# Patient Record
Sex: Male | Born: 1935
Health system: Southern US, Community
[De-identification: ages and names within clinical notes are randomized; demographics above are authoritative.]

## PROBLEM LIST (undated history)

## (undated) DIAGNOSIS — N200 Calculus of kidney: Secondary | ICD-10-CM

## (undated) DIAGNOSIS — C801 Malignant (primary) neoplasm, unspecified: Secondary | ICD-10-CM

## (undated) DIAGNOSIS — E119 Type 2 diabetes mellitus without complications: Secondary | ICD-10-CM

## (undated) DIAGNOSIS — I10 Essential (primary) hypertension: Secondary | ICD-10-CM

## (undated) DIAGNOSIS — I639 Cerebral infarction, unspecified: Secondary | ICD-10-CM

## (undated) HISTORY — DX: Calculus of kidney: N20.0

## (undated) HISTORY — PX: SHOULDER SURGERY: SHX246

## (undated) HISTORY — PX: CARDIAC CATHETERIZATION: SHX172

## (undated) HISTORY — PX: PROSTATE SURGERY: SHX751

## (undated) HISTORY — PX: OTHER SURGICAL HISTORY: SHX169

## (undated) HISTORY — PX: KNEE ARTHROSCOPY: SUR90

## (undated) HISTORY — DX: Type 2 diabetes mellitus without complications: E11.9

## (undated) HISTORY — DX: Cerebral infarction, unspecified: I63.9

---

## 1998-12-16 ENCOUNTER — Encounter: Payer: Self-pay | Admitting: Endocrinology

## 1998-12-16 ENCOUNTER — Ambulatory Visit (HOSPITAL_COMMUNITY): Admission: RE | Admit: 1998-12-16 | Discharge: 1998-12-16 | Payer: Self-pay | Admitting: Endocrinology

## 1999-05-26 ENCOUNTER — Encounter: Payer: Self-pay | Admitting: Endocrinology

## 1999-05-26 ENCOUNTER — Ambulatory Visit (HOSPITAL_COMMUNITY): Admission: RE | Admit: 1999-05-26 | Discharge: 1999-05-26 | Payer: Self-pay | Admitting: Endocrinology

## 1999-06-11 ENCOUNTER — Ambulatory Visit (HOSPITAL_COMMUNITY): Admission: RE | Admit: 1999-06-11 | Discharge: 1999-06-11 | Payer: Self-pay | Admitting: Endocrinology

## 1999-08-26 ENCOUNTER — Encounter: Admission: RE | Admit: 1999-08-26 | Discharge: 1999-11-24 | Payer: Self-pay | Admitting: Endocrinology

## 1999-09-26 ENCOUNTER — Ambulatory Visit (HOSPITAL_COMMUNITY): Admission: RE | Admit: 1999-09-26 | Discharge: 1999-09-26 | Payer: Self-pay | Admitting: Neurosurgery

## 1999-09-26 ENCOUNTER — Encounter: Payer: Self-pay | Admitting: Endocrinology

## 2002-05-22 ENCOUNTER — Encounter: Payer: Self-pay | Admitting: Urology

## 2002-05-22 ENCOUNTER — Encounter: Admission: RE | Admit: 2002-05-22 | Discharge: 2002-05-22 | Payer: Self-pay | Admitting: Urology

## 2003-03-12 ENCOUNTER — Encounter: Admission: RE | Admit: 2003-03-12 | Discharge: 2003-03-12 | Payer: Self-pay | Admitting: Orthopedic Surgery

## 2003-09-20 ENCOUNTER — Ambulatory Visit (HOSPITAL_COMMUNITY): Admission: RE | Admit: 2003-09-20 | Discharge: 2003-09-20 | Payer: Self-pay | Admitting: Cardiology

## 2004-12-08 ENCOUNTER — Encounter: Admission: RE | Admit: 2004-12-08 | Discharge: 2004-12-08 | Payer: Self-pay | Admitting: Endocrinology

## 2004-12-15 ENCOUNTER — Encounter: Admission: RE | Admit: 2004-12-15 | Discharge: 2004-12-15 | Payer: Self-pay | Admitting: Endocrinology

## 2004-12-23 ENCOUNTER — Encounter: Admission: RE | Admit: 2004-12-23 | Discharge: 2004-12-23 | Payer: Self-pay | Admitting: Endocrinology

## 2005-08-20 ENCOUNTER — Encounter: Admission: RE | Admit: 2005-08-20 | Discharge: 2005-08-20 | Payer: Self-pay | Admitting: Endocrinology

## 2006-08-12 ENCOUNTER — Encounter: Admission: RE | Admit: 2006-08-12 | Discharge: 2006-08-12 | Payer: Self-pay | Admitting: Endocrinology

## 2006-08-18 ENCOUNTER — Encounter: Admission: RE | Admit: 2006-08-18 | Discharge: 2006-08-18 | Payer: Self-pay | Admitting: Endocrinology

## 2006-12-21 ENCOUNTER — Ambulatory Visit (HOSPITAL_COMMUNITY): Admission: RE | Admit: 2006-12-21 | Discharge: 2006-12-21 | Payer: Self-pay | Admitting: *Deleted

## 2006-12-21 ENCOUNTER — Encounter (INDEPENDENT_AMBULATORY_CARE_PROVIDER_SITE_OTHER): Payer: Self-pay | Admitting: *Deleted

## 2008-03-16 ENCOUNTER — Ambulatory Visit (HOSPITAL_COMMUNITY): Admission: RE | Admit: 2008-03-16 | Discharge: 2008-03-16 | Payer: Self-pay | Admitting: Orthopedic Surgery

## 2008-03-26 ENCOUNTER — Ambulatory Visit (HOSPITAL_COMMUNITY): Admission: RE | Admit: 2008-03-26 | Discharge: 2008-03-26 | Payer: Self-pay | Admitting: Rheumatology

## 2008-04-16 ENCOUNTER — Encounter: Admission: RE | Admit: 2008-04-16 | Discharge: 2008-04-16 | Payer: Self-pay | Admitting: Endocrinology

## 2008-04-30 ENCOUNTER — Encounter: Admission: RE | Admit: 2008-04-30 | Discharge: 2008-04-30 | Payer: Self-pay | Admitting: Endocrinology

## 2008-05-18 ENCOUNTER — Ambulatory Visit (HOSPITAL_COMMUNITY): Admission: RE | Admit: 2008-05-18 | Discharge: 2008-05-18 | Payer: Self-pay | Admitting: *Deleted

## 2008-06-20 ENCOUNTER — Ambulatory Visit (HOSPITAL_BASED_OUTPATIENT_CLINIC_OR_DEPARTMENT_OTHER): Admission: RE | Admit: 2008-06-20 | Discharge: 2008-06-20 | Payer: Self-pay | Admitting: General Surgery

## 2008-06-20 ENCOUNTER — Encounter (INDEPENDENT_AMBULATORY_CARE_PROVIDER_SITE_OTHER): Payer: Self-pay | Admitting: General Surgery

## 2010-04-29 ENCOUNTER — Ambulatory Visit (HOSPITAL_COMMUNITY)
Admission: RE | Admit: 2010-04-29 | Discharge: 2010-04-30 | Payer: Self-pay | Source: Home / Self Care | Attending: Cardiology | Admitting: Cardiology

## 2010-05-22 ENCOUNTER — Encounter
Admission: RE | Admit: 2010-05-22 | Discharge: 2010-05-22 | Payer: Self-pay | Source: Home / Self Care | Attending: Gastroenterology | Admitting: Gastroenterology

## 2010-05-25 ENCOUNTER — Encounter: Payer: Self-pay | Admitting: Cardiology

## 2010-07-08 ENCOUNTER — Inpatient Hospital Stay (HOSPITAL_COMMUNITY): Payer: Medicare Other

## 2010-07-08 ENCOUNTER — Other Ambulatory Visit: Payer: Self-pay | Admitting: General Surgery

## 2010-07-08 ENCOUNTER — Observation Stay (HOSPITAL_COMMUNITY)
Admission: RE | Admit: 2010-07-08 | Discharge: 2010-07-08 | Disposition: A | Discharge status: Home / Self Care | Payer: Medicare Other | Source: Ambulatory Visit | Attending: General Surgery | Admitting: General Surgery

## 2010-07-08 DIAGNOSIS — I2510 Atherosclerotic heart disease of native coronary artery without angina pectoris: Secondary | ICD-10-CM | POA: Insufficient documentation

## 2010-07-08 DIAGNOSIS — R110 Nausea: Secondary | ICD-10-CM | POA: Insufficient documentation

## 2010-07-08 DIAGNOSIS — K219 Gastro-esophageal reflux disease without esophagitis: Secondary | ICD-10-CM | POA: Insufficient documentation

## 2010-07-08 DIAGNOSIS — E119 Type 2 diabetes mellitus without complications: Secondary | ICD-10-CM | POA: Insufficient documentation

## 2010-07-08 DIAGNOSIS — E7800 Pure hypercholesterolemia, unspecified: Secondary | ICD-10-CM | POA: Insufficient documentation

## 2010-07-08 DIAGNOSIS — I10 Essential (primary) hypertension: Secondary | ICD-10-CM | POA: Insufficient documentation

## 2010-07-08 DIAGNOSIS — K8010 Calculus of gallbladder with chronic cholecystitis without obstruction: Principal | ICD-10-CM | POA: Insufficient documentation

## 2010-07-08 LAB — CBC
MCV: 90.1 fL (ref 78.0–100.0)
RDW: 13.1 % (ref 11.5–15.5)
WBC: 7.7 10*3/uL (ref 4.0–10.5)

## 2010-07-08 LAB — PROTIME-INR
INR: 1.02 (ref 0.00–1.49)
Prothrombin Time: 13.6 seconds (ref 11.6–15.2)

## 2010-07-08 LAB — COMPREHENSIVE METABOLIC PANEL
AST: 15 U/L (ref 0–37)
Albumin: 4 g/dL (ref 3.5–5.2)
BUN: 20 mg/dL (ref 6–23)
GFR calc Af Amer: 50 mL/min — ABNORMAL LOW (ref >60)
GFR calc non Af Amer: 41 mL/min — ABNORMAL LOW (ref >60)
Glucose, Bld: 152 mg/dL — ABNORMAL HIGH (ref 70–99)
Potassium: 5.2 mEq/L — ABNORMAL HIGH (ref 3.5–5.1)
Sodium: 141 mEq/L (ref 135–145)
Total Bilirubin: 0.8 mg/dL (ref 0.3–1.2)

## 2010-07-08 LAB — DIFFERENTIAL
Basophils Absolute: 0 10*3/uL (ref 0.0–0.1)
Basophils Relative: 0 % (ref 0–1)
Eosinophils Absolute: 0.3 10*3/uL (ref 0.0–0.7)
Eosinophils Relative: 3 % (ref 0–5)
Lymphs Abs: 1.7 10*3/uL (ref 0.7–4.0)

## 2010-07-08 LAB — TYPE AND SCREEN: Antibody Screen: NEGATIVE

## 2010-07-08 LAB — GLUCOSE, CAPILLARY

## 2010-07-08 LAB — SURGICAL PCR SCREEN: Staphylococcus aureus: NEGATIVE

## 2010-07-10 NOTE — Op Note (Signed)
NAME:  Tyler [name], Tyler NO.:  [id number]  MEDICAL RECORD NO.:  [id number]           PATIENT TYPE:  I  LOCATION:  2550                         FACILITY:  [facility]  PHYSICIAN:  Almond Lint, MD       DATE OF BIRTH:  [birth year]  DATE OF PROCEDURE:  07/08/2010 DATE OF DISCHARGE:  07/08/2010                              OPERATIVE REPORT   PREOPERATIVE DIAGNOSIS:  Symptomatic cholelithiasis.  POSTOPERATIVE DIAGNOSIS:  Symptomatic cholelithiasis  PROCEDURE PERFORMED:  Laparoscopic cholecystectomy.  SURGEON:  Almond Lint, MD  ASSISTANT:  None.  ANESTHESIA:  General and local.  FINDINGS:  Mild inflammation of gallbladder and distended gallbladder.  SPECIMEN:  Gallbladder to Pathology.  ESTIMATED BLOOD LOSS:  Minimal.  COMPLICATIONS:  None known.  DESCRIPTION OF PROCEDURE:  Mr. Tyler [name] was identified in the holding area and taken to operating room where he was placed supine on the operating table.  General anesthesia was induced.  His abdomen was clipped, prepped, and draped in sterile fashion.  Time-out was performed according to surgical safety check list.  When all was correct, we continued.  The infraumbilical skin was anesthetized with local anesthetic.  A vertical incision was made with #11 blade approximately 1.5 cm in length.  The subcutaneous tissues were spread with Tresa Endo and the umbilical fascia was elevated with 2 Kocher clamps.  This was incised in the midline.  A 0 Vicryl pursestring suture was placed around the fascial incision and the tails were used to hold Hasson trocar in place.  Pneumoperitoneum was achieved to a pressure of 15 mmHg.  The patient was placed into reverse Trendelenburg position and rotated to the left.  Under direct visualization after administration of local, the 11-mm trocar was placed in the epigastrium and two 5-mm trocars were placed in the right upper quadrant.  Gallbladder fundus was elevated toward the head and the  infundibulum was retracted laterally.  The [place] dissector was used to skeletonize the cystic duct and cystic artery.  The hook was used to open up some of peritoneal attachments demonstrating a critical view.  The cystic duct was triply clipped on the patient's side and singly clipped on the specimen side.  The cystic artery was doubly clipped on the patient's side and singly clipped on the specimen side.  There was an additional a very small venous- appearing branch which was clipped on the patient's side.  The hook cautery was then used to the gallbladder with the gallbladder fossa. The edema plane was such that the gallbladder with minimal retraction pulled off the liver bed.  Once the gallbladder was fully off the liver bed, the camera was moved to the epigastric port and the gallbladder was retrieved through the umbilical port was an EndoCatch bag.  The skin and fascial incisions had to be extended somewhat to be able to remove the gallbladder without the sac breaking.  Once this was accomplished, the Hasson trocar was reintroduced and pneumoperitoneum was retrieved.  The gallbladder fossa was examined and there was a small amount of oozing from the portion where the gallbladder had pulled off directly from the  liver.  This was coagulated with cautery.  The area was copiously irrigated and inspected meticulously for hemostasis.  Once the hemostasis had been achieved, this was re-irrigated and reinspected.  At this point, a 4-quadrant inspection was performed demonstrating minimal adhesions in the abdomen and no gross pathology.  There was no pooling of blood or significant amounts of irrigation in the remaining abdomen. Following this, the epigastric and right upper quadrant ports were removed under direct visualization without evidence of bleeding from the abdominal wall.  The pneumoperitoneum was allowed to fully evacuate through the Hasson.  The Hasson was then removed and the  pursestring suture was tied down at the umbilicus.  There was no residual palpable fascial defect.  The skin of all incisions was closed using 4-0 Monocryl in a running subcuticular fashion.  The wounds were then cleaned, dried, and dressed with Dermabond.  The patient was awakened from anesthesia and taken to PACU in stable condition.  Needle and sponge counts were correct.     Almond Lint, MD     FB/MEDQ  D:  07/08/2010  T:  07/09/2010  Job:  [id number]  cc:   Brooke Bonito, M.D.  Electronically Signed by Almond Lint MD on 07/09/2010 01:22:00 PM

## 2010-07-14 LAB — BASIC METABOLIC PANEL
BUN: 27 mg/dL — ABNORMAL HIGH (ref 6–23)
GFR calc non Af Amer: 39 mL/min — ABNORMAL LOW (ref >60)
GFR calc non Af Amer: 43 mL/min — ABNORMAL LOW (ref >60)
Glucose, Bld: 164 mg/dL — ABNORMAL HIGH (ref 70–99)
Glucose, Bld: 186 mg/dL — ABNORMAL HIGH (ref 70–99)
Potassium: 4.6 mEq/L (ref 3.5–5.1)
Potassium: 5.3 mEq/L — ABNORMAL HIGH (ref 3.5–5.1)
Sodium: 136 mEq/L (ref 135–145)

## 2010-07-14 LAB — GLUCOSE, CAPILLARY
Glucose-Capillary: 132 mg/dL — ABNORMAL HIGH (ref 70–99)
Glucose-Capillary: 135 mg/dL — ABNORMAL HIGH (ref 70–99)

## 2010-08-05 NOTE — Discharge Summary (Signed)
  NAME:  ACESON, LABELL NO.:  [id number]  MEDICAL RECORD NO.:  [id number]           PATIENT TYPE:  I  LOCATION:  2550                         FACILITY:  [facility]  PHYSICIAN:  Almond Lint, MD       DATE OF BIRTH:  [birth year]  DATE OF ADMISSION:  07/08/2010 DATE OF DISCHARGE:  07/08/2010                              DISCHARGE SUMMARY   PRINCIPAL DIAGNOSES:  Cholelithiasis and chronic cholecystitis.  PRINCIPAL PROCEDURE:  Laparoscopic cholecystectomy on July 08, 2010.  SECONDARY DIAGNOSES: 1. Diabetes. 2. Reflux. 3. Hypertension. 4. Hypercholesterolemia.  DISCHARGE MEDICATIONS: 1. Glimepiride 2 mg once a day. 2. Flaxseed oil 1000 mg once a day. 3. Acetaminophen p.r.n. 4. Vitamin B12 once a day. 5. Aspirin 81 mg once a day. 6. Vitamin D3 1000 units once a day. 7. Losartan 50 mg b.i.d. 8. Protonix 40 mg once a day. 9. Pravastatin 80 mg once a day. 10.Fish oil 1200 mg twice a day. 11.Percocet 5/325 one to two tablets p.o. q.4 hours p.r.n. pain.  LABORATORY DATA:  His labs on admission, CBC was normal.  CMET showed potassium of 5.2 and glucose of 152, creatinine of 1.63.  We did not get any followup labs on him.  HOSPITAL COURSE:  Tyler [name] was admitted to the hospital for nausea following his laparoscopic cholecystectomy.  He did very well the next morning and he is able to tolerated oral pain medications and oral narcotics.  He is able to void spontaneously.  He is discharged to home in improved condition.    Almond Lint, MD    FB/MEDQ  D:  07/18/2010  T:  07/19/2010  Job:  [id number]  Electronically Signed by Almond Lint MD on 08/05/2010 10:15:01 AM

## 2010-08-18 LAB — GLUCOSE, CAPILLARY: Glucose-Capillary: 134 mg/dL — ABNORMAL HIGH (ref 70–99)

## 2010-09-16 NOTE — Op Note (Signed)
NAME:  AMY, BELLOSO NO.:  [id number]   MEDICAL RECORD NO.:  [id number]          PATIENT TYPE:  AMB   LOCATION:  SDS                          FACILITY:  [facility]   PHYSICIAN:  Almedia Balls. Ranell Patrick, M.D. DATE OF BIRTH:  1928/06/05   DATE OF PROCEDURE:  03/16/2008  DATE OF DISCHARGE:  03/16/2008                               OPERATIVE REPORT   PREOPERATIVE DIAGNOSES:  1. Right shoulder rotator cuff tear.  2. Superior labrum anterior and posterior lesion.  3. Acromioclavicular joint arthritis.   POSTOPERATIVE DIAGNOSES:  1. Right shoulder rotator cuff tear.  2. Superior labrum anterior and posterior lesion.  3. Acromioclavicular joint arthritis.   PROCEDURE PERFORMED:  Right shoulder arthroscopy with extensive  debridement of superior labrum anterior and posterior lesion,  arthroscopic biceps tenotomy, arthroscopic cervical decompression, mini  open rotator cuff repair, open biceps tenodesis using Panalok anchor,  open distal clavicle excision.   ATTENDING SURGEON:  Almedia Balls. Ranell Patrick, MD   ASSISTANT:  Donnie Coffin. Dixon, PA-C   ANESTHESIA:  General anesthesia plus interscalene block anesthesia was  used.   ESTIMATED BLOOD LOSS:  Minimal.   FLUID REPLACEMENT:  1200 mL crystalloid.   INSTRUMENT COUNT:  Correct.   COMPLICATIONS:  None.   Preoperative antibiotics were given.   INDICATIONS:  The patient is a 77 year old male with a history of right  shoulder rotator cuff tear.  He had impingement as well as symptomatic  AC joint arthritis with progressive pain and functional loss.  The  patient presents now for operative treatment desiring a shoulder surgery  to restore function and eliminate pain.  Informed consent was obtained.   DESCRIPTION OF PROCEDURE:  After an adequate level of anesthesia was  achieved, the patient was positioned in the modified beach chair  position.  The right shoulder was sterilely prepped and draped in the  usual manner.  We entered  the shoulder arthroscopically using standard  arthroscopic  portals including anterior, posterior, and lateral  portals.  We identified a torn superior labrum, anterior to posterior  with unstable biceps anchor.  We performed a labral debridement using  basket forceps and a motorized shaver.  The patient had a Buford complex  as well.  We debrided back stable labral tissue.  The biceps was  tenotomized using basket forceps.  The articular surface on the glenoid  and humeral head showed minimal grade 1 and grade 2 chondromalacia.  The  subscap was normal.  The rotator cuff had an undersurface tear.  Less  than 25% of thickness to the tendon of supraspinatus only, performed a  debridement of that using motorized shaver.  Infraspinatus and teres  minor were normal.  Posterior labrum intact.  Except for the very  posteroinferior portion of the labrum, which had a degenerative  nondisplaced tear, which we shaved using the shaver.  We evacuated all  the loose cartilage fragments.  We placed the scope in subacromial  space, performed bursectomy and acromioplasty creating a type 1 acromial  shape with thorough decompression of the rotator cuff outlet.  The  rotator cuff appeared  fairly normal from the bursal surface.  At this  point, we made a saber incision overlying the AC joint.  Dissection  carried sharply down through the subcutaneous tissues.  We identified  the deltoid-trapezius fascia, split that line with a distal clavicle  followed by subperiosteal dissection of distal clavicle.  We excised the  distal 4 mm of the clavicle using an oscillating saw.  Thoroughly  irrigated, applied bone wax at the cut into clavicle and then repaired  the deltoid-trapezius fascia using 0 Vicryl figure-of-eight sutures  followed by 2-0 Vicryl subcutaneous closure with 4-0 Monocryl for skin.  We then made a small mini open incisions starting at the anterolateral  border of the acromion extending distally for  about 3-4 cm.  Dissection  carried sharply down through the subcutaneous tissues.  Split the  deltoid in its raphe between the anterior and lateral heads.  We  identified the bicipital groove, incising soft tissue overlying the  groove, delivered the tendon into the wound.  Identified the tenodesis  point in the bicipital groove and then whip stitched the biceps with #2  FiberWire suture.  We then placed a single 3.5 Panalok anchoring the  floor of the bicipital groove.  Brought the sutures up through the whip  stitched area with #2 FiberWire and biceps tendon.  We tied the tendon  down in the groove.  We then went and clipped the remaining biceps  tendon off and then took a 0 Vicryl on a CT-2 needle and then oversewed  the soft tissue over the top of the biceps tenodesis.  This was a nice  low profile repair.  We did that with elbow at mid tension with the  elbow at 90 degrees.  We then went ahead and approached the rotator  cuff.  We noted there would be a definite defect in the posterior aspect  of the supraspinatus tendon, made a longitudinal incision in the tendon.  Identified immediately degenerative area of the tendon with nonhealthy-  looking tissue.  We removed all that tissue and then did a side-to-side  rotator cuff repair with a single 5.5 Arthrex Bio-Corkscrew anchor at  the articular margin followed by side-to-side #2 FiberWire suture and  some side-to-side 0 Vicryl figure-of-eight at the most lateral margin.  We had a nice low profile repair and again this was non-impinging with  the acromion, took this through full range of motion.  We thoroughly  irrigated the repair site and repaired the deltoid with 0 Vicryl suture  side-to-side followed by 2-0 Vicryl subcutaneous closure with 4-0  Monocryl for skin.  Steri-Strips were applied followed by a sterile  dressing.  The patient tolerated the surgery well.      Almedia Balls. Ranell Patrick, M.D.  Electronically Signed     SRN/MEDQ   D:  03/16/2008  T:  03/17/2008  Job:  [id number]

## 2010-09-16 NOTE — Op Note (Signed)
NAME:  Tyler [name], Tyler [name] NO.:  [id number]   MEDICAL RECORD NO.:  [id number]          PATIENT TYPE:  AMB   LOCATION:  ENDO                         FACILITY:  [facility]   PHYSICIAN:  Georgiana Spinner, M.D.    DATE OF BIRTH:  05/27/1928   DATE OF PROCEDURE:  12/21/2006  DATE OF DISCHARGE:                               OPERATIVE REPORT   PROCEDURE:  Colonoscopy.   INDICATIONS:  Colon cancer screening, colon polyps.   ANESTHESIA:  Fentanyl 25 mcg, Versed 2.5 mg.   PROCEDURE:  With the patient mildly sedated in the left lateral  decubitus position, the Pentax videoscopic colonoscope was inserted into  the rectum and passed under direct vision to the cecum identified by  ileocecal valve and appendiceal orifice, both of which were  photographed.  From this point, the colonoscope was slowly withdrawn,  taking circumferential views of colonic mucosa, as we withdrew all the  way to the rectum, stopping first at the ileocecal valve area where a  small polyp was seen, photographed and removed using hot biopsy forceps  technique setting of 20/150 blended current.  The endoscope was then  withdrawn all the way to the splenic flexure where a second polyp was  seen.  It too was photographed, and it too was removed using hot biopsy  forceps technique setting of 20/150 blended current.  We then withdrew  all the way to the rectum which appeared normal on direct and showed  hemorrhoids on retroflexed view.  The endoscope was straightened and  withdrawn.  The patient's vital signs and pulse oximeter remained  stable.  The patient tolerated the procedure well without apparent  complications.   FINDINGS:  Polyp of the ileocecal valve area and splenic flexure area,  biopsied and removed with hot biopsy forceps technique.  Internal  hemorrhoids.  Await biopsy report.  The patient will call me for results  and follow-up with me as an outpatient.           ______________________________  Georgiana Spinner, M.D.     GMO/MEDQ  D:  12/21/2006  T:  12/21/2006  Job:  [id number]

## 2010-09-16 NOTE — Op Note (Signed)
NAME:  Tyler [name], Tyler [name] NO.:  [id number]   MEDICAL RECORD NO.:  [id number]          PATIENT TYPE:  AMB   LOCATION:  ENDO                         FACILITY:  [facility]   PHYSICIAN:  Georgiana Spinner, M.D.    DATE OF BIRTH:  1928/09/18   DATE OF PROCEDURE:  DATE OF DISCHARGE:                               OPERATIVE REPORT   PROCEDURE:  Upper endoscopy.   INDICATIONS:  Weight loss.   ANESTHESIA:  Fentanyl 50 mcg, Versed 5 mg.   DESCRIPTION OF PROCEDURE:  With the patient mildly sedated in the left  lateral decubitus position, the Pentax videoscopic endoscope was  inserted in the mouth and passed under direct vision through the  esophagus which appeared normal into the stomach fundus, body, antrum,  duodenal bulb, second portion of duodenum all appeared normal.  From  this point, the endoscope was slowly withdrawn taking circumferential  views of duodenal mucosa until the endoscope had been pulled back into  stomach and placed in retroflexion to view the stomach from below, and  the scope was straightened and withdrawn taking circumferential views of  remaining gastric and esophageal mucosa.  The patient's vital signs and  pulse oximeter remained stable.  The patient tolerated procedure well  without apparent complication.   FINDINGS:  Unremarkable examination.   PLAN:  Will have patient follow-up with me as an outpatient.           ______________________________  Georgiana Spinner, M.D.     GMO/MEDQ  D:  05/18/2008  T:  05/18/2008  Job:  [id number]

## 2010-09-16 NOTE — Op Note (Signed)
NAME:  Tyler [name], Tyler [name] NO.:  [id number]   MEDICAL RECORD NO.:  [id number]          PATIENT TYPE:  AMB   LOCATION:  ENDO                         FACILITY:  [facility]   PHYSICIAN:  Georgiana Spinner, M.D.    DATE OF BIRTH:  1928-10-09   DATE OF PROCEDURE:  12/21/2006  DATE OF DISCHARGE:                               OPERATIVE REPORT   PROCEDURE:  Upper endoscopy.   INDICATIONS:  Abdominal pain.   Fentanyl 50 mcg, Versed 5 mg.   PROCEDURE:  With the patient mildly sedated in the left lateral  decubitus position, the Pentax videoscopic endoscope was inserted and  passed under direct vision through the esophagus, which appeared normal,  into the stomach.  Fundus, body, antrum, duodenal bulb, second portion  of duodenum all appeared normal.  From this point the endoscope was  slowly withdrawn taking circumferential views of the duodenal mucosa  until the endoscope had been pulled back into the stomach, placed in  retroflexion to view the stomach from below.  The endoscope was  straightened and withdrawn taking circumferential views of the remaining  gastric and esophageal mucosa.  The patient's vital signs and pulse  oximeter remained stable.  The patient tolerated the procedure well  without apparent complications.   FINDINGS:  Unremarkable examination.   PLAN:  Proceed to colonoscopy.           ______________________________  Georgiana Spinner, M.D.     GMO/MEDQ  D:  12/21/2006  T:  12/21/2006  Job:  [id number]

## 2011-02-03 LAB — GLUCOSE, CAPILLARY: Glucose-Capillary: 214 — ABNORMAL HIGH

## 2011-02-03 LAB — URINALYSIS, ROUTINE W REFLEX MICROSCOPIC
Glucose, UA: 250 — AB
Nitrite: NEGATIVE
Protein, ur: NEGATIVE
pH: 5

## 2011-02-03 LAB — BASIC METABOLIC PANEL
GFR calc Af Amer: >60
GFR calc non Af Amer: >60
Potassium: 3.9
Sodium: 137

## 2011-02-03 LAB — CBC
HCT: 36.8 — ABNORMAL LOW
Hemoglobin: 12.6 — ABNORMAL LOW
RBC: 4.01 — ABNORMAL LOW
WBC: 14.8 — ABNORMAL HIGH

## 2011-02-03 LAB — DIFFERENTIAL
Eosinophils Relative: 1
Lymphocytes Relative: 19
Lymphs Abs: 2.8
Monocytes Absolute: 0.7

## 2011-02-03 LAB — APTT: aPTT: 21 — ABNORMAL LOW

## 2012-01-19 ENCOUNTER — Other Ambulatory Visit: Payer: Self-pay | Admitting: Dermatology

## 2012-01-28 ENCOUNTER — Inpatient Hospital Stay (HOSPITAL_COMMUNITY)
Admission: EM | Admit: 2012-01-28 | Discharge: 2012-02-02 | DRG: 065 | Disposition: A | Discharge status: Rehabilitation Facility | Payer: Medicare Other | Attending: Internal Medicine | Admitting: Internal Medicine

## 2012-01-28 ENCOUNTER — Encounter (HOSPITAL_COMMUNITY): Payer: Self-pay | Admitting: Emergency Medicine

## 2012-01-28 ENCOUNTER — Emergency Department (HOSPITAL_COMMUNITY): Payer: Medicare Other

## 2012-01-28 DIAGNOSIS — Z8673 Personal history of transient ischemic attack (TIA), and cerebral infarction without residual deficits: Secondary | ICD-10-CM | POA: Insufficient documentation

## 2012-01-28 DIAGNOSIS — I498 Other specified cardiac arrhythmias: Secondary | ICD-10-CM | POA: Diagnosis not present

## 2012-01-28 DIAGNOSIS — E119 Type 2 diabetes mellitus without complications: Secondary | ICD-10-CM | POA: Diagnosis present

## 2012-01-28 DIAGNOSIS — Z23 Encounter for immunization: Secondary | ICD-10-CM

## 2012-01-28 DIAGNOSIS — E785 Hyperlipidemia, unspecified: Secondary | ICD-10-CM | POA: Diagnosis present

## 2012-01-28 DIAGNOSIS — Z87891 Personal history of nicotine dependence: Secondary | ICD-10-CM

## 2012-01-28 DIAGNOSIS — Z7902 Long term (current) use of antithrombotics/antiplatelets: Secondary | ICD-10-CM

## 2012-01-28 DIAGNOSIS — E7800 Pure hypercholesterolemia, unspecified: Secondary | ICD-10-CM | POA: Diagnosis present

## 2012-01-28 DIAGNOSIS — I6340 Cerebral infarction due to embolism of unspecified cerebral artery: Principal | ICD-10-CM | POA: Diagnosis present

## 2012-01-28 DIAGNOSIS — I509 Heart failure, unspecified: Secondary | ICD-10-CM | POA: Diagnosis present

## 2012-01-28 DIAGNOSIS — N184 Chronic kidney disease, stage 4 (severe): Secondary | ICD-10-CM | POA: Diagnosis present

## 2012-01-28 DIAGNOSIS — K5900 Constipation, unspecified: Secondary | ICD-10-CM | POA: Diagnosis not present

## 2012-01-28 DIAGNOSIS — I5032 Chronic diastolic (congestive) heart failure: Secondary | ICD-10-CM | POA: Diagnosis present

## 2012-01-28 DIAGNOSIS — I129 Hypertensive chronic kidney disease with stage 1 through stage 4 chronic kidney disease, or unspecified chronic kidney disease: Secondary | ICD-10-CM | POA: Diagnosis present

## 2012-01-28 DIAGNOSIS — G8190 Hemiplegia, unspecified affecting unspecified side: Secondary | ICD-10-CM | POA: Diagnosis present

## 2012-01-28 DIAGNOSIS — Z79899 Other long term (current) drug therapy: Secondary | ICD-10-CM

## 2012-01-28 DIAGNOSIS — R4701 Aphasia: Secondary | ICD-10-CM | POA: Diagnosis present

## 2012-01-28 DIAGNOSIS — Z882 Allergy status to sulfonamides status: Secondary | ICD-10-CM

## 2012-01-28 DIAGNOSIS — Z8546 Personal history of malignant neoplasm of prostate: Secondary | ICD-10-CM

## 2012-01-28 DIAGNOSIS — R479 Unspecified speech disturbances: Secondary | ICD-10-CM

## 2012-01-28 DIAGNOSIS — I639 Cerebral infarction, unspecified: Secondary | ICD-10-CM

## 2012-01-28 HISTORY — DX: Essential (primary) hypertension: I10

## 2012-01-28 HISTORY — DX: Malignant (primary) neoplasm, unspecified: C80.1

## 2012-01-28 LAB — COMPREHENSIVE METABOLIC PANEL
Alkaline Phosphatase: 117 U/L (ref 39–117)
BUN: 26 mg/dL — ABNORMAL HIGH (ref 6–23)
GFR calc Af Amer: 45 mL/min — ABNORMAL LOW (ref >90)
GFR calc non Af Amer: 38 mL/min — ABNORMAL LOW (ref >90)
Glucose, Bld: 232 mg/dL — ABNORMAL HIGH (ref 70–99)
Potassium: 4.1 mEq/L (ref 3.5–5.1)
Total Protein: 6.9 g/dL (ref 6.0–8.3)

## 2012-01-28 LAB — DIFFERENTIAL
Eosinophils Absolute: 0.4 10*3/uL (ref 0.0–0.7)
Eosinophils Relative: 5 % (ref 0–5)
Lymphs Abs: 3.2 10*3/uL (ref 0.7–4.0)
Monocytes Relative: 6 % (ref 3–12)
Neutrophils Relative %: 50 % (ref 43–77)

## 2012-01-28 LAB — GLUCOSE, CAPILLARY

## 2012-01-28 LAB — CBC
Hemoglobin: 13.4 g/dL (ref 13.0–17.0)
MCH: 31.4 pg (ref 26.0–34.0)
MCV: 91.3 fL (ref 78.0–100.0)
RBC: 4.27 MIL/uL (ref 4.22–5.81)

## 2012-01-28 LAB — TROPONIN I: Troponin I: <0.3 ng/mL (ref <0.30)

## 2012-01-28 MED ORDER — SIMVASTATIN 5 MG PO TABS
5.0000 mg | ORAL_TABLET | Freq: Every day | ORAL | Status: DC
  Administered 2012-01-29 – 2012-02-01 (×4): 5 mg via ORAL
  Filled 2012-01-28 (×5): qty 1

## 2012-01-28 MED ORDER — PANTOPRAZOLE SODIUM 40 MG PO TBEC
40.0000 mg | DELAYED_RELEASE_TABLET | Freq: Every day | ORAL | Status: DC
  Administered 2012-01-29 – 2012-02-02 (×5): 40 mg via ORAL
  Filled 2012-01-28 (×6): qty 1

## 2012-01-28 MED ORDER — INFLUENZA VIRUS VACC SPLIT PF IM SUSP
0.5000 mL | INTRAMUSCULAR | Status: AC
  Administered 2012-01-29: 0.5 mL via INTRAMUSCULAR
  Filled 2012-01-28: qty 0.5

## 2012-01-28 MED ORDER — SENNOSIDES-DOCUSATE SODIUM 8.6-50 MG PO TABS: 1.0000 | ORAL_TABLET | Freq: Every evening | ORAL | Status: DC | PRN

## 2012-01-28 MED ORDER — ASPIRIN 300 MG RE SUPP
300.0000 mg | Freq: Every day | RECTAL | Status: DC
  Filled 2012-01-28: qty 1

## 2012-01-28 MED ORDER — OMEGA-3-ACID ETHYL ESTERS 1 G PO CAPS
1.0000 g | ORAL_CAPSULE | Freq: Every day | ORAL | Status: DC
  Administered 2012-01-29 – 2012-02-02 (×5): 1 g via ORAL
  Filled 2012-01-28 (×5): qty 1

## 2012-01-28 MED ORDER — ENOXAPARIN SODIUM 40 MG/0.4ML ~~LOC~~ SOLN
40.0000 mg | SUBCUTANEOUS | Status: DC
Start: 2012-01-29 — End: 2012-01-31
  Administered 2012-01-29 – 2012-01-30 (×2): 40 mg via SUBCUTANEOUS
  Filled 2012-01-28 (×4): qty 0.4

## 2012-01-28 MED ORDER — LOSARTAN POTASSIUM 50 MG PO TABS
50.0000 mg | ORAL_TABLET | Freq: Every day | ORAL | Status: DC
  Administered 2012-01-29: 50 mg via ORAL
  Filled 2012-01-28: qty 1

## 2012-01-28 MED ORDER — ASPIRIN 325 MG PO TABS
325.0000 mg | ORAL_TABLET | Freq: Every day | ORAL | Status: DC
  Administered 2012-01-29: 325 mg via ORAL
  Filled 2012-01-28: qty 1

## 2012-01-28 MED ORDER — INSULIN ASPART 100 UNIT/ML ~~LOC~~ SOLN
0.0000 [IU] | Freq: Three times a day (TID) | SUBCUTANEOUS | Status: DC
  Administered 2012-01-29 – 2012-01-30 (×2): 1 [IU] via SUBCUTANEOUS
  Administered 2012-01-30: 2 [IU] via SUBCUTANEOUS
  Administered 2012-01-31 (×2): 1 [IU] via SUBCUTANEOUS
  Administered 2012-02-01: 2 [IU] via SUBCUTANEOUS
  Administered 2012-02-01 – 2012-02-02 (×2): 1 [IU] via SUBCUTANEOUS
  Administered 2012-02-02: 2 [IU] via SUBCUTANEOUS

## 2012-01-28 MED ORDER — GLIMEPIRIDE 4 MG PO TABS
4.0000 mg | ORAL_TABLET | Freq: Every day | ORAL | Status: DC
  Administered 2012-01-29 – 2012-02-02 (×5): 4 mg via ORAL
  Filled 2012-01-28 (×6): qty 1

## 2012-01-28 NOTE — ED Provider Notes (Signed)
78 year old male had onset about 1715 difficulty speaking and her right facial droop. He does have history of prior stroke. On exam, he has mild right facial droop, mildly dysarthric speech, and weakness of his right leg with strength 4/5. There is no arm weakness identified and no pronator drift. He has been seen in conjunction with the neurology who will decide whether he should receive thrombolytic treatment.   Date: 01/29/2012  Rate: 75  Rhythm: normal sinus rhythm  QRS Axis: left  Intervals: PR prolonged  ST/T Wave abnormalities: normal  Conduction Disutrbances:first-degree A-V block  and left anterior fascicular block  Narrative Interpretation: Left axis deviation, left anterior fascicular block. Compared with ECG of 04/30/2010, no significant changes are seen.  Old EKG Reviewed: unchanged CRITICAL CARE Performed by: [email or web address]   Total critical care time: 60 minutes  Critical care time was exclusive of separately billable procedures and treating other patients.  Critical care was necessary to treat or prevent imminent or life-threatening deterioration.  Critical care was time spent personally by me on the following activities: development of treatment plan with patient and/or surrogate as well as nursing, discussions with consultants, evaluation of patient's response to treatment, examination of patient, obtaining history from patient or surrogate, ordering and performing treatments and interventions, ordering and review of laboratory studies, ordering and review of radiographic studies, pulse oximetry and re-evaluation of patient's condition.  I saw and evaluated the patient, reviewed the resident's note and I agree with the findings and plan.   Dione Booze, MD 01/29/12 Jacinta Shoe

## 2012-01-28 NOTE — Code Documentation (Signed)
Called to ED for Code Stroke.  78 year old patient who wife reports was normal at baseline and suddenly at 1730 developed slurred speech and right side weakness.  Patient was transported to hospital via EMS.  See ED notes for Code Stroke log numbers.  On arrival patient was alert and NAD.   NIHSS 2 with right facial droop and mild dysarthria.  States his right arm feels different but does not endorse numbness and motor exam is negative.  Patient is s/p basal cell carcinoma removal right lower leg last week - wife reports MD made 2 "passes" at the site.  DDI to site.  BP on arrival was 207/89 - lowered to 185/80 after few minutes.  Dr. Eilleen Kempf and Dr. Thad Ranger at bedside.  No tpa at this time- still have window to 2030.  Handoff to April, RN RRT night nurse and ED RN.

## 2012-01-28 NOTE — Consult Note (Signed)
Referring Physician: Preston Fleeting     Chief Complaint: Slurred  speech  HPI: HAI GRABE is an 78 y.o. male who was in his normal state of health until about 1730 when he was noted to have slurred speech.  At that time also complained that his right arm did not feel right. Was felt to have right upper extremity numbness and weakness.  EMS was called at that time and the patient was brought in as a code stroke.  On presentation the RUE symptoms had resolved but he continued to have slurred speech.    LSN: [id number] tPA Given: No: Minimal disability on exam  Past Medical History  Diagnosis Date  . Hypertension   . Cancer    - DM  - Hypercholesterolemia  - Reflux  Past Surgical History  Procedure Date  . Prostate surgery    - Surgery last week on LLE for basal cell carcinoma  History reviewed. No pertinent family history.  Social History:  reports that he quit smoking about 43 years ago. He does not have any smokeless tobacco history on file. He reports that he does not drink alcohol or use illicit drugs.  Allergies:  Allergies  Allergen Reactions  . Sulfa Drugs Cross Reactors Rash    Medications: I have reviewed the patient's current medications. Prior to Admission:  ASA, Vitamin D, Vitamin B12, Fish Oil, Flaxseed, Amaryl, Cozaar, Protonix, Pravachol No current outpatient prescriptions on file.  ROS: History obtained from the patient  General ROS: negative for - chills, fatigue, fever, night sweats, weight gain or weight loss Psychological ROS: negative for - behavioral disorder, hallucinations, memory difficulties, mood swings or suicidal ideation Ophthalmic ROS: negative for - blurry vision, double vision, eye pain or loss of vision ENT ROS: negative for - epistaxis, nasal discharge, oral lesions, sore throat, tinnitus or vertigo Allergy and Immunology ROS: negative for - hives or itchy/watery eyes Hematological and Lymphatic ROS: negative for - bleeding problems, bruising or  swollen lymph nodes Endocrine ROS: negative for - galactorrhea, hair pattern changes, polydipsia/polyuria or temperature intolerance Respiratory ROS: negative for - cough, hemoptysis, shortness of breath or wheezing Cardiovascular ROS: negative for - chest pain, dyspnea on exertion, edema or irregular heartbeat Gastrointestinal ROS: negative for - abdominal pain, diarrhea, hematemesis, nausea/vomiting or stool incontinence Genito-Urinary ROS: negative for - dysuria, hematuria, incontinence or urinary frequency/urgency Musculoskeletal ROS: negative for - joint swelling or muscular weakness Neurological ROS: as noted in HPI Dermatological ROS: negative for rash and skin lesion changes  Physical Examination: Blood pressure 180/75, pulse 77, temperature 98.1 F (36.7 C), temperature source Oral, resp. rate 16, SpO2 100.00%.  Neurologic Examination: Mental Status: Alert, oriented, thought content appropriate.  Speech fluent without evidence of aphasia.  Able to follow 3 step commands without difficulty. Cranial Nerves: II: Discs flat bilaterally; Visual fields grossly normal, pupils equal, round, reactive to light and accommodation III,IV, VI: ptosis not present, extra-ocular motions intact bilaterally V,VII: right facial droop, facial light touch sensation normal bilaterally VIII: hearing normal bilaterally IX,X: gag reflex present XI: bilateral shoulder shrug XII: midline tongue extension Motor: Right : Upper extremity   5/5 with drift  Left:     Upper extremity   5/5  Lower extremity   5/5     Lower extremity   5/5 Tone and bulk:normal tone throughout; no atrophy noted Sensory: Pinprick and light touch intact throughout, bilaterally Deep Tendon Reflexes: 2+ and symmetric in the upper extremities, 2+ at the right KJ, trace left KJ and  absent AJ's bilaterally Plantars: Right: upgoing   Left: downgoing Cerebellar: normal finger-to-nose, normal rapid alternating movements and normal  heel-to-shin test Gait: unable to test CV: pulses palpable throughout     Laboratory Studies:  Basic Metabolic Panel:  Lab 01/28/12 4098  NA 137  K 4.1  CL 103  CO2 22  GLUCOSE 232*  BUN 26*  CREATININE 1.61*  CALCIUM 9.5  MG --  PHOS --    Liver Function Tests:  Lab 01/28/12 1830  AST 16  ALT 16  ALKPHOS 117  BILITOT 0.3  PROT 6.9  ALBUMIN 3.8   No results found for this basename: LIPASE:5,AMYLASE:5 in the last 168 hours No results found for this basename: AMMONIA:3 in the last 168 hours  CBC:  Lab 01/28/12 1830  WBC 8.2  NEUTROABS 4.1  HGB 13.4  HCT 39.0  MCV 91.3  PLT 154    Cardiac Enzymes:  Lab 01/28/12 1830  CKTOTAL --  CKMB --  CKMBINDEX --  TROPONINI <0.30    BNP: No components found with this basename: POCBNP:5  CBG:  Lab 01/28/12 1850  GLUCAP 201*    Microbiology: Results for orders placed during the hospital encounter of 07/08/10  SURGICAL PCR SCREEN     Status: Normal   Collection Time   07/08/10  9:54 AM      Component Value Range Status Comment   MRSA, PCR NEGATIVE  NEGATIVE Final    Staphylococcus aureus    NEGATIVE Final    Value: NEGATIVE            The Xpert SA Assay (FDA     approved for NASAL specimens     only), is one component of     a comprehensive surveillance     program.  It is not intended     to diagnose infection nor to     guide or monitor treatment.    Coagulation Studies:  Basename 01/28/12 1830  LABPROT 13.1  INR 1.00    Urinalysis: No results found for this basename: COLORURINE:2,APPERANCEUR:2,LABSPEC:2,PHURINE:2,GLUCOSEU:2,HGBUR:2,BILIRUBINUR:2,KETONESUR:2,PROTEINUR:2,UROBILINOGEN:2,NITRITE:2,LEUKOCYTESUR:2 in the last 168 hours  Lipid Panel: No results found for this basename: chol, trig, hdl, cholhdl, vldl, ldlcalc    HgbA1C:  No results found for this basename: HGBA1C    Urine Drug Screen:   No results found for this basename: labopia, cocainscrnur, labbenz, amphetmu, thcu, labbarb     Alcohol Level: No results found for this basename: ETH:2 in the last 168 hours   Imaging: Ct Head (brain) Wo Contrast  01/28/2012  *RADIOLOGY REPORT*  Clinical Data: 78 year old male with right side weakness and facial droop.  Code stroke.  CT HEAD WITHOUT CONTRAST  Technique:  Contiguous axial images were obtained from the base of the skull through the vertex without contrast.  Comparison: Brain MRI 08/12/2006.  Findings: Visualized paranasal sinuses and mastoids are clear.  No acute orbit or scalp soft tissue findings. No acute osseous abnormality identified.  Chronic sulcal variation at the left superior frontal gyrus is stable.  Stable cerebral volume.  No ventriculomegaly. No evidence of cortically based acute infarction identified.  No acute intracranial hemorrhage identified.  No suspicious intracranial vascular hyperdensity.  Largely normal for age gray-white matter differentiation throughout the brain.  IMPRESSION: No acute cortically based infarct or acute intracranial abnormality identified.  Critical Value/emergent results were called by telephone at the time of interpretation on 01/28/2012 at 1847 hours to Dr. Aubery Lapping, who verbally acknowledged these results.   Original Report Authenticated By: Ulla Potash III,  M.D.     Assessment: 78 y.o. male with initial complaints of RUE weakness and slurred speech now with only slurred speech and right facial droop on exam.  Patient with multiple stroke risk factors.  No evidence on exam of large vessel ischemic event.  At this point risks of tPA outweigh possible benefits.  Discussion had with son.  Will continue to monitor while within the treatment window for change in status.    Stroke Risk Factors - diabetes mellitus, hyperlipidemia and hypertension  Plan: 1. HgbA1c, fasting lipid panel 2. MRI, MRA  of the brain without contrast 3. PT consult, OT consult, Speech consult 4. Echocardiogram 5. Carotid dopplers 6. Prophylactic  therapy-Antiplatelet med: Plavix - dose 75mg  daily 7. Risk factor modification 8. Telemetry monitoring 9. Frequent neuro checks  Case discussed with Dr. Lovey Newcomer, MD [facility] [phone] 01/28/2012, 7:51 PM

## 2012-01-28 NOTE — ED Notes (Signed)
Code Stroke encode at 1806, Code stroke called 1808.Pt was last seen normal at 1730. Pt arrived to ED at 1822.  EDP arrived and assessed pt at 46. Phlebotomist arrival at [employer]. Pt went to CT at 1827 and stroke team arrive at 1828. EDP read CT at 1840.

## 2012-01-28 NOTE — ED Notes (Signed)
CBG was 201. Notified Nurse.

## 2012-01-28 NOTE — ED Notes (Signed)
Chem 8 results-  Na-140 k-4.1 Cl-106 ica-1.21 tc02-21 Glu-223 Bun-28 crea-1.7 hct-40 Hb-13.6 angap-17

## 2012-01-28 NOTE — ED Notes (Addendum)
[phone] (H), [phone] (C) pt's spouse, Chaska Hagger

## 2012-01-28 NOTE — H&P (Signed)
Tyler [name] is an 78 y.o. male.   Patient was seen and examined on January 28, 2012. PCP - Dr. Juleen China. Chief Complaint: Right upper extremity weakness with slurred speech and right facial droop. HPI: 78 year-old male with history of hypertension, diabetes mellitus type 2, chronic kidney disease and hyperlipidemia started experiencing right upper extremity weakness and right facial droop and slurred speech at around 5:30 p.m. Patient was brought to the ER and CT head did not show any acute. Neurologist on-call consulted and per neurology patient was not a candidate for TPA. Patient otherwise denies any headache blurred vision difficulty swallowing dizziness or weakness left side. Denies any fall or dizziness. Denies chest pain or shortness of breath. Patient will be admitted for further management.  Past Medical History  Diagnosis Date  . Hypertension   . Cancer     Past Surgical History  Procedure Date  . Prostate surgery   . Cardiac catheterization     History reviewed. No pertinent family history. Social History:  reports that he quit smoking about 43 years ago. He does not have any smokeless tobacco history on file. He reports that he does not drink alcohol or use illicit drugs.  Allergies:  Allergies  Allergen Reactions  . Sulfa Drugs Cross Reactors Rash    Medications Prior to Admission  Medication Sig Dispense Refill  . aspirin EC 81 MG tablet Take 81 mg by mouth daily.      . Cholecalciferol (VITAMIN D-3 PO) Take 1 tablet by mouth daily.      . Cyanocobalamin (VITAMIN B-12 PO) Take 1 tablet by mouth daily.      . fish oil-omega-3 fatty acids 1000 MG capsule Take 1 g by mouth daily.      . Flaxseed, Linseed, (FLAX SEEDS PO) Take 1 tablet by mouth daily.      [name] glimepiride (AMARYL) 4 MG tablet Take 4 mg by mouth daily before breakfast.      . losartan (COZAAR) 50 MG tablet Take 50 mg by mouth daily.      . pantoprazole (PROTONIX) 40 MG tablet Take 40 mg by mouth daily.       . pravastatin (PRAVACHOL) 10 MG tablet Take 10 mg by mouth daily.      . STUDY MEDICATION Inject 9 Units into the skin daily.        Results for orders placed during the hospital encounter of 01/28/12 (from the past 48 hour(s))  PROTIME-INR     Status: Normal   Collection Time   01/28/12  6:30 PM      Component Value Range Comment   Prothrombin Time 13.1  11.6 - 15.2 seconds    INR 1.00  0.00 - 1.49   APTT     Status: Normal   Collection Time   01/28/12  6:30 PM      Component Value Range Comment   aPTT 30  24 - 37 seconds   CBC     Status: Normal   Collection Time   01/28/12  6:30 PM      Component Value Range Comment   WBC 8.2  4.0 - 10.5 K/uL    RBC 4.27  4.22 - 5.81 MIL/uL    Hemoglobin 13.4  13.0 - 17.0 g/dL    HCT 19.1  47.8 - 29.5 %    MCV 91.3  78.0 - 100.0 fL    MCH 31.4  26.0 - 34.0 pg    MCHC 34.4  30.0 -  36.0 g/dL    RDW 16.1  09.6 - 04.5 %    Platelets 154  150 - 400 K/uL   DIFFERENTIAL     Status: Normal   Collection Time   01/28/12  6:30 PM      Component Value Range Comment   Neutrophils Relative 50  43 - 77 %    Neutro Abs 4.1  1.7 - 7.7 K/uL    Lymphocytes Relative 39  12 - 46 %    Lymphs Abs 3.2  0.7 - 4.0 K/uL    Monocytes Relative 6  3 - 12 %    Monocytes Absolute 0.5  0.1 - 1.0 K/uL    Eosinophils Relative 5  0 - 5 %    Eosinophils Absolute 0.4  0.0 - 0.7 K/uL    Basophils Relative 1  0 - 1 %    Basophils Absolute 0.0  0.0 - 0.1 K/uL   COMPREHENSIVE METABOLIC PANEL     Status: Abnormal   Collection Time   01/28/12  6:30 PM      Component Value Range Comment   Sodium 137  135 - 145 mEq/L    Potassium 4.1  3.5 - 5.1 mEq/L    Chloride 103  96 - 112 mEq/L    CO2 22  19 - 32 mEq/L    Glucose, Bld 232 (*) 70 - 99 mg/dL    BUN 26 (*) 6 - 23 mg/dL    Creatinine, Ser 4.09 (*) 0.50 - 1.35 mg/dL    Calcium 9.5  8.4 - 81.1 mg/dL    Total Protein 6.9  6.0 - 8.3 g/dL    Albumin 3.8  3.5 - 5.2 g/dL    AST 16  0 - 37 U/L    ALT 16  0 - 53 U/L    Alkaline  Phosphatase 117  39 - 117 U/L    Total Bilirubin 0.3  0.3 - 1.2 mg/dL    GFR calc non Af Amer 38 (*) >90 mL/min    GFR calc Af Amer 45 (*) >90 mL/min   TROPONIN I     Status: Normal   Collection Time   01/28/12  6:30 PM      Component Value Range Comment   Troponin I <0.30  <0.30 ng/mL   GLUCOSE, CAPILLARY     Status: Abnormal   Collection Time   01/28/12  6:50 PM      Component Value Range Comment   Glucose-Capillary 201 (*) 70 - 99 mg/dL    Comment 1 Documented in Chart      Comment 2 Notify RN      Ct Head (brain) Wo Contrast  01/28/2012  *RADIOLOGY REPORT*  Clinical Data: 78 year old male with right side weakness and facial droop.  Code stroke.  CT HEAD WITHOUT CONTRAST  Technique:  Contiguous axial images were obtained from the base of the skull through the vertex without contrast.  Comparison: Brain MRI 08/12/2006.  Findings: Visualized paranasal sinuses and mastoids are clear.  No acute orbit or scalp soft tissue findings. No acute osseous abnormality identified.  Chronic sulcal variation at the left superior frontal gyrus is stable.  Stable cerebral volume.  No ventriculomegaly. No evidence of cortically based acute infarction identified.  No acute intracranial hemorrhage identified.  No suspicious intracranial vascular hyperdensity.  Largely normal for age gray-white matter differentiation throughout the brain.  IMPRESSION: No acute cortically based infarct or acute intracranial abnormality identified.  Critical Value/emergent results were called by  telephone at the time of interpretation on 01/28/2012 at 1847 hours to Dr. Aubery Lapping, who verbally acknowledged these results.   Original Report Authenticated By: Harley Hallmark, M.D.     Review of Systems  Constitutional: Negative.   HENT: Negative.   Eyes: Negative.   Respiratory: Negative.   Cardiovascular: Negative.   Gastrointestinal: Negative.   Genitourinary: Negative.   Musculoskeletal: Negative.   Skin: Negative.   Neurological:  Positive for speech change (slurred speech.) and focal weakness (right upper extremity weakness.).       Right facial droop.  Endo/Heme/Allergies: Negative.   Psychiatric/Behavioral: Negative.     Blood pressure 158/57, pulse 70, temperature 98 F (36.7 C), temperature source Oral, resp. rate 16, height 5\' 10"  (1.778 m), weight 84.823 kg (187 lb), SpO2 98.00%. Physical Exam  Constitutional: He is oriented to person, place, and time. He appears well-developed and well-nourished. No distress.  HENT:  Head: Normocephalic and atraumatic.  Right Ear: External ear normal.  Left Ear: External ear normal.  Nose: Nose normal.  Mouth/Throat: Oropharynx is clear and moist. No oropharyngeal exudate.  Eyes: Conjunctivae normal are normal. Pupils are equal, round, and reactive to light. Right eye exhibits no discharge. Left eye exhibits no discharge. No scleral icterus.  Neck: Normal range of motion. Neck supple.  Cardiovascular: Normal rate and regular rhythm.   Respiratory: Effort normal and breath sounds normal. No respiratory distress. He has no wheezes. He has no rales.  GI: Soft. Bowel sounds are normal. He exhibits no distension. There is no tenderness. There is no rebound and no guarding.  Musculoskeletal: He exhibits no edema and no tenderness.  Neurological: He is alert and oriented to person, place, and time.       Mild right upper extremity weakness and right facial droop with tongue deviated to the right. Left upper and left lower extremity 5/5 strength.  Skin: Skin is warm and dry. He is not diaphoretic.  Psychiatric: His behavior is normal.     Assessment/Plan #1. CVA - patient has been placed on neurochecks, swallow evaluation MRI/MRA brain carotid Doppler and 2-D echo. Monitor shows sinus rhythm. Aspirin and further recommendations per neurologist. #2. Diabetes mellitus type 2 - continue Amaryl and will place on sliding-scale coverage. Patient is on a research medication for diabetes  which will be on hold.  #3. Hypertension  - continue home medications. Patient on Cozaar. Closely follow creatinine if it worsens then may have to hold it.  #4. Chronic kidney disease - follow creatinine closely. See #3. #5. Hyperlipidemia - continue home medications. Check fasting lipid panel. #6. History of prostate cancer status post surgery.  CODE STATUS - full code.  Genia Perin N. 01/28/2012, 11:20 PM

## 2012-01-28 NOTE — ED Provider Notes (Signed)
History     CSN: [id number]  Arrival date & time 01/28/12  4098   First MD Initiated Contact with Patient 01/28/12 1854      Chief Complaint  Patient presents with  . Code Stroke    (Consider location/radiation/quality/duration/timing/severity/associated sxs/prior treatment) Patient is a 78 y.o. male presenting with neurologic complaint. The history is provided by the patient and the EMS personnel.  Neurologic Problem The primary symptoms include focal weakness and speech change. Primary symptoms do not include headaches, syncope, loss of consciousness, altered mental status, seizures, dizziness, visual change, paresthesias, loss of sensation, memory loss, fever, nausea or vomiting. The symptoms began 1 to 2 hours ago. The symptoms are improving. The neurological symptoms are focal.  Weakness began 1 - 3 hours ago. The weakness is improving. There is near normal muscle function with maximum physical effort.  There is weakness in these regions/motions: right bicep flexion, right forearm extension, right thigh flexion and right hip flexion.  Change in speech began 1 - 3 hours ago. The speech change is unchanged. Description of speech change: slurred speech.    Past Medical History  Diagnosis Date  . Hypertension   . Cancer     Past Surgical History  Procedure Date  . Prostate surgery     History reviewed. No pertinent family history.  History  Substance Use Topics  . Smoking status: Former Smoker    Quit date: 05/04/1968  . Smokeless tobacco: Not on file  . Alcohol Use: No      Review of Systems  Constitutional: Negative for fever.  Respiratory: Negative for cough and shortness of breath.   Cardiovascular: Negative for chest pain and syncope.  Gastrointestinal: Negative for nausea, vomiting, abdominal pain and diarrhea.  Neurological: Positive for speech change, focal weakness and facial asymmetry (right facial droop). Negative for dizziness, seizures, loss of  consciousness, headaches and paresthesias.  Psychiatric/Behavioral: Negative for memory loss and altered mental status.  All other systems reviewed and are negative.    Allergies  Sulfa drugs cross reactors  Home Medications  No current outpatient prescriptions on file.  BP 180/75  Pulse 77  Temp 98.1 F (36.7 C) (Oral)  Resp 16  SpO2 100%  Physical Exam  Nursing note and vitals reviewed. Constitutional: He is oriented to person, place, and time. He appears well-developed and well-nourished. No distress.  HENT:  Head: Normocephalic and atraumatic.  Eyes: EOM are normal. Pupils are equal, round, and reactive to light.  Cardiovascular: Normal rate and normal heart sounds.   Pulmonary/Chest: Effort normal and breath sounds normal. No respiratory distress.  Abdominal: Soft. He exhibits no distension. There is no tenderness.  Musculoskeletal: Normal range of motion.  Neurological: He is alert and oriented to person, place, and time. A cranial nerve deficit (right facial droop) is present. He exhibits abnormal muscle tone (slight right arm and leg weakness as compared to other side).  Skin: Skin is warm and dry.  Psychiatric: He has a normal mood and affect.    ED Course  Procedures (including critical care time)  Labs Reviewed  COMPREHENSIVE METABOLIC PANEL - Abnormal; Notable for the following:    Glucose, Bld 232 (*)     BUN 26 (*)     Creatinine, Ser 1.61 (*)     GFR calc non Af Amer 38 (*)     GFR calc Af Amer 45 (*)     All other components within normal limits  GLUCOSE, CAPILLARY - Abnormal; Notable for the following:  Glucose-Capillary 201 (*)     All other components within normal limits  PROTIME-INR  APTT  CBC  DIFFERENTIAL  TROPONIN I  COMPREHENSIVE METABOLIC PANEL  CBC  TSH  HEMOGLOBIN A1C  LIPID PANEL   Ct Head (brain) Wo Contrast  01/28/2012  *RADIOLOGY REPORT*  Clinical Data: 78 year old male with right side weakness and facial droop.  Code  stroke.  CT HEAD WITHOUT CONTRAST  Technique:  Contiguous axial images were obtained from the base of the skull through the vertex without contrast.  Comparison: Brain MRI 08/12/2006.  Findings: Visualized paranasal sinuses and mastoids are clear.  No acute orbit or scalp soft tissue findings. No acute osseous abnormality identified.  Chronic sulcal variation at the left superior frontal gyrus is stable.  Stable cerebral volume.  No ventriculomegaly. No evidence of cortically based acute infarction identified.  No acute intracranial hemorrhage identified.  No suspicious intracranial vascular hyperdensity.  Largely normal for age gray-white matter differentiation throughout the brain.  IMPRESSION: No acute cortically based infarct or acute intracranial abnormality identified.  Critical Value/emergent results were called by telephone at the time of interpretation on 01/28/2012 at 1847 hours to Dr. Aubery Lapping, who verbally acknowledged these results.   Original Report Authenticated By: Harley Hallmark, M.D.      1. Speech abnormality   2. Cerebral embolism with cerebral infarction   3. CVA (cerebral infarction)   4. DM2 (diabetes mellitus, type 2)   5. History of prostate cancer   6. Hyperlipidemia       MDM   Pt activated as Code Stroke upon arrival due to right facial droop and slight right side weakness as well as mild slurred speech. Pt taken directly to CT.  Neurology at bedside when CT head results returned. No acute bleed.  Neurology feels that as patient has very minor deficits (some having improved), he should not get tPA as the risk would outweigh the minor benefit he may get from it. Will admit to medicine for CVA workup.     Daleen Bo, MD 01/29/12 [id number]

## 2012-01-29 ENCOUNTER — Inpatient Hospital Stay (HOSPITAL_COMMUNITY): Payer: Medicare Other

## 2012-01-29 DIAGNOSIS — I6789 Other cerebrovascular disease: Secondary | ICD-10-CM

## 2012-01-29 DIAGNOSIS — I6350 Cerebral infarction due to unspecified occlusion or stenosis of unspecified cerebral artery: Secondary | ICD-10-CM

## 2012-01-29 DIAGNOSIS — I6340 Cerebral infarction due to embolism of unspecified cerebral artery: Principal | ICD-10-CM

## 2012-01-29 DIAGNOSIS — R4789 Other speech disturbances: Secondary | ICD-10-CM

## 2012-01-29 LAB — COMPREHENSIVE METABOLIC PANEL
Albumin: 3.8 g/dL (ref 3.5–5.2)
BUN: 22 mg/dL (ref 6–23)
Chloride: 104 mEq/L (ref 96–112)
Creatinine, Ser: 1.49 mg/dL — ABNORMAL HIGH (ref 0.50–1.35)
GFR calc Af Amer: 49 mL/min — ABNORMAL LOW (ref >90)
GFR calc non Af Amer: 42 mL/min — ABNORMAL LOW (ref >90)
Glucose, Bld: 107 mg/dL — ABNORMAL HIGH (ref 70–99)
Total Bilirubin: 0.7 mg/dL (ref 0.3–1.2)

## 2012-01-29 LAB — BASIC METABOLIC PANEL
BUN: 22 mg/dL (ref 6–23)
Chloride: 102 mEq/L (ref 96–112)
Glucose, Bld: 138 mg/dL — ABNORMAL HIGH (ref 70–99)
Potassium: 4.1 mEq/L (ref 3.5–5.1)

## 2012-01-29 LAB — POCT I-STAT, CHEM 8
BUN: 28 mg/dL — ABNORMAL HIGH (ref 6–23)
Calcium, Ion: 1.21 mmol/L (ref 1.13–1.30)
Chloride: 106 mEq/L (ref 96–112)
Potassium: 4.1 mEq/L (ref 3.5–5.1)

## 2012-01-29 LAB — CBC
MCV: 89.8 fL (ref 78.0–100.0)
Platelets: 173 10*3/uL (ref 150–400)
RBC: 4.4 MIL/uL (ref 4.22–5.81)
WBC: 8.4 10*3/uL (ref 4.0–10.5)

## 2012-01-29 LAB — GLUCOSE, CAPILLARY
Glucose-Capillary: 110 mg/dL — ABNORMAL HIGH (ref 70–99)
Glucose-Capillary: 135 mg/dL — ABNORMAL HIGH (ref 70–99)

## 2012-01-29 LAB — LIPID PANEL
LDL Cholesterol: 82 mg/dL (ref 0–99)
Triglycerides: 125 mg/dL (ref <150)

## 2012-01-29 LAB — HEMOGLOBIN A1C: Hgb A1c MFr Bld: 6.7 % — ABNORMAL HIGH (ref <5.7)

## 2012-01-29 LAB — TSH: TSH: 3.053 u[IU]/mL (ref 0.350–4.500)

## 2012-01-29 LAB — PHOSPHORUS: Phosphorus: 3 mg/dL (ref 2.3–4.6)

## 2012-01-29 MED ORDER — METOPROLOL TARTRATE 25 MG PO TABS: 25.0000 mg | ORAL_TABLET | Freq: Two times a day (BID) | ORAL | Status: DC

## 2012-01-29 MED ORDER — METOPROLOL TARTRATE 12.5 MG HALF TABLET
12.5000 mg | ORAL_TABLET | Freq: Two times a day (BID) | ORAL | Status: DC
  Administered 2012-01-29 – 2012-02-02 (×8): 12.5 mg via ORAL
  Filled 2012-01-29 (×10): qty 1

## 2012-01-29 MED ORDER — CLOPIDOGREL BISULFATE 75 MG PO TABS
75.0000 mg | ORAL_TABLET | Freq: Every day | ORAL | Status: DC
  Administered 2012-01-30 – 2012-02-02 (×4): 75 mg via ORAL
  Filled 2012-01-29 (×6): qty 1

## 2012-01-29 MED ORDER — MAGNESIUM SULFATE 40 MG/ML IJ SOLN
2.0000 g | Freq: Once | INTRAMUSCULAR | Status: AC
  Administered 2012-01-29: 2 g via INTRAVENOUS
  Filled 2012-01-29: qty 50

## 2012-01-29 NOTE — Progress Notes (Addendum)
Echocardiogram 2D Echocardiogram has been performed.  01/29/2012 12:10 PM Gertie Fey, RDMS, RDCS

## 2012-01-29 NOTE — Evaluation (Signed)
Speech Language Pathology Evaluation Patient Details Name: Tyler [name] MRN: [id number] DOB: August 08, 1928 Today's Date: 01/29/2012 Time: 6578-4696 SLP Time Calculation (min): 27 min  Problem List:  Patient Active Problem List  Diagnosis  . Speech abnormality  . Cerebral embolism with cerebral infarction  . CVA (cerebral infarction)  . DM2 (diabetes mellitus, type 2)  . Hyperlipidemia  . History of prostate cancer   Past Medical History:  Past Medical History  Diagnosis Date  . Hypertension   . Cancer    Past Surgical History:  Past Surgical History  Procedure Date  . Prostate surgery   . Cardiac catheterization    HPI:  78 year-old male with history of hypertension, diabetes mellitus type 2, chronic kidney disease and hyperlipidemia started experiencing right upper extremity weakness and right facial droop and slurred speech at around 5:30 p.m. Patient was brought to the ER and CT head did not show any acute cortically based infarct.  Patient referred for Cognitive Linguistic Evaluation per stroke protocol.    Assessment / Plan / Recommendation Clinical Impression  Cognitive Linguistic evaluation completed. Cognitive skills judged to be [facility]. Minimal dysarthria present affecting intelligibility at conversation level..  Techniques of slow rate of speech, over articulation and increased vocal intensity effective in increasing  speech intelligiblity.  No Skilled ST treatment recommended in acute care setting.  All further ST needs can be addressed at next level of care if warranted.   Patient may benefit from CIR consult.      SLP Assessment  All further Speech Lanaguage Pathology  needs can be addressed in the next venue of care    Follow Up Recommendations  Inpatient Rehab              SLP Evaluation Prior Functioning  Cognitive/Linguistic Baseline: Within functional limits Type of Home: House Lives With: Spouse Available Help at Discharge: Family;Available 24  hours/day Education: 12 th grade education Vocation: Retired   IT consultant  Overall Cognitive Status: Appears within functional limits for tasks assessed Arousal/Alertness: Awake/alert Orientation Level: Oriented X4 Attention: Focused Focused Attention: Appears intact Memory: Appears intact Awareness: Appears intact Problem Solving: Appears intact Safety/Judgment: Appears intact    Comprehension  Auditory Comprehension Overall Auditory Comprehension: Appears within functional limits for tasks assessed    Expression Expression Primary Mode of Expression: Verbal Verbal Expression Overall Verbal Expression: Appears within functional limits for tasks assessed Written Expression Dominant Hand: Right   Oral / Motor Oral Motor/Sensory Function Overall Oral Motor/Sensory Function: Impaired Labial ROM: Reduced right Labial Symmetry: Abnormal symmetry right Labial Strength: Reduced Labial Sensation: Reduced Lingual ROM: Reduced right Lingual Symmetry: Abnormal symmetry right Lingual Strength: Reduced Lingual Sensation: Reduced Facial ROM: Reduced right Facial Symmetry: Right droop Facial Strength: Reduced Facial Sensation: Reduced Velum: Within Functional Limits Mandible: Within Functional Limits Motor Speech Overall Motor Speech: Impaired Respiration: Within functional limits Phonation: Normal Resonance: Hypernasality Intelligibility: Intelligibility reduced Word: 75-100% accurate Phrase: 75-100% accurate Sentence: 75-100% accurate Conversation: 50-74% accurate Motor Planning: Witnin functional limits Effective Techniques: Slow rate;Increased vocal intensity;Over-articulate   GO Moreen Fowler MS, CCC-SLP [phone]   [facility] 01/29/2012, 9:56 AM

## 2012-01-29 NOTE — Progress Notes (Signed)
Occupational Therapy Evaluation Patient Details Name: Tyler [name] MRN: [id number] DOB: May 28, 1928 Today's Date: 01/29/2012 Time: 5621-3086 OT Time Calculation (min): 36 min  OT Assessment / Plan / Recommendation Clinical Impression  Pt admitted with RUE weakness and slurred speech. MRI finding show acute non hemorrhagic infarct posterior left lenticular nucleus extending into the posterior aspect of the left corona radiata. Will benefit from acute OT services to address below problem list. Recommend CIR at d/c to further maximize independence with ADLs before eventual return home.    OT Assessment  Patient needs continued OT Services    Follow Up Recommendations  Inpatient Rehab    Barriers to Discharge Decreased caregiver support Wife works during day. May be able to get 24/7 assist.  Equipment Recommendations   (TBD pending pt progress)    Recommendations for Other Services Rehab consult  Frequency  Min 3X/week    Precautions / Restrictions Precautions Precautions: Fall Precaution Comments: ' Restrictions Weight Bearing Restrictions: No   Pertinent Vitals/Pain See vitals    ADL  Upper Body Dressing: Performed;Minimal assistance Where Assessed - Upper Body Dressing: Supported sitting Lower Body Dressing: Performed;Moderate assistance Where Assessed - Lower Body Dressing: Supported sitting Toilet Transfer: Simulated;Moderate assistance Toilet Transfer Method: Squat pivot Toilet Transfer Equipment:  (bed to chair) Equipment Used: Gait belt Transfers/Ambulation Related to ADLs: mod assist with squat pivot from bed to chair.  Pt performed sit<>stand from chair with mod assist. ADL Comments: decreased balance sitting EOB for ADLs.    OT Diagnosis: Paresis  OT Problem List: Impaired balance (sitting and/or standing);Decreased activity tolerance;Decreased strength;Decreased knowledge of use of DME or AE;Impaired UE functional use;Impaired sensation;Decreased coordination OT  Treatment Interventions: Self-care/ADL training;Therapeutic activities;Neuromuscular education;DME and/or AE instruction;Patient/family education;Balance training   OT Goals Acute Rehab OT Goals OT Goal Formulation: With patient Time For Goal Achievement: 02/12/12 Potential to Achieve Goals: Good ADL Goals Pt Will Perform Grooming: with set-up;Sitting, edge of bed;Sitting, chair (3 tasks; using RUE) ADL Goal: Grooming - Progress: Goal set today Pt Will Perform Upper Body Bathing: with set-up;Sitting, chair;Sitting, edge of bed (using RUE) ADL Goal: Upper Body Bathing - Progress: Goal set today Pt Will Perform Lower Body Bathing: with set-up;Sitting, chair;Sitting, edge of bed (using RUE) ADL Goal: Lower Body Bathing - Progress: Goal set today Pt Will Transfer to Toilet: with min assist;Ambulation;with DME;Comfort height toilet ADL Goal: Toilet Transfer - Progress: Goal set today Miscellaneous OT Goals Miscellaneous OT Goal #1: Pt will perform dynamic sitting balance task with supervision >10 min in prep for ADLs. OT Goal: Miscellaneous Goal #1 - Progress: Goal set today  Visit Information  Last OT Received On: 01/29/12 Assistance Needed: +2 PT/OT Co-Evaluation/Treatment: Yes    Subjective Data      Prior Functioning     Home Living Lives With: Spouse Available Help at Discharge: Family;Available PRN/intermittently (wife works during the day) Type of Home: House Home Access: Level entry Home Layout: One level (2 steps inside house, no rails) Bathroom Shower/Tub: Forensic scientist: Standard Bathroom Accessibility: Yes How Accessible: Accessible via walker Home Adaptive Equipment: Straight cane Prior Function Level of Independence: Independent Able to Take Stairs?: Yes Driving: Yes Vocation: Retired Comments: Agricultural consultant at [employer] Communication: Expressive difficulties Dominant Hand: Right         Vision/Perception  Praxis Praxis: Impaired Praxis Impairment Details: Psychologist, forensic Comments: RUE and RLE motor planning deficits   Cognition  Overall Cognitive Status: Appears within functional limits for tasks assessed/performed Arousal/Alertness: Awake/alert Orientation Level:  Oriented X4 / Intact Behavior During Session: [facility] for tasks performed    Extremity/Trunk Assessment Right Upper Extremity Assessment RUE ROM/Strength/Tone: Deficits RUE ROM/Strength/Tone Deficits: 3/5 digit flexion/extension.  3+/5 throughout wrist, elbow, and shoulder. RUE Sensation: Deficits RUE Sensation Deficits: tingling RUE Coordination: Deficits RUE Coordination Deficits: ataxic Left Upper Extremity Assessment LUE ROM/Strength/Tone: Within functional levels LUE Sensation: WFL - Light Touch;WFL - Proprioception LUE Coordination: WFL - gross/fine motor     Mobility Bed Mobility Bed Mobility: Supine to Sit;Sitting - Scoot to Edge of Bed Supine to Sit: 3: Mod assist Sitting - Scoot to Edge of Bed: 3: Mod assist Details for Bed Mobility Assistance: Assist to support trunk OOB and to scoot right hip to EOB. Transfers Transfers: Sit to Stand;Stand to Sit Sit to Stand: 3: Mod assist;From chair/3-in-1;With upper extremity assist Stand to Sit: 4: Min assist;To chair/3-in-1;With upper extremity assist     Shoulder Instructions     Exercise     Balance Balance Balance Assessed: Yes Static Sitting Balance Static Sitting - Balance Support: Feet supported Static Sitting - Level of Assistance: 4: Min assist Static Sitting - Comment/# of Minutes: Pt sat EOB with min assist due to posterior lean.  Pt sometimes able to correct with VC but often required tactile cueing to obtain midline orientation. Static Standing Balance Static Standing - Balance Support: Bilateral upper extremity supported Static Standing - Level of Assistance: 3: Mod assist Static Standing - Comment/# of Minutes: Assist to trunk to maintain  balance in midline with manual facilitation to RLE for stabilization.    End of Session OT - End of Session Equipment Utilized During Treatment: Gait belt Activity Tolerance: Patient tolerated treatment well Patient left: in chair;with call bell/phone within reach;with family/visitor present Nurse Communication: Mobility status  GO    01/29/2012 Cipriano Mile OTR/L Pager [phone] Office [phone]  Jamyson, Jirak 01/29/2012, 5:28 PM

## 2012-01-29 NOTE — Progress Notes (Signed)
[facility]                                                                                Patient Demographics  Tyler [name], is a 78 y.o. male  WUJ:[id number]  NFA:[id number]  DOB - [birth year]  Admit date - 01/28/2012  Admitting Physician Cristal Ford, MD  Outpatient Primary MD for the patient is Michiel Sites, MD  LOS - 1   Chief Complaint  Patient presents with  . Code Stroke        Assessment & Plan    #1. L.MCA Ischemic CVA - continue neurochecks, swallow evaluation MRI/MRA brain carotid Doppler and 2-D echo. Monitor shows sinus rhythm. Aspirin continue, Neuro following. PT-OT and Speech input.    #2. Diabetes mellitus type 2 - continue Amaryl and will place on sliding-scale coverage. Patient is on a research medication for diabetes which will be on hold.   No results found for this basename: HGBA1C    CBG (last 3)   Basename 01/28/12 1850  GLUCAP 201*       #3. Hypertension - permissive HTN due to CVA, resume Home meds in 48 hrs gradually.    #4. Chronic kidney disease stage 4, Craet at baseline around 1.6, ARB resume in 48hrs.    #5. Hyperlipidemia - continue home medications. Check fasting lipid panel.   Lab Results  Component Value Date   CHOL 138 01/29/2012   HDL 31* 01/29/2012   LDLCALC 82 01/29/2012   TRIG 125 01/29/2012   CHOLHDL 4.5 01/29/2012     #6. History of prostate cancer status post surgery.   Code Status: Full  Family Communication: D/W patient  Disposition Plan: TBD    Procedures   MRI-A,Echo,Carotid US   Consults  Neuro,PT,OT,Speech   Time Spent in minutes   35   Antibiotics    Anti-infectives    None      Scheduled Meds:   . clopidogrel  75 mg Oral Q breakfast  . enoxaparin  40 mg Subcutaneous Q24H  . glimepiride  4 mg Oral QAC breakfast  . influenza  inactive virus vaccine  0.5 mL Intramuscular Tomorrow-1000  . insulin aspart  0-9 Units Subcutaneous TID WC  . omega-3 acid  ethyl esters  1 g Oral Daily  . pantoprazole  40 mg Oral Q1200  . simvastatin  5 mg Oral q1800  . DISCONTD: aspirin  300 mg Rectal Daily  . DISCONTD: aspirin  325 mg Oral Daily  . DISCONTD: losartan  50 mg Oral Daily   Continuous Infusions:  PRN Meds:.senna-docusate   DVT Prophylaxis  Lovenox     Leroy Sea M.D on 01/29/2012 at 10:58 AM  Between 7am to 7pm - Pager - [phone]  After 7pm go to [email or web address] - password [email or web address]  And look for the night coverage person covering for me after hours  [facility] Office  [phone]    Subjective:   Tyler [name] today has, No headache, No chest pain, No abdominal pain - No Nausea, No new weakness tingling or numbness, No Cough - SOB.    Objective:   Filed Vitals:   01/29/12 0100 01/29/12 0300 01/29/12 0600 01/29/12 2536  BP: 182/62 177/74 177/55 163/63  Pulse: 58 70 70 71  Temp:  97.7 F (36.5 C) 98 F (36.7 C) 97.6 F (36.4 C)  TempSrc:  Oral Oral Oral  Resp: 20 22 18 18   Height:      Weight:      SpO2: 99% 100% 98% 97%    Wt Readings from Last 3 Encounters:  01/28/12 84.823 kg (187 lb)    No intake or output data in the 24 hours ending 01/29/12 1058  Exam Awake Alert, Oriented X 3, No new F.N deficits, Rt facial droop, Rt Arm strength 4/5, Normal affect [name],[name],[name], [name].  Symmetrical Chest wall movement, Good air movement bilaterally, CTAB RRR,No Gallops,Rubs or new Murmurs, No Parasternal Heave +ve B.Sounds, Abd Soft, Non tender, No organomegaly appriciated, No rebound - guarding or rigidity. No Cyanosis, Clubbing or edema, No new Rash or bruise      Data Review   Micro Results No results found for this or any previous visit (from the past 240 hour(s)).  Radiology Reports Dg Chest 2 View  01/29/2012  *RADIOLOGY REPORT*  Clinical Data: Generalized weakness.  CHEST - 2 VIEW  Comparison: Two-view chest x-ray 07/08/2010, 03/24/2010,  11/28/2009.  Findings: Cardiac silhouette upper normal in size for the AP portable technique, unchanged.  Cardiac silhouette normal in size for the AP technique, unchanged.  Thoracic aorta mildly atherosclerotic, unchanged.  Hilar and mediastinal contours otherwise unremarkable.  Suboptimal inspiration accounts for mild atelectasis in the lower lobes.  Lungs otherwise clear.  No pleural effusions.  Degenerative changes involving the thoracic spine.  IMPRESSION: Suboptimal inspiration accounts for mild atelectasis in the lower lobes.  No acute cardiopulmonary disease otherwise.   Original Report Authenticated By: Arnell Sieving, M.D.    Ct Head (brain) Wo Contrast  01/28/2012  *RADIOLOGY REPORT*  Clinical Data: 78 year old male with right side weakness and facial droop.  Code stroke.  CT HEAD WITHOUT CONTRAST  Technique:  Contiguous axial images were obtained from the base of the skull through the vertex without contrast.  Comparison: Brain MRI 08/12/2006.  Findings: Visualized paranasal sinuses and mastoids are clear.  No acute orbit or scalp soft tissue findings. No acute osseous abnormality identified.  Chronic sulcal variation at the left superior frontal gyrus is stable.  Stable cerebral volume.  No ventriculomegaly. No evidence of cortically based acute infarction identified.  No acute intracranial hemorrhage identified.  No suspicious intracranial vascular hyperdensity.  Largely normal for age gray-white matter differentiation throughout the brain.  IMPRESSION: No acute cortically based infarct or acute intracranial abnormality identified.  Critical Value/emergent results were called by telephone at the time of interpretation on 01/28/2012 at 1847 hours to Dr. Aubery Lapping, who verbally acknowledged these results.   Original Report Authenticated By: Harley Hallmark, M.D.     CBC  Lab 01/29/12 0705 01/28/12 1841 01/28/12 1830  WBC 8.4 -- 8.2  HGB 13.7 13.6 13.4  HCT 39.5 40.0 39.0  PLT 173 -- 154  MCV  89.8 -- 91.3  MCH 31.1 -- 31.4  MCHC 34.7 -- 34.4  RDW 12.8 -- 13.2  LYMPHSABS -- -- 3.2  MONOABS -- -- 0.5  EOSABS -- -- 0.4  BASOSABS -- -- 0.0  BANDABS -- -- --    Chemistries   Lab 01/29/12 0705 01/28/12 1841 01/28/12 1830  NA 138 140 137  K 4.0 4.1 4.1  CL 104 106 103  CO2 24 -- 22  GLUCOSE 107* 223* 232*  BUN 22 28* 26*  CREATININE 1.49* 1.70* 1.61*  CALCIUM 9.8 -- 9.5  MG -- -- --  AST 15 -- 16  ALT 15 -- 16  ALKPHOS 113 -- 117  BILITOT 0.7 -- 0.3   ------------------------------------------------------------------------------------------------------------------ estimated creatinine clearance is 40.1 ml/min (by C-G formula based on Cr of 1.49). ------------------------------------------------------------------------------------------------------------------ No results found for this basename: HGBA1C:2 in the last 72 hours ------------------------------------------------------------------------------------------------------------------  [facility] 01/29/12 0705  CHOL 138  HDL 31*  LDLCALC 82  TRIG 956  CHOLHDL 4.5  LDLDIRECT --   ------------------------------------------------------------------------------------------------------------------ No results found for this basename: TSH,T4TOTAL,FREET3,T3FREE,THYROIDAB in the last 72 hours ------------------------------------------------------------------------------------------------------------------ No results found for this basename: VITAMINB12:2,FOLATE:2,FERRITIN:2,TIBC:2,IRON:2,RETICCTPCT:2 in the last 72 hours  Coagulation profile  Lab 01/28/12 1830  INR 1.00  PROTIME --    No results found for this basename: DDIMER:2 in the last 72 hours  Cardiac Enzymes  Lab 01/28/12 1830  CKMB --  TROPONINI <0.30  MYOGLOBIN --   ------------------------------------------------------------------------------------------------------------------ No components found with this basename: POCBNP:3

## 2012-01-29 NOTE — Progress Notes (Signed)
Stroke Team Progress Note  HISTORY Tyler [name] is an 78 y.o. male who was in his normal state of health until about 1730 01/28/2012 when he was noted to have slurred speech. At that time also complained that his right arm did not feel right. Was felt to have right upper extremity numbness and weakness. EMS was called at that time and the patient was brought in as a code stroke. On presentation the RUE symptoms had resolved but he continued to have slurred speech. Patient was not a TPA candidate secondary to symptomimprovement. He was admitted for further evaluation and treatment.  SUBJECTIVE His wife is at the bedside. Patient is in vascular lab. Overall he feels his condition is slightly worse on the right side this am after having completely resolved last night. Thinks sx started around 3-4a 01/29/2012.  OBJECTIVE Most recent Vital Signs: Filed Vitals:   01/29/12 0100 01/29/12 0300 01/29/12 0600 01/29/12 0833  BP: 182/62 177/74 177/55 163/63  Pulse: 58 70 70 71  Temp:  97.7 F (36.5 C) 98 F (36.7 C) 97.6 F (36.4 C)  TempSrc:  Oral Oral Oral  Resp: 20 22 18 18   Height:      Weight:      SpO2: 99% 100% 98% 97%   CBG (last 3)   Basename 01/28/12 1850  GLUCAP 201*   MEDICATIONS    . aspirin  300 mg Rectal Daily   Or  . aspirin  325 mg Oral Daily  . enoxaparin  40 mg Subcutaneous Q24H  . glimepiride  4 mg Oral QAC breakfast  . influenza  inactive virus vaccine  0.5 mL Intramuscular Tomorrow-1000  . insulin aspart  0-9 Units Subcutaneous TID WC  . losartan  50 mg Oral Daily  . omega-3 acid ethyl esters  1 g Oral Daily  . pantoprazole  40 mg Oral Q1200  . simvastatin  5 mg Oral q1800   PRN:  senna-docusate  Diet:  Carb Control thin liquids Activity:  OOB  with assistance DVT Prophylaxis:  Lovenox 40 mg sq daily   CLINICALLY SIGNIFICANT STUDIES Basic Metabolic Panel:  Lab 01/29/12 0981 01/28/12 1830  NA 138 137  K 4.0 4.1  CL 104 103  CO2 24 22  GLUCOSE 107* 232*    BUN 22 26*  CREATININE 1.49* 1.61*  CALCIUM 9.8 9.5  MG -- --  PHOS -- --   Liver Function Tests:  Lab 01/29/12 0705 01/28/12 1830  AST 15 16  ALT 15 16  ALKPHOS 113 117  BILITOT 0.7 0.3  PROT 6.9 6.9  ALBUMIN 3.8 3.8   CBC:  Lab 01/29/12 0705 01/28/12 1830  WBC 8.4 8.2  NEUTROABS -- 4.1  HGB 13.7 13.4  HCT 39.5 39.0  MCV 89.8 91.3  PLT 173 154   Coagulation:  Lab 01/28/12 1830  LABPROT 13.1  INR 1.00   Cardiac Enzymes:  Lab 01/28/12 1830  CKTOTAL --  CKMB --  CKMBINDEX --  TROPONINI <0.30   Urinalysis: No results found for this basename: COLORURINE:2,APPERANCEUR:2,LABSPEC:2,PHURINE:2,GLUCOSEU:2,HGBUR:2,BILIRUBINUR:2,KETONESUR:2,PROTEINUR:2,UROBILINOGEN:2,NITRITE:2,LEUKOCYTESUR:2 in the last 168 hours Lipid Panel    Component Value Date/Time   CHOL 138 01/29/2012 0705   TRIG 125 01/29/2012 0705   HDL 31* 01/29/2012 0705   CHOLHDL 4.5 01/29/2012 0705   VLDL 25 01/29/2012 0705   LDLCALC 82 01/29/2012 0705   HgbA1C  No results found for this basename: HGBA1C    Urine Drug Screen:   No results found for this basename: labopia, cocainscrnur, labbenz, amphetmu,  thcu, labbarb    Alcohol Level: No results found for this basename: ETH:2 in the last 168 hours  CT of the brain  01/28/2012  No acute cortically based infarct or acute intracranial abnormality identified.   MRI of the brain    MRA of the brain    2D Echocardiogram    Carotid Doppler   No evidence of hemodynamically significant internal carotid artery stenosis. Vertebral artery flow is antegrade.   CXR  01/29/2012 Suboptimal inspiration accounts for mild atelectasis in the lower lobes.  No acute cardiopulmonary disease otherwise.    EKG  normal sinus rhythm.   Therapy Recommendations PT - ; OT -   Physical Exam  Pleasant elderly Caucasian male not in distress.Awake alert. Afebrile. Head is nontraumatic. Neck is supple without bruit. Hearing is normal. Cardiac exam no murmur or gallop. Lungs are clear  to auscultation. Distal pulses are well felt.  Neurological Exam : Awake alert oriented x 3 normal speech and language. Mild right lower face asymmetry. Tongue midline. Mild RUE and RLE drift.Rt hemiparesis 4/5. Mild diminished fine finger movements on right. Orbits left over right upper extremity. Mild right grip weak.. Normal sensation . Impaired rt finger to nose coordination. Gait deferred.  ASSESSMENT Tyler [name] is a 78 y.o. male presenting with right hemiparesis that improved in the ED. Symptoms worsened during the night after admission.  Imaging pending. Work up underway. On aspirin 81 mg orally every day prior to admission. Now on aspirin 325 mg orally every day for secondary stroke prevention. Patient now with right UE and LE hemiparesis, slurred speech.  Hypertension Diabetes, HgbA1c pending Hyperlipidemia, LDL 82, on statin PTA, at goal LDL < 100 Surgery last week on LLE for basal cell carcinoma Renal insufficiency, Cr 1.61  Hospital day # 1  TREATMENT/PLAN  Change aspirin to clopidogrel 75 mg orally every day for secondary stroke prevention.  F/u MRI, MRA, 2D, Carotid dopplers  OOB. Therapy evals.  Annie Main, MSN, RN, ANVP-BC, ANP-BC, [facility] Pager: [phone] 01/29/2012 9:54 AM  Scribe for Dr. Delia Heady, Stroke Center Medical Director, who has personally reviewed chart, pertinent data, examined the patient and developed the plan of care. Pager:  [phone]

## 2012-01-29 NOTE — Care Management Note (Signed)
    Page 1 of 1   02/02/2012     2:26:16 PM   CARE MANAGEMENT NOTE 02/02/2012  Patient:  Reale,Gianluca W   Account Number:  [id number]  Date Initiated:  01/29/2012  Documentation initiated by:  Onnie Boer  Subjective/Objective Assessment:   PT WAS ADMITTED WITH WEAKNESS     Action/Plan:   PROGRESSION OF CARE AND DISCHARGE PLANNING   Anticipated DC Date:  01/31/2012   Anticipated DC Plan:  HOME W HOME HEALTH SERVICES         Choice offered to / List presented to:             Status of service:  Completed, signed off Medicare Important Message given?   (If response is "NO", the following Medicare IM given date fields will be blank) Date Medicare IM given:   Date Additional Medicare IM given:    Discharge Disposition:  IP REHAB FACILITY  Per UR Regulation:  Reviewed for med. necessity/level of care/duration of stay  If discussed at Long Length of Stay Meetings, dates discussed:    Comments:  01/29/12 Onnie Boer, RN, BSN 1108  PT WAS ADMITTED WITH WEAKNESS, PTA PT WAS AT HOME WIHT SELF CARE.  WILL F/U WITH RECOMMENDATIONS AND DC NEEDS

## 2012-01-29 NOTE — Progress Notes (Signed)
*  PRELIMINARY RESULTS* Vascular Ultrasound Carotid Duplex (Doppler) has been completed.   No evidence of hemodynamically significant internal carotid artery stenosis bilaterally. Vertebral arteries are patent with antegrade flow.  01/29/2012 10:17 AM Gertie Fey, RDMS, RDCS

## 2012-01-29 NOTE — Evaluation (Signed)
Physical Therapy Evaluation Patient Details Name: Tyler [name] MRN: [id number] DOB: 1929/03/20 Today's Date: 01/29/2012 Time: 4098-1191 PT Time Calculation (min): 34 min  PT Assessment / Plan / Recommendation Clinical Impression  Pt admitted with L MCA CVA. Pt with good strength in RLE, although weaker than LLE and ataxic movements and motor planning during functional movement. Pt will benefit from skilled PT in the acute care setting in order to maximize functional mobility, strength and safety.     PT Assessment  Patient needs continued PT services    Follow Up Recommendations  Inpatient Rehab;Supervision for mobility/OOB    Barriers to Discharge        Equipment Recommendations  Rolling walker with 5" wheels (TBD pending pt progress)    Recommendations for Other Services Rehab consult   Frequency Min 4X/week    Precautions / Restrictions Precautions Precautions: Fall Precaution Comments: ' Restrictions Weight Bearing Restrictions: No   Pertinent Vitals/Pain No complaints of pain      Mobility  Bed Mobility Bed Mobility: Supine to Sit;Sitting - Scoot to Edge of Bed Supine to Sit: 3: Mod assist Sitting - Scoot to Edge of Bed: 3: Mod assist Details for Bed Mobility Assistance: Assist to support trunk OOB and to scoot right hip to EOB. Transfers Transfers: Sit to Stand;Stand to Sit;Stand Pivot Transfers Sit to Stand: 3: Mod assist;From chair/3-in-1;With upper extremity assist Stand to Sit: 4: Min assist;To chair/3-in-1;With upper extremity assist Stand Pivot Transfers: 3: Mod assist Details for Transfer Assistance: Mod assist for stability into standing and assist to control descent. Cueing for proper hand placement and safety. Transfer from bed to chair to the left with assist to maintain stability and proper placement of RLE. Ambulation/Gait Ambulation/Gait Assistance: 1: +2 Total assist Ambulation/Gait: Patient Percentage: 60% Ambulation Distance (Feet): 10  Feet Assistive device: Rolling walker Ambulation/Gait Assistance Details: VC for proper sequencing and safety with RW. +2 assist for stability. Pt with no buckling noted although assist at RLE secondary to ataxic movements and pt unable to stabilize on RLE. Manual faciliation at hip for initiation of movement and knee and ankle for gait stability.  Gait Pattern: Ataxic;Trunk flexed;Decreased stride length;Decreased hip/knee flexion - right;Decreased weight shift to right;Step-to pattern Gait velocity: decreased gait speed Modified Rankin (Stroke Patients Only) Pre-Morbid Rankin Score: No symptoms Modified Rankin: Moderately severe disability    Shoulder Instructions     Exercises     PT Diagnosis: Abnormality of gait;Generalized weakness  PT Problem List: Decreased strength;Decreased activity tolerance;Decreased balance;Decreased mobility;Decreased knowledge of use of DME;Decreased safety awareness;Decreased coordination;Decreased knowledge of precautions;Impaired sensation PT Treatment Interventions: DME instruction;Gait training;Functional mobility training;Therapeutic activities;Balance training;Stair training;Therapeutic exercise;Neuromuscular re-education;Patient/family education   PT Goals Acute Rehab PT Goals PT Goal Formulation: With patient/family Time For Goal Achievement: 02/12/12 Potential to Achieve Goals: Good Pt will go Supine/Side to Sit: with modified independence PT Goal: Supine/Side to Sit - Progress: Goal set today Pt will go Sit to Supine/Side: with modified independence PT Goal: Sit to Supine/Side - Progress: Goal set today Pt will go Sit to Stand: with supervision PT Goal: Sit to Stand - Progress: Goal set today Pt will go Stand to Sit: with supervision PT Goal: Stand to Sit - Progress: Goal set today Pt will Transfer Bed to Chair/Chair to Bed: with supervision PT Transfer Goal: Bed to Chair/Chair to Bed - Progress: Goal set today Pt will Ambulate: >150  feet;with supervision;with least restrictive assistive device PT Goal: Ambulate - Progress: Goal set today  Visit Information  Last PT Received On: 01/29/12 Assistance Needed: +2 PT/OT Co-Evaluation/Treatment: Yes    Subjective Data  Patient Stated Goal: to get better   Prior Functioning  Home Living Lives With: Spouse Available Help at Discharge: Family;Available PRN/intermittently (wife works during the day) Type of Home: House Home Access: Level entry Home Layout: One level (2 steps inside house, no rails) Bathroom Shower/Tub: Forensic scientist: Standard Bathroom Accessibility: Yes How Accessible: Accessible via walker Home Adaptive Equipment: Straight cane Prior Function Level of Independence: Independent Able to Take Stairs?: Yes Driving: Yes Vocation: Retired Comments: Agricultural consultant at [employer] Communication: Expressive difficulties Dominant Hand: Right    Cognition  Overall Cognitive Status: Appears within functional limits for tasks assessed/performed Arousal/Alertness: Awake/alert Orientation Level: Oriented X4 / Intact Behavior During Session: [facility] for tasks performed    Extremity/Trunk Assessment Right Upper Extremity Assessment RUE ROM/Strength/Tone: Deficits RUE ROM/Strength/Tone Deficits: 3/5 digit flexion/extension.  3+/5 throughout wrist, elbow, and shoulder. RUE Sensation: Deficits RUE Sensation Deficits: tingling RUE Coordination: Deficits RUE Coordination Deficits: ataxic Left Upper Extremity Assessment LUE ROM/Strength/Tone: Within functional levels LUE Sensation: WFL - Light Touch;WFL - Proprioception LUE Coordination: WFL - gross/fine motor Right Lower Extremity Assessment RLE ROM/Strength/Tone: Deficits RLE ROM/Strength/Tone Deficits: strength grossly 4/5 although unable to maintain control functionally RLE Sensation: Deficits RLE Sensation Deficits: tingling although less than RUE RLE Coordination:  Deficits RLE Coordination Deficits: ataxic movements during gait Left Lower Extremity Assessment LLE ROM/Strength/Tone: Within functional levels LLE Sensation: WFL - Light Touch   Balance Balance Balance Assessed: Yes Static Sitting Balance Static Sitting - Balance Support: Feet supported Static Sitting - Level of Assistance: 4: Min assist Static Sitting - Comment/# of Minutes: Pt sat EOB with min assist due to posterior lean.  Pt sometimes able to correct with VC but often required tactile cueing to obtain midline orientation. Static Standing Balance Static Standing - Balance Support: Bilateral upper extremity supported Static Standing - Level of Assistance: 3: Mod assist Static Standing - Comment/# of Minutes: Assist to trunk to maintain balance in midline with manual facilitation to RLE for stabilization.   End of Session PT - End of Session Equipment Utilized During Treatment: Gait belt Activity Tolerance: Patient tolerated treatment well Patient left: in chair;with call bell/phone within reach;with family/visitor present Nurse Communication: Mobility status  GP     Milana Kidney 01/29/2012, 5:45 PM  01/29/2012 Milana Kidney DPT PAGER: [phone] OFFICE: [phone]

## 2012-01-30 LAB — BASIC METABOLIC PANEL
BUN: 22 mg/dL (ref 6–23)
Chloride: 102 mEq/L (ref 96–112)
Creatinine, Ser: 1.42 mg/dL — ABNORMAL HIGH (ref 0.50–1.35)
GFR calc Af Amer: 52 mL/min — ABNORMAL LOW (ref >90)
GFR calc non Af Amer: 45 mL/min — ABNORMAL LOW (ref >90)
Glucose, Bld: 140 mg/dL — ABNORMAL HIGH (ref 70–99)

## 2012-01-30 LAB — GLUCOSE, CAPILLARY
Glucose-Capillary: 135 mg/dL — ABNORMAL HIGH (ref 70–99)
Glucose-Capillary: 171 mg/dL — ABNORMAL HIGH (ref 70–99)

## 2012-01-30 LAB — TROPONIN I: Troponin I: <0.3 ng/mL

## 2012-01-30 LAB — MAGNESIUM: Magnesium: 2.3 mg/dL (ref 1.5–2.5)

## 2012-01-30 NOTE — Progress Notes (Signed)
Physical Therapy Treatment Patient Details Name: Tyler [name] MRN: [id number] DOB: 1929-01-10 Today's Date: 01/30/2012 Time: 7829-5621 PT Time Calculation (min): 25 min  PT Assessment / Plan / Recommendation Comments on Treatment Session  Pt progressing well. Emphasized motor control this session both im ambulation and through therex. Pt highly motivated for progression daily. Continue with gait improvements for safety and balance.     Follow Up Recommendations  Inpatient Rehab;Supervision for mobility/OOB    Barriers to Discharge        Equipment Recommendations  Rolling walker with 5" wheels    Recommendations for Other Services Rehab consult  Frequency Min 4X/week   Plan Discharge plan remains appropriate;Frequency remains appropriate    Precautions / Restrictions Precautions Precautions: Fall Restrictions Weight Bearing Restrictions: No   Pertinent Vitals/Pain Pt with no pain complaints.     Mobility  Bed Mobility Bed Mobility: Not assessed Transfers Transfers: Sit to Stand;Stand to Sit;Stand Pivot Transfers Sit to Stand: 4: Min assist;With upper extremity assist;From chair/3-in-1 Stand to Sit: 4: Min assist;With upper extremity assist;To chair/3-in-1 Details for Transfer Assistance: Min assist for stability through RLE. Pt able to control ascent/descent more today. Ambulation/Gait Ambulation/Gait Assistance: 1: +2 Total assist Ambulation/Gait: Patient Percentage: 70% Ambulation Distance (Feet): 20 Feet Assistive device: Large base quad cane Ambulation/Gait Assistance Details: Assist x 1 for stability while other assist with proper placement of RLE as well as assist to maintain stability of R knee to avoid hyperextension and buckling. RUE supported by therapist as well as stability to avoid right anterior lean during ambulation. Manual facilitation through hip for control of placement, and ankle to avoid eversion steppage.  Gait Pattern: Ataxic;Trunk flexed;Decreased  stride length;Decreased hip/knee flexion - right;Decreased weight shift to right;Step-to pattern Gait velocity: decreased gait speed Modified Rankin (Stroke Patients Only) Modified Rankin: Moderately severe disability    Exercises General Exercises - Lower Extremity Long Arc Quad: AROM;Right;10 reps;Seated (5 second accelaration/decel for control) Heel Slides: AROM;Right;10 reps;Supine (5 second accelaration/decel for control) Straight Leg Raises: AAROM;Strengthening;Right;10 reps;Supine;Limitations (5 second accelaration/decel for control) Straight Leg Raises Limitations: minimal asssist given for weight of leg to maintain knee extension during exercise   PT Diagnosis:    PT Problem List:   PT Treatment Interventions:     PT Goals Acute Rehab PT Goals PT Goal: Sit to Stand - Progress: Progressing toward goal PT Goal: Stand to Sit - Progress: Progressing toward goal PT Transfer Goal: Bed to Chair/Chair to Bed - Progress: Progressing toward goal PT Goal: Ambulate - Progress: Progressing toward goal  Visit Information  Last PT Received On: 01/30/12 Assistance Needed: +2 PT/OT Co-Evaluation/Treatment: Yes    Subjective Data      Cognition  Overall Cognitive Status: Appears within functional limits for tasks assessed/performed Arousal/Alertness: Awake/alert Orientation Level: Oriented X4 / Intact Behavior During Session: [facility] for tasks performed    Balance  Static Standing Balance Static Standing - Balance Support: Left upper extremity supported Static Standing - Level of Assistance: 4: Min assist;3: Mod assist Static Standing - Comment/# of Minutes: Encouraged pt to maintain balance through L side with L quad cane in standing. Pt able to maintain balance independenlty for ~5 seconds without any assist. Assist to avoid anterior right lean.   End of Session PT - End of Session Equipment Utilized During Treatment: Gait belt Activity Tolerance: Patient tolerated treatment  well Patient left: in chair;with call bell/phone within reach;with family/visitor present Nurse Communication: Mobility status   GP     [place], Richelle Ito  01/30/2012, 5:24 PM  01/30/2012 Milana Kidney DPT PAGER: [phone] OFFICE: [phone]

## 2012-01-30 NOTE — Progress Notes (Addendum)
Stroke Team Progress Note  HISTORY Tyler [name] is an 78 y.o. male who was in his normal state of health until about 1730 01/28/2012 when he was noted to have slurred speech. At that time also complained that his right arm did not feel right. Was felt to have right upper extremity numbness and weakness. EMS was called at that time and the patient was brought in as a code stroke. On presentation the RUE symptoms had resolved but he continued to have slurred speech. Patient was not a TPA candidate secondary to symptomimprovement. He was admitted for further evaluation and treatment.  SUBJECTIVE No complaints. No pain. Feels better than yesterday. Tolerating PO.  OBJECTIVE Most recent Vital Signs: Filed Vitals:   01/29/12 2330 01/30/12 0250 01/30/12 0642 01/30/12 0929  BP: 160/60 155/65 142/72 136/57  Pulse: 63 69 59 98  Temp: 98 F (36.7 C) 98.1 F (36.7 C) 97.8 F (36.6 C) 97.6 F (36.4 C)  TempSrc: Oral Oral Oral Oral  Resp: 18 20 18 18   Height:      Weight:      SpO2: 96% 96% 97% 98%   GENERAL EXAM: Patient is in no distress  CARDIOVASCULAR: Regular rate and rhythm, no murmurs, no carotid bruits  NEUROLOGIC: MENTAL STATUS: awake, alert, language fluent, comprehension intact, naming intact CRANIAL NERVE: pupils equal and reactive to light, visual fields full to confrontation, extraocular muscles intact, no nystagmus, facial sensation symmetric, uvula midline, shoulder shrug symmetric, tongue midline; MILD DYSARTHRIA. RIGHT LOWER FACIAL WEAKNESS.  MOTOR: normal bulk and tone, full strength in the LUE, BLE. RUE 3-4/5.  SENSORY: normal and symmetric to light touch, pinprick, temperature, vibration and proprioception COORDINATION: RUE DYSMETRIA, SLOW FFM IN RIGHT HAND. REFLEXES: deep tendon reflexes present and symmetric  CBG (last 3)   Basename 01/30/12 0756 01/29/12 2329 01/29/12 1642  GLUCAP 135* 160* 135*   MEDICATIONS     . clopidogrel  75 mg Oral Q breakfast  .  enoxaparin  40 mg Subcutaneous Q24H  . glimepiride  4 mg Oral QAC breakfast  . influenza  inactive virus vaccine  0.5 mL Intramuscular Tomorrow-1000  . insulin aspart  0-9 Units Subcutaneous TID WC  . magnesium sulfate 1 - 4 g bolus IVPB  2 g Intravenous Once  . metoprolol tartrate  12.5 mg Oral BID  . omega-3 acid ethyl esters  1 g Oral Daily  . pantoprazole  40 mg Oral Q1200  . simvastatin  5 mg Oral q1800  . DISCONTD: losartan  50 mg Oral Daily  . DISCONTD: metoprolol tartrate  25 mg Oral BID   PRN:  senna-docusate  Diet:  Carb Control thin liquids Activity:  OOB  with assistance DVT Prophylaxis:  Lovenox 40 mg sq daily   CLINICALLY SIGNIFICANT STUDIES Basic Metabolic Panel:   Lab 01/30/12 0623 01/29/12 1626  NA 135 137  K 4.5 4.1  CL 102 102  CO2 23 22  GLUCOSE 140* 138*  BUN 22 22  CREATININE 1.42* 1.41*  CALCIUM 9.8 9.8  MG 2.3 2.0  PHOS -- 3.0   Liver Function Tests:   Lab 01/29/12 0705 01/28/12 1830  AST 15 16  ALT 15 16  ALKPHOS 113 117  BILITOT 0.7 0.3  PROT 6.9 6.9  ALBUMIN 3.8 3.8   CBC:   Lab 01/29/12 0705 01/28/12 1841 01/28/12 1830  WBC 8.4 -- 8.2  NEUTROABS -- -- 4.1  HGB 13.7 13.6 --  HCT 39.5 40.0 --  MCV 89.8 -- 91.3  PLT 173 -- 154   Coagulation:   Lab 01/28/12 1830  LABPROT 13.1  INR 1.00   Cardiac Enzymes:   Lab 01/30/12 0623 01/29/12 2134 01/29/12 1626  CKTOTAL -- -- --  CKMB -- -- --  CKMBINDEX -- -- --  TROPONINI <0.30 <0.30 <0.30   Urinalysis: No results found for this basename: COLORURINE:2,APPERANCEUR:2,LABSPEC:2,PHURINE:2,GLUCOSEU:2,HGBUR:2,BILIRUBINUR:2,KETONESUR:2,PROTEINUR:2,UROBILINOGEN:2,NITRITE:2,LEUKOCYTESUR:2 in the last 168 hours Lipid Panel    Component Value Date/Time   CHOL 138 01/29/2012 0705   TRIG 125 01/29/2012 0705   HDL 31* 01/29/2012 0705   CHOLHDL 4.5 01/29/2012 0705   VLDL 25 01/29/2012 0705   LDLCALC 82 01/29/2012 0705   HgbA1C  Lab Results  Component Value Date   HGBA1C 6.7* 01/29/2012     Urine Drug Screen:   No results found for this basename: labopia,  cocainscrnur,  labbenz,  amphetmu,  thcu,  labbarb    Alcohol Level: No results found for this basename: ETH:2 in the last 168 hours  CT of the brain  01/28/2012  No acute cortically based infarct or acute intracranial abnormality identified.   MRI of the brain  01/29/12 - acute ischemic infarct posterior left lenticular nucleus extending into the posterior aspect of the left corona radiata - moderate diffuse atrophy - moderate chronic small vessel ischemic disease - small amount of blood breakdown products posterior right lenticular nucleus consistent with remote infarct or hemorrhage  MRA of the brain  01/29/12 - Slight irregularity basilar artery without high-grade stenosis. Moderate narrowing proximal right posterior cerebral artery. Posterior cerebral artery branch vessel irregularity bilaterally. Mild narrowing proximal left posterior cerebral artery. Mild irregularity superior cerebellar artery bilaterally.  - 1.4 mm aneurysm posterior lateral aspect left internal carotid artery and proximal cavernous segment.  2D Echocardiogram  estimated ejection fraction was in the range of 65% to 70%. Doppler parameters are consistent with abnormal left ventricular relaxation (grade 1 diastolic dysfunction).  Carotid Doppler   No evidence of hemodynamically significant internal carotid artery stenosis. Vertebral artery flow is antegrade.   CXR  01/29/2012 Suboptimal inspiration accounts for mild atelectasis in the lower lobes.  No acute cardiopulmonary disease otherwise.    EKG  normal sinus rhythm.   Therapy Recommendations  PT --> Inpatient Rehab;Supervision for mobility/OOB  OT --> Inpatient Rehab   ASSESSMENT Mr. Tyler [name] is a 78 y.o. male presenting with right hemiparesis that improved in the ED. Symptoms worsened during the night after admission.  On aspirin 81 mg orally every day prior to admission. Now on plavix  75mg  orally every day for secondary stroke prevention.   Dx: acute lacunar infarction (left posterior left lenticular nucleus extending into the posterior aspect of the left corona radiata) due to small vessel thrombosis.  Hypertension Diabetes, HgbA1c pending Hyperlipidemia, LDL 82, on statin PTA, at goal LDL < 100 Surgery last week on LLE for basal cell carcinoma Renal insufficiency, Cr 1.61  Hospital day # 2  TREATMENT/PLAN  continue clopidogrel 75 mg orally every day and statin, diabetes and BP control for secondary stroke prevention.  OOB. Therapy evals --> Inpatient rehab  Results and plan discussed with patient and wife at bedside  Will sign off  [facility] - Stroke Team Joycelyn Schmid, MD 01/30/2012, 11:08 AM   Please refer to [email or web address] for on-call Stroke MD

## 2012-01-31 LAB — GLUCOSE, CAPILLARY
Glucose-Capillary: 114 mg/dL — ABNORMAL HIGH (ref 70–99)
Glucose-Capillary: 137 mg/dL — ABNORMAL HIGH (ref 70–99)
Glucose-Capillary: 183 mg/dL — ABNORMAL HIGH (ref 70–99)

## 2012-01-31 LAB — BASIC METABOLIC PANEL
BUN: 28 mg/dL — ABNORMAL HIGH (ref 6–23)
CO2: 24 mEq/L (ref 19–32)
Calcium: 10.2 mg/dL (ref 8.4–10.5)
Creatinine, Ser: 1.52 mg/dL — ABNORMAL HIGH (ref 0.50–1.35)

## 2012-01-31 LAB — MAGNESIUM: Magnesium: 2.2 mg/dL (ref 1.5–2.5)

## 2012-01-31 MED ORDER — POLYETHYLENE GLYCOL 3350 17 G PO PACK
17.0000 g | PACK | Freq: Once | ORAL | Status: AC
  Administered 2012-01-31: 17 g via ORAL
  Filled 2012-01-31: qty 1

## 2012-01-31 MED ORDER — MAGNESIUM HYDROXIDE 400 MG/5ML PO SUSP
30.0000 mL | Freq: Every day | ORAL | Status: DC | PRN
  Administered 2012-01-31: 30 mL via ORAL
  Filled 2012-01-31: qty 30

## 2012-01-31 NOTE — Progress Notes (Signed)
Informed by monitor tech that patient had a 7 beat Ventricular trigeminy. Pt asymptomatic, no complaints. MD Thedore Mins paged, new orders received  Minor, Morrie Sheldon Manaal Mandala

## 2012-01-31 NOTE — Progress Notes (Signed)
Results of EKG text paged to MD Thedore Mins.  Tyler [name], Tyler [name]

## 2012-01-31 NOTE — Progress Notes (Signed)
[facility]                                                                                Patient Demographics  Tyler [name], is a 78 y.o. male  ZOX:[id number]  [id number]  DOB - 1928/12/17  Admit date - 01/28/2012  Admitting Physician Cristal Ford, MD  Outpatient Primary MD for the patient is Michiel Sites, MD  LOS - 3   Chief Complaint  Patient presents with  . Code Stroke        Assessment & Plan    #1. L.MCA Ischemic CVA - continue neurochecks, stable swallow, will need speech assistance, noted MRI/MRA brain, stable carotid Doppler and 2-D echo. Monitor shows sinus rhythm. Plavix continue, Neuro following. PT-OT and Speech input. Noted small aneurysm on MRA discussed with Dr. Darrick Penna vascular surgeon no further followup or recommendations at this time.    #2. Diabetes mellitus type 2 - continue Amaryl and will place on sliding-scale coverage. Patient is on a research medication for diabetes which will be on hold.   Lab Results  Component Value Date   HGBA1C 6.7* 01/29/2012    CBG (last 3)   Basename 01/31/12 0742 01/30/12 2132 01/30/12 1610  GLUCAP 114* 173* 100*      #3. Hypertension - permissive HTN due to CVA, resume,placed on low-dose beta blocker monitor blood pressures.    #4. Chronic kidney disease stage 4, Craet at baseline around 1.6, ARB resume in 48hrs.    #5. Hyperlipidemia - continue home medications. LDL<100   Lab Results  Component Value Date   CHOL 138 01/29/2012   HDL 31* 01/29/2012   LDLCALC 82 01/29/2012   TRIG 125 01/29/2012   CHOLHDL 4.5 01/29/2012     #6. History of prostate cancer status post surgery.    #7. Ventricular trigeminy on telemetry, patient asymptomatic, EF is about 60%, will check electrolytes including magnesium, patient already on beta blocker, repeat baseline EKG ,  negative 3 sets of troponin.    Code Status: Full  Family Communication: D/W patient  Disposition Plan:   Rehabilitation versus C. IR     Procedures   MRI-A,Echo,Carotid US   Consults  Neuro,PT,OT,Speech   Time Spent in minutes   35   Antibiotics    Anti-infectives    None      Scheduled Meds:    . clopidogrel  75 mg Oral Q breakfast  . enoxaparin  40 mg Subcutaneous Q24H  . glimepiride  4 mg Oral QAC breakfast  . insulin aspart  0-9 Units Subcutaneous TID WC  . metoprolol tartrate  12.5 mg Oral BID  . omega-3 acid ethyl esters  1 g Oral Daily  . pantoprazole  40 mg Oral Q1200  . polyethylene glycol  17 g Oral Once  . simvastatin  5 mg Oral q1800   Continuous Infusions:  PRN Meds:.magnesium hydroxide, senna-docusate   DVT Prophylaxis  SCDs    Susa Raring K M.D on 01/31/2012 at 10:06 AM  Between 7am to 7pm - Pager - [phone]  After 7pm go to [email or web address] - password [email or web address]  And look for the night coverage person covering for me after hours  [facility] Office  [phone]    Subjective:   Taysean Wager today has, No headache, No chest pain, No abdominal pain - No Nausea, No new weakness tingling or numbness, No Cough - SOB.    Objective:   Filed Vitals:   01/30/12 1821 01/30/12 2136 01/31/12 0116 01/31/12 0523  BP: 136/65 137/74 152/60 138/63  Pulse: 61 63 59 65  Temp: 98.6 F (37 C) 97.4 F (36.3 C) 97.9 F (36.6 C) 98 F (36.7 C)  TempSrc: Oral Oral Oral Oral  Resp: 18 16 16 16   Height:      Weight:    83.6 kg (184 lb 4.9 oz)  SpO2: 98% 96% 98% 97%    Wt Readings from Last 3 Encounters:  01/31/12 83.6 kg (184 lb 4.9 oz)     Intake/Output Summary (Last 24 hours) at 01/31/12 1006 Last data filed at 01/31/12 0503  Gross per 24 hour  Intake      0 ml  Output    300 ml  Net   -300 ml    Exam Awake Alert, Oriented X 3, No new F.N deficits, Rt facial droop, Rt Arm strength 4/5, Normal affect [place].AT,[name],[name], [name].  Symmetrical Chest wall movement, Good air movement  bilaterally, CTAB RRR,No Gallops,Rubs or new Murmurs, No Parasternal Heave +ve B.Sounds, Abd Soft, Non tender, No organomegaly appriciated, No rebound - guarding or rigidity. No Cyanosis, Clubbing or edema, No new Rash or bruise      Data Review   Micro Results No results found for this or any previous visit (from the past 240 hour(s)).  Radiology Reports Dg Chest 2 View  01/29/2012  *RADIOLOGY REPORT*  Clinical Data: Generalized weakness.  CHEST - 2 VIEW  Comparison: Two-view chest x-ray 07/08/2010, 03/24/2010, 11/28/2009.  Findings: Cardiac silhouette upper normal in size for the AP portable technique, unchanged.  Cardiac silhouette normal in size for the AP technique, unchanged.  Thoracic aorta mildly atherosclerotic, unchanged.  Hilar and mediastinal contours otherwise unremarkable.  Suboptimal inspiration accounts for mild atelectasis in the lower lobes.  Lungs otherwise clear.  No pleural effusions.  Degenerative changes involving the thoracic spine.  IMPRESSION: Suboptimal inspiration accounts for mild atelectasis in the lower lobes.  No acute cardiopulmonary disease otherwise.   Original Report Authenticated By: Arnell Sieving, M.D.    Ct Head (brain) Wo Contrast  01/28/2012  *RADIOLOGY REPORT*  Clinical Data: 78 year old male with right side weakness and facial droop.  Code stroke.  CT HEAD WITHOUT CONTRAST  Technique:  Contiguous axial images were obtained from the base of the skull through the vertex without contrast.  Comparison: Brain MRI 08/12/2006.  Findings: Visualized paranasal sinuses and mastoids are clear.  No acute orbit or scalp soft tissue findings. No acute osseous abnormality identified.  Chronic sulcal variation at the left superior frontal gyrus is stable.  Stable cerebral volume.  No ventriculomegaly. No evidence of cortically based acute infarction identified.  No acute intracranial hemorrhage identified.  No suspicious intracranial vascular hyperdensity.  Largely  normal for age gray-white matter differentiation throughout the brain.  IMPRESSION: No acute cortically based infarct or acute intracranial abnormality identified.  Critical Value/emergent results were called by telephone at the time of interpretation on 01/28/2012 at 1847 hours to Dr. Aubery Lapping, who verbally acknowledged these results.   Original Report Authenticated By: Harley Hallmark, M.D.     CBC  Lab 01/29/12 0705 01/28/12 1841 01/28/12 1830  WBC 8.4 -- 8.2  HGB  13.7 13.6 13.4  HCT 39.5 40.0 39.0  PLT 173 -- 154  MCV 89.8 -- 91.3  MCH 31.1 -- 31.4  MCHC 34.7 -- 34.4  RDW 12.8 -- 13.2  LYMPHSABS -- -- 3.2  MONOABS -- -- 0.5  EOSABS -- -- 0.4  BASOSABS -- -- 0.0  BANDABS -- -- --    Chemistries   Lab 01/30/12 0623 01/29/12 1626 01/29/12 0705 01/28/12 1841 01/28/12 1830  NA 135 137 138 140 137  K 4.5 4.1 4.0 4.1 4.1  CL 102 102 104 106 103  CO2 23 22 24  -- 22  GLUCOSE 140* 138* 107* 223* 232*  BUN 22 22 22  28* 26*  CREATININE 1.42* 1.41* 1.49* 1.70* 1.61*  CALCIUM 9.8 9.8 9.8 -- 9.5  MG 2.3 2.0 -- -- --  AST -- -- 15 -- 16  ALT -- -- 15 -- 16  ALKPHOS -- -- 113 -- 117  BILITOT -- -- 0.7 -- 0.3   ------------------------------------------------------------------------------------------------------------------ estimated creatinine clearance is 42.1 ml/min (by C-G formula based on Cr of 1.42). ------------------------------------------------------------------------------------------------------------------  [facility] 01/29/12 0705  HGBA1C 6.7*   ------------------------------------------------------------------------------------------------------------------  Basename 01/29/12 0705  CHOL 138  HDL 31*  LDLCALC 82  TRIG 213  CHOLHDL 4.5  LDLDIRECT --   ------------------------------------------------------------------------------------------------------------------  Alvira Philips 01/29/12 0705  TSH 3.053  T4TOTAL --  T3FREE --  THYROIDAB --    ------------------------------------------------------------------------------------------------------------------ No results found for this basename: VITAMINB12:2,FOLATE:2,FERRITIN:2,TIBC:2,IRON:2,RETICCTPCT:2 in the last 72 hours  Coagulation profile  Lab 01/28/12 1830  INR 1.00  PROTIME --    No results found for this basename: DDIMER:2 in the last 72 hours  Cardiac Enzymes  Lab 01/30/12 0623 01/29/12 2134 01/29/12 1626  CKMB -- -- --  TROPONINI <0.30 <0.30 <0.30  MYOGLOBIN -- -- --   ------------------------------------------------------------------------------------------------------------------ No components found with this basename: POCBNP:3

## 2012-02-01 DIAGNOSIS — I6330 Cerebral infarction due to thrombosis of unspecified cerebral artery: Secondary | ICD-10-CM

## 2012-02-01 LAB — GLUCOSE, CAPILLARY
Glucose-Capillary: 133 mg/dL — ABNORMAL HIGH (ref 70–99)
Glucose-Capillary: 161 mg/dL — ABNORMAL HIGH (ref 70–99)
Glucose-Capillary: 97 mg/dL (ref 70–99)

## 2012-02-01 MED ORDER — LOSARTAN POTASSIUM 25 MG PO TABS: 50.0000 mg | ORAL_TABLET | Freq: Every day | ORAL | Status: DC

## 2012-02-01 MED ORDER — METOPROLOL TARTRATE 12.5 MG HALF TABLET: 12.5000 mg | ORAL_TABLET | Freq: Two times a day (BID) | ORAL | Status: DC

## 2012-02-01 MED ORDER — CLOPIDOGREL BISULFATE 75 MG PO TABS: 75.0000 mg | ORAL_TABLET | Freq: Every day | ORAL | Status: DC

## 2012-02-01 NOTE — H&P (Signed)
Physical Medicine and Rehabilitation Admission H&P    Chief Complaint  Patient presents with  . Code Stroke  : HPI: Tyler [name] is a 78 y.o. right-handed male with history of hypertension, chronic renal insufficiency with baseline creatinine 1.7. Admitted 01/28/2012 with right sided weakness and slurred speech. MRI of the brain showed acute nonhemorrhagic infarct posterior left lenticular nucleus extending into the posterior aspect of the left corona radiata. MRA of the head with intracranial atherosclerotic type changes as well 1.4 mm aneurysm posterior lateral aspect left internal carotid artery. Echocardiogram with ejection fraction of 70% and grade 1 diastolic dysfunction. Carotid Dopplers with no ICA stenosis. Patient did not receive TPA. Neurology services consulted placed on Plavix therapy for stroke prophylaxis.  Dr. fields vascular surgery notified in regards to small aneurysm identified on MRA advised no surgical intervention or further workup at this time. Findings of mildly elevated hemoglobin A1c of 6.7 and placed on Amaryl with monitoring of blood sugars as well as diabetic diet. Physical therapy ongoing with occupational therapy evaluations pending with recommendations of physical medicine rehabilitation consult to consider inpatient rehabilitation services. Patient was felt to be a good candidate for inpatient rehabilitation services and was admitted for comprehensive rehabilitation program  Review of Systems  Gastrointestinal: Positive for constipation.  Musculoskeletal: Positive for myalgias.  Neurological: Positive for weakness.  All other systems reviewed and are negative   Past Medical History  Diagnosis Date  . Hypertension   . Cancer    Past Surgical History  Procedure Date  . Prostate surgery   . Cardiac catheterization    History reviewed. No pertinent family history. Social History:  reports that he quit smoking about 43 years ago. He does not have any  smokeless tobacco history on file. He reports that he does not drink alcohol or use illicit drugs. Allergies:  Allergies  Allergen Reactions  . Sulfa Drugs Cross Reactors Rash   Medications Prior to Admission  Medication Sig Dispense Refill  . Cholecalciferol (VITAMIN D-3 PO) Take 1 tablet by mouth daily.      . Cyanocobalamin (VITAMIN B-12 PO) Take 1 tablet by mouth daily.      . fish oil-omega-3 fatty acids 1000 MG capsule Take 1 g by mouth daily.      . Flaxseed, Linseed, (FLAX SEEDS PO) Take 1 tablet by mouth daily.      . glimepiride (AMARYL) 4 MG tablet Take 4 mg by mouth daily before breakfast.      . pantoprazole (PROTONIX) 40 MG tablet Take 40 mg by mouth daily.      . pravastatin (PRAVACHOL) 10 MG tablet Take 10 mg by mouth daily.      . STUDY MEDICATION Inject 9 Units into the skin daily.      . DISCONTD: aspirin EC 81 MG tablet Take 81 mg by mouth daily.      . DISCONTD: losartan (COZAAR) 50 MG tablet Take 50 mg by mouth daily.        Home: Home Living Lives With: Spouse Available Help at Discharge: Family;Available PRN/intermittently (wife works during the day) Type of Home: House Home Access: Level entry Home Layout: One level (2 steps inside house, no rails) Bathroom Shower/Tub: Tub/shower unit;Curtain Bathroom Toilet: Standard Bathroom Accessibility: Yes How Accessible: Accessible via walker Home Adaptive Equipment: Straight cane   Functional History: Prior Function Able to Take Stairs?: Yes Driving: Yes Vocation: Retired Comments: volunteer at church  Functional Status:  Mobility: Bed Mobility Bed Mobility: Not assessed Supine   to Sit: 3: Mod assist Sitting - Scoot to Edge of Bed: 3: Mod assist Transfers Transfers: Sit to Stand;Stand to Sit;Stand Pivot Transfers Sit to Stand: 4: Min assist;With upper extremity assist;From chair/3-in-1 Stand to Sit: 4: Min assist;With upper extremity assist;To chair/3-in-1 Stand Pivot Transfers: 3: Mod  assist Ambulation/Gait Ambulation/Gait Assistance: 1: +2 Total assist Ambulation/Gait: Patient Percentage: 70% Ambulation Distance (Feet): 20 Feet Assistive device: Large base quad cane Ambulation/Gait Assistance Details: Assist x 1 for stability while other assist with proper placement of RLE as well as assist to maintain stability of R knee to avoid hyperextension and buckling. RUE supported by therapist as well as stability to avoid right anterior lean during ambulation. Manual facilitation through hip for control of placement, and ankle to avoid eversion steppage.  Gait Pattern: Ataxic;Trunk flexed;Decreased stride length;Decreased hip/knee flexion - right;Decreased weight shift to right;Step-to pattern Gait velocity: decreased gait speed    ADL: ADL Upper Body Dressing: Performed;Minimal assistance Where Assessed - Upper Body Dressing: Supported sitting Lower Body Dressing: Performed;Moderate assistance Where Assessed - Lower Body Dressing: Supported sitting Toilet Transfer: Simulated;Other (comment);Moderate assistance (performed sit to stand  x 4) Toilet Transfer Method: Sit to stand;Other (comment) (very cues and hands on assist for controlled descent) Toilet Transfer Equipment:  (bed to chair) Equipment Used: Gait belt Transfers/Ambulation Related to ADLs: Pt participated in functional sit to stand in prep for increased I with ADL activity. Pt tended to plop in chairs and nothave L hand placed on arm rest for stand to sit transition. ADL Comments: decreased balance sitting EOB for ADLs.  Cognition: Cognition Overall Cognitive Status: Appears within functional limits for tasks assessed Arousal/Alertness: Awake/alert Orientation Level: Oriented X4 Attention: Focused Focused Attention: Appears intact Memory: Appears intact Awareness: Appears intact Problem Solving: Appears intact Safety/Judgment: Appears intact Cognition Overall Cognitive Status: Appears within functional  limits for tasks assessed/performed Arousal/Alertness: Awake/alert Orientation Level: Oriented X4 / Intact Behavior During Session: WFL for tasks performed   Blood pressure 155/66, pulse 66, temperature 97.7 F (36.5 C), temperature source Oral, resp. rate 18, height 5' 10" (1.778 m), weight 83.6 kg (184 lb 4.9 oz), SpO2 98.00%. Physical Exam  Vitals reviewed.  Constitutional: He is oriented to person, place, and time. He appears well-developed.  HENT:  Right facial droop  Eyes:  Pupils round and reactive to light  Neck: Neck supple. No thyromegaly present.  Cardiovascular: Normal rate and regular rhythm.  Pulmonary/Chest: Breath sounds normal. No respiratory distress.  Abdominal: Bowel sounds are normal. He exhibits no distension. There is no tenderness.  Musculoskeletal: He exhibits no edema.  Neurological: He is alert and oriented to person, place, and time.  Patient makes eye contact with examiner. His speech is mildly dysarthric but fully intelligible. He follows three-step commands. Right pronator drift. RUE is 4-/5 RLE is 4/5. Right central 7 and tongue deviation noted. Minimal sensory findings noted on the right. Mild decrease in FMC upper greater than lower. Cognitively he displays normal insight and awareness. Skin: Skin is warm and dry.  Psychiatric: He has a normal mood and affect. His behavior is normal   Results for orders placed during the hospital encounter of 01/28/12 (from the past 48 hour(s))  GLUCOSE, CAPILLARY     Status: Abnormal   Collection Time   01/30/12  4:10 PM      Component Value Range Comment   Glucose-Capillary 100 (*) 70 - 99 mg/dL   GLUCOSE, CAPILLARY     Status: Abnormal   Collection Time     01/30/12  9:32 PM      Component Value Range Comment   Glucose-Capillary 173 (*) 70 - 99 mg/dL   GLUCOSE, CAPILLARY     Status: Abnormal   Collection Time   01/31/12  7:42 AM      Component Value Range Comment   Glucose-Capillary 114 (*) 70 - 99 mg/dL    BASIC METABOLIC PANEL     Status: Abnormal   Collection Time   01/31/12 10:25 AM      Component Value Range Comment   Sodium 134 (*) 135 - 145 mEq/L    Potassium 4.3  3.5 - 5.1 mEq/L    Chloride 98  96 - 112 mEq/L    CO2 24  19 - 32 mEq/L    Glucose, Bld 165 (*) 70 - 99 mg/dL    BUN 28 (*) 6 - 23 mg/dL    Creatinine, Ser 1.52 (*) 0.50 - 1.35 mg/dL    Calcium 10.2  8.4 - 10.5 mg/dL    GFR calc non Af Amer 41 (*) >90 mL/min    GFR calc Af Amer 48 (*) >90 mL/min   MAGNESIUM     Status: Normal   Collection Time   01/31/12 10:25 AM      Component Value Range Comment   Magnesium 2.2  1.5 - 2.5 mg/dL   TROPONIN I     Status: Normal   Collection Time   01/31/12 10:25 AM      Component Value Range Comment   Troponin I <0.30  <0.30 ng/mL   PHOSPHORUS     Status: Normal   Collection Time   01/31/12 10:25 AM      Component Value Range Comment   Phosphorus 2.7  2.3 - 4.6 mg/dL   GLUCOSE, CAPILLARY     Status: Abnormal   Collection Time   01/31/12 11:26 AM      Component Value Range Comment   Glucose-Capillary 146 (*) 70 - 99 mg/dL   GLUCOSE, CAPILLARY     Status: Abnormal   Collection Time   01/31/12  4:11 PM      Component Value Range Comment   Glucose-Capillary 137 (*) 70 - 99 mg/dL   GLUCOSE, CAPILLARY     Status: Abnormal   Collection Time   01/31/12  9:52 PM      Component Value Range Comment   Glucose-Capillary 183 (*) 70 - 99 mg/dL    Comment 1 Documented in Chart      Comment 2 Notify RN     GLUCOSE, CAPILLARY     Status: Abnormal   Collection Time   02/01/12  5:59 AM      Component Value Range Comment   Glucose-Capillary 133 (*) 70 - 99 mg/dL    Comment 1 Documented in Chart      Comment 2 Notify RN     GLUCOSE, CAPILLARY     Status: Abnormal   Collection Time   02/01/12 11:37 AM      Component Value Range Comment   Glucose-Capillary 161 (*) 70 - 99 mg/dL    No results found.  Post Admission Physician Evaluation: 1. Functional deficits secondary  to thrombotic left  lenticular nucleus --> Corona Radiata infarct. 2. Patient is admitted to receive collaborative, interdisciplinary care between the physiatrist, rehab nursing staff, and therapy team. 3. Patient's level of medical complexity and substantial therapy needs in context of that medical necessity cannot be provided at a lesser intensity of care such as a   SNF. 4. Patient has experienced substantial functional loss from his/her baseline which was documented above under the "Functional History" and "Functional Status" headings.  Judging by the patient's diagnosis, physical exam, and functional history, the patient has potential for functional progress which will result in measurable gains while on inpatient rehab.  These gains will be of substantial and practical use upon discharge  in facilitating mobility and self-care at the household level. 5. Physiatrist will provide 24 hour management of medical needs as well as oversight of the therapy plan/treatment and provide guidance as appropriate regarding the interaction of the two. 6. 24 hour rehab nursing will assist with bladder management, bowel management, safety, skin/wound care, disease management, medication administration, pain management and patient education  and help integrate therapy concepts, techniques,education, etc. 7. PT will assess and treat for:  fxnl mobility, NMR, balance, adaptive equipment, safety.  Goals are: mod I. 8. OT will assess and treat for: UES, NMR, fnxl mobility, ADL's, safety.   Goals are: mod I to supervision. 9. SLP will assess and treat for: speech intelligibility/communication.  Goals are: mod I. 10. Case Management and Social Worker will assess and treat for psychological issues and discharge planning. 11. Team conference will be held weekly to assess progress toward goals and to determine barriers to discharge. 12. Patient will receive at least 3 hours of therapy per day at least 5 days per week. 13. ELOS: 1-2 weeks       Prognosis:  excellent   Medical Problem List and Plan: 1. Thrombotic left lenticular nucleus-corona radiata infarct 2. DVT Prophylaxis/Anticoagulation: SCDs. Monitor for signs of DVT 3. Mood: Pt is generally up beat and motivated. Team to provide egosupport as appropriate 4. Neuropsych: This patient is capable of making decisions on his/her own behalf. 5. Hypertension. Lopressor 12.5 mg twice a day, Cozaar 50 mg daily. Monitor with increased mobility 6. Diabetes mellitus. Hemoglobin A1c 6.7. Amaryl 4 mg daily. Check blood sugars a.c. and at bedtime 7. Hyperlipidemia. Lovaza and Zocor 8. Chronic renal insufficiency. Baseline creatinine 1.6. Followup labs and encourage adequate fluid intake.     Zach Tykeria Wawrzyniak, MD 

## 2012-02-01 NOTE — Progress Notes (Signed)
Met with pt at bedside to discuss CIR. Pt would benefit from inpatient rehab and is in agreement with plan for possible admission. Have begun process for insurance approval and await notification from [facility]. Pt's CSW aware of above. Please call for questions: [phone].

## 2012-02-01 NOTE — Consult Note (Signed)
Physical Medicine and Rehabilitation Consult Reason for Consult: CVA Referring Physician: [facility]   HPI: Tyler [name] is a 78 y.o. right-handed male with history of hypertension, chronic renal insufficiency with baseline creatinine 1.7. Admitted 01/28/2012 with right sided weakness and slurred speech. MRI of the brain showed acute nonhemorrhagic infarct posterior left lenticular nucleus extending into the posterior aspect of the left corona radiata. MRA of the head with intracranial atherosclerotic type changes as well 1.4 mm aneurysm posterior lateral aspect left internal carotid artery. Echocardiogram with ejection fraction of 70% and grade 1 diastolic dysfunction. Carotid Dopplers with no ICA stenosis. Patient did not receive TPA. Neurology services consulted placed on Plavix therapy for stroke prophylaxis. Dr. Darrick Penna vascular surgery notified in regards to small aneurysm identified on MRA advised no surgical intervention or further workup at this time. Findings of mildly elevated hemoglobin A1c of 6.7 and placed on Amaryl with monitoring of blood sugars as well as diabetic diet. Physical therapy ongoing with occupational therapy evaluations pending with recommendations of physical medicine rehabilitation consult to consider inpatient rehabilitation services  Review of Systems  Gastrointestinal: Positive for constipation.  Musculoskeletal: Positive for myalgias.  Neurological: Positive for weakness.  All other systems reviewed and are negative.   Past Medical History  Diagnosis Date  . Hypertension   . Cancer    Past Surgical History  Procedure Date  . Prostate surgery   . Cardiac catheterization    History reviewed. No pertinent family history. Social History:  reports that he quit smoking about 43 years ago. He does not have any smokeless tobacco history on file. He reports that he does not drink alcohol or use illicit drugs. Allergies:  Allergies  Allergen Reactions  . Sulfa Drugs  Cross Reactors Rash   Medications Prior to Admission  Medication Sig Dispense Refill  . aspirin EC 81 MG tablet Take 81 mg by mouth daily.      . Cholecalciferol (VITAMIN D-3 PO) Take 1 tablet by mouth daily.      . Cyanocobalamin (VITAMIN B-12 PO) Take 1 tablet by mouth daily.      . fish oil-omega-3 fatty acids 1000 MG capsule Take 1 g by mouth daily.      . Flaxseed, Linseed, (FLAX SEEDS PO) Take 1 tablet by mouth daily.      [name] glimepiride (AMARYL) 4 MG tablet Take 4 mg by mouth daily before breakfast.      . losartan (COZAAR) 50 MG tablet Take 50 mg by mouth daily.      . pantoprazole (PROTONIX) 40 MG tablet Take 40 mg by mouth daily.      . pravastatin (PRAVACHOL) 10 MG tablet Take 10 mg by mouth daily.      . STUDY MEDICATION Inject 9 Units into the skin daily.        Home: Home Living Lives With: Spouse Available Help at Discharge: Family;Available PRN/intermittently (wife works during the day) Type of Home: House Home Access: Level entry Home Layout: One level (2 steps inside house, no rails) Bathroom Shower/Tub: Forensic scientist: Standard Bathroom Accessibility: Yes How Accessible: Accessible via walker Home Adaptive Equipment: Straight cane  Functional History: Prior Function Able to Take Stairs?: Yes Driving: Yes Vocation: Retired Comments: Agricultural consultant at [employer] Status:  Mobility: Bed Mobility Bed Mobility: Not assessed Supine to Sit: 3: Mod assist Sitting - Scoot to [place] of Bed: 3: Mod assist Transfers Transfers: Sit to Stand;Stand to [employer] Transfers Sit to Stand: 4: Min assist;With upper  extremity assist;From chair/3-in-1 Stand to Sit: 4: Min assist;With upper extremity assist;To chair/3-in-1 Stand Pivot Transfers: 3: Mod assist Ambulation/Gait Ambulation/Gait Assistance: 1: +2 Total assist Ambulation/Gait: Patient Percentage: 70% Ambulation Distance (Feet): 20 Feet Assistive device: Large base quad  cane Ambulation/Gait Assistance Details: Assist x 1 for stability while other assist with proper placement of RLE as well as assist to maintain stability of R knee to avoid hyperextension and buckling. RUE supported by therapist as well as stability to avoid right anterior lean during ambulation. Manual facilitation through hip for control of placement, and ankle to avoid eversion steppage.  Gait Pattern: Ataxic;Trunk flexed;Decreased stride length;Decreased hip/knee flexion - right;Decreased weight shift to right;Step-to pattern Gait velocity: decreased gait speed    ADL: ADL Upper Body Dressing: Performed;Minimal assistance Where Assessed - Upper Body Dressing: Supported sitting Lower Body Dressing: Performed;Moderate assistance Where Assessed - Lower Body Dressing: Supported sitting Toilet Transfer: Simulated;Moderate assistance Toilet Transfer Method: Squat pivot Toilet Transfer Equipment:  (bed to chair) Equipment Used: Gait belt Transfers/Ambulation Related to ADLs: mod assist with squat pivot from bed to chair.  Pt performed sit<>stand from chair with mod assist. ADL Comments: decreased balance sitting EOB for ADLs.  Cognition: Cognition Overall Cognitive Status: Appears within functional limits for tasks assessed Arousal/Alertness: Awake/alert Orientation Level: Oriented X4 Attention: Focused Focused Attention: Appears intact Memory: Appears intact Awareness: Appears intact Problem Solving: Appears intact Safety/Judgment: Appears intact Cognition Overall Cognitive Status: Appears within functional limits for tasks assessed/performed Arousal/Alertness: Awake/alert Orientation Level: Oriented X4 / Intact Behavior During Session: [facility] for tasks performed  Blood pressure 143/73, pulse 56, temperature 98.1 F (36.7 C), temperature source Oral, resp. rate 18, height 5\' 10"  (1.778 m), weight 83.6 kg (184 lb 4.9 oz), SpO2 96.00%. Physical Exam  Vitals reviewed. Constitutional:  He is oriented to person, place, and time. He appears well-developed.  HENT:       Right facial droop  Eyes:       Pupils round and reactive to light  Neck: Neck supple. No thyromegaly present.  Cardiovascular: Normal rate and regular rhythm.   Pulmonary/Chest: Breath sounds normal. No respiratory distress.  Abdominal: Bowel sounds are normal. He exhibits no distension. There is no tenderness.  Musculoskeletal: He exhibits no edema.  Neurological: He is alert and oriented to person, place, and time.       Patient makes eye contact with examiner. His speech is mildly dysarthric but fully intelligible. He follows three-step commands. Right pronator drift. RUE is 3-4/5 RLE is 4/5. Right central 7 and tongue deviation.  Skin: Skin is warm and dry.  Psychiatric: He has a normal mood and affect. His behavior is normal.    Results for orders placed during the hospital encounter of 01/28/12 (from the past 24 hour(s))  GLUCOSE, CAPILLARY     Status: Abnormal   Collection Time   01/31/12  7:42 AM      Component Value Range   Glucose-Capillary 114 (*) 70 - 99 mg/dL  BASIC METABOLIC PANEL     Status: Abnormal   Collection Time   01/31/12 10:25 AM      Component Value Range   Sodium 134 (*) 135 - 145 mEq/L   Potassium 4.3  3.5 - 5.1 mEq/L   Chloride 98  96 - 112 mEq/L   CO2 24  19 - 32 mEq/L   Glucose, Bld 165 (*) 70 - 99 mg/dL   BUN 28 (*) 6 - 23 mg/dL   Creatinine, Ser 2.13 (*) 0.50 -  1.35 mg/dL   Calcium 16.1  8.4 - 09.6 mg/dL   GFR calc non Af Amer 41 (*) >90 mL/min   GFR calc Af Amer 48 (*) >90 mL/min  MAGNESIUM     Status: Normal   Collection Time   01/31/12 10:25 AM      Component Value Range   Magnesium 2.2  1.5 - 2.5 mg/dL  TROPONIN I     Status: Normal   Collection Time   01/31/12 10:25 AM      Component Value Range   Troponin I <0.30  <0.30 ng/mL  PHOSPHORUS     Status: Normal   Collection Time   01/31/12 10:25 AM      Component Value Range   Phosphorus 2.7  2.3 - 4.6 mg/dL   GLUCOSE, CAPILLARY     Status: Abnormal   Collection Time   01/31/12 11:26 AM      Component Value Range   Glucose-Capillary 146 (*) 70 - 99 mg/dL  GLUCOSE, CAPILLARY     Status: Abnormal   Collection Time   01/31/12  4:11 PM      Component Value Range   Glucose-Capillary 137 (*) 70 - 99 mg/dL  GLUCOSE, CAPILLARY     Status: Abnormal   Collection Time   01/31/12  9:52 PM      Component Value Range   Glucose-Capillary 183 (*) 70 - 99 mg/dL   Comment 1 Documented in Chart     Comment 2 Notify RN     No results found.  Assessment/Plan: Diagnosis: thrombotic left lenticular nucleus --> Corona Radiata infarct 1. Does the need for close, 24 hr/day medical supervision in concert with the patient's rehab needs make it unreasonable for this patient to be served in a less intensive setting? Yes and Potentially 2. Co-Morbidities requiring supervision/potential complications: dm, prostate cancer 3. Due to bladder management, bowel management, safety, skin/wound care, disease management, medication administration, pain management and patient education, does the patient require 24 hr/day rehab nursing? Yes 4. Does the patient require coordinated care of a physician, rehab nurse, PT (1-2 hrs/day, 5 days/week), OT (1-2 hrs/day, 5 days/week) and SLP (1-2 hrs/day, 5 days/week) to address physical and functional deficits in the context of the above medical diagnosis(es)? Yes Addressing deficits in the following areas: balance, endurance, locomotion, strength, transferring, bowel/bladder control, bathing, dressing, feeding, grooming, toileting, cognition, language and psychosocial support 5. Can the patient actively participate in an intensive therapy program of at least 3 hrs of therapy per day at least 5 days per week? Yes 6. The potential for patient to make measurable gains while on inpatient rehab is excellent 7. Anticipated functional outcomes upon discharge from inpatient rehab are mod I with PT,  Mod  I to supervision with OT, mod I with SLP. 8. Estimated rehab length of stay to reach the above functional goals is: 7-10 days 9. Does the patient have adequate social supports to accommodate these discharge functional goals? Yes 10. Anticipated D/C setting: Home 11. Anticipated post D/C treatments: HH therapy 12. Overall Rehab/Functional Prognosis: good  RECOMMENDATIONS: This patient's condition is appropriate for continued rehabilitative care in the following setting: CIR Patient has agreed to participate in recommended program. Yes Note that insurance prior authorization may be required for reimbursement for recommended care.  Comment: A brief CIR stay would be appropriate pending bed availability. Rehab RN to follow up.   Ivory Broad, MD       02/01/2012

## 2012-02-01 NOTE — Discharge Summary (Signed)
[facility]                                                                                   Tyler [name], is a 78 y.o. male  DOB 1928/07/18  MRN [id number].  Admission date:  01/28/2012  Discharge Date:  02/02/2012  Primary MD  Michiel Sites, MD  Admitting Physician  Cristal Ford, MD  Admission Diagnosis  Cerebral embolism with cerebral infarction [434.11] Speech abnormality [784.59]  Discharge Diagnosis     Principal Problem:  *Cerebral embolism with cerebral infarction Active Problems:  Speech abnormality  DM2 (diabetes mellitus, type 2)  Hyperlipidemia  History of prostate cancer    Past Medical History  Diagnosis Date  . Hypertension   . Cancer     Past Surgical History  Procedure Date  . Prostate surgery   . Cardiac catheterization          Discharge Diagnoses:   Principal Problem:  *Cerebral embolism with cerebral infarction Active Problems:  Speech abnormality  DM2 (diabetes mellitus, type 2)  Hyperlipidemia  History of prostate cancer    Discharge Condition: Stable   Diet recommendation: See Discharge Instructions below   Consults Neurology   History of present illness and  Hospital Course:  See H&P, Labs, Consult and Test reports for all details in brief, patient was admitted for left MCA territory ischemic CVA causing right-sided hemiparesis with aphasia, patient underwent MRI MRA of the brain along with stable carotid venous Doppler along with stable 2-D echogram, he was seen by neurology, PT OT and speech therapy, he has been switched from aspirin to Plavix , his statin will be continued at home dose. Please monitor the patient's blood pressure, A1c, Accu-Cheks, lipid panel on a regular basis for the patient titration and outpatient risk factor modulation.   Diabetes metastatic to, A1c was 6.7 this admission, patient will be continued on his home dose Amaryl, of note patient self injects himself with research  medication which he has been provided by his outpatient physician, continue that medication per home regimen. Monitor CBGs q. a.c. at bedtime, can use sliding scale insulin per rehabilitation protocol if needed.  Chronic kidney disease stage IV at baseline patient's creatinine is between 1.5 and 1.6.   Chronic stage I diastolic dysfunction noted incidentally on echogram, no acute issues patient clinically compensated.    Patient had one episode of ventricular trigeminy lasting few beats, patient was completely asymptomatic, his EF is 60%, serial troponins were negative, EKG was nonacute, electrolytes were checked and stabilized, he was placed on low-dose beta blocker. No further workup indicated at this time. Stable on telemetry now.    Today   Subjective:   Tyler [name] today has no headache,no chest abdominal pain,no new weakness tingling or numbness, feels much better.  Objective:   Blood pressure 135/55, pulse 66, temperature 97.5 F (36.4 C), temperature source Oral, resp. rate 18, height 5\' 10"  (1.778 m), weight 83.6 kg (184 lb 4.9 oz), SpO2 100.00%.   Intake/Output Summary (Last 24 hours) at 02/02/12 0819 Last data filed at 02/01/12 1750  Gross per 24 hour  Intake    600 ml  Output  0 ml  Net    600 ml    Exam Awake Alert, Oriented *3, No new F.N deficits, Normal affect .AT,[name],[name], [name].  Symmetrical Chest wall movement, Good air movement bilaterally, CTAB RRR,No Gallops,Rubs or new Murmurs, No Parasternal Heave +ve B.Sounds, Abd Soft, Non tender, No organomegaly appriciated, No rebound -guarding or rigidity. No Cyanosis, Clubbing or edema, No new Rash or bruise  Data Review   Major procedures and Radiology Reports - PLEASE review detailed and final reports for all details in brief -    Echo  Left ventricle: The cavity size was normal. Wall thickness was increased in a pattern of mild LVH. Systolic  function was vigorous. The estimated ejection fraction was in the range of 65% to 70%. Doppler parameters are consistent with abnormal left ventricular relaxation (grade 1 diastolic dysfunction).    Dg Chest 2 View  01/29/2012  *RADIOLOGY REPORT*  Clinical Data: Generalized weakness.  CHEST - 2 VIEW  Comparison: Two-view chest x-ray 07/08/2010, 03/24/2010, 11/28/2009.  Findings: Cardiac silhouette upper normal in size for the AP portable technique, unchanged.  Cardiac silhouette normal in size for the AP technique, unchanged.  Thoracic aorta mildly atherosclerotic, unchanged.  Hilar and mediastinal contours otherwise unremarkable.  Suboptimal inspiration accounts for mild atelectasis in the lower lobes.  Lungs otherwise clear.  No pleural effusions.  Degenerative changes involving the thoracic spine.  IMPRESSION: Suboptimal inspiration accounts for mild atelectasis in the lower lobes.  No acute cardiopulmonary disease otherwise.   Original Report Authenticated By: Arnell Sieving, M.D.    Ct Head (brain) Wo Contrast  01/28/2012  *RADIOLOGY REPORT*  Clinical Data: 78 year old male with right side weakness and facial droop.  Code stroke.  CT HEAD WITHOUT CONTRAST  Technique:  Contiguous axial images were obtained from the base of the skull through the vertex without contrast.  Comparison: Brain MRI 08/12/2006.  Findings: Visualized paranasal sinuses and mastoids are clear.  No acute orbit or scalp soft tissue findings. No acute osseous abnormality identified.  Chronic sulcal variation at the left superior frontal gyrus is stable.  Stable cerebral volume.  No ventriculomegaly. No evidence of cortically based acute infarction identified.  No acute intracranial hemorrhage identified.  No suspicious intracranial vascular hyperdensity.  Largely normal for age gray-white matter differentiation throughout the brain.  IMPRESSION: No acute cortically based infarct or acute intracranial abnormality identified.   Critical Value/emergent results were called by telephone at the time of interpretation on 01/28/2012 at 1847 hours to Dr. Aubery Lapping, who verbally acknowledged these results.   Original Report Authenticated By: Harley Hallmark, M.D.    Mr Brain Wo Contrast  01/29/2012  *RADIOLOGY REPORT*  Clinical Data:  Diabetic hypertensive patient with hyperlipidemia and chronic renal failure presenting with right upper extremity weakness and right facial droop and slurred speech.  MRI BRAIN WITHOUT CONTRAST MRA HEAD WITHOUT CONTRAST  Technique: Multiplanar, multiecho pulse sequences of the brain and surrounding structures were obtained according to standard protocol without intravenous contrast.  Angiographic images of the head were obtained using MRA technique without contrast.  Comparison: 01/27/2014 head CT.  08/12/2006 brain MR.  MRI HEAD  Findings:  Acute non hemorrhagic infarct posterior left lenticular nucleus extending into the posterior aspect of the left corona radiata.  Small amount of blood breakdown products posterior right lenticular nucleus consistent with remote infarct or hemorrhage.  Otherwise no evidence of intracranial hemorrhage.  Moderate small vessel disease type changes.  Global atrophy. Ventricular prominence may  be related to atrophy although difficult to completely exclude a mild component hydrocephalus.  The appearance without significant change.  No intracranial mass lesion detected on this unenhanced exam.  Opacification petrous apex unchanged.  Paranasal sinus mucosal thickening.  Partially empty sella.  IMPRESSION: Acute non hemorrhagic infarct posterior left lenticular nucleus extending into the posterior aspect of the left corona radiata.  Please see above.  This has been made a PRA call report utilizing dashboard call feature.  MRA HEAD  Findings: Anterior circulation without medium or large size vessel significant stenosis or occlusion.  1.4 mm aneurysm posterior lateral aspect left internal  carotid artery and proximal cavernous segment.  No significant stenosis distal vertebral arteries. Nonvisualization right PICA and both AICAs.  Slight irregularity basilar artery without high-grade stenosis.  Moderate narrowing proximal right posterior cerebral artery. Posterior cerebral artery branch vessel irregularity bilaterally. Mild narrowing proximal left posterior cerebral artery.  Mild irregularity superior cerebellar artery bilaterally.  IMPRESSION: Intracranial atherosclerotic type changes anomaly involving branch vessels as detailed above.  1.4 mm aneurysm posterior lateral aspect left internal carotid artery and proximal cavernous segment.   Original Report Authenticated By: Fuller Canada, M.D.    Mr Maxine Glenn Head/brain Wo Cm  01/29/2012  *RADIOLOGY REPORT*  Clinical Data:  Diabetic hypertensive patient with hyperlipidemia and chronic renal failure presenting with right upper extremity weakness and right facial droop and slurred speech.  MRI BRAIN WITHOUT CONTRAST MRA HEAD WITHOUT CONTRAST  Technique: Multiplanar, multiecho pulse sequences of the brain and surrounding structures were obtained according to standard protocol without intravenous contrast.  Angiographic images of the head were obtained using MRA technique without contrast.  Comparison: 01/27/2014 head CT.  08/12/2006 brain MR.  MRI HEAD  Findings:  Acute non hemorrhagic infarct posterior left lenticular nucleus extending into the posterior aspect of the left corona radiata.  Small amount of blood breakdown products posterior right lenticular nucleus consistent with remote infarct or hemorrhage.  Otherwise no evidence of intracranial hemorrhage.  Moderate small vessel disease type changes.  Global atrophy. Ventricular prominence may be related to atrophy although difficult to completely exclude a mild component hydrocephalus.  The appearance without significant change.  No intracranial mass lesion detected on this unenhanced exam.   Opacification petrous apex unchanged.  Paranasal sinus mucosal thickening.  Partially empty sella.  IMPRESSION: Acute non hemorrhagic infarct posterior left lenticular nucleus extending into the posterior aspect of the left corona radiata.  Please see above.  This has been made a PRA call report utilizing dashboard call feature.  MRA HEAD  Findings: Anterior circulation without medium or large size vessel significant stenosis or occlusion.  1.4 mm aneurysm posterior lateral aspect left internal carotid artery and proximal cavernous segment.  No significant stenosis distal vertebral arteries. Nonvisualization right PICA and both AICAs.  Slight irregularity basilar artery without high-grade stenosis.  Moderate narrowing proximal right posterior cerebral artery. Posterior cerebral artery branch vessel irregularity bilaterally. Mild narrowing proximal left posterior cerebral artery.  Mild irregularity superior cerebellar artery bilaterally.  IMPRESSION: Intracranial atherosclerotic type changes anomaly involving branch vessels as detailed above.  1.4 mm aneurysm posterior lateral aspect left internal carotid artery and proximal cavernous segment.   Original Report Authenticated By: Fuller Canada, M.D.     Micro Results   Lab Results  Component Value Date   HGBA1C 6.7* 01/29/2012    Lab Results  Component Value Date   CHOL 138 01/29/2012   HDL 31* 01/29/2012   LDLCALC 82 01/29/2012   TRIG  125 01/29/2012   CHOLHDL 4.5 01/29/2012     CBC w Diff: Lab Results  Component Value Date   WBC 8.4 01/29/2012   HGB 13.7 01/29/2012   HCT 39.5 01/29/2012   PLT 173 01/29/2012   LYMPHOPCT 39 01/28/2012   MONOPCT 6 01/28/2012   EOSPCT 5 01/28/2012   BASOPCT 1 01/28/2012    CMP: Lab Results  Component Value Date   NA 134* 01/31/2012   K 4.3 01/31/2012   CL 98 01/31/2012   CO2 24 01/31/2012   BUN 28* 01/31/2012   CREATININE 1.52* 01/31/2012   PROT 6.9 01/29/2012   ALBUMIN 3.8 01/29/2012   BILITOT 0.7 01/29/2012    ALKPHOS 113 01/29/2012   AST 15 01/29/2012   ALT 15 01/29/2012  .   Discharge Instructions     Follow with Primary MD Michiel Sites, MD in 7 days   Get CBC, CMP, checked 7 days by Primary MD and again as instructed by your Primary MD.    Get Medicines reviewed and adjusted.  Please request your Prim.MD to go over all Hospital Tests and Procedure/Radiological results at the follow up, please get all Hospital records sent to your Prim MD by signing hospital release before you go home.  Activity: As tolerated with Full fall precautions use walker/cane & assistance as needed   Diet:  Heart Healthy and Low Carb  For Heart failure patients - Check your Weight same time everyday, if you gain over 2 pounds, or you develop in leg swelling, experience more shortness of breath or chest pain, call your Primary MD immediately. Follow Cardiac Low Salt Diet and 1.8 lit/day fluid restriction.  Disposition Rehab  If you experience worsening of your admission symptoms, develop shortness of breath, life threatening emergency, suicidal or homicidal thoughts you must seek medical attention immediately by calling 911 or calling your MD immediately  if symptoms less severe.  You Must read complete instructions/literature along with all the possible adverse reactions/side effects for all the Medicines you take and that have been prescribed to you. Take any new Medicines after you have completely understood and accpet all the possible adverse reactions/side effects.   Do not drive and provide baby sitting services if your were admitted for syncope or siezures until you have seen by Primary MD or a Neurologist and advised to do so again.  Do not drive when taking Pain medications.    Do not take more than prescribed Pain, Sleep and Anxiety Medications  Special Instructions: If you have smoked or chewed Tobacco  in the last 2 yrs please stop smoking, stop any regular Alcohol  and or any Recreational drug  use.  Wear Seat belts while driving.  Follow-up Information    Follow up with Michiel Sites, MD. Schedule an appointment as soon as possible for a visit in 1 week.   Contact information:   [address] [place] [address] [phone]       Follow up with Gates Rigg, MD. Schedule an appointment as soon as possible for a visit in 2 weeks.   Contact information:   [address] [facility] [place] [address] [phone]            Discharge Medications     Medication List     As of 02/02/2012  8:19 AM    START taking these medications         clopidogrel 75 MG tablet   Commonly known as: PLAVIX  Take 1 tablet (75 mg total) by mouth daily with breakfast.      metoprolol tartrate 12.5 mg Tabs   Commonly known as: LOPRESSOR   Take 0.5 tablets (12.5 mg total) by mouth 2 (two) times daily.      CHANGE how you take these medications         losartan 25 MG tablet   Commonly known as: COZAAR   Take 2 tablets (50 mg total) by mouth daily.   What changed: medication strength      CONTINUE taking these medications         fish oil-omega-3 fatty acids 1000 MG capsule      FLAX SEEDS PO      glimepiride 4 MG tablet   Commonly known as: AMARYL      pantoprazole 40 MG tablet   Commonly known as: PROTONIX      pravastatin 10 MG tablet   Commonly known as: PRAVACHOL      STUDY MEDICATION      VITAMIN B-12 PO      VITAMIN D-3 PO      STOP taking these medications         aspirin EC 81 MG tablet          Where to get your medications       Information on where to get these meds is not yet available. Ask your nurse or doctor.         clopidogrel 75 MG tablet   losartan 25 MG tablet   metoprolol tartrate 12.5 mg Tabs               Total Time in preparing paper work, data evaluation and todays exam - 35 minutes  Leroy Sea M.D on 02/02/2012 at 8:19 AM  [facility] Office  [phone]

## 2012-02-01 NOTE — Progress Notes (Signed)
Occupational Therapy Treatment Patient Details Name: Tyler [name] MRN: [id number] DOB: 10/20/1928 Today's Date: 02/01/2012 Time: 1335-1400 OT Time Calculation (min): 25 min  OT Assessment / Plan / Recommendation    Follow Up Recommendations  Inpatient Rehab       Equipment Recommendations  3 in 1 bedside comode          Plan Discharge plan remains appropriate    Precautions / Restrictions Precautions Precautions: Fall       ADL  Toilet Transfer: Simulated;Other (comment);Moderate assistance (performed sit to stand  x 4) Toilet Transfer Method: Sit to stand;Other (comment) (very cues and hands on assist for controlled descent) Toileting - Clothing Manipulation and Hygiene: Simulated;Moderate assistance Where Assessed - Toileting Clothing Manipulation and Hygiene: Standing Transfers/Ambulation Related to ADLs: Pt participated in functional sit to stand in prep for increased I with ADL activity. Pt tended to plop in chairs and nothave L hand placed on arm rest for stand to sit transition.      OT Goals ADL Goals ADL Goal: Grooming - Progress: Progressing toward goals ADL Goal: Toilet Transfer - Progress: Progressing toward goals Miscellaneous OT Goals OT Goal: Miscellaneous Goal #1 - Progress: Progressing toward goals  Visit Information  Last OT Received On: 02/01/12          Cognition  Overall Cognitive Status: Appears within functional limits for tasks assessed/performed Arousal/Alertness: Awake/alert Orientation Level: Oriented X4 / Intact Behavior During Session: [facility] for tasks performed       Exercises  General Exercises - Upper Extremity Shoulder Extension: AAROM;Right;10 reps;Limitations;Other (comment);AROM Shoulder Extension Limitations: . Pt with decreased strength and coordination with RUE during functional reach and ROM actiivity. Educated on importance of using vision to compensate for decreased propioception of RUE      End of Session OT - End of  Session Activity Tolerance: Patient tolerated treatment well Patient left: in chair;with family/visitor present  GO     [name], [place] Kung 02/01/2012, 2:07 PM

## 2012-02-01 NOTE — PMR Pre-admission (Signed)
PMR Admission Coordinator Pre-Admission Assessment  Patient: Tyler [name] is an 78 y.o., male MRN: [id number] DOB: 1929-01-06 Height: 5\' 10"  (177.8 cm) Weight: 83.6 kg (184 lb 4.9 oz)  Insurance Information HMO: yes     PRIMARY: A[employer] Medicare    Policy#: [id number]      Subscriber: self CM Name:Zelda      Phone#: [phone]    Fax#:  Pre-Cert#: [id number]     Employer:Retired  Benefits:  Phone #: [phone]     Name: [place] G. Eff. Date: 05/05/11     Deduct:0   Out of Pocket Max: $3900      Life Max: none CIR: ($220/1-7) ($0/8-99)      SNF: ($50/1-20) ($150/21-40) ($0/41-100) Outpatient:  No limit    Co-Pay: $40 Home Health: $0      Co-Pay: 0 DME: 80/20%     Providers: in network  Emergency Contact Information Contact Information    Name Relation Home Work Mobile   [place] Spouse [phone]  [phone]     Current Medical History  Patient Admitting Diagnosis: Thrombotic left lenticular nucleus --> Corona Radiata infarct  History of Present Illness: 78 y.o. right-handed male with history of hypertension, chronic renal insufficiency with baseline creatinine 1.7. Admitted 01/28/2012 with right sided weakness and slurred speech. MRI of the brain showed acute nonhemorrhagic infarct posterior left lenticular nucleus extending into the posterior aspect of the left corona radiata. MRA of the head with intracranial atherosclerotic type changes as well 1.4 mm aneurysm posterior lateral aspect left internal carotid artery. Echocardiogram with ejection fraction of 70% and grade 1 diastolic dysfunction. Carotid Dopplers with no ICA stenosis. Patient did not receive TPA. Neurology services consulted placed on Plavix therapy for stroke prophylaxis.  Dr. Darrick Penna vascular surgery notified in regards to small aneurysm identified on MRA advised no surgical intervention or further workup at this time. Findings of mildly elevated hemoglobin A1c of 6.7 and placed on Amaryl with monitoring of blood  sugars as well as diabetic diet.  Total: 3 =NIH   Past Medical History  Past Medical History  Diagnosis Date  . Hypertension   . Cancer    Family History  family history is not on file.  Prior Rehab/Hospitalizations: none  Current Medications  Current facility-administered medications:clopidogrel (PLAVIX) tablet 75 mg, 75 mg, Oral, Q breakfast, Micki Riley, MD, 75 mg at 02/02/12 0827;  glimepiride (AMARYL) tablet 4 mg, 4 mg, Oral, QAC breakfast, Eduard Clos, MD, 4 mg at 02/02/12 0829;  insulin aspart (novoLOG) injection 0-9 Units, 0-9 Units, Subcutaneous, TID WC, Eduard Clos, MD, 1 Units at 02/02/12 2595 magnesium hydroxide (MILK OF MAGNESIA) suspension 30 mL, 30 mL, Oral, Daily PRN, Leroy Sea, MD, 30 mL at 01/31/12 0827;  metoprolol tartrate (LOPRESSOR) tablet 12.5 mg, 12.5 mg, Oral, BID, Leroy Sea, MD, 12.5 mg at 02/01/12 2222;  omega-3 acid ethyl esters (LOVAZA) capsule 1 g, 1 g, Oral, Daily, Eduard Clos, MD, 1 g at 02/02/12 0933 pantoprazole (PROTONIX) EC tablet 40 mg, 40 mg, Oral, Q1200, Eduard Clos, MD, 40 mg at 02/01/12 1119;  senna-docusate (Senokot-S) tablet 1 tablet, 1 tablet, Oral, QHS PRN, Eduard Clos, MD;  simvastatin (ZOCOR) tablet 5 mg, 5 mg, Oral, q1800, Eduard Clos, MD, 5 mg at 02/01/12 1718  Patients Current Diet: Carb Control  Precautions / Restrictions Precautions Precautions: Fall Precaution Comments: ' Restrictions Weight Bearing Restrictions: No   Prior Activity Level Community (5-7x/wk): Active daily Home Assistive Devices / Equipment  Home Assistive Devices/Equipment: None Home Adaptive Equipment: Straight cane  Prior Functional Level Prior Function Level of Independence: Independent Able to Take Stairs?: Yes Driving: Yes Vocation: Retired Comments: Agricultural consultant at [employer]  Current Functional Level Cognition  Arousal/Alertness: Awake/alert Overall Cognitive Status: Appears within  functional limits for tasks assessed Overall Cognitive Status: Appears within functional limits for tasks assessed/performed Orientation Level: Oriented to person;Oriented to place;Oriented to time Attention: Focused Focused Attention: Appears intact Memory: Appears intact Awareness: Appears intact Problem Solving: Appears intact Safety/Judgment: Appears intact    Extremity Assessment (includes Sensation/Coordination)  RUE ROM/Strength/Tone: Deficits RUE ROM/Strength/Tone Deficits: 3/5 digit flexion/extension.  3+/5 throughout wrist, elbow, and shoulder. RUE Sensation: Deficits RUE Sensation Deficits: tingling RUE Coordination: Deficits RUE Coordination Deficits: ataxic  RLE ROM/Strength/Tone: Deficits RLE ROM/Strength/Tone Deficits: strength grossly 4/5 although unable to maintain control functionally RLE Sensation: Deficits RLE Sensation Deficits: tingling although less than RUE RLE Coordination: Deficits RLE Coordination Deficits: ataxic movements during gait    ADLs  Upper Body Dressing: Performed;Minimal assistance Where Assessed - Upper Body Dressing: Supported sitting Lower Body Dressing: Performed;Moderate assistance Where Assessed - Lower Body Dressing: Supported sitting Toilet Transfer: Simulated;Other (comment);Moderate assistance (performed sit to stand  x 4) Toilet Transfer Method: Sit to stand;Other (comment) (very cues and hands on assist for controlled descent) Toilet Transfer Equipment:  (bed to chair) Toileting - Clothing Manipulation and Hygiene: Simulated;Moderate assistance Where Assessed - Toileting Clothing Manipulation and Hygiene: Standing Equipment Used: Gait belt Transfers/Ambulation Related to ADLs: Pt participated in functional sit to stand in prep for increased I with ADL activity. Pt tended to plop in chairs and nothave L hand placed on arm rest for stand to sit transition. ADL Comments: decreased balance sitting EOB for ADLs.    Mobility  Bed  Mobility: Not assessed Supine to Sit: 3: Mod assist Sitting - Scoot to Edge of Bed: 3: Mod assist    Transfers  Transfers: Sit to Stand;Stand to Sit;Stand Pivot Transfers Sit to Stand: 4: Min assist;With upper extremity assist;From chair/3-in-1 Stand to Sit: 4: Min assist;With upper extremity assist;To chair/3-in-1 Stand Pivot Transfers: 3: Mod assist    Ambulation / Gait / Stairs / Psychologist, prison and probation services  Ambulation/Gait Ambulation/Gait Assistance: 1: +2 Total assist Ambulation/Gait: Patient Percentage: 70% Ambulation Distance (Feet): 20 Feet Assistive device: Large base quad cane Ambulation/Gait Assistance Details: Assist x 1 for stability while other assist with proper placement of RLE as well as assist to maintain stability of R knee to avoid hyperextension and buckling. RUE supported by therapist as well as stability to avoid right anterior lean during ambulation. Manual facilitation through hip for control of placement, and ankle to avoid eversion steppage.  Gait Pattern: Ataxic;Trunk flexed;Decreased stride length;Decreased hip/knee flexion - right;Decreased weight shift to right;Step-to pattern Gait velocity: decreased gait speed    Posture / Balance Static Sitting Balance Static Sitting - Balance Support: Feet supported Static Sitting - Level of Assistance: 4: Min assist Static Sitting - Comment/# of Minutes: Pt sat EOB with min assist due to posterior lean.  Pt sometimes able to correct with VC but often required tactile cueing to obtain midline orientation. Static Standing Balance Static Standing - Balance Support: Left upper extremity supported Static Standing - Level of Assistance: 4: Min assist;3: Mod assist Static Standing - Comment/# of Minutes: Encouraged pt to maintain balance through L side with L quad cane in standing. Pt able to maintain balance independenlty for ~5 seconds without any assist. Assist to avoid anterior right lean.  Previous Home Environment Living  Arrangements: Spouse/significant other Lives With: Spouse Available Help at Discharge: Family;Available PRN/intermittently (wife works during the day) Type of Home: House Home Layout: One level (2 steps inside house, no rails) Home Access: Level entry Bathroom Shower/Tub: Tub/shower unit;Curtain Firefighter: Standard Bathroom Accessibility: Yes How Accessible: Accessible via walker Home Care Services: No  Discharge Living Setting Plans for Discharge Living Setting: Patient's home;House;Lives with (comment) (wife) Type of Home at Discharge: House Discharge Home Layout: One level Discharge Home Access: Other (comment) (2 steps in home) Entrance Stairs-Number of Steps: 0 Do you have any problems obtaining your medications?: No  Social/Family/Support Systems Patient Roles: Spouse;Parent Contact Information: [phone] Anticipated Caregiver: Tricia [place] Anticipated Caregiver's Contact Information: [phone], cell:[phone] Ability/Limitations of Caregiver: none Caregiver Availability: Intermittent (Works days). Daughter-in-law and others can help provide 24/7 supervision if needed. Discharge Plan Discussed with Primary Caregiver: Yes Is Caregiver In Agreement with Plan?: Yes Does Caregiver/Family have Issues with Lodging/Transportation while Pt is in Rehab?: No  Goals/Additional Needs Patient/Family Goal for Rehab: PT & OT: Mod I Expected length of stay: 7-10 days Cultural Considerations: none Dietary Needs: Carb Mod Equipment Needs: TBD Pt/Family Agrees to Admission and willing to participate: Yes Program Orientation Provided & Reviewed with Pt/Caregiver Including Roles  & Responsibilities: Yes  Patient Condition: This patient's condition remains as documented in the Consult dated 02/01/12, in which the Rehabilitation Physician determined and documented that the patient's condition is appropriate for intensive rehabilitative care in an inpatient rehabilitation  facility.  Preadmission Screen Completed By:  Meryl Dare, 02/02/2012 11:19 AM ______________________________________________________________________   Discussed status with Dr.Swartz  On 02/02/12 at 11;30 AM and received telephone approval for admission today.  Admission Coordinator:  Meryl Dare, time 11:30 AM/Date10/1/13

## 2012-02-01 NOTE — Progress Notes (Signed)
Met with pt and his wife, son and daughter-in-law to discuss CIR and informed them that we await insurance authorization for CIR admission. Have followed-up with [facility] and faxed additional clinicals. Will f/u in AM. Please call with questions: [phone].

## 2012-02-01 NOTE — Progress Notes (Signed)
[facility]                                                                                Patient Demographics  Tyler [name], is a 78 y.o. male  ZOX:[id number]  WJX:[id number]  DOB - December 25, 1928  Admit date - 01/28/2012  Admitting Physician Cristal Ford, MD  Outpatient Primary MD for the patient is Michiel Sites, MD  LOS - 4   Chief Complaint  Patient presents with  . Code Stroke        Assessment & Plan    #1. L.MCA Ischemic CVA - continue neurochecks, stable swallow, will need speech assistance, noted MRI/MRA brain, stable carotid Doppler and 2-D echo. Monitor shows sinus rhythm. Plavix continue, Neuro following. PT-OT and Speech input. Noted small aneurysm on MRA discussed with Dr. Darrick Penna vascular surgeon no further followup or recommendations at this time.    #2. Diabetes mellitus type 2 - continue Amaryl and will place on sliding-scale coverage. Patient is on a research medication for diabetes which will be on hold.   Lab Results  Component Value Date   HGBA1C 6.7* 01/29/2012    CBG (last 3)   Basename 02/01/12 0559 01/31/12 2152 01/31/12 1611  GLUCAP 133* 183* 137*      #3. Hypertension - permissive HTN due to CVA, resume,placed on low-dose beta blocker monitor blood pressures.    #4. Chronic kidney disease stage 4, Craet at baseline around 1.6, ARB resume in 48hrs.    #5. Hyperlipidemia - continue home medications. LDL<100   Lab Results  Component Value Date   CHOL 138 01/29/2012   HDL 31* 01/29/2012   LDLCALC 82 01/29/2012   TRIG 125 01/29/2012   CHOLHDL 4.5 01/29/2012     #6. History of prostate cancer status post surgery.    #7. Ventricular trigeminy on telemetry, patient asymptomatic, EF is about 60%, will check electrolytes including magnesium, patient already on beta blocker, repeat baseline EKG ,  negative 3 sets of troponin.    #8. Chronic diastolic CHF. No acute issues this admission.   Echo  Left  ventricle: The cavity size was normal. Wall thickness was increased in a pattern of mild LVH. Systolic function was vigorous. The estimated ejection fraction was in the range of 65% to 70%. Doppler parameters are consistent with abnormal left ventricular relaxation (grade 1 diastolic dysfunction).     Code Status: Full  Family Communication: D/W patient and wife  Disposition Plan:  Rehabilitation versus CIR     Procedures   MRI-A,Echo,Carotid US   Consults  Neuro,PT,OT,Speech   Time Spent in minutes   35   Antibiotics    Anti-infectives    None      Scheduled Meds:    . clopidogrel  75 mg Oral Q breakfast  . glimepiride  4 mg Oral QAC breakfast  . insulin aspart  0-9 Units Subcutaneous TID WC  . metoprolol tartrate  12.5 mg Oral BID  . omega-3 acid ethyl esters  1 g Oral Daily  . pantoprazole  40 mg Oral Q1200  . simvastatin  5 mg Oral q1800   Continuous Infusions:  PRN Meds:.magnesium hydroxide, senna-docusate   DVT Prophylaxis  SCDs    Leroy Sea M.D on 02/01/2012 at 10:37 AM  Between 7am to 7pm - Pager - [phone]  After 7pm go to [email or web address] - password [email or web address]  And look for the night coverage person covering for me after hours  [facility] Office  [phone]    Subjective:   [name] today has, No headache, No chest pain, No abdominal pain - No Nausea, No new weakness tingling or numbness, No Cough - SOB.    Objective:   Filed Vitals:   01/31/12 2153 02/01/12 0144 02/01/12 0541 02/01/12 1003  BP: 152/79 150/67 143/73 139/69  Pulse: 70 58 56 73  Temp: 97.4 F (36.3 C) 98 F (36.7 C) 98.1 F (36.7 C) 98.7 F (37.1 C)  TempSrc: Oral Oral Oral Oral  Resp: 18 18 18 18   Height:      Weight:      SpO2: 99% 99% 96% 94%    Wt Readings from Last 3 Encounters:  01/31/12 83.6 kg (184 lb 4.9 oz)     Intake/Output Summary (Last 24 hours) at 02/01/12 1037 Last data filed at 02/01/12 0733  Gross per 24 hour  Intake     300 ml  Output    600 ml  Net   -300 ml    Exam Awake Alert, Oriented X 3, No new F.N deficits, Rt facial droop, Rt Arm strength 4/5, Normal affect [place].AT,[name],[name], [name].  Symmetrical Chest wall movement, Good air movement bilaterally, CTAB RRR,No Gallops,Rubs or new Murmurs, No Parasternal Heave +ve B.Sounds, Abd Soft, Non tender, No organomegaly appriciated, No rebound - guarding or rigidity. No Cyanosis, Clubbing or edema, No new Rash or bruise      Data Review   Micro Results No results found for this or any previous visit (from the past 240 hour(s)).  Radiology Reports Dg Chest 2 View  01/29/2012  *RADIOLOGY REPORT*  Clinical Data: Generalized weakness.  CHEST - 2 VIEW  Comparison: Two-view chest x-ray 07/08/2010, 03/24/2010, 11/28/2009.  Findings: Cardiac silhouette upper normal in size for the AP portable technique, unchanged.  Cardiac silhouette normal in size for the AP technique, unchanged.  Thoracic aorta mildly atherosclerotic, unchanged.  Hilar and mediastinal contours otherwise unremarkable.  Suboptimal inspiration accounts for mild atelectasis in the lower lobes.  Lungs otherwise clear.  No pleural effusions.  Degenerative changes involving the thoracic spine.  IMPRESSION: Suboptimal inspiration accounts for mild atelectasis in the lower lobes.  No acute cardiopulmonary disease otherwise.   Original Report Authenticated By: Arnell Sieving, M.D.    Ct Head (brain) Wo Contrast  01/28/2012  *RADIOLOGY REPORT*  Clinical Data: 78 year old male with right side weakness and facial droop.  Code stroke.  CT HEAD WITHOUT CONTRAST  Technique:  Contiguous axial images were obtained from the base of the skull through the vertex without contrast.  Comparison: Brain MRI 08/12/2006.  Findings: Visualized paranasal sinuses and mastoids are clear.  No acute orbit or scalp soft tissue findings. No acute osseous abnormality identified.   Chronic sulcal variation at the left superior frontal gyrus is stable.  Stable cerebral volume.  No ventriculomegaly. No evidence of cortically based acute infarction identified.  No acute intracranial hemorrhage identified.  No suspicious intracranial vascular hyperdensity.  Largely normal for age gray-white matter differentiation throughout the brain.  IMPRESSION: No acute cortically based infarct or acute intracranial abnormality identified.  Critical Value/emergent results were called by telephone at the time of interpretation on 01/28/2012 at 1847  hours to Dr. Aubery Lapping, who verbally acknowledged these results.   Original Report Authenticated By: Harley Hallmark, M.D.     CBC  Lab 01/29/12 0705 01/28/12 1841 01/28/12 1830  WBC 8.4 -- 8.2  HGB 13.7 13.6 13.4  HCT 39.5 40.0 39.0  PLT 173 -- 154  MCV 89.8 -- 91.3  MCH 31.1 -- 31.4  MCHC 34.7 -- 34.4  RDW 12.8 -- 13.2  LYMPHSABS -- -- 3.2  MONOABS -- -- 0.5  EOSABS -- -- 0.4  BASOSABS -- -- 0.0  BANDABS -- -- --    Chemistries   Lab 01/31/12 1025 01/30/12 0623 01/29/12 1626 01/29/12 0705 01/28/12 1841 01/28/12 1830  NA 134* 135 137 138 140 --  K 4.3 4.5 4.1 4.0 4.1 --  CL 98 102 102 104 106 --  CO2 24 23 22 24  -- 22  GLUCOSE 165* 140* 138* 107* 223* --  BUN 28* 22 22 22  28* --  CREATININE 1.52* 1.42* 1.41* 1.49* 1.70* --  CALCIUM 10.2 9.8 9.8 9.8 -- 9.5  MG 2.2 2.3 2.0 -- -- --  AST -- -- -- 15 -- 16  ALT -- -- -- 15 -- 16  ALKPHOS -- -- -- 113 -- 117  BILITOT -- -- -- 0.7 -- 0.3   ------------------------------------------------------------------------------------------------------------------ estimated creatinine clearance is 39.4 ml/min (by C-G formula based on Cr of 1.52). ------------------------------------------------------------------------------------------------------------------ No results found for this basename: HGBA1C:2 in the last 72  hours ------------------------------------------------------------------------------------------------------------------ No results found for this basename: CHOL:2,HDL:2,LDLCALC:2,TRIG:2,CHOLHDL:2,LDLDIRECT:2 in the last 72 hours ------------------------------------------------------------------------------------------------------------------ No results found for this basename: TSH,T4TOTAL,FREET3,T3FREE,THYROIDAB in the last 72 hours ------------------------------------------------------------------------------------------------------------------ No results found for this basename: VITAMINB12:2,FOLATE:2,FERRITIN:2,TIBC:2,IRON:2,RETICCTPCT:2 in the last 72 hours  Coagulation profile  Lab 01/28/12 1830  INR 1.00  PROTIME --    No results found for this basename: DDIMER:2 in the last 72 hours  Cardiac Enzymes  Lab 01/31/12 1025 01/30/12 0623 01/29/12 2134  CKMB -- -- --  TROPONINI <0.30 <0.30 <0.30  MYOGLOBIN -- -- --   ------------------------------------------------------------------------------------------------------------------ No components found with this basename: POCBNP:3

## 2012-02-01 NOTE — Clinical Social Work Psychosocial (Signed)
     Clinical Social Work Department BRIEF PSYCHOSOCIAL ASSESSMENT 02/01/2012  Patient:  Tyler [name],Tyler [name]     Account Number:  [id number]     Admit date:  01/28/2012  Clinical Social Worker:  Peggyann Shoals  Date/Time:  02/01/2012 05:32 PM  Referred by:  Physician  Date Referred:  02/01/2012 Referred for  SNF Placement   Other Referral:   Interview type:  Family Other interview type:    PSYCHOSOCIAL DATA Living Status:  WIFE Admitted from facility:   Level of care:   Primary support name:  Tyler [name] Primary support relationship to patient:  SPOUSE Degree of support available:   supportive    CURRENT CONCERNS Current Concerns  Post-Acute Placement   Other Concerns:    SOCIAL WORK ASSESSMENT / PLAN CSW met with pt and wife to address discharge plan. CSW introduced herself and explained role of social work. CSW also explained process of discharging to SNF with his insurance. PT is recommending CIR, however SNF is a back up.    Pt lives with wife and has supportive family. Pt would like CIR and is hopeful for insurance approval, however is agreeable to SNF if CIR is not an option.    CSW will inititate SNF search in [place] and [facility] and follow up with bed offer.   Assessment/plan status:  Psychosocial Support/Ongoing Assessment of Needs Other assessment/ plan:   Information/referral to community resources:   SNF list    PATIENTS/FAMILYS RESPONSE TO PLAN OF CARE: Pt was alert and oriented. Pt was very pleasant and was agreeable to discharge plan for short term rehab.

## 2012-02-01 NOTE — Progress Notes (Signed)
PT Cancellation Note  Treatment cancelled today due to pt eating dinner and with nursing in to perform nursing care. Will see 02/02/12.[name]  Minah Axelrod 02/01/2012, 4:50 PM Pager [phone]

## 2012-02-02 ENCOUNTER — Inpatient Hospital Stay (HOSPITAL_COMMUNITY)
Admission: RE | Admit: 2012-02-02 | Discharge: 2012-02-12 | DRG: 945 | Disposition: A | Discharge status: Home Health | Payer: Medicare Other | Source: Ambulatory Visit | Attending: Physical Medicine & Rehabilitation | Admitting: Physical Medicine & Rehabilitation

## 2012-02-02 ENCOUNTER — Encounter (HOSPITAL_COMMUNITY): Payer: Medicare Other | Admitting: Occupational Therapy

## 2012-02-02 ENCOUNTER — Inpatient Hospital Stay (HOSPITAL_COMMUNITY): Payer: Medicare Other | Admitting: Speech Pathology

## 2012-02-02 ENCOUNTER — Inpatient Hospital Stay (HOSPITAL_COMMUNITY): Payer: Medicare Other | Admitting: *Deleted

## 2012-02-02 ENCOUNTER — Inpatient Hospital Stay (HOSPITAL_COMMUNITY): Payer: Medicare Other | Admitting: Occupational Therapy

## 2012-02-02 ENCOUNTER — Encounter (HOSPITAL_COMMUNITY): Payer: Self-pay | Admitting: *Deleted

## 2012-02-02 DIAGNOSIS — I129 Hypertensive chronic kidney disease with stage 1 through stage 4 chronic kidney disease, or unspecified chronic kidney disease: Secondary | ICD-10-CM

## 2012-02-02 DIAGNOSIS — I6330 Cerebral infarction due to thrombosis of unspecified cerebral artery: Secondary | ICD-10-CM

## 2012-02-02 DIAGNOSIS — R29810 Facial weakness: Secondary | ICD-10-CM

## 2012-02-02 DIAGNOSIS — E785 Hyperlipidemia, unspecified: Secondary | ICD-10-CM

## 2012-02-02 DIAGNOSIS — Z882 Allergy status to sulfonamides status: Secondary | ICD-10-CM

## 2012-02-02 DIAGNOSIS — N189 Chronic kidney disease, unspecified: Secondary | ICD-10-CM

## 2012-02-02 DIAGNOSIS — Z87891 Personal history of nicotine dependence: Secondary | ICD-10-CM

## 2012-02-02 DIAGNOSIS — E119 Type 2 diabetes mellitus without complications: Secondary | ICD-10-CM

## 2012-02-02 DIAGNOSIS — Z7902 Long term (current) use of antithrombotics/antiplatelets: Secondary | ICD-10-CM

## 2012-02-02 DIAGNOSIS — Z5189 Encounter for other specified aftercare: Secondary | ICD-10-CM

## 2012-02-02 DIAGNOSIS — Z79899 Other long term (current) drug therapy: Secondary | ICD-10-CM

## 2012-02-02 DIAGNOSIS — K5900 Constipation, unspecified: Secondary | ICD-10-CM

## 2012-02-02 DIAGNOSIS — R471 Dysarthria and anarthria: Secondary | ICD-10-CM

## 2012-02-02 DIAGNOSIS — G8190 Hemiplegia, unspecified affecting unspecified side: Secondary | ICD-10-CM

## 2012-02-02 LAB — GLUCOSE, CAPILLARY: Glucose-Capillary: 135 mg/dL — ABNORMAL HIGH (ref 70–99)

## 2012-02-02 MED ORDER — OMEGA-3-ACID ETHYL ESTERS 1 G PO CAPS
1.0000 g | ORAL_CAPSULE | Freq: Every day | ORAL | Status: DC
  Administered 2012-02-03 – 2012-02-12 (×10): 1 g via ORAL
  Filled 2012-02-02 (×11): qty 1

## 2012-02-02 MED ORDER — PANTOPRAZOLE SODIUM 40 MG PO TBEC
40.0000 mg | DELAYED_RELEASE_TABLET | Freq: Every day | ORAL | Status: DC
  Administered 2012-02-03 – 2012-02-12 (×10): 40 mg via ORAL
  Filled 2012-02-02 (×14): qty 1

## 2012-02-02 MED ORDER — ONDANSETRON HCL 4 MG/2ML IJ SOLN: 4.0000 mg | Freq: Four times a day (QID) | INTRAMUSCULAR | Status: DC | PRN

## 2012-02-02 MED ORDER — CLOPIDOGREL BISULFATE 75 MG PO TABS
75.0000 mg | ORAL_TABLET | Freq: Every day | ORAL | Status: DC
  Administered 2012-02-03 – 2012-02-12 (×10): 75 mg via ORAL
  Filled 2012-02-02 (×11): qty 1

## 2012-02-02 MED ORDER — POLYETHYLENE GLYCOL 3350 17 G PO PACK
17.0000 g | PACK | Freq: Every day | ORAL | Status: DC | PRN
  Filled 2012-02-02: qty 1

## 2012-02-02 MED ORDER — INSULIN ASPART 100 UNIT/ML ~~LOC~~ SOLN
0.0000 [IU] | Freq: Three times a day (TID) | SUBCUTANEOUS | Status: DC
  Administered 2012-02-02 – 2012-02-03 (×4): 1 [IU] via SUBCUTANEOUS
  Administered 2012-02-04: 2 [IU] via SUBCUTANEOUS
  Administered 2012-02-04: 3 [IU] via SUBCUTANEOUS
  Administered 2012-02-04 – 2012-02-05 (×2): 1 [IU] via SUBCUTANEOUS
  Administered 2012-02-05: 2 [IU] via SUBCUTANEOUS
  Administered 2012-02-05: 1 [IU] via SUBCUTANEOUS
  Administered 2012-02-06: 2 [IU] via SUBCUTANEOUS
  Administered 2012-02-06: 1 [IU] via SUBCUTANEOUS
  Administered 2012-02-06 – 2012-02-07 (×2): 2 [IU] via SUBCUTANEOUS
  Administered 2012-02-07: 1 [IU] via SUBCUTANEOUS
  Administered 2012-02-08 (×2): 2 [IU] via SUBCUTANEOUS
  Administered 2012-02-09: 1 [IU] via SUBCUTANEOUS
  Administered 2012-02-09 – 2012-02-10 (×2): 2 [IU] via SUBCUTANEOUS
  Administered 2012-02-10 – 2012-02-11 (×3): 1 [IU] via SUBCUTANEOUS
  Administered 2012-02-11 – 2012-02-12 (×2): 2 [IU] via SUBCUTANEOUS

## 2012-02-02 MED ORDER — METOPROLOL TARTRATE 12.5 MG HALF TABLET
12.5000 mg | ORAL_TABLET | Freq: Two times a day (BID) | ORAL | Status: DC
  Administered 2012-02-02 – 2012-02-12 (×17): 12.5 mg via ORAL
  Filled 2012-02-02 (×23): qty 1

## 2012-02-02 MED ORDER — ACETAMINOPHEN 325 MG PO TABS: 325.0000 mg | ORAL_TABLET | ORAL | Status: DC | PRN

## 2012-02-02 MED ORDER — ONDANSETRON HCL 4 MG PO TABS: 4.0000 mg | ORAL_TABLET | Freq: Four times a day (QID) | ORAL | Status: DC | PRN

## 2012-02-02 MED ORDER — LOSARTAN POTASSIUM 50 MG PO TABS
50.0000 mg | ORAL_TABLET | Freq: Every day | ORAL | Status: DC
  Administered 2012-02-02 – 2012-02-12 (×11): 50 mg via ORAL
  Filled 2012-02-02 (×12): qty 1

## 2012-02-02 MED ORDER — SIMVASTATIN 5 MG PO TABS
5.0000 mg | ORAL_TABLET | Freq: Every day | ORAL | Status: DC
  Administered 2012-02-02 – 2012-02-11 (×10): 5 mg via ORAL
  Filled 2012-02-02 (×11): qty 1

## 2012-02-02 MED ORDER — GLIMEPIRIDE 4 MG PO TABS
4.0000 mg | ORAL_TABLET | Freq: Every day | ORAL | Status: DC
  Administered 2012-02-03 – 2012-02-12 (×10): 4 mg via ORAL
  Filled 2012-02-02 (×12): qty 1

## 2012-02-02 MED ORDER — SORBITOL 70 % SOLN: 30.0000 mL | Freq: Every day | Status: DC | PRN

## 2012-02-02 NOTE — Progress Notes (Signed)
Physical Therapy Treatment Patient Details Name: Tyler [name] MRN: [id number] DOB: 05/04/1929 Today's Date: 02/02/2012 Time: 1191-4782 PT Time Calculation (min): 24 min  PT Assessment / Plan / Recommendation Comments on Treatment Session  Patient continuing to progress well with ambulation. Patient would benefit from CIR, awaiting insurance at this time    Follow Up Recommendations  Post acute inpatient rehab    Barriers to Discharge        Equipment Recommendations  Rolling walker with 5" wheels;3 in 1 bedside comode    Recommendations for Other Services Rehab consult  Frequency Min 4X/week   Plan Discharge plan remains appropriate;Frequency remains appropriate    Precautions / Restrictions Precautions Precautions: Fall   Pertinent Vitals/Pain     Mobility  Bed Mobility Supine to Sit: 5: Supervision Sitting - Scoot to Edge of Bed: 5: Supervision Transfers Sit to Stand: 4: Min assist;With upper extremity assist;From bed;From chair/3-in-1;With armrests Stand to Sit: 4: Min assist;With upper extremity assist;With armrests;To bed;To chair/3-in-1 Details for Transfer Assistance: A for safety as patient slightly unsteady, especially after several stands. Cues for safe hand placement Ambulation/Gait Ambulation/Gait Assistance: 4: Min assist Ambulation Distance (Feet): 150 Feet (one seated rest break) Assistive device: Rolling walker Ambulation/Gait Assistance Details: Patient able to grasp RW with right hand this session requring less external support. Patient with decreased step length in  RLE with cueing to heel strike and not hyperextend at knee. Patient progressing well. Required one seated rest break Gait Pattern: Step-through pattern;Decreased stride length Gait velocity: decreased Modified Rankin (Stroke Patients Only) Pre-Morbid Rankin Score: No symptoms Modified Rankin: Moderate disability    Exercises General Exercises - Lower Extremity Long Arc Quad: AROM;Right;10  reps Heel Slides: AROM;10 reps;Right Straight Leg Raises: AAROM;10 reps;Right Hip Flexion/Marching: AROM;Right;10 reps   PT Diagnosis:    PT Problem List:   PT Treatment Interventions:     PT Goals Acute Rehab PT Goals PT Goal: Supine/Side to Sit - Progress: Progressing toward goal PT Goal: Sit to Supine/Side - Progress: Progressing toward goal PT Goal: Sit to Stand - Progress: Progressing toward goal PT Goal: Stand to Sit - Progress: Progressing toward goal PT Transfer Goal: Bed to Chair/Chair to Bed - Progress: Progressing toward goal PT Goal: Ambulate - Progress: Progressing toward goal  Visit Information  Last PT Received On: 02/02/12 Assistance Needed: +2 (safety/chair)    Subjective Data      Cognition  Overall Cognitive Status: Appears within functional limits for tasks assessed/performed Arousal/Alertness: Awake/alert Orientation Level: Appears intact for tasks assessed Behavior During Session: [facility] for tasks performed    Balance     End of Session PT - End of Session Equipment Utilized During Treatment: Gait belt Activity Tolerance: Patient tolerated treatment well Patient left: in chair;with call bell/phone within reach Nurse Communication: Mobility status   GP     Fredrich Birks 02/02/2012, 11:41 AM 02/02/2012 Fredrich Birks PTA [phone] [phone] office

## 2012-02-02 NOTE — H&P (View-Only) (Signed)
Physical Medicine and Rehabilitation Admission H&P    Chief Complaint  Patient presents with  . Code Stroke  : HPI: Tyler [name] is a 78 y.o. right-handed male with history of hypertension, chronic renal insufficiency with baseline creatinine 1.7. Admitted 01/28/2012 with right sided weakness and slurred speech. MRI of the brain showed acute nonhemorrhagic infarct posterior left lenticular nucleus extending into the posterior aspect of the left corona radiata. MRA of the head with intracranial atherosclerotic type changes as well 1.4 mm aneurysm posterior lateral aspect left internal carotid artery. Echocardiogram with ejection fraction of 70% and grade 1 diastolic dysfunction. Carotid Dopplers with no ICA stenosis. Patient did not receive TPA. Neurology services consulted placed on Plavix therapy for stroke prophylaxis.  Dr. Darrick Penna vascular surgery notified in regards to small aneurysm identified on MRA advised no surgical intervention or further workup at this time. Findings of mildly elevated hemoglobin A1c of 6.7 and placed on Amaryl with monitoring of blood sugars as well as diabetic diet. Physical therapy ongoing with occupational therapy evaluations pending with recommendations of physical medicine rehabilitation consult to consider inpatient rehabilitation services. Patient was felt to be a good candidate for inpatient rehabilitation services and was admitted for comprehensive rehabilitation program  Review of Systems  Gastrointestinal: Positive for constipation.  Musculoskeletal: Positive for myalgias.  Neurological: Positive for weakness.  All other systems reviewed and are negative   Past Medical History  Diagnosis Date  . Hypertension   . Cancer    Past Surgical History  Procedure Date  . Prostate surgery   . Cardiac catheterization    History reviewed. No pertinent family history. Social History:  reports that he quit smoking about 43 years ago. He does not have any  smokeless tobacco history on file. He reports that he does not drink alcohol or use illicit drugs. Allergies:  Allergies  Allergen Reactions  . Sulfa Drugs Cross Reactors Rash   Medications Prior to Admission  Medication Sig Dispense Refill  . Cholecalciferol (VITAMIN D-3 PO) Take 1 tablet by mouth daily.      . Cyanocobalamin (VITAMIN B-12 PO) Take 1 tablet by mouth daily.      . fish oil-omega-3 fatty acids 1000 MG capsule Take 1 g by mouth daily.      . Flaxseed, Linseed, (FLAX SEEDS PO) Take 1 tablet by mouth daily.      [name] glimepiride (AMARYL) 4 MG tablet Take 4 mg by mouth daily before breakfast.      . pantoprazole (PROTONIX) 40 MG tablet Take 40 mg by mouth daily.      . pravastatin (PRAVACHOL) 10 MG tablet Take 10 mg by mouth daily.      . STUDY MEDICATION Inject 9 Units into the skin daily.      [name] DISCONTD: aspirin EC 81 MG tablet Take 81 mg by mouth daily.      [name] DISCONTD: losartan (COZAAR) 50 MG tablet Take 50 mg by mouth daily.        Home: Home Living Lives With: Spouse Available Help at Discharge: Family;Available PRN/intermittently (wife works during the day) Type of Home: House Home Access: Level entry Home Layout: One level (2 steps inside house, no rails) Bathroom Shower/Tub: Tub/shower unit;Curtain Firefighter: Standard Bathroom Accessibility: Yes How Accessible: Accessible via walker Home Adaptive Equipment: Straight cane   Functional History: Prior Function Able to Take Stairs?: Yes Driving: Yes Vocation: Retired Comments: Agricultural consultant at [employer] Status:  Mobility: Bed Mobility Bed Mobility: Not assessed Supine  to Sit: 3: Mod assist Sitting - Scoot to Edge of Bed: 3: Mod assist Transfers Transfers: Sit to Stand;Stand to Sit;Stand Pivot Transfers Sit to Stand: 4: Min assist;With upper extremity assist;From chair/3-in-1 Stand to Sit: 4: Min assist;With upper extremity assist;To chair/3-in-1 Stand Pivot Transfers: 3: Mod  assist Ambulation/Gait Ambulation/Gait Assistance: 1: +2 Total assist Ambulation/Gait: Patient Percentage: 70% Ambulation Distance (Feet): 20 Feet Assistive device: Large base quad cane Ambulation/Gait Assistance Details: Assist x 1 for stability while other assist with proper placement of RLE as well as assist to maintain stability of R knee to avoid hyperextension and buckling. RUE supported by therapist as well as stability to avoid right anterior lean during ambulation. Manual facilitation through hip for control of placement, and ankle to avoid eversion steppage.  Gait Pattern: Ataxic;Trunk flexed;Decreased stride length;Decreased hip/knee flexion - right;Decreased weight shift to right;Step-to pattern Gait velocity: decreased gait speed    ADL: ADL Upper Body Dressing: Performed;Minimal assistance Where Assessed - Upper Body Dressing: Supported sitting Lower Body Dressing: Performed;Moderate assistance Where Assessed - Lower Body Dressing: Supported sitting Toilet Transfer: Simulated;Other (comment);Moderate assistance (performed sit to stand  x 4) Toilet Transfer Method: Sit to stand;Other (comment) (very cues and hands on assist for controlled descent) Toilet Transfer Equipment:  (bed to chair) Equipment Used: Gait belt Transfers/Ambulation Related to ADLs: Pt participated in functional sit to stand in prep for increased I with ADL activity. Pt tended to plop in chairs and nothave L hand placed on arm rest for stand to sit transition. ADL Comments: decreased balance sitting EOB for ADLs.  Cognition: Cognition Overall Cognitive Status: Appears within functional limits for tasks assessed Arousal/Alertness: Awake/alert Orientation Level: Oriented X4 Attention: Focused Focused Attention: Appears intact Memory: Appears intact Awareness: Appears intact Problem Solving: Appears intact Safety/Judgment: Appears intact Cognition Overall Cognitive Status: Appears within functional  limits for tasks assessed/performed Arousal/Alertness: Awake/alert Orientation Level: Oriented X4 / Intact Behavior During Session: [facility] for tasks performed   Blood pressure 155/66, pulse 66, temperature 97.7 F (36.5 C), temperature source Oral, resp. rate 18, height 5\' 10"  (1.778 m), weight 83.6 kg (184 lb 4.9 oz), SpO2 98.00%. Physical Exam  Vitals reviewed.  Constitutional: He is oriented to person, place, and time. He appears well-developed.  HENT:  Right facial droop  Eyes:  Pupils round and reactive to light  Neck: Neck supple. No thyromegaly present.  Cardiovascular: Normal rate and regular rhythm.  Pulmonary/Chest: Breath sounds normal. No respiratory distress.  Abdominal: Bowel sounds are normal. He exhibits no distension. There is no tenderness.  Musculoskeletal: He exhibits no edema.  Neurological: He is alert and oriented to person, place, and time.  Patient makes eye contact with examiner. His speech is mildly dysarthric but fully intelligible. He follows three-step commands. Right pronator drift. RUE is 4-/5 RLE is 4/5. Right central 7 and tongue deviation noted. Minimal sensory findings noted on the right. Mild decrease in [facility] upper greater than lower. Cognitively he displays normal insight and awareness. Skin: Skin is warm and dry.  Psychiatric: He has a normal mood and affect. His behavior is normal   Results for orders placed during the hospital encounter of 01/28/12 (from the past 48 hour(s))  GLUCOSE, CAPILLARY     Status: Abnormal   Collection Time   01/30/12  4:10 PM      Component Value Range Comment   Glucose-Capillary 100 (*) 70 - 99 mg/dL   GLUCOSE, CAPILLARY     Status: Abnormal   Collection Time  01/30/12  9:32 PM      Component Value Range Comment   Glucose-Capillary 173 (*) 70 - 99 mg/dL   GLUCOSE, CAPILLARY     Status: Abnormal   Collection Time   01/31/12  7:42 AM      Component Value Range Comment   Glucose-Capillary 114 (*) 70 - 99 mg/dL    BASIC METABOLIC PANEL     Status: Abnormal   Collection Time   01/31/12 10:25 AM      Component Value Range Comment   Sodium 134 (*) 135 - 145 mEq/L    Potassium 4.3  3.5 - 5.1 mEq/L    Chloride 98  96 - 112 mEq/L    CO2 24  19 - 32 mEq/L    Glucose, Bld 165 (*) 70 - 99 mg/dL    BUN 28 (*) 6 - 23 mg/dL    Creatinine, Ser 1.61 (*) 0.50 - 1.35 mg/dL    Calcium 09.6  8.4 - 10.5 mg/dL    GFR calc non Af Amer 41 (*) >90 mL/min    GFR calc Af Amer 48 (*) >90 mL/min   MAGNESIUM     Status: Normal   Collection Time   01/31/12 10:25 AM      Component Value Range Comment   Magnesium 2.2  1.5 - 2.5 mg/dL   TROPONIN I     Status: Normal   Collection Time   01/31/12 10:25 AM      Component Value Range Comment   Troponin I <0.30  <0.30 ng/mL   PHOSPHORUS     Status: Normal   Collection Time   01/31/12 10:25 AM      Component Value Range Comment   Phosphorus 2.7  2.3 - 4.6 mg/dL   GLUCOSE, CAPILLARY     Status: Abnormal   Collection Time   01/31/12 11:26 AM      Component Value Range Comment   Glucose-Capillary 146 (*) 70 - 99 mg/dL   GLUCOSE, CAPILLARY     Status: Abnormal   Collection Time   01/31/12  4:11 PM      Component Value Range Comment   Glucose-Capillary 137 (*) 70 - 99 mg/dL   GLUCOSE, CAPILLARY     Status: Abnormal   Collection Time   01/31/12  9:52 PM      Component Value Range Comment   Glucose-Capillary 183 (*) 70 - 99 mg/dL    Comment 1 Documented in Chart      Comment 2 Notify RN     GLUCOSE, CAPILLARY     Status: Abnormal   Collection Time   02/01/12  5:59 AM      Component Value Range Comment   Glucose-Capillary 133 (*) 70 - 99 mg/dL    Comment 1 Documented in Chart      Comment 2 Notify RN     GLUCOSE, CAPILLARY     Status: Abnormal   Collection Time   02/01/12 11:37 AM      Component Value Range Comment   Glucose-Capillary 161 (*) 70 - 99 mg/dL    No results found.  Post Admission Physician Evaluation: 1. Functional deficits secondary  to thrombotic left  lenticular nucleus --> Corona Radiata infarct. 2. Patient is admitted to receive collaborative, interdisciplinary care between the physiatrist, rehab nursing staff, and therapy team. 3. Patient's level of medical complexity and substantial therapy needs in context of that medical necessity cannot be provided at a lesser intensity of care such as a  SNF. 4. Patient has experienced substantial functional loss from his/her baseline which was documented above under the "Functional History" and "Functional Status" headings.  Judging by the patient's diagnosis, physical exam, and functional history, the patient has potential for functional progress which will result in measurable gains while on inpatient rehab.  These gains will be of substantial and practical use upon discharge  in facilitating mobility and self-care at the household level. 5. Physiatrist will provide 24 hour management of medical needs as well as oversight of the therapy plan/treatment and provide guidance as appropriate regarding the interaction of the two. 6. 24 hour rehab nursing will assist with bladder management, bowel management, safety, skin/wound care, disease management, medication administration, pain management and patient education  and help integrate therapy concepts, techniques,education, etc. 7. PT will assess and treat for:  fxnl mobility, NMR, balance, adaptive equipment, safety.  Goals are: mod I. 8. OT will assess and treat for: UES, NMR, fnxl mobility, ADL's, safety.   Goals are: mod I to supervision. 9. SLP will assess and treat for: speech intelligibility/communication.  Goals are: mod I. 10. Case Management and Social Worker will assess and treat for psychological issues and discharge planning. 11. Team conference will be held weekly to assess progress toward goals and to determine barriers to discharge. 12. Patient will receive at least 3 hours of therapy per day at least 5 days per week. 13. ELOS: 1-2 weeks       Prognosis:  excellent   Medical Problem List and Plan: 1. Thrombotic left lenticular nucleus-corona radiata infarct 2. DVT Prophylaxis/Anticoagulation: SCDs. Monitor for signs of DVT 3. Mood: Pt is generally up beat and motivated. Team to provide egosupport as appropriate 4. Neuropsych: This patient is capable of making decisions on his/her own behalf. 5. Hypertension. Lopressor 12.5 mg twice a day, Cozaar 50 mg daily. Monitor with increased mobility 6. Diabetes mellitus. Hemoglobin A1c 6.7. Amaryl 4 mg daily. Check blood sugars a.c. and at bedtime 7. Hyperlipidemia. Lovaza and Zocor 8. Chronic renal insufficiency. Baseline creatinine 1.6. Followup labs and encourage adequate fluid intake.     Ivory Broad, MD

## 2012-02-02 NOTE — Progress Notes (Signed)
Patient arrived on the unit around 1530 with wife. Oriented patient to the rehab floor and explained our normal day to day routines. Gave patient booklets related to stroke. Patient verbalized understanding, no questions at this time.Patient appears to be comfortable and excited to begin rehab.

## 2012-02-02 NOTE — Plan of Care (Signed)
Overall Plan of Care [facility]) Patient Details Name: GREGARY BLACKARD MRN: [id number] DOB: 10-05-1928  Diagnosis:  Rehabilitation for CVA  Primary Diagnosis:    Stroke-left subcortical infarct with right hemiparesis Co-morbidities: hypertension, osteoarthritis  Functional Problem List  Patient demonstrates impairments in the following areas: Balance, Cognition, Endurance, Medication Management, Motor, Nutrition, Safety, Sensory  and Skin Integrity  Basic ADL's: eating, grooming, bathing, dressing and toileting Advanced ADL's: simple meal preparation  Transfers:  bed mobility, bed to chair, car, furniture and floor Locomotion:  ambulation and stairs  Additional Impairments:  None  Anticipated Outcomes Item Anticipated Outcome  Eating/Swallowing    Basic self-care    Tolieting    Bowel/Bladder  Continent of bowel/bladder with min assist  Transfers  Basic, furniture, floor modified independent; supervision car  Locomotion  Ambulation x 150' controlled, 50' home modified independent; min assist 5 stairs   Communication  Modified independent for comprehension and expression.  Cognition  Supervision level  Pain  Pain level less than/equal to level 3  Safety/Judgment  Supervision level  Other     Therapy Plan: PT Frequency: 1-2 X/day, 60-90 minutes;5 out of 7 days OT Frequency: 1-2 X/day, 60-90 minutes ST Frequency: 1-2x/day, 30-60 minutes, 5/7 days SLP Frequency: 5 out of 7 days;1-2 X/day, 30-60 minutes  Team Interventions: Item RN PT OT SLP SW TR Other  Self Care/Advanced ADL Retraining   x      Neuromuscular Re-Education  x x      Therapeutic Activities  x x      UE/LE Strength Training/ROM  x x      UE/LE Coordination Activities  x x      Visual/Perceptual Remediation/Compensation   x      DME/Adaptive Equipment Instruction  x x      Therapeutic Exercise  x x      Balance/Vestibular Training  x x      Patient/Family Education x x x x     Cognitive  Remediation/Compensation   x x     Functional Mobility Training  x x      Ambulation/Gait Training  x       Stair Training  x       Wheelchair Propulsion/Positioning  x       Functional Statistician   x      Community Reintegration   x      Dysphagia/Aspiration Landscape architect Facilitation    x     Bladder Management x        Bowel Management x        Disease Management/Prevention x        Pain Management x        Medication Management x        Skin Care/Wound Management x        Splinting/Orthotics  x       Discharge Planning x x x      Psychosocial Support x                           Team Discharge Planning: Destination:  Home Projected Follow-up:  PT, ST, OT Projected Equipment Needs:  Walker Patient/family involved in discharge planning:  Yes  MD ELOS: 7-10 days Medical Rehab Prognosis:  Excellent Assessment: 78 year old male functionally independent admitted with right hemiparesis due to CVA. Now require 24 7 rehabilitation RN and M.D. As well as CIR level  PT OT

## 2012-02-02 NOTE — Interval H&P Note (Signed)
KHALEN STYER was admitted today to Inpatient Rehabilitation with the diagnosis of left lenticular nucleus to corona radiata infarct.  The patient's history has been reviewed, patient examined, and there is no change in status.  Patient continues to be appropriate for intensive inpatient rehabilitation.  I have reviewed the patient's chart and labs.  Questions were answered to the patient's satisfaction.  SWARTZ,ZACHARY T 02/02/2012, 8:53 PM

## 2012-02-02 NOTE — Clinical Social Work Note (Signed)
Clinical Social Work  Pt is ready for discharge and will be admitted to Inpatient Rehab. CIR has obtained insurance prior authorization. Pt is agreeable to discharge plan. RN, RNCM, and charge RN are aware of dispo. CSW is signing off as no further needs identified.  Dede Query, MSW, Theresia Majors [phone]

## 2012-02-02 NOTE — Progress Notes (Signed)
Received approval from [facility] for pt to admit to CIR today. Pt in agreement with plan. Informed pt's MD and SW. Await d/c order from Dr Thedore Mins. Call for questions: [phone]

## 2012-02-03 ENCOUNTER — Inpatient Hospital Stay (HOSPITAL_COMMUNITY): Payer: Medicare Other | Admitting: Speech Pathology

## 2012-02-03 ENCOUNTER — Inpatient Hospital Stay (HOSPITAL_COMMUNITY): Payer: Medicare Other

## 2012-02-03 DIAGNOSIS — Z5189 Encounter for other specified aftercare: Secondary | ICD-10-CM

## 2012-02-03 DIAGNOSIS — I6330 Cerebral infarction due to thrombosis of unspecified cerebral artery: Secondary | ICD-10-CM

## 2012-02-03 LAB — CBC WITH DIFFERENTIAL/PLATELET
Basophils Absolute: 0 10*3/uL (ref 0.0–0.1)
Basophils Relative: 0 % (ref 0–1)
Eosinophils Absolute: 0.6 10*3/uL (ref 0.0–0.7)
Eosinophils Relative: 8 % — ABNORMAL HIGH (ref 0–5)
HCT: 38.8 % — ABNORMAL LOW (ref 39.0–52.0)
MCHC: 34.3 g/dL (ref 30.0–36.0)
MCV: 90.9 fL (ref 78.0–100.0)
Monocytes Absolute: 0.5 10*3/uL (ref 0.1–1.0)
RDW: 12.8 % (ref 11.5–15.5)

## 2012-02-03 LAB — COMPREHENSIVE METABOLIC PANEL
AST: 13 U/L (ref 0–37)
Albumin: 3.5 g/dL (ref 3.5–5.2)
Calcium: 9.6 mg/dL (ref 8.4–10.5)
Creatinine, Ser: 1.62 mg/dL — ABNORMAL HIGH (ref 0.50–1.35)
GFR calc non Af Amer: 38 mL/min — ABNORMAL LOW (ref >90)

## 2012-02-03 LAB — GLUCOSE, CAPILLARY
Glucose-Capillary: 130 mg/dL — ABNORMAL HIGH (ref 70–99)
Glucose-Capillary: 147 mg/dL — ABNORMAL HIGH (ref 70–99)
Glucose-Capillary: 208 mg/dL — ABNORMAL HIGH (ref 70–99)

## 2012-02-03 NOTE — Evaluation (Addendum)
Physical Therapy Assessment and Plan  Patient Details  Name: Tyler [name] MRN: [id number] Date of Birth: [birth year]  PT Diagnosis: Abnormality of gait, Difficulty walking and Hemiparesis dominant Rehab Potential: Good ELOS: 2 weeks   Today's Date: 02/03/2012 Time:  -  0805-0900,  55 min    Problem List:  Patient Active Problem List  Diagnosis  . Speech abnormality  . Cerebral embolism with cerebral infarction  . CVA (cerebral infarction)  . DM2 (diabetes mellitus, type 2)  . Hyperlipidemia  . History of prostate cancer  . Stroke    Past Medical History:  Past Medical History  Diagnosis Date  . Hypertension   . Cancer    Past Surgical History:  Past Surgical History  Procedure Date  . Prostate surgery   . Cardiac catheterization     Assessment & Plan Clinical Impression: STACIE KNUTZEN is a 78 y.o. right-handed male with history of hypertension, chronic renal insufficiency with baseline creatinine 1.7. Admitted 01/28/2012 with right sided weakness and slurred speech. MRI of the brain showed acute nonhemorrhagic infarct posterior left lenticular nucleus extending into the posterior aspect of the left corona radiata. MRA of the head with intracranial atherosclerotic type changes as well 1.4 mm aneurysm posterior lateral aspect left internal carotid artery. Echocardiogram with ejection fraction of 70% and grade 1 diastolic dysfunction. Carotid Dopplers with no ICA stenosis. Patient did not receive TPA. Neurology services consulted placed on Plavix therapy for stroke prophylaxis.  Dr. Darrick Penna vascular surgery notified in regards to small aneurysm identified on MRA advised no surgical intervention or further workup at this time.    Patient transferred to CIR on 02/02/2012 .   Patient currently requires mod with mobility secondary to impaired timing and sequencing, unbalanced muscle activation and decreased coordination.  Prior to hospitalization, patient was independent with mobility  and lived with Spouse in a House home.  Home access is  Level entry.  Patient will benefit from skilled PT intervention to maximize safe functional mobility, minimize fall risk and decrease caregiver burden for planned discharge home with intermittent assist.  Anticipate patient will benefit from follow up HHPT vs OP at discharge.  PT - End of Session Activity Tolerance: Tolerates 30+ min activity with multiple rests Endurance Deficit: Yes Endurance Deficit Description: fatigue after ambulating 54' PT Assessment Rehab Potential: Good Barriers to Discharge: Decreased caregiver support (wife works 6am-2pm) PT Plan PT Frequency: 1-2 X/day, 60-90 minutes;5 out of 7 days Estimated Length of Stay: 2 weeks PT Treatment/Interventions: Ambulation/gait training;Balance/vestibular training;Discharge planning;DME/adaptive equipment instruction;Functional mobility training;Functional electrical stimulation;Neuromuscular re-education;Patient/family education;Splinting/orthotics;Stair training;Therapeutic Activities;Therapeutic Exercise;UE/LE Coordination activities;UE/LE Strength taining/ROM;Wheelchair propulsion/positioning PT Recommendation Follow Up Recommendations: Outpatient PT;Home health PT (TBD) Equipment Recommended: Rolling walker with 5" wheels;Quad cane (TBD)  PT Evaluation Precautions/Restrictions Precautions Precautions: Fall Restrictions Weight Bearing Restrictions: No   Pain Pain Assessment Pain Assessment: No/denies pain Home Living/Prior Functioning Home Living Lives With: Spouse Available Help at Discharge: Family;Available PRN/intermittently (wife works first shift) Type of Home: House Home Access: Level entry at back; 3 STE no rails in front Home Layout: One level (3 steps inside house, no rails) Bathroom Shower/Tub: Tub/shower unit;Curtain Bathroom Toilet: Standard Bathroom Accessibility: Yes How Accessible: Accessible via walker Prior Function Level of Independence:  Independent with basic ADLs;Independent with transfers Able to Take Stairs?: Yes Driving: Yes Vocation: Retired Comments: Agricultural consultant at [employer] - History Baseline Vision: Wears glasses only for reading Patient Visual Report: No change from baseline Praxis Praxis: Impaired Praxis Impairment Details: Motor planning  Praxis-Other Comments: RUE motor planning deficits  Cognition Overall Cognitive Status: Appears within functional limits for tasks assessed Arousal/Alertness: Awake/alert Orientation Level: Oriented X4 Sensation Sensation Light Touch: Appears Intact Proprioception: Appears Intact Coordination Heel Shin Test: slightly decreased speed and accuracy RLE Motor  Motor Motor: Hemiplegia Motor - Skilled Clinical Observations: R hemiparesis  Mobility Bed Mobility Bed Mobility: Rolling Right;Rolling Left;Right Sidelying to Sit Rolling Right: 5: Supervision Rolling Right Details: Verbal cues for technique;Manual facilitation for placement Rolling Left: 3: Mod assist Rolling Left Details: Verbal cues for technique;Manual facilitation for placement Right Sidelying to Sit: 3: Mod assist Right Sidelying to Sit Details: Verbal cues for technique;Manual facilitation for weight shifting Sitting - Scoot to Edge of Bed: 4: Min assist Transfers Sit to Stand: 4: Min assist Sit to Stand Details: Tactile cues for placement Stand to Sit: 4: Min assist Stand to Sit Details (indicate cue type and reason): Verbal cues for technique Stand Pivot Transfers: 3: Mod assist Stand Pivot Transfer Details: Tactile cues for weight shifting;Tactile cues for posture;Verbal cues for technique Locomotion  Ambulation Ambulation: Yes Ambulation/Gait Assistance: 3: Mod assist Ambulation Distance (Feet): 87 Feet Ambulation/Gait Assistance Details: Manual facilitation for weight shifting;Verbal cues for technique Gait Gait: Yes Gait Pattern: Impaired Gait Pattern: Decreased  hip/knee flexion - right;Shuffle;Narrow base of support;Decreased step length - left;Decreased stance time - right;Step-through pattern Stairs / Additional Locomotion Stairs: Yes Stairs Assistance: 4: Min assist Stairs Assistance Details: Verbal cues for technique (R toes catch on edge of each step when ascending) Stair Management Technique: Two rails Number of Stairs: 5  Height of Stairs: 7  Wheelchair Mobility Wheelchair Mobility: Yes Wheelchair Assistance: 3: Mod Education officer, museum: Both upper extremities Wheelchair Parts Management: Needs assistance Distance: 25  Trunk/Postural Assessment  Cervical Assessment Cervical Assessment: Within Functional Limits Thoracic Assessment Thoracic Assessment: Within Functional Limits Lumbar Assessment Lumbar Assessment: Within Functional Limits Postural Control Postural Control: Within Functional Limits  Balance Balance Balance Assessed: Yes Static Sitting Balance Static Sitting - Balance Support: Feet supported Static Sitting - Level of Assistance: 5: Stand by assistance Dynamic Sitting Balance Dynamic Sitting - Level of Assistance: 5: Stand by assistance Static Standing Balance Static Standing - Balance Support: No upper extremity supported Static Standing - Level of Assistance: 3: Mod assist Extremity Assessment      RLE Assessment RLE Assessment: Exceptions to [facility] RLE Strength RLE Overall Strength Comments: grossly 4+/5; reduced coordination LLE Assessment LLE Assessment: Within Functional Limits  See FIM for current functional status Refer to Care Plan for Long Term Goals  Recommendations for other services: None  Discharge Criteria: Patient will be discharged from PT if patient refuses treatment 3 consecutive times without medical reason, if treatment goals not met, if there is a change in medical status, if patient makes no progress towards goals or if patient is discharged from hospital.  The above  assessment, treatment plan, treatment alternatives and goals were discussed and mutually agreed upon: by patient  Kery Batzel 02/03/2012, 10:15 AM

## 2012-02-03 NOTE — Evaluation (Signed)
Speech Language Pathology Assessment and Plan  Patient Details  Name: Tyler [name] MRN: [id number] Date of Birth: 1928-12-21  SLP Diagnosis:    Rehab Potential: Peri [place] ELOS: February 16, 2012   Today's Date: 02/03/2012 Time: 1030-1130 Time Calculation (min): 60 min  Problem List:  Patient Active Problem List  Diagnosis  . Speech abnormality  . Cerebral embolism with cerebral infarction  . CVA (cerebral infarction)  . DM2 (diabetes mellitus, type 2)  . Hyperlipidemia  . History of prostate cancer  . Stroke   Past Medical History:  Past Medical History  Diagnosis Date  . Hypertension   . Cancer    Past Surgical History:  Past Surgical History  Procedure Date  . Prostate surgery   . Cardiac catheterization     Assessment / Plan / Recommendation Clinical Impression  Session today focused on evaluation of swallow safety, speech, language, and cognition. Pt exhibits mild-mod right facial weakness, and indicates tendency to pocket food on the right.  No overt s/s aspiration observed or reported..  Pt speech is mild-moderately dysarthric, with intelligibility at ~80%. MoCA was administered during this session.  Pt scored 18/30 (N=26/30) overall, with impairment noted in immediate and delayed recall, selective attention, serial subtraction, verbal fluency, and abstract reasoning. Prior to admit, pt was independent, staying at home while his wife worked.  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    SLP Assessment  Patient will need skilled Speech Lanaguage Pathology Services during CIR admission    Recommendations  Follow up Recommendations: 24 hour supervision/assistance Equipment Recommended: None recommended by SLP    SLP Frequency 5 out of 7 days;1-2 X/day, 30-60 minutes   SLP Treatment/Interventions Cognitive  remediation/compensation;Cueing hierarchy;Functional tasks;Multimodal communication approach;Internal/external aids;Medication managment;Oral motor exercises;Patient/family education;Neuromuscular electrical stimulation    Pain Pain Assessment Pain Assessment: No/denies pain Prior Functioning Cognitive/Linguistic Baseline: Within functional limits Type of Home: House Lives With: Spouse Available Help at Discharge: Family;Available PRN/intermittently Education: [employer] Graduate Vocation: Retired  Teacher, music Term Goals: Week 1: SLP Short Term Goal 1 (Week 1): Pt will improve speech intelligibility by completing 10 reps of oral motor exercises for increased ROM, strength, and coordination for functional verbal communication SLP Short Term Goal 2 (Week 1): Pt will increase functional speech production for verbalizing and using compensatory strategies to 80% accuracy given mod cues. SLP Short Term Goal 3 (Week 1): Pt will improve functional thought organization and reasoning skills by completing tasks of increasing complexity with 75% accuracy given mod cues. SLP Short Term Goal 4 (Week 1): Pt will increase functional cognitive skills for STM recall of information at sentence level after 5 minute time lapse to 80% accuracy given min cues.  See FIM for current functional status Refer to Care Plan for Long Term Goals  Recommendations for other services: None  Discharge Criteria: Patient will be discharged from SLP if patient refuses treatment 3 consecutive times without medical reason, if treatment goals not met, if there is a change in medical status, if patient makes no progress towards goals or if patient is discharged from hospital.  The above assessment, treatment plan, treatment alternatives and goals were discussed and mutually agreed upon: by patient  Tyler [name] B. [place], CCC-SLP [phone]  Tyler [name] 02/03/2012, 12:47 PM

## 2012-02-03 NOTE — Care Management Note (Signed)
Inpatient Rehabilitation Center Individual Statement of Services  Patient Name:  Tyler [name]  Date:  02/03/2012  Welcome to the Inpatient Rehabilitation Center.  Our goal is to provide you with an individualized program based on your diagnosis and situation, designed to meet your specific needs.  With this comprehensive rehabilitation program, you will be expected to participate in at least 3 hours of rehabilitation therapies Monday-Friday, with modified therapy programming on the weekends.  Your rehabilitation program will include the following services:  Physical Therapy (PT), Occupational Therapy (OT), Speech Therapy (ST), 24 hour per day rehabilitation nursing, Therapeutic Recreaction (TR), Neuropsychology, Case Management (RN and Social Worker), Rehabilitation Medicine, Nutrition Services and Pharmacy Services  Weekly team conferences will be held on Wednesday to discuss your progress.  Your RN Case Designer, television/film set will talk with you frequently to get your input and to update you on team discussions.  Team conferences with you and your family in attendance may also be held.  Expected length of stay: 2 weeks  Overall anticipated outcome: mod/i level  Depending on your progress and recovery, your program may change.  Your RN Case Estate agent will coordinate services and will keep you informed of any changes.  Your RN Sports coach and SW names and contact numbers are listed  below.  The following services may also be recommended but are not provided by the Inpatient Rehabilitation Center:   Driving Evaluations  Home Health Rehabiltiation Services  Outpatient Rehabilitatation [facility]  Vocational Rehabilitation   Arrangements will be made to provide these services after discharge if needed.  Arrangements include referral to agencies that provide these services.  Your insurance has been verified to be: UHC-Medicare Your primary doctor is:  Dr. Darci Needle  Pertinent information will be shared with your doctor and your insurance company.   Social Worker:  Dossie Der, [place] [phone]  Information discussed with and copy given to patient by: Lucy Chris, 02/03/2012, 8:41 AM

## 2012-02-03 NOTE — Patient Care Conference (Signed)
Inpatient RehabilitationTeam Conference Note Date: 02/03/2012   Time: 11:25 AM    Patient Name: Tyler [name]      Medical Record Number: [id number]  Date of Birth: 04-06-1929 Sex: Male         Room/Bed: 4155/4155-01 Payor Info: Payor: Advertising copywriter MEDICARE  Plan: AARP MEDICARE COMPLETE  Product Type: *No Product type*     Admitting Diagnosis: LT CVA  Admit Date/Time:  02/02/2012  3:33 PM Admission Comments: No comment available   Primary Diagnosis:  Stroke Principal Problem: Stroke  Patient Active Problem List   Diagnosis Date Noted  . Stroke 02/02/2012  . Speech abnormality 01/28/2012  . Cerebral embolism with cerebral infarction 01/28/2012  . CVA (cerebral infarction) 01/28/2012  . DM2 (diabetes mellitus, type 2) 01/28/2012  . Hyperlipidemia 01/28/2012  . History of prostate cancer 01/28/2012    Expected Discharge Date: Expected Discharge Date: 02/16/12  Team Members Present: Physician: Dr. Claudette Laws Social Worker Present: Dossie Der, LCSW Nurse Present: Other (comment) Beverely Low Hicks-RN) PT Present: [name], PT;Other (comment) [place] Citizen Cook-PT) OT Present: Leonette Monarch, OT SLP Present: Fae Pippin, SLP     Current Status/Progress Goal Weekly Team Focus  Medical   Right-sided weakness, positive attitude  Initiate treatment plan  PT OT assessments and initial treatments   Bowel/Bladder   continent of bowel and bladder/ use urinal  continent of bowel and bladder  continent of bowel and bladder   Swallow/Nutrition/ Hydration             ADL's             Mobility             Communication   eval pending         Safety/Cognition/ Behavioral Observations  eval pending         Pain   denied pain/ acetaminophen 325-650 mg PRN   free of pain /less or equal to 2  free of pain / less or equal to 2   Skin   No skin issues  free of skin breakdown  free of skin breakdown      *See Interdisciplinary Assessment and Plan and progress notes for  long and short-term goals  Barriers to Discharge: Wife works days    Possible Resolutions to Barriers:  Tried to upgrade to modified independent status    Discharge Planning/Teaching Needs:         Team Discussion:  Eval pending-goals mod/i level, wife works days  Revisions to Treatment Plan:  NOne   Continued Need for Acute Rehabilitation Level of Care: The patient requires daily medical management by a physician with specialized training in physical medicine and rehabilitation for the following conditions: Daily direction of a multidisciplinary physical rehabilitation program to ensure safe treatment while eliciting the highest outcome that is of practical value to the patient.: Yes Daily medical management of patient stability for increased activity during participation in an intensive rehabilitation regime.: Yes Daily analysis of laboratory values and/or radiology reports with any subsequent need for medication adjustment of medical intervention for : Neurological problems  Cythnia Osmun, Lemar Livings 02/05/2012, 1:37 PM

## 2012-02-03 NOTE — Progress Notes (Signed)
Patient information reviewed and entered into eRehab system by Tacora Athanas, RN, CRRN, PPS Coordinator.  Information including medical coding and functional independence measure will be reviewed and updated through discharge.     Per nursing patient was given "Data Collection Information Summary for Patients in Inpatient Rehabilitation Facilities with attached "Privacy Act Statement-Health Care Records" upon admission.  

## 2012-02-03 NOTE — Evaluation (Signed)
Occupational Therapy Assessment and Plan  Patient Details  Name: Tyler [name] MRN: [id number] Date of Birth: 1929-04-09  OT Diagnosis: apraxia, muscle weakness (generalized) and right hemiparesis Rehab Potential: Rehab Potential: Good ELOS: 2 weeks   Today's Date: 02/03/2012 Time: 0930-1030 Time Calculation (min): 60 min  Problem List:  Patient Active Problem List  Diagnosis  . Speech abnormality  . Cerebral embolism with cerebral infarction  . CVA (cerebral infarction)  . DM2 (diabetes mellitus, type 2)  . Hyperlipidemia  . History of prostate cancer  . Stroke    Past Medical History:  Past Medical History  Diagnosis Date  . Hypertension   . Cancer    Past Surgical History:  Past Surgical History  Procedure Date  . Prostate surgery   . Cardiac catheterization     Assessment & Plan Clinical Impression: Patient is a 78 y.o. year old male with recent admission to the hospital with history of hypertension, chronic renal insufficiency with baseline creatinine 1.7. Admitted 01/28/2012 with right sided weakness and slurred speech. MRI of the brain showed acute nonhemorrhagic infarct posterior left lenticular nucleus extending into the posterior aspect of the left corona radiata. MRA of the head with intracranial atherosclerotic type changes as well 1.4 mm aneurysm posterior lateral aspect left internal carotid artery. Echocardiogram with ejection fraction of 70% and grade 1 diastolic dysfunction. Carotid Dopplers with no ICA stenosis. Patient did not receive TPA. Neurology services consulted placed on Plavix therapy for stroke prophylaxis.  Dr. Darrick Penna vascular surgery notified in regards to small aneurysm identified on MRA advised no surgical intervention or further workup at this time. Patient transferred to CIR on 02/02/2012 .    Patient currently requires Min-Mod Assist with basic self-care skills secondary to muscle weakness, impaired timing and sequencing, motor apraxia,  decreased coordination and decreased motor planning, right side neglect and decreased awareness, decreased problem solving and delayed processing.  Prior to hospitalization, patient could complete BADL and IADL with independence.  Patient will benefit from skilled intervention to increase independence with basic self-care skills prior to discharge home with care partner.  Anticipate patient will require intermittent supervision and follow up outpatient.  OT - End of Session Endurance Deficit: Yes Endurance Deficit Description: fatigue after ambulating 34' OT Assessment Rehab Potential: Good Barriers to Discharge: Decreased caregiver support Barriers to Discharge Comments: Wife works first shift during the day. OT Plan OT Frequency: 1-2 X/day, 60-90 minutes Estimated Length of Stay: 2 weeks OT Treatment/Interventions: Balance/vestibular training;Cognitive remediation/compensation;Community reintegration;Discharge planning;Therapeutic Activities;Neuromuscular re-education;Functional mobility training;Self Care/advanced ADL retraining;Functional electrical stimulation;DME/adaptive equipment instruction;Patient/family education;Therapeutic Exercise;UE/LE Strength taining/ROM;UE/LE Coordination activities OT Recommendation Follow Up Recommendations: Outpatient OT Equipment Recommended: Tub/shower bench;3 in 1 bedside comode  OT Evaluation Precautions/Restrictions  Precautions Precautions: Fall Restrictions Weight Bearing Restrictions: No Pain Pain Assessment Pain Assessment: No/denies pain Home Living/Prior Functioning Home Living Lives With: Spouse Available Help at Discharge: Family;Available PRN/intermittently (wife works first shift) Type of Home: House Home Access: Level entry Home Layout: One level (2 steps inside house, no rails) Bathroom Shower/Tub: Tub/shower unit;Curtain Bathroom Toilet: Standard Bathroom Accessibility: Yes How Accessible: Accessible via walker Prior  Function Level of Independence: Independent with basic ADLs;Independent with transfers Able to Take Stairs?: Yes Driving: Yes Vocation: Retired Comments: Agricultural consultant at [employer] Vision/Perception  Vision - History Baseline Vision: Wears glasses only for reading Patient Visual Report: No change from baseline Vision - Assessment Eye Alignment: Within Functional Limits Perception Perception: Impaired Inattention/Neglect: Does not attend to right side of body Body Part Identification: intact  Spatial Orientation: intact Praxis Praxis: Impaired Praxis Impairment Details: Motor planning Praxis-Other Comments: RUE motor planning deficits  Cognition Overall Cognitive Status: Appears within functional limits for tasks assessed Arousal/Alertness: Awake/alert Orientation Level: Oriented X4 Sensation Sensation Light Touch: Appears Intact Stereognosis: Appears Intact Hot/Cold: Appears Intact Proprioception: Appears Intact Coordination Gross Motor Movements are Fluid and Coordinated: No Fine Motor Movements are Fluid and Coordinated: No Coordination and Movement Description: impaired RUE FMC/GMC Finger Nose Finger Test: impaired RUE Motor  Motor Motor - Skilled Clinical Observations: Right Hemiparesis Mobility   Transfers Sit to Stand: 4: Min assist;With armrests;From chair/3-in-1 Sit to Stand Details: Tactile cues for placement;Verbal cues for technique Stand to Sit: 4: Min assist;With armrests;To chair/3-in-1 Stand to Sit Details (indicate cue type and reason): Tactile cues for placement;Verbal cues for technique  Trunk/Postural Assessment  Cervical Assessment Cervical Assessment: Within Functional Limits Thoracic Assessment Thoracic Assessment: Within Functional Limits Lumbar Assessment Lumbar Assessment: Within Functional Limits Postural Control Postural Control: Within Functional Limits  Extremity/Trunk Assessment RUE Assessment RUE Assessment: Exceptions to [facility] RUE AROM  (degrees) RUE Overall AROM Comments: WFL RUE Strength Right Shoulder Flexion: 4/5 Right Shoulder Horizontal ABduction: 4/5 Right Shoulder Horizontal ADduction: 4/5 Right Elbow Flexion: 4/5 Right Elbow Extension: 4/5 Gross Grasp: Impaired LUE Assessment LUE Assessment: Exceptions to WFL LUE AROM (degrees) LUE Overall AROM Comments: WFL LUE Strength Left Shoulder Flexion: 5/5 Left Shoulder Horizontal ABduction: 5/5 Left Shoulder Horizontal ADduction: 5/5 Left Elbow Flexion: 5/5 Left Elbow Extension: 5/5 Gross Grasp: Functional  See FIM for current functional status Refer to Care Plan for Long Term Goals  Skilled Therapeutic Interventions/Progress Updates:  OT eval completed this AM. Patient performed ADL in shower. Patient presents with functional Bil UE strength, poor RUE fine motor and gross motor coordination, right side neglect, delayed motor planning. Patient needed vc's multiple times to use RUE during ADL. Patient will benefit from long handled sponge and elastic shoelaces to increase functional performance during bathing and dressing.   Recommendations for other services: None  Discharge Criteria: Patient will be discharged from OT if patient refuses treatment 3 consecutive times without medical reason, if treatment goals not met, if there is a change in medical status, if patient makes no progress towards goals or if patient is discharged from hospital.  The above assessment, treatment plan, treatment alternatives and goals were discussed and mutually agreed upon: by patient  Limmie Patricia, OTR/L 02/03/2012, 10:53 AM

## 2012-02-03 NOTE — Progress Notes (Signed)
Social Work Assessment and Plan Social Work Assessment and Plan  Patient Details  Name: Tyler [name] MRN: [id number] Date of Birth: January 20, 1929  Today's Date: 02/03/2012  Problem List:  Patient Active Problem List  Diagnosis  . Speech abnormality  . Cerebral embolism with cerebral infarction  . CVA (cerebral infarction)  . DM2 (diabetes mellitus, type 2)  . Hyperlipidemia  . History of prostate cancer  . Stroke   Past Medical History:  Past Medical History  Diagnosis Date  . Hypertension   . Cancer    Past Surgical History:  Past Surgical History  Procedure Date  . Prostate surgery   . Cardiac catheterization    Social History:  reports that he quit smoking about 43 years ago. He does not have any smokeless tobacco history on file. He reports that he does not drink alcohol or use illicit drugs.  Family / Support Systems Marital Status: Married Patient Roles: Spouse;Parent;Volunteer Spouse/Significant Other: [name] [phone]  [phone] Children: Two son's local Other Supports: Friends Anticipated Caregiver: Wife Ability/Limitations of Caregiver: Works daytime 6a-2p Can get someone to be there with him during this time. Caregiver Availability: Evenings only Family Dynamics: Close knit family who are supportive and will do whatever each other needs.  Pt doesn't not want to burden them.  Social History Preferred language: English Religion: Baptist Cultural Background: No issues Education: Product manager Educated Read: Yes Write: Yes Employment Status: Retired Fish farm manager Issues: No issues Guardian/Conservator: None-according to MD pt is capable of making his own decisions   Abuse/Neglect Physical Abuse: Denies Verbal Abuse: Denies Sexual Abuse: Denies Exploitation of patient/patient's resources: Denies Self-Neglect: Denies  Emotional Status Pt's affect, behavior adn adjustment status: Pt is very independent and active and prides himself on  this.  He is hopeful he will do well here.  He has always been self sufficent. Recent Psychosocial Issues: Other medical issues Pyschiatric History: NO issues Substance Abuse History: No issues  Patient / Family Perceptions, Expectations & Goals Pt/Family understanding of illness & functional limitations: Pt is able to explain his stroke and deficits.  He is enocuraged by his progress daily and hopeful it will continue.  He is one to push forward and go from this point. Premorbid pt/family roles/activities: Husband, Father, Grandfather, Home owner, retiree, [employer] member, etc Anticipated changes in roles/activities/participation: Resume Pt/family expectations/goals: Pt states: " I want to be able to do for myself, I am not used to being helped by others."  [employer]: None Premorbid Home Care/DME Agencies: None Transportation available at discharge: [employer] referrals recommended: Support group (specify) ([facility] Support group)  Discharge Planning Living Arrangements: Spouse/significant other Support Systems: Spouse/significant other;Children;Friends/neighbors;Church/faith community Type of Residence: Private residence Insurance Resources: Media planner (specify) Investment banker, operational) Financial Resources: Tree surgeon;Family Support Financial Screen Referred: No Living Expenses: Lives with family Money Management: Patient;Spouse Do you have any problems obtaining your medications?: No Home Management: Both he and wife Patient/Family Preliminary Plans: Return home with wife, who can asssit in the evenings.  Other family or firends can assist during the day while wife works if necessary.  Awaiting team evals. Social Work Anticipated Follow Up Needs: HH/OP;Support Group  Clinical Impression Pleasant gentleman who is extremely motivated to improve and has very good support systems.  Numerous friends in the room when social worker there. Should do well  here.  Lucy Chris 02/03/2012, 11:55 AM

## 2012-02-03 NOTE — Progress Notes (Signed)
Social Work Patient ID: Tyler [name], male   DOB: [birth year], 78 y.o.   MRN: [id number] Met with pt and friends in the room to inform team conference goals-mod/i level and discharge 10/15. He is hopeful he will do well and reach these goals.  Very encouraged by his therapy today.

## 2012-02-03 NOTE — Progress Notes (Signed)
Physical Therapy Session Note  Patient Details  Name: Tyler [name] MRN: [id number] Date of Birth: 19-Jun-1928  Today's Date: 02/03/2012 Time: 4098-1191 Time Calculation (min): 39 min  Short Term Goals: Week 1:  PT Short Term Goal 1 (Week 1): pt will perform all bed mobility with supervision PT Short Term Goal 2 (Week 1): pt will basic transfer L and R with min assist PT Short Term Goal 4 (Week 1): pt will perform gait x 150 with min assist PT Short Term Goal 5 (Week 1): pt will perform dynamic standing activity x 5 minutes with min assist  Skilled Therapeutic Interventions/Progress Updates:  Patient to gym via wheelchair. Patient ambulated with mod HHA for balance and weight shift 100 feet on level tile. Patient ambulated with rolling walker 100 feet with min assist to facilitate right hip control. Patient has decreased hip and knee flexion during swing and tends to drag right foot forward. Patient worked on weight shifting, hip control, and hip/knee flexion by stepping up and down 4 inch step with alternate feet with bilateral upper extremity support. Patient also performed side stepping 20 steps to left and right using rail in hallway working on hip control and coordination of right foot placement. Patient transferred from wheelchair to recliner in room with minimal assist.  Therapy Documentation Precautions:  Precautions Precautions: Fall Restrictions Weight Bearing Restrictions: No  Pain: Pain Assessment Pain Assessment: No/denies pain  Mobility: Transfers Sit to Stand: 4: supervision;With armrests;From chair/3-in-1 Sit to Stand Details: Tactile cues for placement;Verbal cues for technique Stand to Sit: 4: supervision;With armrests;To chair/3-in-1 Stand to Sit Details (indicate cue type and reason): Tactile cues for placement;Verbal cues for technique Stand Pivot Transfers:min assist Locomotion : Ambulation Ambulation/Gait Assistance: 4: Min assist   See FIM for current  functional status  Therapy/Group: Individual Therapy  Arelia Longest M 02/03/2012, 2:04 PM

## 2012-02-03 NOTE — Progress Notes (Signed)
Patient ID: Tyler [name], male   DOB: May 05, 1928, 78 y.o.   MRN: [id number] Tyler [name] is a 78 y.o. right-handed male with history of hypertension, chronic renal insufficiency with baseline creatinine 1.7. Admitted 01/28/2012 with right sided weakness and slurred speech. MRI of the brain showed acute nonhemorrhagic infarct posterior left lenticular nucleus extending into the posterior aspect of the left corona radiata. MRA of the head with intracranial atherosclerotic type changes as well 1.4 mm aneurysm posterior lateral aspect left internal carotid artery. Echocardiogram with ejection fraction of 70% and grade 1 diastolic dysfunction. Carotid Dopplers with no ICA stenosis. Patient did not receive TPA. Neurology services consulted placed on Plavix therapy for stroke prophylaxis.  Dr. Darrick Penna vascular surgery notified in regards to small aneurysm identified on MRA advised no surgical intervention or further workup   Subjective/Complaints: My R foot was blue this am.  No pain no numbness Objective: Vital Signs: Blood pressure 149/69, pulse 89, temperature 97.9 F (36.6 C), temperature source Oral, resp. rate 19, height 5\' 10"  (1.778 m), weight 81.5 kg (179 lb 10.8 oz), SpO2 97.00%. No results found. Results for orders placed during the hospital encounter of 02/02/12 (from the past 72 hour(s))  GLUCOSE, CAPILLARY     Status: Abnormal   Collection Time   02/02/12  4:44 PM      Component Value Range Comment   Glucose-Capillary 124 (*) 70 - 99 mg/dL   GLUCOSE, CAPILLARY     Status: Abnormal   Collection Time   02/02/12  9:42 PM      Component Value Range Comment   Glucose-Capillary 135 (*) 70 - 99 mg/dL   CBC WITH DIFFERENTIAL     Status: Abnormal   Collection Time   02/03/12  6:35 AM      Component Value Range Comment   WBC 7.3  4.0 - 10.5 K/uL    RBC 4.27  4.22 - 5.81 MIL/uL    Hemoglobin 13.3  13.0 - 17.0 g/dL    HCT 40.9 (*) 81.1 - 52.0 %    MCV 90.9  78.0 - 100.0 fL    MCH 31.1  26.0 -  34.0 pg    MCHC 34.3  30.0 - 36.0 g/dL    RDW 91.4  78.2 - 95.6 %    Platelets 158  150 - 400 K/uL    Neutrophils Relative 63  43 - 77 %    Neutro Abs 4.6  1.7 - 7.7 K/uL    Lymphocytes Relative 22  12 - 46 %    Lymphs Abs 1.6  0.7 - 4.0 K/uL    Monocytes Relative 7  3 - 12 %    Monocytes Absolute 0.5  0.1 - 1.0 K/uL    Eosinophils Relative 8 (*) 0 - 5 %    Eosinophils Absolute 0.6  0.0 - 0.7 K/uL    Basophils Relative 0  0 - 1 %    Basophils Absolute 0.0  0.0 - 0.1 K/uL   COMPREHENSIVE METABOLIC PANEL     Status: Abnormal   Collection Time   02/03/12  6:35 AM      Component Value Range Comment   Sodium 137  135 - 145 mEq/L    Potassium 4.2  3.5 - 5.1 mEq/L    Chloride 103  96 - 112 mEq/L    CO2 22  19 - 32 mEq/L    Glucose, Bld 143 (*) 70 - 99 mg/dL    BUN 32 (*) 6 -  23 mg/dL    Creatinine, Ser 1.61 (*) 0.50 - 1.35 mg/dL    Calcium 9.6  8.4 - 09.6 mg/dL    Total Protein 6.4  6.0 - 8.3 g/dL    Albumin 3.5  3.5 - 5.2 g/dL    AST 13  0 - 37 U/L    ALT 16  0 - 53 U/L    Alkaline Phosphatase 102  39 - 117 U/L    Total Bilirubin 0.7  0.3 - 1.2 mg/dL    GFR calc non Af Amer 38 (*) >90 mL/min    GFR calc Af Amer 44 (*) >90 mL/min   GLUCOSE, CAPILLARY     Status: Abnormal   Collection Time   02/03/12  7:12 AM      Component Value Range Comment   Glucose-Capillary 130 (*) 70 - 99 mg/dL    Comment 1 Notify RN        HEENT: normal Cardio: RRR Resp: CTA B/L GI: BS positive Extremity:  No Edema Skin:   Other R great toe onychogryphosis Neuro: Alert/Oriented, Cranial Nerve Abnormalities R central 7, Normal Sensory, Abnormal Motor 4-/5 in RUE, 4/5 in RLE, Abnormal FMC Ataxic/ dec FMC and Dysarthric Musc/Skel:  Other no foot or ankle deformities Pedal and post tib pulses normal, skin warm and dry   Assessment/Plan: 1. Functional deficits secondary to L basal ganglia and corona radiata infarct, RHP, R fine motor and coordination deficits which require 3+ hours per day of  interdisciplinary therapy in a comprehensive inpatient rehab setting. Physiatrist is providing close team supervision and 24 hour management of active medical problems listed below. Physiatrist and rehab team continue to assess barriers to discharge/monitor patient progress toward functional and medical goals. FIM:                FIM - Locomotion: Wheelchair Distance: 25 FIM - Locomotion: Ambulation Ambulation/Gait Assistance: 3: Mod assist  Comprehension Comprehension Mode: Auditory Comprehension: 5-Understands complex 90% of the time/Cues < 10% of the time  Expression Expression Mode: Verbal Expression: 5-Expresses complex 90% of the time/cues < 10% of the time  Social Interaction Social Interaction: 5-Interacts appropriately 90% of the time - Needs monitoring or encouragement for participation or interaction.  Problem Solving Problem Solving: 5-Solves complex 90% of the time/cues < 10% of the time  Memory Memory: 5-Recognizes or recalls 90% of the time/requires cueing < 10% of the time  Medical Problem List and Plan:  1. Thrombotic left lenticular nucleus-corona radiata infarct  2. DVT Prophylaxis/Anticoagulation: SCDs. Monitor for signs of DVT  3. Mood: Pt is generally up beat and motivated. Team to provide egosupport as appropriate  4. Neuropsych: This patient is capable of making decisions on his/her own behalf.  5. Hypertension. Lopressor 12.5 mg twice a day, Cozaar 50 mg daily. Monitor with increased mobility  6. Diabetes mellitus. Hemoglobin A1c 6.7. Amaryl 4 mg daily. Check blood sugars a.c. and at bedtime  7. Hyperlipidemia. Lovaza and Zocor  8. Chronic renal insufficiency. Baseline creatinine 1.6. Followup labs and encourage adequate fluid intake  LOS (Days) 1 A FACE TO FACE EVALUATION WAS PERFORMED  KIRSTEINS,ANDREW E 02/03/2012, 9:41 AM

## 2012-02-04 ENCOUNTER — Inpatient Hospital Stay (HOSPITAL_COMMUNITY): Payer: Medicare Other | Admitting: Occupational Therapy

## 2012-02-04 ENCOUNTER — Inpatient Hospital Stay (HOSPITAL_COMMUNITY): Payer: Medicare Other

## 2012-02-04 DIAGNOSIS — Z5189 Encounter for other specified aftercare: Secondary | ICD-10-CM

## 2012-02-04 DIAGNOSIS — I6330 Cerebral infarction due to thrombosis of unspecified cerebral artery: Secondary | ICD-10-CM

## 2012-02-04 LAB — GLUCOSE, CAPILLARY: Glucose-Capillary: 144 mg/dL — ABNORMAL HIGH (ref 70–99)

## 2012-02-04 NOTE — Progress Notes (Signed)
Occupational Therapy Session Note  Patient Details  Name: YOANDRI CONGROVE MRN: [id number] Date of Birth: [birth year]  Today's Date: 02/04/2012 Time: 0930-1030 Time Calculation (min): 60 min  Short Term Goals: Week 1:  OT Short Term Goal 1 (Week 1): Patient will be instructed on HEP. OT Short Term Goal 2 (Week 1): Patient will increase awareness of RUE with use of compensatory techniques and Min verbal cues. OT Short Term Goal 3 (Week 1): Patient will increase RUE FMC to increase functional performance during grooming tasks.  Skilled Therapeutic Interventions/Progress Updates:  ADL re-training session performed in shower this AM. Session with focus on ADL performance, functional transfers, task sequencing, attention to right side, and safety awareness. Patient needed intermittent vc's to attend to right arm and allow his right arm to perform task first before disregarding it and doing everything with his left hand. Patient was given elastic shoelaces to increase functional performance during LB dressing. Educated patient on use. Pt verbalized understanding.   Therapy Documentation Precautions:  Precautions Precautions: Fall Restrictions Weight Bearing Restrictions: No Pain: Pain Assessment Pain Assessment: No/denies pain  See FIM for current functional status  Therapy/Group: Individual Therapy  Limmie Patricia, OTR/L 02/04/2012, 11:41 AM

## 2012-02-04 NOTE — Progress Notes (Signed)
Speech Language Pathology Daily Session Note  Patient Details  Name: Tyler [name] MRN: [id number] Date of Birth: 1928-09-09  Today's Date: 02/04/2012 Time: 8295-6213 Time Calculation (min): 45 min  Short Term Goals: Week 1: SLP Short Term Goal 1 (Week 1): Pt will improve speech intelligibility by completing 10 reps of oral motor exercises for increased ROM, strength, and coordination for functional verbal communication SLP Short Term Goal 1 - Progress (Week 1): Progressing toward goal SLP Short Term Goal 2 (Week 1): Pt will increase functional speech production for verbalizing and using compensatory strategies to 80% accuracy given mod cues. SLP Short Term Goal 2 - Progress (Week 1): Progressing toward goal SLP Short Term Goal 3 (Week 1): Pt will improve functional thought organization and reasoning skills by completing tasks of increasing complexity with 75% accuracy given mod cues. SLP Short Term Goal 3 - Progress (Week 1): Progressing toward goal SLP Short Term Goal 4 (Week 1): Pt will increase functional cognitive skills for STM recall of information at sentence level after 5 minute time lapse to 80% accuracy given min cues. SLP Short Term Goal 4 - Progress (Week 1): Progressing toward goal  Skilled Therapeutic Interventions: Treatment today focused on oral motor strengthening, increasing speech intelligibility, and recall. Pt able to perform oral motor strengthening exercises with min cues for technique.  Reviewed strategies to increase intelligibility of speech (well-fitting dentures, decreased rate, overarticulation, open mouth, good posture, adequate breath support). Written information provided. Pt able to recall specific information after 5 and 10 minute delays, and recalled specific information discussed yesterday during evaluation. Pt utilizes notes to improve recall (needs to contact wife to bring in denture cream).  Recommend consideration of NMES to right face to improve muscle  tone and strength of contraction.    FIM:  Comprehension Comprehension Mode: Auditory;Visual Comprehension: 6-Follows complex conversation/direction: With extra time/assistive device Expression Expression Mode: Verbal Expression: 5-Expresses complex 90% of the time/cues < 10% of the time Social Interaction Social Interaction: 6-Interacts appropriately with others with medication or extra time (anti-anxiety, antidepressant). Problem Solving Problem Solving: 5-Solves basic 90% of the time/requires cueing < 10% of the time Memory Memory Assistive Devices: Other (Comment) (written reminders) Memory: 5-Recognizes or recalls 90% of the time/requires cueing < 10% of the time FIM - Eating Eating Activity: 0: Activity did not occur  Pain Pain Assessment Pain Assessment: No/denies pain  Therapy/Group: Individual Therapy  Sanaa Zilberman B. [place], [facility], CCC-SLP [phone]   Leigh Aurora 02/04/2012, 11:07 AM

## 2012-02-04 NOTE — Progress Notes (Signed)
Patient ID: Tyler [name], male   DOB: 07/23/1928, 78 y.o.   MRN: [id number] Tyler [name] is a 78 y.o. right-handed male with history of hypertension, chronic renal insufficiency with baseline creatinine 1.7. Admitted 01/28/2012 with right sided weakness and slurred speech. MRI of the brain showed acute nonhemorrhagic infarct posterior left lenticular nucleus extending into the posterior aspect of the left corona radiata. MRA of the head with intracranial atherosclerotic type changes as well 1.4 mm aneurysm posterior lateral aspect left internal carotid artery. Echocardiogram with ejection fraction of 70% and grade 1 diastolic dysfunction. Carotid Dopplers with no ICA stenosis. Patient did not receive TPA. Neurology services consulted placed on Plavix therapy for stroke prophylaxis.  Dr. Darrick Penna vascular surgery notified in regards to small aneurysm identified on MRA advised no surgical intervention or further workup   Subjective/Complaints: Am I on Plavix?-we reviewed meds.  No pain no numbness Objective: Vital Signs: Blood pressure 136/69, pulse 56, temperature 98.4 F (36.9 C), temperature source Oral, resp. rate 17, height 5\' 10"  (1.778 m), weight 81.5 kg (179 lb 10.8 oz), SpO2 97.00%. No results found. Results for orders placed during the hospital encounter of 02/02/12 (from the past 72 hour(s))  GLUCOSE, CAPILLARY     Status: Abnormal   Collection Time   02/02/12  4:44 PM      Component Value Range Comment   Glucose-Capillary 124 (*) 70 - 99 mg/dL   GLUCOSE, CAPILLARY     Status: Abnormal   Collection Time   02/02/12  9:42 PM      Component Value Range Comment   Glucose-Capillary 135 (*) 70 - 99 mg/dL   CBC WITH DIFFERENTIAL     Status: Abnormal   Collection Time   02/03/12  6:35 AM      Component Value Range Comment   WBC 7.3  4.0 - 10.5 K/uL    RBC 4.27  4.22 - 5.81 MIL/uL    Hemoglobin 13.3  13.0 - 17.0 g/dL    HCT 40.9 (*) 81.1 - 52.0 %    MCV 90.9  78.0 - 100.0 fL    MCH 31.1   26.0 - 34.0 pg    MCHC 34.3  30.0 - 36.0 g/dL    RDW 91.4  78.2 - 95.6 %    Platelets 158  150 - 400 K/uL    Neutrophils Relative 63  43 - 77 %    Neutro Abs 4.6  1.7 - 7.7 K/uL    Lymphocytes Relative 22  12 - 46 %    Lymphs Abs 1.6  0.7 - 4.0 K/uL    Monocytes Relative 7  3 - 12 %    Monocytes Absolute 0.5  0.1 - 1.0 K/uL    Eosinophils Relative 8 (*) 0 - 5 %    Eosinophils Absolute 0.6  0.0 - 0.7 K/uL    Basophils Relative 0  0 - 1 %    Basophils Absolute 0.0  0.0 - 0.1 K/uL   COMPREHENSIVE METABOLIC PANEL     Status: Abnormal   Collection Time   02/03/12  6:35 AM      Component Value Range Comment   Sodium 137  135 - 145 mEq/L    Potassium 4.2  3.5 - 5.1 mEq/L    Chloride 103  96 - 112 mEq/L    CO2 22  19 - 32 mEq/L    Glucose, Bld 143 (*) 70 - 99 mg/dL    BUN 32 (*) 6 - 23  mg/dL    Creatinine, Ser 4.01 (*) 0.50 - 1.35 mg/dL    Calcium 9.6  8.4 - 02.7 mg/dL    Total Protein 6.4  6.0 - 8.3 g/dL    Albumin 3.5  3.5 - 5.2 g/dL    AST 13  0 - 37 U/L    ALT 16  0 - 53 U/L    Alkaline Phosphatase 102  39 - 117 U/L    Total Bilirubin 0.7  0.3 - 1.2 mg/dL    GFR calc non Af Amer 38 (*) >90 mL/min    GFR calc Af Amer 44 (*) >90 mL/min   GLUCOSE, CAPILLARY     Status: Abnormal   Collection Time   02/03/12  7:12 AM      Component Value Range Comment   Glucose-Capillary 130 (*) 70 - 99 mg/dL    Comment 1 Notify RN     GLUCOSE, CAPILLARY     Status: Abnormal   Collection Time   02/03/12 11:41 AM      Component Value Range Comment   Glucose-Capillary 147 (*) 70 - 99 mg/dL    Comment 1 Notify RN     GLUCOSE, CAPILLARY     Status: Abnormal   Collection Time   02/03/12  4:31 PM      Component Value Range Comment   Glucose-Capillary 122 (*) 70 - 99 mg/dL    Comment 1 Notify RN      Comment 2 Documented in Chart     GLUCOSE, CAPILLARY     Status: Abnormal   Collection Time   02/03/12  9:18 PM      Component Value Range Comment   Glucose-Capillary 208 (*) 70 - 99 mg/dL       HEENT: normal Cardio: RRR Resp: CTA B/L GI: BS positive Extremity:  No Edema Skin:   Other R great toe onychogryphosis Neuro: Alert/Oriented, Cranial Nerve Abnormalities R central 7, Normal Sensory, Abnormal Motor 4-/5 in RUE, 4/5 in RLE, Abnormal FMC Ataxic/ dec FMC and Dysarthric Musc/Skel:  Other no foot or ankle deformities Pedal and post tib pulses normal, skin warm and dry   Assessment/Plan: 1. Functional deficits secondary to L basal ganglia and corona radiata infarct, RHP, R fine motor and coordination deficits which require 3+ hours per day of interdisciplinary therapy in a comprehensive inpatient rehab setting. Physiatrist is providing close team supervision and 24 hour management of active medical problems listed below. Physiatrist and rehab team continue to assess barriers to discharge/monitor patient progress toward functional and medical goals. FIM: FIM - Bathing Bathing Steps Patient Completed: Chest;Right Arm;Left Arm;Abdomen;Front perineal area;Buttocks;Right upper leg;Left upper leg;Right lower leg (including foot);Left lower leg (including foot) Bathing: 4: Min-Patient completes 8-9 3f 10 parts or 75+ percent  FIM - Upper Body Dressing/Undressing Upper body dressing/undressing steps patient completed: Thread/unthread right sleeve of pullover shirt/dresss;Thread/unthread left sleeve of pullover shirt/dress;Put head through opening of pull over shirt/dress;Pull shirt over trunk Upper body dressing/undressing: 4: Min-Patient completed 75 plus % of tasks FIM - Lower Body Dressing/Undressing Lower body dressing/undressing steps patient completed: Thread/unthread right underwear leg;Thread/unthread left underwear leg;Pull underwear up/down;Thread/unthread right pants leg;Thread/unthread left pants leg;Pull pants up/down;Don/Doff right sock;Don/Doff left sock;Don/Doff right shoe;Don/Doff left shoe;Fasten/unfasten right shoe;Fasten/unfasten left shoe Lower body  dressing/undressing: 3: Mod-Patient completed 50-74% of tasks  FIM - Toileting Toileting steps completed by patient: Adjust clothing prior to toileting;Performs perineal hygiene;Adjust clothing after toileting Toileting: 2: Max-Patient completed 1 of 3 steps  FIM - Archivist  Transfers: 3-To toilet/BSC: Mod A (lift or lower assist);3-From toilet/BSC: Mod A (lift or lower assist)  FIM - Bed/Chair Transfer Bed/Chair Transfer: 4: Bed > Chair or W/C: Min A (steadying Pt. > 75%);4: Chair or W/C > Bed: Min A (steadying Pt. > 75%)  FIM - Locomotion: Wheelchair Distance: 25 Locomotion: Wheelchair: 1: Travels less than 50 ft with moderate assistance (Pt: 50 - 74%) FIM - Locomotion: Ambulation Locomotion: Ambulation Assistive Devices: Designer, industrial/product Ambulation/Gait Assistance: 4: Min assist Locomotion: Ambulation: 2: Travels 50 - 149 ft with minimal assistance (Pt.>75%)  Comprehension Comprehension Mode: Auditory Comprehension: 5-Understands complex 90% of the time/Cues < 10% of the time  Expression Expression Mode: Verbal Expression: 6-Expresses complex ideas: With extra time/assistive device  Social Interaction Social Interaction: 6-Interacts appropriately with others with medication or extra time (anti-anxiety, antidepressant).  Problem Solving Problem Solving: 5-Solves complex 90% of the time/cues < 10% of the time  Memory Memory: 4-Recognizes or recalls 75 - 89% of the time/requires cueing 10 - 24% of the time  Medical Problem List and Plan:  1. Thrombotic left lenticular nucleus-corona radiata infarct  2. DVT Prophylaxis/Anticoagulation: SCDs. Monitor for signs of DVT  3. Mood: Pt is generally up beat and motivated. Team to provide egosupport as appropriate  4. Neuropsych: This patient is capable of making decisions on his/her own behalf.  5. Hypertension. Lopressor 12.5 mg twice a day, Cozaar 50 mg daily. Monitor with increased mobility  6. Diabetes mellitus.  Hemoglobin A1c 6.7. Amaryl 4 mg daily. Check blood sugars a.c. and at bedtime  7. Hyperlipidemia. Lovaza and Zocor  8. Chronic renal insufficiency. Baseline creatinine 1.6. Followup labs and encourage adequate fluid intake  LOS (Days) 2 A FACE TO FACE EVALUATION WAS PERFORMED  Tyler [name] E 02/04/2012, 8:40 AM

## 2012-02-04 NOTE — Progress Notes (Signed)
Physical Therapy Session Note  Patient Details  Name: Tyler [name] MRN: [id number] Date of Birth: 1928-11-30  Today's Date: 02/04/2012 Time: 1105-1205 Time Calculation (min): 60 min  Short Term Goals: Week 1:  PT Short Term Goal 1 (Week 1): pt will perform all bed mobility with supervision PT Short Term Goal 2 (Week 1): pt will basic transfer L and R with min assist PT Short Term Goal 4 (Week 1): pt will perform gait x 150 with min assist PT Short Term Goal 5 (Week 1): pt will perform dynamic standing activity x 5 minutes with min assist  Skilled Therapeutic Interventions/Progress Updates:   Gait training with RW x 87' with min assist for occasional scuffing of R foot, VCs for upright posture and forward gaze; neuromuscular re-education for exaggerated R hip flexion to clear foot.  Neuromuscular re-education via VCs, demo, tactile :  - in standingR stance phase components with L toe taps on 4" high stool x 10, R swing phase components with R toe taps x 10,, toes up/down to facilitate balance reactions/ particularly ankle strategy.  - in sitting, squeezing ball between knees to decrease bil hip ER with resulting narrow BOS, 20 x 1   - in partial standing with ball between knees for bil hip IR and adduction strengthening during R and L reaching out of base of support  - in standing on compliant surface, toes up/down with improved recruitment of bil ankle Df for ankle strategy postural reaction by end of session  Gait returning to room, x 50' with RW with close superviison/min assist, x 150' with HHA up to mod assist during divided attention task, talking while walking and especially greeting other staff in hall.  With each greeting, pt's R foot scuffed on floor, and he was at risk of falling.     Therapy Documentation Precautions:  Precautions Precautions: Fall Restrictions Weight Bearing Restrictions: No      See FIM for current functional status  Therapy/Group: Individual  Therapy  Michaeal Davis 02/04/2012, 12:18 PM

## 2012-02-04 NOTE — Progress Notes (Signed)
Occupational Therapy Session Note  Patient Details  Name: Tyler [name] MRN: [id number] Date of Birth: [birth year]  Today's Date: 02/04/2012 Time: 5621-3086 Time Calculation (min): 40 min  Short Term Goals: Week 1:  OT Short Term Goal 1 (Week 1): Patient will be instructed on HEP. OT Short Term Goal 2 (Week 1): Patient will increase awareness of RUE with use of compensatory techniques and Min verbal cues. OT Short Term Goal 3 (Week 1): Patient will increase RUE FMC to increase functional performance during grooming tasks.  Precautions:  Precautions Precautions: Fall Restrictions Weight Bearing Restrictions: No Pain: Denies pain  Skilled Therapeutic Interventions/Progress Updates:  Neuro muscular reeducation to RUE & RLE to improve independence with BADL.  Focused on sit><stand and anterior weight shift toward weak right side to force use of RLE during transitions to standing and to sitting. See below for additional activities.  Exercises: Hand Activities Pick Up, Palm, Put Down: Right;5 reps;Standing Stack Objects: Right;5 reps;Standing Manipulate and Spin Cup: Right;10 reps;Standing Open and Close Containers: Right;Both;5 reps;Standing Other Treatments: Treatments Neuromuscular Facilitation: Right;Upper Extremity;Forced use;Activity to increase coordination;Activity to increase motor control;Activity to increase timing and sequencing;Activity to increase grading;Activity to increase sustained activation;Activity to increase lateral weight shifting;Lower Extremity  See FIM for current functional status  Therapy/Group: Individual Therapy  Sinead Hockman 02/04/2012, 4:12 PM

## 2012-02-05 ENCOUNTER — Inpatient Hospital Stay (HOSPITAL_COMMUNITY): Payer: Medicare Other | Admitting: Speech Pathology

## 2012-02-05 ENCOUNTER — Inpatient Hospital Stay (HOSPITAL_COMMUNITY): Payer: Medicare Other

## 2012-02-05 ENCOUNTER — Inpatient Hospital Stay (HOSPITAL_COMMUNITY): Payer: Medicare Other | Admitting: Occupational Therapy

## 2012-02-05 LAB — GLUCOSE, CAPILLARY
Glucose-Capillary: 137 mg/dL — ABNORMAL HIGH (ref 70–99)
Glucose-Capillary: 173 mg/dL — ABNORMAL HIGH (ref 70–99)
Glucose-Capillary: 165 mg/dL — ABNORMAL HIGH (ref 70–99)

## 2012-02-05 NOTE — Progress Notes (Signed)
Occupational Therapy Session Note  Patient Details  Name: Tyler [name] MRN: [id number] Date of Birth: 29-Dec-1928  Today's Date: 02/05/2012 Time: 0730-0830 Time Calculation (min): 60 min  Short Term Goals: Week 1:  OT Short Term Goal 1 (Week 1): Patient will be instructed on HEP. OT Short Term Goal 2 (Week 1): Patient will increase awareness of RUE with use of compensatory techniques and Min verbal cues. OT Short Term Goal 3 (Week 1): Patient will increase RUE FMC to increase functional performance during grooming tasks.  Skilled Therapeutic Interventions/Progress Updates:  Patient seen for self-feeding with breakfast. Patient required set-up of tray. Pt having difficulty with holding utensils with right hand. Was given built up foam utensil holders. Pt was able to feed self with increased time and vc's for technique. Pt was rest breaks and alternated arms when right arm became fatigued. Pt encouraged to always begin feeding with right hand.  Pt dressed at sink side with Supervision. Pt used right hand more and only required vc's <10% of the time to attend to it. Pt had increased dynamic standing balance while doffing/donning t-shirt in standing without upper extremity support.  Therapy Documentation Precautions:  Precautions Precautions: Fall Restrictions Weight Bearing Restrictions: No Pain: Pain Assessment Pain Assessment: No/denies pain  See FIM for current functional status  Therapy/Group: Individual Therapy  Limmie Patricia, OTR/L 02/05/2012, 8:25 AM

## 2012-02-05 NOTE — Progress Notes (Addendum)
Physical Therapy Session Note  Patient Details  Name: Tyler [name] MRN: [id number] Date of Birth: [birth year]  Today's Date: 02/05/2012 Time: 0905-1000 Time Calculation (min): 55 min  Short Term Goals: Week 1:  PT Short Term Goal 1 (Week 1): pt will perform all bed mobility with supervision PT Short Term Goal 2 (Week 1): pt will basic transfer L and R with min assist PT Short Term Goal 4 (Week 1): pt will perform gait x 150 with min assist PT Short Term Goal 5 (Week 1): pt will perform dynamic standing activity x 5 minutes with min assist  Skilled Therapeutic Interventions/Progress Updates:  neuromuscular re-education:  - for R hip IR/adduction to decrease toe out/ narrow BOS, via forced use squeezing ball between knees in sitting, 20 x 1 with isometric contractions - closed chain forced use L and R hip abduction in standing 10 x 1 each; repetitive stepping forward/backward 5x3 for hip stability and hip flexion - trunk rotation with visual feedback and VCs, holding green therapy ball at chest height, 5 x 1 in standing and 5 x 1 in mini squat position.   Advanced gait training with HHA:  backwards, through gait ladder on floor to = step lengths, sidestepping L and R. Pt with occasional LOB R or backwards.  Gait up/down 5 steps x 2 with bil rails, min assist for alternating feet, supervision for step to pattern.    Gait with RW x 200' with min assist for occasional scuffing R foot, VCs and tactile cues for upright trunk, wider BOS.  Pt has significant bil genu varus so probably ambulated with narrow BOS prior to CVA.     Therapy Documentation Precautions:  Precautions Precautions: Fall Restrictions Weight Bearing Restrictions: No  Pain: Pain Assessment Pain Assessment: No/denies pain     See FIM for current functional status  Therapy/Group: Individual Therapy  Devonne Kitchen 02/05/2012, 2:35 PM

## 2012-02-05 NOTE — Progress Notes (Signed)
Speech Language Pathology Daily Session Note  Patient Details  Name: Tyler [name] MRN: [id number] Date of Birth: 08-23-1928  Today's Date: 02/05/2012 Time: 1015-1055 Time Calculation (min): 40 min  Short Term Goals: Week 1: SLP Short Term Goal 1 (Week 1): Pt will improve speech intelligibility by completing 10 reps of oral motor exercises for increased ROM, strength, and coordination for functional verbal communication SLP Short Term Goal 1 - Progress (Week 1): Progressing toward goal SLP Short Term Goal 2 (Week 1): Pt will increase functional speech production for verbalizing and using compensatory strategies to 80% accuracy given mod cues. SLP Short Term Goal 2 - Progress (Week 1): Progressing toward goal SLP Short Term Goal 3 (Week 1): Pt will improve functional thought organization and reasoning skills by completing tasks of increasing complexity with 75% accuracy given mod cues. SLP Short Term Goal 3 - Progress (Week 1): Progressing toward goal SLP Short Term Goal 4 (Week 1): Pt will increase functional cognitive skills for STM recall of information at sentence level after 5 minute time lapse to 80% accuracy given min cues. SLP Short Term Goal 4 - Progress (Week 1): Progressing toward goal  Skilled Therapeutic Interventions: Treatment session focused on addressing speech intelligibility and cognition during self-care tasks.  SLP facilitated session with supervision level verbal cues to accurately return demonstration of oral motor strengthening exercises.  SLP also facilitated session with mod assist verbal cues to utilize written aid to recall and carryout ordering food via phone with min assist verbal cues to carryover use of speech intelligibility strategies.  SLP also discussed recommend of NMES to right face to improve muscle tone and strength of contraction in a group setting on Monday and patient in agreement.    FIM:  Comprehension Comprehension Mode: Auditory Comprehension:  5-Understands complex 90% of the time/Cues < 10% of the time Expression Expression Mode: Verbal Expression: 5-Expresses basic 90% of the time/requires cueing < 10% of the time. Social Interaction Social Interaction: 6-Interacts appropriately with others with medication or extra time (anti-anxiety, antidepressant). Problem Solving Problem Solving: 3-Solves basic 50 - 74% of the time/requires cueing 25 - 49% of the time Memory Memory: 4-Recognizes or recalls 75 - 89% of the time/requires cueing 10 - 24% of the time FIM - Eating Eating Activity: 5: Set-up assist for open containers  Pain Pain Assessment Pain Assessment: No/denies pain  Therapy/Group: Individual Therapy  Charlane Ferretti., CCC-SLP [phone]  [name] 02/05/2012, 11:25 AM

## 2012-02-05 NOTE — Progress Notes (Signed)
Occupational Therapy Session Note  Patient Details  Name: Tyler [name] MRN: [id number] Date of Birth: 06/10/1928  Today's Date: 02/05/2012 Time: 6578-4696 Time Calculation (min): 30 min  Short Term Goals: Week 1:  OT Short Term Goal 1 (Week 1): Patient will be instructed on HEP. OT Short Term Goal 2 (Week 1): Patient will increase awareness of RUE with use of compensatory techniques and Min verbal cues. OT Short Term Goal 3 (Week 1): Patient will increase RUE FMC to increase functional performance during grooming tasks.  Skilled Therapeutic Interventions/Progress Updates:  Therapeutic activity to include ambulate with left HHA to therapy kitchen while patient carried this clinician's clipboard with RUE.  Forced use of RUE for all tasks during activity to include open cabinets and retrieve items, turn on water and fill glass ~half full and carried class with RUE to the Day Room with only spilling small amount of water one time-which he cleaned up with paper towel and min assist for balance. After short rest break, ambulated back to therapy kitchen carrying cup. Then back to his hospital room.  Patient encouraged to pick up the pace while ambulating and he had ~5 minimal LOB and was able to self correct with HHA.  Therapy Documentation Precautions:  Precautions Precautions: Fall Restrictions Weight Bearing Restrictions: No Pain: Denies pain  Therapy/Group: Individual Therapy  Janasha Barkalow 02/05/2012, 4:57 PM

## 2012-02-05 NOTE — Progress Notes (Signed)
Patient ID: Tyler [name], male   DOB: 1928/07/23, 78 y.o.   MRN: [id number] Tyler [name] is a 78 y.o. right-handed male with history of hypertension, chronic renal insufficiency with baseline creatinine 1.7. Admitted 01/28/2012 with right sided weakness and slurred speech. MRI of the brain showed acute nonhemorrhagic infarct posterior left lenticular nucleus extending into the posterior aspect of the left corona radiata. MRA of the head with intracranial atherosclerotic type changes as well 1.4 mm aneurysm posterior lateral aspect left internal carotid artery. Echocardiogram with ejection fraction of 70% and grade 1 diastolic dysfunction. Carotid Dopplers with no ICA stenosis. Patient did not receive TPA. Neurology services consulted placed on Plavix therapy for stroke prophylaxis.  Dr. Darrick Penna vascular surgery notified in regards to small aneurysm identified on MRA advised no surgical intervention or further workup   Subjective/Complaints: What causes strokes?  We discussed this today including his risk factors.  Also discussed diabetic management Objective: Vital Signs: Blood pressure 145/47, pulse 60, temperature 97.6 F (36.4 C), temperature source Oral, resp. rate 16, height 5\' 10"  (1.778 m), weight 81.5 kg (179 lb 10.8 oz), SpO2 99.00%. No results found. Results for orders placed during the hospital encounter of 02/02/12 (from the past 72 hour(s))  GLUCOSE, CAPILLARY     Status: Abnormal   Collection Time   02/02/12  4:44 PM      Component Value Range Comment   Glucose-Capillary 124 (*) 70 - 99 mg/dL   GLUCOSE, CAPILLARY     Status: Abnormal   Collection Time   02/02/12  9:42 PM      Component Value Range Comment   Glucose-Capillary 135 (*) 70 - 99 mg/dL   CBC WITH DIFFERENTIAL     Status: Abnormal   Collection Time   02/03/12  6:35 AM      Component Value Range Comment   WBC 7.3  4.0 - 10.5 K/uL    RBC 4.27  4.22 - 5.81 MIL/uL    Hemoglobin 13.3  13.0 - 17.0 g/dL    HCT 40.9 (*) 81.1  - 52.0 %    MCV 90.9  78.0 - 100.0 fL    MCH 31.1  26.0 - 34.0 pg    MCHC 34.3  30.0 - 36.0 g/dL    RDW 91.4  78.2 - 95.6 %    Platelets 158  150 - 400 K/uL    Neutrophils Relative 63  43 - 77 %    Neutro Abs 4.6  1.7 - 7.7 K/uL    Lymphocytes Relative 22  12 - 46 %    Lymphs Abs 1.6  0.7 - 4.0 K/uL    Monocytes Relative 7  3 - 12 %    Monocytes Absolute 0.5  0.1 - 1.0 K/uL    Eosinophils Relative 8 (*) 0 - 5 %    Eosinophils Absolute 0.6  0.0 - 0.7 K/uL    Basophils Relative 0  0 - 1 %    Basophils Absolute 0.0  0.0 - 0.1 K/uL   COMPREHENSIVE METABOLIC PANEL     Status: Abnormal   Collection Time   02/03/12  6:35 AM      Component Value Range Comment   Sodium 137  135 - 145 mEq/L    Potassium 4.2  3.5 - 5.1 mEq/L    Chloride 103  96 - 112 mEq/L    CO2 22  19 - 32 mEq/L    Glucose, Bld 143 (*) 70 - 99 mg/dL  BUN 32 (*) 6 - 23 mg/dL    Creatinine, Ser 9.56 (*) 0.50 - 1.35 mg/dL    Calcium 9.6  8.4 - 21.3 mg/dL    Total Protein 6.4  6.0 - 8.3 g/dL    Albumin 3.5  3.5 - 5.2 g/dL    AST 13  0 - 37 U/L    ALT 16  0 - 53 U/L    Alkaline Phosphatase 102  39 - 117 U/L    Total Bilirubin 0.7  0.3 - 1.2 mg/dL    GFR calc non Af Amer 38 (*) >90 mL/min    GFR calc Af Amer 44 (*) >90 mL/min   GLUCOSE, CAPILLARY     Status: Abnormal   Collection Time   02/03/12  7:12 AM      Component Value Range Comment   Glucose-Capillary 130 (*) 70 - 99 mg/dL    Comment 1 Notify RN     GLUCOSE, CAPILLARY     Status: Abnormal   Collection Time   02/03/12 11:41 AM      Component Value Range Comment   Glucose-Capillary 147 (*) 70 - 99 mg/dL    Comment 1 Notify RN     GLUCOSE, CAPILLARY     Status: Abnormal   Collection Time   02/03/12  4:31 PM      Component Value Range Comment   Glucose-Capillary 122 (*) 70 - 99 mg/dL    Comment 1 Notify RN      Comment 2 Documented in Chart     GLUCOSE, CAPILLARY     Status: Abnormal   Collection Time   02/03/12  9:18 PM      Component Value Range Comment     Glucose-Capillary 208 (*) 70 - 99 mg/dL   GLUCOSE, CAPILLARY     Status: Abnormal   Collection Time   02/04/12  6:25 AM      Component Value Range Comment   Glucose-Capillary 142 (*) 70 - 99 mg/dL   GLUCOSE, CAPILLARY     Status: Abnormal   Collection Time   02/04/12  7:12 AM      Component Value Range Comment   Glucose-Capillary 134 (*) 70 - 99 mg/dL    Comment 1 Notify RN     GLUCOSE, CAPILLARY     Status: Abnormal   Collection Time   02/04/12 12:10 PM      Component Value Range Comment   Glucose-Capillary 144 (*) 70 - 99 mg/dL    Comment 1 Notify RN     GLUCOSE, CAPILLARY     Status: Abnormal   Collection Time   02/04/12  4:35 PM      Component Value Range Comment   Glucose-Capillary 209 (*) 70 - 99 mg/dL    Comment 1 Notify RN     GLUCOSE, CAPILLARY     Status: Abnormal   Collection Time   02/04/12  8:17 PM      Component Value Range Comment   Glucose-Capillary 153 (*) 70 - 99 mg/dL   GLUCOSE, CAPILLARY     Status: Abnormal   Collection Time   02/05/12  5:56 AM      Component Value Range Comment   Glucose-Capillary 126 (*) 70 - 99 mg/dL   GLUCOSE, CAPILLARY     Status: Abnormal   Collection Time   02/05/12  7:11 AM      Component Value Range Comment   Glucose-Capillary 136 (*) 70 - 99 mg/dL    Comment  1 Notify RN        HEENT: normal Cardio: RRR Resp: CTA B/L GI: BS positive Extremity:  No Edema Skin:   Other R great toe onychogryphosis Neuro: Alert/Oriented, Cranial Nerve Abnormalities R central 7, Normal Sensory, Abnormal Motor 4-/5 in RUE, 4/5 in RLE, Abnormal FMC Ataxic/ dec FMC and Dysarthric Musc/Skel:  Other no foot or ankle deformities Pedal and post tib pulses normal, skin warm and dry Mood and affect are appropriate  Assessment/Plan: 1. Functional deficits secondary to L basal ganglia and corona radiata infarct, RHP, R fine motor and coordination deficits which require 3+ hours per day of interdisciplinary therapy in a comprehensive inpatient rehab  setting. Physiatrist is providing close team supervision and 24 hour management of active medical problems listed below. Physiatrist and rehab team continue to assess barriers to discharge/monitor patient progress toward functional and medical goals. FIM: FIM - Bathing Bathing Steps Patient Completed: Chest;Right Arm;Left Arm;Abdomen;Front perineal area;Buttocks;Right upper leg;Left upper leg;Right lower leg (including foot);Left lower leg (including foot) Bathing: 0: Activity did not occur  FIM - Upper Body Dressing/Undressing Upper body dressing/undressing steps patient completed: Thread/unthread right sleeve of pullover shirt/dresss;Thread/unthread left sleeve of pullover shirt/dress;Put head through opening of pull over shirt/dress;Pull shirt over trunk Upper body dressing/undressing: 5: Supervision: Safety issues/verbal cues FIM - Lower Body Dressing/Undressing Lower body dressing/undressing steps patient completed: Thread/unthread right underwear leg;Thread/unthread left underwear leg;Pull underwear up/down;Thread/unthread right pants leg;Thread/unthread left pants leg;Pull pants up/down;Don/Doff right sock;Don/Doff left sock;Don/Doff right shoe;Don/Doff left shoe;Fasten/unfasten right shoe;Fasten/unfasten left shoe Lower body dressing/undressing: 5: Set-up assist to: Don/Doff TED stocking  FIM - Toileting Toileting steps completed by patient: Adjust clothing prior to toileting;Performs perineal hygiene;Adjust clothing after toileting Toileting: 5: Supervision: Safety issues/verbal cues  FIM - Toilet Transfers Toilet Transfers: 3-To toilet/BSC: Mod A (lift or lower assist);3-From toilet/BSC: Mod A (lift or lower assist)  FIM - Bed/Chair Transfer Bed/Chair Transfer: 4: Bed > Chair or W/C: Min A (steadying Pt. > 75%);4: Chair or W/C > Bed: Min A (steadying Pt. > 75%)  FIM - Locomotion: Wheelchair Distance: 25 Locomotion: Wheelchair: 1: Total Assistance/staff pushes wheelchair  (Pt<25%) FIM - Locomotion: Ambulation Locomotion: Ambulation Assistive Devices: Designer, industrial/product Ambulation/Gait Assistance: 4: Min assist Locomotion: Ambulation: 2: Travels 50 - 149 ft with minimal assistance (Pt.>75%)  Comprehension Comprehension Mode: Auditory Comprehension: 6-Follows complex conversation/direction: With extra time/assistive device  Expression Expression Mode: Verbal Expression: 5-Expresses basic needs/ideas: With extra time/assistive device  Social Interaction Social Interaction: 6-Interacts appropriately with others with medication or extra time (anti-anxiety, antidepressant).  Problem Solving Problem Solving: 5-Solves complex 90% of the time/cues < 10% of the time  Memory Memory Assistive Devices: Other (Comment) (written reminders) Memory: 5-Recognizes or recalls 90% of the time/requires cueing < 10% of the time  Medical Problem List and Plan:  1. Thrombotic left lenticular nucleus-corona radiata infarct  2. DVT Prophylaxis/Anticoagulation: SCDs. Monitor for signs of DVT  3. Mood: Pt is generally up beat and motivated. Team to provide egosupport as appropriate  4. Neuropsych: This patient is capable of making decisions on his/her own behalf.  5. Hypertension. Lopressor 12.5 mg twice a day, Cozaar 50 mg daily. Monitor with increased mobility  6. Diabetes mellitus. Hemoglobin A1c 6.7. Amaryl 4 mg daily. Check blood sugars a.c. and at bedtime  7. Hyperlipidemia. Lovaza and Zocor  8. Chronic renal insufficiency. Baseline creatinine 1.6. Followup labs and encourage adequate fluid intake  LOS (Days) 3 A FACE TO FACE EVALUATION WAS PERFORMED  Nili Honda E 02/05/2012, 9:31  AM

## 2012-02-06 ENCOUNTER — Inpatient Hospital Stay (HOSPITAL_COMMUNITY): Payer: Medicare Other | Admitting: Physical Therapy

## 2012-02-06 ENCOUNTER — Inpatient Hospital Stay (HOSPITAL_COMMUNITY): Payer: Medicare Other

## 2012-02-06 ENCOUNTER — Inpatient Hospital Stay (HOSPITAL_COMMUNITY): Payer: Medicare Other | Admitting: Speech Pathology

## 2012-02-06 LAB — GLUCOSE, CAPILLARY: Glucose-Capillary: 197 mg/dL — ABNORMAL HIGH (ref 70–99)

## 2012-02-06 NOTE — Progress Notes (Signed)
Physical Therapy Session Note  Patient Details  Name: PRUDENCIO KOLLER MRN: [id number] Date of Birth: 1928-09-08  Today's Date: 02/06/2012 Time: 4098-1191 Time Calculation (min): 45 min  Short Term Goals: Week 1:  PT Short Term Goal 1 (Week 1): pt will perform all bed mobility with supervision PT Short Term Goal 2 (Week 1): pt will basic transfer L and R with min assist PT Short Term Goal 4 (Week 1): pt will perform gait x 150 with min assist PT Short Term Goal 5 (Week 1): pt will perform dynamic standing activity x 5 minutes with min assist   Therapy Documentation Precautions:  Precautions Precautions: Fall Restrictions Weight Bearing Restrictions: No Pain: Pain Assessment Pain Assessment: No/denies pain  Therapeutic Exercise:(15') Nu-Step level 3, x 10' with rest break x 1 Gait Training:(15') no A.D. Inside room with close supervision, using RW 3 x 200' with ocassional episodes of R foot skidding on floor in swing phase due to lack of foot clearance.                                Up/down 4 steps with handrails and S/Independence. Therapeutic Activity:(15') Transfer Training; sit<->stand S/Independent and able to perform without hand support on bed or chair. Car transfers with S/Independent   Therapy/Group: Individual Therapy  Rhyann Berton J 02/06/2012, 9:13 AM

## 2012-02-06 NOTE — Progress Notes (Signed)
Patient ID: WILLEM CARDIEL, male   DOB: 02/19/1929, 78 y.o.   MRN: [id number] Patient ID: SHIGERU LAMBERTH, male   DOB: Nov 23, 1928, 78 y.o.   MRN: [id number]  10/5.  Uneventful night. Remains upbeat and very positive about his progress. Examination-mild aphasia and right hemiparesis; chest is clear; cardiovascular normal heart sounds; abdomen flat nondistended; there is no edema  CBG (last 3)   Basename 02/05/12 2105 02/05/12 1642 02/05/12 1128  GLUCAP 165* 173* 137*      Atthew Mundinger Stalls is a 78 y.o. right-handed male with history of hypertension, chronic renal insufficiency with baseline creatinine 1.7. Admitted 01/28/2012 with right sided weakness and slurred speech. MRI of the brain showed acute nonhemorrhagic infarct posterior left lenticular nucleus extending into the posterior aspect of the left corona radiata. MRA of the head with intracranial atherosclerotic type changes as well 1.4 mm aneurysm posterior lateral aspect left internal carotid artery. Echocardiogram with ejection fraction of 70% and grade 1 diastolic dysfunction. Carotid Dopplers with no ICA stenosis. Patient did not receive TPA. Neurology services consulted placed on Plavix therapy for stroke prophylaxis.  Dr. Darrick Penna vascular surgery notified in regards to small aneurysm identified on MRA advised no surgical intervention or further workup   Subjective/Complaints: What causes strokes?  We discussed this today including his risk factors.  Also discussed diabetic management Objective: Vital Signs: Blood pressure 151/69, pulse 66, temperature 97.5 F (36.4 C), temperature source Oral, resp. rate 17, height 5\' 10"  (1.778 m), weight 81.5 kg (179 lb 10.8 oz), SpO2 99.00%. No results found. Results for orders placed during the hospital encounter of 02/02/12 (from the past 72 hour(s))  GLUCOSE, CAPILLARY     Status: Abnormal   Collection Time   02/03/12 11:41 AM      Component Value Range Comment   Glucose-Capillary 147 (*) 70 - 99  mg/dL    Comment 1 Notify RN     GLUCOSE, CAPILLARY     Status: Abnormal   Collection Time   02/03/12  4:31 PM      Component Value Range Comment   Glucose-Capillary 122 (*) 70 - 99 mg/dL    Comment 1 Notify RN      Comment 2 Documented in Chart     GLUCOSE, CAPILLARY     Status: Abnormal   Collection Time   02/03/12  9:18 PM      Component Value Range Comment   Glucose-Capillary 208 (*) 70 - 99 mg/dL   GLUCOSE, CAPILLARY     Status: Abnormal   Collection Time   02/04/12  6:25 AM      Component Value Range Comment   Glucose-Capillary 142 (*) 70 - 99 mg/dL   GLUCOSE, CAPILLARY     Status: Abnormal   Collection Time   02/04/12  7:12 AM      Component Value Range Comment   Glucose-Capillary 134 (*) 70 - 99 mg/dL    Comment 1 Notify RN     GLUCOSE, CAPILLARY     Status: Abnormal   Collection Time   02/04/12 12:10 PM      Component Value Range Comment   Glucose-Capillary 144 (*) 70 - 99 mg/dL    Comment 1 Notify RN     GLUCOSE, CAPILLARY     Status: Abnormal   Collection Time   02/04/12  4:35 PM      Component Value Range Comment   Glucose-Capillary 209 (*) 70 - 99 mg/dL    Comment 1 Notify RN  GLUCOSE, CAPILLARY     Status: Abnormal   Collection Time   02/04/12  8:17 PM      Component Value Range Comment   Glucose-Capillary 153 (*) 70 - 99 mg/dL   GLUCOSE, CAPILLARY     Status: Abnormal   Collection Time   02/05/12  5:56 AM      Component Value Range Comment   Glucose-Capillary 126 (*) 70 - 99 mg/dL   GLUCOSE, CAPILLARY     Status: Abnormal   Collection Time   02/05/12  7:11 AM      Component Value Range Comment   Glucose-Capillary 136 (*) 70 - 99 mg/dL    Comment 1 Notify RN     GLUCOSE, CAPILLARY     Status: Abnormal   Collection Time   02/05/12 11:28 AM      Component Value Range Comment   Glucose-Capillary 137 (*) 70 - 99 mg/dL    Comment 1 Notify RN     GLUCOSE, CAPILLARY     Status: Abnormal   Collection Time   02/05/12  4:42 PM      Component Value Range  Comment   Glucose-Capillary 173 (*) 70 - 99 mg/dL   GLUCOSE, CAPILLARY     Status: Abnormal   Collection Time   02/05/12  9:05 PM      Component Value Range Comment   Glucose-Capillary 165 (*) 70 - 99 mg/dL      HEENT: normal Cardio: RRR Resp: CTA B/L GI: BS positive Extremity:  No Edema Skin:   Other R great toe onychogryphosis Neuro: Alert/Oriented, Cranial Nerve Abnormalities R central 7, Normal Sensory, Abnormal Motor 4-/5 in RUE, 4/5 in RLE, Abnormal FMC Ataxic/ dec FMC and Dysarthric Musc/Skel:  Other no foot or ankle deformities Pedal and post tib pulses normal, skin warm and dry Mood and affect are appropriate  Assessment/Plan: 1. Functional deficits secondary to L basal ganglia and corona radiata infarct, RHP, R fine motor and coordination deficits which require 3+ hours per day of interdisciplinary therapy in a comprehensive inpatient rehab setting. Physiatrist is providing close team supervision and 24 hour management of active medical problems listed below. Physiatrist and rehab team continue to assess barriers to discharge/monitor patient progress toward functional and medical goals. FIM: FIM - Bathing Bathing Steps Patient Completed: Chest;Right Arm;Left Arm;Abdomen;Front perineal area;Buttocks;Right upper leg;Left upper leg;Right lower leg (including foot);Left lower leg (including foot) Bathing: 0: Activity did not occur  FIM - Upper Body Dressing/Undressing Upper body dressing/undressing steps patient completed: Thread/unthread right sleeve of pullover shirt/dresss;Thread/unthread left sleeve of pullover shirt/dress;Put head through opening of pull over shirt/dress;Pull shirt over trunk Upper body dressing/undressing: 5: Supervision: Safety issues/verbal cues FIM - Lower Body Dressing/Undressing Lower body dressing/undressing steps patient completed: Thread/unthread right underwear leg;Thread/unthread left underwear leg;Pull underwear up/down;Thread/unthread right  pants leg;Thread/unthread left pants leg;Pull pants up/down;Don/Doff right sock;Don/Doff left sock;Don/Doff right shoe;Don/Doff left shoe;Fasten/unfasten right shoe;Fasten/unfasten left shoe Lower body dressing/undressing: 5: Set-up assist to: Don/Doff TED stocking  FIM - Toileting Toileting steps completed by patient: Adjust clothing prior to toileting;Performs perineal hygiene;Adjust clothing after toileting Toileting: 5: Supervision: Safety issues/verbal cues  FIM - Toilet Transfers Toilet Transfers: 4-To toilet/BSC: Min A (steadying Pt. > 75%)  FIM - Bed/Chair Transfer Bed/Chair Transfer: 4: Bed > Chair or W/C: Min A (steadying Pt. > 75%);4: Chair or W/C > Bed: Min A (steadying Pt. > 75%)  FIM - Locomotion: Wheelchair Distance: 25 Locomotion: Wheelchair: 1: Total Assistance/staff pushes wheelchair (Pt<25%) FIM - Locomotion: Ambulation  Locomotion: Ambulation Assistive Devices: Designer, industrial/product Ambulation/Gait Assistance: 4: Min assist Locomotion: Ambulation: 4: Travels 150 ft or more with minimal assistance (Pt.>75%)  Comprehension Comprehension Mode: Auditory Comprehension: 6-Follows complex conversation/direction: With extra time/assistive device  Expression Expression Mode: Verbal Expression: 5-Expresses complex 90% of the time/cues < 10% of the time  Social Interaction Social Interaction: 6-Interacts appropriately with others with medication or extra time (anti-anxiety, antidepressant).  Problem Solving Problem Solving: 4-Solves basic 75 - 89% of the time/requires cueing 10 - 24% of the time  Memory Memory Assistive Devices: Other (Comment) (written reminders) Memory: 5-Recognizes or recalls 90% of the time/requires cueing < 10% of the time  Medical Problem List and Plan:  1. Thrombotic left lenticular nucleus-corona radiata infarct  2. DVT Prophylaxis/Anticoagulation: SCDs. Monitor for signs of DVT  3. Mood: Pt is generally up beat and motivated. Team to provide  egosupport as appropriate  4. Neuropsych: This patient is capable of making decisions on his/her own behalf.  5. Hypertension. Lopressor 12.5 mg twice a day, Cozaar 50 mg daily. Monitor with increased mobility  6. Diabetes mellitus. Hemoglobin A1c 6.7. Amaryl 4 mg daily. Check blood sugars a.c. and at bedtime  7. Hyperlipidemia. Lovaza and Zocor  8. Chronic renal insufficiency. Baseline creatinine 1.6. Followup labs and encourage adequate fluid intake  LOS (Days) 4 A FACE TO FACE EVALUATION WAS PERFORMED  Rogelia Boga 02/06/2012, 9:18 AM

## 2012-02-06 NOTE — Progress Notes (Signed)
Physical Therapy Note  Patient Details  Name: Tyler [name] MRN: [id number] Date of Birth: 08-24-1928 Today's Date: 02/06/2012  1300-1355 (55 minutes) individual Pain: no complaint of pain Focus of treatment: Gait training without AD; Neuro re-ed RT LE in closed chain positions Treatment : Gait to/from room 120 feet close SBA at hips; up/down 6 inch step RT LE only for quad/hip extension strengthening/control X 15; standing with LT LE on step to increase weight bearing RT LE with no UE support for forced use of RT LE; alternate stepping to 6 inch step with no UE support min assist for balance; alternate stepping to targets on floor forward/sideways min assist for safety; stepping over obstacle 2 X 10 min assist for balance;crossover stepping with assist to prevent fall (pt unable to complete full step over without loss of balance).   Ajani Rineer,JIM 02/06/2012, 8:00 AM

## 2012-02-06 NOTE — Progress Notes (Addendum)
Occupational Therapy Session Note  Patient Details  Name: Tyler [name] MRN: [id number] Date of Birth: 04/11/1929  Today's Date: 02/06/2012 Time: 1000-1045 Time Calculation (min): 45 min  Short Term Goals: Week 1:  OT Short Term Goal 1 (Week 1): Patient will be instructed on HEP. OT Short Term Goal 2 (Week 1): Patient will increase awareness of RUE with use of compensatory techniques and Min verbal cues. OT Short Term Goal 3 (Week 1): Patient will increase RUE FMC to increase functional performance during grooming tasks.  Skilled Therapeutic Interventions/Progress Updates:  ADL re-training session performed in shower this AM. Session with focus on ADL performance, functional transfers, task sequencing, attention to right side, and safety awareness. Patient needed intermittent vc's to attend to right arm and allow his right arm to perform task first before disregarding it and doing everything with his left hand. Patient was given long handled sponge to increase functional performance during LB bathing. Patient did need vc's to pick up right leg when walking with walker in bathroom and bedroom.   Therapy Documentation Precautions:  Precautions Precautions: Fall Restrictions Weight Bearing Restrictions: No Pain: Pain Assessment Pain Assessment: No/denies pain  See FIM for current functional status  Therapy/Group: Individual Therapy  Limmie Patricia, OTR/L 02/06/2012, 12:50 PM  Addendum: fixed time LE

## 2012-02-06 NOTE — Plan of Care (Signed)
Problem: RH SKIN INTEGRITY Goal: RH STG SKIN FREE OF INFECTION/BREAKDOWN Skin free of infection with min assist  Outcome: Progressing Patient demonstrates turning and repositioning self   Problem: RH PAIN MANAGEMENT Goal: RH STG PAIN MANAGED AT OR BELOW PT'S PAIN GOAL Pain less or equal to 2  Outcome: Completed/Met Date Met:  02/06/12 No complaints of pain

## 2012-02-06 NOTE — Progress Notes (Signed)
Speech Language Pathology Daily Session Note  Patient Details  Name: [name] MRN: [id number] Date of Birth: 08-Jul-1928  Today's Date: 02/06/2012 Time: 1110-1130 Time Calculation (min): 20 min  Short Term Goals: Week 1: SLP Short Term Goal 1 (Week 1): Pt will improve speech intelligibility by completing 10 reps of oral motor exercises for increased ROM, strength, and coordination for functional verbal communication SLP Short Term Goal 1 - Progress (Week 1): Progressing toward goal SLP Short Term Goal 2 (Week 1): Pt will increase functional speech production for verbalizing and using compensatory strategies to 80% accuracy given mod cues. SLP Short Term Goal 2 - Progress (Week 1): Progressing toward goal SLP Short Term Goal 3 (Week 1): Pt will improve functional thought organization and reasoning skills by completing tasks of increasing complexity with 75% accuracy given mod cues. SLP Short Term Goal 3 - Progress (Week 1): Progressing toward goal SLP Short Term Goal 4 (Week 1): Pt will increase functional cognitive skills for STM recall of information at sentence level after 5 minute time lapse to 80% accuracy given min cues. SLP Short Term Goal 4 - Progress (Week 1): Progressing toward goal  Skilled Therapeutic Interventions: Treatment focused on speech clarity and awareness for self-correction.  Pt engage in conversational speech with overall min assist to execute intelligibility strategies.  Verbalized purpose of strategies; mod cues for self-correction.     FIM:   See FIM tab - did not populate   Pain Pain Assessment Pain Assessment: No/denies pain  Therapy/Group: Individual Therapy  Blenda Mounts Laurice 02/06/2012, 2:33 PM

## 2012-02-07 ENCOUNTER — Inpatient Hospital Stay (HOSPITAL_COMMUNITY): Payer: Medicare Other | Admitting: *Deleted

## 2012-02-07 LAB — GLUCOSE, CAPILLARY: Glucose-Capillary: 128 mg/dL — ABNORMAL HIGH (ref 70–99)

## 2012-02-07 NOTE — Progress Notes (Signed)
Patient ID: Tyler [name], male   DOB: Aug 12, 1928, 78 y.o.   MRN: [id number] Patient ID: Tyler [name], male   DOB: March 21, 1929, 78 y.o.   MRN: [id number] Patient ID: Tyler [name], male   DOB: [birth year], 78 y.o.   MRN: [id number]  10/6.  Uneventful night. Remains upbeat and very positive about his progress. Feeding himself breakfast with his right hand with a modified utensil. Examination-mild aphasia with right central facial and right hemiparesis; chest is clear; cardiovascular normal heart sounds; abdomen flat nondistended; there is no edema  BP Readings from Last 3 Encounters:  02/07/12 174/62  02/02/12 128/88    CBG (last 3)   Basename 02/07/12 0636 02/06/12 2108 02/06/12 1632  GLUCAP 93 92 121*    systolic blood pressure up this a.m. Will observe over the next 24 hours    Tyler [name] is a 78 y.o. right-handed male with history of hypertension, chronic renal insufficiency with baseline creatinine 1.7. Admitted 01/28/2012 with right sided weakness and slurred speech. MRI of the brain showed acute nonhemorrhagic infarct posterior left lenticular nucleus extending into the posterior aspect of the left corona radiata. MRA of the head with intracranial atherosclerotic type changes as well 1.4 mm aneurysm posterior lateral aspect left internal carotid artery. Echocardiogram with ejection fraction of 70% and grade 1 diastolic dysfunction. Carotid Dopplers with no ICA stenosis. Patient did not receive TPA. Neurology services consulted placed on Plavix therapy for stroke prophylaxis.  Dr. Darrick Penna vascular surgery notified in regards to small aneurysm identified on MRA advised no surgical intervention or further workup   Subjective/Complaints: What causes strokes?  We discussed this today including his risk factors.  Also discussed diabetic management Objective: Vital Signs: Blood pressure 174/62, pulse 68, temperature 97.8 F (36.6 C), temperature source Oral, resp. rate 17, height 5\' 10"  (1.778  m), weight 81.5 kg (179 lb 10.8 oz), SpO2 97.00%. No results found. Results for orders placed during the hospital encounter of 02/02/12 (from the past 72 hour(s))  GLUCOSE, CAPILLARY     Status: Abnormal   Collection Time   02/04/12 12:10 PM      Component Value Range Comment   Glucose-Capillary 144 (*) 70 - 99 mg/dL    Comment 1 Notify RN     GLUCOSE, CAPILLARY     Status: Abnormal   Collection Time   02/04/12  4:35 PM      Component Value Range Comment   Glucose-Capillary 209 (*) 70 - 99 mg/dL    Comment 1 Notify RN     GLUCOSE, CAPILLARY     Status: Abnormal   Collection Time   02/04/12  8:17 PM      Component Value Range Comment   Glucose-Capillary 153 (*) 70 - 99 mg/dL   GLUCOSE, CAPILLARY     Status: Abnormal   Collection Time   02/05/12  5:56 AM      Component Value Range Comment   Glucose-Capillary 126 (*) 70 - 99 mg/dL   GLUCOSE, CAPILLARY     Status: Abnormal   Collection Time   02/05/12  7:11 AM      Component Value Range Comment   Glucose-Capillary 136 (*) 70 - 99 mg/dL    Comment 1 Notify RN     GLUCOSE, CAPILLARY     Status: Abnormal   Collection Time   02/05/12 11:28 AM      Component Value Range Comment   Glucose-Capillary 137 (*) 70 - 99 mg/dL    Comment  1 Notify RN     GLUCOSE, CAPILLARY     Status: Abnormal   Collection Time   02/05/12  4:42 PM      Component Value Range Comment   Glucose-Capillary 173 (*) 70 - 99 mg/dL   GLUCOSE, CAPILLARY     Status: Abnormal   Collection Time   02/05/12  9:05 PM      Component Value Range Comment   Glucose-Capillary 165 (*) 70 - 99 mg/dL   GLUCOSE, CAPILLARY     Status: Abnormal   Collection Time   02/06/12  9:08 AM      Component Value Range Comment   Glucose-Capillary 197 (*) 70 - 99 mg/dL   GLUCOSE, CAPILLARY     Status: Abnormal   Collection Time   02/06/12 11:02 AM      Component Value Range Comment   Glucose-Capillary 186 (*) 70 - 99 mg/dL   GLUCOSE, CAPILLARY     Status: Abnormal   Collection Time    02/06/12  4:32 PM      Component Value Range Comment   Glucose-Capillary 121 (*) 70 - 99 mg/dL   GLUCOSE, CAPILLARY     Status: Normal   Collection Time   02/06/12  9:08 PM      Component Value Range Comment   Glucose-Capillary 92  70 - 99 mg/dL   GLUCOSE, CAPILLARY     Status: Normal   Collection Time   02/07/12  6:36 AM      Component Value Range Comment   Glucose-Capillary 93  70 - 99 mg/dL      HEENT: normal Cardio: RRR Resp: CTA B/L GI: BS positive Extremity:  No Edema Skin:   Other R great toe onychogryphosis Neuro: Alert/Oriented, Cranial Nerve Abnormalities R central 7, Normal Sensory, Abnormal Motor 4-/5 in RUE, 4/5 in RLE, Abnormal FMC Ataxic/ dec FMC and Dysarthric Musc/Skel:  Other no foot or ankle deformities Pedal and post tib pulses normal, skin warm and dry Mood and affect are appropriate  Assessment/Plan: 1. Functional deficits secondary to L basal ganglia and corona radiata infarct, RHP, R fine motor and coordination deficits which require 3+ hours per day of interdisciplinary therapy in a comprehensive inpatient rehab setting. Physiatrist is providing close team supervision and 24 hour management of active medical problems listed below. Physiatrist and rehab team continue to assess barriers to discharge/monitor patient progress toward functional and medical goals. FIM: FIM - Bathing Bathing Steps Patient Completed: Chest;Right Arm;Left Arm;Abdomen;Front perineal area;Buttocks;Right upper leg;Left upper leg;Right lower leg (including foot);Left lower leg (including foot) Bathing: 5: Supervision: Safety issues/verbal cues  FIM - Upper Body Dressing/Undressing Upper body dressing/undressing steps patient completed: Thread/unthread right sleeve of pullover shirt/dresss;Thread/unthread left sleeve of pullover shirt/dress;Put head through opening of pull over shirt/dress;Pull shirt over trunk Upper body dressing/undressing: 5: Supervision: Safety issues/verbal cues FIM  - Lower Body Dressing/Undressing Lower body dressing/undressing steps patient completed: Thread/unthread right underwear leg;Thread/unthread left underwear leg;Pull underwear up/down;Thread/unthread right pants leg;Thread/unthread left pants leg;Pull pants up/down;Don/Doff right sock;Don/Doff left sock;Don/Doff right shoe;Don/Doff left shoe;Fasten/unfasten right shoe;Fasten/unfasten left shoe Lower body dressing/undressing: 5: Set-up assist to: Don/Doff TED stocking  FIM - Toileting Toileting steps completed by patient: Adjust clothing prior to toileting;Performs perineal hygiene;Adjust clothing after toileting Toileting Assistive Devices: Grab bar or rail for support Toileting: 5: Supervision: Safety issues/verbal cues  FIM - Diplomatic Services operational officer Devices: Grab bars;Walker Toilet Transfers: 4-To toilet/BSC: Min A (steadying Pt. > 75%);5-From toilet/BSC: Supervision (verbal cues/safety issues)  FIM -  Bed/Chair Transfer Bed/Chair Transfer: 4: Bed > Chair or W/C: Min A (steadying Pt. > 75%);4: Chair or W/C > Bed: Min A (steadying Pt. > 75%)  FIM - Locomotion: Wheelchair Distance: 25 Locomotion: Wheelchair: 1: Total Assistance/staff pushes wheelchair (Pt<25%) FIM - Locomotion: Ambulation Locomotion: Ambulation Assistive Devices: Designer, industrial/product Ambulation/Gait Assistance: 4: Min assist Locomotion: Ambulation: 4: Travels 150 ft or more with minimal assistance (Pt.>75%)  Comprehension Comprehension Mode: Auditory Comprehension: 6-Follows complex conversation/direction: With extra time/assistive device  Expression Expression Mode: Verbal Expression: 5-Expresses complex 90% of the time/cues < 10% of the time  Social Interaction Social Interaction: 6-Interacts appropriately with others with medication or extra time (anti-anxiety, antidepressant).  Problem Solving Problem Solving: 4-Solves basic 75 - 89% of the time/requires cueing 10 - 24% of the  time  Memory Memory Assistive Devices: Other (Comment) (written reminders) Memory: 5-Recognizes or recalls 90% of the time/requires cueing < 10% of the time  Medical Problem List and Plan:  1. Thrombotic left lenticular nucleus-corona radiata infarct  2. DVT Prophylaxis/Anticoagulation: SCDs. Monitor for signs of DVT  3. Mood: Pt is generally up beat and motivated. Team to provide egosupport as appropriate  4. Neuropsych: This patient is capable of making decisions on his/her own behalf.  5. Hypertension. Lopressor 12.5 mg twice a day, Cozaar 50 mg daily. Monitor with increased mobility  6. Diabetes mellitus. Hemoglobin A1c 6.7. Amaryl 4 mg daily. Check blood sugars a.c. and at bedtime  7. Hyperlipidemia. Lovaza and Zocor  8. Chronic renal insufficiency. Baseline creatinine 1.6. Followup labs and encourage adequate fluid intake  LOS (Days) 5 A FACE TO FACE EVALUATION WAS PERFORMED  Rogelia Boga 02/07/2012, 8:47 AM

## 2012-02-07 NOTE — Progress Notes (Signed)
Physical Therapy Note  Patient Details  Name: Tyler [name] MRN: [id number] Date of Birth: 03/01/1929 Today's Date: 02/07/2012  1100-1155 (55 minutes) group Pain : no complaint of pain Pt participated in PT group session focused on gait training /safety/ endurance. Pt ambulates to/from gym 120 feet SBA RW and 160 feet X 3 with min to close SBA without AD (with decrease gait speed).   Kweli Grassel,JIM 02/07/2012, 7:33 AM

## 2012-02-08 ENCOUNTER — Inpatient Hospital Stay (HOSPITAL_COMMUNITY): Payer: Medicare Other

## 2012-02-08 ENCOUNTER — Inpatient Hospital Stay (HOSPITAL_COMMUNITY): Payer: Medicare Other | Admitting: Physical Therapy

## 2012-02-08 ENCOUNTER — Inpatient Hospital Stay (HOSPITAL_COMMUNITY): Payer: Medicare Other | Admitting: Speech Pathology

## 2012-02-08 LAB — GLUCOSE, CAPILLARY
Glucose-Capillary: 174 mg/dL — ABNORMAL HIGH (ref 70–99)
Glucose-Capillary: 110 mg/dL — ABNORMAL HIGH (ref 70–99)
Glucose-Capillary: 162 mg/dL — ABNORMAL HIGH (ref 70–99)

## 2012-02-08 NOTE — Progress Notes (Signed)
Patient ID: Tyler [name], male   DOB: 25-Dec-1928, 78 y.o.   MRN: [id number] Tyler [name] is a 78 y.o. right-handed male with history of hypertension, chronic renal insufficiency with baseline creatinine 1.7. Admitted 01/28/2012 with right sided weakness and slurred speech. MRI of the brain showed acute nonhemorrhagic infarct posterior left lenticular nucleus extending into the posterior aspect of the left corona radiata. MRA of the head with intracranial atherosclerotic type changes as well 1.4 mm aneurysm posterior lateral aspect left internal carotid artery. Echocardiogram with ejection fraction of 70% and grade 1 diastolic dysfunction. Carotid Dopplers with no ICA stenosis. Patient did not receive TPA. Neurology services consulted placed on Plavix therapy for stroke prophylaxis.  Dr. Darrick Penna vascular surgery notified in regards to small aneurysm identified on MRA advised no surgical intervention or further workup   Subjective/Complaints:  Objective: Vital Signs: Blood pressure 144/71, pulse 66, temperature 97.8 F (36.6 C), temperature source Oral, resp. rate 17, height 5\' 10"  (1.778 m), weight 81.5 kg (179 lb 10.8 oz), SpO2 97.00%. No results found. Results for orders placed during the hospital encounter of 02/02/12 (from the past 72 hour(s))  GLUCOSE, CAPILLARY     Status: Abnormal   Collection Time   02/05/12 11:28 AM      Component Value Range Comment   Glucose-Capillary 137 (*) 70 - 99 mg/dL    Comment 1 Notify RN     GLUCOSE, CAPILLARY     Status: Abnormal   Collection Time   02/05/12  4:42 PM      Component Value Range Comment   Glucose-Capillary 173 (*) 70 - 99 mg/dL   GLUCOSE, CAPILLARY     Status: Abnormal   Collection Time   02/05/12  9:05 PM      Component Value Range Comment   Glucose-Capillary 165 (*) 70 - 99 mg/dL   GLUCOSE, CAPILLARY     Status: Abnormal   Collection Time   02/06/12  9:08 AM      Component Value Range Comment   Glucose-Capillary 197 (*) 70 - 99 mg/dL     GLUCOSE, CAPILLARY     Status: Abnormal   Collection Time   02/06/12 11:02 AM      Component Value Range Comment   Glucose-Capillary 186 (*) 70 - 99 mg/dL   GLUCOSE, CAPILLARY     Status: Abnormal   Collection Time   02/06/12  4:32 PM      Component Value Range Comment   Glucose-Capillary 121 (*) 70 - 99 mg/dL   GLUCOSE, CAPILLARY     Status: Normal   Collection Time   02/06/12  9:08 PM      Component Value Range Comment   Glucose-Capillary 92  70 - 99 mg/dL   GLUCOSE, CAPILLARY     Status: Normal   Collection Time   02/07/12  6:36 AM      Component Value Range Comment   Glucose-Capillary 93  70 - 99 mg/dL   GLUCOSE, CAPILLARY     Status: Abnormal   Collection Time   02/07/12 11:57 AM      Component Value Range Comment   Glucose-Capillary 154 (*) 70 - 99 mg/dL    Comment 1 Notify RN     GLUCOSE, CAPILLARY     Status: Abnormal   Collection Time   02/07/12  4:14 PM      Component Value Range Comment   Glucose-Capillary 128 (*) 70 - 99 mg/dL   GLUCOSE, CAPILLARY  Status: Abnormal   Collection Time   02/07/12  8:28 PM      Component Value Range Comment   Glucose-Capillary 174 (*) 70 - 99 mg/dL    Comment 1 Notify RN     GLUCOSE, CAPILLARY     Status: Abnormal   Collection Time   02/08/12  7:23 AM      Component Value Range Comment   Glucose-Capillary 110 (*) 70 - 99 mg/dL      HEENT: normal Cardio: RRR Resp: CTA B/L GI: BS positive Extremity:  No Edema Skin:   Other R great toe onychogryphosis, healed incision R shoulder Neuro: Alert/Oriented, Cranial Nerve Abnormalities R central 7, Normal Sensory, Abnormal Motor 4-/5 in RUE, 4/5 in RLE, Abnormal FMC Ataxic/ dec FMC and Dysarthric Musc/Skel:  Other no foot or ankle deformities Pedal and post tib pulses normal, skin warm and dry Mood and affect are appropriate  Assessment/Plan: 1. Functional deficits secondary to L basal ganglia and corona radiata infarct, RHP, R fine motor and coordination deficits which require 3+  hours per day of interdisciplinary therapy in a comprehensive inpatient rehab setting. Physiatrist is providing close team supervision and 24 hour management of active medical problems listed below. Physiatrist and rehab team continue to assess barriers to discharge/monitor patient progress toward functional and medical goals. FIM: FIM - Bathing Bathing Steps Patient Completed: Chest;Right Arm;Left Arm;Abdomen;Front perineal area;Buttocks;Right upper leg;Left upper leg;Right lower leg (including foot);Left lower leg (including foot) Bathing: 5: Supervision: Safety issues/verbal cues  FIM - Upper Body Dressing/Undressing Upper body dressing/undressing steps patient completed: Thread/unthread right sleeve of pullover shirt/dresss;Thread/unthread left sleeve of pullover shirt/dress;Put head through opening of pull over shirt/dress;Pull shirt over trunk Upper body dressing/undressing: 5: Supervision: Safety issues/verbal cues FIM - Lower Body Dressing/Undressing Lower body dressing/undressing steps patient completed: Thread/unthread right underwear leg;Thread/unthread left underwear leg;Pull underwear up/down;Thread/unthread right pants leg;Thread/unthread left pants leg;Pull pants up/down;Don/Doff right sock;Don/Doff left sock;Don/Doff right shoe;Don/Doff left shoe;Fasten/unfasten right shoe;Fasten/unfasten left shoe Lower body dressing/undressing: 5: Set-up assist to: Don/Doff TED stocking  FIM - Toileting Toileting steps completed by patient: Adjust clothing prior to toileting;Performs perineal hygiene;Adjust clothing after toileting Toileting Assistive Devices: Grab bar or rail for support Toileting: 5: Supervision: Safety issues/verbal cues  FIM - Diplomatic Services operational officer Devices: Grab bars;Walker Toilet Transfers: 4-To toilet/BSC: Min A (steadying Pt. > 75%)  FIM - Bed/Chair Transfer Bed/Chair Transfer Assistive Devices: Therapist, occupational: 4: Chair or W/C > Bed:  Min A (steadying Pt. > 75%)  FIM - Locomotion: Wheelchair Distance: 25 Locomotion: Wheelchair: 1: Total Assistance/staff pushes wheelchair (Pt<25%) FIM - Locomotion: Ambulation Locomotion: Ambulation Assistive Devices: Designer, industrial/product Ambulation/Gait Assistance: 4: Min assist Locomotion: Ambulation: 4: Travels 150 ft or more with minimal assistance (Pt.>75%)  Comprehension Comprehension Mode: Auditory Comprehension: 6-Follows complex conversation/direction: With extra time/assistive device  Expression Expression Mode: Verbal Expression: 5-Expresses complex 90% of the time/cues < 10% of the time  Social Interaction Social Interaction: 6-Interacts appropriately with others with medication or extra time (anti-anxiety, antidepressant).  Problem Solving Problem Solving: 5-Solves basic 90% of the time/requires cueing < 10% of the time  Memory Memory Assistive Devices: Other (Comment) (written reminders) Memory: 5-Recognizes or recalls 90% of the time/requires cueing < 10% of the time  Medical Problem List and Plan:  1. Thrombotic left lenticular nucleus-corona radiata infarct  2. DVT Prophylaxis/Anticoagulation: SCDs. Monitor for signs of DVT  3. Mood: Pt is generally up beat and motivated. Team to provide egosupport as appropriate  4. Neuropsych:  This patient is capable of making decisions on his/her own behalf.  5. Hypertension. Lopressor 12.5 mg twice a day, Cozaar 50 mg daily. Monitor with increased mobility  6. Diabetes mellitus. Hemoglobin A1c 6.7. Amaryl 4 mg daily. Check blood sugars a.c. and at bedtime  7. Hyperlipidemia. Lovaza and Zocor  8. Chronic renal insufficiency. Baseline creatinine 1.6. Followup labs and encourage adequate fluid intake  LOS (Days) 6 A FACE TO FACE EVALUATION WAS PERFORMED  KIRSTEINS,ANDREW E 02/08/2012, 8:19 AM

## 2012-02-08 NOTE — Progress Notes (Signed)
Physical Therapy Note  Patient Details  Name: Tyler [name] MRN: [id number] Date of Birth: 03-03-1929 Today's Date: 02/08/2012  4098-1191 (40 minutes) individual Pain: no complaint of pain Focus of treatment: gait training with/without AD ; therapeutic activities focused on RT LE knee /ankle control in stance , standing balance without UE support Treatment: gait to/from room to gym 120 feet SBA RW with min/mod vcs for heel toe gait on right (flat foot gait on right); Kinetron in standing with/without UE support for weight shifts/maintaining balance (min assist without UE support) ; 2 X 20  toe taps to 6 inch step with loss of balance X 2 to right. ; marching in place without UE support min assist for balance.   Trena Dunavan,JIM 02/08/2012, 7:47 AM

## 2012-02-08 NOTE — Progress Notes (Signed)
Occupational Therapy Session Note  Patient Details  Name: Tyler [name] MRN: [id number] Date of Birth: 1928-07-19  Today's Date: 02/08/2012 Time: 0730-0830 (1030-1100) Time Calculation (min): 60 min ( )  Short Term Goals: Week 1:  OT Short Term Goal 1 (Week 1): Patient will be instructed on HEP. OT Short Term Goal 2 (Week 1): Patient will increase awareness of RUE with use of compensatory techniques and Min verbal cues. OT Short Term Goal 3 (Week 1): Patient will increase RUE FMC to increase functional performance during grooming tasks.  Skilled Therapeutic Interventions/Progress Updates:  Session 1: Patient seen for self-feeding with breakfast. Patient required set-up of tray. Pt was able to feed self with increased time and vc's for technique. Pt was rest breaks and alternated arms when right arm became fatigued. Patient fed himself with his right hand for the majority of the time.  Pt dressed at sink side with Supervision.   Session 2: -functional transfer from recliner to w/c by placing both hands on right knee to bear weight; supervision; no device; 2 trials. -Card matching board; using RUE only to pick cards and place on board; to increase gross motor coordination; active wrist movement and standing balance/tolerance. -Hangripper (6.6#) with pegboard; RUE; to place and remove pegs from board; to increase grip strength  Therapy Documentation Precautions:  Precautions Precautions: Fall Restrictions Weight Bearing Restrictions: No Pain: Pain Assessment Pain Assessment: No/denies pain  See FIM for current functional status  Therapy/Group: Individual Therapy  Limmie Patricia, OTR/L 02/08/2012, 8:24 AM

## 2012-02-08 NOTE — Progress Notes (Addendum)
Speech Language Pathology Daily Session Note  Patient Details  Name: Tyler [name] MRN: [id number] Date of Birth: 1929-01-09  Today's Date: 02/08/2012 Time: 1335-1430 Time Calculation (min): 55 min  Short Term Goals: Week 1: SLP Short Term Goal 1 (Week 1): Pt will improve speech intelligibility by completing 10 reps of oral motor exercises for increased ROM, strength, and coordination for functional verbal communication SLP Short Term Goal 1 - Progress (Week 1): Progressing toward goal SLP Short Term Goal 2 (Week 1): Pt will increase functional speech production for verbalizing and using compensatory strategies to 80% accuracy given mod cues. SLP Short Term Goal 2 - Progress (Week 1): Progressing toward goal SLP Short Term Goal 3 (Week 1): Pt will improve functional thought organization and reasoning skills by completing tasks of increasing complexity with 75% accuracy given mod cues. SLP Short Term Goal 3 - Progress (Week 1): Progressing toward goal SLP Short Term Goal 4 (Week 1): Pt will increase functional cognitive skills for STM recall of information at sentence level after 5 minute time lapse to 80% accuracy given min cues. SLP Short Term Goal 4 - Progress (Week 1): Progressing toward goal  Skilled Therapeutic Interventions: Treatment session focused on addressing right sided labial weakness with placement of NMES in 4a placement at 8.5 mA with observed intermittent contraction of upper lip while perform oral motor-exercises.  Patient reports that labial weakness impacts both fit of hos dentures and speech.  During session patient required supervision level verbal cues to carryover use of speech intelligibility strategies.    FIM:  Comprehension Comprehension Mode: Auditory Comprehension: 6-Follows complex conversation/direction: With extra time/assistive device Expression Expression Mode: Verbal Expression: 5-Expresses complex 90% of the time/cues < 10% of the time Social  Interaction Social Interaction: 6-Interacts appropriately with others with medication or extra time (anti-anxiety, antidepressant). Problem Solving Problem Solving: 5-Solves basic 90% of the time/requires cueing < 10% of the time Memory Memory: 5-Recognizes or recalls 90% of the time/requires cueing < 10% of the time FIM - Eating Eating Activity: 5: Set-up assist for open containers  Pain Pain Assessment Pain Assessment: No/denies pain  Therapy/Group: Individual Therapy  Charlane Ferretti., CCC-SLP [phone]  [name] 02/08/2012, 5:03 PM

## 2012-02-09 ENCOUNTER — Inpatient Hospital Stay (HOSPITAL_COMMUNITY): Payer: Medicare Other | Admitting: Physical Therapy

## 2012-02-09 ENCOUNTER — Inpatient Hospital Stay (HOSPITAL_COMMUNITY): Payer: Medicare Other | Admitting: Speech Pathology

## 2012-02-09 ENCOUNTER — Inpatient Hospital Stay (HOSPITAL_COMMUNITY): Payer: Medicare Other

## 2012-02-09 ENCOUNTER — Inpatient Hospital Stay (HOSPITAL_COMMUNITY): Payer: Medicare Other | Admitting: *Deleted

## 2012-02-09 LAB — GLUCOSE, CAPILLARY
Glucose-Capillary: 155 mg/dL — ABNORMAL HIGH (ref 70–99)
Glucose-Capillary: 134 mg/dL — ABNORMAL HIGH (ref 70–99)

## 2012-02-09 NOTE — Progress Notes (Signed)
Physical Therapy Session Note  Patient Details  Name: Tyler [name] MRN: [id number] Date of Birth: 1929/05/01  Today's Date: 02/09/2012 Time: 1530-1600 Time Calculation (min): 30 min  Short Term Goals: Week 1:  PT Short Term Goal 1 (Week 1): pt will perform all bed mobility with supervision PT Short Term Goal 1 - Progress (Week 1): Met PT Short Term Goal 2 (Week 1): pt will basic transfer L and R with min assist PT Short Term Goal 2 - Progress (Week 1): Met PT Short Term Goal 4 (Week 1): pt will perform gait x 150 with min assist PT Short Term Goal 4 - Progress (Week 1): Met PT Short Term Goal 5 (Week 1): pt will perform dynamic standing activity x 5 minutes with min assist PT Short Term Goal 5 - Progress (Week 1): Met  Skilled Therapeutic Interventions/Progress Updates:   Patient's wife present; discussed home set up with wife and stairs between kitchen<>den.  Had patient demonstrate stair negotiation up and down 4 stairs (6" tall) x 2 reps without UE support on rails or SPC but with supervision-min A with step to technique ascending with LLE and descending with RLE; patient reports feeling more stable and confident with stabilization from therapist; discussed with patient placing hand on wall to provide some external support or having wife have grab bar or rail installed in wall to increase safety and decrease falls risk for patient.  Also had patient demonstrate transfer in/out of simulated [facility]; patient first entered car by standing on RLE and stepping LLE into car and then sitting on seat with supervision; discussed with patient risk of LOB or falling when standing on one leg (affected LE) and encouraged patient to sit on seat first and then place LE into car which patient return demonstrated with supervision and verbalized understanding of falls risk.  Patient performed gait on unit x 100' x 2 with [facility] and supervision with no LOB even during conversation.   Therapy  Documentation Precautions:  Precautions Precautions: Fall Restrictions Weight Bearing Restrictions: No Vital Signs: Therapy Vitals Temp: 97.8 F (36.6 C) Temp src: Oral Pulse Rate: 72  Resp: 18  BP: 134/52 mmHg Patient Position, if appropriate: Sitting Oxygen Therapy SpO2: 98 % O2 Device: None (Room air) Pain: Pain Assessment Pain Assessment: No/denies pain Locomotion : Ambulation Ambulation/Gait Assistance: 5: Supervision   See FIM for current functional status  Therapy/Group: Individual Therapy  [name] 02/09/2012, 4:33 PM

## 2012-02-09 NOTE — Progress Notes (Signed)
Occupational Therapy Session Note  Patient Details  Name: Tyler [name] MRN: [id number] Date of Birth: [birth year]  Today's Date: 02/09/2012 Time: 0930-1030  Time Calculation (min): 60 min   Short Term Goals: Week 1:  OT Short Term Goal 1 (Week 1): Patient will be instructed on HEP. OT Short Term Goal 2 (Week 1): Patient will increase awareness of RUE with use of compensatory techniques and Min verbal cues. OT Short Term Goal 3 (Week 1): Patient will increase RUE FMC to increase functional performance during grooming tasks.  Skilled Therapeutic Interventions/Progress Updates:   ADL re-training session completed in shower this AM with focus on hemi-dressing, ADL performance, functional transfer, standing balance, and safety awareness. Pt walked into bathroom with walker at Supervision level. Once pt reached incline at walk-in shower he did not pick up his right foot high enough and stumbled. Pt needed vc's to remember to pick right foot up high from the floor when walking.  Pt bathed and dressed at supervision level.  Pt stood to brush teeth, apply deodorant and donn t-shirt. No LOB while standing at sink.    Therapy Documentation Precautions:  Precautions Precautions: Fall Restrictions Weight Bearing Restrictions: No Pain: Pain Assessment Pain Assessment: No/denies pain Pain Score: 0-No pain  See FIM for current functional status  Therapy/Group: Individual Therapy  Limmie Patricia, OTR/L 02/09/2012, 11:36 AM

## 2012-02-09 NOTE — Progress Notes (Signed)
Speech Language Pathology Daily Session Note  Patient Details  Name: Tyler [name] MRN: [id number] Date of Birth: 11-30-1928  Today's Date: 02/09/2012 Time: 1530-1600 Time Calculation (min): 30 min  Short Term Goals: Week 1: SLP Short Term Goal 1 (Week 1): Pt will improve speech intelligibility by completing 10 reps of oral motor exercises for increased ROM, strength, and coordination for functional verbal communication SLP Short Term Goal 1 - Progress (Week 1): Progressing toward goal SLP Short Term Goal 2 (Week 1): Pt will increase functional speech production for verbalizing and using compensatory strategies to 80% accuracy given mod cues. SLP Short Term Goal 2 - Progress (Week 1): Progressing toward goal SLP Short Term Goal 3 (Week 1): Pt will improve functional thought organization and reasoning skills by completing tasks of increasing complexity with 75% accuracy given mod cues. SLP Short Term Goal 3 - Progress (Week 1): Progressing toward goal SLP Short Term Goal 4 (Week 1): Pt will increase functional cognitive skills for STM recall of information at sentence level after 5 minute time lapse to 80% accuracy given min cues. SLP Short Term Goal 4 - Progress (Week 1): Progressing toward goal  Skilled Therapeutic Interventions: Treatment session focused on addressing speech intelligibility and carryover of home program.  Wife present for education today and reported observed improvement in intelligibility; however, not yet at baseline.  SLP facilitated session with mod assist verbal cues to utilize over articulation to increase right labial movement during conversational speech.  Patient able to recall and return demonstrion of oral motor exercises with cue x1.    FIM:  Comprehension Comprehension Mode: Auditory Comprehension: 6-Follows complex conversation/direction: With extra time/assistive device Expression Expression Mode: Verbal Expression: 5-Expresses complex 90% of the time/cues  < 10% of the time Social Interaction Social Interaction: 6-Interacts appropriately with others with medication or extra time (anti-anxiety, antidepressant). Problem Solving Problem Solving: 5-Solves complex 90% of the time/cues < 10% of the time Memory Memory: 5-Requires cues to use assistive device  Pain Pain Assessment Pain Assessment: No/denies pain  Therapy/Group: Individual Therapy  Charlane Ferretti., CCC-SLP [phone]  Soliana Kitko 02/09/2012, 4:31 PM

## 2012-02-09 NOTE — Progress Notes (Signed)
Physical Therapy Session Note  Patient Details  Name: Tyler [name] MRN: [id number] Date of Birth: [birth year]  Today's Date: 02/09/2012 Time: 13:00-14:00  Time Calculation (min): 60 min  Short Term Goals: Week 1:  PT Short Term Goal 1 (Week 1): pt will perform all bed mobility with supervision PT Short Term Goal 1 - Progress (Week 1): Met PT Short Term Goal 2 (Week 1): pt will basic transfer L and R with min assist PT Short Term Goal 2 - Progress (Week 1): Met PT Short Term Goal 4 (Week 1): pt will perform gait x 150 with min assist PT Short Term Goal 4 - Progress (Week 1): Met PT Short Term Goal 5 (Week 1): pt will perform dynamic standing activity x 5 minutes with min assist PT Short Term Goal 5 - Progress (Week 1): Met Week 2:  PT Short Term Goal 1 (Week 2): week 2 goals = LTG, overall Mod I except S for community gait and Min A stairs x5  Skilled Therapeutic Interventions/Progress Updates:  Pt making excellent progress towards LTG and has met 4/4 STG. Pt continues to require Min A for gait with/wihtout device due to RLE weakness and balance deficits, with decreased righting reactions. Pt also with decreased gait speed, indicating increased risk of falls.  Pt will continue to benefit from inpatient PT to address remaining impairments and return home safely with decreased falls risk.   Tx focused on gait training with SPC, stairs with cane for in home mobility, and dynamic balance tasks.   Pt ambulated 2x150, 2x200' with Min A and no device and with SPC on tile for first trial with no device, and on carpet for remaining trials to simulate home environment and challenge balance. Pt walking with guarded gait using no device, but became more comfortable with SPC after practice for balance, but not weight-bearing. Pt carried weighted object in controlled environment with cane.   Gait speed = 0.625 m/s with cane in controlled environment.   Gait training x10 stairs with SPC only and Min A  for steadying with cues for gait pattern with cane. Pt has occasasionaly R toe catch, but able to correct with Min A.   Dynamic balance on carpet: -  agility ladder with SPC forward, retro walking, and side stepping x2 each condition with most difficulty retro walking with up to Mod A for steadying. Pt had 1 significant LOB, needing Max A to recover while turning to complete ladder.  - gait with no device tossing/catching ball to self on carpet x200' with min A - find and carry weight balls at various heights around room, including floor and overhead with Min A       Therapy Documentation Precautions:  Precautions Precautions: Fall Restrictions Weight Bearing Restrictions: No   Locomotion : Ambulation Ambulation/Gait Assistance: 4: Min guard   See FIM for current functional status  Therapy/Group: Individual Therapy  Virl Cagey, PT 02/09/2012, 1:45 PM

## 2012-02-09 NOTE — Progress Notes (Signed)
Patient ID: Tyler [name], male   DOB: [birth year], 78 y.o.   MRN: [id number]  Tyler [name] is a 78 y.o. right-handed male with history of hypertension, chronic renal insufficiency with baseline creatinine 1.7. Admitted 01/28/2012 with right sided weakness and slurred speech. MRI of the brain showed acute nonhemorrhagic infarct posterior left lenticular nucleus extending into the posterior aspect of the left corona radiata. MRA of the head with intracranial atherosclerotic type changes as well 1.4 mm aneurysm posterior lateral aspect left internal carotid artery. Echocardiogram with ejection fraction of 70% and grade 1 diastolic dysfunction. Carotid Dopplers with no ICA stenosis. Patient did not receive TPA. Neurology services consulted placed on Plavix therapy for stroke prophylaxis.  Dr. Darrick Penna vascular surgery notified in regards to small aneurysm identified on MRA advised no surgical intervention or further workup   Subjective/Complaints: Can I go home 10/11 or 10/12? Review of Systems  Neurological: Positive for focal weakness.  All other systems reviewed and are negative.   Objective: Vital Signs: Blood pressure 144/71, pulse 66, temperature 97.8 F (36.6 C), temperature source Oral, resp. rate 17, height 5\' 10"  (1.778 m), weight 81.5 kg (179 lb 10.8 oz), SpO2 97.00%. No results found. Results for orders placed during the hospital encounter of 02/02/12 (from the past 72 hour(s))  GLUCOSE, CAPILLARY     Status: Abnormal   Collection Time   02/05/12 11:28 AM      Component Value Range Comment   Glucose-Capillary 137 (*) 70 - 99 mg/dL    Comment 1 Notify RN     GLUCOSE, CAPILLARY     Status: Abnormal   Collection Time   02/05/12  4:42 PM      Component Value Range Comment   Glucose-Capillary 173 (*) 70 - 99 mg/dL   GLUCOSE, CAPILLARY     Status: Abnormal   Collection Time   02/05/12  9:05 PM      Component Value Range Comment   Glucose-Capillary 165 (*) 70 - 99 mg/dL   GLUCOSE,  CAPILLARY     Status: Abnormal   Collection Time   02/06/12  9:08 AM      Component Value Range Comment   Glucose-Capillary 197 (*) 70 - 99 mg/dL   GLUCOSE, CAPILLARY     Status: Abnormal   Collection Time   02/06/12 11:02 AM      Component Value Range Comment   Glucose-Capillary 186 (*) 70 - 99 mg/dL   GLUCOSE, CAPILLARY     Status: Abnormal   Collection Time   02/06/12  4:32 PM      Component Value Range Comment   Glucose-Capillary 121 (*) 70 - 99 mg/dL   GLUCOSE, CAPILLARY     Status: Normal   Collection Time   02/06/12  9:08 PM      Component Value Range Comment   Glucose-Capillary 92  70 - 99 mg/dL   GLUCOSE, CAPILLARY     Status: Normal   Collection Time   02/07/12  6:36 AM      Component Value Range Comment   Glucose-Capillary 93  70 - 99 mg/dL   GLUCOSE, CAPILLARY     Status: Abnormal   Collection Time   02/07/12 11:57 AM      Component Value Range Comment   Glucose-Capillary 154 (*) 70 - 99 mg/dL    Comment 1 Notify RN     GLUCOSE, CAPILLARY     Status: Abnormal   Collection Time   02/07/12  4:14 PM  Component Value Range Comment   Glucose-Capillary 128 (*) 70 - 99 mg/dL   GLUCOSE, CAPILLARY     Status: Abnormal   Collection Time   02/07/12  8:28 PM      Component Value Range Comment   Glucose-Capillary 174 (*) 70 - 99 mg/dL    Comment 1 Notify RN     GLUCOSE, CAPILLARY     Status: Abnormal   Collection Time   02/08/12  7:23 AM      Component Value Range Comment   Glucose-Capillary 110 (*) 70 - 99 mg/dL      HEENT: normal Cardio: RRR Resp: CTA B/L GI: BS positive Extremity:  No Edema Skin:   Other R great toe onychogryphosis, healed incision R shoulder Neuro: Alert/Oriented, Cranial Nerve Abnormalities R central 7, Normal Sensory, Abnormal Motor 4-/5 in RUE, 4/5 in RLE, Abnormal FMC Ataxic/ dec FMC and Dysarthric Musc/Skel:  Other no foot or ankle deformities Pedal and post tib pulses normal, skin warm and dry Mood and affect are  appropriate  Assessment/Plan: 1. Functional deficits secondary to L basal ganglia and corona radiata infarct, RHP, R fine motor and coordination deficits which require 3+ hours per day of interdisciplinary therapy in a comprehensive inpatient rehab setting. Physiatrist is providing close team supervision and 24 hour management of active medical problems listed below. Physiatrist and rehab team continue to assess barriers to discharge/monitor patient progress toward functional and medical goals. FIM: FIM - Bathing Bathing Steps Patient Completed: Chest;Right Arm;Left Arm;Abdomen;Front perineal area;Buttocks;Right upper leg;Left upper leg;Right lower leg (including foot);Left lower leg (including foot) Bathing: 5: Supervision: Safety issues/verbal cues  FIM - Upper Body Dressing/Undressing Upper body dressing/undressing steps patient completed: Thread/unthread right sleeve of pullover shirt/dresss;Thread/unthread left sleeve of pullover shirt/dress;Put head through opening of pull over shirt/dress;Pull shirt over trunk Upper body dressing/undressing: 5: Supervision: Safety issues/verbal cues FIM - Lower Body Dressing/Undressing Lower body dressing/undressing steps patient completed: Thread/unthread right underwear leg;Thread/unthread left underwear leg;Pull underwear up/down;Thread/unthread right pants leg;Thread/unthread left pants leg;Pull pants up/down;Don/Doff right sock;Don/Doff left sock;Don/Doff right shoe;Don/Doff left shoe;Fasten/unfasten right shoe;Fasten/unfasten left shoe Lower body dressing/undressing: 5: Set-up assist to: Don/Doff TED stocking  FIM - Toileting Toileting steps completed by patient: Adjust clothing prior to toileting;Performs perineal hygiene;Adjust clothing after toileting Toileting Assistive Devices: Grab bar or rail for support Toileting: 5: Supervision: Safety issues/verbal cues  FIM - Diplomatic Services operational officer Devices: Grab bars;Walker Toilet  Transfers: 4-To toilet/BSC: Min A (steadying Pt. > 75%)  FIM - Bed/Chair Transfer Bed/Chair Transfer Assistive Devices: Therapist, occupational: 4: Chair or W/C > Bed: Min A (steadying Pt. > 75%)  FIM - Locomotion: Wheelchair Distance: 25 Locomotion: Wheelchair: 1: Total Assistance/staff pushes wheelchair (Pt<25%) FIM - Locomotion: Ambulation Locomotion: Ambulation Assistive Devices: Designer, industrial/product Ambulation/Gait Assistance: 4: Min assist Locomotion: Ambulation: 4: Travels 150 ft or more with minimal assistance (Pt.>75%)  Comprehension Comprehension Mode: Auditory Comprehension: 6-Follows complex conversation/direction: With extra time/assistive device  Expression Expression Mode: Verbal Expression: 5-Expresses complex 90% of the time/cues < 10% of the time  Social Interaction Social Interaction: 6-Interacts appropriately with others with medication or extra time (anti-anxiety, antidepressant).  Problem Solving Problem Solving: 5-Solves basic 90% of the time/requires cueing < 10% of the time  Memory Memory Assistive Devices: Other (Comment) (written reminders) Memory: 5-Recognizes or recalls 90% of the time/requires cueing < 10% of the time  Medical Problem List and Plan:  1. Thrombotic left lenticular nucleus-corona radiata infarct  2. DVT Prophylaxis/Anticoagulation: SCDs. Monitor for signs  of DVT  3. Mood: Pt is generally up beat and motivated. Team to provide egosupport as appropriate  4. Neuropsych: This patient is capable of making decisions on his/her own behalf.  5. Hypertension. Lopressor 12.5 mg twice a day, Cozaar 50 mg daily. Monitor with increased mobility  6. Diabetes mellitus. Hemoglobin A1c 6.7. Amaryl 4 mg daily. Check blood sugars a.c. and at bedtime  7. Hyperlipidemia. Lovaza and Zocor  8. Chronic renal insufficiency. Baseline creatinine 1.6. Followup labs and encourage adequate fluid intake  LOS (Days) 6 A FACE TO FACE EVALUATION WAS  PERFORMED  Laikyn Gewirtz E 02/08/2012, 8:19 AM

## 2012-02-10 ENCOUNTER — Inpatient Hospital Stay (HOSPITAL_COMMUNITY): Payer: Medicare Other

## 2012-02-10 ENCOUNTER — Inpatient Hospital Stay (HOSPITAL_COMMUNITY): Payer: Medicare Other | Admitting: Speech Pathology

## 2012-02-10 ENCOUNTER — Inpatient Hospital Stay (HOSPITAL_COMMUNITY): Payer: Medicare Other | Admitting: Physical Therapy

## 2012-02-10 LAB — GLUCOSE, CAPILLARY: Glucose-Capillary: 121 mg/dL — ABNORMAL HIGH (ref 70–99)

## 2012-02-10 NOTE — Patient Care Conference (Signed)
Inpatient RehabilitationTeam Conference Note Date: 02/10/2012   Time: 11:15 AM    Patient Name: Tyler [name]      Medical Record Number: [id number]  Date of Birth: 05-12-1928 Sex: Male         Room/Bed: 4144/4144-01 Payor Info: Payor: Advertising copywriter MEDICARE  Plan: [employer] MEDICARE COMPLETE  Product Type: *No Product type*     Admitting Diagnosis: LT CVA  Admit Date/Time:  02/02/2012  3:33 PM Admission Comments: No comment available   Primary Diagnosis:  Stroke Principal Problem: Stroke  Patient Active Problem List   Diagnosis Date Noted  . Stroke 02/02/2012  . Speech abnormality 01/28/2012  . Cerebral embolism with cerebral infarction 01/28/2012  . CVA (cerebral infarction) 01/28/2012  . DM2 (diabetes mellitus, type 2) 01/28/2012  . Hyperlipidemia 01/28/2012  . History of prostate cancer 01/28/2012    Expected Discharge Date: Expected Discharge Date: 02/13/12  Team Members Present: Physician: Dr. Claudette Laws Social Worker Present: Dossie Der, LCSW Nurse Present: Other (comment) Charisse March Hicks-RN) PT Present: [name], Lillie Columbia, PT OT Present: Bretta Bang, Verlene Mayer, OT SLP Present: Fae Pippin, SLP Other (Discipline and Name): Charolette Child Coordinator     Current Status/Progress Goal Weekly Team Focus  Medical   , poor safety awareness  improve safety awareness and balance and compensation  educate pt and caregiver   Bowel/Bladder   Continent of bowel and bladder/use urinal. LBM 02/09/2012  Remain continent of bowel and bladder.  Continet of bowel and bladder.   Swallow/Nutrition/ Hydration             ADL's   Pt is currently Supervision for everything (bathing/dressing/toilet transfer/grooming) Min Assist at times for shower transfer if he does not pick right foot up completely when approaching incline.   Mod I overall. May downgrade due to left side neglect.  Left side awareness, safety awareness, A/ROM RUE, FMC, GMC, family education,  functional transfers   Mobility   supervision transfers, min assist gait with Rw, Berg score = 35; slow progress with balance, R foot clearance  mod I overall except for supervision car transfer and community gait; min assist 5 stairs  balance, DME, gait training, possible Toe Up AFO   Communication   supervision-min assist  min assist  increase accuracy of consonant porductions, NMES   Safety/Cognition/ Behavioral Observations  supervision-min assist  supervision-min assist  increase self monitoring and correcting   Pain   Denies pain  Free of pain  Monitor and assess pain q shift   Skin   n/a  n/a  n/a      *See Interdisciplinary Assessment and Plan and progress notes for long and short-term goals  Barriers to Discharge: see above, wife unwilling to take time off work    Possible Resolutions to Barriers:  modify home environment to improve safety, reduce fall risk    Discharge Planning/Teaching Needs:  Home with wife and friends to assist.  Doing very well in therapies      Team Discussion:  Some safety issues-downgraded goals to supervision level.  Divided attention poor-wife here yesterday for family education.  Discuss with wife modifications to make for pt to be safer at hoe while she is at work  Revisions to Treatment Plan:  Moved up discharge date per pt's request   Continued Need for Acute Rehabilitation Level of Care: The patient requires daily medical management by a physician with specialized training in physical medicine and rehabilitation for the following conditions: Daily direction of a multidisciplinary  physical rehabilitation program to ensure safe treatment while eliciting the highest outcome that is of practical value to the patient.: Yes Daily medical management of patient stability for increased activity during participation in an intensive rehabilitation regime.: Yes Daily analysis of laboratory values and/or radiology reports with any subsequent need for  medication adjustment of medical intervention for : Neurological problems  Chanequa Spees, Lemar Livings 02/10/2012, 12:42 PM

## 2012-02-10 NOTE — Progress Notes (Signed)
Physical Therapy Session Note  Patient Details  Name: Tyler [name] MRN: [id number] Date of Birth: April 26, 1929  Today's Date: 02/10/2012 Time: 4098-1191 Time Calculation (min): 30 min  Short Term Goals: Week 2:  PT Short Term Goal 1 (Week 2): week 2 goals = LTG, overall Mod I excep tS for community gai tand Min A stairs x5  Skilled Therapeutic Interventions/Progress Updates:   Patient's wife present; discussed with wife pt D/C date; wife would like to pick up patient Friday pm vs. Saturday am; cleared with primary PT, social work and PA.  Wife states that they will be installing rail inside house between den and kitchen for increased safety.  Discussed with wife supervision goals for showering and wife agreed to supervise patient with pm showers after she returns home from work.  Discussed with wife attending a pm OT bathing and dressing education session Friday prior to D/C; wife agreed; discussed set up of bathroom with patient and wife.  Assessed patient's gait with foot up brace to assist with foot clearance while still allowing patient to initiate ankle DF; performed gait in controlled environment x 150' over tile and carpet and up and down 3 stairs with one rail and SPC with supervision overall and no episodes of toe drag even during conversation; primary PT to order foot up brace for home.       Therapy Documentation Precautions:  Precautions Precautions: Fall Restrictions Weight Bearing Restrictions: No  No c/o pain  See FIM for current functional status  Therapy/Group: Individual Therapy  [name] [facility] 02/10/2012, 4:17 PM

## 2012-02-10 NOTE — Plan of Care (Signed)
Problem: RH Balance Goal: LTG Patient will maintain dynamic standing with ADLs (OT) LTG: Patient will maintain dynamic standing balance with assist during activities of daily living (OT)  downgraded  Problem: RH Toilet Transfers Goal: LTG Patient will perform toilet transfers w/assist (OT) LTG: Patient will perform toilet transfers with assist, with/without cues using equipment (OT)  downgraded  Problem: RH Tub/Shower Transfers Goal: LTG Patient will perform tub/shower transfers w/assist (OT) LTG: Patient will perform tub/shower transfers with assist, with/without cues using equipment (OT)  downgraded

## 2012-02-10 NOTE — Progress Notes (Signed)
Speech Language Pathology Daily Session Note  Patient Details  Name: Tyler [name] MRN: [id number] Date of Birth: 05-25-1928  Today's Date: 02/10/2012 Time: 1305-1400 Time Calculation (min): 55 min  Short Term Goals: Week 1: SLP Short Term Goal 1 (Week 1): Pt will improve speech intelligibility by completing 10 reps of oral motor exercises for increased ROM, strength, and coordination for functional verbal communication SLP Short Term Goal 1 - Progress (Week 1): Progressing toward goal SLP Short Term Goal 2 (Week 1): Pt will increase functional speech production for verbalizing and using compensatory strategies to 80% accuracy given mod cues. SLP Short Term Goal 2 - Progress (Week 1): Progressing toward goal SLP Short Term Goal 3 (Week 1): Pt will improve functional thought organization and reasoning skills by completing tasks of increasing complexity with 75% accuracy given mod cues. SLP Short Term Goal 3 - Progress (Week 1): Progressing toward goal SLP Short Term Goal 4 (Week 1): Pt will increase functional cognitive skills for STM recall of information at sentence level after 5 minute time lapse to 80% accuracy given min cues. SLP Short Term Goal 4 - Progress (Week 1): Progressing toward goal  Skilled Therapeutic Interventions: Treatment session focused on addressing right sided labial weakness with placement of NMES in 4a placement at 8.5 mA with observed intermittent contraction of upper lip while performing oral motor-exercises. Patient reports that labial weakness impacts both fit of his dentures and speech intelligibility. SLP also facilitated session with categorical naming task and mod assist semantic cues to utilize word finding strategies.  Overall, during session patient required supervision level verbal cues to carryover use of speech intelligibility strategies.    FIM:  Comprehension Comprehension Mode: Auditory Comprehension: 6-Follows complex conversation/direction: With  extra time/assistive device Expression Expression Mode: Verbal Expression: 6-Expresses complex ideas: With extra time/assistive device Social Interaction Social Interaction: 6-Interacts appropriately with others with medication or extra time (anti-anxiety, antidepressant). Problem Solving Problem Solving: 5-Solves complex 90% of the time/cues < 10% of the time Memory Memory: 6-More than reasonable amt of time  Pain Pain Assessment Pain Assessment: No/denies pain  Therapy/Group: Individual Therapy  Charlane Ferretti., CCC-SLP [phone]  Yzabella Crunk 02/10/2012, 5:12 PM

## 2012-02-10 NOTE — Progress Notes (Addendum)
Physical Therapy Session Note  Patient Details  Name: Tyler [name] MRN: [id number] Date of Birth: Sep 29, 1928  Today's Date: 02/10/2012 Time: 4098-1191 Time Calculation (min): 60 min  Short Term Goals: Week 2:  PT Short Term Goal 1 (Week 2): week 2 goals = LTG, overall Mod I excep tS for community gai tand Min A stairs x5  Skilled Therapeutic Interventions/Progress Updates:  Discussed home situation- pt stated that at home, he sometimes has to get up OOB at night to urinate.  Discussed safety issues around this: lighting, barefoot, possible use of urinal, etc.  Gait on level tile with RW x 150' with supervision, pt demonstrating good R foot clearance and R foot strike.  Gait with RW on carpet in ADL apt with 1 LOB backwards as he turned to sit on bed.  Bed mobility practice; pt needed VCs for supine> sit with typical pattern of rolling.  Activity in sitting placing 2 items on hangers, then ambulating 15' to hang them up; with S.  Neuromuscular re-education via tactile cues, VCs, demo, in sitting with feet and back unsupported,  R or LLE crossed, facilitating trunk shortening/lengthening with wt shifting to facilitate trunk response for balance reactions.  Berg balance test, score 35= nearly 100% risk of falls.  Neuromuscular re-education during gait without AD, facilitating R arm swing and trunk rotation with LLE swing phase, requiring constant manual cues to activate RUE, x 200' with min assist for 1 LOB as R foot did not clear floor.  PT spoke with Carolyne Fiscal about R Foot Up brace; have requested order from MD.       Therapy Documentation Precautions:  Precautions Precautions: Fall Restrictions Weight Bearing Restrictions: No   Pain: Pain Assessment Pain Assessment: No/denies pain   Locomotion : Ambulation Ambulation/Gait Assistance: Other (comment) (no AD) - min assist; supervision x 150' with RW Trunk/Postural Assessment :- limited R trunk rotation during gait; VCs  necessary to decrease thoracic flexion during gait    Balance: Balance Balance Assessed: Yes Standardized Balance Assessment Standardized Balance Assessment: Berg Balance Test Berg Balance Test Sit to Stand: Able to stand  independently using hands Standing Unsupported: Able to stand 2 minutes with supervision Sitting with Back Unsupported but Feet Supported on Floor or Stool: Able to sit safely and securely 2 minutes Stand to Sit: Controls descent by using hands Transfers: Able to transfer safely, definite need of hands Standing Unsupported with Eyes Closed: Able to stand 10 seconds with supervision Standing Ubsupported with Feet Together: Able to place feet together independently and stand for 1 minute with supervision From Standing, Reach Forward with Outstretched Arm: Can reach forward >5 cm safely (2") From Standing Position, Pick up Object from Floor: Able to pick up shoe, needs supervision From Standing Position, Turn to Look Behind Over each Shoulder: Looks behind from both sides and weight shifts well Turn 360 Degrees: Needs close supervision or verbal cueing Standing Unsupported, Alternately Place Feet on Step/Stool: Able to complete >2 steps/needs minimal assist Standing Unsupported, One Foot in Front: Needs help to step but can hold 15 seconds Standing on One Leg: Tries to lift leg/unable to hold 3 seconds but remains standing independently Total Score: 35        See FIM for current functional status  Therapy/Group: Individual Therapy  Kortni Hasten 02/10/2012, 9:13 AM

## 2012-02-10 NOTE — Progress Notes (Signed)
Patient ID: RUTHERFORD ALARIE, male   DOB: 06-22-1928, 78 y.o.   MRN: [id number]  ZYRON DEELEY is a 78 y.o. right-handed male with history of hypertension, chronic renal insufficiency with baseline creatinine 1.7. Admitted 01/28/2012 with right sided weakness and slurred speech. MRI of the brain showed acute nonhemorrhagic infarct posterior left lenticular nucleus extending into the posterior aspect of the left corona radiata. MRA of the head with intracranial atherosclerotic type changes as well 1.4 mm aneurysm posterior lateral aspect left internal carotid artery. Echocardiogram with ejection fraction of 70% and grade 1 diastolic dysfunction. Carotid Dopplers with no ICA stenosis. Patient did not receive TPA. Neurology services consulted placed on Plavix therapy for stroke prophylaxis.  Dr. Darrick Penna vascular surgery notified in regards to small aneurysm identified on MRA advised no surgical intervention or further workup   Subjective/Complaints: My feet were bluish for a couple minutes the other day.  No pain no SOB, no recurrence Review of Systems  Neurological: Positive for focal weakness.  All other systems reviewed and are negative.   Objective: Vital Signs: Blood pressure 144/71, pulse 66, temperature 97.8 F (36.6 C), temperature source Oral, resp. rate 17, height 5\' 10"  (1.778 m), weight 81.5 kg (179 lb 10.8 oz), SpO2 97.00%. No results found. Results for orders placed during the hospital encounter of 02/02/12 (from the past 72 hour(s))  GLUCOSE, CAPILLARY     Status: Abnormal   Collection Time   02/05/12 11:28 AM      Component Value Range Comment   Glucose-Capillary 137 (*) 70 - 99 mg/dL    Comment 1 Notify RN     GLUCOSE, CAPILLARY     Status: Abnormal   Collection Time   02/05/12  4:42 PM      Component Value Range Comment   Glucose-Capillary 173 (*) 70 - 99 mg/dL   GLUCOSE, CAPILLARY     Status: Abnormal   Collection Time   02/05/12  9:05 PM      Component Value Range Comment   Glucose-Capillary 165 (*) 70 - 99 mg/dL   GLUCOSE, CAPILLARY     Status: Abnormal   Collection Time   02/06/12  9:08 AM      Component Value Range Comment   Glucose-Capillary 197 (*) 70 - 99 mg/dL   GLUCOSE, CAPILLARY     Status: Abnormal   Collection Time   02/06/12 11:02 AM      Component Value Range Comment   Glucose-Capillary 186 (*) 70 - 99 mg/dL   GLUCOSE, CAPILLARY     Status: Abnormal   Collection Time   02/06/12  4:32 PM      Component Value Range Comment   Glucose-Capillary 121 (*) 70 - 99 mg/dL   GLUCOSE, CAPILLARY     Status: Normal   Collection Time   02/06/12  9:08 PM      Component Value Range Comment   Glucose-Capillary 92  70 - 99 mg/dL   GLUCOSE, CAPILLARY     Status: Normal   Collection Time   02/07/12  6:36 AM      Component Value Range Comment   Glucose-Capillary 93  70 - 99 mg/dL   GLUCOSE, CAPILLARY     Status: Abnormal   Collection Time   02/07/12 11:57 AM      Component Value Range Comment   Glucose-Capillary 154 (*) 70 - 99 mg/dL    Comment 1 Notify RN     GLUCOSE, CAPILLARY     Status: Abnormal  Collection Time   02/07/12  4:14 PM      Component Value Range Comment   Glucose-Capillary 128 (*) 70 - 99 mg/dL   GLUCOSE, CAPILLARY     Status: Abnormal   Collection Time   02/07/12  8:28 PM      Component Value Range Comment   Glucose-Capillary 174 (*) 70 - 99 mg/dL    Comment 1 Notify RN     GLUCOSE, CAPILLARY     Status: Abnormal   Collection Time   02/08/12  7:23 AM      Component Value Range Comment   Glucose-Capillary 110 (*) 70 - 99 mg/dL      HEENT: normal Cardio: RRR Resp: CTA B/L GI: BS positive Extremity:  No Edema Skin:   Other R great toe onychogryphosis, healed incision R shoulder Neuro: Alert/Oriented, Cranial Nerve Abnormalities R central 7, Normal Sensory, Abnormal Motor 4-/5 in RUE, 4/5 in RLE, Abnormal FMC Ataxic/ dec FMC and Dysarthric Musc/Skel:  Other no foot or ankle deformities Pedal and post tib pulses normal, skin warm  and dry Mood and affect are appropriate  Assessment/Plan: 1. Functional deficits secondary to L basal ganglia and corona radiata infarct, RHP, R fine motor and coordination deficits which require 3+ hours per day of interdisciplinary therapy in a comprehensive inpatient rehab setting. Physiatrist is providing close team supervision and 24 hour management of active medical problems listed below. Physiatrist and rehab team continue to assess barriers to discharge/monitor patient progress toward functional and medical goals. FIM: FIM - Bathing Bathing Steps Patient Completed: Chest;Right Arm;Left Arm;Abdomen;Front perineal area;Buttocks;Right upper leg;Left upper leg;Right lower leg (including foot);Left lower leg (including foot) Bathing: 5: Supervision: Safety issues/verbal cues  FIM - Upper Body Dressing/Undressing Upper body dressing/undressing steps patient completed: Thread/unthread right sleeve of pullover shirt/dresss;Thread/unthread left sleeve of pullover shirt/dress;Put head through opening of pull over shirt/dress;Pull shirt over trunk Upper body dressing/undressing: 5: Supervision: Safety issues/verbal cues FIM - Lower Body Dressing/Undressing Lower body dressing/undressing steps patient completed: Thread/unthread right underwear leg;Thread/unthread left underwear leg;Pull underwear up/down;Thread/unthread right pants leg;Thread/unthread left pants leg;Pull pants up/down;Don/Doff right sock;Don/Doff left sock;Don/Doff right shoe;Don/Doff left shoe;Fasten/unfasten right shoe;Fasten/unfasten left shoe Lower body dressing/undressing: 5: Set-up assist to: Don/Doff TED stocking  FIM - Toileting Toileting steps completed by patient: Adjust clothing prior to toileting;Performs perineal hygiene;Adjust clothing after toileting Toileting Assistive Devices: Grab bar or rail for support Toileting: 5: Supervision: Safety issues/verbal cues  FIM - Diplomatic Services operational officer  Devices: Grab bars;Walker Toilet Transfers: 4-To toilet/BSC: Min A (steadying Pt. > 75%)  FIM - Bed/Chair Transfer Bed/Chair Transfer Assistive Devices: Therapist, occupational: 4: Chair or W/C > Bed: Min A (steadying Pt. > 75%)  FIM - Locomotion: Wheelchair Distance: 25 Locomotion: Wheelchair: 1: Total Assistance/staff pushes wheelchair (Pt<25%) FIM - Locomotion: Ambulation Locomotion: Ambulation Assistive Devices: Designer, industrial/product Ambulation/Gait Assistance: 4: Min assist Locomotion: Ambulation: 4: Travels 150 ft or more with minimal assistance (Pt.>75%)  Comprehension Comprehension Mode: Auditory Comprehension: 6-Follows complex conversation/direction: With extra time/assistive device  Expression Expression Mode: Verbal Expression: 5-Expresses complex 90% of the time/cues < 10% of the time  Social Interaction Social Interaction: 6-Interacts appropriately with others with medication or extra time (anti-anxiety, antidepressant).  Problem Solving Problem Solving: 5-Solves basic 90% of the time/requires cueing < 10% of the time  Memory Memory Assistive Devices: Other (Comment) (written reminders) Memory: 5-Recognizes or recalls 90% of the time/requires cueing < 10% of the time  Medical Problem List and Plan:  1. Thrombotic  left lenticular nucleus-corona radiata infarct  2. DVT Prophylaxis/Anticoagulation: SCDs. Monitor for signs of DVT  3. Mood: Pt is generally up beat and motivated. Team to provide egosupport as appropriate  4. Neuropsych: This patient is capable of making decisions on his/her own behalf.  5. Hypertension. Lopressor 12.5 mg twice a day, Cozaar 50 mg daily. Monitor with increased mobility  Diastolic high this am but isolated reading-monitor 6. Diabetes mellitus. Hemoglobin A1c 6.7. Amaryl 4 mg daily. Check blood sugars a.c. and at bedtime  7. Hyperlipidemia. Lovaza and Zocor  8. Chronic renal insufficiency. Baseline creatinine 1.6. Followup labs and  encourage adequate fluid intake  LOS (Days) 6 A FACE TO FACE EVALUATION WAS PERFORMED  Marlita Keil E 02/08/2012, 8:19 AM

## 2012-02-10 NOTE — Progress Notes (Signed)
Social Work Patient ID: Tyler [name], male   DOB: 10-Apr-1929, 78 y.o.   MRN: [id number] Met with pt and wife to discuss team conference and discharge.  Wife wants to know if pt can leave Friday after therapies, since he will receive no therapies on Sat. Consulted with MD and dan-PA along with Audra-PT and all in agreement pt can be discharged Friday after therapies.  Will make discharge arrangements, ie DME and follow up. Both pleased with discharge and pt's progress.

## 2012-02-10 NOTE — Progress Notes (Addendum)
Occupational Therapy Weekly Progress Note  Patient Details  Name: Tyler [name] MRN: [id number] Date of Birth: 11-01-1928  Today's Date: 02/10/2012 Time: 0930-1030 Time Calculation (min): 60 min  Patient has met 3 of 3 short term goals.    Patient continues to demonstrate the following deficits: decreased awareness to left side, decreased safety awareness at times, decreased RUE strength and fine motor coordination and therefore will continue to benefit from skilled OT intervention to enhance overall performance with BADL.  Patient not progressing toward long term goals.  See goal revision..  Plan of care revisions: Right side neglect is not improving. Spoke with PT regarding Mod I overall for LTG. At this time, pt would not be safe at Mod I level for all LTGs. LTGs will be adjusted to a supervision level-Mod I level.  OT Short Term Goals Week 1:  OT Short Term Goal 1 (Week 1): Patient will be instructed on HEP. OT Short Term Goal 1 - Progress (Week 1): Met OT Short Term Goal 2 (Week 1): Patient will increase awareness of RUE with use of compensatory techniques and Min verbal cues. OT Short Term Goal 2 - Progress (Week 1): Met OT Short Term Goal 3 (Week 1): Patient will increase RUE FMC to increase functional performance during grooming tasks. OT Short Term Goal 3 - Progress (Week 1): Met Week 2:  OT Short Term Goal 1 (Week 2): STG = LTG  Skilled Therapeutic Interventions/Progress Updates:  ADL re-training session completed in shower this AM. Session with focus on right side neglect, safety awareness, and direction following. Pt continues to require vc's to pick up right foot when walking as he was walking into bathroom and stumbled over incline.   Coral Theraputty used to increase grip strength and pinch strength of RUE. Pt educated on HEP using Coral theraputty. 9 hole peg test administered. R hand: 1 min 3 sec L hand: 26 sec Pt practiced in hand manipulation and finger tip to palm task  with right hand using sponges. 78,29,56  Therapy Documentation Precautions:  Precautions Precautions: Fall Restrictions Weight Bearing Restrictions: No Pain: Pain Assessment Pain Assessment: No/denies pain  See FIM for current functional status  Therapy/Group: Individual Therapy  Limmie Patricia, OTR/L 02/10/2012, 11:06 AM

## 2012-02-10 NOTE — Progress Notes (Signed)
Social Work Patient ID: Tyler [name], male   DOB: Feb 14, 1929, 78 y.o.   MRN: [id number] Met with pt to inform of team conference progression toward goals and discharge 10/12.  Pt has requested discharge to be moved up due to [employer].  Wife was here yesterday to attend therapies and discussed with therapists safety concerns and ways to make him safer at home. Will meet with when she is here at 4;00 to answer any questions or concerns.  Work on discharge needs.

## 2012-02-11 ENCOUNTER — Inpatient Hospital Stay (HOSPITAL_COMMUNITY): Payer: Medicare Other | Admitting: Physical Therapy

## 2012-02-11 ENCOUNTER — Inpatient Hospital Stay (HOSPITAL_COMMUNITY): Payer: Medicare Other | Admitting: Speech Pathology

## 2012-02-11 ENCOUNTER — Inpatient Hospital Stay (HOSPITAL_COMMUNITY): Payer: Medicare Other

## 2012-02-11 LAB — GLUCOSE, CAPILLARY
Glucose-Capillary: 125 mg/dL — ABNORMAL HIGH (ref 70–99)
Glucose-Capillary: 123 mg/dL — ABNORMAL HIGH (ref 70–99)
Glucose-Capillary: 166 mg/dL — ABNORMAL HIGH (ref 70–99)
Glucose-Capillary: 159 mg/dL — ABNORMAL HIGH (ref 70–99)

## 2012-02-11 NOTE — Progress Notes (Signed)
Patient ID: Tyler [name], male   DOB: 10-04-1928, 78 y.o.   MRN: [id number]  Tyler [name] is a 78 y.o. right-handed male with history of hypertension, chronic renal insufficiency with baseline creatinine 1.7. Admitted 01/28/2012 with right sided weakness and slurred speech. MRI of the brain showed acute nonhemorrhagic infarct posterior left lenticular nucleus extending into the posterior aspect of the left corona radiata. MRA of the head with intracranial atherosclerotic type changes as well 1.4 mm aneurysm posterior lateral aspect left internal carotid artery. Echocardiogram with ejection fraction of 70% and grade 1 diastolic dysfunction. Carotid Dopplers with no ICA stenosis. Patient did not receive TPA. Neurology services consulted placed on Plavix therapy for stroke prophylaxis.  Dr. Darrick Penna vascular surgery notified in regards to small aneurysm identified on MRA advised no surgical intervention or further workup   Subjective/Complaints: PT rec foot up brace.  Discussed rec for 24/7 sup and no driving Review of Systems  Neurological: Positive for focal weakness.  All other systems reviewed and are negative.   Objective: Vital Signs: Blood pressure 146/78, pulse 70, temperature 97.8 F (36.6 C), temperature source Oral, resp. rate 17, height 5\' 10"  (1.778 m), weight 81.5 kg (179 lb 10.8 oz), SpO2 97.00%. No results found. Results for orders placed during the hospital encounter of 02/02/12 (from the past 72 hour(s))  GLUCOSE, CAPILLARY     Status: Abnormal   Collection Time   02/05/12 11:28 AM      Component Value Range Comment   Glucose-Capillary 137 (*) 70 - 99 mg/dL    Comment 1 Notify RN     GLUCOSE, CAPILLARY     Status: Abnormal   Collection Time   02/05/12  4:42 PM      Component Value Range Comment   Glucose-Capillary 173 (*) 70 - 99 mg/dL   GLUCOSE, CAPILLARY     Status: Abnormal   Collection Time   02/05/12  9:05 PM      Component Value Range Comment   Glucose-Capillary 165  (*) 70 - 99 mg/dL   GLUCOSE, CAPILLARY     Status: Abnormal   Collection Time   02/06/12  9:08 AM      Component Value Range Comment   Glucose-Capillary 197 (*) 70 - 99 mg/dL   GLUCOSE, CAPILLARY     Status: Abnormal   Collection Time   02/06/12 11:02 AM      Component Value Range Comment   Glucose-Capillary 186 (*) 70 - 99 mg/dL   GLUCOSE, CAPILLARY     Status: Abnormal   Collection Time   02/06/12  4:32 PM      Component Value Range Comment   Glucose-Capillary 121 (*) 70 - 99 mg/dL   GLUCOSE, CAPILLARY     Status: Normal   Collection Time   02/06/12  9:08 PM      Component Value Range Comment   Glucose-Capillary 92  70 - 99 mg/dL   GLUCOSE, CAPILLARY     Status: Normal   Collection Time   02/07/12  6:36 AM      Component Value Range Comment   Glucose-Capillary 93  70 - 99 mg/dL   GLUCOSE, CAPILLARY     Status: Abnormal   Collection Time   02/07/12 11:57 AM      Component Value Range Comment   Glucose-Capillary 154 (*) 70 - 99 mg/dL    Comment 1 Notify RN     GLUCOSE, CAPILLARY     Status: Abnormal   Collection Time  02/07/12  4:14 PM      Component Value Range Comment   Glucose-Capillary 128 (*) 70 - 99 mg/dL   GLUCOSE, CAPILLARY     Status: Abnormal   Collection Time   02/07/12  8:28 PM      Component Value Range Comment   Glucose-Capillary 174 (*) 70 - 99 mg/dL    Comment 1 Notify RN     GLUCOSE, CAPILLARY     Status: Abnormal   Collection Time   02/08/12  7:23 AM      Component Value Range Comment   Glucose-Capillary 110 (*) 70 - 99 mg/dL      HEENT: normal Cardio: RRR Resp: CTA B/L GI: BS positive Extremity:  No Edema Skin:   Other R great toe onychogryphosis, healed incision R shoulder Neuro: Alert/Oriented, Cranial Nerve Abnormalities R central 7, Normal Sensory, Abnormal Motor 4-/5 in RUE, 4/5 in RLE, Abnormal FMC Ataxic/ dec FMC and Dysarthric Musc/Skel:  Other no foot or ankle deformities Pedal and post tib pulses normal, skin warm and dry Mood and affect  are appropriate  Assessment/Plan: 1. Functional deficits secondary to L basal ganglia and corona radiata infarct, RHP, R fine motor and coordination deficits which require 3+ hours per day of interdisciplinary therapy in a comprehensive inpatient rehab setting. Physiatrist is providing close team supervision and 24 hour management of active medical problems listed below. Physiatrist and rehab team continue to assess barriers to discharge/monitor patient progress toward functional and medical goals. FIM: FIM - Bathing Bathing Steps Patient Completed: Chest;Right Arm;Left Arm;Abdomen;Front perineal area;Buttocks;Right upper leg;Left upper leg;Right lower leg (including foot);Left lower leg (including foot) Bathing: 5: Supervision: Safety issues/verbal cues  FIM - Upper Body Dressing/Undressing Upper body dressing/undressing steps patient completed: Thread/unthread right sleeve of pullover shirt/dresss;Thread/unthread left sleeve of pullover shirt/dress;Put head through opening of pull over shirt/dress;Pull shirt over trunk Upper body dressing/undressing: 5: Supervision: Safety issues/verbal cues FIM - Lower Body Dressing/Undressing Lower body dressing/undressing steps patient completed: Thread/unthread right underwear leg;Thread/unthread left underwear leg;Pull underwear up/down;Thread/unthread right pants leg;Thread/unthread left pants leg;Pull pants up/down;Don/Doff right sock;Don/Doff left sock;Don/Doff right shoe;Don/Doff left shoe;Fasten/unfasten right shoe;Fasten/unfasten left shoe Lower body dressing/undressing: 5: Set-up assist to: Don/Doff TED stocking  FIM - Toileting Toileting steps completed by patient: Adjust clothing prior to toileting;Performs perineal hygiene;Adjust clothing after toileting Toileting Assistive Devices: Grab bar or rail for support Toileting: 5: Supervision: Safety issues/verbal cues  FIM - Diplomatic Services operational officer Devices: Grab  bars;Walker Toilet Transfers: 4-To toilet/BSC: Min A (steadying Pt. > 75%)  FIM - Bed/Chair Transfer Bed/Chair Transfer Assistive Devices: Therapist, occupational: 4: Chair or W/C > Bed: Min A (steadying Pt. > 75%)  FIM - Locomotion: Wheelchair Distance: 25 Locomotion: Wheelchair: 1: Total Assistance/staff pushes wheelchair (Pt<25%) FIM - Locomotion: Ambulation Locomotion: Ambulation Assistive Devices: Designer, industrial/product Ambulation/Gait Assistance: 4: Min assist Locomotion: Ambulation: 4: Travels 150 ft or more with minimal assistance (Pt.>75%)  Comprehension Comprehension Mode: Auditory Comprehension: 6-Follows complex conversation/direction: With extra time/assistive device  Expression Expression Mode: Verbal Expression: 5-Expresses complex 90% of the time/cues < 10% of the time  Social Interaction Social Interaction: 6-Interacts appropriately with others with medication or extra time (anti-anxiety, antidepressant).  Problem Solving Problem Solving: 5-Solves basic 90% of the time/requires cueing < 10% of the time  Memory Memory Assistive Devices: Other (Comment) (written reminders) Memory: 5-Recognizes or recalls 90% of the time/requires cueing < 10% of the time  Medical Problem List and Plan:  1. Thrombotic left lenticular nucleus-corona radiata  infarct  2. DVT Prophylaxis/Anticoagulation: SCDs. Monitor for signs of DVT  3. Mood: Pt is generally up beat and motivated. Team to provide egosupport as appropriate  4. Neuropsych: This patient is capable of making decisions on his/her own behalf.  5. Hypertension. Lopressor 12.5 mg twice a day, Cozaar 50 mg daily. Monitor with increased mobility  Diastolic high this am but isolated reading-monitor 6. Diabetes mellitus. Hemoglobin A1c 6.7. Amaryl 4 mg daily. Check blood sugars a.c. and at bedtime  7. Hyperlipidemia. Lovaza and Zocor  8. Chronic renal insufficiency. Baseline creatinine 1.6. Followup labs and encourage adequate  fluid intake  LOS (Days) 6 A FACE TO FACE EVALUATION WAS PERFORMED  Tekesha Almgren E 02/08/2012, 8:19 AM

## 2012-02-11 NOTE — Progress Notes (Signed)
Physical Therapy Session Note  Patient Details  Name: Tyler [name] MRN: [id number] Date of Birth: 1928-05-28  Today's Date: 02/11/2012 Time: 1115-1200 Time Calculation (min): 45 min  Short Term Goals: Week 1:  PT Short Term Goal 1 (Week 1): pt will perform all bed mobility with supervision PT Short Term Goal 1 - Progress (Week 1): Met PT Short Term Goal 2 (Week 1): pt will basic transfer L and R with min assist PT Short Term Goal 2 - Progress (Week 1): Met PT Short Term Goal 4 (Week 1): pt will perform gait x 150 with min assist PT Short Term Goal 4 - Progress (Week 1): Met PT Short Term Goal 5 (Week 1): pt will perform dynamic standing activity x 5 minutes with min assist PT Short Term Goal 5 - Progress (Week 1): Met  Skilled Therapeutic Interventions/Progress Updates:   See below for details, focused on high level gait and balance activities   Therapy Documentation Precautions:  Precautions Precautions: Fall Restrictions Weight Bearing Restrictions: No Pain:  No pain reported Mobility:  Sit to stand with supervision. Locomotion :  Gait on the unit with cane and without cane using foot up strap with close supervision.  Pt noted to have a "whip" of LE through swing on the R.  Balance:  Dynamic balance while putting and picking balls up off the floor with close supervision.  Standing on foam balance board performing theraband UE exercises to address core stability and LE balance strategies, with occasional min@ with posterior LOB. See FIM for current functional status  Therapy/Group: Individual Therapy  [name] 02/11/2012, 3:45 PM

## 2012-02-11 NOTE — Progress Notes (Signed)
Orthopedic Tech Progress Note Patient Details:  Tyler [name] [birth year] [id number]  Patient ID: Tyler [name], male   DOB: 08/02/1928, 78 y.o.   MRN: [id number]   Tyler [name] 02/11/2012, 2:15 PM Foot up brace completed by jeff from [facility]

## 2012-02-11 NOTE — Progress Notes (Signed)
Speech Language Pathology Daily Session Note  Patient Details  Name: Tyler [name] MRN: [id number] Date of Birth: 10-27-1928  Today's Date: 02/11/2012 Time: 0102-7253 Time Calculation (min): 30 min  Short Term Goals: Week 1: SLP Short Term Goal 1 (Week 1): Pt will improve speech intelligibility by completing 10 reps of oral motor exercises for increased ROM, strength, and coordination for functional verbal communication SLP Short Term Goal 1 - Progress (Week 1): Progressing toward goal SLP Short Term Goal 2 (Week 1): Pt will increase functional speech production for verbalizing and using compensatory strategies to 80% accuracy given mod cues. SLP Short Term Goal 2 - Progress (Week 1): Progressing toward goal SLP Short Term Goal 3 (Week 1): Pt will improve functional thought organization and reasoning skills by completing tasks of increasing complexity with 75% accuracy given mod cues. SLP Short Term Goal 3 - Progress (Week 1): Progressing toward goal SLP Short Term Goal 4 (Week 1): Pt will increase functional cognitive skills for STM recall of information at sentence level after 5 minute time lapse to 80% accuracy given min cues. SLP Short Term Goal 4 - Progress (Week 1): Progressing toward goal  Skilled Therapeutic Interventions: Treatment session focused on addressing speech-language goals.  SLP facilitated session with a divided attention task i.e. walking and talking.  Patient required supervision level cues speech intelligibilty during  naming tasks.  SLP also facilitated session with discussion regarding how to modify activities to encourage safety at home, other discharge information and follow up .     FIM:  Comprehension Comprehension Mode: Auditory Comprehension: 6-Follows complex conversation/direction: With extra time/assistive device Expression Expression Mode: Verbal Expression: 5-Expresses complex 90% of the time/cues < 10% of the time Social Interaction Social  Interaction: 6-Interacts appropriately with others with medication or extra time (anti-anxiety, antidepressant). Problem Solving Problem Solving: 6-Solves complex problems: With extra time Memory Memory: 6-More than reasonable amt of time FIM - Eating Eating Activity: 6: Assistive device: dentures  Pain Pain Assessment Pain Assessment: No/denies pain Pain Score: 0-No pain  Therapy/Group: Individual Therapy  Charlane Ferretti., CCC-SLP [phone]  [name] 02/11/2012, 11:27 AM

## 2012-02-11 NOTE — Progress Notes (Signed)
Orthopedic Tech Progress Note Patient Details:  Tyler [name] October 22, 1928 [id number]  Patient ID: Tyler [name], male   DOB: Nov 07, 1928, 78 y.o.   MRN: [id number]   Tyler [name] 02/11/2012, 8:49 AM CALLED [facility] FOR RIGHT TOE UP BRACE

## 2012-02-11 NOTE — Discharge Summary (Signed)
  Discharge summary job [id number]

## 2012-02-11 NOTE — Progress Notes (Addendum)
Occupational Therapy Session Note  Patient Details  Name: Tyler [name] MRN: [id number] Date of Birth: March 07, 1929  Today's Date: 02/11/2012 Time: 0930-1030 Time Calculation (min): 60 min  Short Term Goals: Week 2:  OT Short Term Goal 1 (Week 2): STG = LTG  Skilled Therapeutic Interventions/Progress Updates:  D/C ADL completed in tub/shower combo this AM. Pt states that he has a walk-in shower and tub/shower combo at home. He normally uses tub/shower combo. Recommended pt use a shower chair and hand held shower head in tub/shower. Educated patient on tub/shower transfer using step in method with shower walls for stability. No grab bars available in pt's shower (tub or walk-in). Pt states that his walk-in shower has a built in seat. If pt uses walk-in shower he would only need to purchase a hand held shower head as long as the built in seat in big enough/safe for him to sit on.  Pt has met all LTGs and is ready for D/C tomorrow after family education with therapy.  Therapy Documentation Precautions:  Precautions Precautions: Fall Restrictions Weight Bearing Restrictions: No  Pain: Pain Assessment Pain Assessment: No/denies pain Pain Score: 0-No pain  See FIM for current functional status  Therapy/Group: Individual Therapy  Limmie Patricia, OTR/L 02/11/2012, 10:19 AM

## 2012-02-11 NOTE — Discharge Summary (Signed)
NAME:  Tyler [name], Tyler [name] NO.:  [id number]  MEDICAL RECORD NO.:  [id number]  LOCATION:  [id number]                         FACILITY:  [facility]  PHYSICIAN:  Tyler [name], M.D.DATE OF BIRTH:  11/02/1928  DATE OF ADMISSION:  02/02/2012 DATE OF DISCHARGE:  02/12/2012                              DISCHARGE SUMMARY   DISCHARGE DIAGNOSES: 1. Thrombotic left lenticular nucleus corona radiata infarction. 2. Sequential compression devices for deep vein thrombosis prophylaxis     hypertension, diabetes mellitus, hyperlipidemia, chronic renal     insufficiency.  This is an 78 year old right-handed male with history of hypertension, chronic renal insufficiency with baseline creatinine 1.7 who was admitted January 28, 2012, with right-sided weakness and slurred speech.  MRI of the brain showed acute nonhemorrhagic infarct, posterior left lenticular nucleus extending into the posterior aspect of the left corona radiata.  MRA of the head with intracranial atherosclerotic type changes as well as 1.4 mm aneurysm posterolateral aspect left internal carotid artery.  Echocardiogram with ejection fraction of 70% and grade 1 diastolic dysfunction.  Carotid Dopplers with no ICA stenosis.  The patient did not receive tPA.  Neurology Service was consulted, placed on Plavix therapy for stroke prophylaxis.  Tyler [name] of Vascular Surgery notified in regards a small aneurysm identified on MRI.  Advised no surgical intervention or workup at this time.  Findings of mildly elevated hemoglobin A1c of 6.7, placed on Amaryl with monitoring of blood sugars as well as diabetic diet.  The patient was admitted for comprehensive rehab program.  PAST MEDICAL HISTORY:  See discharge diagnoses.  SOCIAL HISTORY:  Lives with spouse.  One level home, 2 steps to entry.  Functional history prior to admission was independent driving, retired. Functional status upon admission to rehab services was +2 total  assist to ambulate 20 feet with a large base quad cane.  PHYSICAL EXAMINATION:  VITAL SIGNS:  Blood pressure 155/66, pulse 66, temperature 97.7, respirations 18. GENERAL:  This was an alert male, oriented to person, place, and time. Well developed, right facial droop. HEENT:  Pupils round and reactive to light.  Speech is mildly dysarthric, but fully intelligible. LUNGS:  Clear to auscultation. CARDIAC:  Regular rate and rhythm. ABDOMEN:  Soft, nontender.  Good bowel sounds.  REHABILITATION HOSPITAL COURSE:  The patient was admitted to inpatient rehab services with therapies initiated on a 3-hour daily basis consisting of physical therapy, occupational therapy, speech therapy, and rehabilitation nursing.  The following issues were addressed during the patient's rehabilitation stay.  Pertaining to Tyler [name]'s thrombotic left cerebrovascular accident remained stable, maintained on Plavix therapy.  Sequential compression devices were in place for DVT prophylaxis.  Blood pressures controlled on Lopressor and Cozaar with no orthostatic changes and he would follow up with his primary MD.  Noted findings of mildly elevated hemoglobin A1c of 6.7, maintained on low- dose Amaryl.  Latest blood sugars 121, 170, 144 and 1993.  He did receive diabetic teaching.  He remained on Lovaza as well as Zocor for hyperlipidemia.  Noted chronic renal insufficiency with baseline creatinine of 1.7 on admission.  Latest creatinine 1.6 and monitored. The patient received weekly collaborative interdisciplinary team conferences to  discuss estimated length of stay, family teaching, and any barriers to his discharge.  He was continent of bowel and bladder. He was currently supervision for everything with activities of daily living of bathing, dressing, toileting, transfers, grooming, minimal assist at times for shower transfers.  Supervision transfers, minimal assist to ambulate 35 to ambulate with a rolling  walker.  Slow progress with balance and right foot clearance.  Full family teaching was completed with his wife and plans will be discharge to home with ongoing therapies dictated as per rehab services.  DISCHARGE MEDICATIONS:  At the time of dictation included; 1. Plavix 75 mg daily. 2. Amaryl 4 mg daily. 3. Cozaar 50 mg daily. 4. Lopressor 12.5 mg twice daily. 5. Lovaza 1 g daily. 6. Protonix 40 mg daily. 7. Zocor 5 mg daily.  DIET:  1800-calorie ADA diet.  SPECIAL INSTRUCTIONS:  The patient would followup Tyler [name] at the outpatient rehab center on February 26, 2012.  Tyler [name], medical management, February 22, 2012.  Tyler [name], neurology service, call for appointment.  Therapies had been arranged as per Rehab Services at the time of discharge.     Tyler [name], P.A.   ______________________________ Tyler [name], M.D.    DA/MEDQ  D:  02/11/2012  T:  02/11/2012  Job:  [id number]  cc:   Tyler [name], M.D. Tyler P. Pearlean Brownie, MD Tyler [name], M.D.

## 2012-02-11 NOTE — Progress Notes (Addendum)
Physical Therapy Session Note  Patient Details  Name: Tyler [name] MRN: [id number] Date of Birth: March 09, 1929  Today's Date: 02/11/2012 Time: 0805-0902 Time Calculation (min): 57 min  Short Term Goals: Week 2:  PT Short Term Goal 1 (Week 2): week 2 goals = LTG, overall Mod I except S for community gait and Min A stairs x5  Skilled Therapeutic Interventions/Progress Updates:  Treatment focus on gait training and therapeutic activities, neuromuscular re-education via visual feedback, VCs .  Standing balance activity using urinal with modified independence including clothing mgt.  Gait with SPC x 150' with supervision, with 2 instances of R toes catching floor, recovered independently.  Pt is awaiting Foot Up brace for R foot drop.  His tendency to stub R toes is increased with divided attention/distractibility of greeting people in hall.    Up/down 5 steps with R rail ascending (as per home set-up) modified independent.  Pt does not remember sequence for step-to pattern, but has sufficient strength to use step-through pattern.    Gait up/down ramp with SPC with supervision.  Gait up/down curb required min assist, x 2 trials.  Pt leading with R foot descending or L foot descending resulted in R heel catching on edge of curb.    Floor transfer to/from mat table with VCs for technique.  Provided hand-outs for HEP of general neuromuscular re-education/coordination exs for RLE: bil ankle circles, R terminal knee extension, bil glut sets, R heel slide, R hip abduction in supine; alternating toes up/toes down and bil heel cord stretching in standing at counter.  Pt return demonstrated exs modified independent with use of hand-out.  Orthotist delivered Foot Up brace after PT ended.  Further family ed planned for tomorrow when wife gets off work.       Therapy Documentation Precautions:  Precautions Precautions: Fall Restrictions Weight Bearing Restrictions: No Pain: Pain Assessment Pain  Assessment: No/denies pain   Locomotion : Ambulation Ambulation/Gait Assistance: 5: Supervision      See FIM for current functional status  Therapy/Group: Individual Therapy  Charyl Minervini 02/11/2012, 4:15 PM

## 2012-02-12 ENCOUNTER — Encounter (HOSPITAL_COMMUNITY): Payer: Medicare Other | Admitting: Occupational Therapy

## 2012-02-12 ENCOUNTER — Inpatient Hospital Stay (HOSPITAL_COMMUNITY): Payer: Medicare Other | Admitting: Speech Pathology

## 2012-02-12 ENCOUNTER — Inpatient Hospital Stay (HOSPITAL_COMMUNITY): Payer: Medicare Other

## 2012-02-12 LAB — GLUCOSE, CAPILLARY

## 2012-02-12 MED ORDER — CLOPIDOGREL BISULFATE 75 MG PO TABS: 75.0000 mg | ORAL_TABLET | Freq: Every day | ORAL | Status: DC

## 2012-02-12 MED ORDER — PANTOPRAZOLE SODIUM 40 MG PO TBEC: 40.0000 mg | DELAYED_RELEASE_TABLET | Freq: Every day | ORAL | Status: DC

## 2012-02-12 MED ORDER — GLIMEPIRIDE 4 MG PO TABS: 4.0000 mg | ORAL_TABLET | Freq: Every day | ORAL | Status: DC

## 2012-02-12 MED ORDER — METOPROLOL TARTRATE 12.5 MG HALF TABLET: 12.5000 mg | ORAL_TABLET | Freq: Two times a day (BID) | ORAL | Status: DC

## 2012-02-12 MED ORDER — PRAVASTATIN SODIUM 10 MG PO TABS: 10.0000 mg | ORAL_TABLET | Freq: Every day | ORAL | Status: DC

## 2012-02-12 MED ORDER — LOSARTAN POTASSIUM 25 MG PO TABS: 50.0000 mg | ORAL_TABLET | Freq: Every day | ORAL | Status: DC

## 2012-02-12 NOTE — Progress Notes (Signed)
Speech Language Pathology Session Note & Discharge Summary  Patient Details  Name: [name] MRN: [id number] Date of Birth: 1928/09/01  Today's Date: 02/12/2012 Time: 4403-4742 Time Calculation (min): 45 min  Skilled Therapeutic Intervention: Treatment focus on utilization of speech intelligibility strategies and anticipatory awareness. Pt participated in functional conversation and was 100% intelligible and was overall Mod I for utilization of strategies. Pt demonstrated anitapatory awareness in regards to discharge planning and f/u therapy needs. Pt will discharge home today.   Patient has met 5 of 5 long term goals.  Patient to discharge at overall Supervision;Modified Independent level.   Reasons goals not met: N/A   Clinical Impression/Discharge Summary: Pt has made functional gains and has met all LTG's this admission. Currently, pt is overall supervision-Mod I for complex problem solving and working memory with utilization of swallowing compensatory strategies. Pt is also overall Mod I-supervision for speech intelligibility at the conversation level and for utilization of strategies. Pt would benefit f/u outpatient SLP intervention to maximize overall speech intelligibility and cognitive function to increase overall functional independence.   Care Partner:  Caregiver Able to Provide Assistance: Yes  Type of Caregiver Assistance: Physical;Cognitive  Recommendation:  Outpatient SLP  Rationale for SLP Follow Up: Maximize functional communication;Maximize cognitive function and independence   Equipment: N/A   Reasons for discharge: Treatment goals met;Discharged from hospital   Patient/Family Agrees with Progress Made and Goals Achieved: Yes   See FIM for current functional status  [name] 02/12/2012, 4:05 PM

## 2012-02-12 NOTE — Progress Notes (Signed)
Patient discharged to home with wife at 20. Discharge instructions provided by Deatra Ina, PA. Patient and wife verbalized understanding. Hedy Camara

## 2012-02-12 NOTE — Progress Notes (Signed)
Occupational Therapy Session Note  Patient Details  Name: Tyler [name] MRN: [id number] Date of Birth: Apr 02, 1929  Today's Date: 02/12/2012 Time: 1430-1510 Time Calculation (min): 40 min  Short Term Goals: Week 2:  OT Short Term Goal 1 (Week 2): STG = LTG  Skilled Therapeutic Interventions/Progress Updates:    Pt seen for hand's on family education with wife prior to d/c home.  Pt completed ADL retraining at walk-in shower level with min cues for Rt foot advancement over lip in walk-in shower.  Educated wife on decreased distractions when ambulating and appropriate cues to provide to increase safety.  Pt overall modified independent with self-care tasks, supervision with walk-in shower transfer.  Pt's wife reports change in pt's schedule upon returning home to ensure her availability to assist with bathroom transfers.  Therapy Documentation Precautions:  Precautions Precautions: Fall Restrictions Weight Bearing Restrictions: No  Pain:   Pt with no c/o pain this session.  See FIM for current functional status  Therapy/Group: Individual Therapy  Leonette Monarch 02/12/2012, 3:33 PM

## 2012-02-12 NOTE — Progress Notes (Signed)
Patient ID: Tyler [name], male   DOB: 1928/10/12, 78 y.o.   MRN: [id number]  Tyler [name] is a 78 y.o. right-handed male with history of hypertension, chronic renal insufficiency with baseline creatinine 1.7. Admitted 01/28/2012 with right sided weakness and slurred speech. MRI of the brain showed acute nonhemorrhagic infarct posterior left lenticular nucleus extending into the posterior aspect of the left corona radiata. MRA of the head with intracranial atherosclerotic type changes as well 1.4 mm aneurysm posterior lateral aspect left internal carotid artery. Echocardiogram with ejection fraction of 70% and grade 1 diastolic dysfunction. Carotid Dopplers with no ICA stenosis. Patient did not receive TPA. Neurology services consulted placed on Plavix therapy for stroke prophylaxis.  Dr. Darrick Penna vascular surgery notified in regards to small aneurysm identified on MRA advised no surgical intervention or further workup   Subjective/Complaints: PT rec foot up brace. Amb with Sup using this device Discussed rec for 24/7 sup and no driving Review of Systems  Neurological: Positive for focal weakness.  All other systems reviewed and are negative.   Objective: Vital Signs: Blood pressure 146/78, pulse 70, temperature 97.8 F (36.6 C), temperature source Oral, resp. rate 17, height 5\' 10"  (1.778 m), weight 81.5 kg (179 lb 10.8 oz), SpO2 97.00%. No results found. Results for orders placed during the hospital encounter of 02/02/12 (from the past 72 hour(s))  GLUCOSE, CAPILLARY     Status: Abnormal   Collection Time   02/05/12 11:28 AM      Component Value Range Comment   Glucose-Capillary 137 (*) 70 - 99 mg/dL    Comment 1 Notify RN     GLUCOSE, CAPILLARY     Status: Abnormal   Collection Time   02/05/12  4:42 PM      Component Value Range Comment   Glucose-Capillary 173 (*) 70 - 99 mg/dL   GLUCOSE, CAPILLARY     Status: Abnormal   Collection Time   02/05/12  9:05 PM      Component Value Range  Comment   Glucose-Capillary 165 (*) 70 - 99 mg/dL   GLUCOSE, CAPILLARY     Status: Abnormal   Collection Time   02/06/12  9:08 AM      Component Value Range Comment   Glucose-Capillary 197 (*) 70 - 99 mg/dL   GLUCOSE, CAPILLARY     Status: Abnormal   Collection Time   02/06/12 11:02 AM      Component Value Range Comment   Glucose-Capillary 186 (*) 70 - 99 mg/dL   GLUCOSE, CAPILLARY     Status: Abnormal   Collection Time   02/06/12  4:32 PM      Component Value Range Comment   Glucose-Capillary 121 (*) 70 - 99 mg/dL   GLUCOSE, CAPILLARY     Status: Normal   Collection Time   02/06/12  9:08 PM      Component Value Range Comment   Glucose-Capillary 92  70 - 99 mg/dL   GLUCOSE, CAPILLARY     Status: Normal   Collection Time   02/07/12  6:36 AM      Component Value Range Comment   Glucose-Capillary 93  70 - 99 mg/dL   GLUCOSE, CAPILLARY     Status: Abnormal   Collection Time   02/07/12 11:57 AM      Component Value Range Comment   Glucose-Capillary 154 (*) 70 - 99 mg/dL    Comment 1 Notify RN     GLUCOSE, CAPILLARY     Status:  Abnormal   Collection Time   02/07/12  4:14 PM      Component Value Range Comment   Glucose-Capillary 128 (*) 70 - 99 mg/dL   GLUCOSE, CAPILLARY     Status: Abnormal   Collection Time   02/07/12  8:28 PM      Component Value Range Comment   Glucose-Capillary 174 (*) 70 - 99 mg/dL    Comment 1 Notify RN     GLUCOSE, CAPILLARY     Status: Abnormal   Collection Time   02/08/12  7:23 AM      Component Value Range Comment   Glucose-Capillary 110 (*) 70 - 99 mg/dL      HEENT: normal Cardio: RRR Resp: CTA B/L GI: BS positive Extremity:  No Edema Skin:   Other R great toe onychogryphosis, healed incision R shoulder Neuro: Alert/Oriented, Cranial Nerve Abnormalities R central 7, Normal Sensory, Abnormal Motor 4-/5 in RUE, 4/5 in RLE, Abnormal FMC Ataxic/ dec FMC and Dysarthric Musc/Skel:  Other no foot or ankle deformities Pedal and post tib pulses normal,  skin warm and dry Mood and affect are appropriate  Assessment/Plan: 1. Functional deficits secondary to L basal ganglia and corona radiata infarct, RHP, R fine motor and coordination deficits Stable for d/c to home See summary PMR PCP Neuro f/u appts  Medical Problem List and Plan:  1. Thrombotic left lenticular nucleus-corona radiata infarct  2. DVT Prophylaxis/Anticoagulation: SCDs. Monitor for signs of DVT  3. Mood: Pt is generally up beat and motivated. Team to provide egosupport as appropriate  4. Neuropsych: This patient is capable of making decisions on his/her own behalf.  5. Hypertension. Lopressor 12.5 mg twice a day, Cozaar 50 mg daily. Monitor with increased mobility  Diastolic high this am but isolated reading-monitor 6. Diabetes mellitus. Hemoglobin A1c 6.7. Amaryl 4 mg daily. Check blood sugars a.c. and at bedtime  7. Hyperlipidemia. Lovaza and Zocor  8. Chronic renal insufficiency. Baseline creatinine 1.6. Followup labs and encourage adequate fluid intake  LOS (Days) 6 A FACE TO FACE EVALUATION WAS PERFORMED  Mardell Suttles E 02/08/2012, 8:19 AM

## 2012-02-12 NOTE — Progress Notes (Addendum)
Physical Therapy Discharge Summary  Patient Details  Name: Tyler [name] MRN: [id number] Date of Birth: [birth year]  Today's Date: 02/12/2012 Time: 0805-0900 Time Calculation (min): 55 min  Patient has met 9 of 11 long term goals due to improved activity tolerance, improved balance, increased strength, ability to compensate for deficits, functional use of  right upper extremity and right lower extremity, improved awareness and improved coordination.  Patient to discharge at an ambulatory level Modified Independent in home.   With distractions of pt greeting people in hall, carts in hall, etc, pt occasionally caught R toes when wearing toe strap for foot drop, but recovered independently each time. Pt is discharging with R Foot Up AFO, which should control this better; therapist was unable to observe gait with it as family took it home the day before d/c.   Family has been advised to limit pt's socialization while he is ambulating at this time.  Patient's care partner is independent to provide the necessary cognitive assistance at discharge for car transfer.  Reasons goals not met: community ambulation not addressed and floor transfer not practiced further due to early d/c at family's request  Recommendation:  Patient will benefit from ongoing skilled PT services in home health setting to continue to advance safe functional mobility, address ongoing impairments in coordination, balance, motor timing and sequencing, and minimize fall risk.  Equipment: SPC,  and Foot Up R AFO (from [facility])  Reasons for discharge: treatment goals met and discharge from hospital  Patient/family agrees with progress made and goals achieved: Yes  PT Discharge Precautions/Restrictions Precautions Precautions: Fall Pain- none      Cognition Orientation Level: Oriented X4 Sensation Sensation Light Touch: Appears Intact Proprioception: Appears Intact Coordination Heel Shin Test: slightly decreased  speed and accuracy RLE Motor  Motor Motor - Skilled Clinical Observations: Right Hemiparesis  Mobility-  modified independent bed mobility and basic and furniture transfers; VCs for floor transfers (hand out provided and fall recovery discussed)   Locomotion  Gait Gait Pattern: Narrow base of support;Trunk rotated posteriorly on right;Decreased dorsiflexion - right;Decreased hip/knee flexion - right;Step-through pattern (lack of R trunk rotation and arm swing) Gait velocity: 16 seconds /32.[name]8' Stairs / Additional Locomotion Stairs: Yes Stairs Assistance: 5: Supervision (using SPC) Stairs Assistance Details:  (pt inconsistent with pattern) Stair Management Technique: One rail Right;Step to pattern Height of Stairs: 7  Ramp: 5: Supervision Curb: 4: Min assist (using SPC) Wheelchair Mobility Wheelchair Mobility: No  Trunk/Postural Assessment  Cervical Assessment Cervical Assessment: Within Functional Limits Thoracic Assessment Thoracic Assessment: Within Functional Limits Lumbar Assessment Lumbar Assessment: Within Functional Limits Postural Control Postural Control: Within Functional Limits  Balance Balance Balance Assessed: Yes Standardized Balance Assessment Standardized Balance Assessment: Berg Balance Test Berg Balance Test Sit to Stand: Able to stand  independently using hands Standing Unsupported: Able to stand 2 minutes with supervision Sitting with Back Unsupported but Feet Supported on Floor or Stool: Able to sit safely and securely 2 minutes Stand to Sit: Controls descent by using hands Transfers: Able to transfer safely, definite need of hands Standing Unsupported with Eyes Closed: Able to stand 10 seconds with supervision Standing Ubsupported with Feet Together: Able to place feet together independently and stand for 1 minute with supervision From Standing, Reach Forward with Outstretched Arm: Can reach forward >5 cm safely (2") From Standing Position, Pick up  Object from Floor: Able to pick up shoe, needs supervision From Standing Position, Turn to Look Behind Over each Shoulder: Looks  behind from both sides and weight shifts well Turn 360 Degrees: Needs close supervision or verbal cueing Standing Unsupported, Alternately Place Feet on Step/Stool: Able to complete >2 steps/needs minimal assist Standing Unsupported, One Foot in Front: Needs help to step but can hold 15 seconds Standing on One Leg: Tries to lift leg/unable to hold 3 seconds but remains standing independently Total Score: 35 - high risk for falls  Extremity Assessment      RLE Strength RLE Overall Strength Comments: grossly 4+/5 throughout LLE Assessment LLE Assessment: Within Functional Limits  See FIM for current functional status  Tyler [name] 02/12/2012, 2:50 PM

## 2012-02-12 NOTE — Progress Notes (Signed)
Occupational Therapy Session Note  Patient Details  Name: Tyler [name] MRN: [id number] Date of Birth: 1928/09/23  Today's Date: 02/12/2012 Time: 0930-1030 Time Calculation (min): 60 min  Short Term Goals: Week 2:  OT Short Term Goal 1 (Week 2): STG = LTG  Skilled Therapeutic Interventions/Progress Updates:  ADL re-training session completed in ADL kitchen for simple meal prep with focus on safety awareness, RUE FMC, dynamic standing balance, functional mobility in the kitchen, task sequencing, and right side awareness. Pt completed scrambled eggs dish with supervision provided from therapist. vc's to locate items due to unfamiliar environment. vc's/prompts needed for initiating meal prep. Once patient started task he did not need further cues regarding step sequencing.    Therapy Documentation Precautions:  Precautions Precautions: Fall Restrictions Weight Bearing Restrictions: No Pain: Pain Assessment Pain Assessment: No/denies pain Exercises: General Exercises - Upper Extremity Shoulder Flexion: AAROM;Supine;Seated (12 reps) Shoulder Flexion Limitations: mirror used for visual feedback during exercises Shoulder Extension: AAROM;Supine;Seated (12 reps) Shoulder ABduction: AAROM;Right;Supine (12 reps) Shoulder Horizontal ABduction: AAROM;Supine (12 reps) Shoulder Horizontal ADduction: AAROM;Supine (12 reps)  See FIM for current functional status  Therapy/Group: Individual Therapy  Limmie Patricia, OTR/L 02/12/2012, 10:28 AM

## 2012-02-12 NOTE — Progress Notes (Signed)
Social Work Discharge Note Discharge Note  The overall goal for the admission was met for:   Discharge location: Yes-HOME WITH WIFE THERE IN THE EVENINGS  Length of Stay: Yes-10 DAYS  Discharge activity level: Yes-MOD/I LEVEL  Home/community participation: Yes  Services provided included: MD, RD, PT, OT, SLP, RN, TR, Pharmacy and SW  Financial Services: Private Insurance: [facility]  Follow-up services arranged: Home Health: [facility]-PT, OT, RN, DME: [facility] and Patient/Family has no preference for HH/DME agencies HHOT TO DETERMINE IF BUILD IN TUB SEAT WORKS FOR PT OR IF HE NEEDS A DIFFERENT ONE.  Comments (or additional information):FAMILY EDUCATION COMPLETED AND BOTH FEEL READY FOR DISCHARGE  Patient/Family verbalized understanding of follow-up arrangements: Yes  Individual responsible for coordination of the follow-up plan: PATIENT & BARBARA-WIFE  Confirmed correct DME delivered: Lucy Chris 02/12/2012    Lucy Chris

## 2012-02-26 ENCOUNTER — Inpatient Hospital Stay: Payer: Medicare Other | Admitting: Physical Medicine & Rehabilitation

## 2012-03-01 ENCOUNTER — Telehealth: Payer: Self-pay

## 2012-03-01 NOTE — Telephone Encounter (Signed)
Tyler [name] has evaluated patient and will start speech therapy.

## 2012-03-02 ENCOUNTER — Telehealth: Payer: Self-pay | Admitting: Physical Medicine & Rehabilitation

## 2012-03-02 NOTE — Telephone Encounter (Signed)
Patient feels speech has greatly improved.  Has difficulty only with difficult words.  Therapist agrees and will not be doing a Speech eval with this patient.

## 2012-03-08 ENCOUNTER — Encounter: Payer: Medicare Other | Attending: Physical Medicine & Rehabilitation

## 2012-03-08 ENCOUNTER — Encounter: Payer: Self-pay | Admitting: Physical Medicine & Rehabilitation

## 2012-03-08 ENCOUNTER — Ambulatory Visit (HOSPITAL_BASED_OUTPATIENT_CLINIC_OR_DEPARTMENT_OTHER): Payer: Medicare Other | Admitting: Physical Medicine & Rehabilitation

## 2012-03-08 VITALS — BP 155/60 | HR 63 | Resp 14 | Ht 70.0 in | Wt 179.2 lb

## 2012-03-08 DIAGNOSIS — R279 Unspecified lack of coordination: Secondary | ICD-10-CM | POA: Insufficient documentation

## 2012-03-08 DIAGNOSIS — I69959 Hemiplegia and hemiparesis following unspecified cerebrovascular disease affecting unspecified side: Secondary | ICD-10-CM | POA: Insufficient documentation

## 2012-03-08 DIAGNOSIS — G8110 Spastic hemiplegia affecting unspecified side: Secondary | ICD-10-CM

## 2012-03-08 NOTE — Patient Instructions (Signed)
outpatient physical therapy and occupation therapy twice a week  Return to driving as outlined below: Graduated return to driving instructions were provided. It is recommended that the patient first drives with another licensed driver in an empty parking lot. If the patient does well with this, and they can drive on a quiet street with the licensed driver. If the patient does well with this they can drive on a busy street with a licensed driver. If the patient does well with this, the next time out they can go by himself. For the first month after resuming driving, I recommend no nighttime or Interstate driving.   See me in one month

## 2012-03-08 NOTE — Progress Notes (Signed)
Subjective:    Patient ID: Tyler [name], male    DOB: 1928-10-07, 78 y.o.   MRN: [id number] This is an 78 year old right-handed male with history of hypertension,  chronic renal insufficiency with baseline creatinine 1.7 who was  admitted January 28, 2012, with right-sided weakness and slurred  speech. MRI of the brain showed acute nonhemorrhagic infarct, posterior  left lenticular nucleus extending into the posterior aspect of the left  corona radiata. MRA of the head with intracranial atherosclerotic type  changes as well as 1.4 mm aneurysm posterolateral aspect left internal  carotid artery. Echocardiogram with ejection fraction of 70% and grade  1 diastolic dysfunction. Carotid Dopplers with no ICA stenosis. The  patient did not receive tPA  HPI CIR F/U DATE OF ADMISSION: 02/02/2012  DATE OF DISCHARGE: 02/12/2012 Finishing up home health PT OT Still ambulating with a cane outdoors but without assistive device in a house His wife still drives him off after a bath but otherwise is bathing and dressing independently with a shower chair Has not driven since discharge  Pain Inventory Average Pain 0 Pain Right Now 0 My pain is no pain  In the last 24 hours, has pain interfered with the following? General activity 0 Relation with others 0 Enjoyment of life 0 What TIME of day is your pain at its worst? no pain Sleep (in general) Fair  Pain is worse with: no pain Pain improves with: no pain Relief from Meds: no pain  Mobility use a cane how many minutes can you walk? 20 ability to climb steps?  yes do you drive?  no  Function retired I need assistance with the following:  meal prep, household duties and shopping  Neuro/Psych weakness  Prior Studies Any changes since last visit?  no  Physicians involved in your care Any changes since last visit?  no   History reviewed. No pertinent family history. History   Social History  . Marital Status: Married   Spouse Name: N/A    Number of Children: N/A  . Years of Education: N/A   Social History Main Topics  . Smoking status: Former Smoker    Quit date: 05/04/1968  . Smokeless tobacco: Never Used  . Alcohol Use: No  . Drug Use: No  . Sexually Active: None   Other Topics Concern  . None   Social History Narrative  . None   Past Surgical History  Procedure Date  . Prostate surgery   . Cardiac catheterization    Past Medical History  Diagnosis Date  . Hypertension   . Cancer   . Stroke   . Kidney stones    BP 155/60  Pulse 63  Resp 14  Ht 5\' 10"  (1.778 m)  Wt 179 lb 3.2 oz (81.285 kg)  BMI 25.71 kg/m2  SpO2 96%   Review of Systems  Neurological: Positive for weakness.  All other systems reviewed and are negative.       Objective:   Physical Exam  Constitutional: He is oriented to person, place, and time. He appears well-developed and well-nourished.  HENT:  Head: Normocephalic and atraumatic.  Eyes: Conjunctivae normal and EOM are normal. Pupils are equal, round, and reactive to light.       Visual fields are intact to confrontation testing  Neck: Normal range of motion.  Musculoskeletal:       Right shoulder: Normal.  Neurological: He is alert and oriented to person, place, and time. He displays no atrophy and no tremor. No  cranial nerve deficit or sensory deficit. Coordination and gait abnormal.  Reflex Scores:      Tricep reflexes are 3+ on the right side and 2+ on the left side.      Bicep reflexes are 3+ on the right side and 2+ on the left side.      Brachioradialis reflexes are 3+ on the right side and 2+ on the left side.      Patellar reflexes are 3+ on the right side and 2+ on the left side.      Achilles reflexes are 3+ on the right side and 2+ on the left side. Psychiatric: He has a normal mood and affect.          Assessment & Plan:  1. Right spastic hemiparesis due to to left basal ganglia and corona radiata infarct. Has residual right  upper extremity fine motor deficits as well as balance disorder. Would benefit from additional outpatient therapy twice a week for 4 weeks. May return to driving as outlined below Graduated return to driving instructions were provided. It is recommended that the patient first drives with another licensed driver in an empty parking lot. If the patient does well with this, and they can drive on a quiet street with the licensed driver. If the patient does well with this they can drive on a busy street with a licensed driver. If the patient does well with this, the next time out they can go by himself. For the first month after resuming driving, I recommend no nighttime or Interstate driving.  Discussed the above with the patient and his wife  RTC 4 weeks

## 2012-04-05 ENCOUNTER — Ambulatory Visit: Payer: Medicare Other | Admitting: Physical Medicine & Rehabilitation

## 2012-04-07 ENCOUNTER — Ambulatory Visit: Payer: Medicare Other | Admitting: Physical Therapy

## 2012-04-12 ENCOUNTER — Telehealth: Payer: Self-pay

## 2012-04-12 NOTE — Telephone Encounter (Signed)
Pharmacy called to get refill on plavix.  Advised them to contact primary care as we do not manage this medication after they have been discharged from the hospital.

## 2012-04-29 ENCOUNTER — Telehealth: Payer: Self-pay | Admitting: Physical Medicine & Rehabilitation

## 2012-04-29 DIAGNOSIS — G8110 Spastic hemiplegia affecting unspecified side: Secondary | ICD-10-CM

## 2012-04-29 NOTE — Telephone Encounter (Signed)
Neuro rehab is requesting an updated order.  Neuro spoke to patient before holidays to schedule, however, patient wanted to wait until after the holidays.

## 2012-06-10 ENCOUNTER — Other Ambulatory Visit: Payer: Self-pay | Admitting: *Deleted

## 2012-06-10 MED ORDER — LOSARTAN POTASSIUM 25 MG PO TABS: 50.0000 mg | ORAL_TABLET | Freq: Every day | ORAL | Status: DC

## 2012-07-25 ENCOUNTER — Other Ambulatory Visit: Payer: Self-pay | Admitting: Endocrinology

## 2012-07-25 ENCOUNTER — Ambulatory Visit
Admission: RE | Admit: 2012-07-25 | Discharge: 2012-07-25 | Disposition: A | Discharge status: Home / Self Care | Payer: Medicare Other | Source: Ambulatory Visit | Attending: Endocrinology | Admitting: Endocrinology

## 2012-07-25 DIAGNOSIS — M545 Low back pain: Secondary | ICD-10-CM

## 2012-07-25 DIAGNOSIS — R109 Unspecified abdominal pain: Secondary | ICD-10-CM

## 2012-09-28 ENCOUNTER — Ambulatory Visit (INDEPENDENT_AMBULATORY_CARE_PROVIDER_SITE_OTHER): Payer: Medicare Other | Admitting: Nurse Practitioner

## 2012-09-28 ENCOUNTER — Encounter: Payer: Self-pay | Admitting: Nurse Practitioner

## 2012-09-28 VITALS — BP 140/80 | HR 68 | Ht 68.0 in | Wt 184.0 lb

## 2012-09-28 DIAGNOSIS — R413 Other amnesia: Secondary | ICD-10-CM | POA: Insufficient documentation

## 2012-09-28 DIAGNOSIS — I6350 Cerebral infarction due to unspecified occlusion or stenosis of unspecified cerebral artery: Secondary | ICD-10-CM

## 2012-09-28 DIAGNOSIS — I10 Essential (primary) hypertension: Secondary | ICD-10-CM

## 2012-09-28 NOTE — Patient Instructions (Addendum)
Memory is stable Continue Plavix for secondary stroke prevention Her control of hypertension with blood pressure goal below 130/80 LDL cholesterol below 70 Hemoglobin A1c below 6.5 Exercise by walking every day Followup in 6 months

## 2012-09-28 NOTE — Progress Notes (Signed)
HPI: Patient returns for followup after last visit with Dr. Meryl Dare to 24 2014. He had in addition to the hospital for stroke 01/28/2012. He presented with sudden onset of dysarthria and right face and hand weakness but presented beyond the window for aggressive intervention. MRI of the brain showed an acute left basal ganglial infarct as well as a remote age right basal ganglia hemorrhagic infarct. Carotid ultrasound and transthoracic echo were unremarkable patient has vascular risk factors of diabetes hyperlipidemia and hypertension. He was started on Plavix for secondary stroke prevention. He has minimal bruising from the medication he is continuing to do his home exercise program after therapies concluded. He continues to complain of mild cognitive memory disc difficulty since his stroke.  ROS:  Easy bruising, increased thirst, decreased energy and fatigue  Physical Exam General: well developed, well nourished, seated, in no evident distress Head: head normocephalic and atraumatic. Oropharynx benign Neck: supple with no carotid  bruits Cardiovascular: regular rate and rhythm, no murmurs  Neurologic Exam Mental Status: Awake and fully alert. Oriented to place and time.MMSE30/30. GDS=1 Follows all commands. Speech and language normal.   Cranial Nerves: Fundoscopic exam reveals sharp disc margins. Pupils equal, briskly reactive to light. Extraocular movements full without nystagmus. Visual fields full to confrontation. Hard of Hearing.. Facial sensation intact. Face, tongue, palate move normally and symmetrically. Neck flexion and extension normal.  Motor: Normal bulk and tone. Normal strength in all tested extremity muscles.No focal weekness Sensory.: intact to touch and pinprick and decreased vibratory to toes. .  Coordination: Rapid alternating movements normal in all extremities. Finger-to-nose and heel-to-shin performed accurately bilaterally. Gait and Station: Arises from chair without  difficulty. Stance is normal. Gait demonstrates normal stride length and balance . Able to heel, toe and tandem walk without difficulty.  Reflexes: 2+ and symmetric except absent ankle jerks. Toes downgoing.     ASSESSMENT: Left basal ganglier lacunar infarct from small vessel disease in September 2013 with multiple risk factors of diabetes, hypertension, hyperlipidemia and silent cerebrovascular disease mild cognitive impairment post stroke.     PLAN: Memory is stable, MMSE 30/30. GDS=1. Continue Plavix for secondary stroke prevention Continue  control of hypertension with blood pressure goal below 130/80 LDL cholesterol below 70 Hemoglobin A1c below 6.5 Exercise by walking every day Followup in 6 months [name], GNP-BC APRN

## 2013-04-03 ENCOUNTER — Ambulatory Visit: Payer: Medicare Other | Admitting: Nurse Practitioner

## 2013-06-07 ENCOUNTER — Encounter (INDEPENDENT_AMBULATORY_CARE_PROVIDER_SITE_OTHER): Payer: Self-pay

## 2013-06-07 ENCOUNTER — Encounter: Payer: Self-pay | Admitting: Nurse Practitioner

## 2013-06-07 ENCOUNTER — Ambulatory Visit (INDEPENDENT_AMBULATORY_CARE_PROVIDER_SITE_OTHER): Payer: Medicare Other | Admitting: Nurse Practitioner

## 2013-06-07 VITALS — BP 140/70 | HR 71 | Ht 70.0 in | Wt 184.0 lb

## 2013-06-07 DIAGNOSIS — R413 Other amnesia: Secondary | ICD-10-CM

## 2013-06-07 DIAGNOSIS — I6340 Cerebral infarction due to embolism of unspecified cerebral artery: Secondary | ICD-10-CM

## 2013-06-07 NOTE — Progress Notes (Signed)
[facility]  PATIENT: Tyler [name] DOB: [birth year]   REASON FOR VISIT: Followup for memory loss and stroke   HISTORY OF PRESENT ILLNESS: Mr. Choe, 80 year old male returns for followup. He has a history of left basal ganglial infarct 01/28/2012. He also has mild cognitive memory difficulty since his stroke which is stable. He denies further stroke or TIA symptoms. He remains active, driving etc. He claims that his recent cholesterol and hemoglobin A1c were normal. He returns for reevaluation.   HISTORY: stroke 01/28/2012. He presented with sudden onset of dysarthria and right face and hand weakness but presented beyond the window for aggressive intervention. MRI of the brain showed an acute left basal ganglial infarct as well as a remote age right basal ganglia hemorrhagic infarct. Carotid ultrasound and transthoracic echo were unremarkable patient has vascular risk factors of diabetes hyperlipidemia and hypertension. He was started on Plavix for secondary stroke prevention. He has minimal bruising from the medication he is continuing to do his home exercise program after therapies concluded. He continues to complain of mild cognitive memory difficulty since his stroke.  REVIEW OF SYSTEMS: Full 14 system review of systems performed and notable only for those listed, all others are neg:  Constitutional: N/A  Cardiovascular: N/A  Ear/Nose/Throat: N/A  Skin: N/A  Eyes: N/A  Respiratory: N/A  Gastroitestinal: N/A  Hematology/Lymphatic: N/A  Endocrine: N/A Musculoskeletal: Arthritis  Allergy/Immunology: N/A  Neurological: N/A Psychiatric: N/A   ALLERGIES: Allergies  Allergen Reactions  . Sulfa Drugs Cross Reactors Rash    HOME MEDICATIONS: Outpatient Prescriptions Prior to Visit  Medication Sig Dispense Refill  . amLODipine (NORVASC) 2.5 MG tablet 2.5 mg daily.      . Cholecalciferol (VITAMIN D-3 PO) Take 1 tablet by mouth daily.      . clopidogrel (PLAVIX)  75 MG tablet Take 1 tablet (75 mg total) by mouth daily with breakfast.  30 tablet  1  . Cyanocobalamin (VITAMIN B-12 PO) Take 1 tablet by mouth daily.      . fish oil-omega-3 fatty acids 1000 MG capsule Take 1 g by mouth daily.      . Flaxseed, Linseed, (FLAX SEEDS PO) Take 1 tablet by mouth daily.      [name] glimepiride (AMARYL) 4 MG tablet Take 1 tablet (4 mg total) by mouth daily before breakfast.  30 tablet  1  . Lancets (ONETOUCH ULTRASOFT) lancets       . losartan (COZAAR) 25 MG tablet Take 2 tablets (50 mg total) by mouth daily.  30 tablet  0  . metoprolol tartrate (LOPRESSOR) 12.5 mg TABS Take 0.5 tablets (12.5 mg total) by mouth 2 (two) times daily.  60 tablet  1  . ONE TOUCH ULTRA TEST test strip       . pantoprazole (PROTONIX) 40 MG tablet Take 1 tablet (40 mg total) by mouth daily.  30 tablet  1  . Tamsulosin HCl (FLOMAX) 0.4 MG CAPS Take 1 tablet by mouth Daily.      . pravastatin (PRAVACHOL) 80 MG tablet Take 80 mg by mouth daily.       No facility-administered medications prior to visit.    PAST MEDICAL HISTORY: Past Medical History  Diagnosis Date  . Hypertension   . Cancer   . Stroke   . Kidney stones   . Memory loss     PAST SURGICAL HISTORY: Past Surgical History  Procedure Laterality Date  . Prostate surgery    . Cardiac catheterization  FAMILY HISTORY: Family History  Problem Relation Age of Onset  . Emphysema Father   . Heart Problems Mother     SOCIAL HISTORY: History   Social History  . Marital Status: Married    Spouse Name: Marland Mcalpine    Number of Children: 2  . Years of Education: 12   Occupational History  . retired     Orthoptist   Social History Main Topics  . Smoking status: Former Smoker    Quit date: 05/04/1968  . Smokeless tobacco: Never Used  . Alcohol Use: No  . Drug Use: No  . Sexual Activity: Not on file   Other Topics Concern  . Not on file   Social History Narrative   Patient lives at home with his wife [name])  retired. Patient has a high school education. Two Children . No caffeine. Right handed.     PHYSICAL EXAM  Filed Vitals:   06/07/13 1432  BP: 156/59  Pulse: 71  Height: 5\' 10"  (1.778 m)  Weight: 184 lb (83.462 kg)   Body mass index is 26.4 kg/(m^2).  Generalized: Well developed, in no acute distress  Head: normocephalic and atraumatic,. Oropharynx benign  Neck: Supple, no carotid bruits  Cardiac: Regular rate rhythm, no murmur  Musculoskeletal: No deformity   Neurological examination   Mentation: Alert oriented to time, place, history taking. MMSE 30/30/ AFT 12.Follows all commands speech and language fluent  Cranial nerve II-XII: Pupils were equal round reactive to light extraocular movements were full, visual field were full on confrontational test. Facial sensation and strength were normal. hearing was intact to finger rubbing bilaterally. Uvula tongue midline. head turning and shoulder shrug were normal and symmetric.Tongue protrusion into cheek strength was normal. Motor: normal bulk and tone, full strength in the BUE, BLE, fine finger movements normal, no pronator drift. No focal weakness Coordination: finger-nose-finger, heel-to-shin bilaterally, no dysmetria Reflexes: Brachioradialis 2/2, biceps 2/2, triceps 2/2, patellar 2/2, Achilles absent plantar responses were flexor bilaterally. Gait and Station: Rising up from seated position without assistance, normal stance,  moderate stride, good arm swing, smooth turning, able to perform tiptoe, and heel walking without difficulty. Tandem gait is steady  DIAGNOSTIC DATA (LABS, IMAGING, TESTING) -   ASSESSMENT AND PLAN  80 y.o. year old male  has a past medical history of Hypertension; Cancer; Stroke; Kidney stones; and Memory loss. here to followup. His MMSE is 30 out of 30. He has not had further stroke or TIA symptoms and remains on Plavix for secondary stroke prevention.  Continue Plavix for secondary stroke  prevention Control of blood pressure with systolic 546/56 or below LDL cholesterol was 70 Hemoglobin A1c 6.5 point Exercise by walking every day Followup yearly Dennie Bible, [facility], [facility], APRN  [facility] [address] [place], [place] [address] [phone]

## 2013-06-07 NOTE — Patient Instructions (Signed)
Memory is stable Continue Plavix for secondary stroke prevention Control of blood pressure with systolic 017/79 or below LDL cholesterol was 70 Hemoglobin A1c 6.5 point Exercise by walking every day Followup yearly

## 2013-10-23 ENCOUNTER — Other Ambulatory Visit: Payer: Self-pay | Admitting: Dermatology

## 2014-03-21 ENCOUNTER — Encounter: Payer: Self-pay | Admitting: Neurology

## 2014-03-27 ENCOUNTER — Encounter: Payer: Self-pay | Admitting: Neurology

## 2014-06-07 ENCOUNTER — Ambulatory Visit: Payer: Medicare Other | Admitting: Neurology

## 2014-06-07 ENCOUNTER — Encounter: Payer: Self-pay | Admitting: Neurology

## 2014-06-07 ENCOUNTER — Ambulatory Visit (INDEPENDENT_AMBULATORY_CARE_PROVIDER_SITE_OTHER): Payer: PPO | Admitting: Neurology

## 2014-06-07 VITALS — BP 149/68 | HR 73 | Ht 70.0 in | Wt 180.8 lb

## 2014-06-07 DIAGNOSIS — F482 Pseudobulbar affect: Secondary | ICD-10-CM | POA: Insufficient documentation

## 2014-06-07 DIAGNOSIS — I6529 Occlusion and stenosis of unspecified carotid artery: Secondary | ICD-10-CM

## 2014-06-07 MED ORDER — DEXTROMETHORPHAN-QUINIDINE 20-10 MG PO CAPS: 1.0000 | ORAL_CAPSULE | Freq: Two times a day (BID) | ORAL | Status: DC

## 2014-06-07 NOTE — Progress Notes (Signed)
[facility]  PATIENT: Tyler [name] DOB: 1928-07-07   REASON FOR VISIT: Followup for memory loss and stroke   HISTORY OF PRESENT ILLNESS: Mr. Feil, 81 year old male returns for followup. He has a history of left basal ganglial infarct 01/28/2012. He also has mild cognitive memory difficulty since his stroke which is stable. He denies further stroke or TIA symptoms. He remains active, driving etc. He claims that his recent cholesterol and hemoglobin A1c were normal. He returns for reevaluation.   HISTORY: stroke 01/28/2012. He presented with sudden onset of dysarthria and right face and hand weakness but presented beyond the window for aggressive intervention. MRI of the brain showed an acute left basal ganglial infarct as well as a remote age right basal ganglia hemorrhagic infarct. Carotid ultrasound and transthoracic echo were unremarkable patient has vascular risk factors of diabetes hyperlipidemia and hypertension. He was started on Plavix for secondary stroke prevention. He has minimal bruising from the medication he is continuing to do his home exercise program after therapies concluded. He continues to complain of mild cognitive memory difficulty since his stroke. Update 06/07/2014 : He returns for follow-up after last visit 1 year ago. He is doing well from neurovascular standpoint without recurrent stroke or TIA symptoms. He states his memory difficulties are also unchanged though he does have some difficulty remembering names at times but he can remember them a little while later. He is independent in activities of daily living and managing his affairs quite well. He remains on Plavix for stroke prevention and does tend to bruise easily but has not had any bleeding episodes. He states his blood pressure is quite well controlled though it is slightly elevated at 149/68 in office today. He cannot remember the last time he had lipid profile checked but he does have an  upcoming visit with his primary physician next week when he is likely to have lab work checked. His fasting sugars he have been consistently ranging between 80-110 range. He has a new complaint today of emotional lability. He gets upset and starts crying easily while watching some sad news over the television or listening to people. At times he has also laughed a little inappropriately. He has been drooling a lot but he thinks this maybe related to his dentures and he plans to replace them soon. He denies feeling depressed. REVIEW OF SYSTEMS: Full 14 system review of systems performed and notable only for those listed, all others are neg: Easy bruising, eye itching, hearing loss, drooling, frequent waking, frequency of urination, excessive and inappropriate crying and laughing.    ALLERGIES: Allergies  Allergen Reactions  . Sulfa Drugs Cross Reactors Rash    HOME MEDICATIONS: Outpatient Prescriptions Prior to Visit  Medication Sig Dispense Refill  . amLODipine (NORVASC) 2.5 MG tablet 2.5 mg daily.    [name] atorvastatin (LIPITOR) 20 MG tablet Take 20 mg by mouth daily.    . clopidogrel (PLAVIX) 75 MG tablet Take 1 tablet (75 mg total) by mouth daily with breakfast. 30 tablet 1  . fish oil-omega-3 fatty acids 1000 MG capsule Take 1 g by mouth daily.    . Lancets (ONETOUCH ULTRASOFT) lancets     . metoprolol tartrate (LOPRESSOR) 12.5 mg TABS Take 0.5 tablets (12.5 mg total) by mouth 2 (two) times daily. 60 tablet 1  . ONE TOUCH ULTRA TEST test strip     . pantoprazole (PROTONIX) 40 MG tablet Take 1 tablet (40 mg total) by mouth daily. 30 tablet 1  .  Tamsulosin HCl (FLOMAX) 0.4 MG CAPS Take 1 tablet by mouth Daily.    . Cholecalciferol (VITAMIN D-3 PO) Take 1 tablet by mouth daily.    . Cyanocobalamin (VITAMIN B-12 PO) Take 1 tablet by mouth daily.    . Flaxseed, Linseed, (FLAX SEEDS PO) Take 1 tablet by mouth daily.    [name] glimepiride (AMARYL) 4 MG tablet Take 1 tablet (4 mg total) by mouth daily  before breakfast. 30 tablet 1  . losartan (COZAAR) 25 MG tablet Take 2 tablets (50 mg total) by mouth daily. 30 tablet 0   No facility-administered medications prior to visit.    PAST MEDICAL HISTORY: Past Medical History  Diagnosis Date  . Hypertension   . Cancer   . Stroke   . Kidney stones   . Memory loss     PAST SURGICAL HISTORY: Past Surgical History  Procedure Laterality Date  . Prostate surgery    . Cardiac catheterization      FAMILY HISTORY: Family History  Problem Relation Age of Onset  . Emphysema Father   . Heart Problems Mother     SOCIAL HISTORY: History   Social History  . Marital Status: Married    Spouse Name: Marland Mcalpine    Number of Children: 2  . Years of Education: 12   Occupational History  . retired     Orthoptist   Social History Main Topics  . Smoking status: Former Smoker    Quit date: 05/04/1968  . Smokeless tobacco: Never Used  . Alcohol Use: No  . Drug Use: No  . Sexual Activity: Not on file   Other Topics Concern  . Not on file   Social History Narrative   Patient lives at home with his wife [name]) retired. Patient has a high school education. Two Children . No caffeine. Right handed.     PHYSICAL EXAM  Filed Vitals:   06/07/14 1029  BP: 149/68  Pulse: 73  Height: 5\' 10"  (1.778 m)  Weight: 180 lb 12.8 oz (82.01 kg)   Body mass index is 25.94 kg/(m^2).  Generalized: Well developed elderly Caucasian male, in no acute distress  Head: normocephalic and atraumatic,. Oropharynx benign  Neck: Supple, no carotid bruits  Cardiac: Regular rate rhythm, no murmur  Musculoskeletal: No deformity   Neurological examination   Mentation: Alert oriented to time, place, history taking. MMSE 29/30/ with 1 deficit in recall only. AFT 16. Clock drawing 4/4. Geriatric depression scale 3 only not depressed.Follows all commands speech and language fluent  Cranial nerve II-XII: Pupils were equal round reactive to light extraocular  movements were full, visual field were full on confrontational test. Facial sensation and strength were normal. hearing was intact to finger rubbing bilaterally. Uvula tongue midline. head turning and shoulder shrug were normal and symmetric.Tongue protrusion into cheek strength was normal. Motor: normal bulk and tone, full strength in the BUE, BLE, fine finger movements normal, no pronator drift. No focal weakness. Diminished fine finger movements on the right. Orbits left over right upper extremity. Coordination: finger-nose-finger, heel-to-shin bilaterally, no dysmetria Reflexes: Brachioradialis 2/2, biceps 2/2, triceps 2/2, patellar 2/2, Achilles absent plantar responses were flexor bilaterally. Gait and Station: Rising up from seated position without assistance, normal stance,  moderate stride, good arm swing, slight dragging of the right leg. Tandem gait is slightly unsteady  DIAGNOSTIC DATA (LABS, IMAGING, TESTING) -   ASSESSMENT AND PLAN  81 y.o. year old male  has a past medical history of Hypertension; Cancer; Stroke; Kidney stones;  and Memory loss. here to followup. His MMSE is 29 out of 30. He has not had further stroke or TIA symptoms and remains on Plavix for secondary stroke prevention. New complaints of inappropriate crying and laughter likely due to pseudobulbar affect may be a late effect of his subcortical stroke PLAN : I had a long d/w patient about his remote stroke, risk for recurrent stroke/TIAs, personally independently reviewed imaging studies and stroke evaluation results and answered questions.Continue Plavix  for secondary stroke prevention and maintain strict control of hypertension with blood pressure goal below 130/90, diabetes with hemoglobin A1c goal below 6.5% and lipids with LDL cholesterol goal below 100 mg/dL. I also advised the patient to eat a healthy diet with plenty of whole grains, cereals, fruits and vegetables, exercise regularly and maintain ideal body weight  .Trial of Nuedexta for his emotional lability and pseudobulbar affect..Check screening carotid and Transcranial dopplers studies.Followup in the future with Cecille Rubin, NP in 3 months or call earlier if necessary.   Antony Contras, MD [facility] [address], [place], [place] [address] [phone]

## 2014-06-07 NOTE — Patient Instructions (Addendum)
I had a long d/w patient about his remote stroke, risk for recurrent stroke/TIAs, personally independently reviewed imaging studies and stroke evaluation results and answered questions.Continue Plavix  for secondary stroke prevention and maintain strict control of hypertension with blood pressure goal below 130/90, diabetes with hemoglobin A1c goal below 6.5% and lipids with LDL cholesterol goal below 100 mg/dL. I also advised the patient to eat a healthy diet with plenty of whole grains, cereals, fruits and vegetables, exercise regularly and maintain ideal body weight .Trial of Nuedexta for his emotional lability and pseudobulbar affect..Check screening carotid and Transcranial dopplers studies.Followup in the future with Cecille Rubin, NP in 3 months or call earlier if necessary.

## 2014-06-14 ENCOUNTER — Ambulatory Visit (INDEPENDENT_AMBULATORY_CARE_PROVIDER_SITE_OTHER): Payer: PPO

## 2014-06-14 DIAGNOSIS — F482 Pseudobulbar affect: Secondary | ICD-10-CM

## 2014-06-14 DIAGNOSIS — I6529 Occlusion and stenosis of unspecified carotid artery: Secondary | ICD-10-CM

## 2014-06-14 DIAGNOSIS — Z0289 Encounter for other administrative examinations: Secondary | ICD-10-CM

## 2014-09-17 ENCOUNTER — Ambulatory Visit (INDEPENDENT_AMBULATORY_CARE_PROVIDER_SITE_OTHER): Payer: PPO | Admitting: Nurse Practitioner

## 2014-09-17 ENCOUNTER — Telehealth: Payer: Self-pay

## 2014-09-17 ENCOUNTER — Encounter: Payer: Self-pay | Admitting: Nurse Practitioner

## 2014-09-17 VITALS — BP 136/71 | HR 63 | Ht 70.0 in | Wt 174.0 lb

## 2014-09-17 DIAGNOSIS — F482 Pseudobulbar affect: Secondary | ICD-10-CM

## 2014-09-17 DIAGNOSIS — I10 Essential (primary) hypertension: Secondary | ICD-10-CM

## 2014-09-17 DIAGNOSIS — R413 Other amnesia: Secondary | ICD-10-CM | POA: Diagnosis not present

## 2014-09-17 DIAGNOSIS — I639 Cerebral infarction, unspecified: Secondary | ICD-10-CM

## 2014-09-17 NOTE — Telephone Encounter (Signed)
[employer] has approved the request for coverage on Nuedexta effective until 05/04/2015 Ref ID # [id number]

## 2014-09-17 NOTE — Telephone Encounter (Signed)
I contacted patient's ins, [employer] and provided clinical info regarding [place].  The request for coverage is currently being reviewed Ref Key: [id number]

## 2014-09-17 NOTE — Progress Notes (Addendum)
[facility]  PATIENT: Tyler [name] DOB: [birth year]   REASON FOR VISIT: Follow-up for stroke, memory loss, and pseudobulbar affect History from :patient  HISTORY OF PRESENT ILLNESS:Tyler [name], 81 year old male returns for followup. He has a history of left basal ganglial infarct 01/28/2012. He also has mild cognitive memory difficulty since his stroke which is stable. He denies further stroke or TIA symptoms. He remains active, driving etc. He claims that his recent cholesterol and hemoglobin A1c were normal. He returns for reevaluation.   HISTORY: stroke 01/28/2012. He presented with sudden onset of dysarthria and right face and hand weakness but presented beyond the window for aggressive intervention. MRI of the brain showed an acute left basal ganglial infarct as well as a remote age right basal ganglia hemorrhagic infarct. Carotid ultrasound and transthoracic echo were unremarkable patient has vascular risk factors of diabetes hyperlipidemia and hypertension. He was started on Plavix for secondary stroke prevention. He has minimal bruising from the medication he is continuing to do his home exercise program after therapies concluded. He continues to complain of mild cognitive memory difficulty since his stroke. Update 06/07/2014 : He returns for follow-up after last visit 1 year ago. He is doing well from neurovascular standpoint without recurrent stroke or TIA symptoms. He states his memory difficulties are also unchanged though he does have some difficulty remembering names at times but he can remember them a little while later. He is independent in activities of daily living and managing his affairs quite well. He remains on Plavix for stroke prevention and does tend to bruise easily but has not had any bleeding episodes. He states his blood pressure is quite well controlled though it is slightly elevated at 149/68 in office today. He cannot remember the last time he had lipid  profile checked but he does have an upcoming visit with his primary physician next week when he is likely to have lab work checked. His fasting sugars he have been consistently ranging between 80-110 range. He has a new complaint today of emotional lability. He gets upset and starts crying easily while watching some sad news over the television or listening to people. At times he has also laughed a little inappropriately. He has been drooling a lot but he thinks this maybe related to his dentures and he plans to replace them soon. He denies feeling depressed. UPDATE 09/17/14 . Tyler [name], 81 year old male returns for follow-up. He was last seen in the office by Dr. Leonie Man 06/07/2014. He is doing well from neurovascular standpoint without recurrent stroke or TIA symptoms. He feels his memory is unchanged. He is driving without difficulty. He performs activities of daily living and manages his affairs quite well. He is currently on Plavix with minimal bruising secondary stroke prevention. His blood pressure is well controlled and is 136/71 in office today. He claims his fasting sugars ranging anywhere from 90-110. He continues to have emotional lability, he gets upset, can  easily cry while watching sad news or activity program. He can also laughed inappropriately. He denies being depressed. He continues to walk for exercise carotid Doppler done 02//14/2016 was negative for significant stenosis involving the extracranial carotid arteries. He returns for reevaluation  REVIEW OF SYSTEMS: Full 14 system review of systems performed and notable only for those listed, all others are neg:  Constitutional: fatigue  Cardiovascular: neg Ear/Nose/Throat: neg  Skin: neg Eyes: neg Respiratory: neg Gastroitestinal: Urinary frequency Hematology/Lymphatic: neg  Endocrine: neg Musculoskeletal:back pain, walking difficulty Allergy/Immunology: neg Neurological:  Memory loss, history of stroke inappropriate  crying Psychiatric: neg Sleep : neg   ALLERGIES: Allergies  Allergen Reactions  . Sulfa Drugs Cross Reactors Rash    HOME MEDICATIONS: Outpatient Prescriptions Prior to Visit  Medication Sig Dispense Refill  . amLODipine (NORVASC) 2.5 MG tablet 2.5 mg daily.    [name] atorvastatin (LIPITOR) 20 MG tablet Take 20 mg by mouth daily.    . clopidogrel (PLAVIX) 75 MG tablet Take 1 tablet (75 mg total) by mouth daily with breakfast. 30 tablet 1  . cyanocobalamin 1000 MCG tablet Take 1,000 mcg by mouth daily.     . fish oil-omega-3 fatty acids 1000 MG capsule Take 2 g by mouth daily.     [name] glimepiride (AMARYL) 2 MG tablet Take 2 mg by mouth daily.    . Lancets (ONETOUCH ULTRASOFT) lancets     . losartan (COZAAR) 50 MG tablet Take 50 mg by mouth daily.    . metoprolol tartrate (LOPRESSOR) 12.5 mg TABS Take 0.5 tablets (12.5 mg total) by mouth 2 (two) times daily. 60 tablet 1  . ONE TOUCH ULTRA TEST test strip     . pantoprazole (PROTONIX) 40 MG tablet Take 1 tablet (40 mg total) by mouth daily. 30 tablet 1  . Tamsulosin HCl (FLOMAX) 0.4 MG CAPS Take 1 tablet by mouth Daily.    . Cholecalciferol 10000 UNITS CAPS Take 1,000 Units by mouth daily.    [name] Dextromethorphan-Quinidine (NUEDEXTA) 20-10 MG CAPS Take 1 tablet by mouth 2 (two) times daily. Start one tablet daily x 1 week then twice daily (Patient not taking: Reported on 09/17/2014) 60 capsule 2   No facility-administered medications prior to visit.    PAST MEDICAL HISTORY: Past Medical History  Diagnosis Date  . Hypertension   . Cancer   . Stroke   . Kidney stones   . Memory loss     PAST SURGICAL HISTORY: Past Surgical History  Procedure Laterality Date  . Prostate surgery    . Cardiac catheterization      FAMILY HISTORY: Family History  Problem Relation Age of Onset  . Emphysema Father   . Heart Problems Mother     SOCIAL HISTORY: History   Social History  . Marital Status: Married    Spouse Name: Marland Mcalpine  .  Number of Children: 2  . Years of Education: 12   Occupational History  . retired     Orthoptist   Social History Main Topics  . Smoking status: Former Smoker    Quit date: 05/04/1968  . Smokeless tobacco: Never Used  . Alcohol Use: No  . Drug Use: No  . Sexual Activity: Not on file   Other Topics Concern  . Not on file   Social History Narrative   Patient lives at home with his wife [name]) retired. Patient has a high school education. Two Children . No caffeine. Right handed.     PHYSICAL EXAM  Filed Vitals:   09/17/14 1007  BP: 136/71  Pulse: 63  Height: 5\' 10"  (1.778 m)  Weight: 174 lb (78.926 kg)   Body mass index is 24.97 kg/(m^2). Generalized: Well developed elderly Caucasian male, in no acute distress  Head: normocephalic and atraumatic,. Oropharynx benign  Neck: Supple, no carotid bruits  Cardiac: Regular rate rhythm, no murmur  Musculoskeletal: No deformity   Neurological examination   Mentation: Alert oriented to time, place, history taking. MMSE 30/30/. AFT 14. Clock drawing 4/4. Follows all commands speech and language fluent  Cranial  nerve II-XII: Pupils were equal round reactive to light extraocular movements were full, visual field were full on confrontational test. Facial sensation and strength were normal. hearing was intact to finger rubbing bilaterally. Uvula tongue midline. head turning and shoulder shrug were normal and symmetric.Tongue protrusion into cheek strength was normal. Motor: normal bulk and tone, full strength in the BUE, BLE, fine finger movements normal, no pronator drift. No focal weakness. Diminished fine finger movements on the right. Orbits left over right upper extremity. Coordination: finger-nose-finger, heel-to-shin bilaterally, no dysmetria Reflexes: Brachioradialis 2/2, biceps 2/2, triceps 2/2, patellar 2/2, Achilles absent plantar responses were flexor bilaterally. Gait and Station: Rising up from seated position without  assistance, normal stance, moderate stride, good arm swing,  Tandem gait is slightly unsteady. No assistive device   DIAGNOSTIC DATA (LABS, IMAGING, TESTING)  ASSESSMENT AND PLAN  81 y.o. year old male  has a past medical history of Hypertension; Stroke; Kidney stones; and Memory loss and pseudobulbar affect here to follow-up. Memory score is stable at 30 out of 30 no further stroke or TIA symptoms. Most recent carotid Doppler 06/17/2014 was negative for significant stenosis involving the extracranial carotid arteries bilaterally. TCD was abnormal most  likely due to  Atherosclerosis. The patient is a current patient of Dr. Leonie Man  who is out of the office today . This note is sent to the work in doctor.     Continue Plavix for secondary stroke prevention will refill Maintain strict control of hypertension with blood pressure goal below 130/90 today's reading 136/71 Diabetes with hemoglobin A1c below 6.5 LDL cholesterol goal below 80 Exercise regularly by walking Begin Nuedexta one capsule daily for 1 week then increase to 1 capsule twice daily (will obtain PA) Follow-up in 6 months Dennie Bible, [facility], [facility], APRN   Agree with examination,history, physical, neuro exam,assessment and plan as stated above.   Sarina Ill, MD Stroke Neurology [phone] [facility]   [facility] [address], [place],  [address] [phone]

## 2014-09-17 NOTE — Patient Instructions (Signed)
Continue Plavix for secondary stroke prevention will refill Maintain strict control of hypertension with blood pressure goal below 130/90 Diabetes with hemoglobin A1c below 6.5 LDL cholesterol goal below 80 Exercise regularly by walking Begin Nuedexta one capsule daily for 1 week then increase to 1 capsule twice daily Follow-up in 6 months

## 2014-10-18 ENCOUNTER — Other Ambulatory Visit: Payer: Self-pay | Admitting: Dermatology

## 2015-01-08 ENCOUNTER — Other Ambulatory Visit (HOSPITAL_COMMUNITY): Payer: Self-pay | Admitting: Urology

## 2015-01-08 DIAGNOSIS — C61 Malignant neoplasm of prostate: Secondary | ICD-10-CM

## 2015-01-22 ENCOUNTER — Encounter (HOSPITAL_COMMUNITY)
Admission: RE | Admit: 2015-01-22 | Discharge: 2015-01-22 | Disposition: A | Discharge status: Home / Self Care | Payer: PPO | Source: Ambulatory Visit | Attending: Urology | Admitting: Urology

## 2015-01-22 ENCOUNTER — Other Ambulatory Visit (HOSPITAL_COMMUNITY): Payer: Self-pay

## 2015-01-22 DIAGNOSIS — C61 Malignant neoplasm of prostate: Secondary | ICD-10-CM

## 2015-01-22 MED ORDER — TECHNETIUM TC 99M MEDRONATE IV KIT
25.0000 | PACK | Freq: Once | INTRAVENOUS | Status: AC | PRN
  Administered 2015-01-22: 23.1 via INTRAVENOUS

## 2015-03-20 ENCOUNTER — Ambulatory Visit (INDEPENDENT_AMBULATORY_CARE_PROVIDER_SITE_OTHER): Payer: PPO | Admitting: Nurse Practitioner

## 2015-03-20 ENCOUNTER — Encounter: Payer: Self-pay | Admitting: Nurse Practitioner

## 2015-03-20 VITALS — BP 152/70 | HR 66 | Ht 70.0 in | Wt 178.8 lb

## 2015-03-20 DIAGNOSIS — F482 Pseudobulbar affect: Secondary | ICD-10-CM

## 2015-03-20 DIAGNOSIS — I10 Essential (primary) hypertension: Secondary | ICD-10-CM

## 2015-03-20 DIAGNOSIS — I638 Other cerebral infarction: Secondary | ICD-10-CM

## 2015-03-20 DIAGNOSIS — E785 Hyperlipidemia, unspecified: Secondary | ICD-10-CM

## 2015-03-20 DIAGNOSIS — I6389 Other cerebral infarction: Secondary | ICD-10-CM

## 2015-03-20 DIAGNOSIS — R413 Other amnesia: Secondary | ICD-10-CM | POA: Diagnosis not present

## 2015-03-20 MED ORDER — CLOPIDOGREL BISULFATE 75 MG PO TABS: 75.0000 mg | ORAL_TABLET | Freq: Every day | ORAL | Status: DC

## 2015-03-20 NOTE — Progress Notes (Signed)
[facility]  PATIENT: Tyler [name] DOB: 05-14-1928   REASON FOR VISIT: Memory loss,  cerebral infarction, essential hypertension pseudobulbar affect HISTORY FROM: Patient    HISTORY OF PRESENT ILLNESS:Tyler [name], 81 year old male returns for followup. He has a history of left basal ganglial infarct 01/28/2012. He also has mild cognitive memory difficulty since his stroke which is stable. He denies further stroke or TIA symptoms. He remains active, driving etc. He claims that his recent cholesterol and hemoglobin A1c were normal. He returns for reevaluation.   HISTORY: stroke 01/28/2012. He presented with sudden onset of dysarthria and right face and hand weakness but presented beyond the window for aggressive intervention. MRI of the brain showed an acute left basal ganglial infarct as well as a remote age right basal ganglia hemorrhagic infarct. Carotid ultrasound and transthoracic echo were unremarkable patient has vascular risk factors of diabetes hyperlipidemia and hypertension. He was started on Plavix for secondary stroke prevention. He has minimal bruising from the medication he is continuing to do his home exercise program after therapies concluded. He continues to complain of mild cognitive memory difficulty since his stroke. Update 06/07/2014 : He returns for follow-up after last visit 1 year ago. He is doing well from neurovascular standpoint without recurrent stroke or TIA symptoms. He states his memory difficulties are also unchanged though he does have some difficulty remembering names at times but he can remember them a little while later. He is independent in activities of daily living and managing his affairs quite well. He remains on Plavix for stroke prevention and does tend to bruise easily but has not had any bleeding episodes. He states his blood pressure is quite well controlled though it is slightly elevated at 149/68 in office today. He cannot remember the  last time he had lipid profile checked but he does have an upcoming visit with his primary physician next week when he is likely to have lab work checked. His fasting sugars he have been consistently ranging between 80-110 range. He has a new complaint today of emotional lability. He gets upset and starts crying easily while watching some sad news over the television or listening to people. At times he has also laughed a little inappropriately. He has been drooling a lot but he thinks this maybe related to his dentures and he plans to replace them soon. He denies feeling depressed. UPDATE 03/20/15. Tyler [name], 81 year old male returns for follow-up.  He is doing well from neurovascular standpoint without recurrent stroke or TIA symptoms. He feels his memory is unchanged. He is driving without difficulty. He performs activities of daily living and manages his affairs quite well. He is currently on Plavix with minimal bruising secondary stroke prevention. His blood pressure is well controlled and is 136/71 in office today. He claims his fasting sugars ranging anywhere from 90-110. He continues to have emotional lability, but it is much improved with Nuedexta which was started after last visit 6 months ago. He no longer gets upset,  easily cries etc,  He no longer  laughs inappropriately. He denies being depressed. He continues to walk for exercise Carotid Doppler done 02//14/2016 was negative for significant stenosis involving the extracranial carotid arteries. He returns for reevaluation    REVIEW OF SYSTEMS: Full 14 system review of systems performed and notable only for those listed, all others are neg:  Constitutional: neg  Cardiovascular: neg Ear/Nose/Throat: neg  Skin: neg Eyes: neg Respiratory: neg Gastroitestinal: neg  Hematology/Lymphatic: neg  Endocrine: neg  Musculoskeletal:neg Allergy/Immunology: neg Neurological: neg Psychiatric: neg Sleep : neg   ALLERGIES: Allergies  Allergen  Reactions  . Sulfa Drugs Cross Reactors Rash    HOME MEDICATIONS: Outpatient Prescriptions Prior to Visit  Medication Sig Dispense Refill  . amLODipine (NORVASC) 2.5 MG tablet 2.5 mg daily.    [name] atorvastatin (LIPITOR) 20 MG tablet Take 20 mg by mouth daily.    . cholecalciferol (VITAMIN D) 1000 UNITS tablet Take 1,000 Units by mouth daily.    . clopidogrel (PLAVIX) 75 MG tablet Take 1 tablet (75 mg total) by mouth daily with breakfast. 30 tablet 1  . cyanocobalamin 1000 MCG tablet Take 1,000 mcg by mouth daily.     [name] Dextromethorphan-Quinidine 20-10 MG CAPS Take 1 capsule by mouth. 1 capsule daily for 1 week then increase to 1 capsule twice daily    . fish oil-omega-3 fatty acids 1000 MG capsule Take 2 g by mouth daily.     [name] glimepiride (AMARYL) 2 MG tablet Take 2 mg by mouth daily.    . Lancets (ONETOUCH ULTRASOFT) lancets     . losartan (COZAAR) 50 MG tablet Take 50 mg by mouth daily.    . metoprolol tartrate (LOPRESSOR) 12.5 mg TABS Take 0.5 tablets (12.5 mg total) by mouth 2 (two) times daily. 60 tablet 1  . ONE TOUCH ULTRA TEST test strip     . pantoprazole (PROTONIX) 40 MG tablet Take 1 tablet (40 mg total) by mouth daily. 30 tablet 1  . Tamsulosin HCl (FLOMAX) 0.4 MG CAPS Take 1 tablet by mouth Daily.     No facility-administered medications prior to visit.    PAST MEDICAL HISTORY: Past Medical History  Diagnosis Date  . Hypertension   . Cancer ([place])   . Stroke ([place])   . Kidney stones   . Memory loss     PAST SURGICAL HISTORY: Past Surgical History  Procedure Laterality Date  . Prostate surgery    . Cardiac catheterization      FAMILY HISTORY: Family History  Problem Relation Age of Onset  . Emphysema Father   . Heart Problems Mother     SOCIAL HISTORY: Social History   Social History  . Marital Status: Married    Spouse Name: Marland Mcalpine  . Number of Children: 2  . Years of Education: 12   Occupational History  . retired     Orthoptist   Social History  Main Topics  . Smoking status: Former Smoker    Quit date: 05/04/1968  . Smokeless tobacco: Never Used  . Alcohol Use: No  . Drug Use: No  . Sexual Activity: Not on file   Other Topics Concern  . Not on file   Social History Narrative   Patient lives at home with his wife [name]) retired. Patient has a high school education. Two Children . No caffeine. Right handed.     PHYSICAL EXAM  Filed Vitals:   03/20/15 1402  BP: 152/70  Pulse: 66  Height: 5\' 10"  (1.778 m)  Weight: 178 lb 12.8 oz (81.103 kg)   Body mass index is 25.66 kg/(m^2). Generalized: Well developed elderly Caucasian male, in no acute distress  Head: normocephalic and atraumatic,. Oropharynx benign  Neck: Supple, no carotid bruits  Cardiac: Regular rate rhythm, no murmur  Musculoskeletal: No deformity   Neurological examination   Mentation: Alert oriented to time, place, history taking. MMSE 25/30/. AFT 16. Clock drawing 4/4. Follows all commands speech and language fluent  Cranial nerve II-XII: Pupils were  equal round reactive to light extraocular movements were full, visual field were full on confrontational test. Facial sensation and strength were normal. hearing was intact to finger rubbing bilaterally. Uvula tongue midline. head turning and shoulder shrug were normal and symmetric.Tongue protrusion into cheek strength was normal. Motor: normal bulk and tone, full strength in the BUE, BLE, fine finger movements normal, no pronator drift. No focal weakness. Diminished fine finger movements on the right. Orbits left over right upper extremity. Coordination: finger-nose-finger, heel-to-shin bilaterally, no dysmetria Reflexes: Brachioradialis 2/2, biceps 2/2, triceps 2/2, patellar 2/2, Achilles absent plantar responses were flexor bilaterally. Gait and Station: Rising up from seated position without assistance, normal stance, moderate stride, good arm swing, Tandem gait is slightly unsteady. No assistive  device     DIAGNOSTIC DATA (LABS, IMAGING, TESTING) -  ASSESSMENT AND PLAN 81 y.o. year old male has a past medical history of Hypertension; Stroke;  and Memory loss and pseudobulbar affect here to follow-up. Memory score is stable at 25 out of 30, no further stroke or TIA symptoms. Most recent carotid Doppler 06/17/2014 was negative for significant stenosis involving the extracranial carotid arteries bilaterally. TCD was abnormal most likely due to Atherosclerosis. Nuedexta is working well for his pseudobulbar  affect. The patient is a current patient of Dr. Leonie Man who is out of the office today . This note is sent to the work in doctor.   Continue Plavix for secondary stroke prevention will refill Maintain strict control of hypertension with blood pressure goal below 130/90 today's reading 152/70.  Diabetes with hemoglobin A1c below 6.5 LDL cholesterol goal below 80 Exercise regularly by walking Continue  Nuedexta 1 capsule twice daily for pseudobulbar affect. Follow-up in 6 months, next with Dr. Leonie Man. Vst time 20 min Dennie Bible, [facility], [facility], APRN  [facility] [address], [place], [place] [address] [phone]

## 2015-03-20 NOTE — Progress Notes (Signed)
I have read the note, and I agree with the clinical assessment and plan.  Sulay Brymer KEITH   

## 2015-03-20 NOTE — Patient Instructions (Addendum)
Continue Plavix for secondary stroke prevention will refill Maintain strict control of hypertension with blood pressure goal below 130/90 today's reading 152/70 Diabetes with hemoglobin A1c below 6.5 LDL cholesterol goal below 80 Exercise regularly by walking Continue  Nuedexta  to 1 capsule twice daily F/U in 6 months

## 2015-06-20 DIAGNOSIS — C44712 Basal cell carcinoma of skin of right lower limb, including hip: Secondary | ICD-10-CM | POA: Diagnosis not present

## 2015-06-25 DIAGNOSIS — E1165 Type 2 diabetes mellitus with hyperglycemia: Secondary | ICD-10-CM | POA: Diagnosis not present

## 2015-06-25 DIAGNOSIS — Z79899 Other long term (current) drug therapy: Secondary | ICD-10-CM | POA: Diagnosis not present

## 2015-06-25 DIAGNOSIS — E789 Disorder of lipoprotein metabolism, unspecified: Secondary | ICD-10-CM | POA: Diagnosis not present

## 2015-07-02 DIAGNOSIS — E789 Disorder of lipoprotein metabolism, unspecified: Secondary | ICD-10-CM | POA: Diagnosis not present

## 2015-07-02 DIAGNOSIS — E118 Type 2 diabetes mellitus with unspecified complications: Secondary | ICD-10-CM | POA: Diagnosis not present

## 2015-07-02 DIAGNOSIS — I10 Essential (primary) hypertension: Secondary | ICD-10-CM | POA: Diagnosis not present

## 2015-07-24 DIAGNOSIS — Z961 Presence of intraocular lens: Secondary | ICD-10-CM | POA: Diagnosis not present

## 2015-08-13 DIAGNOSIS — H43812 Vitreous degeneration, left eye: Secondary | ICD-10-CM | POA: Diagnosis not present

## 2015-08-15 DIAGNOSIS — D690 Allergic purpura: Secondary | ICD-10-CM | POA: Diagnosis not present

## 2015-08-15 DIAGNOSIS — D1801 Hemangioma of skin and subcutaneous tissue: Secondary | ICD-10-CM | POA: Diagnosis not present

## 2015-08-15 DIAGNOSIS — D692 Other nonthrombocytopenic purpura: Secondary | ICD-10-CM | POA: Diagnosis not present

## 2015-08-15 DIAGNOSIS — Z85828 Personal history of other malignant neoplasm of skin: Secondary | ICD-10-CM | POA: Diagnosis not present

## 2015-09-10 DIAGNOSIS — L570 Actinic keratosis: Secondary | ICD-10-CM | POA: Diagnosis not present

## 2015-09-24 ENCOUNTER — Ambulatory Visit (INDEPENDENT_AMBULATORY_CARE_PROVIDER_SITE_OTHER): Payer: PPO | Admitting: Neurology

## 2015-09-24 ENCOUNTER — Encounter: Payer: Self-pay | Admitting: Neurology

## 2015-09-24 VITALS — BP 155/62 | HR 67 | Ht 70.0 in | Wt 177.2 lb

## 2015-09-24 DIAGNOSIS — I6529 Occlusion and stenosis of unspecified carotid artery: Secondary | ICD-10-CM | POA: Diagnosis not present

## 2015-09-24 NOTE — Patient Instructions (Signed)
I had a long d/w patient about his remote stroke, risk for recurrent stroke/TIAs, personally independently reviewed imaging studies and stroke evaluation results and answered questions.Continue Plavix  for secondary stroke prevention and maintain strict control of hypertension with blood pressure goal below 130/90, diabetes with hemoglobin A1c goal below 6.5% and lipids with LDL cholesterol goal below 70 mg/dL. I also advised the patient to eat a healthy diet with plenty of whole grains, cereals, fruits and vegetables, exercise regularly and maintain ideal body weight , Check follow-up carotid ultrasound study Followup in the future with Cecille Rubin, nurse practitioner in one year or call earlier if necessary

## 2015-09-24 NOTE — Progress Notes (Signed)
[facility]  PATIENT: Tyler [name] DOB: 25-Jul-1928   REASON FOR VISIT: Memory loss,  cerebral infarction, essential hypertension pseudobulbar affect HISTORY FROM: Patient    HISTORY OF PRESENT ILLNESS:Tyler [name], 82 year old male returns for followup. He has a history of left basal ganglial infarct 01/28/2012. He also has mild cognitive memory difficulty since his stroke which is stable. He denies further stroke or TIA symptoms. He remains active, driving etc. He claims that his recent cholesterol and hemoglobin A1c were normal. He returns for reevaluation.   HISTORY: stroke 01/28/2012. He presented with sudden onset of dysarthria and right face and hand weakness but presented beyond the window for aggressive intervention. MRI of the brain showed an acute left basal ganglial infarct as well as a remote age right basal ganglia hemorrhagic infarct. Carotid ultrasound and transthoracic echo were unremarkable patient has vascular risk factors of diabetes hyperlipidemia and hypertension. He was started on Plavix for secondary stroke prevention. He has minimal bruising from the medication he is continuing to do his home exercise program after therapies concluded. He continues to complain of mild cognitive memory difficulty since his stroke. Update 06/07/2014 : He returns for follow-up after last visit 1 year ago. He is doing well from neurovascular standpoint without recurrent stroke or TIA symptoms. He states his memory difficulties are also unchanged though he does have some difficulty remembering names at times but he can remember them a little while later. He is independent in activities of daily living and managing his affairs quite well. He remains on Plavix for stroke prevention and does tend to bruise easily but has not had any bleeding episodes. He states his blood pressure is quite well controlled though it is slightly elevated at 149/68 in office today. He cannot remember the  last time he had lipid profile checked but he does have an upcoming visit with his primary physician next week when he is likely to have lab work checked. His fasting sugars he have been consistently ranging between 80-110 range. He has a new complaint today of emotional lability. He gets upset and starts crying easily while watching some sad news over the television or listening to people. At times he has also laughed a little inappropriately. He has been drooling a lot but he thinks this maybe related to his dentures and he plans to replace them soon. He denies feeling depressed. UPDATE 03/20/15. Tyler [name], 82 year old male returns for follow-up.  He is doing well from neurovascular standpoint without recurrent stroke or TIA symptoms. He feels his memory is unchanged. He is driving without difficulty. He performs activities of daily living and manages his affairs quite well. He is currently on Plavix with minimal bruising secondary stroke prevention. His blood pressure is well controlled and is 136/71 in office today. He claims his fasting sugars ranging anywhere from 90-110. He continues to have emotional lability, but it is much improved with Nuedexta which was started after last visit 6 months ago. He no longer gets upset,  easily cries etc,  He no longer  laughs inappropriately. He denies being depressed. He continues to walk for exercise Carotid Doppler done 02//14/2016 was negative for significant stenosis involving the extracranial carotid arteries. He returns for reevaluation  Update 09/24/2015 ; he returns for follow-up after last 6 months ago with Tyler [name]. Patient continues to do well from stroke standpoint without recurrent stroke or TIA symptoms. He states his blood pressure is usually quite good though it is slightly elevated at 155/62  in office today. He states his lipid profile last checked was fine. His fasting sugars have been good usually in the low 100 range. He plans to have repeat  lab work in a few months by his primary physician. States his short-term memory difficulties unchanged and are not progressive. He does have some occasional word finding difficulties. 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 He still complains of low back pain and plans to see his urologist as this may be related to a kidney stone. History of intra-axial right foot occasionally when walking and has some diminished fine motor skills in his right hand. He is tolerating Plavix well without bruising or bleeding. His last carotid ultrasound was in February 2016 and was fine.  REVIEW OF SYSTEMS: Full 14 system review of systems performed and notable only for those listed, all others are neg: Runny nose, and drooling, itching, cold intolerance, frequency of urination, frequent waking, back pain, walking difficulty, dizziness, easy bruising and all other systems negative    ALLERGIES: Allergies  Allergen Reactions  . Sulfa Drugs Cross Reactors Rash    HOME MEDICATIONS: Outpatient Prescriptions Prior to Visit  Medication Sig Dispense Refill  . amLODipine (NORVASC) 2.5 MG tablet 2.5 mg daily.    [name] atorvastatin (LIPITOR) 20 MG tablet Take 20 mg by mouth daily.    . cholecalciferol (VITAMIN D) 1000 UNITS tablet Take 1,000 Units by mouth daily.    . clopidogrel (PLAVIX) 75 MG tablet Take 1 tablet (75 mg total) by mouth daily with breakfast. 30 tablet 6  . cyanocobalamin 1000 MCG tablet Take 1,000 mcg by mouth daily.     . fish oil-omega-3 fatty acids 1000 MG capsule Take 2 g by mouth daily.     [name] glimepiride (AMARYL) 2 MG tablet Take 2 mg by mouth daily.    . Lancets (ONETOUCH ULTRASOFT) lancets     . losartan (COZAAR) 50 MG tablet Take 50 mg by mouth daily.    . metoprolol tartrate (LOPRESSOR) 12.5 mg TABS Take 0.5 tablets (12.5 mg total) by mouth 2 (two) times daily. 60 tablet 1  . ONE TOUCH ULTRA TEST test strip     . OVER THE COUNTER MEDICATION  Take by mouth daily. [place]    . pantoprazole (PROTONIX) 40 MG tablet Take 1 tablet (40 mg total) by mouth daily. 30 tablet 1  . Tamsulosin HCl (FLOMAX) 0.4 MG CAPS Take 1 tablet by mouth Daily.    . vitamin C (ASCORBIC ACID) 500 MG tablet Take 500 mg by mouth daily.    [name] Dextromethorphan-Quinidine 20-10 MG CAPS Take 1 capsule by mouth. Reported on 09/24/2015     No facility-administered medications prior to visit.    PAST MEDICAL HISTORY: Past Medical History  Diagnosis Date  . Hypertension   . Cancer ([place])   . Stroke ([place])   . Kidney stones   . Diabetes mellitus without complication ([place])     PAST SURGICAL HISTORY: Past Surgical History  Procedure Laterality Date  . Prostate surgery    . Cardiac catheterization      FAMILY HISTORY: Family History  Problem Relation Age of Onset  . Emphysema Father   . Heart Problems Mother     SOCIAL HISTORY: Social History   Social History  . Marital Status: Married    Spouse Name: Marland Mcalpine  . Number of Children: 2  . Years of Education: 12   Occupational History  .  retired     Orthoptist   Social History Main Topics  . Smoking status: Former Smoker    Quit date: 05/04/1968  . Smokeless tobacco: Never Used  . Alcohol Use: No  . Drug Use: No  . Sexual Activity: Not on file   Other Topics Concern  . Not on file   Social History Narrative   Patient lives at home with his wife [name]) retired. Patient has a high school education. Two Children . No caffeine. Right handed.     PHYSICAL EXAM  Filed Vitals:   09/24/15 1417  BP: 155/62  Pulse: 67  Height: 5\' 10"  (1.778 m)  Weight: 177 lb 3.2 oz (80.377 kg)   Body mass index is 25.43 kg/(m^2). Generalized: Well developed elderly Caucasian male, in no acute distress  Head: normocephalic and atraumatic,.   Neck: Supple, no carotid bruits  Cardiac: Regular rate rhythm, no murmur  Musculoskeletal: No deformity   Neurological examination   Mentation: Alert  oriented to time, place, history taking. MMSE  Not done( last visit ( 25/30 ) Follows all commands speech and language fluent  Cranial nerve II-XII: Pupils were equal round reactive to light extraocular movements were full, visual field were full on confrontational test. Facial sensation and strength were normal. hearing was intact to finger rubbing bilaterally. Uvula tongue midline. head turning and shoulder shrug were normal and symmetric.Tongue protrusion into cheek strength was normal. Motor: normal bulk and tone, full strength in the BUE, BLE, fine finger movements normal, no pronator drift. No focal weakness. Diminished fine finger movements on the right. Orbits left over right upper extremity. Coordination: finger-nose-finger, heel-to-shin bilaterally, no dysmetria Reflexes: Brachioradialis 2/2, biceps 2/2, triceps 2/2, patellar 2/2, Achilles absent plantar responses were flexor bilaterally. Gait and Station: Rising up from seated position without assistance, normal stance, moderate stride, good arm swing, Tandem gait is slightly unsteady. No assistive device     DIAGNOSTIC DATA (LABS, IMAGING, TESTING) -  ASSESSMENT AND PLAN 82 y.o. year old male has a past medical history of Hypertension; Stroke;  and Memory loss and pseudobulbar affect here to follow-up. r.   I had a long d/w patient about his remote stroke, risk for recurrent stroke/TIAs, personally independently reviewed imaging studies and stroke evaluation results and answered questions.Continue Plavix  for secondary stroke prevention and maintain strict control of hypertension with blood pressure goal below 130/90, diabetes with hemoglobin A1c goal below 6.5% and lipids with LDL cholesterol goal below 70 mg/dL. I also advised the patient to eat a healthy diet with plenty of whole grains, cereals, fruits and vegetables, exercise regularly and maintain ideal body weight , Check follow-up carotid ultrasound study. Greater than 50%  time during this 25 minute visit was spent on counseling and coordination of care about stroke risk and stroke prevention. Followup in the future with Cecille Rubin, nurse practitioner in one year or call earlier if necessary  Antony Contras, MD [facility] [address] [place], [place] [address] [phone]

## 2015-10-03 ENCOUNTER — Ambulatory Visit (INDEPENDENT_AMBULATORY_CARE_PROVIDER_SITE_OTHER): Payer: PPO

## 2015-10-03 DIAGNOSIS — I6529 Occlusion and stenosis of unspecified carotid artery: Secondary | ICD-10-CM | POA: Diagnosis not present

## 2015-10-14 ENCOUNTER — Telehealth: Payer: Self-pay

## 2015-10-14 NOTE — Telephone Encounter (Signed)
-----   Message from Garvin Fila, MD sent at 10/12/2015  2:44 PM EDT ----- Mitchell Heir inform patient that carotid dopplers showed no major blockages to worry about

## 2015-10-14 NOTE — Telephone Encounter (Signed)
Rn talk to patients wife Tyler [name] that her husbands carotid dopplers showed no major blockages. Rn stated per Dr.Sethi nothing to worry about. Pts wife verbalized understanding.

## 2015-10-17 DIAGNOSIS — C61 Malignant neoplasm of prostate: Secondary | ICD-10-CM | POA: Diagnosis not present

## 2015-10-17 DIAGNOSIS — R972 Elevated prostate specific antigen [PSA]: Secondary | ICD-10-CM | POA: Diagnosis not present

## 2015-10-24 DIAGNOSIS — N200 Calculus of kidney: Secondary | ICD-10-CM | POA: Diagnosis not present

## 2015-10-24 DIAGNOSIS — D49512 Neoplasm of unspecified behavior of left kidney: Secondary | ICD-10-CM | POA: Diagnosis not present

## 2015-10-24 DIAGNOSIS — R350 Frequency of micturition: Secondary | ICD-10-CM | POA: Diagnosis not present

## 2015-10-24 DIAGNOSIS — Z8546 Personal history of malignant neoplasm of prostate: Secondary | ICD-10-CM | POA: Diagnosis not present

## 2015-10-24 DIAGNOSIS — D49511 Neoplasm of unspecified behavior of right kidney: Secondary | ICD-10-CM | POA: Diagnosis not present

## 2015-11-14 DIAGNOSIS — Z85828 Personal history of other malignant neoplasm of skin: Secondary | ICD-10-CM | POA: Diagnosis not present

## 2015-11-14 DIAGNOSIS — L570 Actinic keratosis: Secondary | ICD-10-CM | POA: Diagnosis not present

## 2015-11-16 ENCOUNTER — Encounter (HOSPITAL_COMMUNITY): Payer: Self-pay

## 2015-11-16 ENCOUNTER — Inpatient Hospital Stay (HOSPITAL_COMMUNITY): Payer: PPO

## 2015-11-16 ENCOUNTER — Inpatient Hospital Stay (HOSPITAL_COMMUNITY)
Admission: EM | Admit: 2015-11-16 | Discharge: 2015-11-22 | DRG: 481 | Disposition: A | Discharge status: Skilled Nursing Facility | Payer: PPO | Attending: Internal Medicine | Admitting: Internal Medicine

## 2015-11-16 ENCOUNTER — Emergency Department (HOSPITAL_COMMUNITY): Payer: PPO

## 2015-11-16 DIAGNOSIS — Z87891 Personal history of nicotine dependence: Secondary | ICD-10-CM | POA: Diagnosis not present

## 2015-11-16 DIAGNOSIS — S72491D Other fracture of lower end of right femur, subsequent encounter for closed fracture with routine healing: Secondary | ICD-10-CM | POA: Diagnosis not present

## 2015-11-16 DIAGNOSIS — Z87442 Personal history of urinary calculi: Secondary | ICD-10-CM | POA: Diagnosis not present

## 2015-11-16 DIAGNOSIS — Z7902 Long term (current) use of antithrombotics/antiplatelets: Secondary | ICD-10-CM | POA: Diagnosis not present

## 2015-11-16 DIAGNOSIS — I129 Hypertensive chronic kidney disease with stage 1 through stage 4 chronic kidney disease, or unspecified chronic kidney disease: Secondary | ICD-10-CM | POA: Diagnosis present

## 2015-11-16 DIAGNOSIS — Z7984 Long term (current) use of oral hypoglycemic drugs: Secondary | ICD-10-CM

## 2015-11-16 DIAGNOSIS — D62 Acute posthemorrhagic anemia: Secondary | ICD-10-CM | POA: Diagnosis not present

## 2015-11-16 DIAGNOSIS — Z79899 Other long term (current) drug therapy: Secondary | ICD-10-CM

## 2015-11-16 DIAGNOSIS — K219 Gastro-esophageal reflux disease without esophagitis: Secondary | ICD-10-CM | POA: Diagnosis not present

## 2015-11-16 DIAGNOSIS — S7291XA Unspecified fracture of right femur, initial encounter for closed fracture: Secondary | ICD-10-CM

## 2015-11-16 DIAGNOSIS — N200 Calculus of kidney: Secondary | ICD-10-CM | POA: Diagnosis not present

## 2015-11-16 DIAGNOSIS — T148 Other injury of unspecified body region: Secondary | ICD-10-CM | POA: Diagnosis not present

## 2015-11-16 DIAGNOSIS — I69928 Other speech and language deficits following unspecified cerebrovascular disease: Secondary | ICD-10-CM | POA: Diagnosis not present

## 2015-11-16 DIAGNOSIS — S72491A Other fracture of lower end of right femur, initial encounter for closed fracture: Secondary | ICD-10-CM | POA: Diagnosis not present

## 2015-11-16 DIAGNOSIS — E1122 Type 2 diabetes mellitus with diabetic chronic kidney disease: Secondary | ICD-10-CM | POA: Diagnosis not present

## 2015-11-16 DIAGNOSIS — Z8546 Personal history of malignant neoplasm of prostate: Secondary | ICD-10-CM

## 2015-11-16 DIAGNOSIS — W109XXA Fall (on) (from) unspecified stairs and steps, initial encounter: Secondary | ICD-10-CM | POA: Diagnosis not present

## 2015-11-16 DIAGNOSIS — Z01818 Encounter for other preprocedural examination: Secondary | ICD-10-CM | POA: Diagnosis not present

## 2015-11-16 DIAGNOSIS — I10 Essential (primary) hypertension: Secondary | ICD-10-CM | POA: Diagnosis not present

## 2015-11-16 DIAGNOSIS — N183 Chronic kidney disease, stage 3 (moderate): Secondary | ICD-10-CM | POA: Diagnosis present

## 2015-11-16 DIAGNOSIS — K5900 Constipation, unspecified: Secondary | ICD-10-CM | POA: Diagnosis present

## 2015-11-16 DIAGNOSIS — M25551 Pain in right hip: Secondary | ICD-10-CM | POA: Diagnosis not present

## 2015-11-16 DIAGNOSIS — E785 Hyperlipidemia, unspecified: Secondary | ICD-10-CM | POA: Diagnosis not present

## 2015-11-16 DIAGNOSIS — M79651 Pain in right thigh: Secondary | ICD-10-CM | POA: Diagnosis not present

## 2015-11-16 DIAGNOSIS — S72142A Displaced intertrochanteric fracture of left femur, initial encounter for closed fracture: Secondary | ICD-10-CM | POA: Diagnosis not present

## 2015-11-16 DIAGNOSIS — T148XXA Other injury of unspecified body region, initial encounter: Secondary | ICD-10-CM

## 2015-11-16 DIAGNOSIS — S72141A Displaced intertrochanteric fracture of right femur, initial encounter for closed fracture: Principal | ICD-10-CM | POA: Diagnosis present

## 2015-11-16 DIAGNOSIS — E119 Type 2 diabetes mellitus without complications: Secondary | ICD-10-CM | POA: Diagnosis not present

## 2015-11-16 DIAGNOSIS — Z01811 Encounter for preprocedural respiratory examination: Secondary | ICD-10-CM

## 2015-11-16 LAB — CBC WITH DIFFERENTIAL/PLATELET
BASOS ABS: 0 10*3/uL (ref 0.0–0.1)
BASOS PCT: 0 %
EOS ABS: 0.1 10*3/uL (ref 0.0–0.7)
EOS PCT: 0 %
HCT: 36 % — ABNORMAL LOW (ref 39.0–52.0)
Hemoglobin: 12.4 g/dL — ABNORMAL LOW (ref 13.0–17.0)
LYMPHS PCT: 7 %
Lymphs Abs: 1 10*3/uL (ref 0.7–4.0)
MCH: 32 pg (ref 26.0–34.0)
MCHC: 34.4 g/dL (ref 30.0–36.0)
MCV: 93 fL (ref 78.0–100.0)
MONO ABS: 0.6 10*3/uL (ref 0.1–1.0)
Monocytes Relative: 5 %
Neutro Abs: 11.8 10*3/uL — ABNORMAL HIGH (ref 1.7–7.7)
Neutrophils Relative %: 88 %
PLATELETS: 143 10*3/uL — AB (ref 150–400)
RBC: 3.87 MIL/uL — AB (ref 4.22–5.81)
RDW: 13.1 % (ref 11.5–15.5)
WBC: 13.5 10*3/uL — AB (ref 4.0–10.5)

## 2015-11-16 LAB — URINALYSIS, ROUTINE W REFLEX MICROSCOPIC
BILIRUBIN URINE: NEGATIVE
GLUCOSE, UA: NEGATIVE mg/dL
KETONES UR: NEGATIVE mg/dL
Leukocytes, UA: NEGATIVE
NITRITE: NEGATIVE
PH: 5 (ref 5.0–8.0)
Protein, ur: NEGATIVE mg/dL
Specific Gravity, Urine: 1.02 (ref 1.005–1.030)

## 2015-11-16 LAB — BASIC METABOLIC PANEL
Anion gap: 8 (ref 5–15)
BUN: 23 mg/dL — AB (ref 6–20)
CALCIUM: 9 mg/dL (ref 8.9–10.3)
CO2: 19 mmol/L — ABNORMAL LOW (ref 22–32)
CREATININE: 1.67 mg/dL — AB (ref 0.61–1.24)
Chloride: 112 mmol/L — ABNORMAL HIGH (ref 101–111)
GFR calc Af Amer: 42 mL/min — ABNORMAL LOW (ref >60)
GFR, EST NON AFRICAN AMERICAN: 36 mL/min — AB (ref >60)
GLUCOSE: 140 mg/dL — AB (ref 65–99)
Potassium: 4.2 mmol/L (ref 3.5–5.1)
SODIUM: 139 mmol/L (ref 135–145)

## 2015-11-16 LAB — SURGICAL PCR SCREEN
MRSA, PCR: NEGATIVE
Staphylococcus aureus: NEGATIVE

## 2015-11-16 LAB — URINE MICROSCOPIC-ADD ON

## 2015-11-16 LAB — GLUCOSE, CAPILLARY: Glucose-Capillary: 170 mg/dL — ABNORMAL HIGH (ref 65–99)

## 2015-11-16 LAB — CBG MONITORING, ED: Glucose-Capillary: 137 mg/dL — ABNORMAL HIGH (ref 65–99)

## 2015-11-16 MED ORDER — HYDROCODONE-ACETAMINOPHEN 5-325 MG PO TABS
1.0000 | ORAL_TABLET | Freq: Four times a day (QID) | ORAL | Status: DC | PRN
  Administered 2015-11-16 – 2015-11-17 (×2): 2 via ORAL
  Administered 2015-11-18: 1 via ORAL
  Filled 2015-11-16: qty 2
  Filled 2015-11-16: qty 1
  Filled 2015-11-16: qty 2

## 2015-11-16 MED ORDER — ENOXAPARIN SODIUM 30 MG/0.3ML ~~LOC~~ SOLN
30.0000 mg | SUBCUTANEOUS | Status: DC
  Administered 2015-11-16: 30 mg via SUBCUTANEOUS
  Filled 2015-11-16: qty 0.3

## 2015-11-16 MED ORDER — MORPHINE SULFATE (PF) 2 MG/ML IV SOLN
0.5000 mg | INTRAVENOUS | Status: DC | PRN
  Administered 2015-11-16 – 2015-11-17 (×2): 0.5 mg via INTRAVENOUS
  Filled 2015-11-16 (×2): qty 1

## 2015-11-16 MED ORDER — MORPHINE SULFATE (PF) 2 MG/ML IV SOLN: 0.5000 mg | INTRAVENOUS | Status: DC | PRN

## 2015-11-16 MED ORDER — HYDROCODONE-ACETAMINOPHEN 5-325 MG PO TABS: 1.0000 | ORAL_TABLET | Freq: Four times a day (QID) | ORAL | Status: DC | PRN

## 2015-11-16 MED ORDER — POLYETHYLENE GLYCOL 3350 17 G PO PACK
17.0000 g | PACK | Freq: Every day | ORAL | Status: DC | PRN
  Administered 2015-11-19 – 2015-11-22 (×3): 17 g via ORAL
  Filled 2015-11-16 (×3): qty 1

## 2015-11-16 MED ORDER — INSULIN ASPART 100 UNIT/ML ~~LOC~~ SOLN
0.0000 [IU] | Freq: Three times a day (TID) | SUBCUTANEOUS | Status: DC
  Administered 2015-11-17: 3 [IU] via SUBCUTANEOUS
  Administered 2015-11-17: 2 [IU] via SUBCUTANEOUS
  Administered 2015-11-18: 1 [IU] via SUBCUTANEOUS
  Administered 2015-11-18: 3 [IU] via SUBCUTANEOUS
  Administered 2015-11-18 – 2015-11-19 (×2): 2 [IU] via SUBCUTANEOUS
  Administered 2015-11-19: 1 [IU] via SUBCUTANEOUS
  Administered 2015-11-19 – 2015-11-21 (×5): 2 [IU] via SUBCUTANEOUS
  Administered 2015-11-21: 3 [IU] via SUBCUTANEOUS
  Administered 2015-11-21: 2 [IU] via SUBCUTANEOUS
  Administered 2015-11-22: 3 [IU] via SUBCUTANEOUS

## 2015-11-16 MED ORDER — FENTANYL CITRATE (PF) 100 MCG/2ML IJ SOLN
50.0000 ug | Freq: Once | INTRAMUSCULAR | Status: AC
Start: 2015-11-16 — End: 2015-11-16
  Administered 2015-11-16: 50 ug via INTRAVENOUS

## 2015-11-16 MED ORDER — LOSARTAN POTASSIUM 50 MG PO TABS
50.0000 mg | ORAL_TABLET | Freq: Every day | ORAL | Status: DC
  Administered 2015-11-16 – 2015-11-21 (×6): 50 mg via ORAL
  Filled 2015-11-16 (×6): qty 1

## 2015-11-16 MED ORDER — PANTOPRAZOLE SODIUM 40 MG PO TBEC
40.0000 mg | DELAYED_RELEASE_TABLET | Freq: Every day | ORAL | Status: DC
  Administered 2015-11-18 – 2015-11-22 (×5): 40 mg via ORAL
  Filled 2015-11-16 (×6): qty 1

## 2015-11-16 MED ORDER — FENTANYL CITRATE (PF) 100 MCG/2ML IJ SOLN
50.0000 ug | Freq: Once | INTRAMUSCULAR | Status: AC
  Administered 2015-11-16: 50 ug via INTRAVENOUS
  Filled 2015-11-16: qty 2

## 2015-11-16 MED ORDER — HYDRALAZINE HCL 20 MG/ML IJ SOLN: 10.0000 mg | Freq: Four times a day (QID) | INTRAMUSCULAR | Status: DC | PRN

## 2015-11-16 MED ORDER — TAMSULOSIN HCL 0.4 MG PO CAPS
0.4000 mg | ORAL_CAPSULE | Freq: Every day | ORAL | Status: DC
  Filled 2015-11-16: qty 1

## 2015-11-16 MED ORDER — AMLODIPINE BESYLATE 2.5 MG PO TABS
2.5000 mg | ORAL_TABLET | Freq: Every day | ORAL | Status: DC
  Administered 2015-11-17 – 2015-11-22 (×6): 2.5 mg via ORAL
  Filled 2015-11-16 (×6): qty 1

## 2015-11-16 MED ORDER — ATORVASTATIN CALCIUM 20 MG PO TABS
20.0000 mg | ORAL_TABLET | Freq: Every day | ORAL | Status: DC
  Administered 2015-11-17 – 2015-11-21 (×5): 20 mg via ORAL
  Filled 2015-11-16 (×5): qty 1

## 2015-11-16 NOTE — H&P (Signed)
History and Physical    Tyler [name] [id number] DOB: 1928/10/16 DOA: 11/16/2015  PCP: Dwan Bolt, MD  Patient coming from:   Home    Chief Complaint: right femur fracture  HPI: CORAL KRAMMES is a 82 y.o. male with medical history significant for, but not limited to, DM 2, hypertension and history of CVA.  He has a couple of steps leading out from kitchen. Patient dropped a one of the grapes he was holding, attempted to lean over and pick it up but subsequently lost his balance and fell on right leg. Pain had immediate pain in right upper leg. The leg was immobile from the pain.  EMS brought patient to ED. Currently not in significant pain unless leg is moved. Patient did not strike his head during the fall  ED Course:  Afebrile, BP elevated earlier today, now 157/68. Intertrochanteric right proximal femur fracture Ortho consulted - plan is for surgery tomorrow  Review of Systems: As per HPI, otherwise 10 point review of systems negative.    Past Medical History  Diagnosis Date  . Hypertension   . Stroke ([place])   . Kidney stones   . Diabetes mellitus without complication ([place])   . Cancer [facility])     Prostate    Past Surgical History  Procedure Laterality Date  . Prostate surgery    . Cardiac catheterization      Social History   Social History  . Marital Status: Married    Spouse Name: Marland Mcalpine  . Number of Children: 2  . Years of Education: 12   Occupational History  . retired     Orthoptist   Social History Main Topics  . Smoking status: Former Smoker    Quit date: 05/04/1968  . Smokeless tobacco: Never Used  . Alcohol Use: No  . Drug Use: No  . Sexual Activity: Not on file   Other Topics Concern  . Not on file   Social History Narrative   Patient lives at home with his wife [name]) retired. Patient has a high school education. Two Children . No caffeine. Right handed.  Lives at home with wife. No assistive devices needed for  ambulation  Allergies  Allergen Reactions  . Sulfa Drugs Cross Reactors Anaphylaxis and Rash    Lungs fill with fluid    Family History  Problem Relation Age of Onset  . Emphysema Father   . Heart Problems Mother     Prior to Admission medications   Medication Sig Start Date End Date Taking? Authorizing Provider  amLODipine (NORVASC) 2.5 MG tablet Take 2.5 mg by mouth daily.  09/19/12  Yes Historical Provider, MD  atorvastatin (LIPITOR) 20 MG tablet Take 20 mg by mouth daily at 6 PM.    Yes Historical Provider, MD  cholecalciferol (VITAMIN D) 1000 UNITS tablet Take 1,000 Units by mouth every morning.    Yes Historical Provider, MD  clopidogrel (PLAVIX) 75 MG tablet Take 1 tablet (75 mg total) by mouth daily with breakfast. 03/20/15  Yes Dennie Bible, NP  cyanocobalamin 1000 MCG tablet Take 1,000 mcg by mouth daily.    Yes Historical Provider, MD  fish oil-omega-3 fatty acids 1000 MG capsule Take 1 g by mouth 2 (two) times daily.    Yes Historical Provider, MD  Flaxseed, Linseed, (FLAXSEED OIL) 1000 MG CAPS Take 1,000 mg by mouth at bedtime.   Yes Historical Provider, MD  glimepiride (AMARYL) 2 MG tablet Take 2 mg by mouth daily. 03/21/14  Yes  Historical Provider, MD  Lancets Glory Rosebush ULTRASOFT) lancets  07/07/12  Yes Historical Provider, MD  losartan (COZAAR) 50 MG tablet Take 50 mg by mouth at bedtime.    Yes Historical Provider, MD  ONE TOUCH ULTRA TEST test strip  09/06/12  Yes Historical Provider, MD  pantoprazole (PROTONIX) 40 MG tablet Take 1 tablet (40 mg total) by mouth daily. 02/12/12  Yes Daniel J Angiulli, PA-C  [place] Johns Wort 1000 MG CAPS Take 1,000 mg by mouth at bedtime.   Yes Historical Provider, MD  Tamsulosin HCl (FLOMAX) 0.4 MG CAPS Take 0.4 mg by mouth Daily.  02/29/12  Yes Historical Provider, MD  vitamin C (ASCORBIC ACID) 500 MG tablet Take 500 mg by mouth at bedtime.    Yes Historical Provider, MD  metoprolol tartrate (LOPRESSOR) 12.5 mg TABS Take 0.5 tablets  (12.5 mg total) by mouth 2 (two) times daily. Patient not taking: Reported on 11/16/2015 02/12/12   Cathlyn Parsons, PA-C    Physical Exam: Filed Vitals:   11/16/15 1135 11/16/15 1145 11/16/15 1345 11/16/15 1430  BP: 153/67 145/60 120/103 157/68  Pulse: 62 63 67 69  Temp: 98.5 F (36.9 C)     TempSrc: Oral     Resp: 16 15 19 12   Height: 5\' 10"  (1.778 m)     Weight: 78.019 kg (172 lb)     SpO2: 95% 95% 98% 98%    Constitutional:  NAD, calm, comfortable Filed Vitals:   11/16/15 1135 11/16/15 1145 11/16/15 1345 11/16/15 1430  BP: 153/67 145/60 120/103 157/68  Pulse: 62 63 67 69  Temp: 98.5 F (36.9 C)     TempSrc: Oral     Resp: 16 15 19 12   Height: 5\' 10"  (1.778 m)     Weight: 78.019 kg (172 lb)     SpO2: 95% 95% 98% 98%   Eyes: PER, lids and conjunctivae normal ENMT: Mucous membranes are moist. Posterior pharynx clear of any exudate or lesions..  Neck: normal, supple, no masses Respiratory: clear to auscultation bilaterally, no wheezing, no crackles. Normal respiratory effort. No accessory muscle use.  Cardiovascular: Regular rate and rhythm, No extremity edema. 2+ pedal pulses.   Abdomen: no tenderness, no masses palpated. No hepatomegaly. Bowel sounds positive.  Musculoskeletal: right leg is slightly abducted and slightly rotated externally. Can wiggle toes Good ROM in remaining extremities.  Normal muscle tone.  Skin: no rashes, lesions, ulcers.  Neurologic: CN 2-12 grossly intact. Sensation intact, Strength 5/5 in all 4.  Psychiatric: Normal judgment and insight. Alert and oriented x 3. Normal mood.    Labs on Admission: I have personally reviewed following labs and imaging studies  Radiological Exams on Admission: Dg Hip Unilat With Pelvis 2-3 Views Right  11/16/2015  CLINICAL DATA:  Right-sided hip pain after fall on concrete. EXAM: DG HIP (WITH OR WITHOUT PELVIS) 2-3V RIGHT COMPARISON:  None. FINDINGS: Frontal pelvis with AP and frog-leg lateral views of the  right hip show intertrochanteric right femoral neck fracture. Numerous surgical clips over the pelvis suggest prior lymph node dissection. Bones are demineralized. IMPRESSION: Intertrochanteric right proximal femur fracture. Electronically Signed   By: Misty Stanley M.D.   On: 11/16/2015 13:11    EKG: Will obtain  Assessment/Plan   Active Problems:   DM2 (diabetes mellitus, type 2) ([place])   Hyperlipidemia   Hypertension   Femur fracture, right ([place])   History of nephrolithiasis     Right femur fracture.            -  Admit to Medical bed -EDP has spoken with Orthopedics - surgery tomorrow -pain control -pre-op EKG, CXR     DM 2. On oral agent at home -Hold home oral diabetic agents -CBGs, SSI - sensitive  History of CVA with mild residual slurring of speech.  -Hold Plavix for upcoming surgery  CKD, stage 3. Cr 1.67, slightly above was it was in 2013.  -Minimize use of nephrotoxic medications  Hyperlipidemia.  -continue home statin  Hypertension. A few elevated readings in ED but overall all BP satisfactory -Continue home blood pressure medications  History of nephrolithiasis.  -Continue Flomax                                DVT prophylaxis:   Defer to Ortho. SCDs for now.  Code Status:   Full code  Family Communication:   Plan of treatment discussed with wife and two sons in room and they all agree with plan.  .  Disposition Plan: Discharge home in 2-3 days              Consults called:   Orthopedics - Dr. Lorin Mercy Admission status:  Admission- Medical bed   Tye Savoy NP [facility] Pager [phone]  If 7PM-7AM, please contact night-coverage [email or web address] Password [facility]  11/16/2015, 3:38 PM

## 2015-11-16 NOTE — ED Provider Notes (Signed)
CSN: [id number]     Arrival date & time 11/16/15  1117 History   First MD Initiated Contact with Patient 11/16/15 1132     Chief Complaint  Patient presents with  . Fall  . Groin Pain     (Consider location/radiation/quality/duration/timing/severity/associated sxs/prior Treatment) HPI Comments: Tyler [name] is a 82 y.o. male with history of HTN, CVA with residual deficits of right facial droop and right leg lag, and T2 DM presents to ED following a fall. Patient reports this morning he was coming down steps, dropped a grape, tried to catch it, and subsequently fell on the right side landing on right hip. He was unable to move or get up. Pain is sharp, constant, in right groin, worse with movement, improved with no movement. Pain was initially 10/10, EMS gave him 170mcg of fentanyl. Pain is currently 7/10. Patient is able to move toes. Denies any new neurologic symptoms. No head trauma. No loss of consciousness. He is currently on Plavix. No fevers. No chest pain. No shortness of breath.  Patient is a 82 y.o. male presenting with fall and groin pain. The history is provided by the patient, medical records and the spouse.  Fall Associated symptoms include arthralgias.  Groin Pain Associated symptoms include arthralgias.    Past Medical History  Diagnosis Date  . Hypertension   . Stroke ([place])   . Kidney stones   . Diabetes mellitus without complication ([place])   . Cancer [facility])     Prostate   Past Surgical History  Procedure Laterality Date  . Prostate surgery    . Cardiac catheterization     Family History  Problem Relation Age of Onset  . Emphysema Father   . Heart Problems Mother    Social History  Substance Use Topics  . Smoking status: Former Smoker    Quit date: 05/04/1968  . Smokeless tobacco: Never Used  . Alcohol Use: No    Review of Systems  Musculoskeletal: Positive for arthralgias and gait problem ( right leg lag secondary to previous CVA).  Neurological:  Positive for facial asymmetry ( chronic right sided facial droop secondary to previous CVA).  All other systems reviewed and are negative.     Allergies  Sulfa drugs cross reactors  Home Medications   Prior to Admission medications   Medication Sig Start Date End Date Taking? Authorizing Provider  amLODipine (NORVASC) 2.5 MG tablet 2.5 mg daily. 09/19/12   Historical Provider, MD  atorvastatin (LIPITOR) 20 MG tablet Take 20 mg by mouth daily.    Historical Provider, MD  cholecalciferol (VITAMIN D) 1000 UNITS tablet Take 1,000 Units by mouth daily.    Historical Provider, MD  clopidogrel (PLAVIX) 75 MG tablet Take 1 tablet (75 mg total) by mouth daily with breakfast. 03/20/15   Dennie Bible, NP  cyanocobalamin 1000 MCG tablet Take 1,000 mcg by mouth daily.     Historical Provider, MD  fish oil-omega-3 fatty acids 1000 MG capsule Take 2 g by mouth daily.     Historical Provider, MD  glimepiride (AMARYL) 2 MG tablet Take 2 mg by mouth daily. 03/21/14   Historical Provider, MD  Lancets Glory Rosebush ULTRASOFT) lancets  07/07/12   Historical Provider, MD  losartan (COZAAR) 50 MG tablet Take 50 mg by mouth daily.    Historical Provider, MD  metoprolol tartrate (LOPRESSOR) 12.5 mg TABS Take 0.5 tablets (12.5 mg total) by mouth 2 (two) times daily. 02/12/12   Lavon Paganini Angiulli, PA-C  ONE TOUCH ULTRA  TEST test strip  09/06/12   Historical Provider, MD  OVER THE COUNTER MEDICATION Take by mouth daily. [place] Provider, MD  pantoprazole (PROTONIX) 40 MG tablet Take 1 tablet (40 mg total) by mouth daily. 02/12/12   Lavon Paganini Angiulli, PA-C  Tamsulosin HCl (FLOMAX) 0.4 MG CAPS Take 1 tablet by mouth Daily. 02/29/12   Historical Provider, MD  vitamin C (ASCORBIC ACID) 500 MG tablet Take 500 mg by mouth daily.    Historical Provider, MD   BP 153/67 mmHg  Pulse 62  Temp(Src) 98.5 F (36.9 C) (Oral)  Resp 16  Ht 5\' 10"  (1.778 m)  Wt 78.019 kg  BMI 24.68 kg/m2  SpO2 95% Physical  Exam  Constitutional: He appears well-developed and well-nourished. No distress.  HENT:  Head: Normocephalic and atraumatic.  Mouth/Throat: Oropharynx is clear and moist. No oropharyngeal exudate.  Eyes: Conjunctivae and EOM are normal. Pupils are equal, round, and reactive to light. Right eye exhibits no discharge. Left eye exhibits no discharge. No scleral icterus.  Neck: Normal range of motion. Neck supple.  Cardiovascular: Normal rate, regular rhythm, normal heart sounds and intact distal pulses.   No murmur heard. Pulmonary/Chest: Effort normal and breath sounds normal. No respiratory distress.  Abdominal: Soft. Bowel sounds are normal. There is no tenderness. There is no rebound and no guarding.  Musculoskeletal:  Right lower extremity is externally rotated and shorter compared to left. Patient able to move toes. Sensation intact. 2+ distal pulses. Capillary refill <3 seconds.  Lymphadenopathy:    He has no cervical adenopathy.  Neurological: He is alert. Coordination normal.  Skin: Skin is warm and dry. He is not diaphoretic.  Psychiatric: He has a normal mood and affect. His behavior is normal.    ED Course  Procedures (including critical care time) Labs Review Labs Reviewed  BASIC METABOLIC PANEL  CBC WITH DIFFERENTIAL/PLATELET    Imaging Review Dg Hip Unilat With Pelvis 2-3 Views Right  11/16/2015  CLINICAL DATA:  Right-sided hip pain after fall on concrete. EXAM: DG HIP (WITH OR WITHOUT PELVIS) 2-3V RIGHT COMPARISON:  None. FINDINGS: Frontal pelvis with AP and frog-leg lateral views of the right hip show intertrochanteric right femoral neck fracture. Numerous surgical clips over the pelvis suggest prior lymph node dissection. Bones are demineralized. IMPRESSION: Intertrochanteric right proximal femur fracture. Electronically Signed   By: Misty Stanley M.D.   On: 11/16/2015 13:11   I have personally reviewed and evaluated these images and lab results as part of my medical  decision-making.   EKG Interpretation None     Filed Vitals:   11/16/15 1135  BP: 153/67  Pulse: 62  Temp: 98.5 F (36.9 C)  Resp: 16    MDM   Final diagnoses:  Intertrochanteric fracture of right hip, closed, initial encounter [facility])    Patient is afebrile and nontoxic-appearing. He is resting comfortably in bed. Blood pressure is elevated, vital signs otherwise stable. Physical exam remarkable for right lower extremity being externally rotated and shorter compared to left. Return for possible hip fracture. X-rays pending. Pt discussed and seen by Dr. Laverta Baltimore, agrees with plan.   X-ray positive for right proximal intertrochanteric fracture.  CBC remarkable for leukocytosis with left shift, mild anemia and platelets noted. Mild elevation in creatinine; however, relatively stable compared to previous values. Consult to orthopedics and hospitalists.  1:25 PM: Spoke with Dr. Lorin Mercy, appreciated his time and input. Recommend admission and will repair in am.   1:29  PM: Spoke with Tye Savoy, APP for [facility], appreciated her time. Agree to admit patient for further management of hip fracture.   Macario Golds [place], PA-C 11/16/15 1411  Margette Fast, MD 11/16/15 [phone]

## 2015-11-16 NOTE — Consult Note (Signed)
Reason for Consult:right IT hip fx, closed Referring Physician: Janne Napoleon MD  Tyler [name] is an 82 y.o. male.  HPI: 82yo active community ambulator fell with above injury. Does not use any walking aids. He is on Plavix, past smoker, DM.   Past Medical History  Diagnosis Date  . Hypertension   . Stroke ([place])   . Kidney stones   . Diabetes mellitus without complication ([place])   . Cancer [facility])     Prostate    Past Surgical History  Procedure Laterality Date  . Prostate surgery    . Cardiac catheterization      Family History  Problem Relation Age of Onset  . Emphysema Father   . Heart Problems Mother     Social History:  reports that he quit smoking about 47 years ago. He has never used smokeless tobacco. He reports that he does not drink alcohol or use illicit drugs.  Allergies:  Allergies  Allergen Reactions  . Sulfa Drugs Cross Reactors Anaphylaxis and Rash    Lungs fill with fluid    Medications: I have reviewed the patient's current medications.  Results for orders placed or performed during the hospital encounter of 11/16/15 (from the past 48 hour(s))  Basic metabolic panel     Status: Abnormal   Collection Time: 11/16/15  1:03 PM  Result Value Ref Range   Sodium 139 135 - 145 mmol/L   Potassium 4.2 3.5 - 5.1 mmol/L   Chloride 112 (H) 101 - 111 mmol/L   CO2 19 (L) 22 - 32 mmol/L   Glucose, Bld 140 (H) 65 - 99 mg/dL   BUN 23 (H) 6 - 20 mg/dL   Creatinine, Ser 1.67 (H) 0.61 - 1.24 mg/dL   Calcium 9.0 8.9 - 10.3 mg/dL   GFR calc non Af Amer 36 (L) >60 mL/min   GFR calc Af Amer 42 (L) >60 mL/min    Comment: (NOTE) The eGFR has been calculated using the CKD EPI equation. This calculation has not been validated in all clinical situations. eGFR's persistently <60 mL/min signify possible Chronic Kidney Disease.    Anion gap 8 5 - 15  CBC with Differential     Status: Abnormal   Collection Time: 11/16/15  1:03 PM  Result Value Ref Range   WBC 13.5 (H) 4.0 -  10.5 K/uL   RBC 3.87 (L) 4.22 - 5.81 MIL/uL   Hemoglobin 12.4 (L) 13.0 - 17.0 g/dL   HCT 36.0 (L) 39.0 - 52.0 %   MCV 93.0 78.0 - 100.0 fL   MCH 32.0 26.0 - 34.0 pg   MCHC 34.4 30.0 - 36.0 g/dL   RDW 13.1 11.5 - 15.5 %   Platelets 143 (L) 150 - 400 K/uL   Neutrophils Relative % 88 %   Neutro Abs 11.8 (H) 1.7 - 7.7 K/uL   Lymphocytes Relative 7 %   Lymphs Abs 1.0 0.7 - 4.0 K/uL   Monocytes Relative 5 %   Monocytes Absolute 0.6 0.1 - 1.0 K/uL   Eosinophils Relative 0 %   Eosinophils Absolute 0.1 0.0 - 0.7 K/uL   Basophils Relative 0 %   Basophils Absolute 0.0 0.0 - 0.1 K/uL  CBG monitoring, ED     Status: Abnormal   Collection Time: 11/16/15  4:43 PM  Result Value Ref Range   Glucose-Capillary 137 (H) 65 - 99 mg/dL    Chest Portable 1 View  11/16/2015  CLINICAL DATA:  Pre-op imaging for fractured hip surgery tomorrow. Hx  of HTN, diabetes, stroke, prostate cancer. EXAM: PORTABLE CHEST 1 VIEW COMPARISON:  01/29/2012 FINDINGS: The heart size and mediastinal contours are within normal limits. Both lungs are clear. The visualized skeletal structures are unremarkable. IMPRESSION: No active disease. Electronically Signed   By: Nolon Nations M.D.   On: 11/16/2015 17:13   Dg Hip Unilat With Pelvis 2-3 Views Right  11/16/2015  CLINICAL DATA:  Right-sided hip pain after fall on concrete. EXAM: DG HIP (WITH OR WITHOUT PELVIS) 2-3V RIGHT COMPARISON:  None. FINDINGS: Frontal pelvis with AP and frog-leg lateral views of the right hip show intertrochanteric right femoral neck fracture. Numerous surgical clips over the pelvis suggest prior lymph node dissection. Bones are demineralized. IMPRESSION: Intertrochanteric right proximal femur fracture. Electronically Signed   By: Misty Stanley M.D.   On: 11/16/2015 13:11    Review of Systems  Constitutional: Negative for fever and chills.  HENT: Negative for ear discharge.   Eyes: Negative for blurred vision.  Genitourinary: Negative for hematuria.   Endo/Heme/Allergies: Bruises/bleeds easily.       Diabetes  Psychiatric/Behavioral: Negative for depression and suicidal ideas.   Blood pressure 142/59, pulse 73, temperature 98.5 F (36.9 C), temperature source Oral, resp. rate 18, height _0  (1.778 m), weight 78.019 kg (172 lb), SpO2 96 %. Physical Exam  Constitutional: He is oriented to person, place, and time. He appears well-developed and well-nourished.  HENT:  Head: Normocephalic.  Eyes: Pupils are equal, round, and reactive to light.  Neck: Normal range of motion.  Cardiovascular: Normal rate.   Respiratory: Effort normal.  GI: Soft. There is no rebound.  Musculoskeletal: He exhibits no edema.  Pain with right hip ROM  Neurological: He is alert and oriented to person, place, and time.  Skin: Skin is warm and dry. No rash noted. No erythema.  Psychiatric: He has a normal mood and affect. His behavior is normal. Judgment and thought content normal.    Assessment/Plan: Right IT hip Fx for surgery Sunday 7:30 AM. Discussed with pt and family, they agree to proceed.  Wendy Hoback C 11/16/2015, 7:08 PM

## 2015-11-16 NOTE — ED Notes (Signed)
To room via EMS.  Pt was walking from kitchen down two steps into carport, dropped grapes, bent over to pick up and fell forward landing on right lateral hip/leg.  Unable to straighten leg.  Pain is in right groin/upper leg area.  EMS gave Fentanyl 150 mcg.  Pt has had previous stroke, deficits are: right facial droop, drags right leg, hand grips are equal.  Pt does not use a cane, walker or wheelchair, although reports he should.  Pain scale 7/10.  No LOC, head injury.

## 2015-11-16 NOTE — ED Notes (Signed)
Dinner tray ordered, diabetic diet

## 2015-11-16 NOTE — ED Notes (Signed)
CBG 137

## 2015-11-16 NOTE — Anesthesia Preprocedure Evaluation (Addendum)
Anesthesia Evaluation  Patient identified by MRN, date of birth, ID band Patient awake    Reviewed: Allergy & Precautions, NPO status , Patient's Chart, lab work & pertinent test results  Airway Mallampati: II  TM Distance: <3 FB Neck ROM: Full    Dental  (+) Dental Advisory Given, Edentulous Upper, Edentulous Lower   Pulmonary former smoker,    Pulmonary exam normal breath sounds clear to auscultation       Cardiovascular hypertension, Pt. on medications + Peripheral Vascular Disease  Normal cardiovascular exam Rhythm:Regular Rate:Normal  TTE 01/29/12: Study Conclusions  Left ventricle: The cavity size was normal. Wall thickness was increased in a pattern of mild LVH. Systolic function was vigorous. The estimated ejection fraction was in the range of 65% to 70%. Doppler parameters are consistent with abnormal left ventricular relaxation (grade 1 diastolic dysfunction).   Neuro/Psych CVA (right sided weakness), Residual Symptoms negative psych ROS   GI/Hepatic Neg liver ROS, GERD  Medicated and Controlled,  Endo/Other  diabetes  Renal/GU Renal InsufficiencyRenal disease     Musculoskeletal Right hip fracture   Abdominal   Peds  Hematology  (+) Blood dyscrasia, anemia , Plt 143k   Anesthesia Other Findings Day of surgery medications reviewed with the patient.  Reproductive/Obstetrics                         Anesthesia Physical Anesthesia Plan  ASA: III  Anesthesia Plan: General   Post-op Pain Management:    Induction: Intravenous  Airway Management Planned: Oral ETT  Additional Equipment:   Intra-op Plan:   Post-operative Plan: Extubation in OR  Informed Consent: I have reviewed the patients History and Physical, chart, labs and discussed the procedure including the risks, benefits and alternatives for the proposed anesthesia with the patient or authorized representative who has  indicated his/her understanding and acceptance.   Dental advisory given  Plan Discussed with: CRNA  Anesthesia Plan Comments: (Risks/benefits of general anesthesia discussed with patient including risk of damage to teeth, lips, gum, and tongue, nausea/vomiting, allergic reactions to medications, and the possibility of heart attack, stroke and death.  All patient questions answered.  Patient wishes to proceed.)        Anesthesia Quick Evaluation

## 2015-11-17 ENCOUNTER — Inpatient Hospital Stay (HOSPITAL_COMMUNITY): Payer: PPO | Admitting: Anesthesiology

## 2015-11-17 ENCOUNTER — Inpatient Hospital Stay (HOSPITAL_COMMUNITY): Payer: PPO

## 2015-11-17 ENCOUNTER — Encounter (HOSPITAL_COMMUNITY)
Admission: EM | Disposition: A | Discharge status: Skilled Nursing Facility | Payer: Self-pay | Source: Home / Self Care | Attending: Internal Medicine

## 2015-11-17 DIAGNOSIS — E785 Hyperlipidemia, unspecified: Secondary | ICD-10-CM | POA: Diagnosis not present

## 2015-11-17 DIAGNOSIS — S72091A Other fracture of head and neck of right femur, initial encounter for closed fracture: Secondary | ICD-10-CM | POA: Diagnosis not present

## 2015-11-17 DIAGNOSIS — S72141A Displaced intertrochanteric fracture of right femur, initial encounter for closed fracture: Secondary | ICD-10-CM | POA: Diagnosis not present

## 2015-11-17 DIAGNOSIS — I10 Essential (primary) hypertension: Secondary | ICD-10-CM | POA: Diagnosis not present

## 2015-11-17 DIAGNOSIS — Z7902 Long term (current) use of antithrombotics/antiplatelets: Secondary | ICD-10-CM | POA: Diagnosis not present

## 2015-11-17 DIAGNOSIS — S72491A Other fracture of lower end of right femur, initial encounter for closed fracture: Secondary | ICD-10-CM | POA: Diagnosis not present

## 2015-11-17 DIAGNOSIS — E1122 Type 2 diabetes mellitus with diabetic chronic kidney disease: Secondary | ICD-10-CM | POA: Diagnosis not present

## 2015-11-17 DIAGNOSIS — K5900 Constipation, unspecified: Secondary | ICD-10-CM | POA: Diagnosis not present

## 2015-11-17 DIAGNOSIS — Z87891 Personal history of nicotine dependence: Secondary | ICD-10-CM | POA: Diagnosis not present

## 2015-11-17 DIAGNOSIS — N183 Chronic kidney disease, stage 3 (moderate): Secondary | ICD-10-CM | POA: Diagnosis not present

## 2015-11-17 DIAGNOSIS — I129 Hypertensive chronic kidney disease with stage 1 through stage 4 chronic kidney disease, or unspecified chronic kidney disease: Secondary | ICD-10-CM | POA: Diagnosis not present

## 2015-11-17 DIAGNOSIS — Z8546 Personal history of malignant neoplasm of prostate: Secondary | ICD-10-CM | POA: Diagnosis not present

## 2015-11-17 DIAGNOSIS — D62 Acute posthemorrhagic anemia: Secondary | ICD-10-CM | POA: Diagnosis not present

## 2015-11-17 DIAGNOSIS — Z79899 Other long term (current) drug therapy: Secondary | ICD-10-CM | POA: Diagnosis not present

## 2015-11-17 DIAGNOSIS — N200 Calculus of kidney: Secondary | ICD-10-CM | POA: Diagnosis not present

## 2015-11-17 HISTORY — PX: FEMUR IM NAIL: SHX1597

## 2015-11-17 LAB — CBC
HCT: 32.2 % — ABNORMAL LOW (ref 39.0–52.0)
Hemoglobin: 10.8 g/dL — ABNORMAL LOW (ref 13.0–17.0)
MCH: 31.1 pg (ref 26.0–34.0)
MCHC: 33.5 g/dL (ref 30.0–36.0)
MCV: 92.8 fL (ref 78.0–100.0)
PLATELETS: 140 10*3/uL — AB (ref 150–400)
RBC: 3.47 MIL/uL — AB (ref 4.22–5.81)
RDW: 13.2 % (ref 11.5–15.5)
WBC: 9 10*3/uL (ref 4.0–10.5)

## 2015-11-17 LAB — BASIC METABOLIC PANEL
ANION GAP: 5 (ref 5–15)
BUN: 25 mg/dL — ABNORMAL HIGH (ref 6–20)
CALCIUM: 9.1 mg/dL (ref 8.9–10.3)
CO2: 23 mmol/L (ref 22–32)
Chloride: 109 mmol/L (ref 101–111)
Creatinine, Ser: 1.51 mg/dL — ABNORMAL HIGH (ref 0.61–1.24)
GFR, EST AFRICAN AMERICAN: 47 mL/min — AB (ref >60)
GFR, EST NON AFRICAN AMERICAN: 41 mL/min — AB (ref >60)
GLUCOSE: 126 mg/dL — AB (ref 65–99)
POTASSIUM: 3.9 mmol/L (ref 3.5–5.1)
Sodium: 137 mmol/L (ref 135–145)

## 2015-11-17 LAB — GLUCOSE, CAPILLARY
GLUCOSE-CAPILLARY: 141 mg/dL — AB (ref 65–99)
GLUCOSE-CAPILLARY: 152 mg/dL — AB (ref 65–99)
GLUCOSE-CAPILLARY: 212 mg/dL — AB (ref 65–99)
Glucose-Capillary: 109 mg/dL — ABNORMAL HIGH (ref 65–99)
Glucose-Capillary: 153 mg/dL — ABNORMAL HIGH (ref 65–99)

## 2015-11-17 SURGERY — INSERTION, INTRAMEDULLARY ROD, FEMUR
Anesthesia: General | Site: Hip | Laterality: Right

## 2015-11-17 MED ORDER — POLYETHYLENE GLYCOL 3350 17 G PO PACK: 17.0000 g | PACK | Freq: Every day | ORAL | Status: DC | PRN

## 2015-11-17 MED ORDER — LIDOCAINE 2% (20 MG/ML) 5 ML SYRINGE
INTRAMUSCULAR | Status: AC
  Filled 2015-11-17: qty 5

## 2015-11-17 MED ORDER — ROCURONIUM BROMIDE 100 MG/10ML IV SOLN
INTRAVENOUS | Status: DC | PRN
  Administered 2015-11-17: 50 mg via INTRAVENOUS

## 2015-11-17 MED ORDER — METOCLOPRAMIDE HCL 5 MG PO TABS: 5.0000 mg | ORAL_TABLET | Freq: Three times a day (TID) | ORAL | Status: DC | PRN

## 2015-11-17 MED ORDER — SUGAMMADEX SODIUM 200 MG/2ML IV SOLN
INTRAVENOUS | Status: DC | PRN
  Administered 2015-11-17: 160 mg via INTRAVENOUS

## 2015-11-17 MED ORDER — ACETAMINOPHEN 325 MG PO TABS
650.0000 mg | ORAL_TABLET | Freq: Four times a day (QID) | ORAL | Status: DC | PRN
  Administered 2015-11-17 – 2015-11-22 (×6): 650 mg via ORAL
  Filled 2015-11-17 (×6): qty 2

## 2015-11-17 MED ORDER — OXYCODONE HCL 5 MG PO TABS
5.0000 mg | ORAL_TABLET | ORAL | Status: DC | PRN
  Administered 2015-11-18: 5 mg via ORAL
  Filled 2015-11-17: qty 1

## 2015-11-17 MED ORDER — HYDROCODONE-ACETAMINOPHEN 5-325 MG PO TABS: 1.0000 | ORAL_TABLET | Freq: Four times a day (QID) | ORAL | Status: DC | PRN

## 2015-11-17 MED ORDER — ONDANSETRON HCL 4 MG/2ML IJ SOLN: 4.0000 mg | Freq: Once | INTRAMUSCULAR | Status: DC | PRN

## 2015-11-17 MED ORDER — SODIUM CHLORIDE 0.45 % IV SOLN
INTRAVENOUS | Status: DC
  Administered 2015-11-17: 11:00:00 via INTRAVENOUS

## 2015-11-17 MED ORDER — BUPIVACAINE HCL (PF) 0.5 % IJ SOLN
INTRAMUSCULAR | Status: DC | PRN
  Administered 2015-11-17: 30 mL

## 2015-11-17 MED ORDER — PHENYLEPHRINE HCL 10 MG/ML IJ SOLN
INTRAMUSCULAR | Status: DC | PRN
  Administered 2015-11-17: 80 ug via INTRAVENOUS

## 2015-11-17 MED ORDER — SUGAMMADEX SODIUM 200 MG/2ML IV SOLN
INTRAVENOUS | Status: AC
  Filled 2015-11-17: qty 2

## 2015-11-17 MED ORDER — CLOPIDOGREL BISULFATE 75 MG PO TABS
75.0000 mg | ORAL_TABLET | Freq: Every day | ORAL | Status: DC
  Administered 2015-11-18 – 2015-11-22 (×5): 75 mg via ORAL
  Filled 2015-11-17 (×5): qty 1

## 2015-11-17 MED ORDER — FENTANYL CITRATE (PF) 250 MCG/5ML IJ SOLN
INTRAMUSCULAR | Status: DC | PRN
  Administered 2015-11-17 (×2): 100 ug via INTRAVENOUS

## 2015-11-17 MED ORDER — ACETAMINOPHEN 650 MG RE SUPP: 650.0000 mg | Freq: Four times a day (QID) | RECTAL | Status: DC | PRN

## 2015-11-17 MED ORDER — PHENOL 1.4 % MT LIQD
1.0000 | OROMUCOSAL | Status: DC | PRN
Start: 2015-11-17 — End: 2015-11-22

## 2015-11-17 MED ORDER — SODIUM CHLORIDE 0.9 % IV SOLN
INTRAVENOUS | Status: DC | PRN
  Administered 2015-11-17: 09:00:00 via INTRAVENOUS

## 2015-11-17 MED ORDER — ONDANSETRON HCL 4 MG/2ML IJ SOLN
INTRAMUSCULAR | Status: DC | PRN
  Administered 2015-11-17: 4 mg via INTRAVENOUS

## 2015-11-17 MED ORDER — ENOXAPARIN SODIUM 40 MG/0.4ML ~~LOC~~ SOLN
40.0000 mg | Freq: Every day | SUBCUTANEOUS | Status: DC
  Administered 2015-11-17 – 2015-11-21 (×5): 40 mg via SUBCUTANEOUS
  Filled 2015-11-17 (×5): qty 0.4

## 2015-11-17 MED ORDER — FENTANYL CITRATE (PF) 100 MCG/2ML IJ SOLN
INTRAMUSCULAR | Status: AC
  Administered 2015-11-17: 25 ug via INTRAVENOUS
  Filled 2015-11-17: qty 2

## 2015-11-17 MED ORDER — PROPOFOL 10 MG/ML IV BOLUS
INTRAVENOUS | Status: DC | PRN
  Administered 2015-11-17: 100 mg via INTRAVENOUS

## 2015-11-17 MED ORDER — LIDOCAINE 2% (20 MG/ML) 5 ML SYRINGE
INTRAMUSCULAR | Status: DC | PRN
  Administered 2015-11-17: 100 mg via INTRAVENOUS

## 2015-11-17 MED ORDER — ENOXAPARIN SODIUM 40 MG/0.4ML ~~LOC~~ SOLN: 40.0000 mg | SUBCUTANEOUS | Status: DC

## 2015-11-17 MED ORDER — ONDANSETRON HCL 4 MG/2ML IJ SOLN
INTRAMUSCULAR | Status: AC
  Filled 2015-11-17: qty 2

## 2015-11-17 MED ORDER — ONDANSETRON HCL 4 MG/2ML IJ SOLN: 4.0000 mg | Freq: Four times a day (QID) | INTRAMUSCULAR | Status: DC | PRN

## 2015-11-17 MED ORDER — LACTATED RINGERS IV SOLN
INTRAVENOUS | Status: DC | PRN
  Administered 2015-11-17: 07:00:00 via INTRAVENOUS

## 2015-11-17 MED ORDER — PROPOFOL 10 MG/ML IV BOLUS
INTRAVENOUS | Status: AC
  Filled 2015-11-17: qty 20

## 2015-11-17 MED ORDER — MORPHINE SULFATE (PF) 2 MG/ML IV SOLN: 0.5000 mg | INTRAVENOUS | Status: DC | PRN

## 2015-11-17 MED ORDER — METOCLOPRAMIDE HCL 5 MG/ML IJ SOLN: 5.0000 mg | Freq: Three times a day (TID) | INTRAMUSCULAR | Status: DC | PRN

## 2015-11-17 MED ORDER — FENTANYL CITRATE (PF) 250 MCG/5ML IJ SOLN
INTRAMUSCULAR | Status: AC
  Filled 2015-11-17: qty 5

## 2015-11-17 MED ORDER — PHENYLEPHRINE HCL 10 MG/ML IJ SOLN
10.0000 mg | INTRAVENOUS | Status: DC | PRN
  Administered 2015-11-17: 60 ug/min via INTRAVENOUS

## 2015-11-17 MED ORDER — MENTHOL 3 MG MT LOZG: 1.0000 | LOZENGE | OROMUCOSAL | Status: DC | PRN

## 2015-11-17 MED ORDER — PHENYLEPHRINE 40 MCG/ML (10ML) SYRINGE FOR IV PUSH (FOR BLOOD PRESSURE SUPPORT)
PREFILLED_SYRINGE | INTRAVENOUS | Status: AC
  Filled 2015-11-17: qty 10

## 2015-11-17 MED ORDER — FENTANYL CITRATE (PF) 100 MCG/2ML IJ SOLN
25.0000 ug | INTRAMUSCULAR | Status: DC | PRN
  Administered 2015-11-17 (×4): 25 ug via INTRAVENOUS

## 2015-11-17 MED ORDER — BUPIVACAINE-EPINEPHRINE (PF) 0.5% -1:200000 IJ SOLN
INTRAMUSCULAR | Status: AC
  Filled 2015-11-17: qty 30

## 2015-11-17 MED ORDER — DOCUSATE SODIUM 100 MG PO CAPS
100.0000 mg | ORAL_CAPSULE | Freq: Two times a day (BID) | ORAL | Status: DC
  Administered 2015-11-17 – 2015-11-22 (×11): 100 mg via ORAL
  Filled 2015-11-17 (×12): qty 1

## 2015-11-17 MED ORDER — BUPIVACAINE HCL (PF) 0.5 % IJ SOLN
INTRAMUSCULAR | Status: AC
  Filled 2015-11-17: qty 30

## 2015-11-17 MED ORDER — BISACODYL 10 MG RE SUPP: 10.0000 mg | Freq: Every day | RECTAL | Status: DC | PRN

## 2015-11-17 MED ORDER — ONDANSETRON HCL 4 MG PO TABS: 4.0000 mg | ORAL_TABLET | Freq: Four times a day (QID) | ORAL | Status: DC | PRN

## 2015-11-17 MED ORDER — CEFAZOLIN SODIUM-DEXTROSE 2-4 GM/100ML-% IV SOLN
INTRAVENOUS | Status: AC
  Administered 2015-11-17: 2 g via INTRAVENOUS
  Filled 2015-11-17: qty 100

## 2015-11-17 MED ORDER — ROCURONIUM BROMIDE 50 MG/5ML IV SOLN
INTRAVENOUS | Status: AC
  Filled 2015-11-17: qty 1

## 2015-11-17 SURGICAL SUPPLY — 50 items
BIT DRILL 4.3MMS DISTAL GRDTED (BIT) ×1 IMPLANT
BLADE SURG 15 STRL LF DISP TIS (BLADE) ×1 IMPLANT
BLADE SURG 15 STRL SS (BLADE) ×3
BNDG COHESIVE 4X5 TAN STRL (GAUZE/BANDAGES/DRESSINGS) ×3 IMPLANT
CORTICAL BONE SCR 5.0MM X 48MM (Screw) ×3 IMPLANT
COVER MAYO STAND STRL (DRAPES) ×3 IMPLANT
COVER PERINEAL POST (MISCELLANEOUS) ×3 IMPLANT
COVER SURGICAL LIGHT HANDLE (MISCELLANEOUS) ×3 IMPLANT
COVER TABLE BACK 60X90 (DRAPES) ×3 IMPLANT
DRAPE C-ARM 42X72 X-RAY (DRAPES) ×3 IMPLANT
DRAPE STERI IOBAN 125X83 (DRAPES) ×3 IMPLANT
DRILL 4.3MMS DISTAL GRADUATED (BIT) ×3
DRSG ADAPTIC 3X8 NADH LF (GAUZE/BANDAGES/DRESSINGS) ×3 IMPLANT
DRSG PAD ABDOMINAL 8X10 ST (GAUZE/BANDAGES/DRESSINGS) ×3 IMPLANT
DURAPREP 26ML APPLICATOR (WOUND CARE) ×3 IMPLANT
ELECT REM PT RETURN 9FT ADLT (ELECTROSURGICAL) ×3
ELECTRODE REM PT RTRN 9FT ADLT (ELECTROSURGICAL) ×1 IMPLANT
EVACUATOR 1/8 PVC DRAIN (DRAIN) IMPLANT
GAUZE SPONGE 4X4 12PLY STRL (GAUZE/BANDAGES/DRESSINGS) ×3 IMPLANT
GLOVE BIOGEL PI IND STRL 8 (GLOVE) ×2 IMPLANT
GLOVE BIOGEL PI INDICATOR 8 (GLOVE) ×4
GLOVE ORTHO TXT STRL SZ7.5 (GLOVE) ×6 IMPLANT
GOWN STRL REUS W/ TWL LRG LVL3 (GOWN DISPOSABLE) ×1 IMPLANT
GOWN STRL REUS W/ TWL XL LVL3 (GOWN DISPOSABLE) ×1 IMPLANT
GOWN STRL REUS W/TWL 2XL LVL3 (GOWN DISPOSABLE) ×3 IMPLANT
GOWN STRL REUS W/TWL LRG LVL3 (GOWN DISPOSABLE) ×3
GOWN STRL REUS W/TWL XL LVL3 (GOWN DISPOSABLE) ×3
GUIDEPIN 3.2X17.5 THRD DISP (PIN) ×3 IMPLANT
GUIDEWIRE BALL NOSE 100CM (WIRE) ×3 IMPLANT
HFN LAG SCREW 10.5MM X 115MM (Orthopedic Implant) ×3 IMPLANT
KIT BASIN OR (CUSTOM PROCEDURE TRAY) ×3 IMPLANT
KIT ROOM TURNOVER OR (KITS) ×3 IMPLANT
LINER BOOT UNIVERSAL DISP (MISCELLANEOUS) ×3 IMPLANT
MANIFOLD NEPTUNE II (INSTRUMENTS) ×3 IMPLANT
NAIL HIP FRAC RT 130 11MX400M (Nail) ×3 IMPLANT
NS IRRIG 1000ML POUR BTL (IV SOLUTION) ×3 IMPLANT
PACK GENERAL/GYN (CUSTOM PROCEDURE TRAY) ×3 IMPLANT
PAD ARMBOARD 7.5X6 YLW CONV (MISCELLANEOUS) ×6 IMPLANT
SCREW CORTICL BON 5.0MM X 48MM (Screw) ×1 IMPLANT
SCREWDRIVER HEX TIP 3.5MM (MISCELLANEOUS) ×3 IMPLANT
SPONGE GAUZE 4X4 12PLY STER LF (GAUZE/BANDAGES/DRESSINGS) ×3 IMPLANT
STAPLER VISISTAT 35W (STAPLE) IMPLANT
SUT VIC AB 0 CT1 27 (SUTURE) ×3
SUT VIC AB 0 CT1 27XBRD ANBCTR (SUTURE) ×1 IMPLANT
SUT VIC AB 1 CT1 27 (SUTURE) ×3
SUT VIC AB 1 CT1 27XBRD ANBCTR (SUTURE) ×1 IMPLANT
SUT VIC AB 2-0 CT1 27 (SUTURE) ×6
SUT VIC AB 2-0 CT1 TAPERPNT 27 (SUTURE) ×2 IMPLANT
SYR CONTROL 10ML LL (SYRINGE) ×3 IMPLANT
WATER STERILE IRR 1000ML POUR (IV SOLUTION) ×6 IMPLANT

## 2015-11-17 NOTE — Op Note (Signed)
NAME:  Tyler [name], Tyler [name] NO.:  [id number]  MEDICAL RECORD NO.:  [id number]  LOCATION:  [facility]                         FACILITY:  [facility]  PHYSICIAN:  Senai Ramnath C. Lorin Mercy, M.D.    DATE OF BIRTH:  Mar 05, 1929  DATE OF PROCEDURE:  11/16/2015 DATE OF DISCHARGE:                              OPERATIVE REPORT   PREOPERATIVE DIAGNOSIS:  Right intertrochanteric hip fracture.  POSTOPERATIVE DIAGNOSIS:  Right intertrochanteric hip fracture.  PROCEDURE:  Right AFFIXUS Biomet trochanteric nail for right intertrochanteric fracture.  SURGEON:  Pratyush Ammon C. Lorin Mercy, M.D.  ANESTHESIA:  General plus Marcaine local.  ESTIMATED BLOOD LOSS:  200 mL.  IMPLANTS:  Biomet 400 x 11 with 115 lag screw and 48 mm distal interlock screw.  DRAINS:  None.  DESCRIPTION OF PROCEDURE:  After induction of general anesthesia, the patient was placed on the HANA table with the well leg holder on the left leg.  Extra-large boot was placed on the right leg, it was placed in internal rotation, brought out to length.  The patient had a fracture, but did not have significant shortening and only mild angulation with traction.  This was in near anatomic position.  Prepping with DuraPrep, squared with towels.  Large shower curtain, Steri-Drape applied.  Time-out procedure completed.  Ancef given prophylactically. Incision made proximal to trochanter.  Fascia was split, tip was palpated, pin was placed, checked under C-arm, drilled, reamed, and then the long beaded tip rod was passed down to the knee, checked under fluoro down the knee, 400 mm length was selected.  Nail was inserted without reaming.  Position checked under C-arm until it was down and then lag screw was placed after pin was placed center on AP and slightly posterior on lateral.  Measured 115 nail inserted, this gave contact just laterally through the lateral cortex with good securement, it locked screw down, side arm was removed.  Final AP and lateral  pictures were taken proximally and then using a freehand technique making round circles, the distal interlock screw was inserted, which was 48 based on depth gauge measurements.  Final spot pictures were taken.  Irrigation and closure of deep fascia with #1 Vicryl, 2-0 on the subcutaneous tissue, skin staple closure, Marcaine infiltration, and then transferred after extubation to the recovery room in stable condition.  Instrument count and needle count was correct.     Krist Rosenboom C. Lorin Mercy, M.D.     MCY/MEDQ  D:  11/17/2015  T:  11/17/2015  Job:  [id number]

## 2015-11-17 NOTE — Anesthesia Postprocedure Evaluation (Signed)
Anesthesia Post Note  Patient: KEYLON WHARFF  Procedure(s) Performed: Procedure(s) (LRB): INTRAMEDULLARY (IM) NAIL FEMORAL (Right)  Patient location during evaluation: PACU Anesthesia Type: General Level of consciousness: awake and alert Pain management: pain level controlled Vital Signs Assessment: post-procedure vital signs reviewed and stable Respiratory status: spontaneous breathing, nonlabored ventilation, respiratory function stable and patient connected to nasal cannula oxygen Cardiovascular status: blood pressure returned to baseline and stable Postop Assessment: no signs of nausea or vomiting Anesthetic complications: no    Last Vitals:  Filed Vitals:   11/17/15 1011 11/17/15 1200  BP:  147/55  Pulse: 73 64  Temp: 37.1 C 36.6 C  Resp: 13     Last Pain:  Filed Vitals:   11/17/15 1313  PainSc: 6                  Catalina Gravel

## 2015-11-17 NOTE — H&P (View-Only) (Signed)
Reason for Consult:right IT hip fx, closed Referring Physician: Janne Napoleon MD  Tyler [name] is an 82 y.o. male.  HPI: 82yo active community ambulator fell with above injury. Does not use any walking aids. He is on Plavix, past smoker, DM.   Past Medical History  Diagnosis Date  . Hypertension   . Stroke ([place])   . Kidney stones   . Diabetes mellitus without complication ([place])   . Cancer [facility])     Prostate    Past Surgical History  Procedure Laterality Date  . Prostate surgery    . Cardiac catheterization      Family History  Problem Relation Age of Onset  . Emphysema Father   . Heart Problems Mother     Social History:  reports that he quit smoking about 47 years ago. He has never used smokeless tobacco. He reports that he does not drink alcohol or use illicit drugs.  Allergies:  Allergies  Allergen Reactions  . Sulfa Drugs Cross Reactors Anaphylaxis and Rash    Lungs fill with fluid    Medications: I have reviewed the patient's current medications.  Results for orders placed or performed during the hospital encounter of 11/16/15 (from the past 48 hour(s))  Basic metabolic panel     Status: Abnormal   Collection Time: 11/16/15  1:03 PM  Result Value Ref Range   Sodium 139 135 - 145 mmol/L   Potassium 4.2 3.5 - 5.1 mmol/L   Chloride 112 (H) 101 - 111 mmol/L   CO2 19 (L) 22 - 32 mmol/L   Glucose, Bld 140 (H) 65 - 99 mg/dL   BUN 23 (H) 6 - 20 mg/dL   Creatinine, Ser 1.67 (H) 0.61 - 1.24 mg/dL   Calcium 9.0 8.9 - 10.3 mg/dL   GFR calc non Af Amer 36 (L) >60 mL/min   GFR calc Af Amer 42 (L) >60 mL/min    Comment: (NOTE) The eGFR has been calculated using the CKD EPI equation. This calculation has not been validated in all clinical situations. eGFR's persistently <60 mL/min signify possible Chronic Kidney Disease.    Anion gap 8 5 - 15  CBC with Differential     Status: Abnormal   Collection Time: 11/16/15  1:03 PM  Result Value Ref Range   WBC 13.5 (H) 4.0 -  10.5 K/uL   RBC 3.87 (L) 4.22 - 5.81 MIL/uL   Hemoglobin 12.4 (L) 13.0 - 17.0 g/dL   HCT 36.0 (L) 39.0 - 52.0 %   MCV 93.0 78.0 - 100.0 fL   MCH 32.0 26.0 - 34.0 pg   MCHC 34.4 30.0 - 36.0 g/dL   RDW 13.1 11.5 - 15.5 %   Platelets 143 (L) 150 - 400 K/uL   Neutrophils Relative % 88 %   Neutro Abs 11.8 (H) 1.7 - 7.7 K/uL   Lymphocytes Relative 7 %   Lymphs Abs 1.0 0.7 - 4.0 K/uL   Monocytes Relative 5 %   Monocytes Absolute 0.6 0.1 - 1.0 K/uL   Eosinophils Relative 0 %   Eosinophils Absolute 0.1 0.0 - 0.7 K/uL   Basophils Relative 0 %   Basophils Absolute 0.0 0.0 - 0.1 K/uL  CBG monitoring, ED     Status: Abnormal   Collection Time: 11/16/15  4:43 PM  Result Value Ref Range   Glucose-Capillary 137 (H) 65 - 99 mg/dL    Chest Portable 1 View  11/16/2015  CLINICAL DATA:  Pre-op imaging for fractured hip surgery tomorrow. Hx  of HTN, diabetes, stroke, prostate cancer. EXAM: PORTABLE CHEST 1 VIEW COMPARISON:  01/29/2012 FINDINGS: The heart size and mediastinal contours are within normal limits. Both lungs are clear. The visualized skeletal structures are unremarkable. IMPRESSION: No active disease. Electronically Signed   By: Nolon Nations M.D.   On: 11/16/2015 17:13   Dg Hip Unilat With Pelvis 2-3 Views Right  11/16/2015  CLINICAL DATA:  Right-sided hip pain after fall on concrete. EXAM: DG HIP (WITH OR WITHOUT PELVIS) 2-3V RIGHT COMPARISON:  None. FINDINGS: Frontal pelvis with AP and frog-leg lateral views of the right hip show intertrochanteric right femoral neck fracture. Numerous surgical clips over the pelvis suggest prior lymph node dissection. Bones are demineralized. IMPRESSION: Intertrochanteric right proximal femur fracture. Electronically Signed   By: Misty Stanley M.D.   On: 11/16/2015 13:11    Review of Systems  Constitutional: Negative for fever and chills.  HENT: Negative for ear discharge.   Eyes: Negative for blurred vision.  Genitourinary: Negative for hematuria.   Endo/Heme/Allergies: Bruises/bleeds easily.       Diabetes  Psychiatric/Behavioral: Negative for depression and suicidal ideas.   Blood pressure 142/59, pulse 73, temperature 98.5 F (36.9 C), temperature source Oral, resp. rate 18, height _0  (1.778 m), weight 78.019 kg (172 lb), SpO2 96 %. Physical Exam  Constitutional: He is oriented to person, place, and time. He appears well-developed and well-nourished.  HENT:  Head: Normocephalic.  Eyes: Pupils are equal, round, and reactive to light.  Neck: Normal range of motion.  Cardiovascular: Normal rate.   Respiratory: Effort normal.  GI: Soft. There is no rebound.  Musculoskeletal: He exhibits no edema.  Pain with right hip ROM  Neurological: He is alert and oriented to person, place, and time.  Skin: Skin is warm and dry. No rash noted. No erythema.  Psychiatric: He has a normal mood and affect. His behavior is normal. Judgment and thought content normal.    Assessment/Plan: Right IT hip Fx for surgery Sunday 7:30 AM. Discussed with pt and family, they agree to proceed.  Jaela Yepez C 11/16/2015, 7:08 PM

## 2015-11-17 NOTE — Progress Notes (Signed)
PROGRESS NOTE    Tyler [name]  [id number] DOB: [birth year] DOA: 11/16/2015 PCP: Tyler Bolt, MD   Outpatient Specialists:     Brief Narrative:  Tyler [name] is a 82 y.o. male with a Past Medical History of dm2, htn, prior cva years ago who presents with right femur frx sp fall down 3 steps on stairs. No anginal c/o, no orthopnea, no chest pain w/ exertion, no syncopal complaints, typically able to go up/down stairs w/o difficulty.   Assessment & Plan:   Active Problems:   DM2 (diabetes mellitus, type 2) ([place])   Hyperlipidemia   Hypertension   Femur fracture, right ([place])   History of nephrolithiasis   Right femur fracture.  -s/p surgery PT/OT   DM 2. On oral agent at home -Hold home oral diabetic agents -CBGs, SSI   History of CVA with mild residual slurring of speech.  -resume plavix tomm  CKD, stage 3. Cr 1.67, slightly above was it was in 2013.  -Minimize use of nephrotoxic medications  Hyperlipidemia.  -continue home statin  Hypertension. A few elevated readings in ED but overall all BP satisfactory -Continue home blood pressure medications   DVT prophylaxis:  Lovenox   Code Status: Full Code  Family Communication: Sons/wife at bedside  Disposition Plan:  PT eval   Consultants:   ortho  Procedures:  INTRAMEDULLARY (IM) NAIL FEMORAL (Right)      Subjective: No CP, no SOB Pain in hip  Objective: Filed Vitals:   11/17/15 0951 11/17/15 1006 11/17/15 1011 11/17/15 1200  BP: 157/60 145/55  147/55  Pulse: 69 76 73 64  Temp:   98.7 F (37.1 C) 97.9 F (36.6 C)  TempSrc:    Oral  Resp: 9 13 13    Height:      Weight:      SpO2: 100% 93% 96% 97%    Intake/Output Summary (Last 24 hours) at 11/17/15 1444 Last data filed at 11/17/15 1322  Gross per 24 hour  Intake   1640 ml  Output   1300 ml  Net    340 ml   Filed Weights   11/16/15 1135  Weight: 78.019 kg (172 lb)    Examination:  General exam:  Appears calm and comfortable  Respiratory system: Clear to auscultation. Respiratory effort normal. Cardiovascular system: S1 & S2 heard, RRR. [name], murmurs, rubs, gallops or clicks. No pedal edema. Gastrointestinal system: Abdomen is nondistended, soft and nontender. No organomegaly or masses felt. Normal bowel sounds heard. Central nervous system: Alert and oriented. No focal neurological deficits.     Data Reviewed: I have personally reviewed following labs and imaging studies  CBC:  Recent Labs Lab 11/16/15 1303 11/17/15 0254  WBC 13.5* 9.0  NEUTROABS 11.8*  --   HGB 12.4* 10.8*  HCT 36.0* 32.2*  MCV 93.0 92.8  PLT 143* [id number]*   Basic Metabolic Panel:  Recent Labs Lab 11/16/15 1303 11/17/15 0254  NA 139 137  K 4.2 3.9  CL 112* 109  CO2 19* 23  GLUCOSE 140* 126*  BUN 23* 25*  CREATININE 1.67* 1.51*  CALCIUM 9.0 9.1   GFR: Estimated Creatinine Clearance: 37.6 mL/min (by C-G formula based on Cr of 1.51). Liver Function Tests: No results for input(s): AST, ALT, ALKPHOS, BILITOT, PROT, ALBUMIN in the last 168 hours. No results for input(s): LIPASE, AMYLASE in the last 168 hours. No results for input(s): AMMONIA in the last 168 hours. Coagulation Profile: No results for input(s): INR, PROTIME in the  last 168 hours. Cardiac Enzymes: No results for input(s): CKTOTAL, CKMB, CKMBINDEX, TROPONINI in the last 168 hours. BNP (last 3 results) No results for input(s): PROBNP in the last 8760 hours. HbA1C: No results for input(s): HGBA1C in the last 72 hours. CBG:  Recent Labs Lab 11/16/15 1643 11/16/15 2304 11/17/15 0622 11/17/15 0909 11/17/15 1152  GLUCAP 137* 170* 109* 141* 152*   Lipid Profile: No results for input(s): CHOL, HDL, LDLCALC, TRIG, CHOLHDL, LDLDIRECT in the last 72 hours. Thyroid Function Tests: No results for input(s): TSH, T4TOTAL, FREET4, T3FREE, THYROIDAB in the last 72 hours. Anemia Panel: No results for input(s): VITAMINB12, FOLATE,  FERRITIN, TIBC, IRON, RETICCTPCT in the last 72 hours. Urine analysis:    Component Value Date/Time   COLORURINE YELLOW 11/16/2015 2138   APPEARANCEUR CLEAR 11/16/2015 2138   LABSPEC 1.020 11/16/2015 2138   PHURINE 5.0 11/16/2015 2138   GLUCOSEU NEGATIVE 11/16/2015 2138   HGBUR TRACE* 11/16/2015 2138   BILIRUBINUR NEGATIVE 11/16/2015 2138   KETONESUR NEGATIVE 11/16/2015 2138   PROTEINUR NEGATIVE 11/16/2015 2138   UROBILINOGEN 1.0 03/13/2008 1130   NITRITE NEGATIVE 11/16/2015 2138   LEUKOCYTESUR NEGATIVE 11/16/2015 2138      Recent Results (from the past 240 hour(s))  Surgical pcr screen     Status: None   Collection Time: 11/16/15  9:39 PM  Result Value Ref Range Status   MRSA, PCR NEGATIVE NEGATIVE Final   Staphylococcus aureus NEGATIVE NEGATIVE Final    Comment:        The Xpert SA Assay (FDA approved for NASAL specimens in patients over 71 years of age), is one component of a comprehensive surveillance program.  Test performance has been validated by [facility] for patients greater than or equal to 66 year old. It is not intended to diagnose infection nor to guide or monitor treatment.       Anti-infectives    Start     Dose/Rate Route Frequency Ordered Stop   11/17/15 0728  ceFAZolin (ANCEF) 2-4 GM/100ML-% IVPB    Comments:  [name]   : cabinet override      11/17/15 0728 11/17/15 0745       Radiology Studies: Chest Portable 1 View  11/16/2015  CLINICAL DATA:  Pre-op imaging for fractured hip surgery tomorrow. Hx of HTN, diabetes, stroke, prostate cancer. EXAM: PORTABLE CHEST 1 VIEW COMPARISON:  01/29/2012 FINDINGS: The heart size and mediastinal contours are within normal limits. Both lungs are clear. The visualized skeletal structures are unremarkable. IMPRESSION: No active disease. Electronically Signed   By: Nolon Nations M.D.   On: 11/16/2015 17:13   Dg C-arm 1-60 Min  11/17/2015  CLINICAL DATA:  Hip fracture.  Intra medullary rod placement.  EXAM: DG C-ARM 61-120 MIN; OPERATIVE RIGHT HIP WITH PELVIS COMPARISON:  Right hip radiographs 11/16/2015 FINDINGS: A right femoral inter medullary rod is in place. A dynamic hip screw is noted. Alignment is anatomic. A single interlocking screw is present distally. Surgical clips are again noted in the pelvis. IMPRESSION: Intra medullary right femoral rod with dynamic hip screw. Alignment is anatomic. No radiographic evidence for complication. Electronically Signed   By: San Morelle M.D.   On: 11/17/2015 10:04   Dg Hip Operative Unilat W Or W/o Pelvis Right  11/17/2015  CLINICAL DATA:  Hip fracture.  Intra medullary rod placement. EXAM: DG C-ARM 61-120 MIN; OPERATIVE RIGHT HIP WITH PELVIS COMPARISON:  Right hip radiographs 11/16/2015 FINDINGS: A right femoral inter medullary rod is in place. A dynamic  hip screw is noted. Alignment is anatomic. A single interlocking screw is present distally. Surgical clips are again noted in the pelvis. IMPRESSION: Intra medullary right femoral rod with dynamic hip screw. Alignment is anatomic. No radiographic evidence for complication. Electronically Signed   By: San Morelle M.D.   On: 11/17/2015 10:04   Dg Hip Unilat With Pelvis 2-3 Views Right  11/16/2015  CLINICAL DATA:  Right-sided hip pain after fall on concrete. EXAM: DG HIP (WITH OR WITHOUT PELVIS) 2-3V RIGHT COMPARISON:  None. FINDINGS: Frontal pelvis with AP and frog-leg lateral views of the right hip show intertrochanteric right femoral neck fracture. Numerous surgical clips over the pelvis suggest prior lymph node dissection. Bones are demineralized. IMPRESSION: Intertrochanteric right proximal femur fracture. Electronically Signed   By: Misty Stanley M.D.   On: 11/16/2015 13:11        Scheduled Meds: . amLODipine  2.5 mg Oral Daily  . atorvastatin  20 mg Oral q1800  . docusate sodium  100 mg Oral BID  . enoxaparin (LOVENOX) injection  40 mg Subcutaneous QHS  . insulin aspart  0-9  Units Subcutaneous TID WC  . losartan  50 mg Oral QHS  . pantoprazole  40 mg Oral Daily   Continuous Infusions: . sodium chloride 75 mL/hr at 11/17/15 1043     LOS: 1 day    Time spent: 25 min    [place], DO [facility] Pager [phone]  If 7PM-7AM, please contact night-coverage [email or web address] Password [facility] 11/17/2015, 2:44 PM

## 2015-11-17 NOTE — Interval H&P Note (Signed)
History and Physical Interval Note:  11/17/2015 7:32 AM  Tyler [name]  has presented today for surgery, with the diagnosis of RIGHT HIP FRACTURE - INTERTROCHANTERIC  The various methods of treatment have been discussed with the patient and family. After consideration of risks, benefits and other options for treatment, the patient has consented to  Procedure(s): INTRAMEDULLARY (IM) NAIL FEMORAL (Right) as a surgical intervention .  The patient's history has been reviewed, patient examined, no change in status, stable for surgery.  I have reviewed the patient's chart and labs.  Questions were answered to the patient's satisfaction.     Mattheu Brodersen C

## 2015-11-17 NOTE — Transfer of Care (Signed)
Immediate Anesthesia Transfer of Care Note  Patient: Tyler [name]  Procedure(s) Performed: Procedure(s): INTRAMEDULLARY (IM) NAIL FEMORAL (Right)  Patient Location: PACU  Anesthesia Type:General  Level of Consciousness: awake  Airway & Oxygen Therapy: Patient Spontanous Breathing and Patient connected to nasal cannula oxygen  Post-op Assessment: Report given to RN and Post -op Vital signs reviewed and stable  Post vital signs: Reviewed and stable  Last Vitals:  Filed Vitals:   11/17/15 0538 11/17/15 0548  BP: 163/59   Pulse: 63 65  Temp: 36.9 C   Resp: 16     Last Pain:  Filed Vitals:   11/17/15 0634  PainSc: 3          Complications: No apparent anesthesia complications

## 2015-11-17 NOTE — Brief Op Note (Signed)
11/16/2015 - 11/17/2015  8:57 AM  PATIENT:  Tyler [name]  82 y.o. male  PRE-OPERATIVE DIAGNOSIS:  RIGHT HIP FRACTURE - INTERTROCHANTERIC  POST-OPERATIVE DIAGNOSIS:  RIGHT HIP FRACTURE - INTERTROCHANTERIC  PROCEDURE:  Procedure(s): INTRAMEDULLARY (IM) NAIL FEMORAL (Right)  SURGEON:  Surgeon(s) and Role:    * Marybelle Killings, MD - Primary  PHYSICIAN ASSISTANT:   ASSISTANTS: none   ANESTHESIA:   local and general  EBL:  Total I/O In: 1200 [I.V.:1200] Out: 100 [Blood:100]  BLOOD ADMINISTERED:none  DRAINS: none   LOCAL MEDICATIONS USED:  MARCAINE     SPECIMEN:  No Specimen  DISPOSITION OF SPECIMEN:  N/A  COUNTS:  YES  TOURNIQUET:  * No tourniquets in log *  DICTATION: .Other Dictation: Dictation Number [id number]  PLAN OF CARE: already IP  PATIENT DISPOSITION:  PACU - hemodynamically stable.   Delay start of Pharmacological VTE agent (>24hrs) due to surgical blood loss or risk of bleeding: yes

## 2015-11-17 NOTE — Anesthesia Procedure Notes (Signed)
Procedure Name: Intubation Date/Time: 11/17/2015 7:42 AM Performed by: [name] A Pre-anesthesia Checklist: Patient identified, Emergency Drugs available, Suction available and Patient being monitored Patient Re-evaluated:Patient Re-evaluated prior to inductionOxygen Delivery Method: Circle System Utilized and Circle system utilized Preoxygenation: Pre-oxygenation with 100% oxygen Intubation Type: IV induction Ventilation: Mask ventilation without difficulty Laryngoscope Size: Mac and 3 Grade View: Grade I Tube type: Oral Number of attempts: 1 Airway Equipment and Method: Stylet Placement Confirmation: ETT inserted through vocal cords under direct vision,  positive ETCO2 and breath sounds checked- equal and bilateral Secured at: 21 cm Tube secured with: Tape Dental Injury: Teeth and Oropharynx as per pre-operative assessment

## 2015-11-18 ENCOUNTER — Encounter (HOSPITAL_COMMUNITY): Payer: Self-pay | Admitting: Orthopaedic Surgery

## 2015-11-18 DIAGNOSIS — S72491A Other fracture of lower end of right femur, initial encounter for closed fracture: Secondary | ICD-10-CM | POA: Diagnosis not present

## 2015-11-18 DIAGNOSIS — I10 Essential (primary) hypertension: Secondary | ICD-10-CM | POA: Diagnosis not present

## 2015-11-18 DIAGNOSIS — E785 Hyperlipidemia, unspecified: Secondary | ICD-10-CM | POA: Diagnosis not present

## 2015-11-18 LAB — CBC
HEMATOCRIT: 29.3 % — AB (ref 39.0–52.0)
HEMOGLOBIN: 9.6 g/dL — AB (ref 13.0–17.0)
MCH: 30.4 pg (ref 26.0–34.0)
MCHC: 32.8 g/dL (ref 30.0–36.0)
MCV: 92.7 fL (ref 78.0–100.0)
Platelets: 129 10*3/uL — ABNORMAL LOW (ref 150–400)
RBC: 3.16 MIL/uL — ABNORMAL LOW (ref 4.22–5.81)
RDW: 13.1 % (ref 11.5–15.5)
WBC: 9.6 10*3/uL (ref 4.0–10.5)

## 2015-11-18 LAB — BASIC METABOLIC PANEL
ANION GAP: 5 (ref 5–15)
BUN: 25 mg/dL — ABNORMAL HIGH (ref 6–20)
CALCIUM: 8.5 mg/dL — AB (ref 8.9–10.3)
CO2: 22 mmol/L (ref 22–32)
Chloride: 108 mmol/L (ref 101–111)
Creatinine, Ser: 1.57 mg/dL — ABNORMAL HIGH (ref 0.61–1.24)
GFR calc non Af Amer: 39 mL/min — ABNORMAL LOW (ref >60)
GFR, EST AFRICAN AMERICAN: 45 mL/min — AB (ref >60)
Glucose, Bld: 167 mg/dL — ABNORMAL HIGH (ref 65–99)
Potassium: 4.1 mmol/L (ref 3.5–5.1)
SODIUM: 135 mmol/L (ref 135–145)

## 2015-11-18 LAB — GLUCOSE, CAPILLARY
GLUCOSE-CAPILLARY: 159 mg/dL — AB (ref 65–99)
GLUCOSE-CAPILLARY: 242 mg/dL — AB (ref 65–99)
GLUCOSE-CAPILLARY: 129 mg/dL — AB (ref 65–99)
Glucose-Capillary: 148 mg/dL — ABNORMAL HIGH (ref 65–99)

## 2015-11-18 LAB — HEMOGLOBIN A1C
Hgb A1c MFr Bld: 6.6 % — ABNORMAL HIGH (ref 4.8–5.6)
MEAN PLASMA GLUCOSE: 143 mg/dL

## 2015-11-18 MED ORDER — OXYCODONE HCL 5 MG PO TABS
5.0000 mg | ORAL_TABLET | ORAL | Status: DC | PRN
  Administered 2015-11-18 – 2015-11-22 (×11): 5 mg via ORAL
  Filled 2015-11-18 (×11): qty 1

## 2015-11-18 MED ORDER — GLUCERNA SHAKE PO LIQD
237.0000 mL | Freq: Every day | ORAL | Status: DC
Start: 2015-11-18 — End: 2015-11-22
  Administered 2015-11-18 – 2015-11-21 (×4): 237 mL via ORAL

## 2015-11-18 NOTE — Evaluation (Signed)
Occupational Therapy Evaluation Patient Details Name: Tyler [name] MRN: [id number] DOB: 05/30/1928 Today's Date: 11/18/2015    History of Present Illness Pt is an 82 y/o male who presents s/p mechanical fall down the stairs at home, sustaining a R trochanteric hip fracture. Pt is now s/p R IM nailing on 11/17/15 and is WBAT.   Clinical Impression   Pt is a very pleasant 82 y/o with prior CVA 4 years ago. PTA pt was independent in ADL. Pt demonstrating deficits in the OT problem list (below). OT to focus on transfers for ADL (walk-in shower with lip) and toileting and LB dressing. Pt has wife who is willing and capable to provide caregiver assistance. She would benefit from experiencing education. Pt currently would like to discharge home, but is open to SNF placement for additional therapy.  OT will follow acutely as it will benefit the Pt in increasing independence and safety during ADL and transfers.    Follow Up Recommendations  Supervision/Assistance - 24 hour;Other (comment);Home health OT (This is subject to change if the discharge changes to SNF)    Equipment Recommendations  3 in 1 bedside comode    Recommendations for Other Services       Precautions / Restrictions Precautions Precautions: Fall Restrictions Weight Bearing Restrictions: Yes RLE Weight Bearing: Weight bearing as tolerated      Mobility Bed Mobility Overal bed mobility: Needs Assistance Bed Mobility: Supine to Sit     Supine to sit: Mod assist;HOB elevated     General bed mobility comments: Assist for LE movement towards EOB as well as for trunk elevation to full sitting position. Increased time required for pt to scoot fully to EOB.   Transfers Overall transfer level: Needs assistance Equipment used: Rolling walker (2 wheeled) Transfers: Sit to/from Stand Sit to Stand: Mod assist         General transfer comment: Pt was able to power-up to full standing position with mod assist for balance  support. Increased time required for pt to extend hips and improve posture to full standing.     Balance Overall balance assessment: Needs assistance Sitting-balance support: Feet supported;No upper extremity supported Sitting balance-Leahy Scale: Fair     Standing balance support: No upper extremity supported;During functional activity Standing balance-Leahy Scale: Poor Standing balance comment: Requires UE support at this time to maintain standing balance.                             ADL Overall ADL's : Needs assistance/impaired Eating/Feeding: Modified independent;Set up   Grooming: Brushing hair;Applying deodorant;Oral care;Set up;Modified independent Grooming Details (indicate cue type and reason): Pt able to perform grooming sitting in chair with set up Upper Body Bathing: Set up;Supervision/ safety   Lower Body Bathing: Set up;With caregiver independent assisting;With adaptive equipment;Minimal assistance (extended sponge) Lower Body Bathing Details (indicate cue type and reason): Pt edcuated on using AE to reach lower body while sitting in the shower. Pt says he (and his wife) have experince because of CVA 4 years ago. Upper Body Dressing : Min guard;Set up   Lower Body Dressing: Maximal assistance;Sitting/lateral leans;Sit to/from stand Lower Body Dressing Details (indicate cue type and reason): Pt able to reach don/doff sock on L foot but total assist on R foot. OT discussed and introduced AE for LB dressing. Pt says that his wife is willing and able to help him dress at home.  Functional mobility during ADLs: +2 for safety/equipment General ADL Comments: Pt needs set up, but can perform upper body bathing/grooming/dressing mod I. Pt limited by pain and ambulation safety which impacts lower body dressing and bathing. AE introduced and education provided.     Vision     Perception     Praxis      Pertinent Vitals/Pain Pain Assessment:  No/denies pain (during sitting) Pain Score: 6  Pain Location: R hip during movement Pain Descriptors / Indicators: Operative site guarding;Sore Pain Intervention(s): Limited activity within patient's tolerance;Ice applied;Monitored during session     Hand Dominance Right   Extremity/Trunk Assessment Upper Extremity Assessment Upper Extremity Assessment: Overall WFL for tasks assessed   Lower Extremity Assessment Lower Extremity Assessment: RLE deficits/detail RLE Deficits / Details: Decreased strength and AROM consistent with above mentioned procedure.    Cervical / Trunk Assessment Cervical / Trunk Assessment: Other exceptions Cervical / Trunk Exceptions: Forward head/rounded shoulder posture   Communication Communication Communication: HOH   Cognition Arousal/Alertness: Awake/alert Behavior During Therapy: WFL for tasks assessed/performed Overall Cognitive Status: Within Functional Limits for tasks assessed                     General Comments       Exercises       Shoulder Instructions      Home Living Family/patient expects to be discharged to:: Private residence Living Arrangements: Spouse/significant other Available Help at Discharge: Family;Available 24 hours/day Type of Home: House Home Access: Level entry     Home Layout: Two level;Able to live on main level with bedroom/bathroom Alternate Level Stairs-Number of Steps: 2 Alternate Level Stairs-Rails: Right Bathroom Shower/Tub: Walk-in shower (with seat inside)   Bathroom Toilet: Handicapped height Bathroom Accessibility: Yes How Accessible: Accessible via walker Home Equipment: [place] - single point;Shower seat - built in;Grab bars - tub/shower;Hand held shower head          Prior Functioning/Environment Level of Independence: Independent             OT Diagnosis: Generalized weakness;Acute pain   OT Problem List: Decreased strength;Decreased activity tolerance;Decreased safety  awareness;Decreased knowledge of use of DME or AE;Pain   OT Treatment/Interventions: Self-care/ADL training;DME and/or AE instruction;Patient/family education    OT Goals(Current goals can be found in the care plan section) Acute Rehab OT Goals Patient Stated Goal: Return home at d/c  OT Goal Formulation: With patient Time For Goal Achievement: 11/25/15 Potential to Achieve Goals: Good ADL Goals Pt Will Perform Lower Body Bathing: with modified independence;with set-up;with adaptive equipment;sit to/from stand Pt Will Perform Lower Body Dressing: with min guard assist;with caregiver independent in assisting;with adaptive equipment;sit to/from stand Pt Will Transfer to Toilet: with min guard assist;ambulating Pt Will Perform Toileting - Clothing Manipulation and hygiene: with min guard assist;with caregiver independent in assisting;sit to/from stand Pt Will Perform Tub/Shower Transfer: with supervision;with caregiver independent in assisting;ambulating;grab bars;rolling walker  OT Frequency: Min 3X/week   Barriers to D/C: Decreased caregiver support  Discussed Pt d/c to SNF for addition rehabilitation if Pt not able to abulate and sit/stand for ADL.       Co-evaluation              End of Session Equipment Utilized During Treatment: Other (comment) (recliner chair)  Activity Tolerance: Patient limited by pain Patient left: in chair;with call bell/phone within reach   Time: [id number] OT Time Calculation (min): 34 min Charges:  OT General Charges $OT Visit: 1 Procedure OT Evaluation $OT  Eval Moderate Complexity: 1 Procedure OT Treatments $Self Care/Home Management : 8-22 mins G-Codes:    Merri Ray Martavion Couper OTR/L 11/18/2015, 11:43 AM [phone]

## 2015-11-18 NOTE — Progress Notes (Signed)
PROGRESS NOTE    Tyler [name]  [id number] DOB: [birth year] DOA: 11/16/2015 PCP: Dwan Bolt, MD   Outpatient Specialists:     Brief Narrative:  Tyler [name] is a 82 y.o. male with a Past Medical History of dm2, htn, prior cva years ago who presents with right femur frx sp fall down 3 steps on stairs. No anginal c/o, no orthopnea, no chest pain w/ exertion, no syncopal complaints, typically able to go up/down stairs w/o difficulty.   Assessment & Plan:   Active Problems:   DM2 (diabetes mellitus, type 2) ([place])   Hyperlipidemia   Hypertension   Femur fracture, right ([place])   History of nephrolithiasis   Right femur fracture.  -s/p surgery PT/OT eval   DM 2. On oral agent at home -Hold home oral diabetic agents -CBGs, SSI   History of CVA with mild residual slurring of speech.  -resume plavix  CKD, stage 3. Cr 1.67, slightly above was it was in 2013.  -Minimize use of nephrotoxic medications  Hyperlipidemia.  -continue home statin  Hypertension. A few elevated readings in ED but overall all BP satisfactory -Continue home blood pressure medications   DVT prophylaxis:  Lovenox   Code Status: Full Code  Family Communication: Sons/wife at bedside  Disposition Plan:  PT eval   Consultants:   ortho  Procedures:  INTRAMEDULLARY (IM) NAIL FEMORAL (Right)      Subjective: Getting up with PT, asking for pain medications  Objective: Filed Vitals:   11/17/15 2323 11/18/15 0000 11/18/15 0030 11/18/15 0407  BP:   134/58 142/53  Pulse: 65  71 78  Temp:   98.6 F (37 C) 98.9 F (37.2 C)  TempSrc:   Oral Oral  Resp: 18  16 17   Height:      Weight:      SpO2: 98% 94% 94% 97%    Intake/Output Summary (Last 24 hours) at 11/18/15 0829 Last data filed at 11/18/15 0449  Gross per 24 hour  Intake   1760 ml  Output    800 ml  Net    960 ml   Filed Weights   11/16/15 1135  Weight: 78.019 kg (172 lb)     Examination:  General exam: Appears calm and comfortable  Respiratory system: Clear to auscultation. Respiratory effort normal. Cardiovascular system: S1 & S2 heard, RRR. [name], murmurs, rubs, gallops or clicks. No pedal edema. Gastrointestinal system: Abdomen is nondistended, soft and nontender. No organomegaly or masses felt. Normal bowel sounds heard. Central nervous system: Alert and oriented. No focal neurological deficits, mild right arm tremor     Data Reviewed: I have personally reviewed following labs and imaging studies  CBC:  Recent Labs Lab 11/16/15 1303 11/17/15 0254 11/18/15 0340  WBC 13.5* 9.0 9.6  NEUTROABS 11.8*  --   --   HGB 12.4* 10.8* 9.6*  HCT 36.0* 32.2* 29.3*  MCV 93.0 92.8 92.7  PLT 143* 140* [id number]*   Basic Metabolic Panel:  Recent Labs Lab 11/16/15 1303 11/17/15 0254 11/18/15 0340  NA 139 137 135  K 4.2 3.9 4.1  CL 112* 109 108  CO2 19* 23 22  GLUCOSE 140* 126* 167*  BUN 23* 25* 25*  CREATININE 1.67* 1.51* 1.57*  CALCIUM 9.0 9.1 8.5*   GFR: Estimated Creatinine Clearance: 36.2 mL/min (by C-G formula based on Cr of 1.57). Liver Function Tests: No results for input(s): AST, ALT, ALKPHOS, BILITOT, PROT, ALBUMIN in the last 168 hours. No results for input(s): LIPASE,  AMYLASE in the last 168 hours. No results for input(s): AMMONIA in the last 168 hours. Coagulation Profile: No results for input(s): INR, PROTIME in the last 168 hours. Cardiac Enzymes: No results for input(s): CKTOTAL, CKMB, CKMBINDEX, TROPONINI in the last 168 hours. BNP (last 3 results) No results for input(s): PROBNP in the last 8760 hours. HbA1C: No results for input(s): HGBA1C in the last 72 hours. CBG:  Recent Labs Lab 11/17/15 0909 11/17/15 1152 11/17/15 1558 11/17/15 2115 11/18/15 0635  GLUCAP 141* 152* 212* 153* 159*   Lipid Profile: No results for input(s): CHOL, HDL, LDLCALC, TRIG, CHOLHDL, LDLDIRECT in the last 72 hours. Thyroid Function  Tests: No results for input(s): TSH, T4TOTAL, FREET4, T3FREE, THYROIDAB in the last 72 hours. Anemia Panel: No results for input(s): VITAMINB12, FOLATE, FERRITIN, TIBC, IRON, RETICCTPCT in the last 72 hours. Urine analysis:    Component Value Date/Time   COLORURINE YELLOW 11/16/2015 2138   APPEARANCEUR CLEAR 11/16/2015 2138   LABSPEC 1.020 11/16/2015 2138   PHURINE 5.0 11/16/2015 2138   GLUCOSEU NEGATIVE 11/16/2015 2138   HGBUR TRACE* 11/16/2015 2138   BILIRUBINUR NEGATIVE 11/16/2015 2138   KETONESUR NEGATIVE 11/16/2015 2138   PROTEINUR NEGATIVE 11/16/2015 2138   UROBILINOGEN 1.0 03/13/2008 1130   NITRITE NEGATIVE 11/16/2015 2138   LEUKOCYTESUR NEGATIVE 11/16/2015 2138      Recent Results (from the past 240 hour(s))  Surgical pcr screen     Status: None   Collection Time: 11/16/15  9:39 PM  Result Value Ref Range Status   MRSA, PCR NEGATIVE NEGATIVE Final   Staphylococcus aureus NEGATIVE NEGATIVE Final    Comment:        The Xpert SA Assay (FDA approved for NASAL specimens in patients over 74 years of age), is one component of a comprehensive surveillance program.  Test performance has been validated by [facility] for patients greater than or equal to 35 year old. It is not intended to diagnose infection nor to guide or monitor treatment.       Anti-infectives    Start     Dose/Rate Route Frequency Ordered Stop   11/17/15 0728  ceFAZolin (ANCEF) 2-4 GM/100ML-% IVPB    Comments:  [name]   : cabinet override      11/17/15 0728 11/17/15 0745       Radiology Studies: Chest Portable 1 View  11/16/2015  CLINICAL DATA:  Pre-op imaging for fractured hip surgery tomorrow. Hx of HTN, diabetes, stroke, prostate cancer. EXAM: PORTABLE CHEST 1 VIEW COMPARISON:  01/29/2012 FINDINGS: The heart size and mediastinal contours are within normal limits. Both lungs are clear. The visualized skeletal structures are unremarkable. IMPRESSION: No active disease. Electronically  Signed   By: Nolon Nations M.D.   On: 11/16/2015 17:13   Dg C-arm 1-60 Min  11/17/2015  CLINICAL DATA:  Hip fracture.  Intra medullary rod placement. EXAM: DG C-ARM 61-120 MIN; OPERATIVE RIGHT HIP WITH PELVIS COMPARISON:  Right hip radiographs 11/16/2015 FINDINGS: A right femoral inter medullary rod is in place. A dynamic hip screw is noted. Alignment is anatomic. A single interlocking screw is present distally. Surgical clips are again noted in the pelvis. IMPRESSION: Intra medullary right femoral rod with dynamic hip screw. Alignment is anatomic. No radiographic evidence for complication. Electronically Signed   By: San Morelle M.D.   On: 11/17/2015 10:04   Dg Hip Operative Unilat W Or W/o Pelvis Right  11/17/2015  CLINICAL DATA:  Hip fracture.  Intra medullary rod placement. EXAM: DG  C-ARM 61-120 MIN; OPERATIVE RIGHT HIP WITH PELVIS COMPARISON:  Right hip radiographs 11/16/2015 FINDINGS: A right femoral inter medullary rod is in place. A dynamic hip screw is noted. Alignment is anatomic. A single interlocking screw is present distally. Surgical clips are again noted in the pelvis. IMPRESSION: Intra medullary right femoral rod with dynamic hip screw. Alignment is anatomic. No radiographic evidence for complication. Electronically Signed   By: San Morelle M.D.   On: 11/17/2015 10:04   Dg Hip Unilat With Pelvis 2-3 Views Right  11/16/2015  CLINICAL DATA:  Right-sided hip pain after fall on concrete. EXAM: DG HIP (WITH OR WITHOUT PELVIS) 2-3V RIGHT COMPARISON:  None. FINDINGS: Frontal pelvis with AP and frog-leg lateral views of the right hip show intertrochanteric right femoral neck fracture. Numerous surgical clips over the pelvis suggest prior lymph node dissection. Bones are demineralized. IMPRESSION: Intertrochanteric right proximal femur fracture. Electronically Signed   By: Misty Stanley M.D.   On: 11/16/2015 13:11        Scheduled Meds: . amLODipine  2.5 mg Oral Daily  .  atorvastatin  20 mg Oral q1800  . clopidogrel  75 mg Oral Q breakfast  . docusate sodium  100 mg Oral BID  . enoxaparin (LOVENOX) injection  40 mg Subcutaneous QHS  . insulin aspart  0-9 Units Subcutaneous TID WC  . losartan  50 mg Oral QHS  . pantoprazole  40 mg Oral Daily   Continuous Infusions:     LOS: 2 days    Time spent: 25 min    [place], DO [facility] Pager [phone]  If 7PM-7AM, please contact night-coverage [email or web address] Password [email or web address] 11/18/2015, 8:29 AM

## 2015-11-18 NOTE — Evaluation (Signed)
Physical Therapy Evaluation Patient Details Name: Tyler [name] MRN: [id number] DOB: 25-Mar-1929 Today's Date: 11/18/2015   History of Present Illness  Pt is an 82 y/o male who presents s/p mechanical fall down the stairs at home, sustaining a R trochanteric hip fracture. Pt is now s/p R IM nailing on 11/17/15 and is WBAT.  Clinical Impression  Pt admitted with above diagnosis. Pt currently with functional limitations due to the deficits listed below (see PT Problem List). At the time of PT eval pt was able to perform transfers and ambulation with min to mod assist (+2 helpful for ambulation/chair follow). Will see tomorrow to firm up disposition. At this time, planning for return home at d/c, however if pt does not show improvement next session may want to consider SNF. Pt agreeable to SNF if needbe but would prefer to return home with wife. Pt will benefit from skilled PT to increase their independence and safety with mobility to allow discharge to the venue listed below.       Follow Up Recommendations Home health PT;Supervision for mobility/OOB    Equipment Recommendations  Rolling walker with 5" wheels;3in1 (PT)    Recommendations for Other Services       Precautions / Restrictions Precautions Precautions: Fall Restrictions Weight Bearing Restrictions: Yes RLE Weight Bearing: Weight bearing as tolerated      Mobility  Bed Mobility Overal bed mobility: Needs Assistance Bed Mobility: Supine to Sit     Supine to sit: Mod assist;HOB elevated     General bed mobility comments: Assist for LE movement towards EOB as well as for trunk elevation to full sitting position. Increased time required for pt to scoot fully to EOB.   Transfers Overall transfer level: Needs assistance Equipment used: Rolling walker (2 wheeled) Transfers: Sit to/from Stand Sit to Stand: Mod assist         General transfer comment: Pt was able to power-up to full standing position with mod assist for  balance support. Increased time required for pt to extend hips and improve posture to full standing.   Ambulation/Gait Ambulation/Gait assistance: Min assist;+2 safety/equipment Ambulation Distance (Feet): 5 Feet Assistive device: Rolling walker (2 wheeled) Gait Pattern/deviations: Step-to pattern;Decreased stride length;Trunk flexed Gait velocity: Decreased Gait velocity interpretation: Below normal speed for age/gender General Gait Details: Step-by-step cues for proper sequencing and advancement of RLE. Pt limited by pain and noted 1 instance of RLE buckle with weight bearing (pt states due to pain).  Stairs            Wheelchair Mobility    Modified Rankin (Stroke Patients Only)       Balance Overall balance assessment: Needs assistance Sitting-balance support: Feet supported;No upper extremity supported Sitting balance-Leahy Scale: Fair     Standing balance support: No upper extremity supported;During functional activity Standing balance-Leahy Scale: Poor Standing balance comment: Requires UE support at this time to maintain standing balance.                              Pertinent Vitals/Pain Pain Assessment: 0-10 Pain Score: 6  Pain Location: R hip at rest Pain Descriptors / Indicators: Operative site guarding;Sore Pain Intervention(s): Limited activity within patient's tolerance;Monitored during session;Repositioned    Home Living Family/patient expects to be discharged to:: Private residence Living Arrangements: Spouse/significant other Available Help at Discharge: Family;Available 24 hours/day (For the first week. Wife works) Type of Home: House Home Access: Level entry  Home Layout: Two level;Able to live on main level with bedroom/bathroom Home Equipment: Kasandra Knudsen - single point;Shower seat - built in      Prior Function Level of Independence: Independent               Hand Dominance   Dominant Hand: Right    Extremity/Trunk  Assessment   Upper Extremity Assessment: Defer to OT evaluation           Lower Extremity Assessment: RLE deficits/detail RLE Deficits / Details: Decreased strength and AROM consistent with above mentioned procedure.     Cervical / Trunk Assessment: Other exceptions  Communication   Communication: HOH  Cognition Arousal/Alertness: Awake/alert Behavior During Therapy: WFL for tasks assessed/performed Overall Cognitive Status: Within Functional Limits for tasks assessed                      General Comments      Exercises        Assessment/Plan    PT Assessment Patient needs continued PT services  PT Diagnosis Difficulty walking;Acute pain   PT Problem List Decreased strength;Decreased range of motion;Decreased activity tolerance;Decreased balance;Decreased mobility;Decreased knowledge of use of DME;Decreased safety awareness;Decreased knowledge of precautions;Pain  PT Treatment Interventions DME instruction;Gait training;Stair training;Functional mobility training;Therapeutic activities;Therapeutic exercise;Neuromuscular re-education;Patient/family education   PT Goals (Current goals can be found in the Care Plan section) Acute Rehab PT Goals Patient Stated Goal: Return home at d/c  PT Goal Formulation: With patient Time For Goal Achievement: 11/25/15 Potential to Achieve Goals: Good    Frequency Min 5X/week   Barriers to discharge        Co-evaluation               End of Session Equipment Utilized During Treatment: Gait belt Activity Tolerance: Patient limited by pain Patient left: in chair;with chair alarm set;with call bell/phone within reach Nurse Communication: Mobility status         Time: 0800-0830 PT Time Calculation (min) (ACUTE ONLY): 30 min   Charges:   PT Evaluation $PT Eval Moderate Complexity: 1 Procedure PT Treatments $Gait Training: 8-22 mins   PT G Codes:        Rolinda Roan 11-28-2015, 9:04 AM   Rolinda Roan,  PT, DPT Acute Rehabilitation Services Pager: [phone]

## 2015-11-18 NOTE — Progress Notes (Signed)
Subjective: Doing well.  Pain controlled.  No specific complaints.    Objective: Vital signs in last 24 hours: Temp:  [97.9 F (36.6 C)-98.9 F (37.2 C)] 98.2 F (36.8 C) (07/17 0831) Pulse Rate:  [64-94] 94 (07/17 0831) Resp:  [13-18] 18 (07/17 0831) BP: (134-147)/(45-58) 143/45 mmHg (07/17 0831) SpO2:  [93 %-98 %] 98 % (07/17 0831)  Intake/Output from previous day: 07/16 0701 - 07/17 0700 In: 1760 [P.O.:560; I.V.:1200] Out: 800 [Urine:700; Blood:100] Intake/Output this shift:     Recent Labs  11/16/15 1303 11/17/15 0254 11/18/15 0340  HGB 12.4* 10.8* 9.6*    Recent Labs  11/17/15 0254 11/18/15 0340  WBC 9.0 9.6  RBC 3.47* 3.16*  HCT 32.2* 29.3*  PLT 140* 129*    Recent Labs  11/17/15 0254 11/18/15 0340  NA 137 135  K 3.9 4.1  CL 109 108  CO2 23 22  BUN 25* 25*  CREATININE 1.51* 1.57*  GLUCOSE 126* 167*  CALCIUM 9.1 8.5*   No results for input(s): LABPT, INR in the last 72 hours.  Exam:  Alert and oriented.  NAD.  Dressings c/d/i.  bilat calves nontender, NVI.   Assessment/Plan: Start PT.  May need short snf placement but will see how he progresses.  WBAT.    Tray Klayman M 11/18/2015, 10:01 AM

## 2015-11-18 NOTE — Care Management Important Message (Signed)
Important Message  Patient Details  Name: Tyler [name] MRN: ID:[id number] Date of Birth: Aug 22, 1928   Medicare Important Message Given:  Yes    Loann Quill 11/18/2015, 3:05 PM

## 2015-11-18 NOTE — Clinical Documentation Improvement (Signed)
Internal Medicine  Abnormal Lab/Test Results:  H&H has dropped from 12.4/36  to  9.6/29  over a two day period following IM nailing of right hip. Please provide a diagnosis associated with the abnormal lab results and document findings in next progress note if applicable. Thank you!  Possible Clinical Conditions associated with below indicators  Acute Blood Loss Anemia  Acute on Chronic Blood Loss Anemia  Precipitous drop in Hematocrit  Other Condition  Cannot Clinically Determine  Supporting Information:  Treatment Provided:  Serial daily monitoring of CBC  Please exercise your independent, professional judgment when responding. A specific answer is not anticipated or expected.  Thank You,  Norm Parcel RN, BSN, [place] Clinical Documentation Specialist [facility] [phone]; cell [phone]

## 2015-11-18 NOTE — Progress Notes (Signed)
Initial Nutrition Assessment   DOCUMENTATION CODES:   Not applicable  INTERVENTION:  Provide Glucerna Shake po once daily, each supplement provides 220 kcal and 10 grams of protein.  Encourage adequate PO intake.   NUTRITION DIAGNOSIS:   Increased nutrient needs related to  (s/p surgery) as evidenced by estimated needs.  GOAL:   Patient will meet greater than or equal to 90% of their needs  MONITOR:   PO intake, Supplement acceptance, Weight trends, Labs, I & O's, Skin  REASON FOR ASSESSMENT:   Consult Hip fracture protocol  ASSESSMENT:   82 y.o. male with a Past Medical History of dm2, htn, prior cva years ago who presents with right femur frx sp fall down 3 steps on stairs.  PROCEDURE:(7/16): INTRAMEDULLARY (IM) NAIL FEMORAL (Right)  Pt reports having a good appetite currently and PTA with usual consumption of at least 2 meals a day with snacks in between. No recorded current meal completion, however pt reports po intake has been 100% at meals. Weight has been stable per records. Pt is agreeable to nutritional supplements to aid in healing. RD to order.  Nutrition-Focused physical exam completed. Findings are no fat depletion, mild to moderate muscle depletion, and mild edema. Muscle depletion likely associated with the natural aging process.  Labs and medications reviewed. CBGs 141-242 mg/dL.  Diet Order:  Diet Carb Modified Fluid consistency:: Thin; Room service appropriate?: Yes  Skin:   (Incision on R hip)  Last BM:  7/14  Height:   Ht Readings from Last 1 Encounters:  11/16/15 5\' 10"  (1.778 m)    Weight:   Wt Readings from Last 1 Encounters:  11/16/15 172 lb (78.019 kg)    Ideal Body Weight:  75.45 kg  BMI:  Body mass index is 24.68 kg/(m^2).  Estimated Nutritional Needs:   Kcal:  [id number]  Protein:  75-85 grams  Fluid:  1.9 - 2 L/day  EDUCATION NEEDS:   Education needs addressed  Corrin Parker, MS, RD, LDN Pager # [phone] After  hours/ weekend pager # [phone]

## 2015-11-18 NOTE — Clinical Documentation Improvement (Signed)
Internal Medicine  Please clarify if you're in agreement with statement documented below found in Pre Op Anesthesia note and document findings in next progress note. Thank you!   Right Hemiparesis ruled in  Right Hemiparesis ruled out  Other  Clinically Undetermined  Supporting Information:  "CVA (right sided weakness), Residual Symptoms". This codes to Hemiplegia and hemiparesis following cerebral infarction affecting right dominant side.   Please exercise your independent, professional judgment when responding. A specific answer is not anticipated or expected.  Thank You, Norm Parcel RN, BSN, [place] Clinical Documentation Specialist [facility] [phone]; cell [phone]

## 2015-11-19 DIAGNOSIS — I10 Essential (primary) hypertension: Secondary | ICD-10-CM | POA: Diagnosis not present

## 2015-11-19 DIAGNOSIS — S72491A Other fracture of lower end of right femur, initial encounter for closed fracture: Secondary | ICD-10-CM | POA: Diagnosis not present

## 2015-11-19 DIAGNOSIS — E785 Hyperlipidemia, unspecified: Secondary | ICD-10-CM | POA: Diagnosis not present

## 2015-11-19 LAB — BASIC METABOLIC PANEL
Anion gap: 6 (ref 5–15)
BUN: 26 mg/dL — AB (ref 6–20)
CHLORIDE: 106 mmol/L (ref 101–111)
CO2: 24 mmol/L (ref 22–32)
CREATININE: 1.63 mg/dL — AB (ref 0.61–1.24)
Calcium: 8.6 mg/dL — ABNORMAL LOW (ref 8.9–10.3)
GFR calc Af Amer: 43 mL/min — ABNORMAL LOW (ref >60)
GFR calc non Af Amer: 37 mL/min — ABNORMAL LOW (ref >60)
GLUCOSE: 132 mg/dL — AB (ref 65–99)
Potassium: 4.1 mmol/L (ref 3.5–5.1)
Sodium: 136 mmol/L (ref 135–145)

## 2015-11-19 LAB — CBC
HCT: 26.6 % — ABNORMAL LOW (ref 39.0–52.0)
Hemoglobin: 8.9 g/dL — ABNORMAL LOW (ref 13.0–17.0)
MCH: 31.1 pg (ref 26.0–34.0)
MCHC: 33.5 g/dL (ref 30.0–36.0)
MCV: 93 fL (ref 78.0–100.0)
PLATELETS: 124 10*3/uL — AB (ref 150–400)
RBC: 2.86 MIL/uL — ABNORMAL LOW (ref 4.22–5.81)
RDW: 13.1 % (ref 11.5–15.5)
WBC: 9.7 10*3/uL (ref 4.0–10.5)

## 2015-11-19 LAB — GLUCOSE, CAPILLARY
GLUCOSE-CAPILLARY: 134 mg/dL — AB (ref 65–99)
Glucose-Capillary: 175 mg/dL — ABNORMAL HIGH (ref 65–99)
Glucose-Capillary: 193 mg/dL — ABNORMAL HIGH (ref 65–99)
Glucose-Capillary: 214 mg/dL — ABNORMAL HIGH (ref 65–99)

## 2015-11-19 MED ORDER — HYDROCODONE-ACETAMINOPHEN 7.5-325 MG PO TABS: 1.0000 | ORAL_TABLET | Freq: Four times a day (QID) | ORAL | Status: DC | PRN

## 2015-11-19 NOTE — Progress Notes (Signed)
PROGRESS NOTE    Tyler [name]  [id number] DOB: 1928/08/08 DOA: 11/16/2015 PCP: Dwan Bolt, MD   Outpatient Specialists:     Brief Narrative:  Tyler [name] is a 82 y.o. male with a Past Medical History of dm2, htn, prior cva years ago who presents with right femur frx sp fall down 3 steps on stairs. No anginal c/o, no orthopnea, no chest pain w/ exertion, no syncopal complaints, typically able to go up/down stairs w/o difficulty.   Assessment & Plan:   Active Problems:   DM2 (diabetes mellitus, type 2) ([place])   Hyperlipidemia   Hypertension   Femur fracture, right ([place])   History of nephrolithiasis   Right femur fracture.  -s/p surgery PT/OT eval   DM 2. On oral agent at home -Hold home oral diabetic agents -CBGs, SSI   History of CVA with mild residual slurring of speech.  -resume plavix  CKD, stage 3. Cr 1.67, slightly above was it was in 2013.  -Minimize use of nephrotoxic medications  Hyperlipidemia.  -continue home statin  Hypertension. A few elevated readings in ED but overall all BP satisfactory -Continue home blood pressure medications   DVT prophylaxis:  Lovenox   Code Status: Full Code  Family Communication: Sons/wife at bedside  Disposition Plan:  PT eval   Consultants:   ortho  Procedures:  INTRAMEDULLARY (IM) NAIL FEMORAL (Right)      Subjective: No overnight events Agreeable for SNF  Objective: Filed Vitals:   11/18/15 1300 11/18/15 2016 11/19/15 0630 11/19/15 1400  BP: 148/74 118/62 125/71 122/48  Pulse: 82 77 81 89  Temp: 97.6 F (36.4 C) 99 F (37.2 C) 98.6 F (37 C) 98.3 F (36.8 C)  TempSrc: Oral Oral  Oral  Resp: 18 18 18 18   Height:      Weight:      SpO2: 100% 97% 98% 98%    Intake/Output Summary (Last 24 hours) at 11/19/15 1502 Last data filed at 11/19/15 1100  Gross per 24 hour  Intake    580 ml  Output    100 ml  Net    480 ml   Filed Weights   11/16/15 1135    Weight: 78.019 kg (172 lb)    Examination:  General exam: Appears calm and comfortable  Respiratory system: Clear to auscultation. Respiratory effort normal. Cardiovascular system: S1 & S2 heard, RRR. [name], murmurs, rubs, gallops or clicks. No pedal edema. Gastrointestinal system: Abdomen is nondistended, soft and nontender. No organomegaly or masses felt. Normal bowel sounds heard. Central nervous system: Alert and oriented. No focal neurological deficits, mild right arm tremor     Data Reviewed: I have personally reviewed following labs and imaging studies  CBC:  Recent Labs Lab 11/16/15 1303 11/17/15 0254 11/18/15 0340 11/19/15 0538  WBC 13.5* 9.0 9.6 9.7  NEUTROABS 11.8*  --   --   --   HGB 12.4* 10.8* 9.6* 8.9*  HCT 36.0* 32.2* 29.3* 26.6*  MCV 93.0 92.8 92.7 93.0  PLT 143* 140* 129* [id number]*   Basic Metabolic Panel:  Recent Labs Lab 11/16/15 1303 11/17/15 0254 11/18/15 0340 11/19/15 0538  NA 139 137 135 136  K 4.2 3.9 4.1 4.1  CL 112* 109 108 106  CO2 19* 23 22 24   GLUCOSE 140* 126* 167* 132*  BUN 23* 25* 25* 26*  CREATININE 1.67* 1.51* 1.57* 1.63*  CALCIUM 9.0 9.1 8.5* 8.6*   GFR: Estimated Creatinine Clearance: 34.8 mL/min (by C-G formula based on  Cr of 1.63). Liver Function Tests: No results for input(s): AST, ALT, ALKPHOS, BILITOT, PROT, ALBUMIN in the last 168 hours. No results for input(s): LIPASE, AMYLASE in the last 168 hours. No results for input(s): AMMONIA in the last 168 hours. Coagulation Profile: No results for input(s): INR, PROTIME in the last 168 hours. Cardiac Enzymes: No results for input(s): CKTOTAL, CKMB, CKMBINDEX, TROPONINI in the last 168 hours. BNP (last 3 results) No results for input(s): PROBNP in the last 8760 hours. HbA1C:  Recent Labs  11/16/15 1651  HGBA1C 6.6*   CBG:  Recent Labs Lab 11/18/15 1113 11/18/15 1609 11/18/15 2205 11/19/15 0653 11/19/15 1243  GLUCAP 242* 129* 148* 134* 175*   Lipid  Profile: No results for input(s): CHOL, HDL, LDLCALC, TRIG, CHOLHDL, LDLDIRECT in the last 72 hours. Thyroid Function Tests: No results for input(s): TSH, T4TOTAL, FREET4, T3FREE, THYROIDAB in the last 72 hours. Anemia Panel: No results for input(s): VITAMINB12, FOLATE, FERRITIN, TIBC, IRON, RETICCTPCT in the last 72 hours. Urine analysis:    Component Value Date/Time   COLORURINE YELLOW 11/16/2015 2138   APPEARANCEUR CLEAR 11/16/2015 2138   LABSPEC 1.020 11/16/2015 2138   PHURINE 5.0 11/16/2015 2138   GLUCOSEU NEGATIVE 11/16/2015 2138   HGBUR TRACE* 11/16/2015 2138   BILIRUBINUR NEGATIVE 11/16/2015 2138   KETONESUR NEGATIVE 11/16/2015 2138   PROTEINUR NEGATIVE 11/16/2015 2138   UROBILINOGEN 1.0 03/13/2008 1130   NITRITE NEGATIVE 11/16/2015 2138   LEUKOCYTESUR NEGATIVE 11/16/2015 2138      Recent Results (from the past 240 hour(s))  Surgical pcr screen     Status: None   Collection Time: 11/16/15  9:39 PM  Result Value Ref Range Status   MRSA, PCR NEGATIVE NEGATIVE Final   Staphylococcus aureus NEGATIVE NEGATIVE Final    Comment:        The Xpert SA Assay (FDA approved for NASAL specimens in patients over 62 years of age), is one component of a comprehensive surveillance program.  Test performance has been validated by [facility] for patients greater than or equal to 62 year old. It is not intended to diagnose infection nor to guide or monitor treatment.       Anti-infectives    Start     Dose/Rate Route Frequency Ordered Stop   11/17/15 0728  ceFAZolin (ANCEF) 2-4 GM/100ML-% IVPB    Comments:  [name]   : cabinet override      11/17/15 [id number] 11/17/15 0745       Radiology Studies: No results found.      Scheduled Meds: . amLODipine  2.5 mg Oral Daily  . atorvastatin  20 mg Oral q1800  . clopidogrel  75 mg Oral Q breakfast  . docusate sodium  100 mg Oral BID  . enoxaparin (LOVENOX) injection  40 mg Subcutaneous QHS  . feeding supplement  (GLUCERNA SHAKE)  237 mL Oral Q1500  . insulin aspart  0-9 Units Subcutaneous TID WC  . losartan  50 mg Oral QHS  . pantoprazole  40 mg Oral Daily   Continuous Infusions:     LOS: 3 days    Time spent: 25 min    [place], DO [facility] Pager [phone]  If 7PM-7AM, please contact night-coverage [email or web address] Password [email or web address] 11/19/2015, 3:02 PM

## 2015-11-19 NOTE — Progress Notes (Signed)
Occupational Therapy Treatment Patient Details Name: Tyler [name] MRN: [id number] DOB: Sep 14, 1928 Today's Date: 11/19/2015    History of present illness Pt is an 82 y/o male who presents s/p mechanical fall down the stairs at home, sustaining a R trochanteric hip fracture. Pt is now s/p R IM nailing on 11/17/15 and is WBAT.   OT comments  Pt making slow progress with functional goals and he is concerned about discharging home and instead would like to d/c to SNF for further rehab. I agree that pt's current level of care is not safe to return home with just [facility] and assist from his wife that works during the day. Pt continues to require extensive assist with ADLs and functional mobility to get OOB, standing and ambulating with RW. Pt with significantly decreased overall strength/endurance and balance. OT to continue to follow acutely  Follow Up Recommendations  SNF;Supervision/Assistance - 24 hour    Equipment Recommendations  Other (comment) (TBD at next venue of care)    Recommendations for Other Services      Precautions / Restrictions Precautions Precautions: Fall Restrictions Weight Bearing Restrictions: Yes RLE Weight Bearing: Weight bearing as tolerated       Mobility Bed Mobility Overal bed mobility: Needs Assistance       Supine to sit: Mod assist;HOB elevated     General bed mobility comments: Assist for LE movement towards EOB as well as for trunk elevation to full sitting position. Increased time and using pad required for pt to scoot fully to EOB.   Transfers Overall transfer level: Needs assistance Equipment used: Rolling walker (2 wheeled) Transfers: Sit to/from Stand Sit to Stand: Mod assist         General transfer comment: Pt was able to power-up to full standing position with mod assist for balance support. Increased time required for pt to extend hips and improve posture to full standing as well as to transfer to recliner    Balance Overall balance  assessment: Needs assistance Sitting-balance support: No upper extremity supported;Feet supported Sitting balance-Leahy Scale: Fair     Standing balance support: During functional activity Standing balance-Leahy Scale: Poor                     ADL Overall ADL's : Needs assistance/impaired             Lower Body Bathing: Sitting/lateral leans;Moderate assistance       Lower Body Dressing: Maximal assistance;Sitting/lateral leans;Sit to/from stand   Toilet Transfer: RW;BSC   Toileting- Water quality scientist and Hygiene: Total assistance;Sit to/from stand         General ADL Comments: pt moving very slowly and gaurded due to pain      Vision  wears glasses, no change from baseline                              Cognition   Behavior During Therapy: [facility] for tasks assessed/performed Overall Cognitive Status: Within Functional Limits for tasks assessed                       Extremity/Trunk Assessment   generalized weakness                        General Comments  pt very pleasant and cooperative    Pertinent Vitals/ Pain       Pain Assessment: 0-10 Pain Score: 6  Pain Location: R hip with mobility Pain Descriptors / Indicators: Operative site guarding;Aching;Sore Pain Intervention(s): Limited activity within patient's tolerance;Monitored during session;Premedicated before session;Repositioned  Home Living  lives at home with wife                                        Prior Functioning/Environment  independent           Frequency Min 2X/week     Progress Toward Goals  OT Goals(current goals can now be found in the care plan section)  Progress towards OT goals: OT to reassess next treatment     Plan Discharge plan needs to be updated                     End of Session Equipment Utilized During Treatment: Gait belt;Rolling walker;Other (comment) (BSC)   Activity Tolerance Patient  limited by pain   Patient Left in chair;with call bell/phone within reach             Time: [id number] OT Time Calculation (min): 33 min  Charges: OT General Charges $OT Visit: 1 Procedure OT Treatments $Self Care/Home Management : 8-22 mins $Therapeutic Activity: 8-22 mins  Britt Bottom 11/19/2015, 1:28 PM

## 2015-11-19 NOTE — Progress Notes (Addendum)
Subjective: Doing well.  Pain controlled.  Moving slow with PT.  States that if he needs rehab that he would consider [facility] in [place].     Objective: Vital signs in last 24 hours: Temp:  [97.6 F (36.4 C)-99 F (37.2 C)] 98.6 F (37 C) (07/18 0630) Pulse Rate:  [77-82] 81 (07/18 0630) Resp:  [18] 18 (07/18 0630) BP: (118-148)/(62-74) 125/71 mmHg (07/18 0630) SpO2:  [97 %-100 %] 98 % (07/18 0630)  Intake/Output from previous day: 07/17 0701 - 07/18 0700 In: 480 [P.O.:480] Out: 340 [Urine:240; Stool:100] Intake/Output this shift: Total I/O In: 240 [P.O.:240] Out: 100 [Urine:100]   Recent Labs  11/16/15 1303 11/17/15 0254 11/18/15 0340 11/19/15 0538  HGB 12.4* 10.8* 9.6* 8.9*    Recent Labs  11/18/15 0340 11/19/15 0538  WBC 9.6 9.7  RBC 3.16* 2.86*  HCT 29.3* 26.6*  PLT 129* 124*    Recent Labs  11/18/15 0340 11/19/15 0538  NA 135 136  K 4.1 4.1  CL 108 106  CO2 22 24  BUN 25* 26*  CREATININE 1.57* 1.63*  GLUCOSE 167* 132*  CALCIUM 8.5* 8.6*   No results for input(s): LABPT, INR in the last 72 hours.  Exam:  Pleasant wm, alert and oriented, NAD.  Wounds look good, staples intact.  No drainage or signs of infection.  bilat calves nontender.   Assessment/Plan: Since patient is making slow progress I advised that short snf placement may be in his best interest.  Discussed [facility] and [facility] [facility].  He will discuss with his wife but we will also see how he does with PT today.  Stable from orthopedic standpoint.     Madigan Rosensteel M 11/19/2015, 10:58 AM

## 2015-11-19 NOTE — Progress Notes (Signed)
Physical Therapy Treatment Patient Details Name: Tyler [name] MRN: [id number] DOB: 1929-03-12 Today's Date: 11/19/2015    History of Present Illness Pt is an 82 y/o male who presents s/p mechanical fall down the stairs at home, sustaining a R trochanteric hip fracture. Pt is now s/p R IM nailing on 11/17/15 and is WBAT.    PT Comments    Pt progressing slowly towards physical therapy goals. Continues to be limited by pain and was only able to ambulate 7 feet today. Discussed recommendation for SNF at d/c for continued therapy prior to return home and pt agreeable. Will continue to follow acutely.   Follow Up Recommendations  SNF;Supervision/Assistance - 24 hour     Equipment Recommendations  Rolling walker with 5" wheels;3in1 (PT)    Recommendations for Other Services       Precautions / Restrictions Precautions Precautions: Fall Restrictions Weight Bearing Restrictions: Yes RLE Weight Bearing: Weight bearing as tolerated    Mobility  Bed Mobility Overal bed mobility: Needs Assistance Bed Mobility: Supine to Sit     Supine to sit: Mod assist;HOB elevated     General bed mobility comments: Assist for LE movement towards EOB as well as for trunk elevation to full sitting position. Increased time required for pt to scoot fully to EOB.   Transfers Overall transfer level: Needs assistance Equipment used: Rolling walker (2 wheeled) Transfers: Sit to/from Stand Sit to Stand: Mod assist         General transfer comment: Pt was able to power-up to full standing position with mod assist for balance support. Increased time required for pt to extend hips and improve posture to full standing.   Ambulation/Gait Ambulation/Gait assistance: Min assist;+2 safety/equipment Ambulation Distance (Feet): 7 Feet Assistive device: Rolling walker (2 wheeled) Gait Pattern/deviations: Step-to pattern;Decreased stride length;Trunk flexed Gait velocity: Decreased Gait velocity  interpretation: Below normal speed for age/gender General Gait Details: Step-by-step cues for proper sequencing and advancement of RLE.    Stairs            Wheelchair Mobility    Modified Rankin (Stroke Patients Only)       Balance Overall balance assessment: Needs assistance Sitting-balance support: Feet supported;No upper extremity supported Sitting balance-Leahy Scale: Fair     Standing balance support: No upper extremity supported;During functional activity Standing balance-Leahy Scale: Poor Standing balance comment: Requires UE support at this time to maintain standing balance.                     Cognition Arousal/Alertness: Awake/alert Behavior During Therapy: WFL for tasks assessed/performed Overall Cognitive Status: Within Functional Limits for tasks assessed                      Exercises      General Comments        Pertinent Vitals/Pain Pain Assessment: Faces Pain Score: 6  Faces Pain Scale: Hurts whole lot Pain Location: R hip Pain Descriptors / Indicators: Operative site guarding;Sore Pain Intervention(s): Limited activity within patient's tolerance;Monitored during session;Premedicated before session;Repositioned    Home Living                      Prior Function            PT Goals (current goals can now be found in the care plan section) Acute Rehab PT Goals Patient Stated Goal: Return home at d/c  PT Goal Formulation: With patient Time For Goal Achievement: 11/25/15  Potential to Achieve Goals: Good Progress towards PT goals: Progressing toward goals    Frequency  Min 3X/week    PT Plan Discharge plan needs to be updated;Frequency needs to be updated    Co-evaluation             End of Session Equipment Utilized During Treatment: Gait belt Activity Tolerance: Patient limited by pain Patient left: in chair;with chair alarm set;with call bell/phone within reach     Time: 1335-1406 PT Time  Calculation (min) (ACUTE ONLY): 31 min  Charges:  $Gait Training: 23-37 mins                    G Codes:      Rolinda Roan 29-Nov-2015, 2:20 PM   Rolinda Roan, PT, DPT Acute Rehabilitation Services Pager: [phone]

## 2015-11-19 NOTE — Progress Notes (Signed)
Message given to pt from Mack Guise, [place] regarding SNF placement at [facility].

## 2015-11-19 NOTE — NC FL2 (Signed)
[place] LEVEL OF CARE SCREENING TOOL     IDENTIFICATION  Patient Name: Tyler [name] Birthdate: [birth year] Sex: male Admission Date (Current Location): 11/16/2015  [facility] and [place] Number:  Herbalist and Address:  [facility] [place], [place] [address] [place], [place] [address]      Provider Number: [id number]  Attending Physician Name and Address:  Geradine Girt, DO  Relative Name and Phone Number:  Rosell Fasolino - wife.  Phone number [phone]    Current Level of Care: Hospital Recommended Level of Care: [place] Prior Approval Number:    Date Approved/Denied:   PASRR Number: [id number] (Eff. 11/19/15)  Discharge Plan: SNF    Current Diagnoses: Patient Active Problem List   Diagnosis Date Noted  . Femur fracture, right ([place]) 11/16/2015  . History of nephrolithiasis 11/16/2015  . Pseudobulbar affect 06/07/2014  . Hypertension   . Cancer ([place])   . Kidney stones   . Memory loss   . Stroke ([place]) 02/02/2012  . Speech abnormality 01/28/2012  . Cerebral embolism with cerebral infarction ([place]) 01/28/2012  . CVA (cerebral infarction) 01/28/2012  . DM2 (diabetes mellitus, type 2) ([place]) 01/28/2012  . Hyperlipidemia 01/28/2012  . History of prostate cancer 01/28/2012    Orientation RESPIRATION BLADDER Height & Weight     Self, Time, Situation, Place  Normal Continent Weight: 172 lb (78.019 kg) Height:  5\' 10"  (177.8 cm)  BEHAVIORAL SYMPTOMS/MOOD NEUROLOGICAL BOWEL NUTRITION STATUS      Continent Diet (Carb modified)  AMBULATORY STATUS COMMUNICATION OF NEEDS Skin   Limited Assist (On 7/18 patient ambulated 7 feet with folling walker) Verbally Normal, Other (Comment) (Incision right hip)                       Personal Care Assistance Level of Assistance  Bathing, Feeding, Dressing Bathing Assistance: Maximum assistance Feeding assistance: Independent Dressing Assistance: Maximum assistance      Functional Limitations Info  Sight, Hearing, Speech Sight Info: Impaired (Wears glasses) Hearing Info: Adequate Speech Info: Adequate    SPECIAL CARE FACTORS FREQUENCY  PT (By licensed PT), OT (By licensed OT)     PT Frequency: Evaluated 7/17 and a minimum of therapy 3X per day recommended OT Frequency: Evaluated 7/17 and a minimum of therapy 2X per day recommended            Contractures Contractures Info: Not present    Additional Factors Info  Code Status, Allergies, Insulin Sliding Scale Code Status Info: Full code Allergies Info: Sulfa drugs   Insulin Sliding Scale Info: 0-9 Units insulin 3X per day with meals       Current Medications (11/19/2015):  This is the current hospital active medication list Current Facility-Administered Medications  Medication Dose Route Frequency Provider Last Rate Last Dose  . acetaminophen (TYLENOL) tablet 650 mg  650 mg Oral Q6H PRN Geradine Girt, DO   650 mg at 11/18/15 1702  . amLODipine (NORVASC) tablet 2.5 mg  2.5 mg Oral Daily Willia Craze, NP   2.5 mg at 11/19/15 0834  . atorvastatin (LIPITOR) tablet 20 mg  20 mg Oral q1800 Willia Craze, NP   20 mg at 11/18/15 1702  . bisacodyl (DULCOLAX) suppository 10 mg  10 mg Rectal Daily PRN Marybelle Killings, MD      . clopidogrel (PLAVIX) tablet 75 mg  75 mg Oral Q breakfast Jessica U Vann, DO   75 mg  at 11/19/15 0834  . docusate sodium (COLACE) capsule 100 mg  100 mg Oral BID Marybelle Killings, MD   100 mg at 11/19/15 0834  . enoxaparin (LOVENOX) injection 40 mg  40 mg Subcutaneous QHS Eudelia Bunch, RPH   40 mg at 11/18/15 2247  . feeding supplement (GLUCERNA SHAKE) (GLUCERNA SHAKE) liquid 237 mL  237 mL Oral Q1500 Jessica U Vann, DO   237 mL at 11/19/15 1421  . hydrALAZINE (APRESOLINE) injection 10 mg  10 mg Intravenous Q6H PRN Willia Craze, NP      . insulin aspart (novoLOG) injection 0-9 Units  0-9 Units Subcutaneous TID WC Willia Craze, NP   2 Units at 11/19/15 1249  .  losartan (COZAAR) tablet 50 mg  50 mg Oral QHS Willia Craze, NP   50 mg at 11/18/15 2247  . menthol-cetylpyridinium (CEPACOL) lozenge 3 mg  1 lozenge Oral PRN Marybelle Killings, MD       Or  . phenol (CHLORASEPTIC) mouth spray 1 spray  1 spray Mouth/Throat PRN Marybelle Killings, MD      . metoCLOPramide (REGLAN) tablet 5-10 mg  5-10 mg Oral Q8H PRN Marybelle Killings, MD       Or  . metoCLOPramide (REGLAN) injection 5-10 mg  5-10 mg Intravenous Q8H PRN Marybelle Killings, MD      . ondansetron [facility]) tablet 4 mg  4 mg Oral Q6H PRN Marybelle Killings, MD       Or  . ondansetron [facility]) injection 4 mg  4 mg Intravenous Q6H PRN Marybelle Killings, MD      . oxyCODONE (Oxy IR/ROXICODONE) immediate release tablet 5 mg  5 mg Oral Q4H PRN Geradine Girt, DO   5 mg at 11/19/15 1044  . pantoprazole (PROTONIX) EC tablet 40 mg  40 mg Oral Daily Willia Craze, NP   40 mg at 11/19/15 0834  . polyethylene glycol (MIRALAX / GLYCOLAX) packet 17 g  17 g Oral Daily PRN Willia Craze, NP         Discharge Medications: Please see discharge summary for a list of discharge medications.  Relevant Imaging Results:  Relevant Lab Results:   Additional Information ss#[id number]  Sable Feil, LCSW

## 2015-11-19 NOTE — Discharge Instructions (Signed)
Orthopedic d/c instructions  -ok to shower.  No tub soaking.  Do not apply any creams or ointments to incision.   -weight bear as tolerated with walker.

## 2015-11-20 DIAGNOSIS — Z87442 Personal history of urinary calculi: Secondary | ICD-10-CM | POA: Diagnosis not present

## 2015-11-20 DIAGNOSIS — I10 Essential (primary) hypertension: Secondary | ICD-10-CM | POA: Diagnosis not present

## 2015-11-20 DIAGNOSIS — E119 Type 2 diabetes mellitus without complications: Secondary | ICD-10-CM | POA: Diagnosis not present

## 2015-11-20 DIAGNOSIS — S72491D Other fracture of lower end of right femur, subsequent encounter for closed fracture with routine healing: Secondary | ICD-10-CM | POA: Diagnosis not present

## 2015-11-20 LAB — IRON AND TIBC
IRON: 29 ug/dL — AB (ref 45–182)
SATURATION RATIOS: 14 % — AB (ref 17.9–39.5)
TIBC: 204 ug/dL — AB (ref 250–450)
UIBC: 175 ug/dL

## 2015-11-20 LAB — CBC
HEMATOCRIT: 23.7 % — AB (ref 39.0–52.0)
HEMOGLOBIN: 8 g/dL — AB (ref 13.0–17.0)
MCH: 31.3 pg (ref 26.0–34.0)
MCHC: 33.8 g/dL (ref 30.0–36.0)
MCV: 92.6 fL (ref 78.0–100.0)
Platelets: 137 10*3/uL — ABNORMAL LOW (ref 150–400)
RBC: 2.56 MIL/uL — ABNORMAL LOW (ref 4.22–5.81)
RDW: 13.3 % (ref 11.5–15.5)
WBC: 9.1 10*3/uL (ref 4.0–10.5)

## 2015-11-20 LAB — RETICULOCYTES
RBC.: 2.78 MIL/uL — ABNORMAL LOW (ref 4.22–5.81)
RETIC CT PCT: 2.5 % (ref 0.4–3.1)
Retic Count, Absolute: 69.5 10*3/uL (ref 19.0–186.0)

## 2015-11-20 LAB — FERRITIN: Ferritin: 377 ng/mL — ABNORMAL HIGH (ref 24–336)

## 2015-11-20 LAB — GLUCOSE, CAPILLARY
GLUCOSE-CAPILLARY: 175 mg/dL — AB (ref 65–99)
GLUCOSE-CAPILLARY: 186 mg/dL — AB (ref 65–99)
GLUCOSE-CAPILLARY: 167 mg/dL — AB (ref 65–99)
Glucose-Capillary: 158 mg/dL — ABNORMAL HIGH (ref 65–99)

## 2015-11-20 LAB — FOLATE: Folate: 11.4 ng/mL (ref >5.9)

## 2015-11-20 LAB — VITAMIN B12: VITAMIN B 12: 580 pg/mL (ref 180–914)

## 2015-11-20 MED ORDER — HYDROXYZINE HCL 10 MG PO TABS
10.0000 mg | ORAL_TABLET | Freq: Three times a day (TID) | ORAL | Status: DC | PRN
  Administered 2015-11-20 – 2015-11-22 (×3): 10 mg via ORAL
  Filled 2015-11-20 (×6): qty 1

## 2015-11-20 MED ORDER — POLYETHYLENE GLYCOL 3350 17 G PO PACK: 17.0000 g | PACK | Freq: Every day | ORAL | Status: DC | PRN

## 2015-11-20 MED ORDER — DOCUSATE SODIUM 100 MG PO CAPS: 100.0000 mg | ORAL_CAPSULE | Freq: Two times a day (BID) | ORAL | Status: DC

## 2015-11-20 MED ORDER — GLUCERNA SHAKE PO LIQD: 237.0000 mL | Freq: Every day | ORAL | Status: DC

## 2015-11-20 NOTE — Care Management Important Message (Signed)
Important Message  Patient Details  Name: Tyler [name] MRN: [id number] Date of Birth: 1928-07-03   Medicare Important Message Given:  Yes    Loann Quill 11/20/2015, 10:09 AM

## 2015-11-20 NOTE — Care Management Note (Signed)
Case Management Note  Patient Details  Name: Tyler [name] MRN: [id number] Date of Birth: January 07, 1929  Subjective/Objective:    82 yr old gentleman s/p fall, underwent a right hip IM Nailing.                Action/Plan: Patient will need shortterm rehab at [facility]. Social worker is aware.   Expected Discharge Date:    11/21/15              Expected Discharge Plan:  Skilled Nursing Facility  In-House Referral:  Clinical Social Work  Discharge planning Services  CM Consult  Post Acute Care Choice:  NA Choice offered to:  NA  DME Arranged:  N/A DME Agency:  NA  HH Arranged:  NA HH Agency:  NA  Status of Service:  Completed, signed off  If discussed at [employer] of Stay Meetings, dates discussed:    Additional Comments:  Ninfa Meeker, RN 11/20/2015, 1:32 PM

## 2015-11-20 NOTE — Clinical Social Work Note (Signed)
Clinical Social Work Assessment  Patient Details  Name: Tyler [name] MRN: [id number] Date of Birth: 06/14/1928  Date of referral:  11/19/15               Reason for consult:  Facility Placement                Permission sought to share information with:  Family Supports Permission granted to share information::  Yes, Verbal Permission Granted  Name::     Tyler [name]   Agency::     Relationship::  Wife  Contact Information:  [phone]  Housing/Transportation Living arrangements for the past 2 months:  [place] of Information:  Patient, Spouse Patient Interpreter Needed:  None Criminal Activity/Legal Involvement Pertinent to Current Situation/Hospitalization:  No - Comment as needed Significant Relationships:  Spouse Lives with:  Spouse Do you feel safe going back to the place where you live?  Yes (Patient feels safe at home but is agreeable to ST rehab before returning home) Need for family participation in patient care:  Yes (Comment)  Care giving concerns:  Patient and wife agreeable to [facility] rehab before he returns home. Per wife, it is she and patient at home.  CSW advised by nurse case manager on 7/18 that patient agreeable to [facility] rehab and choices are [facility] and [facility].   Social Worker assessment / plan:  CSW initially talked with patient at the bedside. Tyler [name] was sitting up in the chair and is alert and oriented and was open to talking with CSW.  Tyler [name] agreeable to ST rehab and indicated that his wife's 1st choice is [facility]. CSW also spoke with patient's wife by phone regarding ST rehab and she is agreeable and confirmed that her 1st choice is [facility]. Patient and wife informed of facility response and that call will be made to inform them of their interest in going to [facility].   After talking with patient and wife, call made to [facility] and message left for admissions director Tyler [name] regarding patient  accepting bed at their facility.  Employment status:  Retired Forensic scientist:  Research scientist (life sciences)) PT Recommendations:  [place] / Referral to community resources:  [place] (SNF list not provided, patient and wife had faciity preferences)  Patient/Family's Response to care:  No concerns expressed regarding care during hospitalization.  Patient/Family's Understanding of and Emotional Response to Diagnosis, Current Treatment, and Prognosis:  Not discussed.  Emotional Assessment Appearance:  Appears stated age Attitude/Demeanor/Rapport:  Other (Appropriate) Affect (typically observed):  Pleasant, Appropriate Orientation:   Patient oriented X 4 Alcohol / Substance use:  Tobacco Use, Alcohol Use, Illicit Drugs (Patient quit smoking 05/04/68 and reports no alcohol or drug use) Psych involvement (Current and /or in the community):  No (Comment)  Discharge Needs  Concerns to be addressed:  Discharge Planning Concerns Readmission within the last 30 days:  No Current discharge risk:  None Barriers to Discharge:  No Barriers Identified   Sable Feil, LCSW 11/20/2015, 1:04 PM

## 2015-11-20 NOTE — Clinical Social Work Note (Addendum)
2:08pm- MD reports patient's Hb has dropped from 12 to 8.  Patient to remain inpatient today and possibly dc tomorrow pending medical improvement.  CSW updated RN, RNCM and SNF.  1[facility]pm[facility] at [facility].  Insurance authorization has been initiated with [employer] anticipated decision within the hour (by 2:30pm).  MD has been paged to complete dc summary if patient is medically stable for transfer to SNF today.  Disposition: [facility] once patient is medically stable  Nonnie Done, LCSW [phone]  [address] and [place] Licensed Clinical Social Worker

## 2015-11-20 NOTE — Discharge Summary (Addendum)
Physician Discharge Summary  DEANDREW SEANEY [id number] DOB: 07-Jun-1928 DOA: 11/16/2015  PCP: Dwan Bolt, MD  Admit date: 11/16/2015 Discharge date: 11/22/2015  Admitted From: Home Disposition:  snf  Recommendations for Outpatient Follow-up:  1. Follow up with PCP in 1-2 weeks 2. Please obtain BMP/CBC in one week 3. Please follow up with orthopedics as recommended.  4. Please follow up with urology as needed for renal stones.      Discharge Condition:stable.  CODE STATUS:FULL  Diet recommendation: Heart Healthy / Carb Modified   Brief/Interim Summary: NICHOLUS FERGUS is a 82 y.o. male with a Past Medical History of dm2, htn, prior cva years ago who presents with right femur frx sp fall down 3 steps on stairs. No anginal c/o, no orthopnea, no chest pain w/ exertion, no syncopal complaints, typically able to go up/down stairs w/o difficulty. He underwent surgical repair and PT recommending snf.   Discharge Diagnoses:  Active Problems:   DM2 (diabetes mellitus, type 2) ([place])   Hyperlipidemia   Hypertension   Femur fracture, right ([place])   History of nephrolithiasis  Right femur fracture.  -s/p surgery, intra medullary nail placement. Pain control  PT/OT eval recommending SNF.    DM 2.  CBG (last 3)   Recent Labs (last 2 labs)      Recent Labs  11/21/15 0808 11/21/15 1108 11/21/15 1631  GLUCAP 192* 238* 160*      Resume SSI and oral meds on discharge.  No change in meds.   History of CVA with mild residual slurring of speech.  -resume plavix  CKD, stage 3. Cr 1.67, slightly above was it was in 2013.  -Minimize use of nephrotoxic medications Repeat renal parameters in am are stable.   Hyperlipidemia.  -continue home statin  Hypertension.  Well controlled today.  -Continue home blood pressure medications   Acute blood loss Anemia from surgery: Baseline around 11, hemoglobin gradually dropped to 8 ,.  Get anemia panel,  showed low iron levels , and stoolf or occult blood ordered and pending. outpatient follow up.  Transfuse to keep hemoglobin greater than 8.  Ordered 2 units of blood, he reported some back pain after 5 minutes of transfusion, but patient reports that he had back pain before, similar to this, probably not related to blood transfusion. Received one unit of prbc and his hemoglobin improved to 9.2.  Monitor closely, outpatient.   Constipation: Resolved .    Renal stones; Pt apparently passed  6 stones earlier this am,  Currently asymptomatic.  Back pain much improved       Discharge Instructions      Discharge Instructions    Diet - low sodium heart healthy    Complete by:  As directed      Diet Carb Modified    Complete by:  As directed      Discharge instructions    Complete by:  As directed   PLEASE follow up with Dr Lorin Mercy as recommended.  Please check CBC tomorrow.     Discharge instructions    Complete by:  As directed   Please follow up with Dr Lorin Mercy as recommended.  Please follow hemoglobin in 1 day.     Weight bearing as tolerated    Complete by:  As directed             Medication List    STOP taking these medications        metoprolol tartrate 12.5 mg Tabs tablet  Commonly  known as:  LOPRESSOR      TAKE these medications        amLODipine 2.5 MG tablet  Commonly known as:  NORVASC  Take 2.5 mg by mouth daily.     atorvastatin 20 MG tablet  Commonly known as:  LIPITOR  Take 20 mg by mouth daily at 6 PM.     cholecalciferol 1000 units tablet  Commonly known as:  VITAMIN D  Take 1,000 Units by mouth every morning.     clopidogrel 75 MG tablet  Commonly known as:  PLAVIX  Take 1 tablet (75 mg total) by mouth daily with breakfast.     cyanocobalamin 1000 MCG tablet  Take 1,000 mcg by mouth daily.     docusate sodium 100 MG capsule  Commonly known as:  COLACE  Take 1 capsule (100 mg total) by mouth 2 (two) times daily.     feeding supplement  (GLUCERNA SHAKE) Liqd  Take 237 mLs by mouth daily at 3 pm.     ferrous sulfate 325 (65 FE) MG tablet  Take 1 tablet (325 mg total) by mouth 2 (two) times daily with a meal.     fish oil-omega-3 fatty acids 1000 MG capsule  Take 1 g by mouth 2 (two) times daily.     Flaxseed Oil 1000 MG Caps  Take 1,000 mg by mouth at bedtime.     glimepiride 2 MG tablet  Commonly known as:  AMARYL  Take 2 mg by mouth daily.     HYDROcodone-acetaminophen 7.5-325 MG tablet  Commonly known as:  NORCO  Take 1 tablet by mouth every 6 (six) hours as needed for moderate pain.     hydrOXYzine 10 MG tablet  Commonly known as:  ATARAX/VISTARIL  Take 1 tablet (10 mg total) by mouth 3 (three) times daily as needed for itching.     losartan 50 MG tablet  Commonly known as:  COZAAR  Take 50 mg by mouth at bedtime.     ONE TOUCH ULTRA TEST test strip  Generic drug:  glucose blood     onetouch ultrasoft lancets     pantoprazole 40 MG tablet  Commonly known as:  PROTONIX  Take 1 tablet (40 mg total) by mouth daily.     polyethylene glycol packet  Commonly known as:  MIRALAX / GLYCOLAX  Take 17 g by mouth daily as needed for mild constipation.     St Johns Wort 1000 MG Caps  Take 1,000 mg by mouth at bedtime.     vitamin C 500 MG tablet  Commonly known as:  ASCORBIC ACID  Take 500 mg by mouth at bedtime.       Follow-up Information    Schedule an appointment as soon as possible for a visit with Marybelle Killings, MD.   Specialty:  Orthopedic Surgery   Why:  needs return office visit 2 weeks postop.    Contact information:   [place] [address] [phone]       Follow up with [facility] .   Specialty:  [place] information:   [place] [phone]     Allergies  Allergen Reactions  . Sulfa Drugs Cross Reactors Anaphylaxis and Rash    Lungs fill with fluid     Consultations: Orthopedics.   Procedures/Studies: Chest Portable 1 View  11/16/2015  CLINICAL DATA:  Pre-op imaging for fractured hip surgery tomorrow. Hx of HTN,  diabetes, stroke, prostate cancer. EXAM: PORTABLE CHEST 1 VIEW COMPARISON:  01/29/2012 FINDINGS: The heart size and mediastinal contours are within normal limits. Both lungs are clear. The visualized skeletal structures are unremarkable. IMPRESSION: No active disease. Electronically Signed   By: Nolon Nations M.D.   On: 11/16/2015 17:13   Dg C-arm 1-60 Min  11/17/2015  CLINICAL DATA:  Hip fracture.  Intra medullary rod placement. EXAM: DG C-ARM 61-120 MIN; OPERATIVE RIGHT HIP WITH PELVIS COMPARISON:  Right hip radiographs 11/16/2015 FINDINGS: A right femoral inter medullary rod is in place. A dynamic hip screw is noted. Alignment is anatomic. A single interlocking screw is present distally. Surgical clips are again noted in the pelvis. IMPRESSION: Intra medullary right femoral rod with dynamic hip screw. Alignment is anatomic. No radiographic evidence for complication. Electronically Signed   By: San Morelle M.D.   On: 11/17/2015 10:04   Dg Hip Operative Unilat W Or W/o Pelvis Right  11/17/2015  CLINICAL DATA:  Hip fracture.  Intra medullary rod placement. EXAM: DG C-ARM 61-120 MIN; OPERATIVE RIGHT HIP WITH PELVIS COMPARISON:  Right hip radiographs 11/16/2015 FINDINGS: A right femoral inter medullary rod is in place. A dynamic hip screw is noted. Alignment is anatomic. A single interlocking screw is present distally. Surgical clips are again noted in the pelvis. IMPRESSION: Intra medullary right femoral rod with dynamic hip screw. Alignment is anatomic. No radiographic evidence for complication. Electronically Signed   By: San Morelle M.D.   On: 11/17/2015 10:04   Dg Hip Unilat With Pelvis 2-3 Views Right  11/16/2015  CLINICAL DATA:  Right-sided hip pain after fall on concrete. EXAM: DG HIP (WITH OR WITHOUT PELVIS)  2-3V RIGHT COMPARISON:  None. FINDINGS: Frontal pelvis with AP and frog-leg lateral views of the right hip show intertrochanteric right femoral neck fracture. Numerous surgical clips over the pelvis suggest prior lymph node dissection. Bones are demineralized. IMPRESSION: Intertrochanteric right proximal femur fracture. Electronically Signed   By: Misty Stanley M.D.   On: 11/16/2015 13:11       Subjective: Back pain improved.   Discharge Exam: Filed Vitals:   11/22/15 0400 11/22/15 0430  BP: 129/67 131/66  Pulse: 77 71  Temp: 98.6 F (37 C) 98.2 F (36.8 C)  Resp: 18 18   Filed Vitals:   11/22/15 0129 11/22/15 0145 11/22/15 0400 11/22/15 0430  BP: 132/50 139/54 129/67 131/66  Pulse: 74 77 77 71  Temp: 97.7 F (36.5 C) 97.6 F (36.4 C) 98.6 F (37 C) 98.2 F (36.8 C)  TempSrc: Oral Oral Oral Oral  Resp: 20 20 18 18   Height:      Weight:      SpO2: 96%  97% 98%    General: Pt is alert, awake, not in acute distress Cardiovascular: RRR, S1/S2 +, no rubs, no gallops Respiratory: CTA bilaterally, no wheezing, no rhonchi Abdominal: Soft, NT, ND, bowel sounds + Extremities: no edema, no cyanosis, painful rom of the left leg.     The results of significant diagnostics from this hospitalization (including imaging, microbiology, ancillary and laboratory) are listed below for reference.     Microbiology: Recent Results (from the past 240 hour(s))  Surgical pcr screen     Status: None   Collection Time: 11/16/15  9:39 PM  Result Value Ref Range Status   MRSA, PCR NEGATIVE NEGATIVE Final   Staphylococcus aureus NEGATIVE NEGATIVE Final    Comment:        The Xpert SA Assay (FDA approved  for NASAL specimens in patients over 72 years of age), is one component of a comprehensive surveillance program.  Test performance has been validated by [facility] for patients greater than or equal to 66 year old. It is not intended to diagnose infection nor to guide or monitor  treatment.      Labs: BNP (last 3 results) No results for input(s): BNP in the last 8760 hours. Basic Metabolic Panel:  Recent Labs Lab 11/16/15 1303 11/17/15 0254 11/18/15 0340 11/19/15 0538 11/22/15 0644  NA 139 137 135 136 138  K 4.2 3.9 4.1 4.1 4.4  CL 112* 109 108 106 108  CO2 19* 23 22 24 25   GLUCOSE 140* 126* 167* 132* 181*  BUN 23* 25* 25* 26* 36*  CREATININE 1.67* 1.51* 1.57* 1.63* 1.58*  CALCIUM 9.0 9.1 8.5* 8.6* 8.6*   Liver Function Tests: No results for input(s): AST, ALT, ALKPHOS, BILITOT, PROT, ALBUMIN in the last 168 hours. No results for input(s): LIPASE, AMYLASE in the last 168 hours. No results for input(s): AMMONIA in the last 168 hours. CBC:  Recent Labs Lab 11/16/15 1303  11/18/15 0340 11/19/15 0538 11/20/15 0511 11/21/15 0421 11/22/15 0644  WBC 13.5*  < > 9.6 9.7 9.1 7.8 7.2  NEUTROABS 11.8*  --   --   --   --   --   --   HGB 12.4*  < > 9.6* 8.9* 8.0* 8.0* 9.2*  HCT 36.0*  < > 29.3* 26.6* 23.7* 24.0* 26.9*  MCV 93.0  < > 92.7 93.0 92.6 93.4 86.8  PLT 143*  < > 129* 124* 137* 158 195  < > = values in this interval not displayed. Cardiac Enzymes: No results for input(s): CKTOTAL, CKMB, CKMBINDEX, TROPONINI in the last 168 hours. BNP: Invalid input(s): POCBNP CBG:  Recent Labs Lab 11/21/15 0808 11/21/15 1108 11/21/15 1631 11/21/15 2058 11/22/15 0653  GLUCAP 192* 238* 160* 204* 203*   D-Dimer No results for input(s): DDIMER in the last 72 hours. Hgb A1c No results for input(s): HGBA1C in the last 72 hours. Lipid Profile No results for input(s): CHOL, HDL, LDLCALC, TRIG, CHOLHDL, LDLDIRECT in the last 72 hours. Thyroid function studies No results for input(s): TSH, T4TOTAL, T3FREE, THYROIDAB in the last 72 hours.  Invalid input(s): FREET3 Anemia work up  Recent Labs  11/20/15 1837  VITAMINB12 580  FOLATE 11.4  FERRITIN 377*  TIBC 204*  IRON 29*  RETICCTPCT 2.5   Urinalysis    Component Value Date/Time   COLORURINE  YELLOW 11/16/2015 2138   APPEARANCEUR CLEAR 11/16/2015 2138   LABSPEC 1.020 11/16/2015 2138   PHURINE 5.0 11/16/2015 2138   GLUCOSEU NEGATIVE 11/16/2015 2138   HGBUR TRACE* 11/16/2015 2138   BILIRUBINUR NEGATIVE 11/16/2015 2138   KETONESUR NEGATIVE 11/16/2015 2138   PROTEINUR NEGATIVE 11/16/2015 2138   UROBILINOGEN 1.0 03/13/2008 1130   NITRITE NEGATIVE 11/16/2015 2138   LEUKOCYTESUR NEGATIVE 11/16/2015 2138   Sepsis Labs Invalid input(s): PROCALCITONIN,  WBC,  LACTICIDVEN Microbiology Recent Results (from the past 240 hour(s))  Surgical pcr screen     Status: None   Collection Time: 11/16/15  9:39 PM  Result Value Ref Range Status   MRSA, PCR NEGATIVE NEGATIVE Final   Staphylococcus aureus NEGATIVE NEGATIVE Final    Comment:        The Xpert SA Assay (FDA approved for NASAL specimens in patients over 78 years of age), is one component of a comprehensive surveillance program.  Test performance has been validated by  [facility] for patients greater than or equal to 45 year old. It is not intended to diagnose infection nor to guide or monitor treatment.      Time coordinating discharge: Over 30 minutes  SIGNED:   Hosie Poisson, MD  [facility] 11/22/2015, 11:13 AM Pager [phone]  If 7PM-7AM, please contact night-coverage [email or web address] Password [email or web address]

## 2015-11-20 NOTE — Progress Notes (Signed)
PROGRESS NOTE    Tyler [name]  [id number] DOB: 04-Aug-1928 DOA: 11/16/2015 PCP: Dwan Bolt, MD   Outpatient Specialists:     Brief Narrative:  Tyler [name] is a 82 y.o. male with a Past Medical History of dm2, htn, prior cva years ago who presents with right femur frx sp fall down 3 steps on stairs. No anginal c/o, no orthopnea, no chest pain w/ exertion, no syncopal complaints, typically able to go up/down stairs w/o difficulty. He underwent surgical repair and PT recommending snf.    Assessment & Plan:   Active Problems:   DM2 (diabetes mellitus, type 2) ([place])   Hyperlipidemia   Hypertension   Femur fracture, right ([place])   History of nephrolithiasis   Right femur fracture.  -s/p surgery, intra medullary nail placement. Pain control  PT/OT eval recommending SNF.    DM 2.  CBG (last 3)   Recent Labs  11/20/15 0623 11/20/15 1133 11/20/15 1640  GLUCAP 158* 175* 186*    Resume SSI and oral meds on discharge.  No change in meds.   History of CVA with mild residual slurring of speech.  -resume plavix  CKD, stage 3. Cr 1.67, slightly above was it was in 2013.  -Minimize use of nephrotoxic medications Repeat renal parameters inam  Hyperlipidemia.  -continue home statin  Hypertension.  Well controlled today.  -Continue home blood pressure medications   Acute blood loss Anemia from surgery: Baseline around 11, hemoglobin gradually dropped to 8 today.  Get anemia panel and stoolf or occult blood.  Transfuse to keep hemoglobin greater than 8.    DVT prophylaxis:  Lovenox   Code Status: Full Code  Family Communication: Sons/wife at bedside  Disposition Plan:  SNF if hemoglobin stays stable.    Consultants:   Orthopedics.   Procedures:  INTRAMEDULLARY (IM) NAIL FEMORAL (Right)      Subjective: Denies any new complaints.   Objective: Filed Vitals:   11/19/15 2000 11/20/15 0452 11/20/15 0753 11/20/15 1300  BP:  114/49 138/47 126/81 139/44  Pulse: 86 87 87 82  Temp: 99.1 F (37.3 C) 99.2 F (37.3 C)  98.6 F (37 C)  TempSrc: Oral Oral  Oral  Resp: 16 18  18   Height:      Weight:      SpO2: 97% 97%  94%    Intake/Output Summary (Last 24 hours) at 11/20/15 1823 Last data filed at 11/20/15 1300  Gross per 24 hour  Intake    600 ml  Output   1100 ml  Net   -500 ml   Filed Weights   11/16/15 1135  Weight: 78.019 kg (172 lb)    Examination:  General exam: Appears calm and comfortable  Respiratory system: Clear to auscultation. Respiratory effort normal. Cardiovascular system: S1 & S2 heard, RRR. [name], murmurs, rubs, gallops or clicks. No pedal edema. Gastrointestinal system: Abdomen is nondistended, soft and nontender. No organomegaly or masses felt. Normal bowel sounds heard. Central nervous system: Alert and oriented. No focal neurological deficits, mild right arm tremor EXTREMITIES: RIGHT LOWER EXTREMITY PAINFUL ROM .     Data Reviewed: I have personally reviewed following labs and imaging studies  CBC:  Recent Labs Lab 11/16/15 1303 11/17/15 0254 11/18/15 0340 11/19/15 0538 11/20/15 0511  WBC 13.5* 9.0 9.6 9.7 9.1  NEUTROABS 11.8*  --   --   --   --   HGB 12.4* 10.8* 9.6* 8.9* 8.0*  HCT 36.0* 32.2* 29.3* 26.6* 23.7*  MCV  93.0 92.8 92.7 93.0 92.6  PLT 143* 140* 129* 124* 0000000*   Basic Metabolic Panel:  Recent Labs Lab 11/16/15 1303 11/17/15 0254 11/18/15 0340 11/19/15 0538  NA 139 137 135 136  K 4.2 3.9 4.1 4.1  CL 112* 109 108 106  CO2 19* 23 22 24   GLUCOSE 140* 126* 167* 132*  BUN 23* 25* 25* 26*  CREATININE 1.67* 1.51* 1.57* 1.63*  CALCIUM 9.0 9.1 8.5* 8.6*   GFR: Estimated Creatinine Clearance: 34.8 mL/min (by C-G formula based on Cr of 1.63). Liver Function Tests: No results for input(s): AST, ALT, ALKPHOS, BILITOT, PROT, ALBUMIN in the last 168 hours. No results for input(s): LIPASE, AMYLASE in the last 168 hours. No results for input(s):  AMMONIA in the last 168 hours. Coagulation Profile: No results for input(s): INR, PROTIME in the last 168 hours. Cardiac Enzymes: No results for input(s): CKTOTAL, CKMB, CKMBINDEX, TROPONINI in the last 168 hours. BNP (last 3 results) No results for input(s): PROBNP in the last 8760 hours. HbA1C: No results for input(s): HGBA1C in the last 72 hours. CBG:  Recent Labs Lab 11/19/15 1617 11/19/15 2144 11/20/15 0623 11/20/15 1133 11/20/15 1640  GLUCAP 193* 214* 158* 175* 186*   Lipid Profile: No results for input(s): CHOL, HDL, LDLCALC, TRIG, CHOLHDL, LDLDIRECT in the last 72 hours. Thyroid Function Tests: No results for input(s): TSH, T4TOTAL, FREET4, T3FREE, THYROIDAB in the last 72 hours. Anemia Panel: No results for input(s): VITAMINB12, FOLATE, FERRITIN, TIBC, IRON, RETICCTPCT in the last 72 hours. Urine analysis:    Component Value Date/Time   COLORURINE YELLOW 11/16/2015 2138   APPEARANCEUR CLEAR 11/16/2015 2138   LABSPEC 1.020 11/16/2015 2138   PHURINE 5.0 11/16/2015 2138   GLUCOSEU NEGATIVE 11/16/2015 2138   HGBUR TRACE* 11/16/2015 2138   BILIRUBINUR NEGATIVE 11/16/2015 2138   KETONESUR NEGATIVE 11/16/2015 2138   PROTEINUR NEGATIVE 11/16/2015 2138   UROBILINOGEN 1.0 03/13/2008 1130   NITRITE NEGATIVE 11/16/2015 2138   LEUKOCYTESUR NEGATIVE 11/16/2015 2138      Recent Results (from the past 240 hour(s))  Surgical pcr screen     Status: None   Collection Time: 11/16/15  9:39 PM  Result Value Ref Range Status   MRSA, PCR NEGATIVE NEGATIVE Final   Staphylococcus aureus NEGATIVE NEGATIVE Final    Comment:        The Xpert SA Assay (FDA approved for NASAL specimens in patients over 39 years of age), is one component of a comprehensive surveillance program.  Test performance has been validated by [facility] for patients greater than or equal to 40 year old. It is not intended to diagnose infection nor to guide or monitor treatment.        Anti-infectives    Start     Dose/Rate Route Frequency Ordered Stop   11/17/15 0728  ceFAZolin (ANCEF) 2-4 GM/100ML-% IVPB    Comments:  [name]   : cabinet override      11/17/15 [id number] 11/17/15 0745       Radiology Studies: No results found.      Scheduled Meds: . amLODipine  2.5 mg Oral Daily  . atorvastatin  20 mg Oral q1800  . clopidogrel  75 mg Oral Q breakfast  . docusate sodium  100 mg Oral BID  . enoxaparin (LOVENOX) injection  40 mg Subcutaneous QHS  . feeding supplement (GLUCERNA SHAKE)  237 mL Oral Q1500  . insulin aspart  0-9 Units Subcutaneous TID WC  . losartan  50 mg Oral QHS  .  pantoprazole  40 mg Oral Daily   Continuous Infusions:     LOS: 4 days    Time spent: 25 min    Susane Bey, MD [facility] Pager [phone]  If 7PM-7AM, please contact night-coverage [email or web address] Password [facility] 11/20/2015, 6:23 PM

## 2015-11-20 NOTE — Progress Notes (Signed)
Subjective: 3 Days Post-Op Procedure(s) (LRB): INTRAMEDULLARY (IM) NAIL FEMORAL (Right) Patient reports pain as moderate and severe.  Mild unless he moves his right hip or bears weight.   Objective: Vital signs in last 24 hours: Temp:  [98.3 F (36.8 C)-99.2 F (37.3 C)] 99.2 F (37.3 C) (07/19 0452) Pulse Rate:  [86-89] 87 (07/19 0452) Resp:  [16-18] 18 (07/19 0452) BP: (114-138)/(47-49) 138/47 mmHg (07/19 0452) SpO2:  [97 %-98 %] 97 % (07/19 0452)  Intake/Output from previous day: 07/18 0701 - 07/19 0700 In: 820 [P.O.:820] Out: 1125 [Urine:1125] Intake/Output this shift:     Recent Labs  11/18/15 0340 11/19/15 0538 11/20/15 0511  HGB 9.6* 8.9* 8.0*    Recent Labs  11/19/15 0538 11/20/15 0511  WBC 9.7 9.1  RBC 2.86* 2.56*  HCT 26.6* 23.7*  PLT 124* 137*    Recent Labs  11/18/15 0340 11/19/15 0538  NA 135 136  K 4.1 4.1  CL 108 106  CO2 22 24  BUN 25* 26*  CREATININE 1.57* 1.63*  GLUCOSE 167* 132*  CALCIUM 8.5* 8.6*   No results for input(s): LABPT, INR in the last 72 hours.  Neurologically intact  Mild thigh swelling.   Assessment/Plan: 3 Days Post-Op Procedure(s) (LRB): INTRAMEDULLARY (IM) NAIL FEMORAL (Right) Up with therapy .  Acute blood loss anemia with  fx and with Lovenox given for DVT prevention.   Kaja Jackowski C 11/20/2015, 7:43 AM

## 2015-11-20 NOTE — Clinical Social Work Placement (Addendum)
   CLINICAL SOCIAL WORK PLACEMENT  NOTE  Date:  11/20/2015  Patient Details  Name: Tyler [name] MRN: [id number] Date of Birth: 03/08/1929  Clinical Social Work is seeking post-discharge placement for this patient at the [place] level of care (*CSW will initial, date and re-position this form in  chart as items are completed):  No (CSW was given patient/wife's facility preferences on 7/18)   Patient/family provided with [place] Work Department's list of facilities offering this level of care within the geographic area requested by the patient (or if unable, by the patient's family).  Yes   Patient/family informed of their freedom to choose among providers that offer the needed level of care, that participate in Medicare, Medicaid or managed care program needed by the patient, have an available bed and are willing to accept the patient.  Yes   Patient/family informed of [facility]'s ownership interest in [facility] and [facility], as well as of the fact that they are under no obligation to receive care at these facilities.  PASRR submitted to EDS on 11/19/15     PASRR number received on 11/19/15     Existing PASRR number confirmed on       FL2 transmitted to all facilities in geographic area requested by pt/family on 11/19/15     FL2 transmitted to all facilities within larger geographic area on       Patient informed that his/her managed care company has contracts with or will negotiate with certain facilities, including the following:        Yes   Patient/family informed of bed offers received.  Patient chooses bed at [facility]     Physician recommends and patient chooses bed at      Patient to be transferred to   on  .  Patient to be transferred to facility by       Patient family notified on   of transfer.  Name of family member notified:        PHYSICIAN       Additional Comment:  11/20/15 - Insurance  authorization initiated for [employer]   _______________________________________________ Sable Feil, LCSW 11/20/2015, 1:17 PM

## 2015-11-21 DIAGNOSIS — E1122 Type 2 diabetes mellitus with diabetic chronic kidney disease: Secondary | ICD-10-CM | POA: Diagnosis not present

## 2015-11-21 DIAGNOSIS — N183 Chronic kidney disease, stage 3 (moderate): Secondary | ICD-10-CM | POA: Diagnosis not present

## 2015-11-21 DIAGNOSIS — I129 Hypertensive chronic kidney disease with stage 1 through stage 4 chronic kidney disease, or unspecified chronic kidney disease: Secondary | ICD-10-CM | POA: Diagnosis not present

## 2015-11-21 DIAGNOSIS — S72141A Displaced intertrochanteric fracture of right femur, initial encounter for closed fracture: Secondary | ICD-10-CM | POA: Diagnosis not present

## 2015-11-21 DIAGNOSIS — E119 Type 2 diabetes mellitus without complications: Secondary | ICD-10-CM | POA: Diagnosis not present

## 2015-11-21 DIAGNOSIS — Z79899 Other long term (current) drug therapy: Secondary | ICD-10-CM | POA: Diagnosis not present

## 2015-11-21 DIAGNOSIS — D62 Acute posthemorrhagic anemia: Secondary | ICD-10-CM | POA: Diagnosis not present

## 2015-11-21 DIAGNOSIS — Z87891 Personal history of nicotine dependence: Secondary | ICD-10-CM | POA: Diagnosis not present

## 2015-11-21 DIAGNOSIS — I10 Essential (primary) hypertension: Secondary | ICD-10-CM | POA: Diagnosis not present

## 2015-11-21 DIAGNOSIS — K5900 Constipation, unspecified: Secondary | ICD-10-CM | POA: Diagnosis not present

## 2015-11-21 DIAGNOSIS — Z8546 Personal history of malignant neoplasm of prostate: Secondary | ICD-10-CM | POA: Diagnosis not present

## 2015-11-21 DIAGNOSIS — Z87442 Personal history of urinary calculi: Secondary | ICD-10-CM | POA: Diagnosis not present

## 2015-11-21 DIAGNOSIS — E785 Hyperlipidemia, unspecified: Secondary | ICD-10-CM | POA: Diagnosis not present

## 2015-11-21 DIAGNOSIS — N200 Calculus of kidney: Secondary | ICD-10-CM | POA: Diagnosis not present

## 2015-11-21 DIAGNOSIS — S72491D Other fracture of lower end of right femur, subsequent encounter for closed fracture with routine healing: Secondary | ICD-10-CM | POA: Diagnosis not present

## 2015-11-21 DIAGNOSIS — Z7902 Long term (current) use of antithrombotics/antiplatelets: Secondary | ICD-10-CM | POA: Diagnosis not present

## 2015-11-21 LAB — PREPARE RBC (CROSSMATCH)

## 2015-11-21 LAB — GLUCOSE, CAPILLARY
GLUCOSE-CAPILLARY: 192 mg/dL — AB (ref 65–99)
GLUCOSE-CAPILLARY: 238 mg/dL — AB (ref 65–99)
GLUCOSE-CAPILLARY: 160 mg/dL — AB (ref 65–99)
GLUCOSE-CAPILLARY: 204 mg/dL — AB (ref 65–99)

## 2015-11-21 LAB — CBC
HEMATOCRIT: 24 % — AB (ref 39.0–52.0)
HEMOGLOBIN: 8 g/dL — AB (ref 13.0–17.0)
MCH: 31.1 pg (ref 26.0–34.0)
MCHC: 33.3 g/dL (ref 30.0–36.0)
MCV: 93.4 fL (ref 78.0–100.0)
Platelets: 158 10*3/uL (ref 150–400)
RBC: 2.57 MIL/uL — AB (ref 4.22–5.81)
RDW: 13.4 % (ref 11.5–15.5)
WBC: 7.8 10*3/uL (ref 4.0–10.5)

## 2015-11-21 MED ORDER — SODIUM CHLORIDE 0.9 % IV SOLN
Freq: Once | INTRAVENOUS | Status: AC
  Administered 2015-11-21: 14:00:00 via INTRAVENOUS

## 2015-11-21 MED ORDER — FERROUS SULFATE 325 (65 FE) MG PO TABS
325.0000 mg | ORAL_TABLET | Freq: Two times a day (BID) | ORAL | Status: DC
  Administered 2015-11-22: 325 mg via ORAL
  Filled 2015-11-21: qty 1

## 2015-11-21 NOTE — Progress Notes (Signed)
PROGRESS NOTE    Tyler [name]  [id number] DOB: [birth year] DOA: 11/16/2015 PCP: Dwan Bolt, MD       Brief Narrative:  Tyler [name] is a 82 y.o. male with a Past Medical History of dm2, htn, prior cva years ago who presents with right femur frx sp fall down 3 steps on stairs. No anginal c/o, no orthopnea, no chest pain w/ exertion, no syncopal complaints, typically able to go up/down stairs w/o difficulty. He underwent surgical repair and PT recommending snf.    Assessment & Plan:   Active Problems:   DM2 (diabetes mellitus, type 2) ([place])   Hyperlipidemia   Hypertension   Femur fracture, right ([place])   History of nephrolithiasis   Right femur fracture.  -s/p surgery, intra medullary nail placement. Pain control  PT/OT eval recommending SNF.    DM 2.  CBG (last 3)   Recent Labs  11/21/15 0808 11/21/15 1108 11/21/15 1631  GLUCAP 192* 238* 160*    Resume SSI and oral meds on discharge.  No change in meds.   History of CVA with mild residual slurring of speech.  -resume plavix  CKD, stage 3. Cr 1.67, slightly above was it was in 2013.  -Minimize use of nephrotoxic medications Repeat renal parameters in am  Hyperlipidemia.  -continue home statin  Hypertension.  Well controlled today.  -Continue home blood pressure medications   Acute blood loss Anemia from surgery: Baseline around 11, hemoglobin gradually dropped to 8 today.  Get anemia panel, showed low iron levels , and stoolf or occult blood ordered and pending.  Transfuse to keep hemoglobin greater than 8.  Ordered 2 units of blood, he reported some back pain after 5 minutes of transfusion, but patient reports that he had back pain before, similar to this.  Sub optimal pain control after 4 hours, discussed with the patient and wife and RN, suggested restarting the prbc transfusion.  Monitor closely,   DVT prophylaxis:  Lovenox   Code Status: Full Code  Family  Communication: Sons/wife at bedside  Disposition Plan:  SNF in am.    Consultants:   Orthopedics.   Procedures:  INTRAMEDULLARY (IM) NAIL FEMORAL (Right)      Subjective: Some back pain   Objective: Filed Vitals:   11/21/15 0531 11/21/15 1310 11/21/15 1350 11/21/15 1426  BP: 151/51 131/43 151/52 133/46  Pulse: 77 88 81   Temp: 98.3 F (36.8 C) 98.1 F (36.7 C) 98.1 F (36.7 C)   TempSrc: Oral Oral Oral   Resp: 17 16 15    Height:      Weight:      SpO2: 99% 97%      Intake/Output Summary (Last 24 hours) at 11/21/15 1903 Last data filed at 11/21/15 1340  Gross per 24 hour  Intake    595 ml  Output    325 ml  Net    270 ml   Filed Weights   11/16/15 1135  Weight: 78.019 kg (172 lb)    Examination:  General exam: Appears calm and comfortable ,  Respiratory system: Clear to auscultation. Respiratory effort normal. Cardiovascular system: S1 & S2 heard, RRR. [name], murmurs, rubs, gallops or clicks. No pedal edema. Gastrointestinal system: Abdomen is nondistended, soft and nontender. No organomegaly or masses felt. Normal bowel sounds heard. Central nervous system: Alert and oriented. No focal neurological deficits, mild right arm tremor EXTREMITIES: RIGHT LOWER EXTREMITY PAINFUL ROM .     Data Reviewed: I have personally reviewed following  labs and imaging studies  CBC:  Recent Labs Lab 11/16/15 1303 11/17/15 0254 11/18/15 0340 11/19/15 0538 11/20/15 0511 11/21/15 0421  WBC 13.5* 9.0 9.6 9.7 9.1 7.8  NEUTROABS 11.8*  --   --   --   --   --   HGB 12.4* 10.8* 9.6* 8.9* 8.0* 8.0*  HCT 36.0* 32.2* 29.3* 26.6* 23.7* 24.0*  MCV 93.0 92.8 92.7 93.0 92.6 93.4  PLT 143* 140* 129* 124* 137* 0000000   Basic Metabolic Panel:  Recent Labs Lab 11/16/15 1303 11/17/15 0254 11/18/15 0340 11/19/15 0538  NA 139 137 135 136  K 4.2 3.9 4.1 4.1  CL 112* 109 108 106  CO2 19* 23 22 24   GLUCOSE 140* 126* 167* 132*  BUN 23* 25* 25* 26*  CREATININE 1.67* 1.51*  1.57* 1.63*  CALCIUM 9.0 9.1 8.5* 8.6*   GFR: Estimated Creatinine Clearance: 34.8 mL/min (by C-G formula based on Cr of 1.63). Liver Function Tests: No results for input(s): AST, ALT, ALKPHOS, BILITOT, PROT, ALBUMIN in the last 168 hours. No results for input(s): LIPASE, AMYLASE in the last 168 hours. No results for input(s): AMMONIA in the last 168 hours. Coagulation Profile: No results for input(s): INR, PROTIME in the last 168 hours. Cardiac Enzymes: No results for input(s): CKTOTAL, CKMB, CKMBINDEX, TROPONINI in the last 168 hours. BNP (last 3 results) No results for input(s): PROBNP in the last 8760 hours. HbA1C: No results for input(s): HGBA1C in the last 72 hours. CBG:  Recent Labs Lab 11/20/15 1640 11/20/15 2056 11/21/15 0808 11/21/15 1108 11/21/15 1631  GLUCAP 186* 167* 192* 238* 160*   Lipid Profile: No results for input(s): CHOL, HDL, LDLCALC, TRIG, CHOLHDL, LDLDIRECT in the last 72 hours. Thyroid Function Tests: No results for input(s): TSH, T4TOTAL, FREET4, T3FREE, THYROIDAB in the last 72 hours. Anemia Panel:  Recent Labs  11/20/15 1837  VITAMINB12 580  FOLATE 11.4  FERRITIN 377*  TIBC 204*  IRON 29*  RETICCTPCT 2.5   Urine analysis:    Component Value Date/Time   COLORURINE YELLOW 11/16/2015 2138   APPEARANCEUR CLEAR 11/16/2015 2138   LABSPEC 1.020 11/16/2015 2138   PHURINE 5.0 11/16/2015 2138   GLUCOSEU NEGATIVE 11/16/2015 2138   HGBUR TRACE* 11/16/2015 2138   BILIRUBINUR NEGATIVE 11/16/2015 2138   KETONESUR NEGATIVE 11/16/2015 2138   PROTEINUR NEGATIVE 11/16/2015 2138   UROBILINOGEN 1.0 03/13/2008 1130   NITRITE NEGATIVE 11/16/2015 2138   LEUKOCYTESUR NEGATIVE 11/16/2015 2138      Recent Results (from the past 240 hour(s))  Surgical pcr screen     Status: None   Collection Time: 11/16/15  9:39 PM  Result Value Ref Range Status   MRSA, PCR NEGATIVE NEGATIVE Final   Staphylococcus aureus NEGATIVE NEGATIVE Final    Comment:          The Xpert SA Assay (FDA approved for NASAL specimens in patients over 37 years of age), is one component of a comprehensive surveillance program.  Test performance has been validated by [facility] for patients greater than or equal to 78 year old. It is not intended to diagnose infection nor to guide or monitor treatment.       Anti-infectives    Start     Dose/Rate Route Frequency Ordered Stop   11/17/15 0728  ceFAZolin (ANCEF) 2-4 GM/100ML-% IVPB    Comments:  [name]   : cabinet override      11/17/15 [id number] 11/17/15 0745       Radiology Studies: No results  found.      Scheduled Meds: . amLODipine  2.5 mg Oral Daily  . atorvastatin  20 mg Oral q1800  . clopidogrel  75 mg Oral Q breakfast  . docusate sodium  100 mg Oral BID  . enoxaparin (LOVENOX) injection  40 mg Subcutaneous QHS  . feeding supplement (GLUCERNA SHAKE)  237 mL Oral Q1500  . insulin aspart  0-9 Units Subcutaneous TID WC  . losartan  50 mg Oral QHS  . pantoprazole  40 mg Oral Daily   Continuous Infusions:     LOS: 5 days    Time spent: 25 min    Xzaiver Vayda, MD [facility] Pager [phone]  If 7PM-7AM, please contact night-coverage [email or web address] Password [email or web address] 11/21/2015, 7:03 PM

## 2015-11-21 NOTE — Progress Notes (Signed)
Sent blood product back to blood bank. EKG was done on patient. Patient's vital signs are stable.  Still stating he has back pain.  Dr. Karleen Hampshire said to hold off on giving blood.

## 2015-11-21 NOTE — Progress Notes (Signed)
Dr. Karleen Hampshire paged and notified to address pt hemoglobin which remains at 8.0. MD note stated transfusion but there was no orders received. Oncoming RN notified and MD notified to re-address. Delia Heady RN

## 2015-11-21 NOTE — Progress Notes (Signed)
Talked to Dr. Karleen Hampshire stated does not think patient had a reaction to the blood.  States to go ahead and give the unit.

## 2015-11-21 NOTE — Progress Notes (Signed)
Pt complained of back pain within first 5 minutes of blood transfusion. Transfusion was stopped and doctor was notified. Pt given tylenol to treat pain and transfusion will be held to see if pain is relieved by medication.

## 2015-11-21 NOTE — Progress Notes (Signed)
Will relay to night nurse to start unit of blood for patient.

## 2015-11-21 NOTE — Progress Notes (Signed)
Physical Therapy Treatment Patient Details Name: Tyler [name] MRN: [id number] DOB: Sep 14, 1928 Today's Date: 11/21/2015    History of Present Illness Pt is an 82 y/o male who presents s/p mechanical fall down the stairs at home, sustaining a R trochanteric hip fracture. Pt is now s/p R IM nailing on 11/17/15 and is WBAT.    PT Comments    Pt progressing slowly towards physical therapy goals. Was able to improve ambulation distance this session, however required 1 seated rest break to achieve full 15 feet. Therapeutic exercise was deferred due to pain and fatigue after gait training.   Follow Up Recommendations  SNF;Supervision/Assistance - 24 hour     Equipment Recommendations  Rolling walker with 5" wheels;3in1 (PT)    Recommendations for Other Services       Precautions / Restrictions Precautions Precautions: Fall Restrictions Weight Bearing Restrictions: Yes RLE Weight Bearing: Weight bearing as tolerated    Mobility  Bed Mobility Overal bed mobility: Needs Assistance Bed Mobility: Supine to Sit     Supine to sit: Min assist;HOB elevated     General bed mobility comments: Assist for RLE movement towards EOB. Pt was able to complete with increased time and also self-assisting RLE around at times.   Transfers Overall transfer level: Needs assistance Equipment used: Rolling walker (2 wheeled)   Sit to Stand: Min assist         General transfer comment: VC's for hand placement on seated surface for safety. Hands-on assist required for power-up to full standing position.  Ambulation/Gait Ambulation/Gait assistance: Min assist;+2 safety/equipment Ambulation Distance (Feet): 15 Feet (10' + 5') Assistive device: Rolling walker (2 wheeled) Gait Pattern/deviations: Step-to pattern;Decreased stride length;Trunk flexed Gait velocity: Decreased Gait velocity interpretation: Below normal speed for age/gender General Gait Details: Step-by-step cues for proper sequencing  and advancement of RLE.    Stairs            Wheelchair Mobility    Modified Rankin (Stroke Patients Only)       Balance Overall balance assessment: Needs assistance Sitting-balance support: Feet supported;No upper extremity supported Sitting balance-Leahy Scale: Fair     Standing balance support: No upper extremity supported;During functional activity Standing balance-Leahy Scale: Poor Standing balance comment: Requires UE support at this time to maintain standing balance.                     Cognition Arousal/Alertness: Awake/alert Behavior During Therapy: WFL for tasks assessed/performed Overall Cognitive Status: Within Functional Limits for tasks assessed                      Exercises      General Comments        Pertinent Vitals/Pain Pain Assessment: Faces Faces Pain Scale: Hurts even more Pain Location: R hip during ambulation Pain Descriptors / Indicators: Operative site guarding;Sore Pain Intervention(s): Limited activity within patient's tolerance;Monitored during session;Repositioned    Home Living                      Prior Function            PT Goals (current goals can now be found in the care plan section) Acute Rehab PT Goals Patient Stated Goal: Home after SNF PT Goal Formulation: With patient Time For Goal Achievement: 11/25/15 Potential to Achieve Goals: Good Progress towards PT goals: Progressing toward goals    Frequency  Min 3X/week    PT Plan Discharge plan needs  to be updated;Frequency needs to be updated    Co-evaluation             End of Session Equipment Utilized During Treatment: Gait belt Activity Tolerance: Patient limited by pain Patient left: in chair;with chair alarm set;with call bell/phone within reach     Time: 0813-0837 PT Time Calculation (min) (ACUTE ONLY): 24 min  Charges:  $Gait Training: 23-37 mins                    G Codes:      Rolinda Roan 2015/12/05, 2:23  PM   Rolinda Roan, PT, DPT Acute Rehabilitation Services Pager: [phone]

## 2015-11-21 NOTE — Progress Notes (Signed)
Subjective: Doing ok.  Pain controlled right hip.  States that he feels somewhat lightheaded when he is standing.  Was supposed to transfer to snf yesterday but Hgb was low.  hospitalist mentioned transfusion prbc's in their note but patient stated that it wasn't done.     Objective: Vital signs in last 24 hours: Temp:  [98.1 F (36.7 C)-98.6 F (37 C)] 98.3 F (36.8 C) (07/20 0531) Pulse Rate:  [66-83] 77 (07/20 0531) Resp:  [16-18] 17 (07/20 0531) BP: (115-151)/(44-97) 151/51 mmHg (07/20 0531) SpO2:  [94 %-99 %] 99 % (07/20 0531)  Intake/Output from previous day: 07/19 0701 - 07/20 0700 In: 960 [P.O.:960] Out: 725 [Urine:725] Intake/Output this shift:     Recent Labs  11/19/15 0538 11/20/15 0511 11/21/15 0421  HGB 8.9* 8.0* 8.0*    Recent Labs  11/20/15 0511 11/20/15 1837 11/21/15 0421  WBC 9.1  --  7.8  RBC 2.56* 2.78* 2.57*  HCT 23.7*  --  24.0*  PLT 137*  --  158    Recent Labs  11/19/15 0538  NA 136  K 4.1  CL 106  CO2 24  BUN 26*  CREATININE 1.63*  GLUCOSE 132*  CALCIUM 8.6*   No results for input(s): LABPT, INR in the last 72 hours.  Exam: alert and oriented.  Wounds look good.  Staples intact.  No drainage or signs of infection.  bilat calves nontender.  NVI.   Assessment/Plan: RN to contact hospitalist about transfusion.  Stable from ortho standpoint to transfer to snf when medically stable.    Tyler [name] M 11/21/2015, 9:18 AM

## 2015-11-21 NOTE — Progress Notes (Signed)
Blood has been paused because patient stated he started having back pain.  Transfusion has been paused, Dr. Karleen Hampshire has been notified.  She stated to give Tylenol and reassess pain. Will assess pain at 1430.

## 2015-11-22 ENCOUNTER — Encounter
Admission: RE | Admit: 2015-11-22 | Discharge: 2015-11-22 | Disposition: A | Discharge status: Home / Self Care | Payer: PPO | Source: Ambulatory Visit | Attending: Internal Medicine | Admitting: Internal Medicine

## 2015-11-22 DIAGNOSIS — R2689 Other abnormalities of gait and mobility: Secondary | ICD-10-CM | POA: Diagnosis not present

## 2015-11-22 DIAGNOSIS — I110 Hypertensive heart disease with heart failure: Secondary | ICD-10-CM | POA: Diagnosis not present

## 2015-11-22 DIAGNOSIS — E1122 Type 2 diabetes mellitus with diabetic chronic kidney disease: Secondary | ICD-10-CM | POA: Diagnosis not present

## 2015-11-22 DIAGNOSIS — I69328 Other speech and language deficits following cerebral infarction: Secondary | ICD-10-CM | POA: Diagnosis not present

## 2015-11-22 DIAGNOSIS — N200 Calculus of kidney: Secondary | ICD-10-CM | POA: Diagnosis not present

## 2015-11-22 DIAGNOSIS — N183 Chronic kidney disease, stage 3 (moderate): Secondary | ICD-10-CM | POA: Diagnosis not present

## 2015-11-22 DIAGNOSIS — S72009A Fracture of unspecified part of neck of unspecified femur, initial encounter for closed fracture: Secondary | ICD-10-CM | POA: Diagnosis not present

## 2015-11-22 DIAGNOSIS — D649 Anemia, unspecified: Secondary | ICD-10-CM | POA: Diagnosis not present

## 2015-11-22 DIAGNOSIS — Z7984 Long term (current) use of oral hypoglycemic drugs: Secondary | ICD-10-CM | POA: Diagnosis not present

## 2015-11-22 DIAGNOSIS — S72141D Displaced intertrochanteric fracture of right femur, subsequent encounter for closed fracture with routine healing: Secondary | ICD-10-CM | POA: Diagnosis not present

## 2015-11-22 DIAGNOSIS — M6281 Muscle weakness (generalized): Secondary | ICD-10-CM | POA: Diagnosis not present

## 2015-11-22 DIAGNOSIS — Z9181 History of falling: Secondary | ICD-10-CM | POA: Diagnosis not present

## 2015-11-22 DIAGNOSIS — E785 Hyperlipidemia, unspecified: Secondary | ICD-10-CM | POA: Diagnosis not present

## 2015-11-22 DIAGNOSIS — S72491D Other fracture of lower end of right femur, subsequent encounter for closed fracture with routine healing: Secondary | ICD-10-CM | POA: Diagnosis not present

## 2015-11-22 DIAGNOSIS — I10 Essential (primary) hypertension: Secondary | ICD-10-CM | POA: Diagnosis not present

## 2015-11-22 DIAGNOSIS — E119 Type 2 diabetes mellitus without complications: Secondary | ICD-10-CM | POA: Diagnosis not present

## 2015-11-22 DIAGNOSIS — Z87442 Personal history of urinary calculi: Secondary | ICD-10-CM | POA: Diagnosis not present

## 2015-11-22 DIAGNOSIS — R41841 Cognitive communication deficit: Secondary | ICD-10-CM | POA: Diagnosis not present

## 2015-11-22 LAB — GLUCOSE, CAPILLARY
GLUCOSE-CAPILLARY: 173 mg/dL — AB (ref 65–99)
Glucose-Capillary: 203 mg/dL — ABNORMAL HIGH (ref 65–99)

## 2015-11-22 LAB — CBC
HEMATOCRIT: 26.9 % — AB (ref 39.0–52.0)
HEMOGLOBIN: 9.2 g/dL — AB (ref 13.0–17.0)
MCH: 29.7 pg (ref 26.0–34.0)
MCHC: 34.2 g/dL (ref 30.0–36.0)
MCV: 86.8 fL (ref 78.0–100.0)
Platelets: 195 10*3/uL (ref 150–400)
RBC: 3.1 MIL/uL — ABNORMAL LOW (ref 4.22–5.81)
RDW: 18.8 % — AB (ref 11.5–15.5)
WBC: 7.2 10*3/uL (ref 4.0–10.5)

## 2015-11-22 LAB — BASIC METABOLIC PANEL
ANION GAP: 5 (ref 5–15)
BUN: 36 mg/dL — ABNORMAL HIGH (ref 6–20)
CALCIUM: 8.6 mg/dL — AB (ref 8.9–10.3)
CHLORIDE: 108 mmol/L (ref 101–111)
CO2: 25 mmol/L (ref 22–32)
Creatinine, Ser: 1.58 mg/dL — ABNORMAL HIGH (ref 0.61–1.24)
GFR calc non Af Amer: 38 mL/min — ABNORMAL LOW (ref >60)
GFR, EST AFRICAN AMERICAN: 45 mL/min — AB (ref >60)
GLUCOSE: 181 mg/dL — AB (ref 65–99)
Potassium: 4.4 mmol/L (ref 3.5–5.1)
Sodium: 138 mmol/L (ref 135–145)

## 2015-11-22 LAB — TRANSFUSION REACTION
DAT C3: NEGATIVE
Post RXN DAT IgG: NEGATIVE

## 2015-11-22 MED ORDER — BISACODYL 10 MG RE SUPP
10.0000 mg | Freq: Once | RECTAL | Status: AC
Start: 2015-11-22 — End: 2015-11-22
  Administered 2015-11-22: 10 mg via RECTAL
  Filled 2015-11-22: qty 1

## 2015-11-22 MED ORDER — MAGNESIUM CITRATE PO SOLN: 0.5000 | Freq: Once | ORAL | Status: DC

## 2015-11-22 MED ORDER — HYDROXYZINE HCL 10 MG PO TABS: 10.0000 mg | ORAL_TABLET | Freq: Three times a day (TID) | ORAL | Status: DC | PRN

## 2015-11-22 MED ORDER — BISACODYL 10 MG RE SUPP: 10.0000 mg | Freq: Once | RECTAL | Status: DC

## 2015-11-22 MED ORDER — FERROUS SULFATE 325 (65 FE) MG PO TABS: 325.0000 mg | ORAL_TABLET | Freq: Two times a day (BID) | ORAL | Status: DC

## 2015-11-22 NOTE — Progress Notes (Signed)
Inpatient Diabetes Program Recommendations  AACE/ADA: New Consensus Statement on Inpatient Glycemic Control (2015)  Target Ranges:  Prepandial:   less than 140 mg/dL      Peak postprandial:   less than 180 mg/dL (1-2 hours)      Critically ill patients:  140 - 180 mg/dL   Results for [name], [name] (MRN [id number]) as of 11/22/2015 09:01  Ref. Range 11/21/2015 08:08 11/21/2015 11:08 11/21/2015 16:31 11/21/2015 20:58 11/22/2015 06:53  Glucose-Capillary Latest Ref Range: 65-99 mg/dL 192 (H) 238 (H) 160 (H) 204 (H) 203 (H)   Review of Glycemic Control  Diabetes history: DM2 Outpatient Diabetes medications: Amaryl 2 mg daily Current orders for Inpatient glycemic control: Novolog 0-9 units TID with meals  Inpatient Diabetes Program Recommendations: Insulin - Basal: Please consider ordering low dose basal insulin. Recommend starting with Lantus 5 units daily.  Thanks, Barnie Alderman, RN, MSN, CDE Diabetes Coordinator Inpatient Diabetes Program [phone] (Team Pager from [facility] to [place]) [phone] ([facility] office) [phone] [facility] office) [phone] [facility] office)

## 2015-11-22 NOTE — Progress Notes (Signed)
Dr. Karleen Hampshire verbal order to hold blood transfusion until H&H lab results.

## 2015-11-22 NOTE — Progress Notes (Addendum)
Tyler [name] to be D/C'd SNF per MD order.  Discussed with the patient and all questions fully answered.  VSS. Staples in place and dressing intact. MD aware of rash on back. IV catheter discontinued intact. Site without signs and symptoms of complications. Dressing and pressure applied.  11:27 AM Attempted to give report and voicemail left  Attempted report again.    Gregary Signs, RN report given to at [facility]   Patient escorted via [place].   L'ESPERANCE, Tyler [name] C 11/22/2015 11:26 AM

## 2015-11-22 NOTE — Care Management Important Message (Signed)
Important Message  Patient Details  Name: Tyler [name] MRN: [id number] Date of Birth: Jul 19, 1928   Medicare Important Message Given:  Yes    Loann Quill 11/22/2015, 8:27 AM

## 2015-11-22 NOTE — Progress Notes (Signed)
Patient had a large bowel movement after suppository. Patient also passes small brown stones while urinating. MD made aware.

## 2015-11-22 NOTE — Clinical Social Work Note (Signed)
Patient will discharge today per MD order. Patient will discharge to: [facility] SNF RN to call report prior to transportation to: [phone] Transportation: [facility]  CSW sent discharge summary to SNF for review.    Nonnie Done, LCSW [phone]  5N1-9, 2S 15-16 and Psychiatric Service Line  Licensed Clinical Social Worker

## 2015-11-22 NOTE — Progress Notes (Signed)
Subjective: Patient doing well this AM.  Hip pain controlled.  No bowel movement x a few days.  Positive flatus.  No abd pain.    Objective: Vital signs in last 24 hours: Temp:  [97.6 F (36.4 C)-98.6 F (37 C)] 98.2 F (36.8 C) (07/21 0430) Pulse Rate:  [71-88] 71 (07/21 0430) Resp:  [15-184] 18 (07/21 0430) BP: (129-151)/(43-67) 131/66 mmHg (07/21 0430) SpO2:  [96 %-98 %] 98 % (07/21 0430)  Intake/Output from previous day: 07/20 0701 - 07/21 0700 In: 897 [P.O.:240; Blood:657] Out: 550 [Urine:550] Intake/Output this shift: Total I/O In: 240 [P.O.:240] Out: 100 [Urine:100]   Recent Labs  11/20/15 0511 11/21/15 0421 11/22/15 0644  HGB 8.0* 8.0* 9.2*    Recent Labs  11/21/15 0421 11/22/15 0644  WBC 7.8 7.2  RBC 2.57* 3.10*  HCT 24.0* 26.9*  PLT 158 195    Recent Labs  11/22/15 0644  NA 138  K 4.4  CL 108  CO2 25  BUN 36*  CREATININE 1.58*  GLUCOSE 181*  CALCIUM 8.6*   No results for input(s): LABPT, INR in the last 72 hours.  Exam:  Wounds look good.  Staples intact.  No drainage or signs of infection.  bilat calves nontender NVI.    Assessment/Plan: Ordered mag citrate and dulcolax supp.  Plan for transfer to snf today.  F/u with Dr Lorin Mercy 2 weeks postop.    OWENS,JAMES M 11/22/2015, 12:04 PM

## 2015-11-22 NOTE — Progress Notes (Signed)
Shift notes: Patient is resting in the bed, alert, awake, no acute distress noted, no complains of pain, received 1 unit of RPBCs without reaction, VS WNL, foams dry and intact to right hip incisions, no drainage noted, second unit is not started, MD was paged twice for a new order but no new order seen or call back. Will continue to monitor.

## 2015-11-22 NOTE — Clinical Social Work Placement (Signed)
   CLINICAL SOCIAL WORK PLACEMENT  NOTE  Date:  11/22/2015  Patient Details  Name: Tyler [name] MRN: [id number] Date of Birth: 13-Feb-1929  Clinical Social Work is seeking post-discharge placement for this patient at the [place] level of care (*CSW will initial, date and re-position this form in  chart as items are completed):  No (CSW was given patient/wife's facility preferences on 7/18)   Patient/family provided with [place] Work Department's list of facilities offering this level of care within the geographic area requested by the patient (or if unable, by the patient's family).  Yes   Patient/family informed of their freedom to choose among providers that offer the needed level of care, that participate in Medicare, Medicaid or managed care program needed by the patient, have an available bed and are willing to accept the patient.  Yes   Patient/family informed of [facility]'s ownership interest in [facility] and [facility], as well as of the fact that they are under no obligation to receive care at these facilities.  PASRR submitted to EDS on 11/19/15     PASRR number received on 11/19/15     Existing PASRR number confirmed on       FL2 transmitted to all facilities in geographic area requested by pt/family on 11/19/15     FL2 transmitted to all facilities within larger geographic area on       Patient informed that his/her managed care company has contracts with or will negotiate with certain facilities, including the following:        Yes   Patient/family informed of bed offers received.  Patient chooses bed at [facility]     Physician recommends and patient chooses bed at      Patient to be transferred to [facility] on 11/22/15.  Patient to be transferred to facility by [facility]     Patient family notified on 11/22/15 of transfer.  Name of family member notified:  patient is alert and oriented and agreeable to  update family. Phone at bedside     PHYSICIAN Please sign FL2     Additional Comment:    _______________________________________________ Dulcy Fanny, LCSW 11/22/2015, 11:13 AM

## 2015-11-22 NOTE — Consult Note (Signed)
   [facility] CM Inpatient Consult   11/22/2015  Tyler [name] Nov 22, 1928 [id number]  Patient screened for potential [facility] services. Patient is eligible for [facility]. Patient 's benefit is [employer] plan.  Electronic medical record reveals patient's discharge plan is for a skilled nursing facility. Spoke with inpatient RNCM who feels the patient could benefit from services after his rehab and for returning home.  Went to speak with patient and the ambulance service was getting ready for his discharge to the skilled facility.  [facility] Management will follow to alert for potential needs.  For questions please contact:   Natividad Brood, RN BSN [facility] Liaison  [phone] business mobile phone Toll free office [phone]

## 2015-11-23 LAB — TYPE AND SCREEN
ABO/RH(D): B POS
ANTIBODY SCREEN: NEGATIVE
UNIT DIVISION: 0
Unit division: 0

## 2015-11-25 DIAGNOSIS — G464 Cerebellar stroke syndrome: Secondary | ICD-10-CM | POA: Diagnosis not present

## 2015-11-25 DIAGNOSIS — M810 Age-related osteoporosis without current pathological fracture: Secondary | ICD-10-CM | POA: Diagnosis not present

## 2015-11-25 DIAGNOSIS — R21 Rash and other nonspecific skin eruption: Secondary | ICD-10-CM | POA: Diagnosis not present

## 2015-11-25 DIAGNOSIS — K5900 Constipation, unspecified: Secondary | ICD-10-CM | POA: Diagnosis not present

## 2015-11-25 DIAGNOSIS — N183 Chronic kidney disease, stage 3 (moderate): Secondary | ICD-10-CM | POA: Diagnosis not present

## 2015-11-25 DIAGNOSIS — I10 Essential (primary) hypertension: Secondary | ICD-10-CM | POA: Diagnosis not present

## 2015-11-26 ENCOUNTER — Other Ambulatory Visit
Admission: RE | Admit: 2015-11-26 | Discharge: 2015-11-26 | Disposition: A | Discharge status: Home / Self Care | Payer: PPO | Source: Ambulatory Visit | Attending: Gerontology | Admitting: Gerontology

## 2015-11-26 DIAGNOSIS — D649 Anemia, unspecified: Secondary | ICD-10-CM | POA: Diagnosis not present

## 2015-11-26 LAB — CBC WITH DIFFERENTIAL/PLATELET
BASOS ABS: 0 10*3/uL (ref 0–0.1)
Basophils Relative: 0 %
Eosinophils Absolute: 0.4 10*3/uL (ref 0–0.7)
Eosinophils Relative: 4 %
HCT: 30.4 % — ABNORMAL LOW (ref 40.0–52.0)
HEMOGLOBIN: 10.2 g/dL — AB (ref 13.0–18.0)
LYMPHS ABS: 1.5 10*3/uL (ref 1.0–3.6)
LYMPHS PCT: 16 %
MCH: 30.2 pg (ref 26.0–34.0)
MCHC: 33.6 g/dL (ref 32.0–36.0)
MCV: 89.9 fL (ref 80.0–100.0)
Monocytes Absolute: 0.8 10*3/uL (ref 0.2–1.0)
Monocytes Relative: 9 %
NEUTROS PCT: 71 %
Neutro Abs: 6.9 10*3/uL — ABNORMAL HIGH (ref 1.4–6.5)
Platelets: 328 10*3/uL (ref 150–440)
RBC: 3.38 MIL/uL — AB (ref 4.40–5.90)
RDW: 18.8 % — ABNORMAL HIGH (ref 11.5–14.5)
WBC: 9.7 10*3/uL (ref 3.8–10.6)

## 2015-11-27 LAB — COMPREHENSIVE METABOLIC PANEL
ALT: 67 U/L — ABNORMAL HIGH (ref 17–63)
AST: 25 U/L (ref 15–41)
Albumin: 3.2 g/dL — ABNORMAL LOW (ref 3.5–5.0)
Alkaline Phosphatase: 127 U/L — ABNORMAL HIGH (ref 38–126)
Anion gap: 6 (ref 5–15)
BILIRUBIN TOTAL: 1.1 mg/dL (ref 0.3–1.2)
BUN: 38 mg/dL — AB (ref 6–20)
CHLORIDE: 105 mmol/L (ref 101–111)
CO2: 25 mmol/L (ref 22–32)
Calcium: 8.9 mg/dL (ref 8.9–10.3)
Creatinine, Ser: 1.34 mg/dL — ABNORMAL HIGH (ref 0.61–1.24)
GFR, EST AFRICAN AMERICAN: 54 mL/min — AB (ref >60)
GFR, EST NON AFRICAN AMERICAN: 47 mL/min — AB (ref >60)
Glucose, Bld: 129 mg/dL — ABNORMAL HIGH (ref 65–99)
POTASSIUM: 4.4 mmol/L (ref 3.5–5.1)
Sodium: 136 mmol/L (ref 135–145)
TOTAL PROTEIN: 6 g/dL — AB (ref 6.5–8.1)

## 2015-12-03 ENCOUNTER — Encounter
Admission: RE | Admit: 2015-12-03 | Discharge: 2015-12-03 | Disposition: A | Discharge status: Home / Self Care | Payer: PPO | Source: Ambulatory Visit | Attending: Internal Medicine | Admitting: Internal Medicine

## 2015-12-03 DIAGNOSIS — Z7984 Long term (current) use of oral hypoglycemic drugs: Secondary | ICD-10-CM | POA: Diagnosis not present

## 2015-12-03 DIAGNOSIS — S72141D Displaced intertrochanteric fracture of right femur, subsequent encounter for closed fracture with routine healing: Secondary | ICD-10-CM | POA: Diagnosis not present

## 2015-12-03 DIAGNOSIS — E119 Type 2 diabetes mellitus without complications: Secondary | ICD-10-CM | POA: Insufficient documentation

## 2015-12-03 DIAGNOSIS — S72142D Displaced intertrochanteric fracture of left femur, subsequent encounter for closed fracture with routine healing: Secondary | ICD-10-CM | POA: Diagnosis not present

## 2015-12-03 DIAGNOSIS — D649 Anemia, unspecified: Secondary | ICD-10-CM | POA: Diagnosis not present

## 2015-12-03 DIAGNOSIS — I69328 Other speech and language deficits following cerebral infarction: Secondary | ICD-10-CM | POA: Diagnosis not present

## 2015-12-03 DIAGNOSIS — I110 Hypertensive heart disease with heart failure: Secondary | ICD-10-CM | POA: Diagnosis not present

## 2015-12-03 DIAGNOSIS — R41841 Cognitive communication deficit: Secondary | ICD-10-CM | POA: Diagnosis not present

## 2015-12-03 DIAGNOSIS — R2689 Other abnormalities of gait and mobility: Secondary | ICD-10-CM | POA: Diagnosis not present

## 2015-12-03 DIAGNOSIS — Z9181 History of falling: Secondary | ICD-10-CM | POA: Diagnosis not present

## 2015-12-03 DIAGNOSIS — N183 Chronic kidney disease, stage 3 (moderate): Secondary | ICD-10-CM | POA: Diagnosis not present

## 2015-12-03 DIAGNOSIS — E785 Hyperlipidemia, unspecified: Secondary | ICD-10-CM | POA: Diagnosis not present

## 2015-12-03 DIAGNOSIS — N200 Calculus of kidney: Secondary | ICD-10-CM | POA: Diagnosis not present

## 2015-12-03 DIAGNOSIS — M6281 Muscle weakness (generalized): Secondary | ICD-10-CM | POA: Diagnosis not present

## 2015-12-03 DIAGNOSIS — E1122 Type 2 diabetes mellitus with diabetic chronic kidney disease: Secondary | ICD-10-CM | POA: Diagnosis not present

## 2015-12-03 LAB — GLUCOSE, CAPILLARY
GLUCOSE-CAPILLARY: 199 mg/dL — AB (ref 65–99)
GLUCOSE-CAPILLARY: 169 mg/dL — AB (ref 65–99)
Glucose-Capillary: 142 mg/dL — ABNORMAL HIGH (ref 65–99)

## 2015-12-04 DIAGNOSIS — E119 Type 2 diabetes mellitus without complications: Secondary | ICD-10-CM | POA: Diagnosis not present

## 2015-12-05 DIAGNOSIS — E119 Type 2 diabetes mellitus without complications: Secondary | ICD-10-CM | POA: Diagnosis not present

## 2015-12-05 LAB — GLUCOSE, CAPILLARY
GLUCOSE-CAPILLARY: 128 mg/dL — AB (ref 65–99)
GLUCOSE-CAPILLARY: 180 mg/dL — AB (ref 65–99)
GLUCOSE-CAPILLARY: 180 mg/dL — AB (ref 65–99)
Glucose-Capillary: 200 mg/dL — ABNORMAL HIGH (ref 65–99)

## 2015-12-06 DIAGNOSIS — E119 Type 2 diabetes mellitus without complications: Secondary | ICD-10-CM | POA: Diagnosis not present

## 2015-12-06 LAB — GLUCOSE, CAPILLARY
GLUCOSE-CAPILLARY: 116 mg/dL — AB (ref 65–99)
GLUCOSE-CAPILLARY: 134 mg/dL — AB (ref 65–99)
GLUCOSE-CAPILLARY: 219 mg/dL — AB (ref 65–99)
Glucose-Capillary: 186 mg/dL — ABNORMAL HIGH (ref 65–99)

## 2015-12-07 LAB — GLUCOSE, CAPILLARY
GLUCOSE-CAPILLARY: 106 mg/dL — AB (ref 65–99)
GLUCOSE-CAPILLARY: 137 mg/dL — AB (ref 65–99)
GLUCOSE-CAPILLARY: 153 mg/dL — AB (ref 65–99)
GLUCOSE-CAPILLARY: 149 mg/dL — AB (ref 65–99)
Glucose-Capillary: 106 mg/dL — ABNORMAL HIGH (ref 65–99)
Glucose-Capillary: 149 mg/dL — ABNORMAL HIGH (ref 65–99)
Glucose-Capillary: 227 mg/dL — ABNORMAL HIGH (ref 65–99)
Glucose-Capillary: 95 mg/dL (ref 65–99)

## 2015-12-08 DIAGNOSIS — E119 Type 2 diabetes mellitus without complications: Secondary | ICD-10-CM | POA: Diagnosis not present

## 2015-12-08 LAB — GLUCOSE, CAPILLARY
GLUCOSE-CAPILLARY: 160 mg/dL — AB (ref 65–99)
GLUCOSE-CAPILLARY: 140 mg/dL — AB (ref 65–99)
GLUCOSE-CAPILLARY: 182 mg/dL — AB (ref 65–99)

## 2015-12-09 DIAGNOSIS — E119 Type 2 diabetes mellitus without complications: Secondary | ICD-10-CM | POA: Diagnosis not present

## 2015-12-09 LAB — GLUCOSE, CAPILLARY
GLUCOSE-CAPILLARY: 122 mg/dL — AB (ref 65–99)
GLUCOSE-CAPILLARY: 143 mg/dL — AB (ref 65–99)
GLUCOSE-CAPILLARY: 161 mg/dL — AB (ref 65–99)
Glucose-Capillary: 86 mg/dL (ref 65–99)
Glucose-Capillary: 223 mg/dL — ABNORMAL HIGH (ref 65–99)

## 2015-12-10 DIAGNOSIS — E119 Type 2 diabetes mellitus without complications: Secondary | ICD-10-CM | POA: Diagnosis not present

## 2015-12-11 LAB — GLUCOSE, CAPILLARY
GLUCOSE-CAPILLARY: 186 mg/dL — AB (ref 65–99)
GLUCOSE-CAPILLARY: 121 mg/dL — AB (ref 65–99)
GLUCOSE-CAPILLARY: 132 mg/dL — AB (ref 65–99)
Glucose-Capillary: 93 mg/dL (ref 65–99)
Glucose-Capillary: 161 mg/dL — ABNORMAL HIGH (ref 65–99)
Glucose-Capillary: 94 mg/dL (ref 65–99)
Glucose-Capillary: 125 mg/dL — ABNORMAL HIGH (ref 65–99)
Glucose-Capillary: 205 mg/dL — ABNORMAL HIGH (ref 65–99)

## 2015-12-12 ENCOUNTER — Non-Acute Institutional Stay (SKILLED_NURSING_FACILITY): Payer: PPO | Admitting: Gerontology

## 2015-12-12 DIAGNOSIS — S72491D Other fracture of lower end of right femur, subsequent encounter for closed fracture with routine healing: Secondary | ICD-10-CM | POA: Diagnosis not present

## 2015-12-12 DIAGNOSIS — E119 Type 2 diabetes mellitus without complications: Secondary | ICD-10-CM | POA: Diagnosis not present

## 2015-12-12 DIAGNOSIS — Z9181 History of falling: Secondary | ICD-10-CM | POA: Diagnosis not present

## 2015-12-12 DIAGNOSIS — S72141D Displaced intertrochanteric fracture of right femur, subsequent encounter for closed fracture with routine healing: Secondary | ICD-10-CM | POA: Diagnosis not present

## 2015-12-12 NOTE — Progress Notes (Signed)
Location:      Place of Service:  SNF (31)  Provider: Toni Arthurs, NP-C  PCP: Dwan Bolt, MD Patient Care Team: Anda Kraft, MD as PCP - General (Endocrinology)  Extended Emergency Contact Information Primary Emergency Contact: Ziemann,Barbara Address: [address] [place]          [place], [place] [address] [place] Phone: [phone] Work Phone: [phone] Mobile Phone: [phone] Relation: Spouse  Code Status: Full Goals of care:  Advanced Directive information Advanced Directives 11/16/2015  Does patient have an advance directive? No  Type of Advance Directive -  Copy of advanced directive(s) in chart? -  Would patient like information on creating an advanced directive? -     Allergies  Allergen Reactions  . Sulfa Drugs Cross Reactors Anaphylaxis and Rash    Lungs fill with fluid    Chief Complaint  Patient presents with  . Discharge Note    HPI:  82 y.o. male at the facility for rehab following a Right femur Fracture and repair. Pt did well during stay, minimal complications. Very appreciative of the staff and the care he received. He progressed well with physical therapy. Dressing CDI. Ready for discharge.     Past Medical History:  Diagnosis Date  . Cancer [facility])    Prostate  . Diabetes mellitus without complication ([place])   . Hypertension   . Kidney stones   . Stroke [facility])     Past Surgical History:  Procedure Laterality Date  . CARDIAC CATHETERIZATION    . FEMUR IM NAIL Right 11/17/2015   Procedure: INTRAMEDULLARY (IM) NAIL FEMORAL;  Surgeon: Marybelle Killings, MD;  Location: [facility];  Service: Orthopedics;  Laterality: Right;  . PROSTATE SURGERY        reports that he quit smoking about 47 years ago. He has never used smokeless tobacco. He reports that he does not drink alcohol or use drugs. Social History   Social History  . Marital status: Married    Spouse name: Marland Mcalpine  . Number of children: 2  . Years of  education: 12   Occupational History  . retired Retired    Orthoptist   Social History Main Topics  . Smoking status: Former Smoker    Quit date: 05/04/1968  . Smokeless tobacco: Never Used  . Alcohol use No  . Drug use: No  . Sexual activity: Not on file   Other Topics Concern  . Not on file   Social History Narrative   Patient lives at home with his wife [name]) retired. Patient has a high school education. Two Children . No caffeine. Right handed.   Functional Status Survey:    Allergies  Allergen Reactions  . Sulfa Drugs Cross Reactors Anaphylaxis and Rash    Lungs fill with fluid    Pertinent  Health Maintenance Due  Topic Date Due  . FOOT EXAM  01/01/1939  . OPHTHALMOLOGY EXAM  01/01/1939  . PNA vac Low Risk Adult (1 of 2 - PCV13) 01/02/1996  . INFLUENZA VACCINE  12/03/2015  . HEMOGLOBIN A1C  05/18/2016    Medications:   Medication List       Accurate as of 12/12/15 12:39 PM. Always use your most recent med list.          amLODipine 2.5 MG tablet Commonly known as:  NORVASC Take 2.5 mg by mouth daily.   atorvastatin 20 MG tablet Commonly known as:  LIPITOR Take 20 mg by mouth daily at 6 PM.  cholecalciferol 1000 units tablet Commonly known as:  VITAMIN D Take 1,000 Units by mouth every morning.   clopidogrel 75 MG tablet Commonly known as:  PLAVIX Take 1 tablet (75 mg total) by mouth daily with breakfast.   cyanocobalamin 1000 MCG tablet Take 1,000 mcg by mouth daily.   docusate sodium 100 MG capsule Commonly known as:  COLACE Take 1 capsule (100 mg total) by mouth 2 (two) times daily.   feeding supplement (GLUCERNA SHAKE) Liqd Take 237 mLs by mouth daily at 3 pm.   ferrous sulfate 325 (65 FE) MG tablet Take 1 tablet (325 mg total) by mouth 2 (two) times daily with a meal.   fish oil-omega-3 fatty acids 1000 MG capsule Take 1 g by mouth 2 (two) times daily.   Flaxseed Oil 1000 MG Caps Take 1,000 mg by mouth at bedtime.   glimepiride  2 MG tablet Commonly known as:  AMARYL Take 2 mg by mouth daily.   HYDROcodone-acetaminophen 7.5-325 MG tablet Commonly known as:  NORCO Take 1 tablet by mouth every 6 (six) hours as needed for moderate pain.   hydrOXYzine 10 MG tablet Commonly known as:  ATARAX/VISTARIL Take 1 tablet (10 mg total) by mouth 3 (three) times daily as needed for itching.   losartan 50 MG tablet Commonly known as:  COZAAR Take 50 mg by mouth at bedtime.   ONE TOUCH ULTRA TEST test strip Generic drug:  glucose blood   onetouch ultrasoft lancets   pantoprazole 40 MG tablet Commonly known as:  PROTONIX Take 1 tablet (40 mg total) by mouth daily.   polyethylene glycol packet Commonly known as:  MIRALAX / GLYCOLAX Take 17 g by mouth daily as needed for mild constipation.   St Johns Wort 1000 MG Caps Take 1,000 mg by mouth at bedtime.   vitamin C 500 MG tablet Commonly known as:  ASCORBIC ACID Take 500 mg by mouth at bedtime.       Review of Systems  Constitutional: Positive for appetite change. Negative for activity change, chills, diaphoresis and fever.  HENT: Negative for congestion, sneezing, sore throat, trouble swallowing and voice change.   Respiratory: Negative for apnea, cough, choking, chest tightness, shortness of breath and wheezing.   Cardiovascular: Negative for chest pain, palpitations and leg swelling.  Gastrointestinal: Negative for abdominal distention, abdominal pain, constipation, diarrhea and nausea.  Genitourinary: Negative for difficulty urinating, dysuria, frequency and urgency.  Musculoskeletal: Positive for arthralgias (typical arthritis). Negative for back pain, gait problem and myalgias.  Skin: Negative for color change, pallor, rash and wound.  Neurological: Negative for dizziness, tremors, syncope, speech difficulty, weakness, numbness and headaches.  Psychiatric/Behavioral: Negative for agitation and behavioral problems.  All other systems reviewed and are  negative.   Vitals:   12/12/15 1236  BP: (!) 146/55  Pulse: 66  Resp: 18  Temp: 98 F (36.7 C)  SpO2: 97%   There is no height or weight on file to calculate BMI. Physical Exam  Constitutional: He is oriented to person, place, and time. Vital signs are normal. He appears well-developed and well-nourished. He is active and cooperative. He does not appear ill. No distress.  HENT:  Head: Normocephalic and atraumatic.  Mouth/Throat: Uvula is midline, oropharynx is clear and moist and mucous membranes are normal. Mucous membranes are not pale, not dry and not cyanotic.  Eyes: Conjunctivae, EOM and lids are normal. Pupils are equal, round, and reactive to light.  Neck: Trachea normal, normal range of motion and full  passive range of motion without pain. Neck supple. [name] present. No tracheal deviation, no edema and no erythema present. No thyromegaly present.  Cardiovascular: Normal rate, regular rhythm, normal heart sounds, intact distal pulses and normal pulses.  Exam reveals no gallop, no distant heart sounds and no friction rub.   No murmur heard. Pulmonary/Chest: Effort normal and breath sounds normal. No accessory muscle usage. No respiratory distress. He has no wheezes. He has no rales. He exhibits no tenderness.  Abdominal: Soft. Normal appearance and bowel sounds are normal. He exhibits no distension and no ascites. There is no tenderness.  Musculoskeletal: He exhibits no edema.       Right hip: He exhibits decreased range of motion, decreased strength (s/p Rigth femur fracture) and tenderness (minimal).  Expected osteoarthritis, stiffness  Neurological: He is alert and oriented to person, place, and time. He has normal strength.  Skin: Skin is warm and dry. Laceration (incision- cdi) noted. No rash noted. He is not diaphoretic. No cyanosis or erythema. No pallor. Nails show no clubbing.  Psychiatric: He has a normal mood and affect. His speech is normal and behavior is normal.  Judgment and thought content normal. Cognition and memory are normal.  Nursing note and vitals reviewed.   Labs reviewed: Basic Metabolic Panel:  Recent Labs  11/19/15 0538 11/22/15 0644 11/26/15 0722  NA 136 138 136  K 4.1 4.4 4.4  CL 106 108 105  CO2 24 25 25   GLUCOSE 132* 181* 129*  BUN 26* 36* 38*  CREATININE 1.63* 1.58* 1.34*  CALCIUM 8.6* 8.6* 8.9   Liver Function Tests:  Recent Labs  11/26/15 0722  AST 25  ALT 67*  ALKPHOS 127*  BILITOT 1.1  PROT 6.0*  ALBUMIN 3.2*   No results for input(s): LIPASE, AMYLASE in the last 8760 hours. No results for input(s): AMMONIA in the last 8760 hours. CBC:  Recent Labs  11/16/15 1303  11/21/15 0421 11/22/15 0644 11/26/15 0722  WBC 13.5*  < > 7.8 7.2 9.7  NEUTROABS 11.8*  --   --   --  6.9*  HGB 12.4*  < > 8.0* 9.2* 10.2*  HCT 36.0*  < > 24.0* 26.9* 30.4*  MCV 93.0  < > 93.4 86.8 89.9  PLT 143*  < > 158 195 328  < > = values in this interval not displayed. Cardiac Enzymes: No results for input(s): CKTOTAL, CKMB, CKMBINDEX, TROPONINI in the last 8760 hours. BNP: Invalid input(s): POCBNP CBG:  Recent Labs  12/11/15 1159 12/11/15 1627 12/11/15 2031  GLUCAP 132* 125* 205*    Procedures and Imaging Studies During Stay: Chest Portable 1 View  Result Date: 11/16/2015 CLINICAL DATA:  Pre-op imaging for fractured hip surgery tomorrow. Hx of HTN, diabetes, stroke, prostate cancer. EXAM: PORTABLE CHEST 1 VIEW COMPARISON:  01/29/2012 FINDINGS: The heart size and mediastinal contours are within normal limits. Both lungs are clear. The visualized skeletal structures are unremarkable. IMPRESSION: No active disease. Electronically Signed   By: Nolon Nations M.D.   On: 11/16/2015 17:13   Dg C-arm 1-60 Min  Result Date: 11/17/2015 CLINICAL DATA:  Hip fracture.  Intra medullary rod placement. EXAM: DG C-ARM 61-120 MIN; OPERATIVE RIGHT HIP WITH PELVIS COMPARISON:  Right hip radiographs 11/16/2015 FINDINGS: A right femoral  inter medullary rod is in place. A dynamic hip screw is noted. Alignment is anatomic. A single interlocking screw is present distally. Surgical clips are again noted in the pelvis. IMPRESSION: Intra medullary right femoral rod with dynamic  hip screw. Alignment is anatomic. No radiographic evidence for complication. Electronically Signed   By: San Morelle M.D.   On: 11/17/2015 10:04   Dg Hip Operative Unilat W Or W/o Pelvis Right  Result Date: 11/17/2015 CLINICAL DATA:  Hip fracture.  Intra medullary rod placement. EXAM: DG C-ARM 61-120 MIN; OPERATIVE RIGHT HIP WITH PELVIS COMPARISON:  Right hip radiographs 11/16/2015 FINDINGS: A right femoral inter medullary rod is in place. A dynamic hip screw is noted. Alignment is anatomic. A single interlocking screw is present distally. Surgical clips are again noted in the pelvis. IMPRESSION: Intra medullary right femoral rod with dynamic hip screw. Alignment is anatomic. No radiographic evidence for complication. Electronically Signed   By: San Morelle M.D.   On: 11/17/2015 10:04   Dg Hip Unilat With Pelvis 2-3 Views Right  Result Date: 11/16/2015 CLINICAL DATA:  Right-sided hip pain after fall on concrete. EXAM: DG HIP (WITH OR WITHOUT PELVIS) 2-3V RIGHT COMPARISON:  None. FINDINGS: Frontal pelvis with AP and frog-leg lateral views of the right hip show intertrochanteric right femoral neck fracture. Numerous surgical clips over the pelvis suggest prior lymph node dissection. Bones are demineralized. IMPRESSION: Intertrochanteric right proximal femur fracture. Electronically Signed   By: Misty Stanley M.D.   On: 11/16/2015 13:11    Assessment/Plan:   1. Other closed fracture of distal end of right femur with routine healing, subsequent encounter Pt progressed well with physical therapy. Reports his pain as a 9/10 at worst- after therapy, but very minimal generally. Pain does not wake him from his sleep. Pt is A&O, pleasant. Eager to go home. All  questions answered.    Patient is being discharged with the following home health services:  PT/OT/SN  Patient is being discharged with the following durable medical equipment:  RW, [facility]  Patient has been advised to f/u with their PCP in 1-2 weeks to bring them up to date on their rehab stay.  Social services at facility was responsible for arranging this appointment.  Pt was provided with a 30 day supply of prescriptions for medications and refills must be obtained from their PCP.  For controlled substances, a more limited supply may be provided adequate until PCP appointment only.  Future labs/tests needed:  none  Family/ staff Communication:   Total Time: 30 minutes  Documentation: 15 minutes  Face to Face: 15 minutes  Family/Phone:  Vikki Ports, NP-C [facility] [address], [place] [address] Cell Phone (Mon-Fri 8am-5pm):  [phone] On Call:  [phone] & follow prompts after 5pm & weekends Office Phone:  [phone] Office Fax:  [phone]

## 2015-12-13 LAB — GLUCOSE, CAPILLARY
GLUCOSE-CAPILLARY: 75 mg/dL (ref 65–99)
GLUCOSE-CAPILLARY: 141 mg/dL — AB (ref 65–99)

## 2015-12-14 DIAGNOSIS — D649 Anemia, unspecified: Secondary | ICD-10-CM | POA: Diagnosis not present

## 2015-12-14 DIAGNOSIS — Z7984 Long term (current) use of oral hypoglycemic drugs: Secondary | ICD-10-CM | POA: Diagnosis not present

## 2015-12-14 DIAGNOSIS — S72141D Displaced intertrochanteric fracture of right femur, subsequent encounter for closed fracture with routine healing: Secondary | ICD-10-CM | POA: Diagnosis not present

## 2015-12-14 DIAGNOSIS — R296 Repeated falls: Secondary | ICD-10-CM | POA: Diagnosis not present

## 2015-12-14 DIAGNOSIS — M6281 Muscle weakness (generalized): Secondary | ICD-10-CM | POA: Diagnosis not present

## 2015-12-14 DIAGNOSIS — I129 Hypertensive chronic kidney disease with stage 1 through stage 4 chronic kidney disease, or unspecified chronic kidney disease: Secondary | ICD-10-CM | POA: Diagnosis not present

## 2015-12-14 DIAGNOSIS — I69328 Other speech and language deficits following cerebral infarction: Secondary | ICD-10-CM | POA: Diagnosis not present

## 2015-12-14 DIAGNOSIS — Z8546 Personal history of malignant neoplasm of prostate: Secondary | ICD-10-CM | POA: Diagnosis not present

## 2015-12-14 DIAGNOSIS — N183 Chronic kidney disease, stage 3 (moderate): Secondary | ICD-10-CM | POA: Diagnosis not present

## 2015-12-14 DIAGNOSIS — E1122 Type 2 diabetes mellitus with diabetic chronic kidney disease: Secondary | ICD-10-CM | POA: Diagnosis not present

## 2015-12-17 DIAGNOSIS — Z8546 Personal history of malignant neoplasm of prostate: Secondary | ICD-10-CM | POA: Diagnosis not present

## 2015-12-17 DIAGNOSIS — R296 Repeated falls: Secondary | ICD-10-CM | POA: Diagnosis not present

## 2015-12-17 DIAGNOSIS — E1122 Type 2 diabetes mellitus with diabetic chronic kidney disease: Secondary | ICD-10-CM | POA: Diagnosis not present

## 2015-12-17 DIAGNOSIS — D649 Anemia, unspecified: Secondary | ICD-10-CM | POA: Diagnosis not present

## 2015-12-17 DIAGNOSIS — Z7984 Long term (current) use of oral hypoglycemic drugs: Secondary | ICD-10-CM | POA: Diagnosis not present

## 2015-12-17 DIAGNOSIS — N183 Chronic kidney disease, stage 3 (moderate): Secondary | ICD-10-CM | POA: Diagnosis not present

## 2015-12-17 DIAGNOSIS — I129 Hypertensive chronic kidney disease with stage 1 through stage 4 chronic kidney disease, or unspecified chronic kidney disease: Secondary | ICD-10-CM | POA: Diagnosis not present

## 2015-12-17 DIAGNOSIS — I69328 Other speech and language deficits following cerebral infarction: Secondary | ICD-10-CM | POA: Diagnosis not present

## 2015-12-17 DIAGNOSIS — S72141D Displaced intertrochanteric fracture of right femur, subsequent encounter for closed fracture with routine healing: Secondary | ICD-10-CM | POA: Diagnosis not present

## 2015-12-17 DIAGNOSIS — M6281 Muscle weakness (generalized): Secondary | ICD-10-CM | POA: Diagnosis not present

## 2015-12-25 DIAGNOSIS — R296 Repeated falls: Secondary | ICD-10-CM | POA: Diagnosis not present

## 2015-12-25 DIAGNOSIS — Z8546 Personal history of malignant neoplasm of prostate: Secondary | ICD-10-CM | POA: Diagnosis not present

## 2015-12-25 DIAGNOSIS — M6281 Muscle weakness (generalized): Secondary | ICD-10-CM | POA: Diagnosis not present

## 2015-12-25 DIAGNOSIS — I69328 Other speech and language deficits following cerebral infarction: Secondary | ICD-10-CM | POA: Diagnosis not present

## 2015-12-25 DIAGNOSIS — N183 Chronic kidney disease, stage 3 (moderate): Secondary | ICD-10-CM | POA: Diagnosis not present

## 2015-12-25 DIAGNOSIS — S72141D Displaced intertrochanteric fracture of right femur, subsequent encounter for closed fracture with routine healing: Secondary | ICD-10-CM | POA: Diagnosis not present

## 2015-12-25 DIAGNOSIS — D649 Anemia, unspecified: Secondary | ICD-10-CM | POA: Diagnosis not present

## 2015-12-25 DIAGNOSIS — I129 Hypertensive chronic kidney disease with stage 1 through stage 4 chronic kidney disease, or unspecified chronic kidney disease: Secondary | ICD-10-CM | POA: Diagnosis not present

## 2015-12-25 DIAGNOSIS — E1122 Type 2 diabetes mellitus with diabetic chronic kidney disease: Secondary | ICD-10-CM | POA: Diagnosis not present

## 2015-12-25 DIAGNOSIS — Z7984 Long term (current) use of oral hypoglycemic drugs: Secondary | ICD-10-CM | POA: Diagnosis not present

## 2016-01-01 DIAGNOSIS — M6281 Muscle weakness (generalized): Secondary | ICD-10-CM | POA: Diagnosis not present

## 2016-01-01 DIAGNOSIS — N183 Chronic kidney disease, stage 3 (moderate): Secondary | ICD-10-CM | POA: Diagnosis not present

## 2016-01-01 DIAGNOSIS — I69328 Other speech and language deficits following cerebral infarction: Secondary | ICD-10-CM | POA: Diagnosis not present

## 2016-01-01 DIAGNOSIS — Z8546 Personal history of malignant neoplasm of prostate: Secondary | ICD-10-CM | POA: Diagnosis not present

## 2016-01-01 DIAGNOSIS — S72141D Displaced intertrochanteric fracture of right femur, subsequent encounter for closed fracture with routine healing: Secondary | ICD-10-CM | POA: Diagnosis not present

## 2016-01-01 DIAGNOSIS — Z7984 Long term (current) use of oral hypoglycemic drugs: Secondary | ICD-10-CM | POA: Diagnosis not present

## 2016-01-01 DIAGNOSIS — R296 Repeated falls: Secondary | ICD-10-CM | POA: Diagnosis not present

## 2016-01-01 DIAGNOSIS — E1122 Type 2 diabetes mellitus with diabetic chronic kidney disease: Secondary | ICD-10-CM | POA: Diagnosis not present

## 2016-01-01 DIAGNOSIS — D649 Anemia, unspecified: Secondary | ICD-10-CM | POA: Diagnosis not present

## 2016-01-01 DIAGNOSIS — I129 Hypertensive chronic kidney disease with stage 1 through stage 4 chronic kidney disease, or unspecified chronic kidney disease: Secondary | ICD-10-CM | POA: Diagnosis not present

## 2016-01-17 DIAGNOSIS — Z0000 Encounter for general adult medical examination without abnormal findings: Secondary | ICD-10-CM | POA: Diagnosis not present

## 2016-01-17 DIAGNOSIS — Z125 Encounter for screening for malignant neoplasm of prostate: Secondary | ICD-10-CM | POA: Diagnosis not present

## 2016-01-17 DIAGNOSIS — Z79899 Other long term (current) drug therapy: Secondary | ICD-10-CM | POA: Diagnosis not present

## 2016-01-30 DIAGNOSIS — Z23 Encounter for immunization: Secondary | ICD-10-CM | POA: Diagnosis not present

## 2016-01-30 DIAGNOSIS — Z09 Encounter for follow-up examination after completed treatment for conditions other than malignant neoplasm: Secondary | ICD-10-CM | POA: Diagnosis not present

## 2016-01-30 DIAGNOSIS — E118 Type 2 diabetes mellitus with unspecified complications: Secondary | ICD-10-CM | POA: Diagnosis not present

## 2016-03-06 DIAGNOSIS — H9311 Tinnitus, right ear: Secondary | ICD-10-CM | POA: Diagnosis not present

## 2016-03-06 DIAGNOSIS — J301 Allergic rhinitis due to pollen: Secondary | ICD-10-CM | POA: Diagnosis not present

## 2016-03-06 DIAGNOSIS — H6981 Other specified disorders of Eustachian tube, right ear: Secondary | ICD-10-CM | POA: Diagnosis not present

## 2016-03-06 DIAGNOSIS — H6123 Impacted cerumen, bilateral: Secondary | ICD-10-CM | POA: Diagnosis not present

## 2016-04-06 DIAGNOSIS — L570 Actinic keratosis: Secondary | ICD-10-CM | POA: Diagnosis not present

## 2016-04-06 DIAGNOSIS — L959 Vasculitis limited to the skin, unspecified: Secondary | ICD-10-CM | POA: Diagnosis not present

## 2016-06-28 ENCOUNTER — Observation Stay (HOSPITAL_COMMUNITY)
Admission: EM | Admit: 2016-06-28 | Discharge: 2016-06-30 | Disposition: A | Discharge status: Home / Self Care | Payer: PPO | Attending: Internal Medicine | Admitting: Internal Medicine

## 2016-06-28 ENCOUNTER — Encounter (HOSPITAL_COMMUNITY): Payer: Self-pay

## 2016-06-28 ENCOUNTER — Emergency Department (HOSPITAL_COMMUNITY): Payer: PPO

## 2016-06-28 DIAGNOSIS — Z794 Long term (current) use of insulin: Secondary | ICD-10-CM | POA: Diagnosis not present

## 2016-06-28 DIAGNOSIS — Z87442 Personal history of urinary calculi: Secondary | ICD-10-CM | POA: Insufficient documentation

## 2016-06-28 DIAGNOSIS — R3129 Other microscopic hematuria: Secondary | ICD-10-CM | POA: Diagnosis not present

## 2016-06-28 DIAGNOSIS — N183 Chronic kidney disease, stage 3 unspecified: Secondary | ICD-10-CM | POA: Diagnosis present

## 2016-06-28 DIAGNOSIS — I129 Hypertensive chronic kidney disease with stage 1 through stage 4 chronic kidney disease, or unspecified chronic kidney disease: Secondary | ICD-10-CM | POA: Diagnosis not present

## 2016-06-28 DIAGNOSIS — E1122 Type 2 diabetes mellitus with diabetic chronic kidney disease: Secondary | ICD-10-CM | POA: Insufficient documentation

## 2016-06-28 DIAGNOSIS — G459 Transient cerebral ischemic attack, unspecified: Secondary | ICD-10-CM | POA: Diagnosis not present

## 2016-06-28 DIAGNOSIS — I6789 Other cerebrovascular disease: Secondary | ICD-10-CM | POA: Diagnosis not present

## 2016-06-28 DIAGNOSIS — I088 Other rheumatic multiple valve diseases: Secondary | ICD-10-CM | POA: Diagnosis not present

## 2016-06-28 DIAGNOSIS — Z8546 Personal history of malignant neoplasm of prostate: Secondary | ICD-10-CM | POA: Diagnosis not present

## 2016-06-28 DIAGNOSIS — R410 Disorientation, unspecified: Secondary | ICD-10-CM | POA: Diagnosis not present

## 2016-06-28 DIAGNOSIS — Z882 Allergy status to sulfonamides status: Secondary | ICD-10-CM | POA: Diagnosis not present

## 2016-06-28 DIAGNOSIS — Z7902 Long term (current) use of antithrombotics/antiplatelets: Secondary | ICD-10-CM | POA: Diagnosis not present

## 2016-06-28 DIAGNOSIS — R4781 Slurred speech: Secondary | ICD-10-CM | POA: Diagnosis not present

## 2016-06-28 DIAGNOSIS — Z8673 Personal history of transient ischemic attack (TIA), and cerebral infarction without residual deficits: Secondary | ICD-10-CM | POA: Diagnosis not present

## 2016-06-28 DIAGNOSIS — Z87891 Personal history of nicotine dependence: Secondary | ICD-10-CM | POA: Insufficient documentation

## 2016-06-28 DIAGNOSIS — E785 Hyperlipidemia, unspecified: Secondary | ICD-10-CM | POA: Insufficient documentation

## 2016-06-28 DIAGNOSIS — I672 Cerebral atherosclerosis: Secondary | ICD-10-CM | POA: Diagnosis not present

## 2016-06-28 DIAGNOSIS — E119 Type 2 diabetes mellitus without complications: Secondary | ICD-10-CM

## 2016-06-28 DIAGNOSIS — R531 Weakness: Secondary | ICD-10-CM | POA: Diagnosis not present

## 2016-06-28 DIAGNOSIS — I10 Essential (primary) hypertension: Secondary | ICD-10-CM | POA: Diagnosis present

## 2016-06-28 LAB — CBC WITH DIFFERENTIAL/PLATELET
Basophils Absolute: 0 10*3/uL (ref 0.0–0.1)
Basophils Relative: 0 %
Eosinophils Absolute: 0.2 10*3/uL (ref 0.0–0.7)
Eosinophils Relative: 3 %
HCT: 35.7 % — ABNORMAL LOW (ref 39.0–52.0)
Hemoglobin: 12 g/dL — ABNORMAL LOW (ref 13.0–17.0)
Lymphocytes Relative: 21 %
Lymphs Abs: 1.5 10*3/uL (ref 0.7–4.0)
MCH: 31.4 pg (ref 26.0–34.0)
MCHC: 33.6 g/dL (ref 30.0–36.0)
MCV: 93.5 fL (ref 78.0–100.0)
Monocytes Absolute: 0.5 10*3/uL (ref 0.1–1.0)
Monocytes Relative: 8 %
Neutro Abs: 4.7 10*3/uL (ref 1.7–7.7)
Neutrophils Relative %: 68 %
Platelets: 156 10*3/uL (ref 150–400)
RBC: 3.82 MIL/uL — ABNORMAL LOW (ref 4.22–5.81)
RDW: 13.8 % (ref 11.5–15.5)
WBC: 6.9 10*3/uL (ref 4.0–10.5)

## 2016-06-28 LAB — URINALYSIS, ROUTINE W REFLEX MICROSCOPIC
Bacteria, UA: NONE SEEN
Bilirubin Urine: NEGATIVE
Glucose, UA: NEGATIVE mg/dL
Ketones, ur: NEGATIVE mg/dL
Leukocytes, UA: NEGATIVE
Nitrite: NEGATIVE
Protein, ur: NEGATIVE mg/dL
Specific Gravity, Urine: 1.017 (ref 1.005–1.030)
pH: 5 (ref 5.0–8.0)

## 2016-06-28 LAB — BASIC METABOLIC PANEL
Anion gap: 8 (ref 5–15)
BUN: 23 mg/dL — ABNORMAL HIGH (ref 6–20)
CO2: 25 mmol/L (ref 22–32)
Calcium: 9.3 mg/dL (ref 8.9–10.3)
Chloride: 105 mmol/L (ref 101–111)
Creatinine, Ser: 1.46 mg/dL — ABNORMAL HIGH (ref 0.61–1.24)
GFR calc Af Amer: 49 mL/min — ABNORMAL LOW (ref >60)
GFR calc non Af Amer: 42 mL/min — ABNORMAL LOW (ref >60)
Glucose, Bld: 144 mg/dL — ABNORMAL HIGH (ref 65–99)
Potassium: 4.1 mmol/L (ref 3.5–5.1)
Sodium: 138 mmol/L (ref 135–145)

## 2016-06-28 NOTE — ED Notes (Signed)
EDP at bedside, update given to pt and family about admission.

## 2016-06-28 NOTE — ED Triage Notes (Signed)
Pt arrived via [facility] from home c/o slurred speech lasting 10 minutes and confusion.  Hx of stroke

## 2016-06-28 NOTE — ED Notes (Signed)
Pt transported to MRI 

## 2016-06-29 ENCOUNTER — Observation Stay (HOSPITAL_BASED_OUTPATIENT_CLINIC_OR_DEPARTMENT_OTHER): Payer: PPO

## 2016-06-29 ENCOUNTER — Observation Stay (HOSPITAL_COMMUNITY): Payer: PPO

## 2016-06-29 DIAGNOSIS — R3129 Other microscopic hematuria: Secondary | ICD-10-CM

## 2016-06-29 DIAGNOSIS — Z8673 Personal history of transient ischemic attack (TIA), and cerebral infarction without residual deficits: Secondary | ICD-10-CM

## 2016-06-29 DIAGNOSIS — G459 Transient cerebral ischemic attack, unspecified: Secondary | ICD-10-CM

## 2016-06-29 DIAGNOSIS — E119 Type 2 diabetes mellitus without complications: Secondary | ICD-10-CM

## 2016-06-29 DIAGNOSIS — I639 Cerebral infarction, unspecified: Secondary | ICD-10-CM

## 2016-06-29 DIAGNOSIS — N183 Chronic kidney disease, stage 3 (moderate): Secondary | ICD-10-CM | POA: Diagnosis not present

## 2016-06-29 DIAGNOSIS — I10 Essential (primary) hypertension: Secondary | ICD-10-CM

## 2016-06-29 DIAGNOSIS — R410 Disorientation, unspecified: Secondary | ICD-10-CM | POA: Diagnosis not present

## 2016-06-29 LAB — GLUCOSE, CAPILLARY
Glucose-Capillary: 161 mg/dL — ABNORMAL HIGH (ref 65–99)
Glucose-Capillary: 127 mg/dL — ABNORMAL HIGH (ref 65–99)
Glucose-Capillary: 85 mg/dL (ref 65–99)
Glucose-Capillary: 121 mg/dL — ABNORMAL HIGH (ref 65–99)
Glucose-Capillary: 180 mg/dL — ABNORMAL HIGH (ref 65–99)
Glucose-Capillary: 96 mg/dL (ref 65–99)

## 2016-06-29 LAB — LIPID PANEL
CHOLESTEROL: 107 mg/dL (ref 0–200)
HDL: 28 mg/dL — AB (ref >40)
LDL Cholesterol: 66 mg/dL (ref 0–99)
Total CHOL/HDL Ratio: 3.8 RATIO
Triglycerides: 66 mg/dL (ref <150)
VLDL: 13 mg/dL (ref 0–40)

## 2016-06-29 LAB — ECHOCARDIOGRAM COMPLETE
Height: 68 in
WEIGHTICAEL: 2640 [oz_av]

## 2016-06-29 LAB — VAS US CAROTID
LCCAPDIAS: 12 cm/s
LEFT ECA DIAS: -8 cm/s
LEFT VERTEBRAL DIAS: 16 cm/s
LICADDIAS: -10 cm/s
LICAPDIAS: -15 cm/s
LICAPSYS: -75 cm/s
Left CCA dist dias: -17 cm/s
Left CCA dist sys: -95 cm/s
Left CCA prox sys: 122 cm/s
Left ICA dist sys: -54 cm/s
RCCAPDIAS: 12 cm/s
RIGHT ECA DIAS: -9 cm/s
RIGHT VERTEBRAL DIAS: -8 cm/s
Right CCA prox sys: 98 cm/s
Right cca dist sys: -76 cm/s

## 2016-06-29 MED ORDER — ACETAMINOPHEN 650 MG RE SUPP: 650.0000 mg | RECTAL | Status: DC | PRN

## 2016-06-29 MED ORDER — AMLODIPINE BESYLATE 2.5 MG PO TABS
2.5000 mg | ORAL_TABLET | Freq: Every day | ORAL | Status: DC
  Administered 2016-06-29 – 2016-06-30 (×2): 2.5 mg via ORAL
  Filled 2016-06-29 (×2): qty 1

## 2016-06-29 MED ORDER — ACETAMINOPHEN 325 MG PO TABS: 650.0000 mg | ORAL_TABLET | ORAL | Status: DC | PRN

## 2016-06-29 MED ORDER — ACETAMINOPHEN 160 MG/5ML PO SOLN: 650.0000 mg | ORAL | Status: DC | PRN

## 2016-06-29 MED ORDER — PANTOPRAZOLE SODIUM 40 MG PO TBEC
40.0000 mg | DELAYED_RELEASE_TABLET | Freq: Every day | ORAL | Status: DC
  Administered 2016-06-29 – 2016-06-30 (×2): 40 mg via ORAL
  Filled 2016-06-29 (×2): qty 1

## 2016-06-29 MED ORDER — INSULIN ASPART 100 UNIT/ML ~~LOC~~ SOLN: 0.0000 [IU] | Freq: Every day | SUBCUTANEOUS | Status: DC

## 2016-06-29 MED ORDER — CLOPIDOGREL BISULFATE 75 MG PO TABS
75.0000 mg | ORAL_TABLET | Freq: Every day | ORAL | Status: DC
  Administered 2016-06-29 – 2016-06-30 (×2): 75 mg via ORAL
  Filled 2016-06-29 (×2): qty 1

## 2016-06-29 MED ORDER — STROKE: EARLY STAGES OF RECOVERY BOOK
Freq: Once | Status: AC
  Administered 2016-06-29: 01:00:00
  Filled 2016-06-29: qty 1

## 2016-06-29 MED ORDER — ENOXAPARIN SODIUM 40 MG/0.4ML ~~LOC~~ SOLN
40.0000 mg | Freq: Every day | SUBCUTANEOUS | Status: DC
  Administered 2016-06-29 (×2): 40 mg via SUBCUTANEOUS
  Filled 2016-06-29 (×2): qty 0.4

## 2016-06-29 MED ORDER — INSULIN ASPART 100 UNIT/ML ~~LOC~~ SOLN
0.0000 [IU] | Freq: Three times a day (TID) | SUBCUTANEOUS | Status: DC
  Administered 2016-06-29 (×2): 1 [IU] via SUBCUTANEOUS
  Administered 2016-06-29: 2 [IU] via SUBCUTANEOUS
  Administered 2016-06-30: 1 [IU] via SUBCUTANEOUS

## 2016-06-29 MED ORDER — LOSARTAN POTASSIUM 50 MG PO TABS
50.0000 mg | ORAL_TABLET | Freq: Every day | ORAL | Status: DC
  Administered 2016-06-29 (×2): 50 mg via ORAL
  Filled 2016-06-29 (×2): qty 1

## 2016-06-29 MED ORDER — ATORVASTATIN CALCIUM 20 MG PO TABS
20.0000 mg | ORAL_TABLET | Freq: Every day | ORAL | Status: DC
  Administered 2016-06-29: 20 mg via ORAL
  Filled 2016-06-29 (×2): qty 1
  Filled 2016-06-29: qty 2

## 2016-06-29 NOTE — H&P (Signed)
History and Physical  Patient Name: Tyler [name]     [id number]    DOB: [birth year]    DOA: 06/28/2016 PCP: Dwan Bolt, MD   Patient coming from: Home     Chief Complaint: Transient confusion  HPI: Tyler [name] is a 83 y.o. male with a past medical history significant for NIDDM, HTN, and stroke without residual deficits who presents with transient confusion, slurred speech.  Caveat that patient may be unreliable historian, family not available to corroborate. The patient was in his usual state of health (lives with wife, independent with all ADLs, walks with cane, went to church this morning, out to eat afterwards as usual) until this evening, he drifted off to sleep while watching a golf tournament on TV, and when his son woke him from his nap in order to chat, the patient was more disoriented than usual.  Per patient, he knew where he was, but just couldn't remember where he had eaten for lunch that day which is not like him to be forgetful and was a little dizzy briefly.  Patient thinks he may also have had some slurred speech.  All of this lasted only 10 minutes, and family brought patient to ER.  There was no paresthesias, focal weakness, passing out, facial asymmetry, visual disturbances, ataxia, recrudescence of old right sided deficits.  ED course: -Afebrile, heart rate 63, respirations and pulse is normal, blood pressure 166/75 -Na 138, K 4.1, Cr 1.46 (baseline 1.3), WBC 6.9 K, Hgb 12 -Urinalysis showed microscopic hematuria -CT of the head without contrast showed chronic small vessel disease, NAICP -ECG was unremarkable -The case was discussed with neurology, who recommended TIA workup      Review of systems:  Review of Systems  Constitutional: Negative for chills, fever and malaise/fatigue.  Genitourinary: Negative for dysuria, frequency, hematuria and urgency.  Neurological: Positive for dizziness and speech change. Negative for tingling, tremors, sensory  change, focal weakness, seizures, loss of consciousness and headaches.  Psychiatric/Behavioral: Positive for memory loss.  All other systems reviewed and are negative.        Past Medical History:  Diagnosis Date  . Cancer [facility])    Prostate  . Diabetes mellitus without complication ([place])   . Hypertension   . Kidney stones   . Stroke [facility])     Past Surgical History:  Procedure Laterality Date  . CARDIAC CATHETERIZATION    . FEMUR IM NAIL Right 11/17/2015   Procedure: INTRAMEDULLARY (IM) NAIL FEMORAL;  Surgeon: Marybelle Killings, MD;  Location: [place];  Service: Orthopedics;  Laterality: Right;  . PROSTATE SURGERY      Social History: Patient lives with his wife.  Patient walks with a cane.  He is a remote former smoker.  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  He is from [place].    Allergies  Allergen Reactions  . Sulfa Drugs Cross Reactors Anaphylaxis and Rash    Lungs fill with fluid    Family history: family history includes Emphysema in his father; Heart Problems in his mother.  Prior to Admission medications   Medication Sig Start Date End Date Taking? Authorizing Provider  amLODipine (NORVASC) 2.5 MG tablet Take 2.5 mg by mouth daily.  09/19/12  Yes Historical Provider, MD  atorvastatin (LIPITOR) 20 MG tablet Take 20 mg by mouth daily at 6 PM.    Yes Historical Provider, MD  cholecalciferol (VITAMIN D) 1000 UNITS tablet Take 1,000 Units by mouth 2 (two) times daily.  Yes Historical Provider, MD  clopidogrel (PLAVIX) 75 MG tablet Take 1 tablet (75 mg total) by mouth daily with breakfast. 03/20/15  Yes Dennie Bible, NP  cyanocobalamin 1000 MCG tablet Take 1,000 mcg by mouth 2 (two) times daily.    Yes Historical Provider, MD  docusate sodium (COLACE) 100 MG capsule Take 1 capsule (100 mg total) by mouth 2 (two) times daily. Patient taking differently: Take 100 mg by mouth every 3 (three) days.  11/20/15  Yes Hosie Poisson, MD  fish  oil-omega-3 fatty acids 1000 MG capsule Take 1 g by mouth 2 (two) times daily.    Yes Historical Provider, MD  Flaxseed, Linseed, (FLAXSEED OIL) 1000 MG CAPS Take 1,000 mg by mouth 2 (two) times daily.    Yes Historical Provider, MD  glimepiride (AMARYL) 2 MG tablet Take 2 mg by mouth daily. 03/21/14  Yes Historical Provider, MD  ibuprofen (ADVIL,MOTRIN) 200 MG tablet Take 200 mg by mouth every 6 (six) hours as needed for headache (pain).   Yes Historical Provider, MD  losartan (COZAAR) 50 MG tablet Take 50 mg by mouth at bedtime.    Yes Historical Provider, MD  pantoprazole (PROTONIX) 40 MG tablet Take 1 tablet (40 mg total) by mouth daily. 02/12/12  Yes Daniel J Angiulli, PA-C  [place] Johns Wort 1000 MG CAPS Take 1,000 mg by mouth at bedtime.   Yes Historical Provider, MD  Lancets Glory Rosebush ULTRASOFT) lancets  07/07/12   Historical Provider, MD  ONE TOUCH ULTRA TEST test strip  09/06/12   Historical Provider, MD     Physical Exam: BP 143/64   Pulse 60   Temp 98.5 F (36.9 C)   Resp 11   SpO2 99%  General appearance: Well-developed, elderly adult male, alert and in no acute distress.   Eyes: Anicteric, conjunctiva pink, lids and lashes normal. PERRL.    ENT: No nasal deformity, discharge, epistaxis.  Hearing slightly diminihsed. OP moist without lesions.   Dentition good. Lymph: No cervical, supraclavicular or axillary lymphadenopathy. Skin: Warm and dry.  No jaundice.  No suspicious rashes or lesions. Cardiac: RRR, nl S1-S2, no murmurs appreciated.  Capillary refill is brisk.  JVP normal.  No LE edema.  Radial pulses 2+ and symmetric.  No carotid bruits. Respiratory: Normal respiratory rate and rhythm.  CTAB without rales or wheezes. GI: Abdomen soft without rigidity. No ascites, distension, no hepatosplenomegaly.   MSK: No deformities or effusions. Neuro: Pupils are 4 mm and reactive to 3 mm. Extraocular movements are intact, without nystagmus. Cranial nerve 5 is within normal limits.  Cranial nerve 7 is symmetrical. Cranial nerve 8 is within normal limits. Cranial nerves 9 and 10 reveal equal palate elevation. Cranial nerve 11 reveals sternocleidomastoid strong. Cranial nerve 12 is midline. I do not note a deficit in motor strength testing in the upper and lower extremities bilaterally with normal motor, tone and bulk. Romberg maneuver is negative for pathology. Finger-to-nose testing is within normal limits. Speech is fluent. Naming is grossly intact. Attention span and concentration are within normal limits.   Psych: The patient is oriented to time, place and person. Behavior appropriate.  Affect pleasant.  Recall, recent and remote, as well as general fund of knowledge seem within normal limits. No evidence of aural or visual hallucinations or delusions.       Labs on Admission:  I have personally reviewed following labs and imaging studies: CBC:  Recent Labs Lab 06/28/16 2018  WBC 6.9  NEUTROABS 4.7  HGB  12.0*  HCT 35.7*  MCV 93.5  PLT A999333   Basic Metabolic Panel:  Recent Labs Lab 06/28/16 2018  NA 138  K 4.1  CL 105  CO2 25  GLUCOSE 144*  BUN 23*  CREATININE 1.46*  CALCIUM 9.3   GFR: CrCl cannot be calculated (Unknown ideal weight.). Liver Function Tests: No results for input(s): AST, ALT, ALKPHOS, BILITOT, PROT, ALBUMIN in the last 168 hours. No results for input(s): LIPASE, AMYLASE in the last 168 hours. No results for input(s): AMMONIA in the last 168 hours. Coagulation Profile: No results for input(s): INR, PROTIME in the last 168 hours. Cardiac Enzymes: No results for input(s): CKTOTAL, CKMB, CKMBINDEX, TROPONINI in the last 168 hours. BNP (last 3 results) No results for input(s): PROBNP in the last 8760 hours. HbA1C: No results for input(s): HGBA1C in the last 72 hours. CBG: No results for input(s): GLUCAP in the last 168 hours. Lipid Profile: No results for input(s): CHOL, HDL, LDLCALC, TRIG, CHOLHDL, LDLDIRECT in the last 72  hours. Thyroid Function Tests: No results for input(s): TSH, T4TOTAL, FREET4, T3FREE, THYROIDAB in the last 72 hours. Anemia Panel: No results for input(s): VITAMINB12, FOLATE, FERRITIN, TIBC, IRON, RETICCTPCT in the last 72 hours. Sepsis Labs: Invalid input(s): PROCALCITONIN, LACTICIDVEN No results found for this or any previous visit (from the past 240 hour(s)).    Radiological Exams on Admission: Personally reviewed CT head report: Ct Head Wo Contrast  Result Date: 06/28/2016 CLINICAL DATA:  Confusion and difficulty with speech. EXAM: CT HEAD WITHOUT CONTRAST TECHNIQUE: Contiguous axial images were obtained from the base of the skull through the vertex without intravenous contrast. COMPARISON:  01/28/2012 FINDINGS: Brain: Chronic infarct within the left centrum semiovale is identified and is new from 01/28/2012. There is low attenuation throughout the subcortical and periventricular white matter compatible with chronic small vessel ischemic disease. No evidence for acute brain infarct, intracranial hemorrhage or mass. Vascular: No hyperdense vessel or unexpected calcification. Skull: The calvarium is intact. Sinuses/Orbits: No acute finding. Other: None IMPRESSION: 1. No acute intracranial abnormality. 2. Chronic small vessel ischemic change. Electronically Signed   By: Kerby Moors M.D.   On: 06/28/2016 21:36   Mr Brain Wo Contrast  Result Date: 06/29/2016 CLINICAL DATA:  Initial evaluation for episode of slurred speech. EXAM: MRI HEAD WITHOUT CONTRAST TECHNIQUE: Multiplanar, multiecho pulse sequences of the brain and surrounding structures were obtained without intravenous contrast. COMPARISON:  Prior CT from earlier the same day. FINDINGS: Brain: Diffuse prominence of the CSF containing spaces is compatible with generalized age-related cerebral atrophy. Patchy and confluent T2/FLAIR hyperintensity within the periventricular and deep white matter both cerebral hemispheres most compatible  chronic small vessel ischemic disease, mild for age. Chronic microvascular changes present within the pons as well. Superimposed remote lacunar infarcts involve the bilateral basal ganglia/ corona radiata. Associated chronic hemorrhagic blood products noted. No area of remote cortical infarction. No abnormal foci of restricted diffusion to suggest acute or subacute ischemia. Gray-white matter differentiation maintained. No evidence for acute intracranial hemorrhage. No mass lesion, midline shift, or mass effect. Diffuse ventricular prominence most likely related global parenchymal volume loss, although a component of underlying NPH may be present as well. No extra-axial fluid collection. Major dural sinuses are grossly patent. Incidental note made of a partially empty sella. Vascular: Major intracranial vascular flow voids are maintained. Skull and upper cervical spine: Craniocervical junction within normal limits. Mild degenerative thickening noted at the tectorial membrane. No significant stenosis noted within the visualized upper cervical  spine. Bone marrow signal intensity within normal limits. No scalp soft tissue abnormality. Sinuses/Orbits: Globes and orbital soft tissues within normal limits. Patient is status post cataract extraction bilaterally. Paranasal sinuses are clear. No mastoid effusion. Inner ear structures grossly normal. T2 hyperintensity at the left petrous apex likely reflects a small amount of trapped fluid. Other: No other significant finding. IMPRESSION: 1. No acute intracranial infarct or other process identified. 2. Mild chronic microvascular ischemic disease with remote lacunar infarcts involving the bilateral basal ganglia/corona radiata. 3. Mild ventricular prominence, likely related to underlying global atrophy, although a component of underlying NPH would be difficult to exclude. Electronically Signed   By: Jeannine Boga M.D.   On: 06/29/2016 00:13     EKG: Independently  reviewed. Rate 64, QTc 415, LAFB, no change from previous.  No ST changes.       Assessment/Plan  1. Slurred speech:  Can't rule out TIA.  MRI shows no infarct.  ABCD 5. -Admit to telemetry -Neuro checks, NIHSS per protocol -Continue Plavix -Lipids, hemoglobin A1c -Carotid doppler, MRA or CT angiography of head and neck per Neurology, ordered -Echocardiogram ordered -PT/OT/SLP consultation -Consult to Neurology, appreciate recommendations   2. Diabetes:  -Hold sulfonylurea -SSI with meals  3. HTN:  Hypertensive at admission. -Continue amlodipine, losartan  4. History of stroke:  -Continue statin, Plavix  5. Other medications:  -Continue PPI -Harold Wort  6. CKD III: Baseline Cr 1.3.  Stable.  7. Microscopic hematuria: -Repeat UA in 6 weeks with PCP         DVT prophylaxis: Lovenox  Code Status: FULL  Family Communication: None present  Disposition Plan: Anticipate Stroke work up as above and consult to ancillary services.  Expect discharge within 1 day . Consults called: Neurology, Dr. Nicole Kindred has seen patient. Admission status: Telemetry, OBS status  Core measures: -VTE prophylaxis ordered at time of admission -On antiplatelet agent -Atrial fibrillation: Not present -tPA not given because of symptoms resolved -Dysphagia screen ordered in ER -Lipids ordered -PT eval ordered -Non-smoker       Medical decision making: Patient seen at 12:39 AM on 06/29/2016.  The patient was discussed with Dr. Nicole Kindred and Irena Cords, PA-C. What exists of the patient's chart was reviewed in depth and summarized above.  Clinical condition: stable.       Edwin Dada [facility] Pager [phone]

## 2016-06-29 NOTE — Care Management Note (Signed)
Case Management Note  Patient Details  Name: Tyler [name] MRN: [id number] Date of Birth: 09-11-1928  Subjective/Objective:                  Patient presented with transient confusion. Lives at home with spouse. CM will follow for discharge needs pending PT/OT evals and physician orders.   Action/Plan:   Expected Discharge Date:                  Expected Discharge Plan:     In-House Referral:     Discharge planning Services     Post Acute Care Choice:    Choice offered to:     DME Arranged:    DME Agency:     HH Arranged:    HH Agency:     Status of Service:     If discussed at [employer] of Stay Meetings, dates discussed:    Additional Comments:  Rolm Baptise, RN 06/29/2016, 11:41 AM

## 2016-06-29 NOTE — Care Management Obs Status (Signed)
[facility] NOTIFICATION   Patient Details  Name: TARION ARDUINI MRN: [id number] Date of Birth: [birth year]   Medicare Observation Status Notification Given:  Yes    Pollie Friar, RN 06/29/2016, 1:17 PM

## 2016-06-29 NOTE — Progress Notes (Signed)
STROKE TEAM PROGRESS NOTE   HISTORY OF PRESENT ILLNESS (per record) Tyler [name] is an 83 y.o. male history diabetes mellitus, hypertension, previous stroke and kidney stones, presenting following an episode of transient speech difficulty and confusion. He reportedly had slurring of speech with gibberish content which apparently lasted about 10 minutes. He has a history of previous stroke which affected arm and leg requiring rehabilitation. He's been taking Plavix 75 mg per day. CT scan of her head showed no acute intracranial abnormality. MRI study also showed no acute findings including no acute stroke. Chronic microvascular ischemic changes and lacunar infarctions were noted. NIH stroke score at the time of this evaluation was 0. There is no associated facial droop nor weakness of right arm and leg with his presenting symptom. Patient was not administered IV t-PA secondary to Deficits resolved. He was admitted for further evaluation and treatment.   SUBJECTIVE (INTERVAL HISTORY) Wife and son are at bedside. Patient is back to baseline at this moment. However, recounted history of present illness with wife and patient. Wife stated that he was in the nap yesterday, when woke up, he was gibberish in speaking, and not meaningful sentences. Patient does not have any memory of it, but wife stated that he was conscious, not sure if there was any staring or blanking. Denies any seizure-like activity.   OBJECTIVE Temp:  [98.5 F (36.9 C)-98.8 F (37.1 C)] 98.6 F (37 C) (02/26 0624) Pulse Rate:  [57-78] 61 (02/26 0624) Cardiac Rhythm: Heart block (02/26 0701) Resp:  [11-20] 12 (02/26 0624) BP: (124-177)/(36-84) 156/48 (02/26 0624) SpO2:  [97 %-100 %] 98 % (02/26 0624) Weight:  [74.8 kg (165 lb)] 74.8 kg (165 lb) (02/26 0100)  CBC:  Recent Labs Lab 06/28/16 2018  WBC 6.9  NEUTROABS 4.7  HGB 12.0*  HCT 35.7*  MCV 93.5  PLT A999333    Basic Metabolic Panel:  Recent Labs Lab 06/28/16 2018   NA 138  K 4.1  CL 105  CO2 25  GLUCOSE 144*  BUN 23*  CREATININE 1.46*  CALCIUM 9.3    Lipid Panel:    Component Value Date/Time   CHOL 107 06/29/2016 0701   TRIG 66 06/29/2016 0701   HDL 28 (L) 06/29/2016 0701   CHOLHDL 3.8 06/29/2016 0701   VLDL 13 06/29/2016 0701   LDLCALC 66 06/29/2016 0701   HgbA1c:  Lab Results  Component Value Date   HGBA1C 6.6 (H) 11/16/2015   Urine Drug Screen: No results found for: LABOPIA, COCAINSCRNUR, LABBENZ, AMPHETMU, THCU, LABBARB    IMAGING I have personally reviewed the radiological images below and agree with the radiology interpretations.  Ct Head Wo Contrast 06/28/2016 1. No acute intracranial abnormality. 2. Chronic small vessel ischemic change.   Mr Brain Wo Contrast 06/29/2016 1. No acute intracranial infarct or other process identified. 2. Mild chronic microvascular ischemic disease with remote lacunar infarcts involving the bilateral basal ganglia/corona radiata. 3. Mild ventricular prominence, likely related to underlying global atrophy, although a component of underlying NPH would be difficult to exclude.   Mr Jodene Nam Head Wo Contrast 06/29/2016 No large or medium vessel intracranial occlusion. No anterior circulation pathology is seen. 25% stenosis of the distal basilar artery. Atherosclerotic change of the superior cerebellar and PCA branches, most notable in the right PCA where there is stenosis proximally 30-50%. Findings are slightly progressive since 2013.   2D Echocardiogram  - Left ventricle: Inferobasal hypokinesis. The cavity size was normal. Systolic function was normal. The estimated ejection  fraction was 55%. Wall motion was normal; there were no regional wall motion abnormalities. Left ventricular diastolic function parameters were normal. - Atrial septum: No defect or patent foramen ovale was identified.  CUS - Bilateral: 1-39% ICA stenosis. Vertebral artery flow is antegrade.  EEG pending   PHYSICAL  EXAM  Temp:  [98.5 F (36.9 C)-98.8 F (37.1 C)] 98.6 F (37 C) (02/26 1424) Pulse Rate:  [57-78] 58 (02/26 1424) Resp:  [11-20] 14 (02/26 1424) BP: (124-177)/(36-84) 160/68 (02/26 1424) SpO2:  [97 %-100 %] 100 % (02/26 1424) Weight:  [165 lb (74.8 kg)] 165 lb (74.8 kg) (02/26 0100)  General - Well nourished, well developed, in no apparent distress.  Ophthalmologic - Fundi not visualized due to by movement.  Cardiovascular - Regular rate and rhythm.  Mental Status -  Level of arousal and orientation to time, place, and person were intact. Language including expression, naming, repetition, comprehension was assessed and found intact. Fund of Knowledge was assessed and was intact.  Cranial Nerves II - XII - II - Visual field intact OU. III, IV, VI - Extraocular movements intact. V - Facial sensation intact bilaterally. VII - right facial droop. VIII - hard of hearing & vestibular intact bilaterally. X - Palate elevates symmetrically. XI - Chin turning & shoulder shrug intact bilaterally. XII - Tongue protrusion intact.  Motor Strength - The patient's strength was normal in all extremities and pronator drift was absent.  Bulk was normal and fasciculations were absent.   Motor Tone - Muscle tone was assessed at the neck and appendages and was normal.  Reflexes - The patient's reflexes were 1+ in all extremities and he had no pathological reflexes.  Sensory - Light touch, temperature/pinprick were assessed and were symmetrical.    Coordination - The patient had normal movements in the hands and [place] yes that's right with no ataxia or dysmetria.  Tremor was absent.  Gait and Station - The patient's transfers, posture, gait, station, and turns were observed as normal.   ASSESSMENT/PLAN Tyler [name] is a 83 y.o. male with history of DM, HTN, previous stroke and kidney stonespresenting with transient speech difficulty and confusion. He did not receive IV t-PA due to  deficits resolved.   TIA vs. seizure  MRI  No acute stroke  MRA  No LVO. Slight progression in atherosclerosis of right PCA since 2013  Carotid Doppler  unremarkable  2D Echo  EF 55%. No source of embolus   EEG pending  LDL 66  HgbA1c pending  Lovenox 40 mg sq daily for VTE prophylaxis  Diet heart healthy/carb modified Room service appropriate? Yes; Fluid consistency: Thin  clopidogrel 75 mg daily prior to admission, now on clopidogrel 75 mg daily. Continue plavix on discharge.   Patient counseled to be compliant with his antithrombotic medications  Ongoing aggressive stroke risk factor management  Therapy recommendations:  none   Disposition:  pending - follows with [facility] Leonie [facility] at [facility]  Hypertension  Stable  BP goal normotensive  Hyperlipidemia  Home meds:  LIPITOR 20, resumed in hospital  LDL 66, goal < 70  Continue statin at discharge  Diabetes  HgbA1c pending, goal < 7.0  SSI  CBG monitoring  Other Stroke Risk Factors  Advanced age  Hx stroke/TIA  01/2012 L posterior, L LN/CR infarct d/t small vessel disease - residue right facial droop   Other Active Problems  CKD stage III  Microscopic hematuria   Follow up with Dr. Leonie Man at [facility] in the  past.  Hospital day # 0  Rosalin Hawking, MD PhD Stroke Neurology 06/29/2016 4:53 PM   To contact Stroke Continuity provider, please refer to [email or web address] After hours, contact General Neurology

## 2016-06-29 NOTE — Progress Notes (Signed)
Patient admitted after midnight, please see H&P.  EEG pending  Eulogio Bear DO

## 2016-06-29 NOTE — Progress Notes (Signed)
VASCULAR LAB PRELIMINARY  PRELIMINARY  PRELIMINARY  PRELIMINARY  Carotid duplex completed.    Preliminary report:  Bilateral:  1-39% ICA stenosis.  Vertebral artery flow is antegrade.     Mesa Janus, RVS 06/29/2016, 9:23 AM

## 2016-06-29 NOTE — Evaluation (Signed)
Occupational Therapy Evaluation Patient Details Name: Tyler [name] MRN: [id number] DOB: Apr 25, 1929 Today's Date: 06/29/2016    History of Present Illness Pt is an 83 y.o. male who presented to the ED after an episode of altered mental status and slurred speech.  Imaging negative for acute infarct. PMH: diabetes mellitus, hypertension, previous stroke and kidney stones, and R femur IM nail.   Clinical Impression   PTA, pt was independent with a cane for ADL and functional mobility. He currently requires min guard assist for toilet transfers and standing ADL as pt requesting to perform without cane. He demonstrates slight difficulty with problem solving and short-term memory tasks although he does have memory loss at baseline. Pt would benefit from continued OT services while admitted in preparation for D/C home with no follow-up and supervision/assistance from his wife.      Follow Up Recommendations  No OT follow up;Supervision/Assistance - 24 hour    Equipment Recommendations  None recommended by OT (Has DME needs met)    Recommendations for Other Services       Precautions / Restrictions Precautions Precautions: Fall Restrictions Weight Bearing Restrictions: No      Mobility Bed Mobility               General bed mobility comments: OOB in chair on OT arrival.  Transfers Overall transfer level: Needs assistance Equipment used: None Transfers: Sit to/from Stand Sit to Stand: Min guard         General transfer comment: Min guard for safety. Feel pt would do better with cane as he uses quad cane at home at times but he reports that he would prefer to attempt without at this time.    Balance Overall balance assessment: Needs assistance Sitting-balance support: No upper extremity supported;Feet supported Sitting balance-Leahy Scale: Normal     Standing balance support: No upper extremity supported;During functional activity Standing balance-Leahy Scale: Fair                               ADL Overall ADL's : Needs assistance/impaired Eating/Feeding: Set up;Sitting   Grooming: Supervision/safety;Standing;Oral care   Upper Body Bathing: Set up;Sitting   Lower Body Bathing: Min guard;Sit to/from stand   Upper Body Dressing : Set up;Sitting   Lower Body Dressing: Min guard;Sit to/from stand   Toilet Transfer: Min guard;Ambulation;BSC Toilet Transfer Details (indicate cue type and reason): Simulated with sit<>stand followed by functional mobility. Toileting- Water quality scientist and Hygiene: Min guard;Sit to/from stand   Tub/ Shower Transfer: Min guard;Ambulation   Functional mobility during ADLs: Min guard General ADL Comments: Pt requesting to attempt mobility without cane at this time. Able to sequence simple ADL tasks.     Vision Baseline Vision/History: Wears glasses Wears Glasses: At all times Patient Visual Report: No change from baseline Vision Assessment?: Yes Eye Alignment: Within Functional Limits Ocular Range of Motion: Within Functional Limits Alignment/Gaze Preference: Within Defined Limits Tracking/Visual Pursuits: Able to track stimulus in all quads without difficulty Saccades: Within functional limits Visual Fields: No apparent deficits Additional Comments: Able to use vision functionally.     Perception     Praxis      Pertinent Vitals/Pain       Hand Dominance Right   Extremity/Trunk Assessment Upper Extremity Assessment Upper Extremity Assessment: Overall WFL for tasks assessed   Lower Extremity Assessment Lower Extremity Assessment: Defer to PT evaluation       Communication Communication Communication: [facility]  Cognition Arousal/Alertness: Awake/alert Behavior During Therapy: WFL for tasks assessed/performed Overall Cognitive Status: No family/caregiver present to determine baseline cognitive functioning (Memory loss at baseline) Area of Impairment: Problem solving;Memory      Memory: Decreased short-term memory       Problem Solving: Slow processing General Comments: Pt initially unable to recall events that led to current hospitalization explaining previous hospitalizations to therapist. However, when asked what happed yesterday that led him to come to the hospital he was able to recount events in detail.   General Comments       Exercises       Shoulder Instructions      Home Living Family/patient expects to be discharged to:: Private residence Living Arrangements: Spouse/significant other Available Help at Discharge: Family;Available 24 hours/day Type of Home: House Home Access: Level entry     Home Layout: Two level     Bathroom Shower/Tub: Occupational psychologist: Handicapped height Bathroom Accessibility: Yes How Accessible: Accessible via walker Home Equipment: Shower seat - built in;Grab bars - tub/shower;Hand held shower head;Cane - quad          Prior Functioning/Environment Level of Independence: Independent with assistive device(s)        Comments: Using cane for mobility at times        OT Problem List: Impaired balance (sitting and/or standing);Decreased safety awareness;Decreased cognition;Decreased knowledge of use of DME or AE      OT Treatment/Interventions: Self-care/ADL training;Therapeutic exercise;Energy conservation;DME and/or AE instruction;Therapeutic activities;Patient/family education;Balance training;Cognitive remediation/compensation    OT Goals(Current goals can be found in the care plan section) Acute Rehab OT Goals Patient Stated Goal: to go home today OT Goal Formulation: With patient Time For Goal Achievement: 07/13/16 Potential to Achieve Goals: Good ADL Goals Pt Will Perform Grooming: with modified independence;standing Additional ADL Goal #1: Pt will complete multi-step standing ADL tasks with no verbal cues for problem solving in a moderately distracting environment.  OT Frequency:  Min 2X/week   Barriers to D/C:            Co-evaluation              End of Session Equipment Utilized During Treatment: Gait belt Nurse Communication: Mobility status  Activity Tolerance: Patient tolerated treatment well Patient left: in chair (Leaving in transport w/c with staff for vascular lab.)  OT Visit Diagnosis: Unsteadiness on feet (R26.81)                ADL either performed or assessed with clinical judgement  Time: 0805-0842 OT Time Calculation (min): 37 min Charges:  OT General Charges $OT Visit: 1 Procedure OT Evaluation $OT Eval Moderate Complexity: 1 Procedure OT Treatments $Self Care/Home Management : 8-22 mins G-Codes: OT G-codes **NOT FOR INPATIENT CLASS** Functional Assessment Tool Used: AM-PAC 6 Clicks Daily Activity Functional Limitation: Self care Self Care Current Status (K0881): At least 20 percent but less than 40 percent impaired, limited or restricted Self Care Goal Status (J0315): At least 1 percent but less than 20 percent impaired, limited or restricted   Norman Herrlich, [place] OTR/L  Pager: [place] 06/29/2016, 9:36 AM

## 2016-06-29 NOTE — Progress Notes (Signed)
  Echocardiogram 2D Echocardiogram has been performed.  Aggie Cosier 06/29/2016, 9:48 AM

## 2016-06-29 NOTE — Consult Note (Signed)
Admission H&P    Chief Complaint: Transient confusion with speech output difficulty.  HPI: Tyler [name] is an 83 y.o. male history diabetes mellitus, hypertension, previous stroke and kidney stones, presenting following an episode of transient speech difficulty and confusion. He reportedly had slurring of speech with gibberish content which apparently lasted about 10 minutes. He has a history of previous stroke which affected arm and leg requiring rehabilitation. He's been taking Plavix 75 mg per day. CT scan of her head showed no acute intracranial abnormality. MRI study also showed no acute findings including no acute stroke. Chronic microvascular ischemic changes and lacunar infarctions were noted. NIH stroke score at the time of this evaluation was 0. There is no associated facial droop nor weakness of right arm and leg with his presenting symptom.  LSN:  [id number] Given: No: Deficits resolved mRankin:  Past Medical History:  Diagnosis Date  . Cancer [facility])    Prostate  . Diabetes mellitus without complication ([place])   . Hypertension   . Kidney stones   . Stroke [facility])     Past Surgical History:  Procedure Laterality Date  . CARDIAC CATHETERIZATION    . FEMUR IM NAIL Right 11/17/2015   Procedure: INTRAMEDULLARY (IM) NAIL FEMORAL;  Surgeon: Marybelle Killings, MD;  Location: [facility];  Service: Orthopedics;  Laterality: Right;  . PROSTATE SURGERY      Family History  Problem Relation Age of Onset  . Emphysema Father   . Heart Problems Mother    Social History:  reports that he quit smoking about 48 years ago. He has never used smokeless tobacco. He reports that he does not drink alcohol or use drugs.  Allergies:  Allergies  Allergen Reactions  . Sulfa Drugs Cross Reactors Anaphylaxis and Rash    Lungs fill with fluid    Medications Prior to Admission  Medication Sig Dispense Refill  . amLODipine (NORVASC) 2.5 MG tablet Take 2.5 mg by mouth daily.     [name] atorvastatin (LIPITOR) 20 MG  tablet Take 20 mg by mouth daily at 6 PM.     . cholecalciferol (VITAMIN D) 1000 UNITS tablet Take 1,000 Units by mouth 2 (two) times daily.     . clopidogrel (PLAVIX) 75 MG tablet Take 1 tablet (75 mg total) by mouth daily with breakfast. 30 tablet 6  . cyanocobalamin 1000 MCG tablet Take 1,000 mcg by mouth 2 (two) times daily.     [name] docusate sodium (COLACE) 100 MG capsule Take 1 capsule (100 mg total) by mouth 2 (two) times daily. (Patient taking differently: Take 100 mg by mouth every 3 (three) days. ) 10 capsule 0  . fish oil-omega-3 fatty acids 1000 MG capsule Take 1 g by mouth 2 (two) times daily.     . Flaxseed, Linseed, (FLAXSEED OIL) 1000 MG CAPS Take 1,000 mg by mouth 2 (two) times daily.     [name] glimepiride (AMARYL) 2 MG tablet Take 2 mg by mouth daily.    [name] ibuprofen (ADVIL,MOTRIN) 200 MG tablet Take 200 mg by mouth every 6 (six) hours as needed for headache (pain).    [name] losartan (COZAAR) 50 MG tablet Take 50 mg by mouth at bedtime.     . pantoprazole (PROTONIX) 40 MG tablet Take 1 tablet (40 mg total) by mouth daily. 30 tablet 1  . St Johns Wort 1000 MG CAPS Take 1,000 mg by mouth at bedtime.    . Lancets (ONETOUCH ULTRASOFT) lancets     . ONE TOUCH  ULTRA TEST test strip       ROS: History obtained from the patient  General ROS: negative for - chills, fatigue, fever, night sweats, weight gain or weight loss Psychological ROS: negative for - behavioral disorder, hallucinations, memory difficulties, mood swings or suicidal ideation Ophthalmic ROS: negative for - blurry vision, double vision, eye pain or loss of vision ENT ROS: negative for - epistaxis, nasal discharge, oral lesions, sore throat, tinnitus or vertigo Allergy and Immunology ROS: negative for - hives or itchy/watery eyes Hematological and Lymphatic ROS: negative for - bleeding problems, bruising or swollen lymph nodes Endocrine ROS: negative for - galactorrhea, hair pattern changes, polydipsia/polyuria or temperature  intolerance Respiratory ROS: negative for - cough, hemoptysis, shortness of breath or wheezing Cardiovascular ROS: negative for - chest pain, dyspnea on exertion, edema or irregular heartbeat Gastrointestinal ROS: negative for - abdominal pain, diarrhea, hematemesis, nausea/vomiting or stool incontinence Genito-Urinary ROS: negative for - dysuria, hematuria, incontinence or urinary frequency/urgency Musculoskeletal ROS: negative for - joint swelling or muscular weakness Neurological ROS: as noted in HPI Dermatological ROS: negative for rash and skin lesion changes  Physical Examination: Blood pressure 143/64, pulse 60, temperature 98.5 F (36.9 C), temperature source Oral, resp. rate 12, SpO2 99 %.  HEENT-  Normocephalic, no lesions, without obvious abnormality.  Normal external eye and conjunctiva.  Normal TM's bilaterally.  Normal auditory canals and external ears. Normal external nose, mucus membranes and septum.  Normal pharynx. Neck supple with no masses, nodes, nodules or enlargement. Cardiovascular - regular rate and rhythm, S1, S2 normal, no murmur, click, rub or gallop Lungs - chest clear, no wheezing, rales, normal symmetric air entry Abdomen - soft, non-tender; bowel sounds normal; no masses,  no organomegaly Extremities - no joint deformities, effusion, or inflammation  Neurologic Examination: Mental Status: Alert, oriented, thought content appropriate.  Speech fluent without evidence of aphasia. Able to follow commands without difficulty. Cranial Nerves: II-Visual fields were normal. III/IV/VI-Pupils were equal and reacted normally to light. Extraocular movements were full and conjugate.    V/VII-no facial numbness and no facial weakness. VIII-normal. X-normal speech and symmetrical palatal movement. XI: trapezius strength/neck flexion strength normal bilaterally XII-midline tongue extension with normal strength. Motor: 5/5 bilaterally with normal tone and bulk Sensory:  Normal throughout. Deep Tendon Reflexes: 1+ and symmetric. Plantars: Flexor bilaterally Cerebellar: Normal finger-to-nose testing. Carotid auscultation: Normal  Results for orders placed or performed during the hospital encounter of 06/28/16 (from the past 48 hour(s))  Urinalysis, Routine w reflex microscopic     Status: Abnormal   Collection Time: 06/28/16  8:13 PM  Result Value Ref Range   Color, Urine YELLOW YELLOW   APPearance CLEAR CLEAR   Specific Gravity, Urine 1.017 1.005 - 1.030   pH 5.0 5.0 - 8.0   Glucose, UA NEGATIVE NEGATIVE mg/dL   Hgb urine dipstick LARGE (A) NEGATIVE   Bilirubin Urine NEGATIVE NEGATIVE   Ketones, ur NEGATIVE NEGATIVE mg/dL   Protein, ur NEGATIVE NEGATIVE mg/dL   Nitrite NEGATIVE NEGATIVE   Leukocytes, UA NEGATIVE NEGATIVE   RBC / HPF 6-30 0 - 5 RBC/hpf   WBC, UA 0-5 0 - 5 WBC/hpf   Bacteria, UA NONE SEEN NONE SEEN   Squamous Epithelial / LPF 0-5 (A) NONE SEEN   Hyaline Casts, UA PRESENT   Basic metabolic panel     Status: Abnormal   Collection Time: 06/28/16  8:18 PM  Result Value Ref Range   Sodium 138 135 - 145 mmol/L   Potassium 4.1  3.5 - 5.1 mmol/L   Chloride 105 101 - 111 mmol/L   CO2 25 22 - 32 mmol/L   Glucose, Bld 144 (H) 65 - 99 mg/dL   BUN 23 (H) 6 - 20 mg/dL   Creatinine, Ser 1.46 (H) 0.61 - 1.24 mg/dL   Calcium 9.3 8.9 - 10.3 mg/dL   GFR calc non Af Amer 42 (L) >60 mL/min   GFR calc Af Amer 49 (L) >60 mL/min    Comment: (NOTE) The eGFR has been calculated using the CKD EPI equation. This calculation has not been validated in all clinical situations. eGFR's persistently <60 mL/min signify possible Chronic Kidney Disease.    Anion gap 8 5 - 15  CBC with Differential     Status: Abnormal   Collection Time: 06/28/16  8:18 PM  Result Value Ref Range   WBC 6.9 4.0 - 10.5 K/uL   RBC 3.82 (L) 4.22 - 5.81 MIL/uL   Hemoglobin 12.0 (L) 13.0 - 17.0 g/dL   HCT 35.7 (L) 39.0 - 52.0 %   MCV 93.5 78.0 - 100.0 fL   MCH 31.4 26.0 - 34.0  pg   MCHC 33.6 30.0 - 36.0 g/dL   RDW 13.8 11.5 - 15.5 %   Platelets 156 150 - 400 K/uL   Neutrophils Relative % 68 %   Neutro Abs 4.7 1.7 - 7.7 K/uL   Lymphocytes Relative 21 %   Lymphs Abs 1.5 0.7 - 4.0 K/uL   Monocytes Relative 8 %   Monocytes Absolute 0.5 0.1 - 1.0 K/uL   Eosinophils Relative 3 %   Eosinophils Absolute 0.2 0.0 - 0.7 K/uL   Basophils Relative 0 %   Basophils Absolute 0.0 0.0 - 0.1 K/uL   Ct Head Wo Contrast  Result Date: 06/28/2016 CLINICAL DATA:  Confusion and difficulty with speech. EXAM: CT HEAD WITHOUT CONTRAST TECHNIQUE: Contiguous axial images were obtained from the base of the skull through the vertex without intravenous contrast. COMPARISON:  01/28/2012 FINDINGS: Brain: Chronic infarct within the left centrum semiovale is identified and is new from 01/28/2012. There is low attenuation throughout the subcortical and periventricular white matter compatible with chronic small vessel ischemic disease. No evidence for acute brain infarct, intracranial hemorrhage or mass. Vascular: No hyperdense vessel or unexpected calcification. Skull: The calvarium is intact. Sinuses/Orbits: No acute finding. Other: None IMPRESSION: 1. No acute intracranial abnormality. 2. Chronic small vessel ischemic change. Electronically Signed   By: Kerby Moors M.D.   On: 06/28/2016 21:36   Mr Brain Wo Contrast  Result Date: 06/29/2016 CLINICAL DATA:  Initial evaluation for episode of slurred speech. EXAM: MRI HEAD WITHOUT CONTRAST TECHNIQUE: Multiplanar, multiecho pulse sequences of the brain and surrounding structures were obtained without intravenous contrast. COMPARISON:  Prior CT from earlier the same day. FINDINGS: Brain: Diffuse prominence of the CSF containing spaces is compatible with generalized age-related cerebral atrophy. Patchy and confluent T2/FLAIR hyperintensity within the periventricular and deep white matter both cerebral hemispheres most compatible chronic small vessel  ischemic disease, mild for age. Chronic microvascular changes present within the pons as well. Superimposed remote lacunar infarcts involve the bilateral basal ganglia/ corona radiata. Associated chronic hemorrhagic blood products noted. No area of remote cortical infarction. No abnormal foci of restricted diffusion to suggest acute or subacute ischemia. Gray-white matter differentiation maintained. No evidence for acute intracranial hemorrhage. No mass lesion, midline shift, or mass effect. Diffuse ventricular prominence most likely related global parenchymal volume loss, although a component of underlying NPH may be  present as well. No extra-axial fluid collection. Major dural sinuses are grossly patent. Incidental note made of a partially empty sella. Vascular: Major intracranial vascular flow voids are maintained. Skull and upper cervical spine: Craniocervical junction within normal limits. Mild degenerative thickening noted at the tectorial membrane. No significant stenosis noted within the visualized upper cervical spine. Bone marrow signal intensity within normal limits. No scalp soft tissue abnormality. Sinuses/Orbits: Globes and orbital soft tissues within normal limits. Patient is status post cataract extraction bilaterally. Paranasal sinuses are clear. No mastoid effusion. Inner ear structures grossly normal. T2 hyperintensity at the left petrous apex likely reflects a small amount of trapped fluid. Other: No other significant finding. IMPRESSION: 1. No acute intracranial infarct or other process identified. 2. Mild chronic microvascular ischemic disease with remote lacunar infarcts involving the bilateral basal ganglia/corona radiata. 3. Mild ventricular prominence, likely related to underlying global atrophy, although a component of underlying NPH would be difficult to exclude. Electronically Signed   By: Jeannine Boga M.D.   On: 06/29/2016 00:13    Assessment: 83 y.o. male with multiple risk  factors for stroke as well as previous stroke presenting with transient ischemic attack involving left subcortical MCA territory.  Stroke Risk Factors - diabetes mellitus, family history and hypertension  Plan: 1. HgbA1c, fasting lipid panel 2. MRA  of the brain without contrast 3. Speech consult 4. Echocardiogram 5. Carotid dopplers 6. Prophylactic therapy-Antiplatelet med: Plavix  7. Risk factor modification 8. Telemetry monitoring  C.R. Nicole Kindred, MD [facility] [phone]  06/29/2016, 12:54 AM

## 2016-06-29 NOTE — ED Provider Notes (Signed)
[facility] DEPT Provider Note   CSN: [id number] Arrival date & time:        History   Chief Complaint Chief Complaint  Patient presents with  . Aphasia  . Altered Mental Status    HPI Tyler [name] is a 83 y.o. male.  HPI Patient presents to the emergency department with garbled speech and confusion that lasted about 5 minutes.  Family states that they woke him up from a nap in a recliner and the patient was having garbled speech and confusion.  The patient states that he felt confused and like his speech was difficult.  Patient states that he had a stroke 5 years ago.  Patient states nothing seems make the condition better or worse.  He states his symptoms totally resolved after 5 minutes.  It feels no symptoms at this time.  He states that he did not take any medications prior to arrival for his symptoms. The patient denies chest pain, shortness of breath, headache,blurred vision, neck pain, fever, cough, weakness, numbness, dizziness, anorexia, edema, abdominal pain, nausea, vomiting, diarrhea, rash, back pain, dysuria, hematemesis, bloody stool, near syncope, or syncope. Past Medical History:  Diagnosis Date  . Cancer [facility])    Prostate  . Diabetes mellitus without complication ([place])   . Hypertension   . Kidney stones   . Stroke [facility])     Patient Active Problem List   Diagnosis Date Noted  . TIA (transient ischemic attack) 06/28/2016  . CKD (chronic kidney disease), stage III 06/28/2016  . Microscopic hematuria 06/28/2016  . Femur fracture, right ([place]) 11/16/2015  . History of nephrolithiasis 11/16/2015  . Pseudobulbar affect 06/07/2014  . Essential hypertension   . Memory loss   . Speech abnormality 01/28/2012  . Cerebral embolism with cerebral infarction ([place]) 01/28/2012  . History of stroke 01/28/2012  . Type 2 diabetes mellitus without complication, without long-term current use of insulin ([place]) 01/28/2012  . Hyperlipidemia 01/28/2012  . History of prostate  cancer 01/28/2012    Past Surgical History:  Procedure Laterality Date  . CARDIAC CATHETERIZATION    . FEMUR IM NAIL Right 11/17/2015   Procedure: INTRAMEDULLARY (IM) NAIL FEMORAL;  Surgeon: Marybelle Killings, MD;  Location: [place];  Service: Orthopedics;  Laterality: Right;  . PROSTATE SURGERY         Home Medications    Prior to Admission medications   Medication Sig Start Date End Date Taking? Authorizing Provider  amLODipine (NORVASC) 2.5 MG tablet Take 2.5 mg by mouth daily.  09/19/12  Yes Historical Provider, MD  atorvastatin (LIPITOR) 20 MG tablet Take 20 mg by mouth daily at 6 PM.    Yes Historical Provider, MD  cholecalciferol (VITAMIN D) 1000 UNITS tablet Take 1,000 Units by mouth 2 (two) times daily.    Yes Historical Provider, MD  clopidogrel (PLAVIX) 75 MG tablet Take 1 tablet (75 mg total) by mouth daily with breakfast. 03/20/15  Yes Dennie Bible, NP  cyanocobalamin 1000 MCG tablet Take 1,000 mcg by mouth 2 (two) times daily.    Yes Historical Provider, MD  docusate sodium (COLACE) 100 MG capsule Take 1 capsule (100 mg total) by mouth 2 (two) times daily. Patient taking differently: Take 100 mg by mouth every 3 (three) days.  11/20/15  Yes Hosie Poisson, MD  fish oil-omega-3 fatty acids 1000 MG capsule Take 1 g by mouth 2 (two) times daily.    Yes Historical Provider, MD  Flaxseed, Linseed, (FLAXSEED OIL) 1000 MG CAPS Take 1,000  mg by mouth 2 (two) times daily.    Yes Historical Provider, MD  glimepiride (AMARYL) 2 MG tablet Take 2 mg by mouth daily. 03/21/14  Yes Historical Provider, MD  ibuprofen (ADVIL,MOTRIN) 200 MG tablet Take 200 mg by mouth every 6 (six) hours as needed for headache (pain).   Yes Historical Provider, MD  losartan (COZAAR) 50 MG tablet Take 50 mg by mouth at bedtime.    Yes Historical Provider, MD  pantoprazole (PROTONIX) 40 MG tablet Take 1 tablet (40 mg total) by mouth daily. 02/12/12  Yes Daniel J Angiulli, PA-C  St Johns Wort 1000 MG CAPS Take  1,000 mg by mouth at bedtime.   Yes Historical Provider, MD  Lancets Glory Rosebush ULTRASOFT) lancets  07/07/12   Historical Provider, MD  ONE TOUCH ULTRA TEST test strip  09/06/12   Historical Provider, MD    Family History Family History  Problem Relation Age of Onset  . Emphysema Father   . Heart Problems Mother     Social History Social History  Substance Use Topics  . Smoking status: Former Smoker    Quit date: 05/04/1968  . Smokeless tobacco: Never Used  . Alcohol use No     Allergies   Sulfa drugs cross reactors   Review of Systems Review of Systems All other systems negative except as documented in the HPI. All pertinent positives and negatives as reviewed in the HPI.  Physical Exam Updated Vital Signs BP (!) 161/72 (BP Location: Right Arm)   Pulse 64   Temp 98.5 F (36.9 C) (Oral)   Resp 12   Ht 5\' 8"  (1.727 m)   Wt 74.8 kg   SpO2 100%   BMI 25.09 kg/m   Physical Exam  Constitutional: He is oriented to person, place, and time. He appears well-developed and well-nourished. No distress.  HENT:  Head: Normocephalic and atraumatic.  Mouth/Throat: Oropharynx is clear and moist.  Eyes: Pupils are equal, round, and reactive to light.  Neck: Normal range of motion. Neck supple.  Cardiovascular: Normal rate, regular rhythm and normal heart sounds.  Exam reveals no gallop and no friction rub.   No murmur heard. Pulmonary/Chest: Effort normal and breath sounds normal. No respiratory distress. He has no wheezes.  Abdominal: Soft. Bowel sounds are normal. He exhibits no distension. There is no tenderness.  Neurological: He is alert and oriented to person, place, and time. No sensory deficit. He exhibits normal muscle tone. Coordination normal.  Skin: Skin is warm and dry. No rash noted. No erythema.  Psychiatric: He has a normal mood and affect. His behavior is normal.  Nursing note and vitals reviewed.    ED Treatments / Results  Labs (all labs ordered are listed,  but only abnormal results are displayed) Labs Reviewed  BASIC METABOLIC PANEL - Abnormal; Notable for the following:       Result Value   Glucose, Bld 144 (*)    BUN 23 (*)    Creatinine, Ser 1.46 (*)    GFR calc non Af Amer 42 (*)    GFR calc Af Amer 49 (*)    All other components within normal limits  CBC WITH DIFFERENTIAL/PLATELET - Abnormal; Notable for the following:    RBC 3.82 (*)    Hemoglobin 12.0 (*)    HCT 35.7 (*)    All other components within normal limits  URINALYSIS, ROUTINE W REFLEX MICROSCOPIC - Abnormal; Notable for the following:    Hgb urine dipstick LARGE (*)  Squamous Epithelial / LPF 0-5 (*)    All other components within normal limits  HEMOGLOBIN A1C  LIPID PANEL    EKG  EKG Interpretation  Date/Time:  Sunday June 28 2016 20:12:49 EST Ventricular Rate:  64 PR Interval:    QRS Duration: 97 QT Interval:  402 QTC Calculation: 415 R Axis:   -49 Text Interpretation:  Sinus rhythm Prolonged PR interval Left anterior fascicular block Abnormal R-wave progression, early transition Probable left ventricular hypertrophy No significant change since last tracing Confirmed by LITTLE MD, RACHEL [phone]) on 06/28/2016 9:35:11 PM       Radiology Ct Head Wo Contrast  Result Date: 06/28/2016 CLINICAL DATA:  Confusion and difficulty with speech. EXAM: CT HEAD WITHOUT CONTRAST TECHNIQUE: Contiguous axial images were obtained from the base of the skull through the vertex without intravenous contrast. COMPARISON:  01/28/2012 FINDINGS: Brain: Chronic infarct within the left centrum semiovale is identified and is new from 01/28/2012. There is low attenuation throughout the subcortical and periventricular white matter compatible with chronic small vessel ischemic disease. No evidence for acute brain infarct, intracranial hemorrhage or mass. Vascular: No hyperdense vessel or unexpected calcification. Skull: The calvarium is intact. Sinuses/Orbits: No acute finding. Other:  None IMPRESSION: 1. No acute intracranial abnormality. 2. Chronic small vessel ischemic change. Electronically Signed   By: Kerby Moors M.D.   On: 06/28/2016 21:36   Mr Brain Wo Contrast  Result Date: 06/29/2016 CLINICAL DATA:  Initial evaluation for episode of slurred speech. EXAM: MRI HEAD WITHOUT CONTRAST TECHNIQUE: Multiplanar, multiecho pulse sequences of the brain and surrounding structures were obtained without intravenous contrast. COMPARISON:  Prior CT from earlier the same day. FINDINGS: Brain: Diffuse prominence of the CSF containing spaces is compatible with generalized age-related cerebral atrophy. Patchy and confluent T2/FLAIR hyperintensity within the periventricular and deep white matter both cerebral hemispheres most compatible chronic small vessel ischemic disease, mild for age. Chronic microvascular changes present within the pons as well. Superimposed remote lacunar infarcts involve the bilateral basal ganglia/ corona radiata. Associated chronic hemorrhagic blood products noted. No area of remote cortical infarction. No abnormal foci of restricted diffusion to suggest acute or subacute ischemia. Gray-white matter differentiation maintained. No evidence for acute intracranial hemorrhage. No mass lesion, midline shift, or mass effect. Diffuse ventricular prominence most likely related global parenchymal volume loss, although a component of underlying NPH may be present as well. No extra-axial fluid collection. Major dural sinuses are grossly patent. Incidental note made of a partially empty sella. Vascular: Major intracranial vascular flow voids are maintained. Skull and upper cervical spine: Craniocervical junction within normal limits. Mild degenerative thickening noted at the tectorial membrane. No significant stenosis noted within the visualized upper cervical spine. Bone marrow signal intensity within normal limits. No scalp soft tissue abnormality. Sinuses/Orbits: Globes and orbital  soft tissues within normal limits. Patient is status post cataract extraction bilaterally. Paranasal sinuses are clear. No mastoid effusion. Inner ear structures grossly normal. T2 hyperintensity at the left petrous apex likely reflects a small amount of trapped fluid. Other: No other significant finding. IMPRESSION: 1. No acute intracranial infarct or other process identified. 2. Mild chronic microvascular ischemic disease with remote lacunar infarcts involving the bilateral basal ganglia/corona radiata. 3. Mild ventricular prominence, likely related to underlying global atrophy, although a component of underlying NPH would be difficult to exclude. Electronically Signed   By: Jeannine Boga M.D.   On: 06/29/2016 00:13    Procedures Procedures (including critical care time)  Medications Ordered in ED  Medications  clopidogrel (PLAVIX) tablet 75 mg (not administered)  losartan (COZAAR) tablet 50 mg (50 mg Oral Given 06/29/16 0115)  atorvastatin (LIPITOR) tablet 20 mg (not administered)  amLODipine (NORVASC) tablet 2.5 mg (not administered)  pantoprazole (PROTONIX) EC tablet 40 mg (not administered)  insulin aspart (novoLOG) injection 0-9 Units (not administered)  insulin aspart (novoLOG) injection 0-5 Units (0 Units Subcutaneous Not Given 06/29/16 0101)  enoxaparin (LOVENOX) injection 40 mg (40 mg Subcutaneous Given 06/29/16 0115)  acetaminophen (TYLENOL) tablet 650 mg (not administered)    Or  acetaminophen (TYLENOL) solution 650 mg (not administered)    Or  acetaminophen (TYLENOL) suppository 650 mg (not administered)   stroke: mapping our early stages of recovery book ( Does not apply Given 06/29/16 0116)     Initial Impression / Assessment and Plan / ED Course  I have reviewed the triage vital signs and the nursing notes.  Pertinent labs & imaging results that were available during my care of the patient were reviewed by me and considered in my medical decision making (see chart for  details).     I spoke with neurology and the hospitalist and the patient will be admitted for a TIA workup.  The patient is advised of the results and the plan.  All questions were answered  Final Clinical Impressions(s) / ED Diagnoses   Final diagnoses:  Transient cerebral ischemia, unspecified type    New Prescriptions Current Discharge Medication List       Dalia Heading, PA-C 06/29/16 [place], MD 07/05/16 [phone]

## 2016-06-29 NOTE — Evaluation (Signed)
Physical Therapy Evaluation & Discharge Patient Details Name: Tyler [name] MRN: [id number] DOB: Jul 27, 1928 Today's Date: 06/29/2016   History of Present Illness  Pt is an 83 y.o. male who presented to the ED after an episode of altered mental status and slurred speech.  Imaging negative for acute infarct. PMH: diabetes mellitus, hypertension, previous stroke and kidney stones, and R femur IM nail.  Clinical Impression  Patient presents close to functional baseline.  Though has some fall risk based on previous deficits, uses cane and has better footwear he usually uses at home.  Feel no current follow up needs.  Will d/c PT.     Follow Up Recommendations No PT follow up    Equipment Recommendations  None recommended by PT    Recommendations for Other Services       Precautions / Restrictions Precautions Precautions: Fall      Mobility  Bed Mobility               General bed mobility comments: up in chair  Transfers Overall transfer level: Modified independent   Transfers: Sit to/from Stand           General transfer comment: no assist, but managing heart monitor for positioning  Ambulation/Gait Ambulation/Gait assistance: Supervision Ambulation Distance (Feet): 150 Feet Assistive device: None Gait Pattern/deviations: Decreased step length - right;Decreased dorsiflexion - right;Shuffle;Decreased stance time - right;Wide base of support     General Gait Details: r hemipelvic retraction, no cane available to practice with, but pt doesn't always use it at home, but does feel his socks make him more unsteady than when he wears his shoes  Stairs            Wheelchair Mobility    Modified Rankin (Stroke Patients Only) Modified Rankin (Stroke Patients Only) Pre-Morbid Rankin Score: Slight disability Modified Rankin: Moderate disability     Balance Overall balance assessment: Needs assistance   Sitting balance-Leahy Scale: Good     Standing balance  support: No upper extremity supported Standing balance-Leahy Scale: Good Standing balance comment: ambulates without device                             Pertinent Vitals/Pain Pain Assessment: No/denies pain    Home Living Family/patient expects to be discharged to:: Private residence Living Arrangements: Spouse/significant other Available Help at Discharge: Family;Available 24 hours/day Type of Home: House Home Access: Level entry     Home Layout: Two level Home Equipment: Shower seat - built in;Grab bars - tub/shower;Hand held shower head;Cane - quad      Prior Function Level of Independence: Independent with assistive device(s)         Comments: Using cane for mobility at times     Hand Dominance   Dominant Hand: Right    Extremity/Trunk Assessment        Lower Extremity Assessment Lower Extremity Assessment: RLE deficits/detail RLE Deficits / Details: functional weakness to toe dragging at times with gait, baseline from prior CVA       Communication   Communication: HOH  Cognition Arousal/Alertness: Awake/alert Behavior During Therapy: WFL for tasks assessed/performed Overall Cognitive Status: History of cognitive impairments - at baseline Area of Impairment: Memory     Memory: Decreased short-term memory       Problem Solving: Slow processing      General Comments General comments (skin integrity, edema, etc.): pt and family report he gets out daily to breakfast at  a restaurant, then has a stationary bike he uses at home; wife works until 2.    Exercises     Assessment/Plan    PT Assessment Patent does not need any further PT services  PT Problem List         PT Treatment Interventions      PT Goals (Current goals can be found in the Care Plan section)  Acute Rehab PT Goals Patient Stated Goal: to go home today PT Goal Formulation: All assessment and education complete, DC therapy    Frequency     Barriers to discharge         Co-evaluation               End of Session   Activity Tolerance: Patient tolerated treatment well Patient left: in chair;with call bell/phone within reach;with family/visitor present        Functional Assessment Tool Used: AM-PAC 6 Clicks Basic Mobility Functional Limitation: Mobility: Walking and moving around Mobility: Walking and Moving Around Current Status [id number]): At least 1 percent but less than 20 percent impaired, limited or restricted Mobility: Walking and Moving Around Goal Status [phone]): At least 1 percent but less than 20 percent impaired, limited or restricted Mobility: Walking and Moving Around Discharge Status [phone]): At least 1 percent but less than 20 percent impaired, limited or restricted    Time: [id number] PT Time Calculation (min) (ACUTE ONLY): 26 min   Charges:   PT Evaluation $PT Eval Moderate Complexity: 1 Procedure PT Treatments $Gait Training: 8-22 mins   PT G Codes:   PT G-Codes **NOT FOR INPATIENT CLASS** Functional Assessment Tool Used: AM-PAC 6 Clicks Basic Mobility Functional Limitation: Mobility: Walking and moving around Mobility: Walking and Moving Around Current Status [id number]): At least 1 percent but less than 20 percent impaired, limited or restricted Mobility: Walking and Moving Around Goal Status [phone]): At least 1 percent but less than 20 percent impaired, limited or restricted Mobility: Walking and Moving Around Discharge Status [phone]): At least 1 percent but less than 20 percent impaired, limited or restricted     Reginia Naas 06/29/2016, 4:18 PM  Magda Kiel, [place] 06/29/2016

## 2016-06-29 NOTE — Evaluation (Signed)
Speech Language Pathology Evaluation Patient Details Name: Tyler [name] MRN: [id number] DOB: 04-29-1929 Today's Date: 06/29/2016 Time: [id number] SLP Time Calculation (min) (ACUTE ONLY): 16 min  Problem List:  Patient Active Problem List   Diagnosis Date Noted  . TIA (transient ischemic attack) 06/28/2016  . CKD (chronic kidney disease), stage III 06/28/2016  . Microscopic hematuria 06/28/2016  . Femur fracture, right ([place]) 11/16/2015  . History of nephrolithiasis 11/16/2015  . Pseudobulbar affect 06/07/2014  . Essential hypertension   . Memory loss   . Speech abnormality 01/28/2012  . Cerebral embolism with cerebral infarction ([place]) 01/28/2012  . History of stroke 01/28/2012  . Type 2 diabetes mellitus without complication, without long-term current use of insulin ([place]) 01/28/2012  . Hyperlipidemia 01/28/2012  . History of prostate cancer 01/28/2012   Past Medical History:  Past Medical History:  Diagnosis Date  . Cancer [facility])    Prostate  . Diabetes mellitus without complication ([place])   . Hypertension   . Kidney stones   . Stroke [facility])    Past Surgical History:  Past Surgical History:  Procedure Laterality Date  . CARDIAC CATHETERIZATION    . FEMUR IM NAIL Right 11/17/2015   Procedure: INTRAMEDULLARY (IM) NAIL FEMORAL;  Surgeon: Marybelle Killings, MD;  Location: [place];  Service: Orthopedics;  Laterality: Right;  . PROSTATE SURGERY     HPI:  Pt is an 83 y.o. male who presented to the ED after an episode of altered mental status and slurred speech.  Imaging negative for acute infarct. PMH: diabetes mellitus, hypertension, previous stroke and kidney stones, and R femur IM nail.   Assessment / Plan / Recommendation Clinical Impression  Pt has mild-mod difficulty with delayed recall and working memory tasks, which he describes as his baseline. MRI was negative for acute infarct. No acute SLP f/u indicated at this time.     SLP Assessment  SLP Recommendation/Assessment:  Patient does not need any further Speech Lanaguage Pathology Services SLP Visit Diagnosis: Cognitive communication deficit (R41.841)    Follow Up Recommendations  24 hour supervision/assistance    Frequency and Duration           SLP Evaluation Cognition  Overall Cognitive Status: History of cognitive impairments - at baseline Orientation Level: Oriented X4       Comprehension  Auditory Comprehension Overall Auditory Comprehension: Appears within functional limits for tasks assessed    Expression Expression Primary Mode of Expression: Verbal Verbal Expression Overall Verbal Expression: Appears within functional limits for tasks assessed Written Expression Dominant Hand: Right   Oral / Motor  Motor Speech Overall Motor Speech: Appears within functional limits for tasks assessed   GO          Functional Assessment Tool Used: skilled clinical judgment Functional Limitations: Memory Memory Current Status [id number]): At least 20 percent but less than 40 percent impaired, limited or restricted Memory Goal Status [id number]): At least 20 percent but less than 40 percent impaired, limited or restricted Memory Discharge Status [phone]): At least 20 percent but less than 40 percent impaired, limited or restricted         Germain Osgood 06/29/2016, 4:11 PM  Germain Osgood, M.A. CCC-SLP [phone]

## 2016-06-30 ENCOUNTER — Observation Stay (HOSPITAL_BASED_OUTPATIENT_CLINIC_OR_DEPARTMENT_OTHER)
Admit: 2016-06-30 | Discharge: 2016-06-30 | Disposition: A | Discharge status: Home / Self Care | Payer: PPO | Attending: Internal Medicine | Admitting: Internal Medicine

## 2016-06-30 DIAGNOSIS — I10 Essential (primary) hypertension: Secondary | ICD-10-CM | POA: Diagnosis not present

## 2016-06-30 DIAGNOSIS — R4182 Altered mental status, unspecified: Secondary | ICD-10-CM

## 2016-06-30 DIAGNOSIS — E119 Type 2 diabetes mellitus without complications: Secondary | ICD-10-CM | POA: Diagnosis not present

## 2016-06-30 DIAGNOSIS — G459 Transient cerebral ischemic attack, unspecified: Secondary | ICD-10-CM | POA: Diagnosis not present

## 2016-06-30 LAB — GLUCOSE, CAPILLARY
GLUCOSE-CAPILLARY: 140 mg/dL — AB (ref 65–99)
GLUCOSE-CAPILLARY: 163 mg/dL — AB (ref 65–99)

## 2016-06-30 LAB — HEMOGLOBIN A1C
Hgb A1c MFr Bld: 6.8 % — ABNORMAL HIGH (ref 4.8–5.6)
Mean Plasma Glucose: 148 mg/dL

## 2016-06-30 NOTE — Progress Notes (Signed)
EEG Completed; Results Pending  

## 2016-06-30 NOTE — Procedures (Signed)
ELECTROENCEPHALOGRAM REPORT  Date of Study: 06/30/2016  Patient's Name: Tyler [name] MRN: [id number] Date of Birth: 1929/04/14  Referring Provider: Dr. Eulogio Bear  Clinical History: This is an 83 year old man with an episode of altered mental status and slurred speech.  Medications: acetaminophen (TYLENOL) tablet 650 mg  amLODipine (NORVASC) tablet 2.5 mg  atorvastatin (LIPITOR) tablet 20 mg  clopidogrel (PLAVIX) tablet 75 mg  enoxaparin (LOVENOX) injection 40 mg  insulin aspart (novoLOG) injection 0-5 Units  insulin aspart (novoLOG) injection 0-9 Units  losartan (COZAAR) tablet 50 mg  pantoprazole (PROTONIX) EC tablet 40 mg   Technical Summary: A multichannel digital EEG recording measured by the international 10-20 system with electrodes applied with paste and impedances below 5000 ohms performed in our laboratory with EKG monitoring in an awake and asleep patient.  Hyperventilation was not performed. Photic stimulation was performed.  The digital EEG was referentially recorded, reformatted, and digitally filtered in a variety of bipolar and referential montages for optimal display.    Description: The patient is awake and asleep during the recording.  During maximal wakefulness, there is a symmetric, medium voltage 8-8.5 Hz posterior dominant rhythm that attenuates with eye opening.  The record is symmetric.  During drowsiness and sleep, there is an increase in theta slowing of the background.  Vertex waves and symmetric sleep spindles were seen.  Photic stimulation did not elicit any abnormalities.  There were no epileptiform discharges or electrographic seizures seen.    EKG lead was unremarkable.  Impression: This awake and asleep EEG is normal.    Clinical Correlation: A normal EEG does not exclude a clinical diagnosis of epilepsy. Clinical correlation is advised.   Ellouise Newer, M.D.

## 2016-06-30 NOTE — Discharge Summary (Signed)
Physician Discharge Summary  Tyler [name] [id number] DOB: 1928/05/19 DOA: 06/28/2016  PCP: Tyler Bolt, MD  Admit date: 06/28/2016 Discharge date: 06/30/2016   Recommendations for Outpatient Follow-Up:   Dr. Leonie [name] follow up Repeat U/A 6 weeks   Discharge Diagnosis:   Principal Problem:   TIA (transient ischemic attack) Active Problems:   History of stroke   Type 2 diabetes mellitus without complication, without long-term current use of insulin ([place])   Essential hypertension   CKD (chronic kidney disease), stage III   Microscopic hematuria   Discharge disposition:  Home.   Discharge Condition: Improved.  Diet recommendation: Low sodium, heart healthy.  Carbohydrate-modified. Wound care: None.   History of Present Illness:    Tyler [name] is a 83 y.o. male with a past medical history significant for NIDDM, HTN, and stroke without residual deficits who presents with transient confusion, slurred speech.  Caveat that patient may be unreliable historian, family not available to corroborate. The patient was in his usual state of health (lives with wife, independent with all ADLs, walks with cane, went to church this morning, out to eat afterwards as usual) until this evening, he drifted off to sleep while watching a golf tournament on TV, and when his son woke him from his nap in order to chat, the patient was more disoriented than usual.  Per patient, he knew where he was, but just couldn't remember where he had eaten for lunch that day which is not like him to be forgetful and was a little dizzy briefly.  Patient thinks he may also have had some slurred speech.  All of this lasted only 10 minutes, and family brought patient to ER.  There was no paresthesias, focal weakness, passing out, facial asymmetry, visual disturbances, ataxia, recrudescence of old right sided deficits.   Hospital Course by Problem:   TIA vs. seizure  MRI  No acute stroke  MRA  No LVO.  Slight progression in atherosclerosis of right PCA since 2013  Carotid Doppler  unremarkable  2D Echo  EF 55%. No source of embolus   EEG negative  LDL 66  HgbA1c 6.8  clopidogrel 75 mg daily prior to admission, now on clopidogrel 75 mg daily. Continue plavix on discharge per neuro   Hypertension  Stable   BP goal normotensive  Hyperlipidemia  Home meds:  LIPITOR 20, resumed in hospital  LDL 66, goal < 70  Continue statin at discharge  Diabetes  HgbA1c 6.8 , goal < 7.0  SSI  CBG monitoring  CKD -at baseline  Microscopic hematuria: -Repeat UA in 6 weeks with PCP  Medical Consultants:    Neuro.   Discharge Exam:   Vitals:   06/30/16 1128 06/30/16 1326  BP: (!) 146/64 138/60  Pulse: 66 75  Resp: 16 16  Temp: 97.5 F (36.4 C) 98.2 F (36.8 C)   Vitals:   06/30/16 0607 06/30/16 0933 06/30/16 1128 06/30/16 1326  BP: 126/79 (!) 146/64 (!) 146/64 138/60  Pulse: 62 77 66 75  Resp: 16 16 16 16   Temp: 97.8 F (36.6 C) 97.8 F (36.6 C) 97.5 F (36.4 C) 98.2 F (36.8 C)  TempSrc: Oral Oral Oral Oral  SpO2: 100% 100% 100% 99%  Weight:      Height:        Gen:  NAD    The results of significant diagnostics from this hospitalization (including imaging, microbiology, ancillary and laboratory) are listed below for reference.     Procedures and  Diagnostic Studies:   No results found.   Labs:   Basic Metabolic Panel:  Recent Labs Lab 06/28/16 2018  NA 138  K 4.1  CL 105  CO2 25  GLUCOSE 144*  BUN 23*  CREATININE 1.46*  CALCIUM 9.3   GFR Estimated Creatinine Clearance: 35.8 mL/min (by C-G formula based on SCr of 1.46 mg/dL (H)). Liver Function Tests: No results for input(s): AST, ALT, ALKPHOS, BILITOT, PROT, ALBUMIN in the last 168 hours. No results for input(s): LIPASE, AMYLASE in the last 168 hours. No results for input(s): AMMONIA in the last 168 hours. Coagulation profile No results for input(s): INR, PROTIME in the last  168 hours.  CBC:  Recent Labs Lab 06/28/16 2018  WBC 6.9  NEUTROABS 4.7  HGB 12.0*  HCT 35.7*  MCV 93.5  PLT 156   Cardiac Enzymes: No results for input(s): CKTOTAL, CKMB, CKMBINDEX, TROPONINI in the last 168 hours. BNP: Invalid input(s): POCBNP CBG:  Recent Labs Lab 06/29/16 1221 06/29/16 1602 06/29/16 2134 06/30/16 0638 06/30/16 1122  GLUCAP 121* 180* 96 140* 163*   D-Dimer No results for input(s): DDIMER in the last 72 hours. Hgb A1c  Recent Labs  06/29/16 0701  HGBA1C 6.8*   Lipid Profile  Recent Labs  06/29/16 0701  CHOL 107  HDL 28*  LDLCALC 66  TRIG 66  CHOLHDL 3.8   Thyroid function studies No results for input(s): TSH, T4TOTAL, T3FREE, THYROIDAB in the last 72 hours.  Invalid input(s): FREET3 Anemia work up No results for input(s): VITAMINB12, FOLATE, FERRITIN, TIBC, IRON, RETICCTPCT in the last 72 hours. Microbiology No results found for this or any previous visit (from the past 240 hour(s)).   Discharge Instructions:   Discharge Instructions    Ambulatory referral to Neurology    Complete by:  As directed    An appointment is requested in approximately: 4-6 weeks TIA   Diet - low sodium heart healthy    Complete by:  As directed    Diet Carb Modified    Complete by:  As directed    Increase activity slowly    Complete by:  As directed      Allergies as of 06/30/2016      Reactions   Sulfa Drugs Cross Reactors Anaphylaxis, Rash   Lungs fill with fluid      Medication List    TAKE these medications   amLODipine 2.5 MG tablet Commonly known as:  NORVASC Take 2.5 mg by mouth daily.   atorvastatin 20 MG tablet Commonly known as:  LIPITOR Take 20 mg by mouth daily at 6 PM.   cholecalciferol 1000 units tablet Commonly known as:  VITAMIN D Take 1,000 Units by mouth 2 (two) times daily.   clopidogrel 75 MG tablet Commonly known as:  PLAVIX Take 1 tablet (75 mg total) by mouth daily with breakfast.   cyanocobalamin 1000  MCG tablet Take 1,000 mcg by mouth 2 (two) times daily.   docusate sodium 100 MG capsule Commonly known as:  COLACE Take 1 capsule (100 mg total) by mouth 2 (two) times daily. What changed:  when to take this   fish oil-omega-3 fatty acids 1000 MG capsule Take 1 g by mouth 2 (two) times daily.   Flaxseed Oil 1000 MG Caps Take 1,000 mg by mouth 2 (two) times daily.   glimepiride 2 MG tablet Commonly known as:  AMARYL Take 2 mg by mouth daily.   ibuprofen 200 MG tablet Commonly known as:  ADVIL,MOTRIN Take  200 mg by mouth every 6 (six) hours as needed for headache (pain).   losartan 50 MG tablet Commonly known as:  COZAAR Take 50 mg by mouth at bedtime.   ONE TOUCH ULTRA TEST test strip Generic drug:  glucose blood   onetouch ultrasoft lancets   pantoprazole 40 MG tablet Commonly known as:  PROTONIX Take 1 tablet (40 mg total) by mouth daily.   [place] Wort 1000 MG Caps Take 1,000 mg by mouth at bedtime.         Time coordinating discharge: 35 min  Signed:  Chryl Holten U Coreyon Nicotra   [facility] 06/30/2016, 1:43 PM

## 2016-06-30 NOTE — Care Management Note (Signed)
Case Management Note  Patient Details  Name: BRANSYN ZUNICH MRN: [id number] Date of Birth: [birth year]  Subjective/Objective:                    Action/Plan: Plan is for patient to d/c home today with his wife. No f/u per PT/OT and no DME needs. Wife to provide transportation home.  Expected Discharge Date:                  Expected Discharge Plan:  Home/Self Care  In-House Referral:     Discharge planning Services     Post Acute Care Choice:    Choice offered to:     DME Arranged:    DME Agency:     HH Arranged:    [place] Agency:     Status of Service:  Completed, signed off  If discussed at [employer] of Stay Meetings, dates discussed:    Additional Comments:  Pollie Friar, RN 06/30/2016, 12:04 PM

## 2016-06-30 NOTE — Progress Notes (Signed)
Occupational Therapy Treatment Patient Details Name: Tyler [name] MRN: [id number] DOB: 08-06-1928 Today's Date: 06/30/2016    History of present illness Pt is an 83 y.o. male who presented to the ED after an episode of altered mental status and slurred speech.  Imaging negative for acute infarct. PMH: diabetes mellitus, hypertension, previous stroke and kidney stones, and R femur IM nail.   OT comments  Pt progressing well toward OT goals. He was able to follow multi-step ADL command with minimal verbal cueing this session. He required min guard assist for safety with toilet transfer and was able to self-correct LOB x1. He continues to demonstrate decreased short-term memory and some difficulty with higher level problem solving tasks but this is likely baseline per pt report. Will continue to follow acutely. D/C plan remains appropriate.   Follow Up Recommendations  No OT follow up;Supervision/Assistance - 24 hour    Equipment Recommendations  None recommended by OT (Has needs met)    Recommendations for Other Services      Precautions / Restrictions Precautions Precautions: Fall Restrictions Weight Bearing Restrictions: No       Mobility Bed Mobility               General bed mobility comments: up in chair  Transfers Overall transfer level: Needs assistance Equipment used: None Transfers: Sit to/from Stand Sit to Stand: Supervision         General transfer comment: Supervision for safety.    Balance Overall balance assessment: Needs assistance Sitting-balance support: No upper extremity supported;Feet supported Sitting balance-Leahy Scale: Good     Standing balance support: No upper extremity supported Standing balance-Leahy Scale: Good Standing balance comment: ambulates without device                   ADL Overall ADL's : Needs assistance/impaired     Grooming: Wash/dry hands;Supervision/safety;Standing                   Toilet  Transfer: Min guard;Ambulation;BSC Toilet Transfer Details (indicate cue type and reason): LOB x1 while ambulating to sink but able to self-correct.         Functional mobility during ADLs: Min guard General ADL Comments: Required verbal cues to remember tasks during 3-step command.      Vision                 Additional Comments: No changes from evaluation.   Perception     Praxis      Cognition   Behavior During Therapy: WFL for tasks assessed/performed Overall Cognitive Status: History of cognitive impairments - at baseline (Memory loss at baseline) Area of Impairment: Memory     Memory: Decreased short-term memory        Problem Solving: Slow processing General Comments: Pt with difficulty completing visual construction task to draw a clock. However he was aware of deficits and with verbal and visual cues, he was able to correct his errors. Difficulty with short-term memory noted when following multi-step commands. Feel this is likely baseline. Wife not present to attest.      Exercises     Shoulder Instructions       General Comments      Pertinent Vitals/ Pain       Pain Assessment: No/denies pain  Home Living  Prior Functioning/Environment              Frequency  Min 2X/week        Progress Toward Goals  OT Goals(current goals can now be found in the care plan section)  Progress towards OT goals: Progressing toward goals  Acute Rehab OT Goals Patient Stated Goal: to go home today OT Goal Formulation: With patient Time For Goal Achievement: 07/13/16 Potential to Achieve Goals: Good ADL Goals Pt Will Perform Grooming: with modified independence;standing Additional ADL Goal #1: Pt will complete multi-step standing ADL tasks with no verbal cues for problem solving in a moderately distracting environment.  Plan Discharge plan remains appropriate    Co-evaluation                  End of Session    OT Visit Diagnosis: Unsteadiness on feet (R26.81)   Activity Tolerance Patient tolerated treatment well   Patient Left in chair;with call bell/phone within reach;with chair alarm set   Nurse Communication Mobility status    Functional Assessment Tool Used: AM-PAC 6 Clicks Daily Activity Functional Limitation: Self care Self Care Current Status (D6387): At least 20 percent but less than 40 percent impaired, limited or restricted Self Care Goal Status (F6433): At least 1 percent but less than 20 percent impaired, limited or restricted   Time: 1216-1240 OT Time Calculation (min): 24 min  Charges: OT G-codes **NOT FOR INPATIENT CLASS** Functional Assessment Tool Used: AM-PAC 6 Clicks Daily Activity Functional Limitation: Self care Self Care Current Status (I9518): At least 20 percent but less than 40 percent impaired, limited or restricted Self Care Goal Status (A4166): At least 1 percent but less than 20 percent impaired, limited or restricted OT General Charges $OT Visit: 1 Procedure OT Treatments $Self Care/Home Management : 8-22 mins $Therapeutic Activity: 8-22 mins  Norman Herrlich, MS OTR/L  Pager: [place] 06/30/2016, 1:43 PM

## 2016-06-30 NOTE — Progress Notes (Signed)
STROKE TEAM PROGRESS NOTE   SUBJECTIVE (INTERVAL HISTORY) No family at bedside. Pt is sitting in chair. Had EEG today which is normal. He is eager to go home. Will follow up with Dr. Leonie Man.    OBJECTIVE Temp:  [97.5 F (36.4 C)-98.6 F (37 C)] 98.2 F (36.8 C) (02/27 1326) Pulse Rate:  [56-77] 75 (02/27 1326) Cardiac Rhythm: Heart block (02/27 0703) Resp:  [16] 16 (02/27 1326) BP: (126-146)/(57-79) 138/60 (02/27 1326) SpO2:  [97 %-100 %] 99 % (02/27 1326)  CBC:   Recent Labs Lab 06/28/16 2018  WBC 6.9  NEUTROABS 4.7  HGB 12.0*  HCT 35.7*  MCV 93.5  PLT [id number]    Basic Metabolic Panel:   Recent Labs Lab 06/28/16 2018  NA 138  K 4.1  CL 105  CO2 25  GLUCOSE 144*  BUN 23*  CREATININE 1.46*  CALCIUM 9.3    Lipid Panel:     Component Value Date/Time   CHOL 107 06/29/2016 0701   TRIG 66 06/29/2016 0701   HDL 28 (L) 06/29/2016 0701   CHOLHDL 3.8 06/29/2016 0701   VLDL 13 06/29/2016 0701   LDLCALC 66 06/29/2016 0701   HgbA1c:  Lab Results  Component Value Date   HGBA1C 6.8 (H) 06/29/2016   Urine Drug Screen: No results found for: LABOPIA, COCAINSCRNUR, LABBENZ, AMPHETMU, THCU, LABBARB    IMAGING I have personally reviewed the radiological images below and agree with the radiology interpretations.  Ct Head Wo Contrast 06/28/2016 1. No acute intracranial abnormality. 2. Chronic small vessel ischemic change.   Mr Brain Wo Contrast 06/29/2016 1. No acute intracranial infarct or other process identified. 2. Mild chronic microvascular ischemic disease with remote lacunar infarcts involving the bilateral basal ganglia/corona radiata. 3. Mild ventricular prominence, likely related to underlying global atrophy, although a component of underlying NPH would be difficult to exclude.   Mr Jodene Nam Head Wo Contrast 06/29/2016 No large or medium vessel intracranial occlusion. No anterior circulation pathology is seen. 25% stenosis of the distal basilar artery.  Atherosclerotic change of the superior cerebellar and PCA branches, most notable in the right PCA where there is stenosis proximally 30-50%. Findings are slightly progressive since 2013.   2D Echocardiogram  - Left ventricle: Inferobasal hypokinesis. The cavity size was normal. Systolic function was normal. The estimated ejection fraction was 55%. Wall motion was normal; there were no regional wall motion abnormalities. Left ventricular diastolic function parameters were normal. - Atrial septum: No defect or patent foramen ovale was identified.  CUS - Bilateral: 1-39% ICA stenosis. Vertebral artery flow is antegrade.  EEG This awake and asleep EEG is normal.    PHYSICAL EXAM  Temp:  [97.5 F (36.4 C)-98.6 F (37 C)] 98.2 F (36.8 C) (02/27 1326) Pulse Rate:  [56-77] 75 (02/27 1326) Resp:  [16] 16 (02/27 1326) BP: (126-146)/(57-79) 138/60 (02/27 1326) SpO2:  [97 %-100 %] 99 % (02/27 1326)  General - Well nourished, well developed, in no apparent distress.  Ophthalmologic - Fundi not visualized due to by movement.  Cardiovascular - Regular rate and rhythm.  Mental Status -  Level of arousal and orientation to time, place, and person were intact. Language including expression, naming, repetition, comprehension was assessed and found intact. Fund of Knowledge was assessed and was intact.  Cranial Nerves II - XII - II - Visual field intact OU. III, IV, VI - Extraocular movements intact. V - Facial sensation intact bilaterally. VII - right facial droop. VIII - hard of hearing &  vestibular intact bilaterally. X - Palate elevates symmetrically. XI - Chin turning & shoulder shrug intact bilaterally. XII - Tongue protrusion intact.  Motor Strength - The patient's strength was normal in all extremities and pronator drift was absent.  Bulk was normal and fasciculations were absent.   Motor Tone - Muscle tone was assessed at the neck and appendages and was normal.  Reflexes - The  patient's reflexes were 1+ in all extremities and he had no pathological reflexes.  Sensory - Light touch, temperature/pinprick were assessed and were symmetrical.    Coordination - The patient had normal movements in the hands and [place] yes that's right with no ataxia or dysmetria.  Tremor was absent.  Gait and Station - The patient's transfers, posture, gait, station, and turns were observed as normal.   ASSESSMENT/PLAN Mr. Tyler [name] is a 83 y.o. male with history of DM, HTN, previous stroke and kidney stonespresenting with transient speech difficulty and confusion. He did not receive IV t-PA due to deficits resolved.   TIA vs. seizure  MRI  No acute stroke  MRA  No LVO. Slight progression in atherosclerosis of right PCA since 2013  Carotid Doppler  unremarkable  2D Echo  EF 55%. No source of embolus   EEG normal  LDL 66  HgbA1c 6.8  Lovenox 40 mg sq daily for VTE prophylaxis Diet - low sodium heart healthy Diet Carb Modified  clopidogrel 75 mg daily prior to admission, now on clopidogrel 75 mg daily. Continue plavix on discharge.   Patient counseled to be compliant with his antithrombotic medications  Ongoing aggressive stroke risk factor management  Therapy recommendations:  none   Disposition:  home - follows with Dr[facility] Leonie Man at [facility]  Hypertension  Stable  BP goal normotensive  Hyperlipidemia  Home meds:  LIPITOR 20, resumed in hospital  LDL 66, goal < 70  Continue statin at discharge  Diabetes  HgbA1c 6.8, goal < 7.0  SSI  CBG monitoring  Other Stroke Risk Factors  Advanced age  Hx stroke/TIA  01/2012 L posterior, L LN/CR infarct d/t small vessel disease - residue right facial droop   Other Active Problems  CKD stage III  Microscopic hematuria   Follow up [facility] Dr[facility] Leonie Man at [facility] in the past.  Hospital day # 0  Neurology will sign off. Please call with questions. Pt will follow up with Dr. Silverio Lay NP at [facility] as  scheduled. Thanks for the consult.  Rosalin Hawking, MD PhD Stroke Neurology 06/30/2016 9:43 PM   To contact Stroke Continuity provider, please refer to [email or web address] After hours, contact General Neurology

## 2016-07-23 DIAGNOSIS — E789 Disorder of lipoprotein metabolism, unspecified: Secondary | ICD-10-CM | POA: Diagnosis not present

## 2016-07-23 DIAGNOSIS — I10 Essential (primary) hypertension: Secondary | ICD-10-CM | POA: Diagnosis not present

## 2016-07-23 DIAGNOSIS — R05 Cough: Secondary | ICD-10-CM | POA: Diagnosis not present

## 2016-07-23 DIAGNOSIS — E118 Type 2 diabetes mellitus with unspecified complications: Secondary | ICD-10-CM | POA: Diagnosis not present

## 2016-08-04 ENCOUNTER — Ambulatory Visit (INDEPENDENT_AMBULATORY_CARE_PROVIDER_SITE_OTHER): Payer: PPO | Admitting: Podiatry

## 2016-08-04 ENCOUNTER — Encounter: Payer: Self-pay | Admitting: Podiatry

## 2016-08-04 VITALS — BP 101/63 | HR 70

## 2016-08-04 DIAGNOSIS — M79676 Pain in unspecified toe(s): Secondary | ICD-10-CM | POA: Diagnosis not present

## 2016-08-04 DIAGNOSIS — B351 Tinea unguium: Secondary | ICD-10-CM | POA: Diagnosis not present

## 2016-08-04 NOTE — Progress Notes (Signed)
   Subjective:    Patient ID: Tyler [name], male    DOB: 17-Jun-1928, 83 y.o.   MRN: [id number]  HPI this patient presents to the office with chief complaint of a painful and thickened right great toenail a she states that this toenail is painful walking and wearing his shoes.  Patient states that he is diabetic on medicine. He presents the office today for an evaluation and treatment of this painful nail    Review of Systems  All other systems reviewed and are negative.      Objective:   Physical Exam GENERAL APPEARANCE: Alert, conversant. Appropriately groomed. No acute distress.  VASCULAR: Pedal pulses are  palpable at  [facility] and PT bilateral.  Capillary refill time is immediate to all digits,  Normal temperature gradient.   NEUROLOGIC: sensation is normal to 5.07 monofilament at 5/5 sites bilateral.  Light touch is intact bilateral, Muscle strength normal.  MUSCULOSKELETAL: acceptable muscle strength, tone and stability bilateral.  Intrinsic muscluature intact bilateral.  Rectus appearance of foot and digits noted bilateral.   DERMATOLOGIC: skin color, texture, and turgor are within normal limits.  No preulcerative lesions or ulcers  are seen, no interdigital maceration noted.  No open lesions present.  . No drainage noted.  NAILS  Thick disfigured discoilored great toenail right foot.         Assessment & Plan:  Onychomycosis right hallux.  Diabetes.  IE  Debride right hallux toenail.  Told the patient that we can consider surgery if there is minimal improvement with my debridement of his toenail.    Gardiner Barefoot DPM

## 2016-08-26 DIAGNOSIS — I639 Cerebral infarction, unspecified: Secondary | ICD-10-CM | POA: Diagnosis not present

## 2016-08-26 DIAGNOSIS — E118 Type 2 diabetes mellitus with unspecified complications: Secondary | ICD-10-CM | POA: Diagnosis not present

## 2016-08-26 DIAGNOSIS — C61 Malignant neoplasm of prostate: Secondary | ICD-10-CM | POA: Diagnosis not present

## 2016-09-23 ENCOUNTER — Encounter (INDEPENDENT_AMBULATORY_CARE_PROVIDER_SITE_OTHER): Payer: Self-pay

## 2016-09-23 ENCOUNTER — Ambulatory Visit (INDEPENDENT_AMBULATORY_CARE_PROVIDER_SITE_OTHER): Payer: PPO | Admitting: Nurse Practitioner

## 2016-09-23 ENCOUNTER — Encounter: Payer: Self-pay | Admitting: Nurse Practitioner

## 2016-09-23 VITALS — BP 140/60 | HR 73 | Ht 70.0 in | Wt 178.0 lb

## 2016-09-23 DIAGNOSIS — E785 Hyperlipidemia, unspecified: Secondary | ICD-10-CM

## 2016-09-23 DIAGNOSIS — I10 Essential (primary) hypertension: Secondary | ICD-10-CM | POA: Diagnosis not present

## 2016-09-23 DIAGNOSIS — G459 Transient cerebral ischemic attack, unspecified: Secondary | ICD-10-CM | POA: Diagnosis not present

## 2016-09-23 DIAGNOSIS — E119 Type 2 diabetes mellitus without complications: Secondary | ICD-10-CM | POA: Diagnosis not present

## 2016-09-23 NOTE — Progress Notes (Addendum)
[facility]  PATIENT: Tyler [name] DOB: 08/13/1928   REASON FOR VISIT: Hospital follow-up for TIA HISTORY FROM: Patient    HISTORY OF PRESENT ILLNESS:Tyler W Dawsonis an 83 y.o.malehistory diabetes mellitus, hypertension, previous stroke and kidney stones, presenting following an episode of transient speech difficulty and confusion. He reportedly had slurring of speech with gibberish content which apparently lasted about 10 minutes. He has a history of previous stroke which affected arm and leg requiring rehabilitation. He's been taking Plavix 75 mg per day. CT scan of her head showed no acute intracranial abnormality. MRI study also showed no acute findings including no acute stroke. Chronic microvascular ischemic changes and lacunar infarctions were noted. NIH stroke score at the time of this evaluation was 0. There is no associated facial droop nor weakness of right arm and leg with his presenting symptom. Patient was not administered IV t-PA secondary to Deficits resolved. He was admitted for further evaluation and treatment. MRI of the brain no acute stroke. MRA no large vessel occlusion, slight progression in atherosclerosis of right CVA since 2013. Carotid Doppler unremarkable 2-D echo EF 55% no source of embolus. EEG normal LDL 66. Hemoglobin A1c 6.8. Mr. Tyler [name] returns to the stroke clinic today for follow-up. He remains on Plavix for secondary stroke prevention without further stroke or TIA symptoms. No bruising and no bleeding. He remains on Lipitor for hyperlipidemia without complaints of muscle cramps. CBGs every day range between 80-120. He continues his Amaryl. He is exercising on a stationary bike every day. He returns for reevaluation   REVIEW OF SYSTEMS: Full 14 system review of systems performed and notable only for those listed, all others are neg:  Constitutional: Fatigue  Cardiovascular: neg Ear/Nose/Throat: neg  Skin: neg Eyes: neg Respiratory:  neg Gastroitestinal: neg  Hematology/Lymphatic: neg  Endocrine: neg Musculoskeletal: Back pain Allergy/Immunology: neg Neurological: neg Psychiatric: neg Sleep : neg   ALLERGIES: Allergies  Allergen Reactions  . Sulfa Drugs Cross Reactors Anaphylaxis and Rash    Lungs fill with fluid    HOME MEDICATIONS: Outpatient Medications Prior to Visit  Medication Sig Dispense Refill  . amLODipine (NORVASC) 2.5 MG tablet Take 2.5 mg by mouth daily.     Tyler [name] atorvastatin (LIPITOR) 20 MG tablet Take 20 mg by mouth daily at 6 PM.     . cholecalciferol (VITAMIN D) 1000 UNITS tablet Take 1,000 Units by mouth 2 (two) times daily.     . clopidogrel (PLAVIX) 75 MG tablet Take 1 tablet (75 mg total) by mouth daily with breakfast. 30 tablet 6  . cyanocobalamin 1000 MCG tablet Take 1,000 mcg by mouth 2 (two) times daily.     Tyler [name] docusate sodium (COLACE) 100 MG capsule Take 1 capsule (100 mg total) by mouth 2 (two) times daily. (Patient taking differently: Take 100 mg by mouth every 3 (three) days. ) 10 capsule 0  . fish oil-omega-3 fatty acids 1000 MG capsule Take 1 g by mouth 2 (two) times daily.     . Flaxseed, Linseed, (FLAXSEED OIL) 1000 MG CAPS Take 1,000 mg by mouth 2 (two) times daily.     Tyler [name] glimepiride (AMARYL) 2 MG tablet Take 2 mg by mouth daily.    Tyler [name] ibuprofen (ADVIL,MOTRIN) 200 MG tablet Take 200 mg by mouth every 6 (six) hours as needed for headache (pain).    . Lancets (ONETOUCH ULTRASOFT) lancets     . losartan (COZAAR) 50 MG tablet Take 50 mg by mouth at bedtime.     Tyler [name]  ONE TOUCH ULTRA TEST test strip     . pantoprazole (PROTONIX) 40 MG tablet Take 1 tablet (40 mg total) by mouth daily. 30 tablet 1  . St Johns Wort 1000 MG CAPS Take 1,000 mg by mouth at bedtime.     No facility-administered medications prior to visit.     PAST MEDICAL HISTORY: Past Medical History:  Diagnosis Date  . Cancer [facility])    Prostate  . Diabetes mellitus without complication ([place])   . Hypertension   .  Kidney stones   . Stroke [facility])     PAST SURGICAL HISTORY: Past Surgical History:  Procedure Laterality Date  . CARDIAC CATHETERIZATION    . FEMUR IM NAIL Right 11/17/2015   Procedure: INTRAMEDULLARY (IM) NAIL FEMORAL;  Surgeon: Marybelle Killings, MD;  Location: [place];  Service: Orthopedics;  Laterality: Right;  . PROSTATE SURGERY      FAMILY HISTORY: Family History  Problem Relation Age of Onset  . Emphysema Father   . Heart Problems Mother     SOCIAL HISTORY: Social History   Social History  . Marital status: Married    Spouse name: Tyler Mcalpine  . Number of children: 2  . Years of education: 12   Occupational History  . retired Retired    Orthoptist   Social History Main Topics  . Smoking status: Former Smoker    Quit date: 05/04/1968  . Smokeless tobacco: Never Used  . Alcohol use No  . Drug use: No  . Sexual activity: Not on file   Other Topics Concern  . Not on file   Social History Narrative   Patient lives at home with his wife Tyler [name]) retired. Patient has a high school education. Two Children . No caffeine. Right handed.     PHYSICAL EXAM  B/P 140/60. P 73 Body mass index is 25.54 kg/m.  Generalized: Well developed, in no acute distress  Head: normocephalic and atraumatic,. Oropharynx benign  Neck: Supple, no carotid bruits  Cardiac: Regular rate rhythm, no murmur  Musculoskeletal: No deformity   Neurological examination   Mentation: Alert oriented to time, place, history taking. Attention span and concentration appropriate. Recent and remote memory intact.  Follows all commands speech and language fluent.   Cranial nerve II-XII: Fundoscopic exam reveals sharp disc margins.Pupils were equal round reactive to light extraocular movements were full, visual field were full on confrontational test. Facial strength and sensation normal .Hard of hearing. Uvula tongue midline. head turning and shoulder shrug were normal and symmetric.Tongue protrusion into cheek  strength was normal. Motor: normal bulk and tone, full strength in the BUE, BLE, fine finger movements normal, no pronator drift. No focal weakness Sensory: normal and symmetric to light touch, pinprick, and  Vibration, proprioception  Coordination: finger-nose-finger, heel-to-shin bilaterally, no dysmetria, no tremor Reflexes: Symmetric upper and lower plantar responses were flexor bilaterally. Gait and Station: Rising up from seated position without assistance, normal stance,  moderate stride, good arm swing, smooth turning, able to perform tiptoe, and heel walking without difficulty. Tandem gait is mildly unsteady. No assistive device  DIAGNOSTIC DATA (LABS, IMAGING, TESTING) - I reviewed patient records, labs, notes, testing and imaging myself where available.  Lab Results  Component Value Date   WBC 6.9 06/28/2016   HGB 12.0 (L) 06/28/2016   HCT 35.7 (L) 06/28/2016   MCV 93.5 06/28/2016   PLT 156 06/28/2016      Component Value Date/Time   NA 138 06/28/2016 2018   K 4.1 06/28/2016 2018  CL 105 06/28/2016 2018   CO2 25 06/28/2016 2018   GLUCOSE 144 (H) 06/28/2016 2018   BUN 23 (H) 06/28/2016 2018   CREATININE 1.46 (H) 06/28/2016 2018   CALCIUM 9.3 06/28/2016 2018   PROT 6.0 (L) 11/26/2015 0722   ALBUMIN 3.2 (L) 11/26/2015 0722   AST 25 11/26/2015 0722   ALT 67 (H) 11/26/2015 0722   ALKPHOS 127 (H) 11/26/2015 0722   BILITOT 1.1 11/26/2015 0722   GFRNONAA 42 (L) 06/28/2016 2018   GFRAA 49 (L) 06/28/2016 2018   Lab Results  Component Value Date   CHOL 107 06/29/2016   HDL 28 (L) 06/29/2016   LDLCALC 66 06/29/2016   TRIG 66 06/29/2016   CHOLHDL 3.8 06/29/2016   Lab Results  Component Value Date   HGBA1C 6.8 (H) 06/29/2016   Lab Results  Component Value Date   VITAMINB12 580 11/20/2015     ASSESSMENT AND PLAN  83 y.o. year old male  has a past medical history of Cancer ([place]); Diabetes mellitus without complication ([place]); Hypertension; Kidney stones; and Stroke  September 2013 [facility]). here for hospital follow-up TIA February 2018.MRI of the brain no acute stroke. MRA no large vessel occlusion, slight progression in atherosclerosis of right CVA since 2013. Carotid Doppler unremarkable 2-D echo EF 55% no source of embolus. EEG normal LDL 66. Hemoglobin A1c 6.8.The patient is a current patient of Dr. Erlinda Hong  who is out of the office today . This note is sent to the work in doctor.     PLAN: Stressed the importance of management of risk factors to prevent further TIA/stroke Continue Plavix for secondary stroke prevention Maintain strict control of hypertension with blood pressure goal below 130/90, today's reading 140/60 continue antihypertensive medications Control of diabetes with hemoglobin A1c below 6.5 followed by primary care most recent hemoglobin A1c 6.8 continue diabetic medications Cholesterol with LDL cholesterol less than 70, followed by primary care,  most recent 66 continue statin drugs Lipitor  Exercise by walking, slowly increase , eat healthy diet with whole grains,  fresh fruits and vegetables Follow-up in 6 months Discussed risk for recurrent stroke/ TIA and answered additional questions This was a visit requiring 25 minutes and medical decision making of high complexity with extensive review of history, hospital chart, counseling and answering questions Dennie Bible, [facility], [facility], APRN  [facility] [address] [place], [place] [address] [phone]  I reviewed the above note and documentation by the Nurse Practitioner and agree with the history, physical exam, assessment and plan as outlined above. I was immediately available for face-to-face consultation. Star Age, MD, PhD [facility])

## 2016-09-23 NOTE — Patient Instructions (Signed)
Stressed the importance of management of risk factors to prevent further TIA/stroke Continue Plavix for secondary stroke prevention Maintain strict control of hypertension with blood pressure goal below 130/90, today's reading 140/60 continue antihypertensive medications Control of diabetes with hemoglobin A1c below 6.5 followed by primary care most recent hemoglobin A1c 6.8 continue diabetic medications Cholesterol with LDL cholesterol less than 70, followed by primary care,  most recent 66 continue statin drugs Lipitor  Exercise by walking, slowly increase , eat healthy diet with whole grains,  fresh fruits and vegetables Follow-up in 6 months

## 2016-11-03 ENCOUNTER — Ambulatory Visit: Payer: PPO | Admitting: Podiatry

## 2016-12-08 DIAGNOSIS — N200 Calculus of kidney: Secondary | ICD-10-CM | POA: Diagnosis not present

## 2016-12-08 DIAGNOSIS — N281 Cyst of kidney, acquired: Secondary | ICD-10-CM | POA: Diagnosis not present

## 2016-12-08 DIAGNOSIS — Z8546 Personal history of malignant neoplasm of prostate: Secondary | ICD-10-CM | POA: Diagnosis not present

## 2016-12-08 DIAGNOSIS — M5489 Other dorsalgia: Secondary | ICD-10-CM | POA: Diagnosis not present

## 2016-12-08 DIAGNOSIS — M545 Low back pain: Secondary | ICD-10-CM | POA: Diagnosis not present

## 2017-01-14 DIAGNOSIS — Z0000 Encounter for general adult medical examination without abnormal findings: Secondary | ICD-10-CM | POA: Diagnosis not present

## 2017-01-14 DIAGNOSIS — Z125 Encounter for screening for malignant neoplasm of prostate: Secondary | ICD-10-CM | POA: Diagnosis not present

## 2017-01-14 DIAGNOSIS — Z23 Encounter for immunization: Secondary | ICD-10-CM | POA: Diagnosis not present

## 2017-01-14 DIAGNOSIS — E119 Type 2 diabetes mellitus without complications: Secondary | ICD-10-CM | POA: Diagnosis not present

## 2017-01-14 DIAGNOSIS — C61 Malignant neoplasm of prostate: Secondary | ICD-10-CM | POA: Diagnosis not present

## 2017-01-14 DIAGNOSIS — E7800 Pure hypercholesterolemia, unspecified: Secondary | ICD-10-CM | POA: Diagnosis not present

## 2017-01-21 DIAGNOSIS — E7800 Pure hypercholesterolemia, unspecified: Secondary | ICD-10-CM | POA: Diagnosis not present

## 2017-01-21 DIAGNOSIS — Z8673 Personal history of transient ischemic attack (TIA), and cerebral infarction without residual deficits: Secondary | ICD-10-CM | POA: Diagnosis not present

## 2017-01-21 DIAGNOSIS — F329 Major depressive disorder, single episode, unspecified: Secondary | ICD-10-CM | POA: Diagnosis not present

## 2017-01-21 DIAGNOSIS — R972 Elevated prostate specific antigen [PSA]: Secondary | ICD-10-CM | POA: Diagnosis not present

## 2017-01-21 DIAGNOSIS — I10 Essential (primary) hypertension: Secondary | ICD-10-CM | POA: Diagnosis not present

## 2017-01-21 DIAGNOSIS — Z0000 Encounter for general adult medical examination without abnormal findings: Secondary | ICD-10-CM | POA: Diagnosis not present

## 2017-01-21 DIAGNOSIS — K219 Gastro-esophageal reflux disease without esophagitis: Secondary | ICD-10-CM | POA: Diagnosis not present

## 2017-01-21 DIAGNOSIS — Z8546 Personal history of malignant neoplasm of prostate: Secondary | ICD-10-CM | POA: Diagnosis not present

## 2017-01-21 DIAGNOSIS — E1165 Type 2 diabetes mellitus with hyperglycemia: Secondary | ICD-10-CM | POA: Diagnosis not present

## 2017-01-28 DIAGNOSIS — C61 Malignant neoplasm of prostate: Secondary | ICD-10-CM | POA: Diagnosis not present

## 2017-01-28 DIAGNOSIS — N3281 Overactive bladder: Secondary | ICD-10-CM | POA: Diagnosis not present

## 2017-03-29 NOTE — Progress Notes (Signed)
[facility]  PATIENT: Tyler [name] DOB: 06-18-1928   REASON FOR VISIT: follow-up for TIA February 2018 HISTORY FROM: Patient    HISTORY OF PRESENT ILLNESS:09/23/16 CMJohn W Dawsonis an 83 y.o.malehistory diabetes mellitus, hypertension, previous stroke and kidney stones, presenting following an episode of transient speech difficulty and confusion. He reportedly had slurring of speech with gibberish content which apparently lasted about 10 minutes. He has a history of previous stroke which affected arm and leg requiring rehabilitation. He's been taking Plavix 75 mg per day. CT scan of her head showed no acute intracranial abnormality. MRI study also showed no acute findings including no acute stroke. Chronic microvascular ischemic changes and lacunar infarctions were noted. NIH stroke score at the time of this evaluation was 0. There is no associated facial droop nor weakness of right arm and leg with his presenting symptom. Patient was not administered IV t-PA secondary to Deficits resolved. He was admitted for further evaluation and treatment. MRI of the brain no acute stroke. MRA no large vessel occlusion, slight progression in atherosclerosis of right CVA since 2013. Carotid Doppler unremarkable 2-D echo EF 55% no source of embolus. EEG normal LDL 66. Hemoglobin A1c 6.8. Tyler [name] returns to the stroke clinic today for follow-up. He remains on Plavix for secondary stroke prevention without further stroke or TIA symptoms. No bruising and no bleeding. He remains on Lipitor for hyperlipidemia without complaints of muscle cramps. CBGs every day range between 80-120. He continues his Amaryl. He is exercising on a stationary bike every day. He returns for reevaluation UPDATE 11/27/2018CM Tyler [name], 83 year old male returns for follow-up with history of TIA February 2018.  He remains on Plavix for secondary stroke prevention without further stroke or TIA symptoms.  He has some  bruising but no bleeding.  He remains on Lipitor without myalgias.  He ambulates with a quad cane, no recent falls.  He claims his blood sugars are in good control, he checks his CBGs every day and they typically run between 90-120.  He is not exercising as much since it has gotten colder weather and was encouraged to do so by going to the mall and walking indoors.  He has no new neurologic complaints.  He returns for reevaluation. REVIEW OF SYSTEMS: Full 14 system review of systems performed and notable only for those listed, all others are neg:  Constitutional: Fatigue  Cardiovascular: neg Ear/Nose/Throat: Hearing loss Skin: neg Eyes: neg Respiratory: neg Gastroitestinal: Urinary frequency Hematology/Lymphatic: Easy bruising Endocrine: neg Musculoskeletal: Back pain Allergy/Immunology: neg Neurological: neg Psychiatric: neg Sleep : neg   ALLERGIES: Allergies  Allergen Reactions  . Sulfa Drugs Cross Reactors Anaphylaxis and Rash    Lungs fill with fluid    HOME MEDICATIONS: Outpatient Medications Prior to Visit  Medication Sig Dispense Refill  . amLODipine (NORVASC) 2.5 MG tablet Take 2.5 mg by mouth daily.     Tyler [name] atorvastatin (LIPITOR) 20 MG tablet Take 20 mg by mouth daily at 6 PM.     . cholecalciferol (VITAMIN D) 1000 UNITS tablet Take 1,000 Units by mouth 2 (two) times daily.     . clopidogrel (PLAVIX) 75 MG tablet Take 1 tablet (75 mg total) by mouth daily with breakfast. 30 tablet 6  . cyanocobalamin 1000 MCG tablet Take 1,000 mcg by mouth 2 (two) times daily.     Tyler [name] docusate sodium (COLACE) 100 MG capsule Take 1 capsule (100 mg total) by mouth 2 (two) times daily. (Patient taking differently: Take 100 mg  by mouth every 3 (three) days. ) 10 capsule 0  . fish oil-omega-3 fatty acids 1000 MG capsule Take 1 g by mouth 2 (two) times daily.     . Flaxseed, Linseed, (FLAXSEED OIL) 1000 MG CAPS Take 1,000 mg by mouth 2 (two) times daily.     Tyler [name] glimepiride (AMARYL) 2 MG tablet Take  2 mg by mouth daily.    Tyler [name] ibuprofen (ADVIL,MOTRIN) 200 MG tablet Take 200 mg by mouth every 6 (six) hours as needed for headache (pain).    . Lancets (ONETOUCH ULTRASOFT) lancets     . losartan (COZAAR) 50 MG tablet Take 50 mg by mouth at bedtime.     . mirtazapine (REMERON) 15 MG tablet 15 mg.    . ONE TOUCH ULTRA TEST test strip     . pantoprazole (PROTONIX) 40 MG tablet Take 1 tablet (40 mg total) by mouth daily. 30 tablet 1  . St Johns Wort 1000 MG CAPS Take 1,000 mg by mouth at bedtime.     No facility-administered medications prior to visit.     PAST MEDICAL HISTORY: Past Medical History:  Diagnosis Date  . Cancer [facility])    Prostate  . Diabetes mellitus without complication ([place])   . Hypertension   . Kidney stones   . Stroke [facility])     PAST SURGICAL HISTORY: Past Surgical History:  Procedure Laterality Date  . CARDIAC CATHETERIZATION    . FEMUR IM NAIL Right 11/17/2015   Procedure: INTRAMEDULLARY (IM) NAIL FEMORAL;  Surgeon: Marybelle Killings, MD;  Location: [place];  Service: Orthopedics;  Laterality: Right;  . PROSTATE SURGERY      FAMILY HISTORY: Family History  Problem Relation Age of Onset  . Emphysema Father   . Heart Problems Mother     SOCIAL HISTORY: Social History   Socioeconomic History  . Marital status: Married    Spouse name: Tyler Mcalpine  . Number of children: 2  . Years of education: 31  . Highest education level: Not on file  Social Needs  . Financial resource strain: Not on file  . Food insecurity - worry: Not on file  . Food insecurity - inability: Not on file  . Transportation needs - medical: Not on file  . Transportation needs - non-medical: Not on file  Occupational History  . Occupation: retired    Fish farm manager: RETIRED    Comment: [employer]  Tobacco Use  . Smoking status: Former Smoker    Last attempt to quit: 05/04/1968    Years since quitting: 48.9  . Smokeless tobacco: Never Used  Substance and Sexual Activity  . Alcohol use: No  . Drug use:  No  . Sexual activity: Not on file  Other Topics Concern  . Not on file  Social History Narrative   Patient lives at home with his wife [name]) retired. Patient has a high school education. Two Children . No caffeine. Right handed.     PHYSICAL EXAM  B/P 150/90. P69 Body mass index is 25.22 kg/m.  Generalized: Well developed, in no acute distress , well-groomed Head: normocephalic and atraumatic,. Oropharynx benign  Neck: Supple, no carotid bruits  Cardiac: Regular rate rhythm, no murmur  Musculoskeletal: No deformity   Neurological examination   Mentation: Alert oriented to time, place, history taking. Attention span and concentration appropriate. Recent and remote memory intact.  Follows all commands speech and language fluent.   Cranial nerve II-XII: Pupils were equal round reactive to light extraocular movements were full, visual field  were full on confrontational test. Facial strength and sensation normal .Hard of hearing. Uvula tongue midline. head turning and shoulder shrug were normal and symmetric.Tongue protrusion into cheek strength was normal. Motor: normal bulk and tone, full strength in the BUE, BLE, fine finger movements normal, no pronator drift.  Sensory: normal and symmetric to light touch, in the upper and lower extremities Coordination: finger-nose-finger, heel-to-shin bilaterally, no dysmetria, no tremor Reflexes: Symmetric upper and lower plantar responses were flexor bilaterally. Gait and Station: Rising up from seated position without assistance, normal stance,  moderate stride, good arm swing, smooth turning, able to perform tiptoe, and heel walking without difficulty. Tandem gait is mildly unsteady.  Ambulates with four-point quad cane  DIAGNOSTIC DATA (LABS, IMAGING, TESTING) - I reviewed patient records, labs, notes, testing and imaging myself where available.  Lab Results  Component Value Date   WBC 6.9 06/28/2016   HGB 12.0 (L) 06/28/2016   HCT  35.7 (L) 06/28/2016   MCV 93.5 06/28/2016   PLT 156 06/28/2016      Component Value Date/Time   NA 138 06/28/2016 2018   K 4.1 06/28/2016 2018   CL 105 06/28/2016 2018   CO2 25 06/28/2016 2018   GLUCOSE 144 (H) 06/28/2016 2018   BUN 23 (H) 06/28/2016 2018   CREATININE 1.46 (H) 06/28/2016 2018   CALCIUM 9.3 06/28/2016 2018   PROT 6.0 (L) 11/26/2015 0722   ALBUMIN 3.2 (L) 11/26/2015 0722   AST 25 11/26/2015 0722   ALT 67 (H) 11/26/2015 0722   ALKPHOS 127 (H) 11/26/2015 0722   BILITOT 1.1 11/26/2015 0722   GFRNONAA 42 (L) 06/28/2016 2018   GFRAA 49 (L) 06/28/2016 2018   Lab Results  Component Value Date   CHOL 107 06/29/2016   HDL 28 (L) 06/29/2016   LDLCALC 66 06/29/2016   TRIG 66 06/29/2016   CHOLHDL 3.8 06/29/2016   Lab Results  Component Value Date   HGBA1C 6.8 (H) 06/29/2016   Lab Results  Component Value Date   VITAMINB12 580 11/20/2015     ASSESSMENT AND PLAN  83 y.o. year old male  has a past medical history of Cancer ([place]); Diabetes mellitus without complication ([place]); Hypertension; Kidney stones; and Stroke September 2013 [facility]). here for  follow-up TIA February 2018.MRI of the brain no acute stroke. MRA no large vessel occlusion, slight progression in atherosclerosis of right CVA since 2013. Carotid Doppler unremarkable 2-D echo EF 55% no source of embolus. EEG normal LDL 66. Hemoglobin A1c 6.8.  He is stable from a neurologic standpoint  PLAN: Stressed the importance of management of risk factors to prevent further TIA/stroke Continue Plavix for secondary stroke prevention Maintain strict control of hypertension with blood pressure goal below 130/90, today's reading 150/90 continue antihypertensive medications Control of diabetes with hemoglobin A1c below 6.5 followed by primary care  continue diabetic medications Cholesterol with LDL cholesterol less than 70, followed by primary care,  continue statin drugs Lipitor  Exercise by walking, use quad cane at all  times for safe ambulation  eat healthy diet with whole grains,  fresh fruits and vegetables Will discharge from stroke clinic I spent 25 minutes in total face to face time with the patient more than 50% of which was spent counseling and coordination of care, reviewing test results reviewing medications and discussing and reviewing the diagnosis of stroke/TIA and the importance of management of risk factors.  Encouraged to get back into his exercise routine Dennie Bible, [facility], [facility], APRN  [facility] [address] [place],  [address] [phone]

## 2017-03-30 ENCOUNTER — Encounter (INDEPENDENT_AMBULATORY_CARE_PROVIDER_SITE_OTHER): Payer: Self-pay

## 2017-03-30 ENCOUNTER — Ambulatory Visit: Payer: PPO | Admitting: Nurse Practitioner

## 2017-03-30 ENCOUNTER — Encounter: Payer: Self-pay | Admitting: Nurse Practitioner

## 2017-03-30 VITALS — BP 150/90 | HR 69 | Wt 175.8 lb

## 2017-03-30 DIAGNOSIS — G459 Transient cerebral ischemic attack, unspecified: Secondary | ICD-10-CM | POA: Diagnosis not present

## 2017-03-30 DIAGNOSIS — Z8673 Personal history of transient ischemic attack (TIA), and cerebral infarction without residual deficits: Secondary | ICD-10-CM

## 2017-03-30 DIAGNOSIS — E785 Hyperlipidemia, unspecified: Secondary | ICD-10-CM

## 2017-03-30 DIAGNOSIS — I10 Essential (primary) hypertension: Secondary | ICD-10-CM

## 2017-03-30 NOTE — Patient Instructions (Addendum)
Stressed the importance of management of risk factors to prevent further TIA/stroke Continue Plavix for secondary stroke prevention Maintain strict control of hypertension with blood pressure goal below 130/90, today's reading 150/90 continue antihypertensive medications Control of diabetes with hemoglobin A1c below 6.5 followed by primary care  continue diabetic medications Cholesterol with LDL cholesterol less than 70, followed by primary care,  continue statin drugs Lipitor  Exercise by walking, use quad cane at all times for safe ambulation  eat healthy diet with whole grains,  fresh fruits and vegetables Will discharge from stroke clinic Stroke Prevention Some medical conditions and behaviors are associated with an increased chance of having a stroke. You may prevent a stroke by making healthy choices and managing medical conditions. How can I reduce my risk of having a stroke?  Stay physically active. Get at least 30 minutes of activity on most or all days.  Do not smoke. It may also be helpful to avoid exposure to secondhand smoke.  Limit alcohol use. Moderate alcohol use is considered to be: ? No more than 2 drinks per day for men. ? No more than 1 drink per day for nonpregnant women.  Eat healthy foods. This involves: ? Eating 5 or more servings of fruits and vegetables a day. ? Making dietary changes that address high blood pressure (hypertension), high cholesterol, diabetes, or obesity.  Manage your cholesterol levels. ? Making food choices that are high in fiber and low in saturated fat, trans fat, and cholesterol may control cholesterol levels. ? Take any prescribed medicines to control cholesterol as directed by your health care provider.  Manage your diabetes. ? Controlling your carbohydrate and sugar intake is recommended to manage diabetes. ? Take any prescribed medicines to control diabetes as directed by your health care provider.  Control your  hypertension. ? Making food choices that are low in salt (sodium), saturated fat, trans fat, and cholesterol is recommended to manage hypertension. ? Ask your health care provider if you need treatment to lower your blood pressure. Take any prescribed medicines to control hypertension as directed by your health care provider. ? If you are 77-67 years of age, have your blood pressure checked every 3-5 years. If you are 83 years of age or older, have your blood pressure checked every year.  Maintain a healthy weight. ? Reducing calorie intake and making food choices that are low in sodium, saturated fat, trans fat, and cholesterol are recommended to manage weight.  Stop drug abuse.  Avoid taking birth control pills. ? Talk to your health care provider about the risks of taking birth control pills if you are over 3 years old, smoke, get migraines, or have ever had a blood clot.  Get evaluated for sleep disorders (sleep apnea). ? Talk to your health care provider about getting a sleep evaluation if you snore a lot or have excessive sleepiness.  Take medicines only as directed by your health care provider. ? For some people, aspirin or blood thinners (anticoagulants) are helpful in reducing the risk of forming abnormal blood clots that can lead to stroke. If you have the irregular heart rhythm of atrial fibrillation, you should be on a blood thinner unless there is a good reason you cannot take them. ? Understand all your medicine instructions.  Make sure that other conditions (such as anemia or atherosclerosis) are addressed. Get help right away if:  You have sudden weakness or numbness of the face, arm, or leg, especially on one side of the body.  Your face or eyelid droops to one side.  You have sudden confusion.  You have trouble speaking (aphasia) or understanding.  You have sudden trouble seeing in one or both eyes.  You have sudden trouble walking.  You have dizziness.  You  have a loss of balance or coordination.  You have a sudden, severe headache with no known cause.  You have new chest pain or an irregular heartbeat. Any of these symptoms may represent a serious problem that is an emergency. Do not wait to see if the symptoms will go away. Get medical help at once. Call your local emergency services (911 in [place] Do not drive yourself to the hospital. This information is not intended to replace advice given to you by your health care provider. Make sure you discuss any questions you have with your health care provider. Document Released: 05/28/2004 Document Revised: 09/26/2015 Document Reviewed: 10/21/2012 Elsevier Interactive Patient Education  2017 [employer].

## 2017-04-01 NOTE — Progress Notes (Signed)
I reviewed above note and agree with the assessment and plan.  Rosalin Hawking, MD PhD Stroke Neurology 04/01/2017 4:23 PM

## 2017-07-13 DIAGNOSIS — Z8546 Personal history of malignant neoplasm of prostate: Secondary | ICD-10-CM | POA: Diagnosis not present

## 2017-07-13 DIAGNOSIS — N200 Calculus of kidney: Secondary | ICD-10-CM | POA: Diagnosis not present

## 2017-07-13 DIAGNOSIS — N3281 Overactive bladder: Secondary | ICD-10-CM | POA: Diagnosis not present

## 2017-07-13 DIAGNOSIS — C61 Malignant neoplasm of prostate: Secondary | ICD-10-CM | POA: Diagnosis not present

## 2017-07-21 DIAGNOSIS — I10 Essential (primary) hypertension: Secondary | ICD-10-CM | POA: Diagnosis not present

## 2017-07-21 DIAGNOSIS — E7800 Pure hypercholesterolemia, unspecified: Secondary | ICD-10-CM | POA: Diagnosis not present

## 2017-07-21 DIAGNOSIS — E1165 Type 2 diabetes mellitus with hyperglycemia: Secondary | ICD-10-CM | POA: Diagnosis not present

## 2017-07-21 DIAGNOSIS — K219 Gastro-esophageal reflux disease without esophagitis: Secondary | ICD-10-CM | POA: Diagnosis not present

## 2017-07-21 DIAGNOSIS — F329 Major depressive disorder, single episode, unspecified: Secondary | ICD-10-CM | POA: Diagnosis not present

## 2017-08-05 DIAGNOSIS — F329 Major depressive disorder, single episode, unspecified: Secondary | ICD-10-CM | POA: Diagnosis not present

## 2017-08-05 DIAGNOSIS — N183 Chronic kidney disease, stage 3 (moderate): Secondary | ICD-10-CM | POA: Diagnosis not present

## 2017-08-05 DIAGNOSIS — Z8546 Personal history of malignant neoplasm of prostate: Secondary | ICD-10-CM | POA: Diagnosis not present

## 2017-08-05 DIAGNOSIS — E1165 Type 2 diabetes mellitus with hyperglycemia: Secondary | ICD-10-CM | POA: Diagnosis not present

## 2017-08-05 DIAGNOSIS — E7800 Pure hypercholesterolemia, unspecified: Secondary | ICD-10-CM | POA: Diagnosis not present

## 2017-08-05 DIAGNOSIS — E118 Type 2 diabetes mellitus with unspecified complications: Secondary | ICD-10-CM | POA: Diagnosis not present

## 2017-11-02 DIAGNOSIS — E118 Type 2 diabetes mellitus with unspecified complications: Secondary | ICD-10-CM | POA: Diagnosis not present

## 2017-11-09 DIAGNOSIS — E1165 Type 2 diabetes mellitus with hyperglycemia: Secondary | ICD-10-CM | POA: Diagnosis not present

## 2017-11-09 DIAGNOSIS — K219 Gastro-esophageal reflux disease without esophagitis: Secondary | ICD-10-CM | POA: Diagnosis not present

## 2017-11-09 DIAGNOSIS — E7800 Pure hypercholesterolemia, unspecified: Secondary | ICD-10-CM | POA: Diagnosis not present

## 2017-11-09 DIAGNOSIS — I10 Essential (primary) hypertension: Secondary | ICD-10-CM | POA: Diagnosis not present

## 2017-11-16 DIAGNOSIS — E113393 Type 2 diabetes mellitus with moderate nonproliferative diabetic retinopathy without macular edema, bilateral: Secondary | ICD-10-CM | POA: Diagnosis not present

## 2018-01-24 DIAGNOSIS — C61 Malignant neoplasm of prostate: Secondary | ICD-10-CM | POA: Diagnosis not present

## 2018-02-01 DIAGNOSIS — N200 Calculus of kidney: Secondary | ICD-10-CM | POA: Diagnosis not present

## 2018-02-01 DIAGNOSIS — C61 Malignant neoplasm of prostate: Secondary | ICD-10-CM | POA: Diagnosis not present

## 2018-02-07 IMAGING — DX DG HIP (WITH OR WITHOUT PELVIS) 2-3V*R*
4 series · 4 of 4 positions shown · non-contrast
Comparison: None.

CLINICAL DATA: Right-sided hip pain after fall on concrete.

EXAM:
DG HIP (WITH OR WITHOUT PELVIS) 2-3V RIGHT

[pelvis ap (1 of 2)]
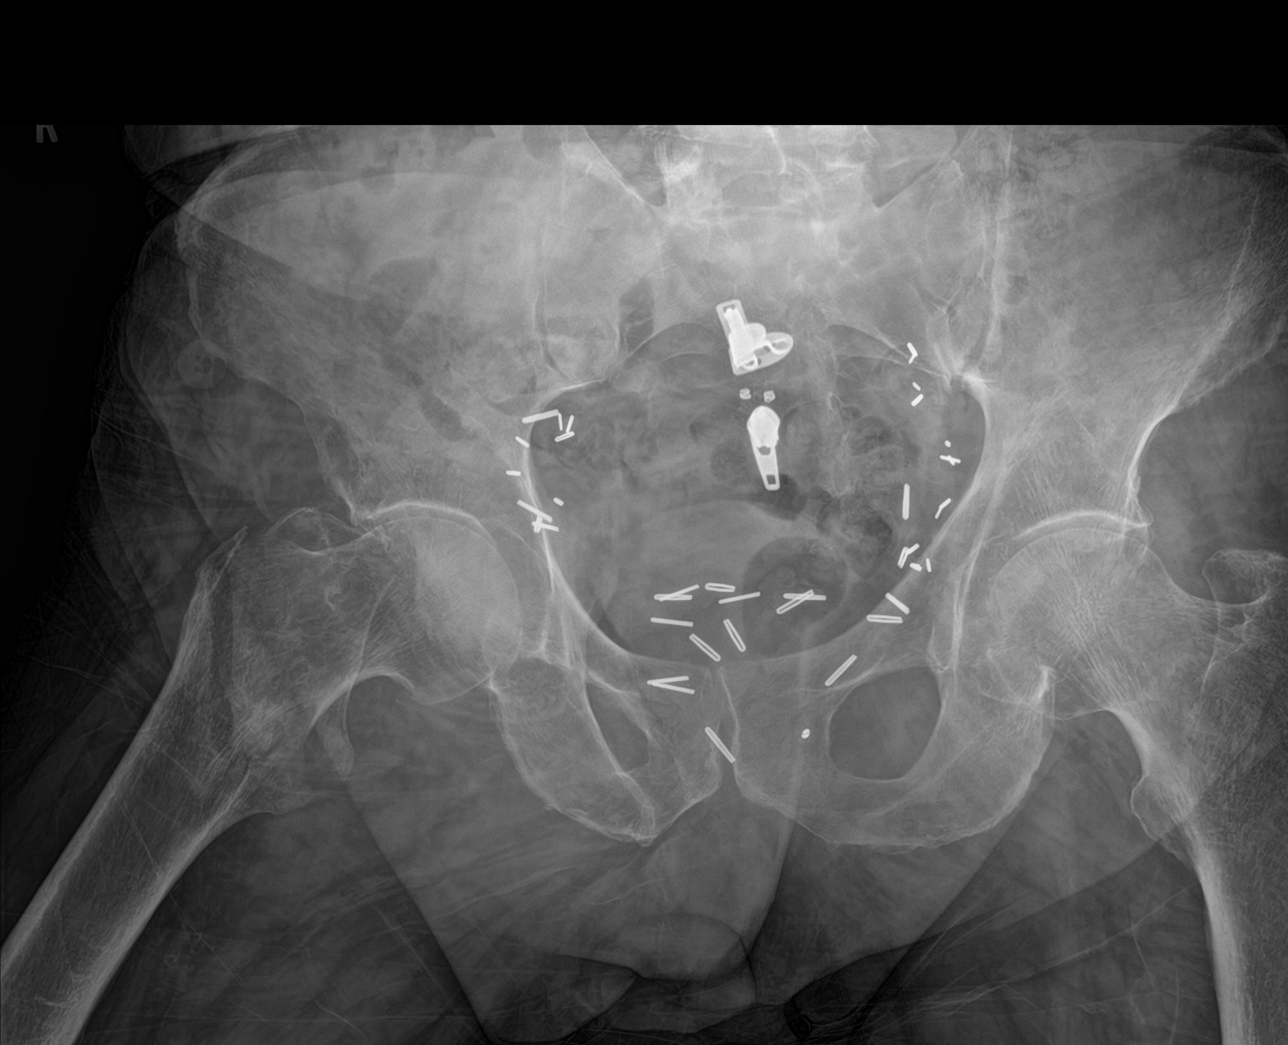

[hip ap]
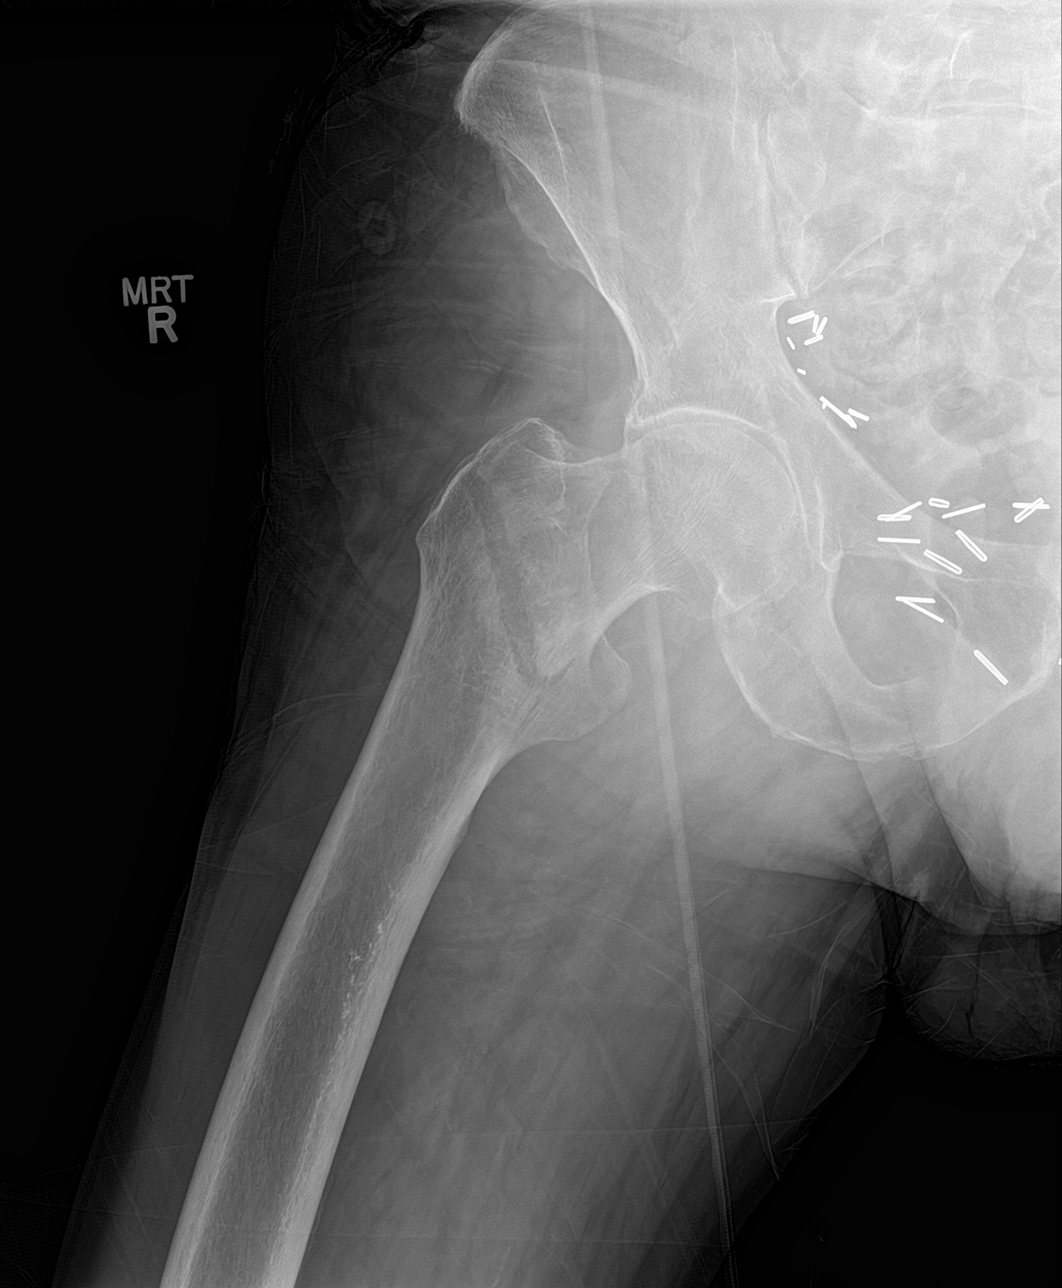

[hip lat]
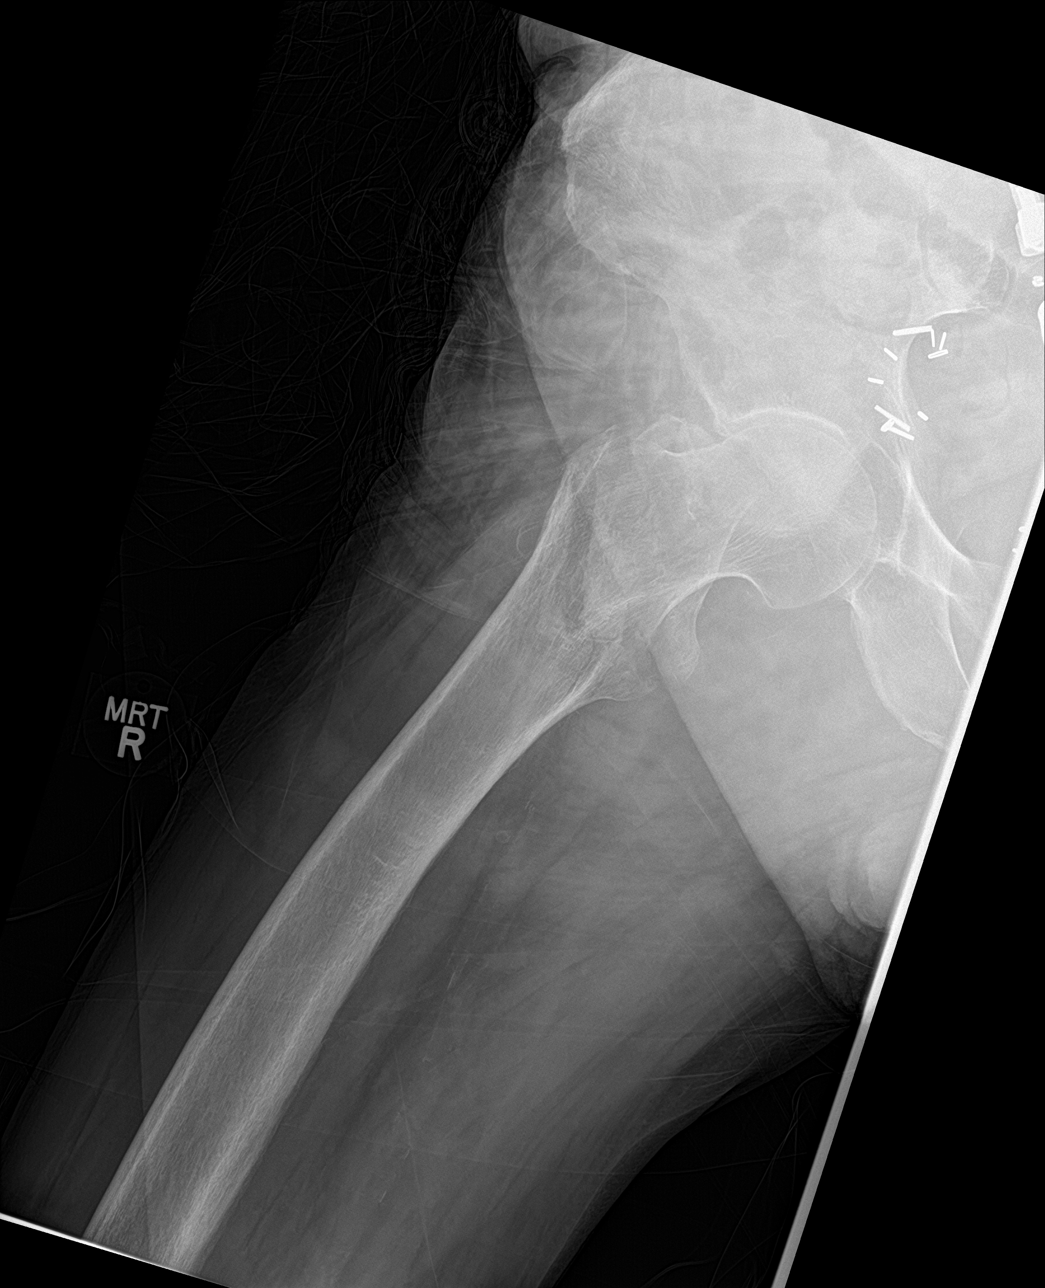

[pelvis ap (2 of 2)]
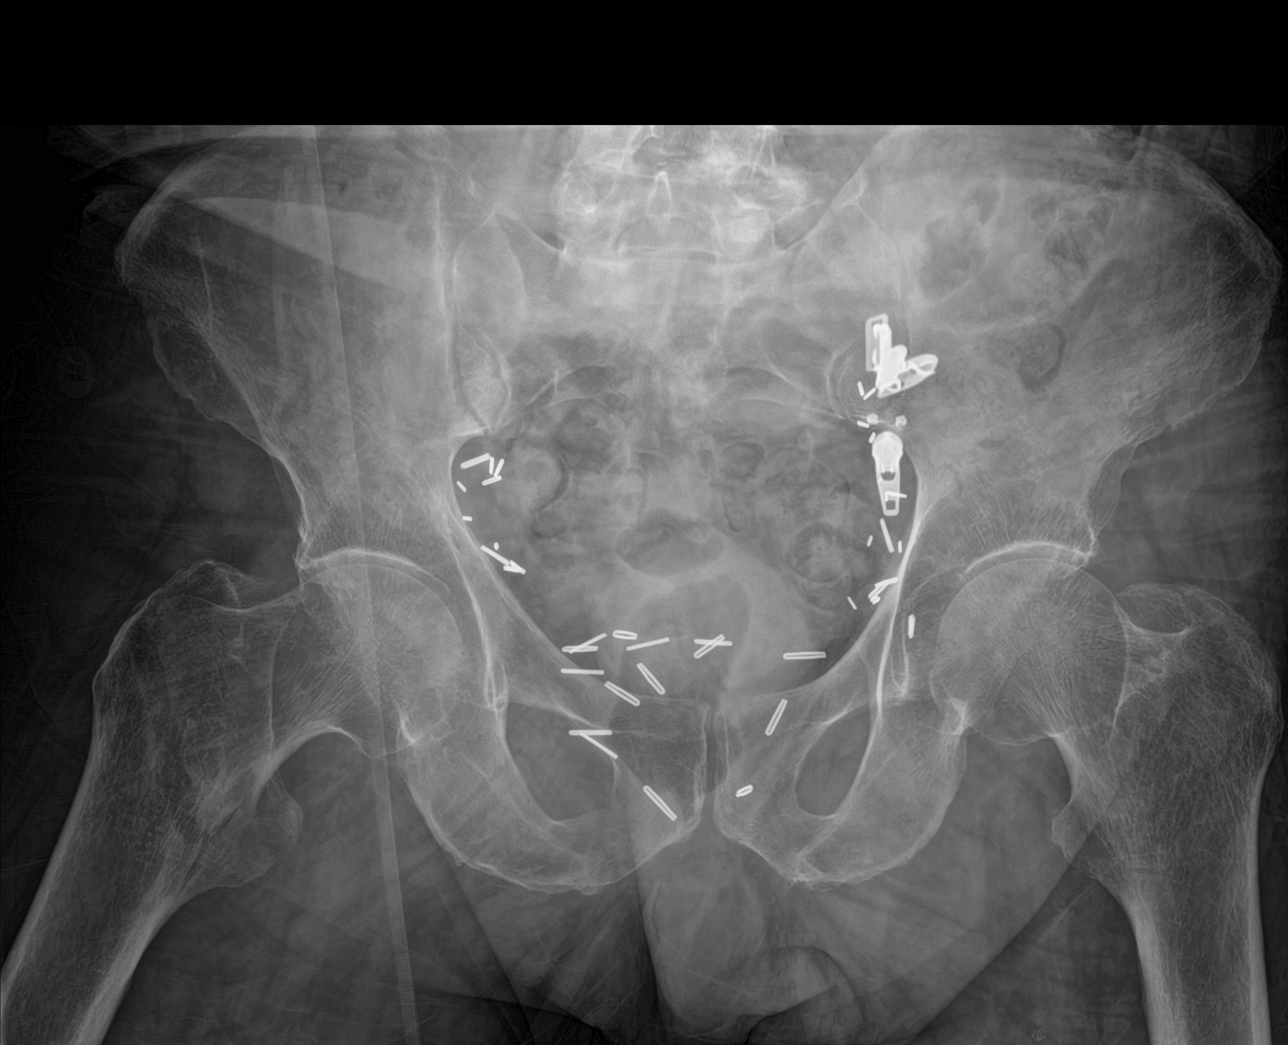

[4 of 4 positions shown; findings below may reference images not displayed]

FINDINGS: Frontal pelvis with AP and frog-leg lateral views of the right hip
show intertrochanteric right femoral neck fracture. Numerous
surgical clips over the pelvis suggest prior lymph node dissection.
Bones are demineralized.
IMPRESSION: Intertrochanteric right proximal femur fracture.

## 2018-02-07 IMAGING — CR DG CHEST 1V PORT
1 series · 1 of 1 positions shown · non-contrast
Comparison: 01/29/2012

CLINICAL DATA: Pre-op imaging for fractured hip surgery tomorrow.
Hx of HTN, diabetes, stroke, prostate cancer.

EXAM:
PORTABLE CHEST 1 VIEW

[AP]
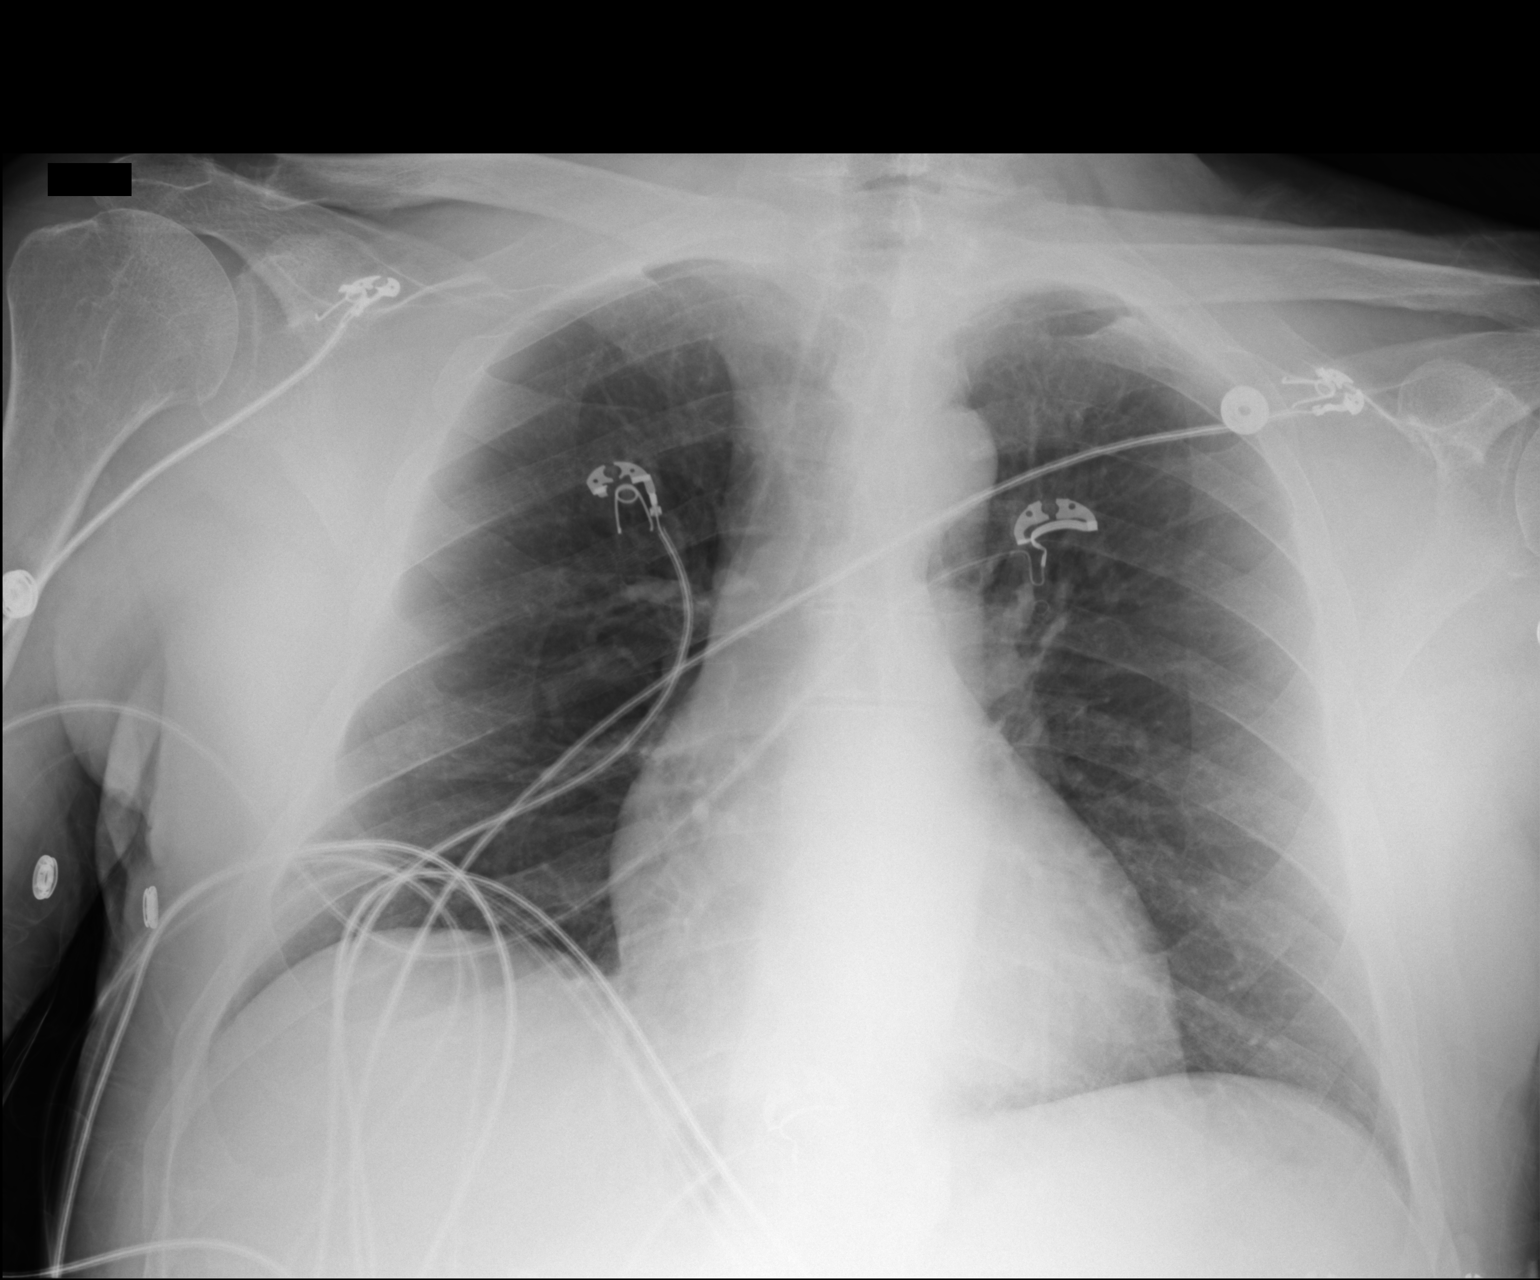

[1 of 1 positions shown; findings below may reference images not displayed]

FINDINGS: The heart size and mediastinal contours are within normal limits.
Both lungs are clear. The visualized skeletal structures are
unremarkable.
IMPRESSION: No active disease.

## 2018-02-23 DIAGNOSIS — N200 Calculus of kidney: Secondary | ICD-10-CM | POA: Diagnosis not present

## 2018-04-06 DIAGNOSIS — F329 Major depressive disorder, single episode, unspecified: Secondary | ICD-10-CM | POA: Diagnosis not present

## 2018-04-19 DIAGNOSIS — F329 Major depressive disorder, single episode, unspecified: Secondary | ICD-10-CM | POA: Diagnosis not present

## 2018-05-19 DIAGNOSIS — Z0000 Encounter for general adult medical examination without abnormal findings: Secondary | ICD-10-CM | POA: Diagnosis not present

## 2018-05-19 DIAGNOSIS — E1165 Type 2 diabetes mellitus with hyperglycemia: Secondary | ICD-10-CM | POA: Diagnosis not present

## 2018-05-24 DIAGNOSIS — Z0000 Encounter for general adult medical examination without abnormal findings: Secondary | ICD-10-CM | POA: Diagnosis not present

## 2018-05-24 DIAGNOSIS — N183 Chronic kidney disease, stage 3 (moderate): Secondary | ICD-10-CM | POA: Diagnosis not present

## 2018-05-24 DIAGNOSIS — I10 Essential (primary) hypertension: Secondary | ICD-10-CM | POA: Diagnosis not present

## 2018-05-24 DIAGNOSIS — Z23 Encounter for immunization: Secondary | ICD-10-CM | POA: Diagnosis not present

## 2018-05-24 DIAGNOSIS — F329 Major depressive disorder, single episode, unspecified: Secondary | ICD-10-CM | POA: Diagnosis not present

## 2018-05-24 DIAGNOSIS — E7800 Pure hypercholesterolemia, unspecified: Secondary | ICD-10-CM | POA: Diagnosis not present

## 2018-05-24 DIAGNOSIS — Z8546 Personal history of malignant neoplasm of prostate: Secondary | ICD-10-CM | POA: Diagnosis not present

## 2018-05-24 DIAGNOSIS — E1165 Type 2 diabetes mellitus with hyperglycemia: Secondary | ICD-10-CM | POA: Diagnosis not present

## 2018-06-12 ENCOUNTER — Observation Stay (HOSPITAL_COMMUNITY)
Admission: EM | Admit: 2018-06-12 | Discharge: 2018-06-15 | Payer: PPO | Attending: Internal Medicine | Admitting: Internal Medicine

## 2018-06-12 ENCOUNTER — Encounter (HOSPITAL_COMMUNITY): Payer: Self-pay

## 2018-06-12 ENCOUNTER — Emergency Department (HOSPITAL_COMMUNITY): Payer: PPO

## 2018-06-12 DIAGNOSIS — E669 Obesity, unspecified: Secondary | ICD-10-CM | POA: Diagnosis not present

## 2018-06-12 DIAGNOSIS — D696 Thrombocytopenia, unspecified: Secondary | ICD-10-CM | POA: Diagnosis not present

## 2018-06-12 DIAGNOSIS — I10 Essential (primary) hypertension: Secondary | ICD-10-CM | POA: Diagnosis not present

## 2018-06-12 DIAGNOSIS — S329XXA Fracture of unspecified parts of lumbosacral spine and pelvis, initial encounter for closed fracture: Secondary | ICD-10-CM

## 2018-06-12 DIAGNOSIS — Z791 Long term (current) use of non-steroidal anti-inflammatories (NSAID): Secondary | ICD-10-CM | POA: Insufficient documentation

## 2018-06-12 DIAGNOSIS — W1830XA Fall on same level, unspecified, initial encounter: Secondary | ICD-10-CM | POA: Insufficient documentation

## 2018-06-12 DIAGNOSIS — R1031 Right lower quadrant pain: Secondary | ICD-10-CM | POA: Diagnosis present

## 2018-06-12 DIAGNOSIS — Z6823 Body mass index (BMI) 23.0-23.9, adult: Secondary | ICD-10-CM | POA: Insufficient documentation

## 2018-06-12 DIAGNOSIS — E1129 Type 2 diabetes mellitus with other diabetic kidney complication: Secondary | ICD-10-CM | POA: Diagnosis present

## 2018-06-12 DIAGNOSIS — S32110A Nondisplaced Zone I fracture of sacrum, initial encounter for closed fracture: Secondary | ICD-10-CM | POA: Diagnosis not present

## 2018-06-12 DIAGNOSIS — S79911A Unspecified injury of right hip, initial encounter: Secondary | ICD-10-CM | POA: Diagnosis not present

## 2018-06-12 DIAGNOSIS — Z794 Long term (current) use of insulin: Secondary | ICD-10-CM | POA: Diagnosis not present

## 2018-06-12 DIAGNOSIS — N183 Chronic kidney disease, stage 3 unspecified: Secondary | ICD-10-CM | POA: Diagnosis present

## 2018-06-12 DIAGNOSIS — F329 Major depressive disorder, single episode, unspecified: Secondary | ICD-10-CM | POA: Insufficient documentation

## 2018-06-12 DIAGNOSIS — M25551 Pain in right hip: Secondary | ICD-10-CM

## 2018-06-12 DIAGNOSIS — I129 Hypertensive chronic kidney disease with stage 1 through stage 4 chronic kidney disease, or unspecified chronic kidney disease: Secondary | ICD-10-CM | POA: Diagnosis not present

## 2018-06-12 DIAGNOSIS — E1122 Type 2 diabetes mellitus with diabetic chronic kidney disease: Secondary | ICD-10-CM | POA: Insufficient documentation

## 2018-06-12 DIAGNOSIS — S3210XA Unspecified fracture of sacrum, initial encounter for closed fracture: Secondary | ICD-10-CM | POA: Diagnosis not present

## 2018-06-12 DIAGNOSIS — E785 Hyperlipidemia, unspecified: Secondary | ICD-10-CM | POA: Diagnosis not present

## 2018-06-12 DIAGNOSIS — S32501A Unspecified fracture of right pubis, initial encounter for closed fracture: Principal | ICD-10-CM | POA: Insufficient documentation

## 2018-06-12 DIAGNOSIS — R102 Pelvic and perineal pain: Secondary | ICD-10-CM | POA: Diagnosis not present

## 2018-06-12 DIAGNOSIS — D72829 Elevated white blood cell count, unspecified: Secondary | ICD-10-CM | POA: Diagnosis not present

## 2018-06-12 DIAGNOSIS — Z87891 Personal history of nicotine dependence: Secondary | ICD-10-CM | POA: Diagnosis not present

## 2018-06-12 DIAGNOSIS — Z8673 Personal history of transient ischemic attack (TIA), and cerebral infarction without residual deficits: Secondary | ICD-10-CM

## 2018-06-12 DIAGNOSIS — S32511A Fracture of superior rim of right pubis, initial encounter for closed fracture: Secondary | ICD-10-CM | POA: Diagnosis not present

## 2018-06-12 DIAGNOSIS — S32591A Other specified fracture of right pubis, initial encounter for closed fracture: Secondary | ICD-10-CM | POA: Diagnosis not present

## 2018-06-12 DIAGNOSIS — Z8546 Personal history of malignant neoplasm of prostate: Secondary | ICD-10-CM | POA: Diagnosis not present

## 2018-06-12 DIAGNOSIS — E1169 Type 2 diabetes mellitus with other specified complication: Secondary | ICD-10-CM

## 2018-06-12 DIAGNOSIS — D638 Anemia in other chronic diseases classified elsewhere: Secondary | ICD-10-CM

## 2018-06-12 DIAGNOSIS — Z79899 Other long term (current) drug therapy: Secondary | ICD-10-CM | POA: Diagnosis not present

## 2018-06-12 DIAGNOSIS — E119 Type 2 diabetes mellitus without complications: Secondary | ICD-10-CM

## 2018-06-12 DIAGNOSIS — W19XXXA Unspecified fall, initial encounter: Secondary | ICD-10-CM | POA: Diagnosis not present

## 2018-06-12 LAB — CBC WITH DIFFERENTIAL/PLATELET
Abs Immature Granulocytes: 0.09 10*3/uL — ABNORMAL HIGH (ref 0.00–0.07)
Basophils Absolute: 0 10*3/uL (ref 0.0–0.1)
Basophils Relative: 0 %
Eosinophils Absolute: 0 10*3/uL (ref 0.0–0.5)
Eosinophils Relative: 0 %
HCT: 35.8 % — ABNORMAL LOW (ref 39.0–52.0)
Hemoglobin: 11.9 g/dL — ABNORMAL LOW (ref 13.0–17.0)
Immature Granulocytes: 1 %
Lymphocytes Relative: 7 %
Lymphs Abs: 0.9 10*3/uL (ref 0.7–4.0)
MCH: 32.1 pg (ref 26.0–34.0)
MCHC: 33.2 g/dL (ref 30.0–36.0)
MCV: 96.5 fL (ref 80.0–100.0)
Monocytes Absolute: 0.8 10*3/uL (ref 0.1–1.0)
Monocytes Relative: 6 %
Neutro Abs: 11.1 10*3/uL — ABNORMAL HIGH (ref 1.7–7.7)
Neutrophils Relative %: 86 %
Platelets: 154 10*3/uL (ref 150–400)
RBC: 3.71 MIL/uL — ABNORMAL LOW (ref 4.22–5.81)
RDW: 12.9 % (ref 11.5–15.5)
WBC: 12.9 10*3/uL — ABNORMAL HIGH (ref 4.0–10.5)
nRBC: 0 % (ref 0.0–0.2)

## 2018-06-12 LAB — BASIC METABOLIC PANEL
Anion gap: 9 (ref 5–15)
BUN: 28 mg/dL — ABNORMAL HIGH (ref 8–23)
CO2: 20 mmol/L — ABNORMAL LOW (ref 22–32)
Calcium: 9.3 mg/dL (ref 8.9–10.3)
Chloride: 111 mmol/L (ref 98–111)
Creatinine, Ser: 1.44 mg/dL — ABNORMAL HIGH (ref 0.61–1.24)
GFR calc Af Amer: 50 mL/min — ABNORMAL LOW (ref >60)
GFR calc non Af Amer: 43 mL/min — ABNORMAL LOW (ref >60)
Glucose, Bld: 79 mg/dL (ref 70–99)
Potassium: 4 mmol/L (ref 3.5–5.1)
Sodium: 140 mmol/L (ref 135–145)

## 2018-06-12 LAB — URINALYSIS, ROUTINE W REFLEX MICROSCOPIC
Bilirubin Urine: NEGATIVE
Glucose, UA: NEGATIVE mg/dL
Ketones, ur: 5 mg/dL — AB
Leukocytes, UA: NEGATIVE
Nitrite: NEGATIVE
Protein, ur: NEGATIVE mg/dL
Specific Gravity, Urine: 1.014 (ref 1.005–1.030)
pH: 5 (ref 5.0–8.0)

## 2018-06-12 LAB — GLUCOSE, CAPILLARY: Glucose-Capillary: 200 mg/dL — ABNORMAL HIGH (ref 70–99)

## 2018-06-12 MED ORDER — SERTRALINE HCL 100 MG PO TABS
100.0000 mg | ORAL_TABLET | Freq: Every day | ORAL | Status: DC
  Administered 2018-06-13 – 2018-06-15 (×3): 100 mg via ORAL
  Filled 2018-06-12 (×3): qty 1

## 2018-06-12 MED ORDER — METHOCARBAMOL 500 MG PO TABS
500.0000 mg | ORAL_TABLET | Freq: Three times a day (TID) | ORAL | Status: DC | PRN
  Administered 2018-06-13 – 2018-06-14 (×2): 500 mg via ORAL
  Filled 2018-06-12 (×3): qty 1

## 2018-06-12 MED ORDER — CLOPIDOGREL BISULFATE 75 MG PO TABS
75.0000 mg | ORAL_TABLET | Freq: Every day | ORAL | Status: DC
  Administered 2018-06-13 – 2018-06-15 (×3): 75 mg via ORAL
  Filled 2018-06-12 (×3): qty 1

## 2018-06-12 MED ORDER — AMLODIPINE BESYLATE 2.5 MG PO TABS
2.5000 mg | ORAL_TABLET | Freq: Every day | ORAL | Status: DC
  Administered 2018-06-12 – 2018-06-14 (×3): 2.5 mg via ORAL
  Filled 2018-06-12 (×3): qty 1

## 2018-06-12 MED ORDER — MELATONIN 3 MG PO TABS
ORAL_TABLET | Freq: Every day | ORAL | Status: DC
  Filled 2018-06-12: qty 100

## 2018-06-12 MED ORDER — DOCUSATE SODIUM 100 MG PO CAPS
100.0000 mg | ORAL_CAPSULE | Freq: Two times a day (BID) | ORAL | Status: DC
  Administered 2018-06-12 – 2018-06-15 (×6): 100 mg via ORAL
  Filled 2018-06-12 (×6): qty 1

## 2018-06-12 MED ORDER — INSULIN ASPART 100 UNIT/ML ~~LOC~~ SOLN
0.0000 [IU] | Freq: Three times a day (TID) | SUBCUTANEOUS | Status: DC
  Administered 2018-06-13: 1 [IU] via SUBCUTANEOUS
  Administered 2018-06-13: 2 [IU] via SUBCUTANEOUS
  Administered 2018-06-14: 1 [IU] via SUBCUTANEOUS
  Administered 2018-06-14: 2 [IU] via SUBCUTANEOUS
  Administered 2018-06-15: 1 [IU] via SUBCUTANEOUS
  Administered 2018-06-15: 3 [IU] via SUBCUTANEOUS
  Administered 2018-06-15: 1 [IU] via SUBCUTANEOUS

## 2018-06-12 MED ORDER — INSULIN ASPART 100 UNIT/ML ~~LOC~~ SOLN: 0.0000 [IU] | Freq: Every day | SUBCUTANEOUS | Status: DC

## 2018-06-12 MED ORDER — VITAMIN B-12 1000 MCG PO TABS
1000.0000 ug | ORAL_TABLET | Freq: Every day | ORAL | Status: DC
  Administered 2018-06-13 – 2018-06-15 (×3): 1000 ug via ORAL
  Filled 2018-06-12 (×3): qty 1

## 2018-06-12 MED ORDER — HYDRALAZINE HCL 20 MG/ML IJ SOLN: 5.0000 mg | INTRAMUSCULAR | Status: DC | PRN

## 2018-06-12 MED ORDER — VITAMIN D 25 MCG (1000 UNIT) PO TABS
1000.0000 [IU] | ORAL_TABLET | Freq: Every day | ORAL | Status: DC
  Administered 2018-06-12 – 2018-06-14 (×3): 1000 [IU] via ORAL
  Filled 2018-06-12 (×3): qty 1

## 2018-06-12 MED ORDER — LOSARTAN POTASSIUM 50 MG PO TABS
50.0000 mg | ORAL_TABLET | Freq: Every day | ORAL | Status: DC
  Administered 2018-06-13 – 2018-06-15 (×3): 50 mg via ORAL
  Filled 2018-06-12 (×3): qty 1

## 2018-06-12 MED ORDER — PANTOPRAZOLE SODIUM 40 MG PO TBEC: 40.0000 mg | DELAYED_RELEASE_TABLET | Freq: Every day | ORAL | Status: DC | PRN

## 2018-06-12 MED ORDER — POLYETHYLENE GLYCOL 3350 17 G PO PACK
17.0000 g | PACK | Freq: Every day | ORAL | Status: DC | PRN
  Administered 2018-06-13: 17 g via ORAL
  Filled 2018-06-12: qty 1

## 2018-06-12 MED ORDER — HEPARIN SODIUM (PORCINE) 5000 UNIT/ML IJ SOLN
5000.0000 [IU] | Freq: Three times a day (TID) | INTRAMUSCULAR | Status: DC
  Administered 2018-06-12 – 2018-06-15 (×9): 5000 [IU] via SUBCUTANEOUS
  Filled 2018-06-12 (×9): qty 1

## 2018-06-12 MED ORDER — OXYCODONE-ACETAMINOPHEN 5-325 MG PO TABS
1.0000 | ORAL_TABLET | Freq: Four times a day (QID) | ORAL | Status: DC | PRN
  Administered 2018-06-13 (×2): 1 via ORAL
  Filled 2018-06-12 (×2): qty 1

## 2018-06-12 MED ORDER — MORPHINE SULFATE (PF) 2 MG/ML IV SOLN: 0.5000 mg | INTRAVENOUS | Status: DC | PRN

## 2018-06-12 MED ORDER — FLAXSEED OIL 1000 MG PO CAPS: 1000.0000 mg | ORAL_CAPSULE | Freq: Every day | ORAL | Status: DC

## 2018-06-12 MED ORDER — ACETAMINOPHEN 500 MG PO TABS
1000.0000 mg | ORAL_TABLET | Freq: Once | ORAL | Status: AC
Start: 2018-06-12 — End: 2018-06-12
  Administered 2018-06-12: 1000 mg via ORAL
  Filled 2018-06-12: qty 2

## 2018-06-12 MED ORDER — MELATONIN 3 MG PO TABS
3.0000 mg | ORAL_TABLET | Freq: Every day | ORAL | Status: DC
  Administered 2018-06-12 – 2018-06-14 (×3): 3 mg via ORAL
  Filled 2018-06-12 (×4): qty 1

## 2018-06-12 MED ORDER — ATORVASTATIN CALCIUM 10 MG PO TABS
20.0000 mg | ORAL_TABLET | Freq: Every day | ORAL | Status: DC
  Administered 2018-06-13 – 2018-06-15 (×3): 20 mg via ORAL
  Filled 2018-06-12 (×3): qty 2

## 2018-06-12 NOTE — ED Triage Notes (Signed)
Pt at church, walked out of BR using 4 prong cane.  No LOC, head injury.  1 cm skin tear on right elbow. Pain to right hip.  Pain worse when bearing weight on right leg,.

## 2018-06-12 NOTE — ED Notes (Signed)
Dinner tray ordered, [employer], mayo/ ff

## 2018-06-12 NOTE — ED Provider Notes (Signed)
Presents with acute ND pelvic fractures. Labs ordered but not yet done. No additional scans indicated.  Hospitalist aware but labs are pending at this time.  Patient is stable.  No evidence for additional trauma.  Plan: Follow-up labs and speak to hospitalist regarding admission for PT/pain control.  MDM:  Labs resulted and unremarkable.  Spoke to hospitalist and then subsequently spoke to orthopedic surgery, Dr. Doran Durand, who confirmed this is likely all nonoperative management.  They will follow with the patient & see him tomorrow.  Admitted to the hospitalist service thereafter in stable condition.   Louellen Molder, MD 06/12/18 2113    Carmin Muskrat, MD 06/16/18 1137

## 2018-06-12 NOTE — H&P (Signed)
History and Physical    Tyler [name] ZSW:[id number] DOB: [birth year] DOA: 06/12/2018  Referring MD/NP/PA:   PCP: [name], MD   Patient coming from:  The patient is coming from home.  At baseline, pt is independent for most of ADL.        Chief Complaint: fall and pain in right hip and groin area  HPI: Tyler [name] is a 85 y.o. male with medical history significant of hypertension, hyperlipidemia, diabetes mellitus, stroke, GERD, CKD 3, prostate cancer, depression, who presents with a fall, right hip pain and right groin pain.  Pt states that he was at a church, and accidentally fell when he walked out of bathroom using 4 prong cane, injured his right hip, caused severe pain in the right hip and groin area.  The pain is constant, moderate, sharp, nonradiating, aggravated by movement.  He cannot bear weight on this leg.  Patient strongly denies any head or neck injury.  No loss of consciousness.  He also has small skin laceration in the right elbow. Patient denies any chest pain, shortness breath, cough.  No fever or chills.  He is constipated, but no nausea vomiting, diarrhea or abdominal pain.  Patient states that he has increased urinary frequency, but no dysuria or burning on urination.  ED Course: pt was found to have WBC 12.9, negative urinalysis, stable renal function, temperature normal, no tachycardia, oxygen saturation 99% on room air.  Patient is placed on MedSurg bed for observation. CT-right hip showed pelvic fracture. Orthopedic surgeon, Dr. Doran Durand was consulted by EDP.  CT scan: 1. Acute nondisplaced fractures of the right sacral ala, right superior pubic ramus, and right inferior pubic ramus. 2. Degenerative loss of intervertebral disc height at L4-5. 3. Aortic Atherosclerosis (ICD10-I70.0).  Review of Systems:   General: no fevers, chills, no body weight gain, has fatigue HEENT: no blurry vision, hearing changes or sore throat Respiratory: no dyspnea, coughing,  wheezing CV: no chest pain, no palpitations GI: no nausea, vomiting, abdominal pain, diarrhea, constipation GU: no dysuria, burning on urination, increased urinary frequency, hematuria  Ext: no leg edema Neuro: no unilateral weakness, numbness, or tingling, no vision change or hearing loss. Had fall.  Skin: no rash. Has skin laceration in the right elbow MSK: has pain in right hip and right groin area. Heme: No easy bruising.  Travel history: No recent long distant travel.  Allergy:  Allergies  Allergen Reactions  . Sulfa Drugs Cross Reactors Anaphylaxis and Rash    Lungs fill with fluid    Past Medical History:  Diagnosis Date  . Cancer [facility])    Prostate  . Diabetes mellitus without complication ([place])   . Hypertension   . Kidney stones   . Stroke [facility])     Past Surgical History:  Procedure Laterality Date  . CARDIAC CATHETERIZATION    . FEMUR IM NAIL Right 11/17/2015   Procedure: INTRAMEDULLARY (IM) NAIL FEMORAL;  Surgeon: Marybelle Killings, MD;  Location: [place];  Service: Orthopedics;  Laterality: Right;  . PROSTATE SURGERY      Social History:  reports that he quit smoking about 50 years ago. He has never used smokeless tobacco. He reports that he does not drink alcohol or use drugs.  Family History:  Family History  Problem Relation Age of Onset  . Emphysema Father   . Heart Problems Mother      Prior to Admission medications   Medication Sig Start Date End Date Taking? Authorizing Provider  amLODipine (NORVASC) 2.5 MG tablet Take 2.5 mg by mouth at bedtime.  09/19/12  Yes [provider]  atorvastatin (LIPITOR) 20 MG tablet Take 20 mg by mouth daily.    Yes [provider]  cholecalciferol (VITAMIN D) 1000 UNITS tablet Take 1,000 Units by mouth at bedtime.    Yes [provider]  clopidogrel (PLAVIX) 75 MG tablet Take 1 tablet (75 mg total) by mouth daily with breakfast. 03/20/15  Yes Dennie Bible, NP  Flaxseed, Linseed, (FLAXSEED  OIL) 1000 MG CAPS Take 1,000 mg by mouth at bedtime.    Yes [provider]  glimepiride (AMARYL) 2 MG tablet Take 2 mg by mouth daily. 03/21/14  Yes [provider]  losartan (COZAAR) 50 MG tablet Take 50 mg by mouth daily.    Yes [provider]  MELATONIN PO Take 1 tablet by mouth at bedtime.   Yes [provider]  sertraline (ZOLOFT) 50 MG tablet Take 100 mg by mouth daily. 04/29/18  Yes [provider]  vitamin B-12 (CYANOCOBALAMIN) 1000 MCG tablet Take 1,000 mcg by mouth daily.   Yes [provider]  docusate sodium (COLACE) 100 MG capsule Take 1 capsule (100 mg total) by mouth 2 (two) times daily. Patient not taking: Reported on 06/12/2018 11/20/15   Hosie Poisson, MD  Lancets [facility] ULTRASOFT) lancets  07/07/12   [provider]  ONE TOUCH ULTRA TEST test strip  09/06/12   [provider]  pantoprazole (PROTONIX) 40 MG tablet Take 1 tablet (40 mg total) by mouth daily. Patient not taking: Reported on 06/12/2018 02/12/12   Cathlyn Parsons, PA-C    Physical Exam: Vitals:   06/12/18 1203 06/12/18 2004 06/12/18 2048  BP: (!) 171/76 (!) 136/52 (!) 148/63  Pulse: 81 85 72  Resp: 17 18   Temp: 98.2 F (36.8 C)  97.7 F (36.5 C)  TempSrc: Oral  Oral  SpO2: 99% 98% 98%  Weight: 74.8 kg  74.8 kg  Height: 5\' 11"  (1.803 m)  5\' 11"  (1.803 m)   General: Not in acute distress HEENT:       Eyes: PERRL, EOMI, no scleral icterus.       ENT: No discharge from the ears and nose, no pharynx injection, no tonsillar enlargement.        Neck: [name], no bruit, no mass felt. Heme: No neck lymph node enlargement. Cardiac: S1/S2, RRR, No murmurs, No gallops or rubs. Respiratory: No rales, wheezing, rhonchi or rubs. GI: Soft, nondistended, nontender, no rebound pain, no organomegaly, BS present. GU: No hematuria Ext: No pitting leg edema bilaterally. 2+DP/PT pulse bilaterally. Musculoskeletal: has pain in right hip and right  groin area. Skin: No rashes. Has small skin laceration right elbow Neuro: Alert, oriented X3, cranial nerves II-XII grossly intact, moves all extremities. Psych: Patient is not psychotic, no suicidal or hemocidal ideation.  Labs on Admission: I have personally reviewed following labs and imaging studies  CBC: Recent Labs  Lab 06/12/18 1744  WBC 12.9*  NEUTROABS 11.1*  HGB 11.9*  HCT 35.8*  MCV 96.5  PLT 751   Basic Metabolic Panel: Recent Labs  Lab 06/12/18 1744  NA 140  K 4.0  CL 111  CO2 20*  GLUCOSE 79  BUN 28*  CREATININE 1.44*  CALCIUM 9.3   GFR: Estimated Creatinine Clearance: 38.2 mL/min (A) (by C-G formula based on SCr of 1.44 mg/dL (H)). Liver Function Tests: No results for input(s): AST, ALT, ALKPHOS, BILITOT, PROT, ALBUMIN  in the last 168 hours. No results for input(s): LIPASE, AMYLASE in the last 168 hours. No results for input(s): AMMONIA in the last 168 hours. Coagulation Profile: No results for input(s): INR, PROTIME in the last 168 hours. Cardiac Enzymes: No results for input(s): CKTOTAL, CKMB, CKMBINDEX, TROPONINI in the last 168 hours. BNP (last 3 results) No results for input(s): PROBNP in the last 8760 hours. HbA1C: No results for input(s): HGBA1C in the last 72 hours. CBG: Recent Labs  Lab 06/12/18 2047  GLUCAP 200*   Lipid Profile: No results for input(s): CHOL, HDL, LDLCALC, TRIG, CHOLHDL, LDLDIRECT in the last 72 hours. Thyroid Function Tests: No results for input(s): TSH, T4TOTAL, FREET4, T3FREE, THYROIDAB in the last 72 hours. Anemia Panel: No results for input(s): VITAMINB12, FOLATE, FERRITIN, TIBC, IRON, RETICCTPCT in the last 72 hours. Urine analysis:    Component Value Date/Time   COLORURINE YELLOW 06/12/2018 1902   APPEARANCEUR CLEAR 06/12/2018 1902   LABSPEC 1.014 06/12/2018 1902   PHURINE 5.0 06/12/2018 1902   GLUCOSEU NEGATIVE 06/12/2018 1902   HGBUR SMALL (A) 06/12/2018 1902   BILIRUBINUR NEGATIVE 06/12/2018 1902    KETONESUR 5 (A) 06/12/2018 1902   PROTEINUR NEGATIVE 06/12/2018 1902   UROBILINOGEN 1.0 03/13/2008 1130   NITRITE NEGATIVE 06/12/2018 1902   LEUKOCYTESUR NEGATIVE 06/12/2018 1902   Sepsis Labs: @LABRCNTIP (procalcitonin:4,lacticidven:4) )No results found for this or any previous visit (from the past 240 hour(s)).   Radiological Exams on Admission: Ct Hip Right Wo Contrast  Result Date: 06/12/2018 CLINICAL DATA:  Right hip pain after fall at church. EXAM: CT OF THE RIGHT HIP WITHOUT CONTRAST TECHNIQUE: Multidetector CT imaging of the right hip was performed according to the standard protocol. Multiplanar CT image reconstructions were also generated. COMPARISON:  Radiographs from 06/12/2018 FINDINGS: Bones/Joint/Cartilage Nondisplaced fracture of the right sacral ala, image 34/11. Nondisplaced fracture of the right superior pubic ramus, image 75/11. Nondisplaced fracture of the right inferior pubic ramus, image 100/11. Right hip screw with IM rod noted. No femur fracture is observed in the visualized part of the femur, we included down to the distal metaphysis but not quite all the way through the stem of the implant. There is a single distal interlocking screw noted. Spurring along the lesser trochanter. Degenerative loss of intervertebral disc height at L4-5. Ligaments Suboptimally assessed by CT. Muscles and Tendons Unremarkable Soft tissues Aortoiliac atherosclerotic vascular disease. Prior lymph node dissection in the right hemipelvis. Suspected prostatectomy. IMPRESSION: 1. Acute nondisplaced fractures of the right sacral ala, right superior pubic ramus, and right inferior pubic ramus. 2. Degenerative loss of intervertebral disc height at L4-5. 3.  Aortic Atherosclerosis (ICD10-I70.0). Electronically Signed   By: Van Clines M.D.   On: 06/12/2018 16:24   Dg Hip Unilat W Or Wo Pelvis 2-3 Views Right  Result Date: 06/12/2018 CLINICAL DATA:  Patient status post fall.  Right leg pain. EXAM: DG  HIP (WITH OR WITHOUT PELVIS) 2-3V RIGHT COMPARISON:  None. FINDINGS: Intramedullary rod within the right femur. No evidence for acute displaced femur fracture. Well corticated ossific density adjacent to the lesser tuberosity. Regional soft tissues are unremarkable. Hardware appears intact. Pelvic surgical clips. Lumbar spine degenerative changes. SI joints unremarkable. Stool throughout the colon. IMPRESSION: No evidence for acute displaced fracture. Well corticated ossific density adjacent to the lesser trochanter, potentially sequelae of prior trauma. Recommend correlation for point tenderness to exclude the possibility of avulsion injury. Electronically Signed   By: Lovey Newcomer M.D.   On: 06/12/2018 14:24  EKG:  Not done in ED, will get one.   Assessment/Plan Principal Problem:   Closed fracture of pubic ramus, right, initial encounter ([place]) Active Problems:   History of stroke   Hyperlipidemia   Essential hypertension   CKD (chronic kidney disease), stage III (HCC)   Type II diabetes mellitus with renal manifestations ([place])   Fall   Leukocytosis   Closed fracture of pubic ramus, right, initial encounter [facility]): CT scan showed acute nondisplaced fractures of the right sacral ala, right superior pubic ramus, and right inferior pubic ramus. Patient has moderate pain now. No neurovascular compromise. Orthopedic surgeon, Dr. Doran Durand was consulted, will see pt in AM  - will place to Med-surg bed for obs - Pain control: morphine prn and percocet - When necessary Zofran for nausea - Robaxin for muscle spasm - PT/OT when able to (not ordered now) - Keep NPO after MN in case pt need surgery (most likely not)  Leukocytosis: Likely due to stress-induced demargination. Patient does not have signs of infection. -Follow-up CBC  History of stroke: -Continue Plavix, Lipitor  Hyperlipidemia: -lipitor  HTN:  -Continue home medications: Amlodipine, Cozaar-IV hydralazine prn  CKD (chronic  kidney disease), stage III ([place]):  -f/u by BMP  Type II diabetes mellitus with renal manifestations ([place]): Last A1c 6.8 on 06/29/16, well controled. Patient is taking Amaryl at home -SSI  Fall: pt seems to have had a mechanical fall.  No head or neck injury. -PT/OT -Rehab placement    DVT ppx: SQ Heparin   Code Status: Full code Family Communication: None at bed side.    Disposition Plan:  Anticipate discharge rehab, Consults called:  none Admission status:  medical floor/obs       Date of Service 06/12/2018    [facility]   If 7PM-7AM, please contact night-coverage [email or web address] Password [email or web address] 06/12/2018, 10:16 PM

## 2018-06-12 NOTE — ED Provider Notes (Signed)
[facility] EMERGENCY DEPARTMENT Provider Note   CSN: [id number] Arrival date & time: 06/12/18  1151     History   Chief Complaint Chief Complaint  Patient presents with  . Fall  . Abrasion    HPI Tyler [name] is a 85 y.o. male.  HPI Patient presents to the emergency department with right hip pain following a fall.  The patient had an intramedullary rod placed after previous injury.  The patient states that he has pain when he tries to stand.  Patient states he has no other injuries.  She states he did not lose consciousness or hit his head.  The patient denies chest pain, shortness of breath, headache,blurred vision, neck pain, fever, cough, weakness, numbness, dizziness, anorexia, edema, abdominal pain, nausea, vomiting, diarrhea, rash, back pain, dysuria, hematemesis, bloody stool, near syncope, or syncope. Past Medical History:  Diagnosis Date  . Cancer [facility])    Prostate  . Diabetes mellitus without complication ([place])   . Hypertension   . Kidney stones   . Stroke [facility])     Patient Active Problem List   Diagnosis Date Noted  . TIA (transient ischemic attack) 06/28/2016  . CKD (chronic kidney disease), stage III ([place]) 06/28/2016  . Microscopic hematuria 06/28/2016  . Femur fracture, right ([name]) 11/16/2015  . History of nephrolithiasis 11/16/2015  . Pseudobulbar affect 06/07/2014  . Essential hypertension   . Memory loss   . Speech abnormality 01/28/2012  . Cerebral embolism with cerebral infarction ([place]) 01/28/2012  . History of stroke 01/28/2012  . Type 2 diabetes mellitus without complication, without long-term current use of insulin ([place]) 01/28/2012  . Hyperlipidemia 01/28/2012  . History of prostate cancer 01/28/2012    Past Surgical History:  Procedure Laterality Date  . CARDIAC CATHETERIZATION    . FEMUR IM NAIL Right 11/17/2015   Procedure: INTRAMEDULLARY (IM) NAIL FEMORAL;  Surgeon: Marybelle Killings, MD;  Location: [facility];  Service:  Orthopedics;  Laterality: Right;  . PROSTATE SURGERY          Home Medications    Prior to Admission medications   Medication Sig Start Date End Date Taking? Authorizing Provider  amLODipine (NORVASC) 2.5 MG tablet Take 2.5 mg by mouth daily.  09/19/12   [provider]  atorvastatin (LIPITOR) 20 MG tablet Take 20 mg by mouth daily at 6 PM.     [provider]  cholecalciferol (VITAMIN D) 1000 UNITS tablet Take 1,000 Units by mouth 2 (two) times daily.     [provider]  clopidogrel (PLAVIX) 75 MG tablet Take 1 tablet (75 mg total) by mouth daily with breakfast. 03/20/15   Dennie Bible, NP  cyanocobalamin 1000 MCG tablet Take 1,000 mcg by mouth 2 (two) times daily.     [provider]  docusate sodium (COLACE) 100 MG capsule Take 1 capsule (100 mg total) by mouth 2 (two) times daily. Patient taking differently: Take 100 mg by mouth every 3 (three) days.  11/20/15   Hosie Poisson, MD  fish oil-omega-3 fatty acids 1000 MG capsule Take 1 g by mouth 2 (two) times daily.     [provider]  Flaxseed, Linseed, (FLAXSEED OIL) 1000 MG CAPS Take 1,000 mg by mouth 2 (two) times daily.     [provider]  glimepiride (AMARYL) 2 MG tablet Take 2 mg by mouth daily. 03/21/14   [provider]  ibuprofen (ADVIL,MOTRIN) 200 MG tablet Take 200 mg by mouth every 6 (six) hours  as needed for headache (pain).    [provider]  Lancets Glory Rosebush ULTRASOFT) lancets  07/07/12   [provider]  losartan (COZAAR) 50 MG tablet Take 50 mg by mouth at bedtime.     [provider]  mirtazapine (REMERON) 15 MG tablet 15 mg. 01/21/17   [provider]  ONE TOUCH ULTRA TEST test strip  09/06/12   [provider]  pantoprazole (PROTONIX) 40 MG tablet Take 1 tablet (40 mg total) by mouth daily. 02/12/12   Angiulli, Lavon Paganini, PA-C  [place] Wort 1000 MG CAPS Take 1,000 mg by mouth at bedtime.    [provider]    Family History Family History  Problem Relation Age of Onset  . Emphysema Father   . Heart Problems Mother     Social History Social History   Tobacco Use  . Smoking status: Former Smoker    Last attempt to quit: 05/04/1968    Years since quitting: 50.1  . Smokeless tobacco: Never Used  Substance Use Topics  . Alcohol use: No  . Drug use: No     Allergies   Sulfa drugs cross reactors   Review of Systems Review of Systems All other systems negative except as documented in the HPI. All pertinent positives and negatives as reviewed in the HPI.  Physical Exam Updated Vital Signs BP (!) 171/76 (BP Location: Right Arm)   Pulse 81   Temp 98.2 F (36.8 C) (Oral)   Resp 17   Ht 5\' 11"  (1.803 m)   Wt 74.8 kg   SpO2 99%   BMI 23.01 kg/m   Physical Exam Vitals signs and nursing note reviewed.  Constitutional:      General: He is not in acute distress.    Appearance: He is well-developed.  HENT:     Head: Normocephalic and atraumatic.  Eyes:     Pupils: Pupils are equal, round, and reactive to light.  Neck:     Musculoskeletal: Normal range of motion and neck supple.  Cardiovascular:     Rate and Rhythm: Normal rate and regular rhythm.     Heart sounds: Normal heart sounds. No murmur. No friction rub. No gallop.   Pulmonary:     Effort: Pulmonary effort is normal. No respiratory distress.     Breath sounds: Normal breath sounds. No wheezing.  Musculoskeletal:     Right hip: He exhibits tenderness and bony tenderness. He exhibits normal range of motion, normal strength, no swelling, no crepitus and no deformity.  Skin:    General: Skin is warm and dry.     Capillary Refill: Capillary refill takes less than 2 seconds.     Findings: No erythema or rash.  Neurological:     Mental Status: He is alert and oriented to person, place, and time.     Motor: No abnormal muscle tone.     Coordination: Coordination normal.  Psychiatric:        Behavior:  Behavior normal.      ED Treatments / Results  Labs (all labs ordered are listed, but only abnormal results are displayed) Labs Reviewed - No data to display  EKG None  Radiology Dg Hip Unilat W Or Wo Pelvis 2-3 Views Right  Result Date: 06/12/2018 CLINICAL DATA:  Patient status post fall.  Right leg pain. EXAM: DG HIP (WITH OR WITHOUT PELVIS) 2-3V RIGHT COMPARISON:  None. FINDINGS: Intramedullary rod within the right femur. No evidence for acute displaced femur fracture. Well  corticated ossific density adjacent to the lesser tuberosity. Regional soft tissues are unremarkable. Hardware appears intact. Pelvic surgical clips. Lumbar spine degenerative changes. SI joints unremarkable. Stool throughout the colon. IMPRESSION: No evidence for acute displaced fracture. Well corticated ossific density adjacent to the lesser trochanter, potentially sequelae of prior trauma. Recommend correlation for point tenderness to exclude the possibility of avulsion injury. Electronically Signed   By: Lovey Newcomer M.D.   On: 06/12/2018 14:24    Procedures Procedures (including critical care time)  Medications Ordered in ED Medications  acetaminophen (TYLENOL) tablet 1,000 mg (has no administration in time range)     Initial Impression / Assessment and Plan / ED Course  I have reviewed the triage vital signs and the nursing notes.  Pertinent labs & imaging results that were available during my care of the patient were reviewed by me and considered in my medical decision making (see chart for details).    Patient will need admission to the hospital due to the fact that he lives with his elderly wife and unable to ambulate at this time without significant discomfort.  I feel the patient will need admission due to the fact that he will need to be placed in a rehab and further pain control.  Final Clinical Impressions(s) / ED Diagnoses   Final diagnoses:  None    ED Discharge Orders    None         Dalia Heading, PA-C 06/12/18 1640    Carmin Muskrat, MD 06/12/18 2350

## 2018-06-13 DIAGNOSIS — E1122 Type 2 diabetes mellitus with diabetic chronic kidney disease: Secondary | ICD-10-CM | POA: Diagnosis not present

## 2018-06-13 DIAGNOSIS — E669 Obesity, unspecified: Secondary | ICD-10-CM | POA: Diagnosis not present

## 2018-06-13 DIAGNOSIS — M25551 Pain in right hip: Secondary | ICD-10-CM

## 2018-06-13 DIAGNOSIS — I10 Essential (primary) hypertension: Secondary | ICD-10-CM | POA: Diagnosis not present

## 2018-06-13 DIAGNOSIS — S32110A Nondisplaced Zone I fracture of sacrum, initial encounter for closed fracture: Secondary | ICD-10-CM | POA: Diagnosis not present

## 2018-06-13 DIAGNOSIS — D638 Anemia in other chronic diseases classified elsewhere: Secondary | ICD-10-CM | POA: Diagnosis not present

## 2018-06-13 DIAGNOSIS — S32591A Other specified fracture of right pubis, initial encounter for closed fracture: Secondary | ICD-10-CM | POA: Diagnosis not present

## 2018-06-13 DIAGNOSIS — D696 Thrombocytopenia, unspecified: Secondary | ICD-10-CM | POA: Diagnosis not present

## 2018-06-13 DIAGNOSIS — E785 Hyperlipidemia, unspecified: Secondary | ICD-10-CM | POA: Diagnosis not present

## 2018-06-13 DIAGNOSIS — E1169 Type 2 diabetes mellitus with other specified complication: Secondary | ICD-10-CM | POA: Diagnosis not present

## 2018-06-13 DIAGNOSIS — W19XXXA Unspecified fall, initial encounter: Secondary | ICD-10-CM | POA: Diagnosis not present

## 2018-06-13 DIAGNOSIS — S329XXA Fracture of unspecified parts of lumbosacral spine and pelvis, initial encounter for closed fracture: Secondary | ICD-10-CM

## 2018-06-13 DIAGNOSIS — N183 Chronic kidney disease, stage 3 (moderate): Secondary | ICD-10-CM | POA: Diagnosis not present

## 2018-06-13 DIAGNOSIS — Z8673 Personal history of transient ischemic attack (TIA), and cerebral infarction without residual deficits: Secondary | ICD-10-CM | POA: Diagnosis not present

## 2018-06-13 LAB — PROTIME-INR
INR: 1.17
Prothrombin Time: 14.8 seconds (ref 11.4–15.2)

## 2018-06-13 LAB — GLUCOSE, CAPILLARY
Glucose-Capillary: 113 mg/dL — ABNORMAL HIGH (ref 70–99)
Glucose-Capillary: 193 mg/dL — ABNORMAL HIGH (ref 70–99)
Glucose-Capillary: 126 mg/dL — ABNORMAL HIGH (ref 70–99)
Glucose-Capillary: 200 mg/dL — ABNORMAL HIGH (ref 70–99)

## 2018-06-13 LAB — CBC
HCT: 34.1 % — ABNORMAL LOW (ref 39.0–52.0)
Hemoglobin: 11.1 g/dL — ABNORMAL LOW (ref 13.0–17.0)
MCH: 30.8 pg (ref 26.0–34.0)
MCHC: 32.6 g/dL (ref 30.0–36.0)
MCV: 94.7 fL (ref 80.0–100.0)
Platelets: 141 10*3/uL — ABNORMAL LOW (ref 150–400)
RBC: 3.6 MIL/uL — ABNORMAL LOW (ref 4.22–5.81)
RDW: 12.9 % (ref 11.5–15.5)
WBC: 9.4 10*3/uL (ref 4.0–10.5)
nRBC: 0 % (ref 0.0–0.2)

## 2018-06-13 LAB — BASIC METABOLIC PANEL
Anion gap: 10 (ref 5–15)
BUN: 27 mg/dL — ABNORMAL HIGH (ref 8–23)
CO2: 21 mmol/L — ABNORMAL LOW (ref 22–32)
Calcium: 9 mg/dL (ref 8.9–10.3)
Chloride: 107 mmol/L (ref 98–111)
Creatinine, Ser: 1.55 mg/dL — ABNORMAL HIGH (ref 0.61–1.24)
GFR calc Af Amer: 46 mL/min — ABNORMAL LOW (ref >60)
GFR calc non Af Amer: 40 mL/min — ABNORMAL LOW (ref >60)
Glucose, Bld: 221 mg/dL — ABNORMAL HIGH (ref 70–99)
Potassium: 4.2 mmol/L (ref 3.5–5.1)
Sodium: 138 mmol/L (ref 135–145)

## 2018-06-13 LAB — APTT: aPTT: 34 seconds (ref 24–36)

## 2018-06-13 MED ORDER — SENNA 8.6 MG PO TABS: 2.0000 | ORAL_TABLET | Freq: Two times a day (BID) | ORAL | 0 refills | Status: DC

## 2018-06-13 MED ORDER — ADULT MULTIVITAMIN W/MINERALS CH
1.0000 | ORAL_TABLET | Freq: Every day | ORAL | Status: DC
  Administered 2018-06-13 – 2018-06-15 (×3): 1 via ORAL
  Filled 2018-06-13 (×3): qty 1

## 2018-06-13 MED ORDER — ACETAMINOPHEN 325 MG PO TABS
650.0000 mg | ORAL_TABLET | Freq: Four times a day (QID) | ORAL | Status: DC
  Administered 2018-06-14 – 2018-06-15 (×5): 650 mg via ORAL
  Filled 2018-06-13 (×6): qty 2

## 2018-06-13 MED ORDER — HYDROCODONE-ACETAMINOPHEN 5-325 MG PO TABS: 1.0000 | ORAL_TABLET | ORAL | 0 refills | Status: DC | PRN

## 2018-06-13 MED ORDER — OXYCODONE HCL 5 MG PO TABS: 5.0000 mg | ORAL_TABLET | Freq: Four times a day (QID) | ORAL | Status: DC | PRN

## 2018-06-13 NOTE — Evaluation (Signed)
Physical Therapy Evaluation Patient Details Name: Tyler [name] MRN: [id number] DOB: 13-Aug-1928 Today's Date: 06/13/2018   History of Present Illness  Tyler [name] is a 85 y.o. male with medical history significant of hypertension, hyperlipidemia, diabetes mellitus, stroke, GERD, CKD 3, prostate cancer, depression, who presents with a fall, right hip pain and right groin pain.. CT: Acute nondisplaced fractures of the right sacral ala, right superior pubic ramus, and right inferior pubic ramus  Clinical Impression  Pt a very healthy and active PTA, indep with quad cane and driving. Pt s/p fall sustaining non-displaced fractures of pelvis. Pt now requiring min/modA for all ADLs and mobility. Pt tolerated 12' amb with RW well despite 5/10 R hip pain. Pt very motivated and would greatly benefit from CIR upon d/c to achieve safe supervision level of function to return home with spouse. Acute PT to cont to follow.    Follow Up Recommendations CIR;Supervision/Assistance - 24 hour (will need SNF if denied CIR)    Equipment Recommendations  None recommended by PT(tBD)    Recommendations for Other Services Rehab consult     Precautions / Restrictions Precautions Precautions: Fall Precaution Comments: extremely HOH Restrictions Weight Bearing Restrictions: Yes RLE Weight Bearing: Weight bearing as tolerated LLE Weight Bearing: Weight bearing as tolerated      Mobility  Bed Mobility Overal bed mobility: Needs Assistance Bed Mobility: Supine to Sit     Supine to sit: Mod assist;HOB elevated     General bed mobility comments: increased time and cues  Transfers Overall transfer level: Needs assistance Equipment used: Rolling walker (2 wheeled) Transfers: Sit to/from Stand Sit to Stand: Mod assist;+2 safety/equipment         General transfer comment: verbal cues to push up from bed, modA to power up and steady during transition of hands, mild posterior lean, corrected with  v/c's  Ambulation/Gait Ambulation/Gait assistance: Mod assist;+2 safety/equipment Gait Distance (Feet): 12 Feet Assistive device: Rolling walker (2 wheeled) Gait Pattern/deviations: Step-to pattern;Decreased stride length;Decreased stance time - right;Antalgic Gait velocity: slow   General Gait Details: antalgic, limited R LE WBing tolerance due to pain, increased bilat UE support, verbal cues for stepping sequence, increased time, increased R Hip pain  Stairs            Wheelchair Mobility    Modified Rankin (Stroke Patients Only)       Balance Overall balance assessment: Needs assistance Sitting-balance support: No upper extremity supported;Feet supported Sitting balance-Leahy Scale: Fair   Postural control: Posterior lean Standing balance support: Bilateral upper extremity supported;During functional activity Standing balance-Leahy Scale: Poor Standing balance comment: dependent on RW                             Pertinent Vitals/Pain Pain Assessment: 0-10 Pain Score: 5  Pain Location: right leg with ambulation Pain Descriptors / Indicators: Aching;Sore Pain Intervention(s): Monitored during session    Home Living Family/patient expects to be discharged to:: Inpatient rehab Living Arrangements: Spouse/significant other Available Help at Discharge: Family;Available 24 hours/day Type of Home: House Home Access: Level entry     Home Layout: Two level Home Equipment: Shower seat - built in;Grab bars - tub/shower;Hand held shower head;Cane - quad(from prior chart)      Prior Function Level of Independence: Independent with assistive device(s)               Hand Dominance   Dominant Hand: Right    Extremity/Trunk Assessment  Upper Extremity Assessment Upper Extremity Assessment: Overall WFL for tasks assessed    Lower Extremity Assessment Lower Extremity Assessment: RLE deficits/detail;LLE deficits/detail RLE Deficits / Details: pt  initiating hip/knee flexion in supine, able to complete quad set, glut set and ankle pumps RLE Sensation: WNL LLE Deficits / Details: pt with full active hip/knee flexion in bed, able to complete quad and glut set as well as ankle pumps LLE Sensation: WNL    Cervical / Trunk Assessment Cervical / Trunk Assessment: Normal  Communication   Communication: HOH(hears better out of right ear)  Cognition Arousal/Alertness: Awake/alert Behavior During Therapy: WFL for tasks assessed/performed Overall Cognitive Status: Within Functional Limits for tasks assessed                                 General Comments: pt very motivated to get better and go home      General Comments General comments (skin integrity, edema, etc.): VSS    Exercises General Exercises - Lower Extremity Ankle Circles/Pumps: AROM;Both;Supine Quad Sets: AROM;Both;10 reps;Supine Gluteal Sets: AROM;Both;10 reps;Supine   Assessment/Plan    PT Assessment Patient needs continued PT services  PT Problem List Decreased strength;Decreased activity tolerance;Decreased balance;Decreased mobility;Decreased knowledge of use of DME;Pain       PT Treatment Interventions DME instruction;Stair training;Gait training;Functional mobility training;Therapeutic activities;Therapeutic exercise;Balance training    PT Goals (Current goals can be found in the Care Plan section)  Acute Rehab PT Goals Patient Stated Goal: home PT Goal Formulation: With patient Time For Goal Achievement: 06/27/18 Potential to Achieve Goals: Good    Frequency Min 5X/week   Barriers to discharge        Co-evaluation               AM-PAC PT "6 Clicks" Mobility  Outcome Measure Help needed turning from your back to your side while in a flat bed without using bedrails?: A Little Help needed moving from lying on your back to sitting on the side of a flat bed without using bedrails?: A Little Help needed moving to and from a bed to  a chair (including a wheelchair)?: A Little Help needed standing up from a chair using your arms (e.g., wheelchair or bedside chair)?: A Lot Help needed to walk in hospital room?: A Lot Help needed climbing 3-5 steps with a railing? : A Lot 6 Click Score: 15    End of Session Equipment Utilized During Treatment: Gait belt Activity Tolerance: Patient tolerated treatment well Patient left: in chair;with call bell/phone within reach;with chair alarm set Nurse Communication: Mobility status PT Visit Diagnosis: Pain;Difficulty in walking, not elsewhere classified (R26.2) Pain - Right/Left: Right Pain - part of body: Hip    Time: 8315-1761 PT Time Calculation (min) (ACUTE ONLY): 28 min   Charges:   PT Evaluation $PT Eval Moderate Complexity: 1 Mod          Kittie Plater, PT, DPT Acute Rehabilitation Services Pager #: [phone] Office #: [phone]   Koleen Distance Quinten Allerton 06/13/2018, 10:09 AM

## 2018-06-13 NOTE — Evaluation (Addendum)
Occupational Therapy Evaluation Patient Details Name: Tyler [name] MRN: [id number] DOB: 1929-01-12 Today's Date: 06/13/2018    History of Present Illness Tyler [name] is a 85 y.o. male with medical history significant of hypertension, hyperlipidemia, diabetes mellitus, stroke, GERD, CKD 3, prostate cancer, depression, who presents with a fall, right hip pain and right groin pain.. CT: Acute nondisplaced fractures of the right sacral ala, right superior pubic ramus, and right inferior pubic ramus   Clinical Impression   This 85 yo male admitted with above presents to acute OT with decreased mobility, decreased ambulation, increased pain all affecting his safety and independence with basic ADLs. Pt currently Mod A-setup and pta pt was independent with basic ADLs and active. He will benefit from acute OT with follow up on CIR to get to a Mod I level or better.     Follow Up Recommendations  CIR;Supervision/Assistance - 24 hour    Equipment Recommendations  3 in 1 bedside commode    Recommendations for Other Services Rehab consult     Precautions / Restrictions Precautions Precautions: Fall Precaution Comments: extremely HOH Restrictions Weight Bearing Restrictions: Yes RLE Weight Bearing: Weight bearing as tolerated LLE Weight Bearing: Weight bearing as tolerated      Mobility Bed Mobility Overal bed mobility: Needs Assistance Bed Mobility: Supine to Sit     Supine to sit: Mod assist;HOB elevated     General bed mobility comments: increased time and cues  Transfers Overall transfer level: Needs assistance Equipment used: Rolling walker (2 wheeled) Transfers: Sit to/from Stand Sit to Stand: Mod assist;+2 safety/equipment         General transfer comment: verbal cues to push up from bed, modA to power up and steady during transition of hands, mild posterior lean, corrected with v/c's    Balance Overall balance assessment: Needs assistance Sitting-balance support:  No upper extremity supported;Feet supported Sitting balance-Leahy Scale: Fair   Postural control: Posterior lean Standing balance support: Bilateral upper extremity supported;During functional activity Standing balance-Leahy Scale: Poor Standing balance comment: dependent on RW                           ADL either performed or assessed with clinical judgement   ADL Overall ADL's : Needs assistance/impaired Eating/Feeding: Independent;Sitting   Grooming: Set up;Sitting   Upper Body Bathing: Set up;Sitting   Lower Body Bathing: Moderate assistance Lower Body Bathing Details (indicate cue type and reason): Mod A sit<>stand and maintain due to tendency for posterior lean Upper Body Dressing : Set up;Sitting   Lower Body Dressing: Moderate assistance Lower Body Dressing Details (indicate cue type and reason): Mod A sit<>stand and maintain due to tendency for posterior lean Toilet Transfer: Moderate assistance;Ambulation;+2 for safety/equipment;RW Toilet Transfer Details (indicate cue type and reason): bed>around to recliner Toileting- Clothing Manipulation and Hygiene: Moderate assistance Toileting - Clothing Manipulation Details (indicate cue type and reason): Mod A sit<>stand and maintain due to tendency for posterior lean              Vision Patient Visual Report: No change from baseline              Pertinent Vitals/Pain Pain Assessment: 0-10 Pain Score: 5  Pain Location: right leg with ambulation Pain Descriptors / Indicators: Aching;Sore Pain Intervention(s): Monitored during session     Hand Dominance Right   Extremity/Trunk Assessment Upper Extremity Assessment Upper Extremity Assessment: Overall WFL for tasks assessed   Lower Extremity Assessment Lower  Extremity Assessment: RLE deficits/detail;LLE deficits/detail RLE Deficits / Details: pt initiating hip/knee flexion in supine, able to complete quad set, glut set and ankle pumps RLE Sensation:  WNL LLE Deficits / Details: pt with full active hip/knee flexion in bed, able to complete quad and glut set as well as ankle pumps LLE Sensation: WNL   Cervical / Trunk Assessment Cervical / Trunk Assessment: Normal   Communication Communication Communication: HOH(hears better out of right ear)   Cognition Arousal/Alertness: Awake/alert Behavior During Therapy: WFL for tasks assessed/performed Overall Cognitive Status: Within Functional Limits for tasks assessed                                 General Comments: pt very motivated to get better and go home   General Comments  VSS    Exercises Exercises: General Lower Extremity General Exercises - Lower Extremity Ankle Circles/Pumps: AROM;Both;Supine Quad Sets: AROM;Both;10 reps;Supine Gluteal Sets: AROM;Both;10 reps;Supine        Home Living Family/patient expects to be discharged to:: Inpatient rehab Living Arrangements: Spouse/significant other Available Help at Discharge: Family;Available 24 hours/day Type of Home: House Home Access: Level entry     Home Layout: Two level Alternate Level Stairs-Number of Steps: 3 Alternate Level Stairs-Rails: Right(from prior chart) Bathroom Shower/Tub: Occupational psychologist: Handicapped height     Home Equipment: Shower seat - built in;Grab bars - tub/shower;Hand held shower head;Cane - quad(from prior chart)          Prior Functioning/Environment Level of Independence: Independent with assistive device(s)                 OT Problem List: Decreased strength;Decreased range of motion;Impaired balance (sitting and/or standing);Pain      OT Treatment/Interventions: Self-care/ADL training;Balance training;DME and/or AE instruction;Patient/family education    OT Goals(Current goals can be found in the care plan section) Acute Rehab OT Goals Patient Stated Goal: to go to rehab then home OT Goal Formulation: With patient Time For Goal Achievement:  06/27/18 Potential to Achieve Goals: Good ADL Goals Pt Will Perform Grooming: with modified independence;standing Pt Will Perform Lower Body Bathing: with modified independence;sit to/from stand(with or without AE) Pt Will Perform Lower Body Dressing: with modified independence;sit to/from stand(with or without AE) Pt Will Transfer to Toilet: with modified independence;ambulating;bedside commode(over toilet) Pt Will Perform Toileting - Clothing Manipulation and hygiene: with modified independence;sit to/from stand Additional ADL Goal #1: pt will be Mod I in and OOB with HOB flat and no rail  OT Frequency: Min 2X/week              AM-PAC OT "6 Clicks" Daily Activity     Outcome Measure Help from another person eating meals?: None Help from another person taking care of personal grooming?: A Little Help from another person toileting, which includes using toliet, bedpan, or urinal?: A Lot Help from another person bathing (including washing, rinsing, drying)?: A Lot Help from another person to put on and taking off regular upper body clothing?: A Little Help from another person to put on and taking off regular lower body clothing?: A Lot 6 Click Score: 16   End of Session Equipment Utilized During Treatment: Gait belt;Rolling walker Nurse Communication: Mobility status(NT as well)  Activity Tolerance: Patient tolerated treatment well Patient left: in chair;with call bell/phone within reach;with chair alarm set  OT Visit Diagnosis: Unsteadiness on feet (R26.81);Other abnormalities of gait and mobility (R26.89);Pain  Pain - Right/Left: Right Pain - part of body: Leg                Time: 0911-0940 OT Time Calculation (min): 29 min Charges:  OT General Charges $OT Visit: 1 Visit OT Evaluation $OT Eval Moderate Complexity: [place], OTR/L Acute [employer] Pager [phone] Office [phone]     Almon Register 06/13/2018, 10:17 AM

## 2018-06-13 NOTE — Discharge Instructions (Signed)
[name], MD [facility] [place]  Please read the following information regarding your care after surgery.  Medications  You only need a prescription for the narcotic pain medicine (ex. oxycodone, Percocet, Norco).  All of the other medicines listed below are available over the counter. X hydrocodoen as prescribed for severe pain  Narcotic pain medicine (ex. oxycodone, Percocet, Vicodin) will cause constipation.  To prevent this problem, take the following medicines while you are taking any pain medicine. X docusate sodium (Colace) 100 mg twice a day X senna (Senokot) 2 tablets twice a day   Weight Bearing X Bear weight when you are able..   After your dressing, cast or splint is removed; you may shower, but do not soak or scrub the wound.  Allow the water to run over it, and then gently pat it dry.  Swelling It is normal for you to have swelling where you had surgery.  To reduce swelling and pain, keep your toes above your nose for at least 3 days after surgery.  It may be necessary to keep your foot or leg elevated for several weeks.  If it hurts, it should be elevated.  Follow Up Call my office at [phone] when you are discharged from the hospital or surgery center to schedule an appointment to be seen two weeks after surgery.  Call my office at [phone] if you develop a fever >101.5 F, nausea, vomiting, bleeding from the surgical site or severe pain.

## 2018-06-13 NOTE — Progress Notes (Signed)
Initial Nutrition Assessment  DOCUMENTATION CODES:   Not applicable  INTERVENTION:   -MVI with minerals daily -Magic Cup BID with meals, each supplement provides 290 kcals and 9 grams protein  NUTRITION DIAGNOSIS:   Increased nutrient needs related to acute illness as evidenced by estimated needs.  GOAL:   Patient will meet greater than or equal to 90% of their needs  MONITOR:   PO intake, Supplement acceptance, Labs, Weight trends, Skin, I & O's  REASON FOR ASSESSMENT:   Consult Assessment of nutrition requirement/status  ASSESSMENT:   Tyler [name] is a 85 y.o. male with medical history significant of hypertension, hyperlipidemia, diabetes mellitus, stroke, GERD, CKD 3, prostate cancer, depression, who presents with a fall, right hip pain and right groin pain.  Pt admitted with closed fracture of rt pubic ramus.   Per orthopedics notes, no plan for surgery at this time.   Spoke with pt and wife at bedside.Pt reports he has a great appetite and typically consumes 3 meals per day (noted pt with two plates on breakfast meal tray of which he consumed 100%). Typical intake includes: Breakfast: oatmeal, toast, scrambled eggs, bacon, tomatoes, and coffee; Lunch: sandwich; Dinner: salad and fried potatoes. Pt wife reveals that pt is a very selective eater and tends to choose foods such as subway sandwich, hamburgers, hot dogs, and pizza ("he will eat it if that's what he wants- I've learned that over the past 61 years").   Per pt wife, pt's UBW is around 175-180#. She reports pt has lost about 5# within the past 1.5 years as pt wife has been cooking and preparing meals more since she no longer works. Prior to this, pt would often consume fast food and convenience foods. Pt wife believes wt loss is related to pt consuming more healthier food choices. Nutrition ambassador obtained pt's lunch order. This RD also assisted pt with menu and meal selections.   Discussed with pt importance  of good meal and supplement intake to promote healing. Due to advanced age and potential for inconsistent intake, will order MVI and Magic Cup (pt enjoys sweets, specially ice creams and pudding).   Last Hgb A1c: 6.8 (06/29/16). PTA DM medications are 2 mg amaryl daily.   Labs reviewed: CBGS: 113-200 (inpatient orders for glycemic control are 0-5 units insulin aspart q HS, 0-9 units insulin aspart TID with meals).   NUTRITION - FOCUSED PHYSICAL EXAM:    Most Recent Value  Orbital Region  No depletion  Upper Arm Region  Mild depletion  Thoracic and Lumbar Region  No depletion  Buccal Region  No depletion  Temple Region  No depletion  Clavicle Bone Region  No depletion  Clavicle and Acromion Bone Region  No depletion  Scapular Bone Region  No depletion  Dorsal Hand  No depletion  Patellar Region  No depletion  Anterior Thigh Region  No depletion  Posterior Calf Region  Mild depletion  Edema (RD Assessment)  None  Hair  Reviewed  Eyes  Reviewed  Mouth  Reviewed  Skin  Reviewed  Nails  Reviewed       Diet Order:   Diet Order            Diet heart healthy/carb modified Room service appropriate? Yes; Fluid consistency: Thin  Diet effective now              EDUCATION NEEDS:   Education needs have been addressed  Skin:  Skin Assessment: Reviewed RN Assessment  Last BM:  06/10/18  Height:   Ht Readings from Last 1 Encounters:  06/12/18 5\' 11"  (1.803 m)    Weight:   Wt Readings from Last 1 Encounters:  06/12/18 74.8 kg    Ideal Body Weight:  78.2 kg  BMI:  Body mass index is 23.01 kg/m.  Estimated Nutritional Needs:   Kcal:  1700-1900  Protein:  90-105 grams  Fluid:  > 1.7 L    Charleen Madera A. Jimmye Norman, RD, LDN, CDE Pager: [phone] After hours Pager: [phone]

## 2018-06-13 NOTE — Progress Notes (Signed)
Rehab Admissions Coordinator Note:  Patient was screened by Retta Diones for appropriateness for an Inpatient Acute Rehab Consult.  At this time, we are recommending Inpatient Rehab consult.  Jodell Cipro M 06/13/2018, 11:25 AM  I can be reached at [phone].

## 2018-06-13 NOTE — Social Work (Signed)
CSW acknowledging consult for SNF placement. Will follow for therapy recommendations.   Emmanuelle Hibbitts, MSW, LCSWA [place] [facility] Social Work [phone]   

## 2018-06-13 NOTE — Progress Notes (Addendum)
Attending MD note  Patient was seen, examined,treatment plan was discussed with the PA-S.  I have personally reviewed the clinical findings, lab, imaging studies and management of this patient in detail. I agree with the documentation, as recorded by the PA-S  Tyler [name] is a 85 y.o. year old male with medical history significant for HTN, prior stroke on Plavix, type 2 diabetes, depression, hyperlipidemia who presented on 06/12/2018 with right hip pain after mechanical fall (walked out of bathroom with his 4 pronged cane and fell) and was found to have multiple nondisplaced fractures of the sacrum and pubic ramus.  Physical: Vitals:   06/13/18 0504 06/13/18 1434  BP: (!) 148/70 137/61  Pulse: 70 76  Resp: 18 18  Temp: (!) 97.1 F (36.2 C) 97.9 F (36.6 C)  SpO2: 97% 96%    Constitutional: Elderly male, sitting in bedside chair in no acute distress Eyes: EOMI, anicteric, normal conjunctivae, hard of hearing ENMT: Oropharynx with moist mucous membranes, normal dentition Cardiovascular: RRR no MRGs, with no peripheral edema Respiratory: Normal respiratory effort on room air, clear breath sounds  Abdomen: Soft,non-tender, normal bowel sounds Skin: No rash ulcers, or lesions. Without skin tenting  Neurologic: Grossly no focal neuro deficit. MSK: Decreased range of motion due to pain in right hip and right groin area Psychiatric:Appropriate affect, and mood. Mental status AAOx3  Plan  1. Acute nondisplaced fractures of the right sacral ala, right superior and inferior pubic ramus, nonoperative.  Evaluated by orthopedics no need for surgical intervention.  Evaluated by PT who recommends CIR, currently undergoing evaluation for that process.  We will continue pain control with scheduled Tylenol, Robaxin for muscle spasms PRN and oxycodone IR breakthrough/severe pain.  PRN bowel regimen.  Ortho recommends outpatient visit with Dr. Doran Durand in 2 to 3  weeks.  2. Mechanical fall.  Prior to fall patient healthy and active with use of cane and driving.  Now requiring minimum to moderate assist for all ADLs and mobility, PT and OT recommends CIR.  3. Stress leukocytosis, resolved.  Occurring in setting of a fall.  No localizing signs or symptoms of infection.  Patient remains afebrile.  4. History of stroke, stable.  Continue home Plavix and Lipitor  5. Anemia of chronic kidney disease.  Hemoglobin stable at baseline 10-12.  Close monitor on daily CBCs.  6. Hyperlipidemia, stable continue home Lipitor  7. Hypertension, stable.  Continue home amlodipine and losartan.  PRN IV hydralazine.  8. CKD, stage III.  Stable at baseline creatinine 1.4-1.5.  Avoid nephrotoxins follow BMP  9. Type 2 diabetes.  Holding home Amaryl, monitor CBG, sliding scale as needed  10. Depression, stable.  Continue home Zoloft.  Rest as below Desiree Hane [facility]    PROGRESS NOTE        PATIENT DETAILS Name: Tyler [name] Age: 85 y.o. Sex: male Date of Birth: [birth year] Admit Date: 06/12/2018 Admitting Physician Ivor Costa, MD WER:[id number], Thayer Jew, MD  Brief Narrative: Patient is a 85 y.o. male with a hx of stroke, HTN, CKD III, T2DM, that presents with a 1 day hx of multiple pelvic fractures preceded by a ground level mechanical fall.  According to the patient, the fall occurred 02/09 while he was at church. He uses a cane with 4 prongs and one of these prongs got snagged on something on the  floor, causing him to fall. He landed on his right hip and immediately experienced severe pain on that side. Pt denies experiencing both syncope  dizziness.  ED Course Vitals -> BP: 171/76 HR: 81 RR: 17 T: 36.8 O2: 99 @RA  ROS: + Right hip pain, - syncopy, -Fever, -Dizziness  Medications administered: Morphine, oxycodone Labs: WBC 12.9, Creatinine 1.44 Imaging: CT pelvis showed nondisplaced fractures of R sacral ala, superior pubic ramus, and inferior  pubic ramus.   Pt was admitted to hospital service for medical management with working diagnosis of multiple closed fractures of the pubic ramus.  Subjective: Today, the pt reports that his pain is well controlled. He has no specific complaints including:  No chest pain No SOB No headache No Nausea vomiting or diarrhea  Assessment/Plan: Principal Problem:   Closed fracture of pubic ramus, right, initial encounter [facility]): The orthopedic surgery team has seen him and they advise against surgery, as the surgery would not likely provide enough benefit to outweigh the risks.    Physical therapy has seen the pt and they report that he requires minimal help for all ADL's/mobility. They are recommending CIR placement upon discharge.  His pain is controlled with PRN morphine and oxycodone. We will continue to provide these as needed.    Active Problems:   History of stroke: Continue plavix and follow DVT prophylaxis (heparin injection)   Dyslipidemia: Continue atorvastatin.   Essential hypertension: Controlled. Continue losartan and amlodipine.   Type II diabetes mellitus with renal manifestations [facility]): Blood sugars controlled. Continue insulin.     Depression  DVT Prophylaxis: Heparin injection  Code Status: Full code   Family Communication: None  Disposition Plan: Remain inpatient-but will plan on Home health vs SNF on discharge  Antimicrobial agents: Anti-infectives (From admission, onward)   None      CONSULTS:  orthopedic surgery PT .  MEDICATIONS: Scheduled Meds: . amLODipine  2.5 mg Oral QHS  . atorvastatin  20 mg Oral Daily  . cholecalciferol  1,000 Units Oral QHS  . clopidogrel  75 mg Oral Q breakfast  . docusate sodium  100 mg Oral BID  . heparin  5,000 Units Subcutaneous Q8H  . insulin aspart  0-5 Units Subcutaneous QHS  . insulin aspart  0-9 Units Subcutaneous TID WC  . losartan  50 mg Oral Daily  . Melatonin  3 mg Oral QHS  . multivitamin with minerals   1 tablet Oral Daily  . sertraline  100 mg Oral Daily  . vitamin B-12  1,000 mcg Oral Daily   Continuous Infusions: PRN Meds:.hydrALAZINE, methocarbamol, morphine injection, oxyCODONE-acetaminophen, polyethylene glycol   PHYSICAL EXAM: Vital signs: Vitals:   06/12/18 1203 06/12/18 2004 06/12/18 2048 06/13/18 0504  BP: (!) 171/76 (!) 136/52 (!) 148/63 (!) 148/70  Pulse: 81 85 72 70  Resp: 17 18  18   Temp: 98.2 F (36.8 C)  97.7 F (36.5 C) (!) 97.1 F (36.2 C)  TempSrc: Oral  Oral Oral  SpO2: 99% 98% 98% 97%  Weight: 74.8 kg  74.8 kg   Height: 5\' 11"  (1.803 m)  5\' 11"  (1.803 m)    Filed Weights   06/12/18 1203 06/12/18 2048  Weight: 74.8 kg 74.8 kg   Body mass index is 23.01 kg/m.   General appearance :Awake, alert, not in any distress. Speech slurred due to prior CVA HEENT: Atraumatic and Normocephalic Resp:Good air entry bilaterally, no added sounds  CVS: S1 S2 regular, no murmurs.  GI: Bowel sounds present, Non tender and  not distended with no gaurding, rigidity or rebound.No organomegaly Extremities: B/L Lower Ext shows no edema, both legs are warm to touch Neurology:  speech clear,Non focal, sensation is grossly intact. Psychiatric: Normal judgment and insight. Alert and oriented x 3. Normal mood. Musculoskeletal: Tenderness around right pelvis Skin:No Rash, warm and dry  I have personally reviewed following labs and imaging studies  LABORATORY DATA: CBC: Recent Labs  Lab 06/12/18 1744 06/13/18 0224  WBC 12.9* 9.4  NEUTROABS 11.1*  --   HGB 11.9* 11.1*  HCT 35.8* 34.1*  MCV 96.5 94.7  PLT 154 141*    Basic Metabolic Panel: Recent Labs  Lab 06/12/18 1744 06/13/18 0224  NA 140 138  K 4.0 4.2  CL 111 107  CO2 20* 21*  GLUCOSE 79 221*  BUN 28* 27*  CREATININE 1.44* 1.55*  CALCIUM 9.3 9.0    GFR: Estimated Creatinine Clearance: 35.5 mL/min (A) (by C-G formula based on SCr of 1.55 mg/dL (H)).  Liver Function Tests: No results for input(s):  AST, ALT, ALKPHOS, BILITOT, PROT, ALBUMIN in the last 168 hours. No results for input(s): LIPASE, AMYLASE in the last 168 hours. No results for input(s): AMMONIA in the last 168 hours.  Coagulation Profile: Recent Labs  Lab 06/13/18 0224  INR 1.17    Cardiac Enzymes: No results for input(s): CKTOTAL, CKMB, CKMBINDEX, TROPONINI in the last 168 hours.  BNP (last 3 results) No results for input(s): PROBNP in the last 8760 hours.  HbA1C: No results for input(s): HGBA1C in the last 72 hours.  CBG: Recent Labs  Lab 06/12/18 2047 06/13/18 0806 06/13/18 1208  GLUCAP 200* 113* 193*    Lipid Profile: No results for input(s): CHOL, HDL, LDLCALC, TRIG, CHOLHDL, LDLDIRECT in the last 72 hours.  Thyroid Function Tests: No results for input(s): TSH, T4TOTAL, FREET4, T3FREE, THYROIDAB in the last 72 hours.  Anemia Panel: No results for input(s): VITAMINB12, FOLATE, FERRITIN, TIBC, IRON, RETICCTPCT in the last 72 hours.  Urine analysis:    Component Value Date/Time   COLORURINE YELLOW 06/12/2018 1902   APPEARANCEUR CLEAR 06/12/2018 1902   LABSPEC 1.014 06/12/2018 1902   PHURINE 5.0 06/12/2018 1902   GLUCOSEU NEGATIVE 06/12/2018 1902   HGBUR SMALL (A) 06/12/2018 1902   BILIRUBINUR NEGATIVE 06/12/2018 1902   KETONESUR 5 (A) 06/12/2018 1902   PROTEINUR NEGATIVE 06/12/2018 1902   UROBILINOGEN 1.0 03/13/2008 1130   NITRITE NEGATIVE 06/12/2018 1902   LEUKOCYTESUR NEGATIVE 06/12/2018 1902    Sepsis Labs: Lactic Acid, Venous No results found for: LATICACIDVEN  MICROBIOLOGY: No results found for this or any previous visit (from the past 240 hour(s)).  RADIOLOGY STUDIES/RESULTS: Ct Hip Right Wo Contrast  Result Date: 06/12/2018 CLINICAL DATA:  Right hip pain after fall at church. EXAM: CT OF THE RIGHT HIP WITHOUT CONTRAST TECHNIQUE: Multidetector CT imaging of the right hip was performed according to the standard protocol. Multiplanar CT image reconstructions were also generated.  COMPARISON:  Radiographs from 06/12/2018 FINDINGS: Bones/Joint/Cartilage Nondisplaced fracture of the right sacral ala, image 34/11. Nondisplaced fracture of the right superior pubic ramus, image 75/11. Nondisplaced fracture of the right inferior pubic ramus, image 100/11. Right hip screw with IM rod noted. No femur fracture is observed in the visualized part of the femur, we included down to the distal metaphysis but not quite all the way through the stem of the implant. There is a single distal interlocking screw noted. Spurring along the lesser trochanter. Degenerative loss of intervertebral disc height at L4-5. Ligaments Suboptimally  assessed by CT. Muscles and Tendons Unremarkable Soft tissues Aortoiliac atherosclerotic vascular disease. Prior lymph node dissection in the right hemipelvis. Suspected prostatectomy. IMPRESSION: 1. Acute nondisplaced fractures of the right sacral ala, right superior pubic ramus, and right inferior pubic ramus. 2. Degenerative loss of intervertebral disc height at L4-5. 3.  Aortic Atherosclerosis (ICD10-I70.0). Electronically Signed   By: Van Clines M.D.   On: 06/12/2018 16:24   Dg Hip Unilat W Or Wo Pelvis 2-3 Views Right  Result Date: 06/12/2018 CLINICAL DATA:  Patient status post fall.  Right leg pain. EXAM: DG HIP (WITH OR WITHOUT PELVIS) 2-3V RIGHT COMPARISON:  None. FINDINGS: Intramedullary rod within the right femur. No evidence for acute displaced femur fracture. Well corticated ossific density adjacent to the lesser tuberosity. Regional soft tissues are unremarkable. Hardware appears intact. Pelvic surgical clips. Lumbar spine degenerative changes. SI joints unremarkable. Stool throughout the colon. IMPRESSION: No evidence for acute displaced fracture. Well corticated ossific density adjacent to the lesser trochanter, potentially sequelae of prior trauma. Recommend correlation for point tenderness to exclude the possibility of avulsion injury. Electronically  Signed   By: Lovey Newcomer M.D.   On: 06/12/2018 14:24     LOS: 0 days   Lauro Franklin, PA-S  [facility]  If 7PM-7AM, please contact night-coverage  Please page via [email or web address]  Go to [email or web address] and use [facility]'s universal password to access. If you do not have the password, please contact the hospital operator.  Locate the [facility] provider you are looking for under [facility] and page to a number that you can be directly reached. If you still have difficulty reaching the provider, please page the [facility] (Director on Call) for the Hospitalists listed on amion for assistance.  06/13/2018, 12:32 PM

## 2018-06-13 NOTE — Consult Note (Signed)
Physical Medicine and Rehabilitation Consult   Reason for Consult:  Functional deficits due to sacral fracture Referring Physician: Dr. Lonny Prude   HPI: Tyler [name] is a 85 y.o. male with history of T2DM, prostate cancer, HTN, CVA/TIA; who was admitted on 06/12/18 with fall and onset of right hip/groin pain. History taken from chart review and wife.  CT pelvis revealed nondisplaced fracture of right sacral ala and superior and inferior pubic rami. Dr. Doran Durand consulted for input and recommended  WBAT and follow up on outpatient basis. Therapy evaluations completed today revealing significant deficits in mobility and selfcare. CIR recommended due to functional decline. Ambulated NT to BR but noted to be impulsive with poor safety awareness.  Hospital course complicated by pain, thrombocytopenia.  Patient required assistance with all ADLs at baseline.  Wife able to provide supervision at discharge.  Review of Systems  Constitutional: Negative for chills and fever.  HENT: Positive for hearing loss.   Eyes: Negative for blurred vision and double vision.  Respiratory: Negative for cough and shortness of breath.   Cardiovascular: Negative for chest pain and palpitations.  Gastrointestinal: Negative for abdominal pain, heartburn and nausea.  Genitourinary: Positive for frequency.  Musculoskeletal: Positive for joint pain and myalgias.  Skin: Negative for rash.  Neurological: Positive for dizziness (this am due to narcotics/) and focal weakness (RLE due to prior stroke/fracture).  All other systems reviewed and are negative.  Past Medical History:  Diagnosis Date  . Cancer [facility])    Prostate  . Diabetes mellitus without complication ([place])   . Hypertension   . Kidney stones   . Stroke [facility])     Past Surgical History:  Procedure Laterality Date  . CARDIAC CATHETERIZATION    . FEMUR IM NAIL Right 11/17/2015   Procedure: INTRAMEDULLARY (IM) NAIL FEMORAL;  Surgeon: Marybelle Killings, MD;   Location: [place];  Service: Orthopedics;  Laterality: Right;  . PROSTATE SURGERY      Family History  Problem Relation Age of Onset  . Emphysema Father   . Heart Problems Mother     Social History:  Married. Lives with wife. Independent with [employer] PTA. He reports that he quit smoking about 50 years ago. He has never used smokeless tobacco. He reports that he drinks a glass of wine couple of times a year. He does not use drugs.    Allergies  Allergen Reactions  . Sulfa Drugs Cross Reactors Anaphylaxis and Rash    Lungs fill with fluid   Medications Prior to Admission  Medication Sig Dispense Refill  . amLODipine (NORVASC) 2.5 MG tablet Take 2.5 mg by mouth at bedtime.     [name] atorvastatin (LIPITOR) 20 MG tablet Take 20 mg by mouth daily.     . cholecalciferol (VITAMIN D) 1000 UNITS tablet Take 1,000 Units by mouth at bedtime.     . clopidogrel (PLAVIX) 75 MG tablet Take 1 tablet (75 mg total) by mouth daily with breakfast. 30 tablet 6  . Flaxseed, Linseed, (FLAXSEED OIL) 1000 MG CAPS Take 1,000 mg by mouth at bedtime.     [name] glimepiride (AMARYL) 2 MG tablet Take 2 mg by mouth daily.    [name] losartan (COZAAR) 50 MG tablet Take 50 mg by mouth daily.     [name] MELATONIN PO Take 1 tablet by mouth at bedtime.    . sertraline (ZOLOFT) 50 MG tablet Take 100 mg by mouth daily.    . vitamin B-12 (CYANOCOBALAMIN) 1000 MCG tablet  Take 1,000 mcg by mouth daily.    [name] docusate sodium (COLACE) 100 MG capsule Take 1 capsule (100 mg total) by mouth 2 (two) times daily. (Patient not taking: Reported on 06/12/2018) 10 capsule 0  . Lancets (ONETOUCH ULTRASOFT) lancets     . ONE TOUCH ULTRA TEST test strip     . pantoprazole (PROTONIX) 40 MG tablet Take 1 tablet (40 mg total) by mouth daily. (Patient not taking: Reported on 06/12/2018) 30 tablet 1    Home: [place] expects to be discharged to:: Inpatient rehab Living Arrangements: Spouse/significant other Available Help at Discharge: Family,  Available 24 hours/day Type of Home: House Home Access: Level entry Home Layout: Two level Alternate Level Stairs-Number of Steps: 3 Alternate Level Stairs-Rails: Right(from prior chart) Bathroom Shower/Tub: Multimedia programmer: Handicapped height Home Equipment: Civil engineer, contracting - built in, [employer] - tub/shower, Engineer, manufacturing held shower head, Cane - quad(from prior chart)  Functional History: Prior Function Level of Independence: Independent with assistive device(s) Functional Status:  Mobility: Bed Mobility Overal bed mobility: Needs Assistance Bed Mobility: Supine to Sit Supine to sit: Mod assist, HOB elevated General bed mobility comments: increased time and cues Transfers Overall transfer level: Needs assistance Equipment used: Rolling walker (2 wheeled) Transfers: Sit to/from Stand Sit to Stand: Mod assist, +2 safety/equipment General transfer comment: verbal cues to push up from bed, modA to power up and steady during transition of hands, mild posterior lean, corrected with v/c's Ambulation/Gait Ambulation/Gait assistance: Mod assist, +2 safety/equipment Gait Distance (Feet): 12 Feet Assistive device: Rolling walker (2 wheeled) Gait Pattern/deviations: Step-to pattern, Decreased stride length, Decreased stance time - right, Antalgic General Gait Details: antalgic, limited R LE WBing tolerance due to pain, increased bilat UE support, verbal cues for stepping sequence, increased time, increased R Hip pain Gait velocity: slow    ADL: ADL Overall ADL's : Needs assistance/impaired Eating/Feeding: Independent, Sitting Grooming: Set up, Sitting Upper Body Bathing: Set up, Sitting Lower Body Bathing: Moderate assistance Lower Body Bathing Details (indicate cue type and reason): Mod A sit<>stand and maintain due to tendency for posterior lean Upper Body Dressing : Set up, Sitting Lower Body Dressing: Moderate assistance Lower Body Dressing Details (indicate cue type and  reason): Mod A sit<>stand and maintain due to tendency for posterior lean Toilet Transfer: Moderate assistance, Ambulation, +2 for safety/equipment, RW Toilet Transfer Details (indicate cue type and reason): bed>around to recliner Toileting- Clothing Manipulation and Hygiene: Moderate assistance Toileting - Clothing Manipulation Details (indicate cue type and reason): Mod A sit<>stand and maintain due to tendency for posterior lean   Cognition: Cognition Overall Cognitive Status: Within Functional Limits for tasks assessed Cognition Arousal/Alertness: Awake/alert Behavior During Therapy: WFL for tasks assessed/performed Overall Cognitive Status: Within Functional Limits for tasks assessed General Comments: pt very motivated to get better and go home  Blood pressure (!) 148/70, pulse 70, temperature (!) 97.1 F (36.2 C), temperature source Oral, resp. rate 18, height 5\' 11"  (1.803 m), weight 74.8 kg, SpO2 97 %. Physical Exam  Nursing note and vitals reviewed. Constitutional: He is oriented to person, place, and time. He appears well-developed and well-nourished.  HENT:  Head: Normocephalic and atraumatic.  Eyes: EOM are normal. Right eye exhibits no discharge. Left eye exhibits no discharge.  Neck: Normal range of motion. Neck supple.  Cardiovascular: Normal rate and regular rhythm.  Respiratory: Effort normal and breath sounds normal.  GI: Soft. Bowel sounds are normal.  Musculoskeletal:     Comments: Right hip tenderness  Neurological: He is alert and oriented to person, place, and time.  Right facial weakness.  Speech clear.  Able to follow basic command without difficulty.  Very HOH Motor: Bilateral upper extremities: 4+-5/5 proximal distal Left lower extremity: Hip flexion 4/5, knee extension 4-4+/5, ankle dorsiflexion 5/5 Right lower extremity: Hip flexion 3/5, knee extension 4/5, ankle dorsiflexion 5/5  Skin: Skin is warm and dry.  Scattered hematomas  Psychiatric: He has  a normal mood and affect. His behavior is normal.    Results for orders placed or performed during the hospital encounter of 06/12/18 (from the past 24 hour(s))  Basic metabolic panel     Status: Abnormal   Collection Time: 06/12/18  5:44 PM  Result Value Ref Range   Sodium 140 135 - 145 mmol/L   Potassium 4.0 3.5 - 5.1 mmol/L   Chloride 111 98 - 111 mmol/L   CO2 20 (L) 22 - 32 mmol/L   Glucose, Bld 79 70 - 99 mg/dL   BUN 28 (H) 8 - 23 mg/dL   Creatinine, Ser 1.44 (H) 0.61 - 1.24 mg/dL   Calcium 9.3 8.9 - 10.3 mg/dL   GFR calc non Af Amer 43 (L) >60 mL/min   GFR calc Af Amer 50 (L) >60 mL/min   Anion gap 9 5 - 15  CBC with Differential     Status: Abnormal   Collection Time: 06/12/18  5:44 PM  Result Value Ref Range   WBC 12.9 (H) 4.0 - 10.5 K/uL   RBC 3.71 (L) 4.22 - 5.81 MIL/uL   Hemoglobin 11.9 (L) 13.0 - 17.0 g/dL   HCT 35.8 (L) 39.0 - 52.0 %   MCV 96.5 80.0 - 100.0 fL   MCH 32.1 26.0 - 34.0 pg   MCHC 33.2 30.0 - 36.0 g/dL   RDW 12.9 11.5 - 15.5 %   Platelets 154 150 - 400 K/uL   nRBC 0.0 0.0 - 0.2 %   Neutrophils Relative % 86 %   Neutro Abs 11.1 (H) 1.7 - 7.7 K/uL   Lymphocytes Relative 7 %   Lymphs Abs 0.9 0.7 - 4.0 K/uL   Monocytes Relative 6 %   Monocytes Absolute 0.8 0.1 - 1.0 K/uL   Eosinophils Relative 0 %   Eosinophils Absolute 0.0 0.0 - 0.5 K/uL   Basophils Relative 0 %   Basophils Absolute 0.0 0.0 - 0.1 K/uL   Immature Granulocytes 1 %   Abs Immature Granulocytes 0.09 (H) 0.00 - 0.07 K/uL  Urinalysis, Routine w reflex microscopic     Status: Abnormal   Collection Time: 06/12/18  7:02 PM  Result Value Ref Range   Color, Urine YELLOW YELLOW   APPearance CLEAR CLEAR   Specific Gravity, Urine 1.014 1.005 - 1.030   pH 5.0 5.0 - 8.0   Glucose, UA NEGATIVE NEGATIVE mg/dL   Hgb urine dipstick SMALL (A) NEGATIVE   Bilirubin Urine NEGATIVE NEGATIVE   Ketones, ur 5 (A) NEGATIVE mg/dL   Protein, ur NEGATIVE NEGATIVE mg/dL   Nitrite NEGATIVE NEGATIVE    Leukocytes, UA NEGATIVE NEGATIVE   RBC / HPF 6-10 0 - 5 RBC/hpf   WBC, UA 0-5 0 - 5 WBC/hpf   Bacteria, UA RARE (A) NONE SEEN   Mucus PRESENT   Glucose, capillary     Status: Abnormal   Collection Time: 06/12/18  8:47 PM  Result Value Ref Range   Glucose-Capillary 200 (H) 70 - 99 mg/dL  CBC     Status: Abnormal   Collection  Time: 06/13/18  2:24 AM  Result Value Ref Range   WBC 9.4 4.0 - 10.5 K/uL   RBC 3.60 (L) 4.22 - 5.81 MIL/uL   Hemoglobin 11.1 (L) 13.0 - 17.0 g/dL   HCT 34.1 (L) 39.0 - 52.0 %   MCV 94.7 80.0 - 100.0 fL   MCH 30.8 26.0 - 34.0 pg   MCHC 32.6 30.0 - 36.0 g/dL   RDW 12.9 11.5 - 15.5 %   Platelets 141 (L) 150 - 400 K/uL   nRBC 0.0 0.0 - 0.2 %  Basic metabolic panel     Status: Abnormal   Collection Time: 06/13/18  2:24 AM  Result Value Ref Range   Sodium 138 135 - 145 mmol/L   Potassium 4.2 3.5 - 5.1 mmol/L   Chloride 107 98 - 111 mmol/L   CO2 21 (L) 22 - 32 mmol/L   Glucose, Bld 221 (H) 70 - 99 mg/dL   BUN 27 (H) 8 - 23 mg/dL   Creatinine, Ser 1.55 (H) 0.61 - 1.24 mg/dL   Calcium 9.0 8.9 - 10.3 mg/dL   GFR calc non Af Amer 40 (L) >60 mL/min   GFR calc Af Amer 46 (L) >60 mL/min   Anion gap 10 5 - 15  Protime-INR     Status: None   Collection Time: 06/13/18  2:24 AM  Result Value Ref Range   Prothrombin Time 14.8 11.4 - 15.2 seconds   INR 1.17   APTT     Status: None   Collection Time: 06/13/18  2:24 AM  Result Value Ref Range   aPTT 34 24 - 36 seconds  Glucose, capillary     Status: Abnormal   Collection Time: 06/13/18  8:06 AM  Result Value Ref Range   Glucose-Capillary 113 (H) 70 - 99 mg/dL   Comment 1 Notify RN    Comment 2 Document in Chart    Ct Hip Right Wo Contrast  Result Date: 06/12/2018 CLINICAL DATA:  Right hip pain after [place] at [place]. EXAM: CT OF THE RIGHT HIP WITHOUT CONTRAST TECHNIQUE: Multidetector CT imaging of the right hip was performed according to the standard protocol. Multiplanar CT image reconstructions were also generated.  COMPARISON:  Radiographs from 06/12/2018 FINDINGS: Bones/Joint/Cartilage Nondisplaced fracture of the right sacral ala, image 34/11. Nondisplaced fracture of the right superior pubic ramus, image 75/11. Nondisplaced fracture of the right inferior pubic ramus, image 100/11. Right hip screw with IM rod noted. No femur fracture is observed in the visualized part of the femur, we included down to the distal metaphysis but not quite all the way through the stem of the implant. There is a single distal interlocking screw noted. Spurring along the lesser trochanter. Degenerative loss of intervertebral disc height at L4-5. Ligaments Suboptimally assessed by CT. Muscles and Tendons Unremarkable Soft tissues Aortoiliac atherosclerotic vascular disease. Prior lymph node dissection in the right hemipelvis. Suspected prostatectomy. IMPRESSION: 1. Acute nondisplaced fractures of the right sacral ala, right superior pubic ramus, and right inferior pubic ramus. 2. Degenerative loss of intervertebral disc height at L4-5. 3.  Aortic Atherosclerosis (ICD10-I70.0). Electronically Signed   By: Van Clines M.D.   On: 06/12/2018 16:24   Dg Hip Unilat W Or Wo Pelvis 2-3 Views Right  Result Date: 06/12/2018 CLINICAL DATA:  Patient status post fall.  Right leg pain. EXAM: DG HIP (WITH OR WITHOUT PELVIS) 2-3V RIGHT COMPARISON:  None. FINDINGS: Intramedullary rod within the right femur. No evidence for acute displaced femur fracture. Well corticated  ossific density adjacent to the lesser tuberosity. Regional soft tissues are unremarkable. Hardware appears intact. Pelvic surgical clips. Lumbar spine degenerative changes. SI joints unremarkable. Stool throughout the colon. IMPRESSION: No evidence for acute displaced fracture. Well corticated ossific density adjacent to the lesser trochanter, potentially sequelae of prior trauma. Recommend correlation for point tenderness to exclude the possibility of avulsion injury. Electronically  Signed   By: Lovey Newcomer M.D.   On: 06/12/2018 14:24    Assessment/Plan: Diagnosis: Pelvic fracture Labs independently reviewed.  Records reviewed and summated above.  1. Does the need for close, 24 hr/day medical supervision in concert with the patient's rehab needs make it unreasonable for this patient to be served in a less intensive setting? Yes  2. Co-Morbidities requiring supervision/potential complications: pain (Biofeedback training with therapies to help reduce reliance on opiate pain medications, monitor pain control during therapies, and sedation at rest and titrate to maximum efficacy to ensure participation and gains in therapies), thrombocytopenia, T2 DM (Monitor in accordance with exercise and adjust meds as necessary), prostate cancer, HTN (monitor and provide prns in accordance with increased physical exertion and pain), CVA/TIA, anemia of chronic disease (repeat labs, transfuse to ensure appropriate perfusion for increased activity tolerance), CKD (avoid nephrotoxic meds) 3. Due to bladder management, safety, skin/wound care, disease management, medication administration, pain management and patient education, does the patient require 24 hr/day rehab nursing? Yes 4. Does the patient require coordinated care of a physician, rehab nurse, PT (1-2 hrs/day, 5 days/week) and OT (1-2 hrs/day, 5 days/week) to address physical and functional deficits in the context of the above medical diagnosis(es)? Yes Addressing deficits in the following areas: balance, endurance, locomotion, strength, transferring, bowel/bladder control, bathing, dressing, toileting and psychosocial support 5. Can the patient actively participate in an intensive therapy program of at least 3 hrs of therapy per day at least 5 days per week? Potentially 6. The potential for patient to make measurable gains while on inpatient rehab is excellent 7. Anticipated functional outcomes upon discharge from inpatient rehab are  supervision and min assist  with PT, supervision and min assist with OT, n/a with SLP. 8. Estimated rehab length of stay to reach the above functional goals is: 17-21 days. 9. Anticipated D/C setting: Home 10. Anticipated post D/C treatments: HH therapy and Home excercise program 11. Overall Rehab/Functional Prognosis: good  RECOMMENDATIONS: This patient's condition is appropriate for continued rehabilitative care in the following setting: CIR if wife able to provide necessary assistance at discharge. Patient has agreed to participate in recommended program. Yes Note that insurance prior authorization may be required for reimbursement for recommended care.  Comment: Rehab Admissions Coordinator to follow up.   I have personally performed a face to face diagnostic evaluation, including, but not limited to relevant history and physical exam findings, of this patient and developed relevant assessment and plan.  Additionally, I have reviewed and concur with the physician assistant's documentation above.   Delice Lesch, MD, ABPMR Bary Leriche, PA-C 06/13/2018

## 2018-06-13 NOTE — Plan of Care (Signed)
  Problem: Coping: Goal: Level of anxiety will decrease Outcome: Progressing   Problem: Elimination: Goal: Will not experience complications related to urinary retention Outcome: Progressing   Problem: Pain Managment: Goal: General experience of comfort will improve Outcome: Progressing   

## 2018-06-13 NOTE — Consult Note (Signed)
Reason for Consult: Pubic rami fx Referring Physician: Dr. Ivor Costa   HPI: Patient is an 85 yo Tyler [name], with PMH significant for DM, stroke, stage 3 CKD, and prostate CA, who presented to the [facility] ED via EMS on 06/12/18 with R groin pain after sustaining a fall at church.  He reports that he was using his 4 prong can when he fell.  He described a sharp throbbing sensation.  Attempted WB and movement of R hip exacerbated his symptoms.  NWB and immobilization helped to alleviate his symptoms.  Radiographs and CT were obtained of the patient's Right hip and pelvis.  This demonstrated nondisplaced fx of right sacral ala, superior and inferior pubic rami.  The patient was admitted to the hospital under Medicine service.  Dr. [name] was then consulted.  The patient was made NPO in case of potential need for surgery.  He denies any previous injury to his groin.  He is a former smoker.  Past Medical History:  Diagnosis Date  . Cancer [facility])    Prostate  . Diabetes mellitus without complication ([place])   . Hypertension   . Kidney stones   . Stroke [facility])     Past Surgical History:  Procedure Laterality Date  . CARDIAC CATHETERIZATION    . FEMUR IM NAIL Right 11/17/2015   Procedure: INTRAMEDULLARY (IM) NAIL FEMORAL;  Surgeon: Marybelle Killings, MD;  Location: [place];  Service: Orthopedics;  Laterality: Right;  . PROSTATE SURGERY      Family History  Problem Relation Age of Onset  . Emphysema Father   . Heart Problems Mother     Social History:  reports that he quit smoking about 50 years ago. He has never used smokeless tobacco. He reports that he does not drink alcohol or use drugs.  Allergies:  Allergies  Allergen Reactions  . Sulfa Drugs Cross Reactors Anaphylaxis and Rash    Lungs fill with fluid    Medications: I have reviewed the patient's current medications.  Results for orders placed or performed during the hospital encounter of 06/12/18 (from the past 48 hour(s))  Basic  metabolic panel     Status: Abnormal   Collection Time: 06/12/18  5:44 PM  Result Value Ref Range   Sodium 140 135 - 145 mmol/L   Potassium 4.0 3.5 - 5.1 mmol/L   Chloride 111 98 - 111 mmol/L   CO2 20 (L) 22 - 32 mmol/L   Glucose, Bld 79 70 - 99 mg/dL   BUN 28 (H) 8 - 23 mg/dL   Creatinine, Ser 1.44 (H) 0.61 - 1.24 mg/dL   Calcium 9.3 8.9 - 10.3 mg/dL   GFR calc non Af Amer 43 (L) >60 mL/min   GFR calc Af Amer 50 (L) >60 mL/min   Anion gap 9 5 - 15    Comment: Performed at [facility], [address], [place], [place] [address]  CBC with Differential     Status: Abnormal   Collection Time: 06/12/18  5:44 PM  Result Value Ref Range   WBC 12.9 (H) 4.0 - 10.5 K/uL   RBC 3.71 (L) 4.22 - 5.81 MIL/uL   Hemoglobin 11.9 (L) 13.0 - 17.0 g/dL   HCT 35.8 (L) 39.0 - 52.0 %   MCV 96.5 80.0 - 100.0 fL   MCH 32.1 26.0 - 34.0 pg   MCHC 33.2 30.0 - 36.0 g/dL   RDW 12.9 11.5 - 15.5 %   Platelets 154 150 - 400 K/uL  nRBC 0.0 0.0 - 0.2 %   Neutrophils Relative % 86 %   Neutro Abs 11.1 (H) 1.7 - 7.7 K/uL   Lymphocytes Relative 7 %   Lymphs Abs 0.9 0.7 - 4.0 K/uL   Monocytes Relative 6 %   Monocytes Absolute 0.8 0.1 - 1.0 K/uL   Eosinophils Relative 0 %   Eosinophils Absolute 0.0 0.0 - 0.5 K/uL   Basophils Relative 0 %   Basophils Absolute 0.0 0.0 - 0.1 K/uL   Immature Granulocytes 1 %   Abs Immature Granulocytes 0.09 (H) 0.00 - 0.07 K/uL    Comment: Performed at [facility] [address] [place], [place] [address]  Urinalysis, Routine w reflex microscopic     Status: Abnormal   Collection Time: 06/12/18  7:02 PM  Result Value Ref Range   Color, Urine YELLOW YELLOW   APPearance CLEAR CLEAR   Specific Gravity, Urine 1.014 1.005 - 1.030   pH 5.0 5.0 - 8.0   Glucose, UA NEGATIVE NEGATIVE mg/dL   Hgb urine dipstick SMALL (A) NEGATIVE   Bilirubin Urine NEGATIVE NEGATIVE   Ketones, ur 5 (A) NEGATIVE mg/dL   Protein, ur NEGATIVE NEGATIVE mg/dL   Nitrite NEGATIVE NEGATIVE    Leukocytes, UA NEGATIVE NEGATIVE   RBC / HPF 6-10 0 - 5 RBC/hpf   WBC, UA 0-5 0 - 5 WBC/hpf   Bacteria, UA RARE (A) NONE SEEN   Mucus PRESENT     Comment: Performed at [facility] [address], [place], [place] [address]  Glucose, capillary     Status: Abnormal   Collection Time: 06/12/18  8:47 PM  Result Value Ref Range   Glucose-Capillary 200 (H) 70 - 99 mg/dL  CBC     Status: Abnormal   Collection Time: 06/13/18  2:24 AM  Result Value Ref Range   WBC 9.4 4.0 - 10.5 K/uL   RBC 3.60 (L) 4.22 - 5.81 MIL/uL   Hemoglobin 11.1 (L) 13.0 - 17.0 g/dL   HCT 34.1 (L) 39.0 - 52.0 %   MCV 94.7 80.0 - 100.0 fL   MCH 30.8 26.0 - 34.0 pg   MCHC 32.6 30.0 - 36.0 g/dL   RDW 12.9 11.5 - 15.5 %   Platelets 141 (L) 150 - 400 K/uL   nRBC 0.0 0.0 - 0.2 %    Comment: Performed at [facility], [place] [address], [place], [place] [address]  Basic metabolic panel     Status: Abnormal   Collection Time: 06/13/18  2:24 AM  Result Value Ref Range   Sodium 138 135 - 145 mmol/L   Potassium 4.2 3.5 - 5.1 mmol/L   Chloride 107 98 - 111 mmol/L   CO2 21 (L) 22 - 32 mmol/L   Glucose, Bld 221 (H) 70 - 99 mg/dL   BUN 27 (H) 8 - 23 mg/dL   Creatinine, Ser 1.55 (H) 0.61 - 1.24 mg/dL   Calcium 9.0 8.9 - 10.3 mg/dL   GFR calc non Af Amer 40 (L) >60 mL/min   GFR calc Af Amer 46 (L) >60 mL/min   Anion gap 10 5 - 15    Comment: Performed at [facility] [address], [place], [place] [address]  Protime-INR     Status: None   Collection Time: 06/13/18  2:24 AM  Result Value Ref Range   Prothrombin Time 14.8 11.4 - 15.2 seconds   INR 1.17     Comment: Performed at [facility], [place] [address], [place], [place] [address]  APTT     Status: None   Collection Time: 06/13/18  2:24 AM  Result Value Ref Range   aPTT 34 24 - 36 seconds    Comment: Performed at [facility] [address], [place], [place] [address]    Ct Hip Right Wo Contrast  Result Date: 06/12/2018 CLINICAL  DATA:  Right hip pain after fall at church. EXAM: CT OF THE RIGHT HIP WITHOUT CONTRAST TECHNIQUE: Multidetector CT imaging of the right hip was performed according to the standard protocol. Multiplanar CT image reconstructions were also generated. COMPARISON:  Radiographs from 06/12/2018 FINDINGS: Bones/Joint/Cartilage Nondisplaced fracture of the right sacral ala, image 34/11. Nondisplaced fracture of the right superior pubic ramus, image 75/11. Nondisplaced fracture of the right inferior pubic ramus, image 100/11. Right hip screw with IM rod noted. No femur fracture is observed in the visualized part of the femur, we included down to the distal metaphysis but not quite all the way through the stem of the implant. There is a single distal interlocking screw noted. Spurring along the lesser trochanter. Degenerative loss of intervertebral disc height at L4-5. Ligaments Suboptimally assessed by CT. Muscles and Tendons Unremarkable Soft tissues Aortoiliac atherosclerotic vascular disease. Prior lymph node dissection in the right hemipelvis. Suspected prostatectomy. IMPRESSION: 1. Acute nondisplaced fractures of the right sacral ala, right superior pubic ramus, and right inferior pubic ramus. 2. Degenerative loss of intervertebral disc height at L4-5. 3.  Aortic Atherosclerosis (ICD10-I70.0). Electronically Signed   By: Van Clines M.D.   On: 06/12/2018 16:24   Dg Hip Unilat W Or Wo Pelvis 2-3 Views Right  Result Date: 06/12/2018 CLINICAL DATA:  Patient status post fall.  Right leg pain. EXAM: DG HIP (WITH OR WITHOUT PELVIS) 2-3V RIGHT COMPARISON:  None. FINDINGS: Intramedullary rod within the right femur. No evidence for acute displaced femur fracture. Well corticated ossific density adjacent to the lesser tuberosity. Regional soft tissues are unremarkable. Hardware appears intact. Pelvic surgical clips. Lumbar spine degenerative changes. SI joints unremarkable. Stool throughout the colon. IMPRESSION: No  evidence for acute displaced fracture. Well corticated ossific density adjacent to the lesser trochanter, potentially sequelae of prior trauma. Recommend correlation for point tenderness to exclude the possibility of avulsion injury. Electronically Signed   By: Lovey Newcomer M.D.   On: 06/12/2018 14:24    ROS:   Gen: Denies fever, chills, weight change, fatigue, night sweats MSK: C/o of R groin pain Neuro: Denies headache, numbness, weakness, slurred speech, loss of memory or consciousness Derm: Denies rash, dry skin, scaling or peeling skin change HEENT: Denies blurred vision, double vision, neck stiffness, dysphagia PULM: Denies shortness of breath, cough, sputum production, hemoptysis, wheezing CV: Denies chest pain, edema, orthopnea, palpitations GI: Denies abdominal pain, nausea, vomiting, diarrhea, hematochezia, melena, constipation  Endocrine: Denies hot or cold intolerance, polyuria, polyphagia or appetite change Heme: Denies easy bruising, bleeding, bleeding gums  PE:  General: WDWN patient in NAD. Psych:  Appropriate mood and affect. Neuro:  A&O x 3, Moving all extremities, sensation intact to light touch HEENT:  EOMs intact Chest:  Even non-labored respirations Skin:  Incision C/D/I, no rashes or lesions Extremities: warm/dry, no edema, erythema or echymosis.  No lymphadenopathy. Pulses: Popliteus 2+ MSK:  Nontender R hip. ROM: R HF to 45 degrees, MMT: able to perform quad set, (-) Homan's  Blood pressure (!) 148/70, pulse 70, temperature (!) 97.1 F (36.2 C), temperature source Oral, resp. rate  18, height 5\' 11"  (1.803 m), weight 74.8 kg, SpO2 97 %.   Assessment/Plan: Nondisplaced R sacral ala, superior and inferior pubic rami  This is a stable injury that can be treated in closed fashion. No surgery indicated at this time, NPO status removed. WBAT  Up with therapy Rx for hydrocodone sent to patient's pharmacy. Plan for outpatient visit with Dr. Doran Durand in 2-3  weeks.  Mechele Claude PA-C [facility] Office:  [phone]

## 2018-06-13 NOTE — Progress Notes (Addendum)
2030 Received patient from ED, A&O x4. complains of pain 4/10 only with activity.  Will continue to monitor.   Called ortho tech for trapeze as ordered. Elderly patients older than 48 generally are not given a trapeze due to lack of strength.  Will come assess patient tomorrow.

## 2018-06-13 NOTE — Consult Note (Signed)
[facility] Nurse wound consult note Reason for Consult:two skin tears at right arm, one at elbow and one at forearm Wound type:trauma Pressure Injury POA: NA Measurement: elbow:  2cm x 1.8cm x 0.1cm skin tear with red wound bed, serosanguinous exudate in a small amount  Forearm: 2cm x 1.4cm x 0.1cm with pink, moist wound bed, scant serous exudate and bruising in the periwound area Wound bed:see above Drainage (amount, consistency, odor) see above Periwound:see above Dressing procedure/placement/frequency: I will discontinue the silicone foam dressings in favor of white petrolatum gauze dressing (Vaseline) performed once daily until reepithelialized.  [facility] nursing team will not follow, but will remain available to this patient, the nursing and medical teams.  Please re-consult if needed. Thanks, Maudie Flakes, MSN, RN, [place], Arther Abbott  Pager# [phone]

## 2018-06-14 DIAGNOSIS — E1169 Type 2 diabetes mellitus with other specified complication: Secondary | ICD-10-CM | POA: Diagnosis not present

## 2018-06-14 DIAGNOSIS — W19XXXA Unspecified fall, initial encounter: Secondary | ICD-10-CM | POA: Diagnosis not present

## 2018-06-14 DIAGNOSIS — D638 Anemia in other chronic diseases classified elsewhere: Secondary | ICD-10-CM | POA: Diagnosis not present

## 2018-06-14 DIAGNOSIS — I10 Essential (primary) hypertension: Secondary | ICD-10-CM | POA: Diagnosis not present

## 2018-06-14 DIAGNOSIS — N183 Chronic kidney disease, stage 3 (moderate): Secondary | ICD-10-CM | POA: Diagnosis not present

## 2018-06-14 DIAGNOSIS — E1122 Type 2 diabetes mellitus with diabetic chronic kidney disease: Secondary | ICD-10-CM | POA: Diagnosis not present

## 2018-06-14 DIAGNOSIS — M25551 Pain in right hip: Secondary | ICD-10-CM | POA: Diagnosis not present

## 2018-06-14 DIAGNOSIS — S329XXA Fracture of unspecified parts of lumbosacral spine and pelvis, initial encounter for closed fracture: Secondary | ICD-10-CM | POA: Diagnosis not present

## 2018-06-14 DIAGNOSIS — Z8673 Personal history of transient ischemic attack (TIA), and cerebral infarction without residual deficits: Secondary | ICD-10-CM | POA: Diagnosis not present

## 2018-06-14 DIAGNOSIS — E785 Hyperlipidemia, unspecified: Secondary | ICD-10-CM | POA: Diagnosis not present

## 2018-06-14 DIAGNOSIS — E669 Obesity, unspecified: Secondary | ICD-10-CM | POA: Diagnosis not present

## 2018-06-14 LAB — CBC
HCT: 32.9 % — ABNORMAL LOW (ref 39.0–52.0)
Hemoglobin: 11.1 g/dL — ABNORMAL LOW (ref 13.0–17.0)
MCH: 32.1 pg (ref 26.0–34.0)
MCHC: 33.7 g/dL (ref 30.0–36.0)
MCV: 95.1 fL (ref 80.0–100.0)
NRBC: 0 % (ref 0.0–0.2)
Platelets: 146 10*3/uL — ABNORMAL LOW (ref 150–400)
RBC: 3.46 MIL/uL — ABNORMAL LOW (ref 4.22–5.81)
RDW: 13 % (ref 11.5–15.5)
WBC: 9.9 10*3/uL (ref 4.0–10.5)

## 2018-06-14 LAB — BASIC METABOLIC PANEL
Anion gap: 9 (ref 5–15)
BUN: 30 mg/dL — ABNORMAL HIGH (ref 8–23)
CO2: 21 mmol/L — ABNORMAL LOW (ref 22–32)
Calcium: 9 mg/dL (ref 8.9–10.3)
Chloride: 109 mmol/L (ref 98–111)
Creatinine, Ser: 1.65 mg/dL — ABNORMAL HIGH (ref 0.61–1.24)
GFR calc non Af Amer: 37 mL/min — ABNORMAL LOW (ref >60)
GFR, EST AFRICAN AMERICAN: 43 mL/min — AB (ref >60)
Glucose, Bld: 143 mg/dL — ABNORMAL HIGH (ref 70–99)
POTASSIUM: 4.2 mmol/L (ref 3.5–5.1)
Sodium: 139 mmol/L (ref 135–145)

## 2018-06-14 LAB — GLUCOSE, CAPILLARY
Glucose-Capillary: 123 mg/dL — ABNORMAL HIGH (ref 70–99)
Glucose-Capillary: 155 mg/dL — ABNORMAL HIGH (ref 70–99)
Glucose-Capillary: 94 mg/dL (ref 70–99)
Glucose-Capillary: 181 mg/dL — ABNORMAL HIGH (ref 70–99)

## 2018-06-14 MED ORDER — TRAMADOL HCL 50 MG PO TABS
50.0000 mg | ORAL_TABLET | Freq: Four times a day (QID) | ORAL | Status: DC | PRN
  Administered 2018-06-15: 50 mg via ORAL
  Filled 2018-06-14: qty 1

## 2018-06-14 NOTE — Progress Notes (Signed)
Attending MD note  Patient was seen, examined,treatment plan was discussed with the PA-S.  I have personally reviewed the clinical findings, lab, imaging studies and management of this patient in detail. I agree with the documentation, as recorded by the PA-S  Tyler [name] is a 85 y.o. year old male with medical history significant for HTN, prior stroke on Plavix, type 2 diabetes, depression, hyperlipidemia who presented on 06/12/2018 with right hip pain after mechanical fall (walked out of bathroom with his 4 pronged cane and fell) and was found to have multiple nondisplaced fractures of the sacrum and pubic ramus.  Physical: Vitals:   06/13/18 2206 06/14/18 0539  BP: (!) 129/52 (!) 144/69  Pulse: 76 73  Resp: 18 18  Temp: 97.6 F (36.4 C) 98.8 F (37.1 C)  SpO2: 94% 95%    Constitutional: Elderly male, sitting in bedside chair in no acute distress Eyes: EOMI, anicteric, normal conjunctivae, hard of hearing ENMT: Oropharynx with moist mucous membranes, normal dentition Cardiovascular: RRR no MRGs, with no peripheral edema Respiratory: Normal respiratory effort on room air, clear breath sounds  Abdomen: Soft,non-tender, normal bowel sounds Skin: No rash ulcers, or lesions. Without skin tenting  Neurologic: Grossly no focal neuro deficit. MSK: Decreased range of motion due to pain in right hip and right groin area Psychiatric:Appropriate affect, and mood. Mental status AAOx3  Plan  1. Acute nondisplaced fractures of the right sacral ala, right superior and inferior pubic ramus, nonoperative.  Evaluated by orthopedics no need for surgical intervention.  Evaluated by PT who recommends CIR, currently undergoing evaluation for that process.  We will continue pain control with scheduled Tylenol, Robaxin for muscle spasms, wife request discontinuation of oxycodone will initiate tramadol for as needed breakthrough/severe pain.  PRN bowel regimen.  Ortho recommends  outpatient visit with Dr. Doran Durand in 2 to 3 weeks.  2. Mechanical fall.  Prior to fall patient healthy and active with use of cane and driving.  Now requiring minimum to moderate assist for all ADLs and mobility, PT and OT recommends CIR.  3. Stress leukocytosis, resolved.  Occurring in setting of a fall.  No localizing signs or symptoms of infection.  Patient remains afebrile.  4. History of stroke, stable.  Continue home Plavix and Lipitor  5. Anemia of chronic kidney disease.  Hemoglobin stable at baseline 10-12.  Close monitor on daily CBCs.  6. Hyperlipidemia, stable continue home Lipitor  7. Hypertension, stable.  Continue home amlodipine and losartan.  PRN IV hydralazine.  8. CKD, stage III.  Stable at baseline creatinine 1.4-1.5.  Avoid nephrotoxins follow BMP  9. Type 2 diabetes.  Holding home Amaryl, monitor CBG, sliding scale as needed  10. Depression, stable.  Continue home Zoloft.  Rest as below Desiree Hane [facility]    PROGRESS NOTE        PATIENT DETAILS Name: Tyler [name] Age: 85 y.o. Sex: male Date of Birth: 1929/01/14 Admit Date: 06/12/2018 Admitting Physician Ivor Costa, MD FUX:NATFT, Thayer Jew, MD  Brief Narrative: Patient is a 85 y.o. male with a hx of stroke, HTN, CKD III, T2DM, that presents with a 1 day hx of multiple pelvic fractures preceded by a ground level mechanical fall.  According to the patient, the fall occurred 02/09 while he was at church. He uses a cane with 4 prongs and one of these  prongs got snagged on something on the floor, causing him to fall. He landed on his right hip and immediately experienced severe pain on that side. Pt denies experiencing both syncope  dizziness.  ED Course Vitals -> BP: 171/76 HR: 81 RR: 17 T: 36.8 O2: 99 @RA  ROS: + Right hip pain, - syncopy, -Fever, -Dizziness  Medications administered: Morphine, oxycodone Labs: WBC 12.9, Creatinine 1.44 Imaging: CT pelvis showed nondisplaced fractures of R  sacral ala, superior pubic ramus, and inferior pubic ramus.   Pt was admitted to hospital service for medical management with working diagnosis of multiple closed fractures of the pubic ramus.  Subjective: Today, the pt reports that his pain is well controlled. He has no specific complaints including:  No chest pain No SOB No headache No Nausea vomiting or diarrhea  Assessment/Plan: Principal Problem:   Closed fracture of pubic ramus, right, initial encounter [facility]): The orthopedic surgery team has seen him and they advise against surgery, as the surgery would not likely provide enough benefit to outweigh the risks.    Physical therapy has seen the pt and they report that he requires minimal help for all ADL's/mobility. They are recommending CIR placement upon discharge.  His pain is controlled with PRN morphine and oxycodone. We will continue to provide these as needed.    Active Problems:   History of stroke: Continue plavix and follow DVT prophylaxis (heparin injection)   Dyslipidemia: Continue atorvastatin.   Essential hypertension: Controlled. Continue losartan and amlodipine.   Type II diabetes mellitus with renal manifestations [facility]): Blood sugars controlled. Continue insulin.     Depression  DVT Prophylaxis: Heparin injection  Code Status: Full code   Family Communication: None  Disposition Plan: Remain inpatient-but will plan on Home health vs SNF on discharge  Antimicrobial agents: Anti-infectives (From admission, onward)   None      CONSULTS:  orthopedic surgery PT .  MEDICATIONS: Scheduled Meds: . acetaminophen  650 mg Oral Q6H  . amLODipine  2.5 mg Oral QHS  . atorvastatin  20 mg Oral Daily  . cholecalciferol  1,000 Units Oral QHS  . clopidogrel  75 mg Oral Q breakfast  . docusate sodium  100 mg Oral BID  . heparin  5,000 Units Subcutaneous Q8H  . insulin aspart  0-5 Units Subcutaneous QHS  . insulin aspart  0-9 Units Subcutaneous TID WC  .  losartan  50 mg Oral Daily  . Melatonin  3 mg Oral QHS  . multivitamin with minerals  1 tablet Oral Daily  . sertraline  100 mg Oral Daily  . vitamin B-12  1,000 mcg Oral Daily   Continuous Infusions: PRN Meds:.hydrALAZINE, methocarbamol, morphine injection, polyethylene glycol, traMADol   PHYSICAL EXAM: Vital signs: Vitals:   06/13/18 0504 06/13/18 1434 06/13/18 2206 06/14/18 0539  BP: (!) 148/70 137/61 (!) 129/52 (!) 144/69  Pulse: 70 76 76 73  Resp: 18 18 18 18   Temp: (!) 97.1 F (36.2 C) 97.9 F (36.6 C) 97.6 F (36.4 C) 98.8 F (37.1 C)  TempSrc: Oral Oral Oral Oral  SpO2: 97% 96% 94% 95%  Weight:      Height:       Filed Weights   06/12/18 1203 06/12/18 2048  Weight: 74.8 kg 74.8 kg   Body mass index is 23.01 kg/m.   General appearance :Awake, alert, not in any distress. Speech slurred due to prior CVA HEENT: Atraumatic and Normocephalic Resp:Good air entry bilaterally, no added sounds  CVS: S1 S2 regular, no  murmurs.  GI: Bowel sounds present, Non tender and not distended with no gaurding, rigidity or rebound.No organomegaly Extremities: B/L Lower Ext shows no edema, both legs are warm to touch Neurology:  speech clear,Non focal, sensation is grossly intact. Psychiatric: Normal judgment and insight. Alert and oriented x 3. Normal mood. Musculoskeletal: Tenderness around right pelvis Skin:No Rash, warm and dry  I have personally reviewed following labs and imaging studies  LABORATORY DATA: CBC: Recent Labs  Lab 06/12/18 1744 06/13/18 0224 06/14/18 0350  WBC 12.9* 9.4 9.9  NEUTROABS 11.1*  --   --   HGB 11.9* 11.1* 11.1*  HCT 35.8* 34.1* 32.9*  MCV 96.5 94.7 95.1  PLT 154 141* 146*    Basic Metabolic Panel: Recent Labs  Lab 06/12/18 1744 06/13/18 0224 06/14/18 0350  NA 140 138 139  K 4.0 4.2 4.2  CL 111 107 109  CO2 20* 21* 21*  GLUCOSE 79 221* 143*  BUN 28* 27* 30*  CREATININE 1.44* 1.55* 1.65*  CALCIUM 9.3 9.0 9.0    GFR: Estimated  Creatinine Clearance: 33.4 mL/min (A) (by C-G formula based on SCr of 1.65 mg/dL (H)).  Liver Function Tests: No results for input(s): AST, ALT, ALKPHOS, BILITOT, PROT, ALBUMIN in the last 168 hours. No results for input(s): LIPASE, AMYLASE in the last 168 hours. No results for input(s): AMMONIA in the last 168 hours.  Coagulation Profile: Recent Labs  Lab 06/13/18 0224  INR 1.17    Cardiac Enzymes: No results for input(s): CKTOTAL, CKMB, CKMBINDEX, TROPONINI in the last 168 hours.  BNP (last 3 results) No results for input(s): PROBNP in the last 8760 hours.  HbA1C: No results for input(s): HGBA1C in the last 72 hours.  CBG: Recent Labs  Lab 06/13/18 1208 06/13/18 1703 06/13/18 2208 06/14/18 0815 06/14/18 1230  GLUCAP 193* 126* 200* 123* 155*    Lipid Profile: No results for input(s): CHOL, HDL, LDLCALC, TRIG, CHOLHDL, LDLDIRECT in the last 72 hours.  Thyroid Function Tests: No results for input(s): TSH, T4TOTAL, FREET4, T3FREE, THYROIDAB in the last 72 hours.  Anemia Panel: No results for input(s): VITAMINB12, FOLATE, FERRITIN, TIBC, IRON, RETICCTPCT in the last 72 hours.  Urine analysis:    Component Value Date/Time   COLORURINE YELLOW 06/12/2018 1902   APPEARANCEUR CLEAR 06/12/2018 1902   LABSPEC 1.014 06/12/2018 1902   PHURINE 5.0 06/12/2018 1902   GLUCOSEU NEGATIVE 06/12/2018 1902   HGBUR SMALL (A) 06/12/2018 1902   BILIRUBINUR NEGATIVE 06/12/2018 1902   KETONESUR 5 (A) 06/12/2018 1902   PROTEINUR NEGATIVE 06/12/2018 1902   UROBILINOGEN 1.0 03/13/2008 1130   NITRITE NEGATIVE 06/12/2018 1902   LEUKOCYTESUR NEGATIVE 06/12/2018 1902    Sepsis Labs: Lactic Acid, Venous No results found for: LATICACIDVEN  MICROBIOLOGY: No results found for this or any previous visit (from the past 240 hour(s)).  RADIOLOGY STUDIES/RESULTS: Ct Hip Right Wo Contrast  Result Date: 06/12/2018 CLINICAL DATA:  Right hip pain after fall at church. EXAM: CT OF THE RIGHT HIP  WITHOUT CONTRAST TECHNIQUE: Multidetector CT imaging of the right hip was performed according to the standard protocol. Multiplanar CT image reconstructions were also generated. COMPARISON:  Radiographs from 06/12/2018 FINDINGS: Bones/Joint/Cartilage Nondisplaced fracture of the right sacral ala, image 34/11. Nondisplaced fracture of the right superior pubic ramus, image 75/11. Nondisplaced fracture of the right inferior pubic ramus, image 100/11. Right hip screw with IM rod noted. No femur fracture is observed in the visualized part of the femur, we included down to the distal metaphysis  but not quite all the way through the stem of the implant. There is a single distal interlocking screw noted. Spurring along the lesser trochanter. Degenerative loss of intervertebral disc height at L4-5. Ligaments Suboptimally assessed by CT. Muscles and Tendons Unremarkable Soft tissues Aortoiliac atherosclerotic vascular disease. Prior lymph node dissection in the right hemipelvis. Suspected prostatectomy. IMPRESSION: 1. Acute nondisplaced fractures of the right sacral ala, right superior pubic ramus, and right inferior pubic ramus. 2. Degenerative loss of intervertebral disc height at L4-5. 3.  Aortic Atherosclerosis (ICD10-I70.0). Electronically Signed   By: Van Clines M.D.   On: 06/12/2018 16:24   Dg Hip Unilat W Or Wo Pelvis 2-3 Views Right  Result Date: 06/12/2018 CLINICAL DATA:  Patient status post fall.  Right leg pain. EXAM: DG HIP (WITH OR WITHOUT PELVIS) 2-3V RIGHT COMPARISON:  None. FINDINGS: Intramedullary rod within the right femur. No evidence for acute displaced femur fracture. Well corticated ossific density adjacent to the lesser tuberosity. Regional soft tissues are unremarkable. Hardware appears intact. Pelvic surgical clips. Lumbar spine degenerative changes. SI joints unremarkable. Stool throughout the colon. IMPRESSION: No evidence for acute displaced fracture. Well corticated ossific density  adjacent to the lesser trochanter, potentially sequelae of prior trauma. Recommend correlation for point tenderness to exclude the possibility of avulsion injury. Electronically Signed   By: Lovey Newcomer M.D.   On: 06/12/2018 14:24     LOS: 0 days   Desiree Hane, PA-S  [facility]  If 7PM-7AM, please contact night-coverage  Please page via [email or web address]  Go to [email or web address] and use [facility]'s universal password to access. If you do not have the password, please contact the hospital operator.  Locate the [facility] provider you are looking for under [facility] and page to a number that you can be directly reached. If you still have difficulty reaching the provider, please page the [facility] (Director on Call) for the Hospitalists listed on amion for assistance.  06/14/2018, 5:18 PM

## 2018-06-14 NOTE — Progress Notes (Signed)
Physical Therapy Treatment Patient Details Name: Tyler [name] MRN: [id number] DOB: 1928-08-16 Today's Date: 06/14/2018    History of Present Illness Tyler [name] is a 85 y.o. male with medical history significant of hypertension, hyperlipidemia, diabetes mellitus, stroke, GERD, CKD 3, prostate cancer, depression, who presents with a fall, right hip pain and right groin pain.. CT: Acute nondisplaced fractures of the right sacral ala, right superior pubic ramus, and right inferior pubic ramus    PT Comments    Pt reports being up in chair for 4 hours and his back is hurting and he would like to get back to bed. Pt is limited in safe mobility by increased pain especially in R LE with weight bearing. Pt requires modAx2 for transfers, short ambulation and return to bed. Pt appreciative to be back in bed and reports pain is less in supine. D/c plans remain appropriate and pt looks forward to working hard in [place] before returning home. PT will continue to follow acutely until transfer.     Follow Up Recommendations  CIR;Supervision/Assistance - 24 hour     Equipment Recommendations  None recommended by PT(tBD)    Recommendations for Other Services Rehab consult     Precautions / Restrictions Precautions Precautions: Fall Precaution Comments: extremely HOH Restrictions Weight Bearing Restrictions: Yes RLE Weight Bearing: Weight bearing as tolerated LLE Weight Bearing: Weight bearing as tolerated    Mobility  Bed Mobility Overal bed mobility: Needs Assistance Bed Mobility: Sit to Supine       Sit to supine: Mod assist;+2 for physical assistance   General bed mobility comments: modAx2 for bring trunk to bed and LE into bed, vc for keeping LE together and not trying to get on e leg at a time back into bed  Transfers Overall transfer level: Needs assistance Equipment used: Rolling walker (2 wheeled) Transfers: Sit to/from Stand Sit to Stand: Mod assist;+2 physical assistance          General transfer comment: modAx2 for powerup and steadying with RW, pt with decreased weightbearing through R LE and requires increased effort to come to standing  Ambulation/Gait Ambulation/Gait assistance: Mod assist;+2 safety/equipment Gait Distance (Feet): 3 Feet Assistive device: Rolling walker (2 wheeled) Gait Pattern/deviations: Step-to pattern;Decreased stride length;Decreased stance time - right;Antalgic Gait velocity: slow Gait velocity interpretation: <1.31 ft/sec, indicative of household ambulator General Gait Details: antalgic, limited R LE WBing tolerance due to pain, increased bilat UE support, verbal cues for stepping sequence, increased time, increased R Hip pain       Balance Overall balance assessment: Needs assistance Sitting-balance support: Feet supported Sitting balance-Leahy Scale: Fair     Standing balance support: Bilateral upper extremity supported Standing balance-Leahy Scale: Poor Standing balance comment: dependent on RW                            Cognition Arousal/Alertness: Awake/alert Behavior During Therapy: WFL for tasks assessed/performed Overall Cognitive Status: Within Functional Limits for tasks assessed                                 General Comments: pt very motivated to get better and go home         General Comments General comments (skin integrity, edema, etc.): Pt wife present during session, VSS      Pertinent Vitals/Pain Pain Assessment: Faces Faces Pain Scale: Hurts even more Pain Location: right  leg with ambulation Pain Descriptors / Indicators: Aching;Sore Pain Intervention(s): Limited activity within patient's tolerance;Monitored during session;Repositioned           PT Goals (current goals can now be found in the care plan section) Acute Rehab PT Goals Patient Stated Goal: home PT Goal Formulation: With patient Time For Goal Achievement: 06/27/18 Potential to Achieve Goals:  Good Progress towards PT goals: Progressing toward goals    Frequency    Min 5X/week      PT Plan Current plan remains appropriate       AM-PAC PT "6 Clicks" Mobility   Outcome Measure  Help needed turning from your back to your side while in a flat bed without using bedrails?: A Little Help needed moving from lying on your back to sitting on the side of a flat bed without using bedrails?: A Little Help needed moving to and from a bed to a chair (including a wheelchair)?: A Little Help needed standing up from a chair using your arms (e.g., wheelchair or bedside chair)?: A Lot Help needed to walk in hospital room?: A Lot Help needed climbing 3-5 steps with a railing? : A Lot 6 Click Score: 15    End of Session Equipment Utilized During Treatment: Gait belt Activity Tolerance: Patient tolerated treatment well Patient left: in chair;with call bell/phone within reach;with chair alarm set Nurse Communication: Mobility status PT Visit Diagnosis: Pain;Difficulty in walking, not elsewhere classified (R26.2) Pain - Right/Left: Right Pain - part of body: Hip     Time: 1455-1510 PT Time Calculation (min) (ACUTE ONLY): 15 min  Charges:  $Gait Training: 8-22 mins                     Maisa Bedingfield B. Migdalia Dk PT, DPT Acute Rehabilitation Services Pager [phone] Office [phone]    [place] 06/14/2018, 3:57 PM

## 2018-06-14 NOTE — Progress Notes (Signed)
Subjective:     Patient reports pain as mild to moderate.  Resting comfortably in bed.  States that he worked well with therapy yesterday. PT recommends CIR. Tolerating POs well.  Admits to flatus.  Denies fever, chills, N/V, CP, SOB.  Objective:   VITALS:  Temp:  [97.6 F (36.4 C)-98.8 F (37.1 C)] 98.8 F (37.1 C) (02/11 0539) Pulse Rate:  [73-76] 73 (02/11 0539) Resp:  [18] 18 (02/11 0539) BP: (129-144)/(52-69) 144/69 (02/11 0539) SpO2:  [94 %-96 %] 95 % (02/11 0539)  General: WDWN patient in NAD. Psych:  Appropriate mood and affect. Neuro:  A&O x 3, Moving all extremities, sensation intact to light touch HEENT:  EOMs intact Chest:  Even non-labored respirations Skin: C/D/I, no rashes or lesions Extremities: Nontender R hip.  warm/dry, no edema, erythema or echymosis.  No lymphadenopathy. Pulses: Popliteus 2+ MSK:  ROM: TKE, MMT: able to perform quad set, (-) Homan's    LABS Recent Labs    06/12/18 1744 06/13/18 0224 06/14/18 0350  HGB 11.9* 11.1* 11.1*  WBC 12.9* 9.4 9.9  PLT 154 141* 146*   Recent Labs    06/13/18 0224 06/14/18 0350  NA 138 139  K 4.2 4.2  CL 107 109  CO2 21* 21*  BUN 27* 30*  CREATININE 1.55* 1.65*  GLUCOSE 221* 143*   Recent Labs    06/13/18 0224  INR 1.17     Assessment/Plan:    Nondisplaced R sacral ala, superior and inferior pubic rami fractures  WBAT Up with therapy Rx for hydocodone sent to patient's pharmacy Dispostion: CIR vs SNF Plan for outpatient visit with Dr. Doran Durand in 2-3 weeks.   Mechele Claude PA-C [facility] Office:  [phone]

## 2018-06-14 NOTE — H&P (Signed)
Physical Medicine and Rehabilitation Admission H&P    Chief Complaint  Patient presents with  . Functional deficits due to pelvic fractures.    HPI: Tyler [name] is an 85 year old male with history of T2DM, prostate cancer, HTN, CVA/TIA; who was admitted on 06/12/2018 after a fall and onset of right hip/groin pain.  History taken from chart review and wife.  CT pelvis done revealing nondisplaced fracture of right sacral ala and superior/inferior pubic rami. Dr. Doran Durand consulted for input and recommended WBAT. He has had issues with acute on chronic renal failure as well as acute blood loss anemia with drop in Hgb to 10.9 and thrombocytopenia with platelets down to 145.  Therapy initiated and patient continues to be limited by pain, weakness and balance deficits. CIR recommended due to functional decline.    Review of Systems  Constitutional: Negative for chills and fever.  Eyes: Negative for blurred vision and double vision.  Respiratory: Negative for cough and shortness of breath.   Cardiovascular: Negative for palpitations.  Gastrointestinal: Positive for heartburn. Negative for abdominal pain, constipation and nausea.  Genitourinary: Positive for frequency and urgency (gets up 3-4 times at night).  Musculoskeletal: Positive for joint pain (right hip/groin pain).  Skin: Negative for itching.  Neurological: Positive for focal weakness. Negative for dizziness and headaches.  Psychiatric/Behavioral: The patient is nervous/anxious (has panic attacks in am--meds increased few weeks ago) and has insomnia.   All other systems reviewed and are negative.     Past Medical History:  Diagnosis Date  . Cancer [facility])    Prostate  . Diabetes mellitus without complication ([place])   . Hypertension   . Kidney stones   . Stroke [facility])     Past Surgical History:  Procedure Laterality Date  . CARDIAC CATHETERIZATION    . FEMUR IM NAIL Right 11/17/2015   Procedure: INTRAMEDULLARY (IM) NAIL  FEMORAL;  Surgeon: Marybelle Killings, MD;  Location: [place];  Service: Orthopedics;  Laterality: Right;  . PROSTATE SURGERY      Family History  Problem Relation Age of Onset  . Emphysema Father   . Heart Problems Mother     Social History:  Married. Independent with quad cane PTA. He quit smoking cigarettes 50 years ago and does not use smokeless tobacco. He drinks a glass of wine a couple of times a year. He does not use illicit drugs.    Allergies  Allergen Reactions  . Sulfa Drugs Cross Reactors Anaphylaxis and Rash    Lungs fill with fluid   Medications Prior to Admission  Medication Sig Dispense Refill  . amLODipine (NORVASC) 2.5 MG tablet Take 2.5 mg by mouth at bedtime.     [name] atorvastatin (LIPITOR) 20 MG tablet Take 20 mg by mouth daily.     . cholecalciferol (VITAMIN D) 1000 UNITS tablet Take 1,000 Units by mouth at bedtime.     . clopidogrel (PLAVIX) 75 MG tablet Take 1 tablet (75 mg total) by mouth daily with breakfast. 30 tablet 6  . Flaxseed, Linseed, (FLAXSEED OIL) 1000 MG CAPS Take 1,000 mg by mouth at bedtime.     [name] glimepiride (AMARYL) 2 MG tablet Take 2 mg by mouth daily.    [name] losartan (COZAAR) 50 MG tablet Take 50 mg by mouth daily.     [name] MELATONIN PO Take 1 tablet by mouth at bedtime.    . sertraline (ZOLOFT) 50 MG tablet Take 100 mg by mouth daily.    [name]  vitamin B-12 (CYANOCOBALAMIN) 1000 MCG tablet Take 1,000 mcg by mouth daily.    [name] docusate sodium (COLACE) 100 MG capsule Take 1 capsule (100 mg total) by mouth 2 (two) times daily. (Patient not taking: Reported on 06/12/2018) 10 capsule 0  . Lancets (ONETOUCH ULTRASOFT) lancets     . ONE TOUCH ULTRA TEST test strip     . pantoprazole (PROTONIX) 40 MG tablet Take 1 tablet (40 mg total) by mouth daily. (Patient not taking: Reported on 06/12/2018) 30 tablet 1    Drug Regimen Review  Drug regimen was reviewed and remains appropriate with no significant issues identified  Home: Home Living Family/patient expects to be  discharged to:: Inpatient rehab Living Arrangements: Spouse/significant other Available Help at Discharge: Family, Available 24 hours/day Type of Home: House Home Access: Level entry Home Layout: Two level Alternate Level Stairs-Number of Steps: 3 Alternate Level Stairs-Rails: Right(from prior chart) Bathroom Shower/Tub: Multimedia programmer: Handicapped height Home Equipment: Civil engineer, contracting - built in, [employer] - tub/shower, Engineer, manufacturing held shower head, Cane - quad(from prior chart)   Functional History: Prior Function Level of Independence: Independent with assistive device(s)  Functional Status:  Mobility: Bed Mobility Overal bed mobility: Needs Assistance Bed Mobility: Sit to Supine Supine to sit: Mod assist, HOB elevated Sit to supine: Max assist, HOB elevated General bed mobility comments: maxA for scooting hips to EOB with HOB elevated and using pad to move hips to EOB. Pt able to sit upright for mobility. Transfers Overall transfer level: Needs assistance Equipment used: Rolling walker (2 wheeled) Transfers: Sit to/from Stand Sit to Stand: Max assist, From elevated surface General transfer comment: maxA for power up; maximal verbal cues for hand placement and sequencing to recliner. Ambulation/Gait Ambulation/Gait assistance: Mod assist, +2 safety/equipment Gait Distance (Feet): 3 Feet Assistive device: Rolling walker (2 wheeled) Gait Pattern/deviations: Step-to pattern, Decreased stride length, Decreased stance time - right, Antalgic General Gait Details: antalgic, limited R LE WBing tolerance due to pain, increased bilat UE support, verbal cues for stepping sequence, increased time, increased R Hip pain Gait velocity: slow Gait velocity interpretation: <1.31 ft/sec, indicative of household ambulator    ADL: ADL Overall ADL's : Needs assistance/impaired Eating/Feeding: Set up, Sitting(could not cut up own meal) Grooming: Set up, Sitting Upper Body Bathing: Set  up, Sitting Lower Body Bathing: Moderate assistance Lower Body Bathing Details (indicate cue type and reason): Mod A sit<>stand and maintain due to tendency for posterior lean Upper Body Dressing : Set up, Sitting Lower Body Dressing: Moderate assistance Lower Body Dressing Details (indicate cue type and reason): Mod A sit<>stand and maintain due to tendency for posterior lean Toilet Transfer: Moderate assistance, Ambulation, +2 for safety/equipment, RW Toilet Transfer Details (indicate cue type and reason): bed>around to recliner Toileting- Clothing Manipulation and Hygiene: Moderate assistance Toileting - Clothing Manipulation Details (indicate cue type and reason): Mod A sit<>stand and maintain due to tendency for posterior lean  Functional mobility during ADLs: Maximal assistance, Rolling walker(MaxA +1 for transfer from bed- elevated to recliner ) General ADL Comments: set-upA for UB  to maxA for LB ADL due to pain  Cognition: Cognition Overall Cognitive Status: Within Functional Limits for tasks assessed Cognition Arousal/Alertness: Awake/alert Behavior During Therapy: WFL for tasks assessed/performed Overall Cognitive Status: Within Functional Limits for tasks assessed General Comments: pt very motivated to get better and go home   Blood pressure (!) 142/56, pulse 71, temperature 98.7 F (37.1 C), resp. rate 17, height 5\' 11"  (1.803 m), weight 74.8  kg, SpO2 96 %. Physical Exam  Nursing note and vitals reviewed. Constitutional: He is oriented to person, place, and time. He appears well-developed and well-nourished.  HENT:  Head: Normocephalic and atraumatic.  Eyes: EOM are normal. Right eye exhibits no discharge. Left eye exhibits no discharge.  Neck: Normal range of motion. Neck supple.  Cardiovascular: Normal rate and regular rhythm.  Respiratory: Effort normal and breath sounds normal.  GI: Soft. Bowel sounds are normal.  Neurological: He is alert and oriented to person,  place, and time.  HOH.  Able to follow basic commands without difficulty.  Motor: Right upper extremity: 4+/5 Left upper extremity: 5/5 Right lower extremity: 4/5 (some pain inhibition) Patient weakness  Skin:  Well healed old incision right hip.   Psychiatric: His speech is delayed. He is slowed. Cognition and memory are impaired.    Results for orders placed or performed during the hospital encounter of 06/12/18 (from the past 48 hour(s))  Glucose, capillary     Status: Abnormal   Collection Time: 06/13/18  5:03 PM  Result Value Ref Range   Glucose-Capillary 126 (H) 70 - 99 mg/dL   Comment 1 Notify RN    Comment 2 Document in Chart   Glucose, capillary     Status: Abnormal   Collection Time: 06/13/18 10:08 PM  Result Value Ref Range   Glucose-Capillary 200 (H) 70 - 99 mg/dL   Comment 1 Notify RN    Comment 2 Document in Chart   Basic metabolic panel     Status: Abnormal   Collection Time: 06/14/18  3:50 AM  Result Value Ref Range   Sodium 139 135 - 145 mmol/L   Potassium 4.2 3.5 - 5.1 mmol/L   Chloride 109 98 - 111 mmol/L   CO2 21 (L) 22 - 32 mmol/L   Glucose, Bld 143 (H) 70 - 99 mg/dL   BUN 30 (H) 8 - 23 mg/dL   Creatinine, Ser 1.65 (H) 0.61 - 1.24 mg/dL   Calcium 9.0 8.9 - 10.3 mg/dL   GFR calc non Af Amer 37 (L) >60 mL/min   GFR calc Af Amer 43 (L) >60 mL/min   Anion gap 9 5 - 15    Comment: Performed at [facility], [address] [place], [place] [address]  CBC     Status: Abnormal   Collection Time: 06/14/18  3:50 AM  Result Value Ref Range   WBC 9.9 4.0 - 10.5 K/uL   RBC 3.46 (L) 4.22 - 5.81 MIL/uL   Hemoglobin 11.1 (L) 13.0 - 17.0 g/dL   HCT 32.9 (L) 39.0 - 52.0 %   MCV 95.1 80.0 - 100.0 fL   MCH 32.1 26.0 - 34.0 pg   MCHC 33.7 30.0 - 36.0 g/dL   RDW 13.0 11.5 - 15.5 %   Platelets 146 (L) 150 - 400 K/uL   nRBC 0.0 0.0 - 0.2 %    Comment: Performed at [facility], [place] [address] [place], [place] [address]  Glucose, capillary     Status:  Abnormal   Collection Time: 06/14/18  8:15 AM  Result Value Ref Range   Glucose-Capillary 123 (H) 70 - 99 mg/dL  Glucose, capillary     Status: Abnormal   Collection Time: 06/14/18 12:30 PM  Result Value Ref Range   Glucose-Capillary 155 (H) 70 - 99 mg/dL  Glucose, capillary     Status: None   Collection Time: 06/14/18  5:28 PM  Result Value Ref Range  Glucose-Capillary 94 70 - 99 mg/dL  Glucose, capillary     Status: Abnormal   Collection Time: 06/14/18  9:35 PM  Result Value Ref Range   Glucose-Capillary 181 (H) 70 - 99 mg/dL  Basic metabolic panel     Status: Abnormal   Collection Time: 06/15/18  1:57 AM  Result Value Ref Range   Sodium 138 135 - 145 mmol/L   Potassium 4.1 3.5 - 5.1 mmol/L   Chloride 107 98 - 111 mmol/L   CO2 21 (L) 22 - 32 mmol/L   Glucose, Bld 114 (H) 70 - 99 mg/dL   BUN 36 (H) 8 - 23 mg/dL   Creatinine, Ser 1.61 (H) 0.61 - 1.24 mg/dL   Calcium 9.1 8.9 - 10.3 mg/dL   GFR calc non Af Amer 38 (L) >60 mL/min   GFR calc Af Amer 44 (L) >60 mL/min   Anion gap 10 5 - 15    Comment: Performed at [facility] [address], [place], [place] [address]  CBC     Status: Abnormal   Collection Time: 06/15/18  1:57 AM  Result Value Ref Range   WBC 9.9 4.0 - 10.5 K/uL   RBC 3.37 (L) 4.22 - 5.81 MIL/uL   Hemoglobin 10.9 (L) 13.0 - 17.0 g/dL   HCT 32.3 (L) 39.0 - 52.0 %   MCV 95.8 80.0 - 100.0 fL   MCH 32.3 26.0 - 34.0 pg   MCHC 33.7 30.0 - 36.0 g/dL   RDW 13.1 11.5 - 15.5 %   Platelets 145 (L) 150 - 400 K/uL   nRBC 0.0 0.0 - 0.2 %    Comment: Performed at [facility], [place] [address], [place], [place] [address]  Glucose, capillary     Status: Abnormal   Collection Time: 06/15/18  8:28 AM  Result Value Ref Range   Glucose-Capillary 122 (H) 70 - 99 mg/dL  Glucose, capillary     Status: Abnormal   Collection Time: 06/15/18 12:05 PM  Result Value Ref Range   Glucose-Capillary 222 (H) 70 - 99 mg/dL   No results found.     Medical Problem List  and Plan: 1.  Functional decline secondary to Pelvic fracture and pain. 2.  DVT Prophylaxis/Anticoagulation: Pharmaceutical: Lovenox 3. Pain Management: Continue tylenol qid with ultram prn. Wife does not want patient on any narcotics.  4. H/o Anxiety disorder/Mood: Continue Zoloft daily. LCSW to follow for evaluation and support.  5. Neuropsych: This patient is capable of making decisions on his own behalf. 6. Skin/Wound Care: Routine pressure relief measures.  7. Fluids/Electrolytes/Nutrition: Monitor I/O. Check lytes in am.  8. T2DM: Will resume lower dose Amaryl. Monitor BS ac/hs and use SSI for elevated BS. Add bedtime snack 9. HTN: Monitor BP bid--continue Norvasc at bedtime and Cozaar in am.  10. Acute on chronic renal failure: Encourage fluid intake.  36.  H/o CVA: On Plavix and Lipitor.  12. ABLA/Thrombocytopenia: Will recheck CBC in am. Change heparin to Lovenox.     Post Admission Physician Evaluation: 1. Preadmission assessment reviewed and changes made below. 2. Functional deficits secondary  to pelvic fracture. 3. Patient is admitted to receive collaborative, interdisciplinary care between the physiatrist, rehab nursing staff, and therapy team. 4. Patient's level of medical complexity and substantial therapy needs in context of that medical necessity cannot be provided at a lesser intensity of care such as a SNF. 5. Patient has experienced substantial functional loss from his/her baseline which was documented above  under the "Functional History" and "Functional Status" headings.  Judging by the patient's diagnosis, physical exam, and functional history, the patient has potential for functional progress which will result in measurable gains while on inpatient rehab.  These gains will be of substantial and practical use upon discharge  in facilitating mobility and self-care at the household level. 6. Physiatrist will provide 24 hour management of medical needs as well as oversight  of the therapy plan/treatment and provide guidance as appropriate regarding the interaction of the two. 7. 24 hour rehab nursing will assist with bladder management, bowel management, safety, skin/wound care, disease management, medication administration, pain management and patient education  and help integrate therapy concepts, techniques,education, etc. 8. PT will assess and treat for/with: Lower extremity strength, range of motion, stamina, balance, functional mobility, safety, adaptive techniques and equipment, wound care, coping skills, pain control, education. Goals are: Min a. 9. OT will assess and treat for/with: ADL's, functional mobility, safety, upper extremity strength, adaptive techniques and equipment, wound mgt, ego support, and community reintegration.   Goals are: Min a. Therapy may proceed with showering this patient. 10. Case Management and Social Worker will assess and treat for psychological issues and discharge planning. 11. Team conference will be held weekly to assess progress toward goals and to determine barriers to discharge. 12. Patient will receive at least 3 hours of therapy per day at least 5 days per week. 13. ELOS: 14-18 days.       14. Prognosis:  good  I have personally performed a face to face diagnostic evaluation, including, but not limited to relevant history and physical exam findings, of this patient and developed relevant assessment and plan.  Additionally, I have reviewed and concur with the physician assistant's documentation above.  Delice Lesch, MD, ABPMR Bary Leriche, PA-C 06/15/2018

## 2018-06-14 NOTE — Progress Notes (Signed)
Inpatient Rehabilitation-Admissions Coordinator    Met with patient and his wife at the bedside to discuss team's recommendation for inpatient rehabilitation. Shared booklets, expectations while in CIR, expected length of stay, and anticipated functional level at DC. Wife able to provide caregiver support. AC will begin insurance authorization process for possible admit.   Jhonnie Garner, OTR/L  Rehab Admissions Coordinator  [phone] 06/14/2018 2:42 PM

## 2018-06-15 ENCOUNTER — Inpatient Hospital Stay (HOSPITAL_COMMUNITY)
Admission: RE | Admit: 2018-06-15 | Discharge: 2018-06-27 | DRG: 560 | Disposition: A | Discharge status: Home Health | Payer: PPO | Source: Intra-hospital | Attending: Physical Medicine & Rehabilitation | Admitting: Physical Medicine & Rehabilitation

## 2018-06-15 ENCOUNTER — Other Ambulatory Visit: Payer: Self-pay

## 2018-06-15 ENCOUNTER — Encounter (HOSPITAL_COMMUNITY): Payer: Self-pay

## 2018-06-15 DIAGNOSIS — D696 Thrombocytopenia, unspecified: Secondary | ICD-10-CM | POA: Diagnosis not present

## 2018-06-15 DIAGNOSIS — N179 Acute kidney failure, unspecified: Secondary | ICD-10-CM | POA: Diagnosis present

## 2018-06-15 DIAGNOSIS — S32301A Unspecified fracture of right ilium, initial encounter for closed fracture: Secondary | ICD-10-CM

## 2018-06-15 DIAGNOSIS — N183 Chronic kidney disease, stage 3 unspecified: Secondary | ICD-10-CM

## 2018-06-15 DIAGNOSIS — S329XXA Fracture of unspecified parts of lumbosacral spine and pelvis, initial encounter for closed fracture: Secondary | ICD-10-CM | POA: Diagnosis present

## 2018-06-15 DIAGNOSIS — F410 Panic disorder [episodic paroxysmal anxiety] without agoraphobia: Secondary | ICD-10-CM | POA: Diagnosis not present

## 2018-06-15 DIAGNOSIS — E1122 Type 2 diabetes mellitus with diabetic chronic kidney disease: Secondary | ICD-10-CM | POA: Diagnosis present

## 2018-06-15 DIAGNOSIS — Z8659 Personal history of other mental and behavioral disorders: Secondary | ICD-10-CM | POA: Diagnosis not present

## 2018-06-15 DIAGNOSIS — E119 Type 2 diabetes mellitus without complications: Secondary | ICD-10-CM

## 2018-06-15 DIAGNOSIS — W19XXXD Unspecified fall, subsequent encounter: Secondary | ICD-10-CM | POA: Diagnosis present

## 2018-06-15 DIAGNOSIS — F419 Anxiety disorder, unspecified: Secondary | ICD-10-CM | POA: Diagnosis not present

## 2018-06-15 DIAGNOSIS — Z7902 Long term (current) use of antithrombotics/antiplatelets: Secondary | ICD-10-CM

## 2018-06-15 DIAGNOSIS — Z87891 Personal history of nicotine dependence: Secondary | ICD-10-CM | POA: Diagnosis not present

## 2018-06-15 DIAGNOSIS — R200 Anesthesia of skin: Secondary | ICD-10-CM | POA: Diagnosis not present

## 2018-06-15 DIAGNOSIS — Z8673 Personal history of transient ischemic attack (TIA), and cerebral infarction without residual deficits: Secondary | ICD-10-CM | POA: Diagnosis not present

## 2018-06-15 DIAGNOSIS — R42 Dizziness and giddiness: Secondary | ICD-10-CM | POA: Diagnosis not present

## 2018-06-15 DIAGNOSIS — R531 Weakness: Secondary | ICD-10-CM | POA: Diagnosis not present

## 2018-06-15 DIAGNOSIS — Z882 Allergy status to sulfonamides status: Secondary | ICD-10-CM

## 2018-06-15 DIAGNOSIS — Z79899 Other long term (current) drug therapy: Secondary | ICD-10-CM

## 2018-06-15 DIAGNOSIS — R0989 Other specified symptoms and signs involving the circulatory and respiratory systems: Secondary | ICD-10-CM

## 2018-06-15 DIAGNOSIS — F411 Generalized anxiety disorder: Secondary | ICD-10-CM | POA: Diagnosis not present

## 2018-06-15 DIAGNOSIS — D62 Acute posthemorrhagic anemia: Secondary | ICD-10-CM | POA: Diagnosis present

## 2018-06-15 DIAGNOSIS — S32110D Nondisplaced Zone I fracture of sacrum, subsequent encounter for fracture with routine healing: Principal | ICD-10-CM

## 2018-06-15 DIAGNOSIS — I129 Hypertensive chronic kidney disease with stage 1 through stage 4 chronic kidney disease, or unspecified chronic kidney disease: Secondary | ICD-10-CM | POA: Diagnosis not present

## 2018-06-15 DIAGNOSIS — I10 Essential (primary) hypertension: Secondary | ICD-10-CM | POA: Diagnosis not present

## 2018-06-15 DIAGNOSIS — Z8781 Personal history of (healed) traumatic fracture: Secondary | ICD-10-CM | POA: Diagnosis not present

## 2018-06-15 DIAGNOSIS — Z8546 Personal history of malignant neoplasm of prostate: Secondary | ICD-10-CM

## 2018-06-15 DIAGNOSIS — K5900 Constipation, unspecified: Secondary | ICD-10-CM | POA: Diagnosis not present

## 2018-06-15 DIAGNOSIS — R7309 Other abnormal glucose: Secondary | ICD-10-CM | POA: Diagnosis not present

## 2018-06-15 DIAGNOSIS — S32591A Other specified fracture of right pubis, initial encounter for closed fracture: Secondary | ICD-10-CM | POA: Diagnosis not present

## 2018-06-15 DIAGNOSIS — S32501A Unspecified fracture of right pubis, initial encounter for closed fracture: Secondary | ICD-10-CM | POA: Diagnosis not present

## 2018-06-15 DIAGNOSIS — S3282XD Multiple fractures of pelvis without disruption of pelvic ring, subsequent encounter for fracture with routine healing: Secondary | ICD-10-CM | POA: Diagnosis not present

## 2018-06-15 LAB — CBC
HEMATOCRIT: 32.3 % — AB (ref 39.0–52.0)
Hemoglobin: 10.9 g/dL — ABNORMAL LOW (ref 13.0–17.0)
MCH: 32.3 pg (ref 26.0–34.0)
MCHC: 33.7 g/dL (ref 30.0–36.0)
MCV: 95.8 fL (ref 80.0–100.0)
Platelets: 145 10*3/uL — ABNORMAL LOW (ref 150–400)
RBC: 3.37 MIL/uL — ABNORMAL LOW (ref 4.22–5.81)
RDW: 13.1 % (ref 11.5–15.5)
WBC: 9.9 10*3/uL (ref 4.0–10.5)
nRBC: 0 % (ref 0.0–0.2)

## 2018-06-15 LAB — BASIC METABOLIC PANEL
Anion gap: 10 (ref 5–15)
BUN: 36 mg/dL — ABNORMAL HIGH (ref 8–23)
CO2: 21 mmol/L — AB (ref 22–32)
Calcium: 9.1 mg/dL (ref 8.9–10.3)
Chloride: 107 mmol/L (ref 98–111)
Creatinine, Ser: 1.61 mg/dL — ABNORMAL HIGH (ref 0.61–1.24)
GFR calc Af Amer: 44 mL/min — ABNORMAL LOW (ref >60)
GFR calc non Af Amer: 38 mL/min — ABNORMAL LOW (ref >60)
Glucose, Bld: 114 mg/dL — ABNORMAL HIGH (ref 70–99)
Potassium: 4.1 mmol/L (ref 3.5–5.1)
Sodium: 138 mmol/L (ref 135–145)

## 2018-06-15 LAB — GLUCOSE, CAPILLARY
Glucose-Capillary: 122 mg/dL — ABNORMAL HIGH (ref 70–99)
Glucose-Capillary: 222 mg/dL — ABNORMAL HIGH (ref 70–99)
Glucose-Capillary: 131 mg/dL — ABNORMAL HIGH (ref 70–99)

## 2018-06-15 MED ORDER — PROCHLORPERAZINE MALEATE 5 MG PO TABS: 5.0000 mg | ORAL_TABLET | Freq: Four times a day (QID) | ORAL | Status: DC | PRN

## 2018-06-15 MED ORDER — FLEET ENEMA 7-19 GM/118ML RE ENEM: 1.0000 | ENEMA | Freq: Once | RECTAL | Status: DC | PRN

## 2018-06-15 MED ORDER — ACETAMINOPHEN 325 MG PO TABS
325.0000 mg | ORAL_TABLET | ORAL | Status: DC | PRN
  Administered 2018-06-19 – 2018-06-27 (×3): 650 mg via ORAL
  Filled 2018-06-15 (×2): qty 2

## 2018-06-15 MED ORDER — GLIMEPIRIDE 1 MG PO TABS
1.0000 mg | ORAL_TABLET | Freq: Every day | ORAL | Status: DC
  Administered 2018-06-16 – 2018-06-27 (×12): 1 mg via ORAL
  Filled 2018-06-15 (×12): qty 1

## 2018-06-15 MED ORDER — DIPHENHYDRAMINE HCL 12.5 MG/5ML PO ELIX
12.5000 mg | ORAL_SOLUTION | Freq: Four times a day (QID) | ORAL | Status: DC | PRN
  Administered 2018-06-17: 25 mg via ORAL
  Filled 2018-06-15: qty 10

## 2018-06-15 MED ORDER — PANTOPRAZOLE SODIUM 40 MG PO TBEC
40.0000 mg | DELAYED_RELEASE_TABLET | Freq: Every day | ORAL | Status: DC
  Administered 2018-06-15 – 2018-06-27 (×13): 40 mg via ORAL
  Filled 2018-06-15 (×13): qty 1

## 2018-06-15 MED ORDER — PROCHLORPERAZINE 25 MG RE SUPP: 12.5000 mg | Freq: Four times a day (QID) | RECTAL | Status: DC | PRN

## 2018-06-15 MED ORDER — INSULIN ASPART 100 UNIT/ML ~~LOC~~ SOLN
0.0000 [IU] | Freq: Every day | SUBCUTANEOUS | Status: DC
  Administered 2018-06-23 – 2018-06-26 (×2): 2 [IU] via SUBCUTANEOUS

## 2018-06-15 MED ORDER — DOCUSATE SODIUM 100 MG PO CAPS
100.0000 mg | ORAL_CAPSULE | Freq: Two times a day (BID) | ORAL | Status: DC
  Administered 2018-06-15 – 2018-06-27 (×24): 100 mg via ORAL
  Filled 2018-06-15 (×24): qty 1

## 2018-06-15 MED ORDER — CLOPIDOGREL BISULFATE 75 MG PO TABS
75.0000 mg | ORAL_TABLET | Freq: Every day | ORAL | Status: DC
  Administered 2018-06-16 – 2018-06-27 (×12): 75 mg via ORAL
  Filled 2018-06-15 (×12): qty 1

## 2018-06-15 MED ORDER — SERTRALINE HCL 100 MG PO TABS
100.0000 mg | ORAL_TABLET | Freq: Every day | ORAL | Status: DC
  Administered 2018-06-16 – 2018-06-27 (×12): 100 mg via ORAL
  Filled 2018-06-15 (×13): qty 1

## 2018-06-15 MED ORDER — TRAMADOL HCL 50 MG PO TABS
50.0000 mg | ORAL_TABLET | Freq: Four times a day (QID) | ORAL | Status: DC | PRN
  Administered 2018-06-18: 50 mg via ORAL
  Filled 2018-06-15 (×2): qty 1

## 2018-06-15 MED ORDER — ADULT MULTIVITAMIN W/MINERALS CH
1.0000 | ORAL_TABLET | Freq: Every day | ORAL | Status: DC
  Administered 2018-06-16 – 2018-06-27 (×12): 1 via ORAL
  Filled 2018-06-15 (×12): qty 1

## 2018-06-15 MED ORDER — POLYETHYLENE GLYCOL 3350 17 G PO PACK: 17.0000 g | PACK | Freq: Every day | ORAL | Status: DC | PRN

## 2018-06-15 MED ORDER — INSULIN ASPART 100 UNIT/ML ~~LOC~~ SOLN
0.0000 [IU] | Freq: Three times a day (TID) | SUBCUTANEOUS | Status: DC
  Administered 2018-06-16: 1 [IU] via SUBCUTANEOUS
  Administered 2018-06-16: 2 [IU] via SUBCUTANEOUS
  Administered 2018-06-17: 1 [IU] via SUBCUTANEOUS
  Administered 2018-06-17: 2 [IU] via SUBCUTANEOUS
  Administered 2018-06-18: 1 [IU] via SUBCUTANEOUS
  Administered 2018-06-18: 2 [IU] via SUBCUTANEOUS
  Administered 2018-06-19: 1 [IU] via SUBCUTANEOUS
  Administered 2018-06-19: 3 [IU] via SUBCUTANEOUS
  Administered 2018-06-20 – 2018-06-22 (×4): 1 [IU] via SUBCUTANEOUS
  Administered 2018-06-22 – 2018-06-24 (×3): 2 [IU] via SUBCUTANEOUS
  Administered 2018-06-25: 1 [IU] via SUBCUTANEOUS
  Administered 2018-06-25 – 2018-06-26 (×2): 2 [IU] via SUBCUTANEOUS

## 2018-06-15 MED ORDER — VITAMIN B-12 1000 MCG PO TABS
1000.0000 ug | ORAL_TABLET | Freq: Every day | ORAL | Status: DC
  Administered 2018-06-16 – 2018-06-27 (×12): 1000 ug via ORAL
  Filled 2018-06-15 (×12): qty 1

## 2018-06-15 MED ORDER — AMLODIPINE BESYLATE 2.5 MG PO TABS
2.5000 mg | ORAL_TABLET | Freq: Every day | ORAL | Status: DC
  Administered 2018-06-15 – 2018-06-26 (×12): 2.5 mg via ORAL
  Filled 2018-06-15 (×12): qty 1

## 2018-06-15 MED ORDER — POLYETHYLENE GLYCOL 3350 17 G PO PACK
17.0000 g | PACK | Freq: Every day | ORAL | Status: DC | PRN
  Administered 2018-06-24: 17 g via ORAL
  Filled 2018-06-15: qty 1

## 2018-06-15 MED ORDER — METHOCARBAMOL 500 MG PO TABS: 500.0000 mg | ORAL_TABLET | Freq: Four times a day (QID) | ORAL | Status: DC | PRN

## 2018-06-15 MED ORDER — ALUM & MAG HYDROXIDE-SIMETH 200-200-20 MG/5ML PO SUSP
30.0000 mL | ORAL | Status: DC | PRN
  Administered 2018-06-22: 30 mL via ORAL
  Filled 2018-06-15: qty 30

## 2018-06-15 MED ORDER — ENOXAPARIN SODIUM 40 MG/0.4ML ~~LOC~~ SOLN
40.0000 mg | SUBCUTANEOUS | Status: DC
  Administered 2018-06-15 – 2018-06-26 (×12): 40 mg via SUBCUTANEOUS
  Filled 2018-06-15 (×12): qty 0.4

## 2018-06-15 MED ORDER — ACETAMINOPHEN 325 MG PO TABS
650.0000 mg | ORAL_TABLET | Freq: Four times a day (QID) | ORAL | Status: DC
  Administered 2018-06-15 – 2018-06-27 (×44): 650 mg via ORAL
  Filled 2018-06-15 (×47): qty 2

## 2018-06-15 MED ORDER — PROCHLORPERAZINE EDISYLATE 10 MG/2ML IJ SOLN: 5.0000 mg | Freq: Four times a day (QID) | INTRAMUSCULAR | Status: DC | PRN

## 2018-06-15 MED ORDER — ATORVASTATIN CALCIUM 10 MG PO TABS
20.0000 mg | ORAL_TABLET | Freq: Every day | ORAL | Status: DC
  Administered 2018-06-16 – 2018-06-27 (×12): 20 mg via ORAL
  Filled 2018-06-15 (×12): qty 2

## 2018-06-15 MED ORDER — LOSARTAN POTASSIUM 50 MG PO TABS
50.0000 mg | ORAL_TABLET | Freq: Every day | ORAL | Status: DC
  Administered 2018-06-16 – 2018-06-27 (×12): 50 mg via ORAL
  Filled 2018-06-15 (×12): qty 1

## 2018-06-15 MED ORDER — CAMPHOR-MENTHOL 0.5-0.5 % EX LOTN
TOPICAL_LOTION | CUTANEOUS | Status: DC | PRN
  Filled 2018-06-15: qty 222

## 2018-06-15 MED ORDER — VITAMIN D 25 MCG (1000 UNIT) PO TABS
1000.0000 [IU] | ORAL_TABLET | Freq: Every day | ORAL | Status: DC
  Administered 2018-06-15 – 2018-06-26 (×12): 1000 [IU] via ORAL
  Filled 2018-06-15 (×12): qty 1

## 2018-06-15 MED ORDER — TRAZODONE HCL 50 MG PO TABS
25.0000 mg | ORAL_TABLET | Freq: Every evening | ORAL | Status: DC | PRN
  Administered 2018-06-20 – 2018-06-23 (×3): 50 mg via ORAL
  Filled 2018-06-15 (×4): qty 1

## 2018-06-15 MED ORDER — BISACODYL 10 MG RE SUPP: 10.0000 mg | Freq: Every day | RECTAL | Status: DC | PRN

## 2018-06-15 MED ORDER — GUAIFENESIN-DM 100-10 MG/5ML PO SYRP: 5.0000 mL | ORAL_SOLUTION | Freq: Four times a day (QID) | ORAL | Status: DC | PRN

## 2018-06-15 MED ORDER — MELATONIN 3 MG PO TABS
3.0000 mg | ORAL_TABLET | Freq: Every day | ORAL | Status: DC
  Administered 2018-06-15 – 2018-06-26 (×12): 3 mg via ORAL
  Filled 2018-06-15 (×12): qty 1

## 2018-06-15 NOTE — IPOC Note (Signed)
Overall Plan of Care [facility]) Patient Details Name: Tyler [name] MRN: [id number] DOB: 08-08-1928  Admitting Diagnosis: Pelvic fracture  Hospital Problems: Active Problems:   Pelvic fracture (HCC)   Acute blood loss anemia   AKI (acute kidney injury) ([place])   Diabetes mellitus type 2 in nonobese [facility])     Functional Problem List: Nursing Pain, Safety, Motor  PT Balance, Endurance, Motor, Pain  OT Balance, Pain, Endurance, Safety  SLP    TR         Basic ADL's: OT Grooming, Bathing, Dressing, Toileting     Advanced  ADL's: OT       Transfers: PT Bed Mobility, Floor, Bed to Chair, Car, Manufacturing systems engineer, Metallurgist: PT Ambulation, Emergency planning/management officer, Stairs     Additional Impairments: OT    SLP        TR      Anticipated Outcomes Item Anticipated Outcome  Self Feeding independent  Swallowing      Basic self-care  supervision  Toileting  supervision   Bathroom Transfers supervision  Bowel/Bladder  patient to be continent of bowel and bladder during admission  Transfers  supervision with LRAD  Locomotion  supervision with LRAD  Communication     Cognition     Pain  Patient will be pain free or pain less than 3 during admission  Safety/Judgment  Patient will be free from falls and adhere to safety plan   Therapy Plan: PT Intensity: Minimum of 1-2 x/day ,45 to 90 minutes PT Frequency: 5 out of 7 days PT Duration Estimated Length of Stay: 10-14 days OT Intensity: Minimum of 1-2 x/day, 45 to 90 minutes OT Frequency: 5 out of 7 days OT Duration/Estimated Length of Stay: 7-9 days      Team Interventions: Nursing Interventions Patient/Family Education, Pain Management, Disease Management/Prevention  PT interventions Ambulation/gait training, Cognitive remediation/compensation, Discharge planning, DME/adaptive equipment instruction, Functional mobility training, Pain management, Psychosocial support, Splinting/orthotics, Therapeutic  Activities, UE/LE Strength taining/ROM, Visual/perceptual remediation/compensation, Training and development officer, Community reintegration, Disease management/prevention, Technical sales engineer stimulation, Neuromuscular re-education, Patient/family education, Skin care/wound management, Stair training, Therapeutic Exercise, UE/LE Coordination activities, Wheelchair propulsion/positioning  OT Interventions Training and development officer, Academic librarian, Discharge planning, Engineer, drilling, Functional mobility training, Pain management, Therapeutic Activities, UE/LE Strength taining/ROM, UE/LE Coordination activities, Therapeutic Exercise, Wheelchair propulsion/positioning, Self Care/advanced ADL retraining, Patient/family education, Neuromuscular re-education  SLP Interventions    TR Interventions    SW/CM Interventions Discharge Planning, Psychosocial Support, Patient/Family Education   Barriers to Discharge MD  Medical stability and Lack of/limited family support  Nursing      PT      OT      SLP      SW       Team Discharge Planning: Destination: PT-Home ,OT- Home , SLP-  Projected Follow-up: PT-Home health PT, OT-  Home health OT, SLP-  Projected Equipment Needs: PT-To be determined, OT- To be determined, SLP-  Equipment Details: PT- , OT-  Patient/family involved in discharge planning: PT- Patient,  OT-Patient, SLP-   MD ELOS: 7-10 days. Medical Rehab Prognosis:  Good Assessment: 85 year old male with history of T2DM, prostate cancer, HTN, CVA/TIA; who was admitted on 06/12/2018 after a fall and onset of right hip/groin pain.  CT pelvis done revealing nondisplaced fracture of right sacral ala and superior/inferior pubic rami. Dr. Doran Durand consulted for input and recommended WBAT. He has had issues with acute on chronic renal failure as well as acute blood  loss anemia with drop in Hgb to 10.9 and thrombocytopenia with platelets down to 145.  Patient with resulting  deficits with mobility, transfers, self-care.  Will set goals for Supervision with PT/OT.   See Team Conference Notes for weekly updates to the plan of care

## 2018-06-15 NOTE — H&P (Signed)
Physical Medicine and Rehabilitation Admission H&P    Chief Complaint  Patient presents with  . Functional deficits due to pelvic fractures.    HPI: Tyler [name] is an 85 year old male with history of T2DM, prostate cancer, HTN, CVA/TIA; who was admitted on 06/12/2018 after a fall and onset of right hip/groin pain.  History taken from chart review and wife.  CT pelvis done revealing nondisplaced fracture of right sacral ala and superior/inferior pubic rami. Dr. Doran Durand consulted for input and recommended WBAT. He has had issues with acute on chronic renal failure as well as acute blood loss anemia with drop in Hgb to 10.9 and thrombocytopenia with platelets down to 145.  Therapy initiated and patient continues to be limited by pain, weakness and balance deficits. CIR recommended due to functional decline.    Review of Systems  Constitutional: Negative for chills and fever.  Eyes: Negative for blurred vision and double vision.  Respiratory: Negative for cough and shortness of breath.   Cardiovascular: Negative for palpitations.  Gastrointestinal: Positive for heartburn. Negative for abdominal pain, constipation and nausea.  Genitourinary: Positive for frequency and urgency (gets up 3-4 times at night).  Musculoskeletal: Positive for joint pain (right hip/groin pain).  Skin: Negative for itching.  Neurological: Positive for focal weakness. Negative for dizziness and headaches.  Psychiatric/Behavioral: The patient is nervous/anxious (has panic attacks in am--meds increased few weeks ago) and has insomnia.   All other systems reviewed and are negative.     Past Medical History:  Diagnosis Date  . Cancer [facility])    Prostate  . Diabetes mellitus without complication ([place])   . Hypertension   . Kidney stones   . Stroke [facility])     Past Surgical History:  Procedure Laterality Date  . CARDIAC CATHETERIZATION    . FEMUR IM NAIL Right 11/17/2015   Procedure: INTRAMEDULLARY (IM) NAIL  FEMORAL;  Surgeon: Marybelle Killings, MD;  Location: [place];  Service: Orthopedics;  Laterality: Right;  . PROSTATE SURGERY      Family History  Problem Relation Age of Onset  . Emphysema Father   . Heart Problems Mother     Social History:  Married. Independent with quad cane PTA. He quit smoking cigarettes 50 years ago and does not use smokeless tobacco. He drinks a glass of wine a couple of times a year. He does not use illicit drugs.    Allergies  Allergen Reactions  . Sulfa Drugs Cross Reactors Anaphylaxis and Rash    Lungs fill with fluid   Medications Prior to Admission  Medication Sig Dispense Refill  . amLODipine (NORVASC) 2.5 MG tablet Take 2.5 mg by mouth at bedtime.     [name] atorvastatin (LIPITOR) 20 MG tablet Take 20 mg by mouth daily.     . cholecalciferol (VITAMIN D) 1000 UNITS tablet Take 1,000 Units by mouth at bedtime.     . clopidogrel (PLAVIX) 75 MG tablet Take 1 tablet (75 mg total) by mouth daily with breakfast. 30 tablet 6  . Flaxseed, Linseed, (FLAXSEED OIL) 1000 MG CAPS Take 1,000 mg by mouth at bedtime.     [name] glimepiride (AMARYL) 2 MG tablet Take 2 mg by mouth daily.    [name] losartan (COZAAR) 50 MG tablet Take 50 mg by mouth daily.     [name] MELATONIN PO Take 1 tablet by mouth at bedtime.    . sertraline (ZOLOFT) 50 MG tablet Take 100 mg by mouth daily.    [name]  vitamin B-12 (CYANOCOBALAMIN) 1000 MCG tablet Take 1,000 mcg by mouth daily.    [name] docusate sodium (COLACE) 100 MG capsule Take 1 capsule (100 mg total) by mouth 2 (two) times daily. (Patient not taking: Reported on 06/12/2018) 10 capsule 0  . Lancets (ONETOUCH ULTRASOFT) lancets     . ONE TOUCH ULTRA TEST test strip     . pantoprazole (PROTONIX) 40 MG tablet Take 1 tablet (40 mg total) by mouth daily. (Patient not taking: Reported on 06/12/2018) 30 tablet 1    Drug Regimen Review  Drug regimen was reviewed and remains appropriate with no significant issues identified  Home: Home Living Family/patient expects to be  discharged to:: Inpatient rehab Living Arrangements: Spouse/significant other Available Help at Discharge: Family, Available 24 hours/day Type of Home: House Home Access: Level entry Home Layout: Two level Alternate Level Stairs-Number of Steps: 3 Alternate Level Stairs-Rails: Right(from prior chart) Bathroom Shower/Tub: Multimedia programmer: Handicapped height Home Equipment: Civil engineer, contracting - built in, [employer] - tub/shower, Engineer, manufacturing held shower head, Cane - quad(from prior chart)   Functional History: Prior Function Level of Independence: Independent with assistive device(s)  Functional Status:  Mobility: Bed Mobility Overal bed mobility: Needs Assistance Bed Mobility: Sit to Supine Supine to sit: Mod assist, HOB elevated Sit to supine: Max assist, HOB elevated General bed mobility comments: maxA for scooting hips to EOB with HOB elevated and using pad to move hips to EOB. Pt able to sit upright for mobility. Transfers Overall transfer level: Needs assistance Equipment used: Rolling walker (2 wheeled) Transfers: Sit to/from Stand Sit to Stand: Max assist, From elevated surface General transfer comment: maxA for power up; maximal verbal cues for hand placement and sequencing to recliner. Ambulation/Gait Ambulation/Gait assistance: Mod assist, +2 safety/equipment Gait Distance (Feet): 3 Feet Assistive device: Rolling walker (2 wheeled) Gait Pattern/deviations: Step-to pattern, Decreased stride length, Decreased stance time - right, Antalgic General Gait Details: antalgic, limited R LE WBing tolerance due to pain, increased bilat UE support, verbal cues for stepping sequence, increased time, increased R Hip pain Gait velocity: slow Gait velocity interpretation: <1.31 ft/sec, indicative of household ambulator    ADL: ADL Overall ADL's : Needs assistance/impaired Eating/Feeding: Set up, Sitting(could not cut up own meal) Grooming: Set up, Sitting Upper Body Bathing: Set  up, Sitting Lower Body Bathing: Moderate assistance Lower Body Bathing Details (indicate cue type and reason): Mod A sit<>stand and maintain due to tendency for posterior lean Upper Body Dressing : Set up, Sitting Lower Body Dressing: Moderate assistance Lower Body Dressing Details (indicate cue type and reason): Mod A sit<>stand and maintain due to tendency for posterior lean Toilet Transfer: Moderate assistance, Ambulation, +2 for safety/equipment, RW Toilet Transfer Details (indicate cue type and reason): bed>around to recliner Toileting- Clothing Manipulation and Hygiene: Moderate assistance Toileting - Clothing Manipulation Details (indicate cue type and reason): Mod A sit<>stand and maintain due to tendency for posterior lean  Functional mobility during ADLs: Maximal assistance, Rolling walker(MaxA +1 for transfer from bed- elevated to recliner ) General ADL Comments: set-upA for UB  to maxA for LB ADL due to pain  Cognition: Cognition Overall Cognitive Status: Within Functional Limits for tasks assessed Cognition Arousal/Alertness: Awake/alert Behavior During Therapy: WFL for tasks assessed/performed Overall Cognitive Status: Within Functional Limits for tasks assessed General Comments: pt very motivated to get better and go home   Blood pressure (!) 142/56, pulse 71, temperature 98.7 F (37.1 C), resp. rate 17, height 5\' 11"  (1.803 m), weight 74.8  kg, SpO2 96 %. Physical Exam  Nursing note and vitals reviewed. Constitutional: He is oriented to person, place, and time. He appears well-developed and well-nourished.  HENT:  Head: Normocephalic and atraumatic.  Eyes: EOM are normal. Right eye exhibits no discharge. Left eye exhibits no discharge.  Neck: Normal range of motion. Neck supple.  Cardiovascular: Normal rate and regular rhythm.  Respiratory: Effort normal and breath sounds normal.  GI: Soft. Bowel sounds are normal.  Neurological: He is alert and oriented to person,  place, and time.  HOH.  Able to follow basic commands without difficulty.  Motor: Right upper extremity: 4+/5 Left upper extremity: 5/5 Right lower extremity: 4/5 (some pain inhibition) Patient weakness  Skin:  Well healed old incision right hip.   Psychiatric: His speech is delayed. He is slowed. Cognition and memory are impaired.    Results for orders placed or performed during the hospital encounter of 06/12/18 (from the past 48 hour(s))  Glucose, capillary     Status: Abnormal   Collection Time: 06/13/18  5:03 PM  Result Value Ref Range   Glucose-Capillary 126 (H) 70 - 99 mg/dL   Comment 1 Notify RN    Comment 2 Document in Chart   Glucose, capillary     Status: Abnormal   Collection Time: 06/13/18 10:08 PM  Result Value Ref Range   Glucose-Capillary 200 (H) 70 - 99 mg/dL   Comment 1 Notify RN    Comment 2 Document in Chart   Basic metabolic panel     Status: Abnormal   Collection Time: 06/14/18  3:50 AM  Result Value Ref Range   Sodium 139 135 - 145 mmol/L   Potassium 4.2 3.5 - 5.1 mmol/L   Chloride 109 98 - 111 mmol/L   CO2 21 (L) 22 - 32 mmol/L   Glucose, Bld 143 (H) 70 - 99 mg/dL   BUN 30 (H) 8 - 23 mg/dL   Creatinine, Ser 1.65 (H) 0.61 - 1.24 mg/dL   Calcium 9.0 8.9 - 10.3 mg/dL   GFR calc non Af Amer 37 (L) >60 mL/min   GFR calc Af Amer 43 (L) >60 mL/min   Anion gap 9 5 - 15    Comment: Performed at [facility], [address] [place],  [address]  CBC     Status: Abnormal   Collection Time: 06/14/18  3:50 AM  Result Value Ref Range   WBC 9.9 4.0 - 10.5 K/uL   RBC 3.46 (L) 4.22 - 5.81 MIL/uL   Hemoglobin 11.1 (L) 13.0 - 17.0 g/dL   HCT 32.9 (L) 39.0 - 52.0 %   MCV 95.1 80.0 - 100.0 fL   MCH 32.1 26.0 - 34.0 pg   MCHC 33.7 30.0 - 36.0 g/dL   RDW 13.0 11.5 - 15.5 %   Platelets 146 (L) 150 - 400 K/uL   nRBC 0.0 0.0 - 0.2 %    Comment: Performed at [facility], [place] [address], [place], [place] [address]  Glucose, capillary     Status:  Abnormal   Collection Time: 06/14/18  8:15 AM  Result Value Ref Range   Glucose-Capillary 123 (H) 70 - 99 mg/dL  Glucose, capillary     Status: Abnormal   Collection Time: 06/14/18 12:30 PM  Result Value Ref Range   Glucose-Capillary 155 (H) 70 - 99 mg/dL  Glucose, capillary     Status: None   Collection Time: 06/14/18  5:28 PM  Result Value Ref Range  Glucose-Capillary 94 70 - 99 mg/dL  Glucose, capillary     Status: Abnormal   Collection Time: 06/14/18  9:35 PM  Result Value Ref Range   Glucose-Capillary 181 (H) 70 - 99 mg/dL  Basic metabolic panel     Status: Abnormal   Collection Time: 06/15/18  1:57 AM  Result Value Ref Range   Sodium 138 135 - 145 mmol/L   Potassium 4.1 3.5 - 5.1 mmol/L   Chloride 107 98 - 111 mmol/L   CO2 21 (L) 22 - 32 mmol/L   Glucose, Bld 114 (H) 70 - 99 mg/dL   BUN 36 (H) 8 - 23 mg/dL   Creatinine, Ser 1.61 (H) 0.61 - 1.24 mg/dL   Calcium 9.1 8.9 - 10.3 mg/dL   GFR calc non Af Amer 38 (L) >60 mL/min   GFR calc Af Amer 44 (L) >60 mL/min   Anion gap 10 5 - 15    Comment: Performed at [place] [address] [place], [place] [address]  CBC     Status: Abnormal   Collection Time: 06/15/18  1:57 AM  Result Value Ref Range   WBC 9.9 4.0 - 10.5 K/uL   RBC 3.37 (L) 4.22 - 5.81 MIL/uL   Hemoglobin 10.9 (L) 13.0 - 17.0 g/dL   HCT 32.3 (L) 39.0 - 52.0 %   MCV 95.8 80.0 - 100.0 fL   MCH 32.3 26.0 - 34.0 pg   MCHC 33.7 30.0 - 36.0 g/dL   RDW 13.1 11.5 - 15.5 %   Platelets 145 (L) 150 - 400 K/uL   nRBC 0.0 0.0 - 0.2 %    Comment: Performed at [facility], [place] [address], [place], [place] [address]  Glucose, capillary     Status: Abnormal   Collection Time: 06/15/18  8:28 AM  Result Value Ref Range   Glucose-Capillary 122 (H) 70 - 99 mg/dL  Glucose, capillary     Status: Abnormal   Collection Time: 06/15/18 12:05 PM  Result Value Ref Range   Glucose-Capillary 222 (H) 70 - 99 mg/dL   No results found.     Medical Problem List  and Plan: 1.  Functional decline secondary to Pelvic fracture and pain. 2.  DVT Prophylaxis/Anticoagulation: Pharmaceutical: Lovenox 3. Pain Management: Continue tylenol qid with ultram prn. Wife does not want patient on any narcotics.  4. H/o Anxiety disorder/Mood: Continue Zoloft daily. LCSW to follow for evaluation and support.  5. Neuropsych: This patient is capable of making decisions on his own behalf. 6. Skin/Wound Care: Routine pressure relief measures.  7. Fluids/Electrolytes/Nutrition: Monitor I/O. Check lytes in am.  8. T2DM: Will resume lower dose Amaryl. Monitor BS ac/hs and use SSI for elevated BS. Add bedtime snack 9. HTN: Monitor BP bid--continue Norvasc at bedtime and Cozaar in am.  10. Acute on chronic renal failure: Encourage fluid intake.  76.  H/o CVA: On Plavix and Lipitor.  12. ABLA/Thrombocytopenia: Will recheck CBC in am. Change heparin to Lovenox.     Post Admission Physician Evaluation: 1. Preadmission assessment reviewed and changes made below. 2. Functional deficits secondary  to pelvic fracture. 3. Patient is admitted to receive collaborative, interdisciplinary care between the physiatrist, rehab nursing staff, and therapy team. 4. Patient's level of medical complexity and substantial therapy needs in context of that medical necessity cannot be provided at a lesser intensity of care such as a SNF. 5. Patient has experienced substantial functional loss from his/her baseline which was documented above  under the "Functional History" and "Functional Status" headings.  Judging by the patient's diagnosis, physical exam, and functional history, the patient has potential for functional progress which will result in measurable gains while on inpatient rehab.  These gains will be of substantial and practical use upon discharge  in facilitating mobility and self-care at the household level. 6. Physiatrist will provide 24 hour management of medical needs as well as oversight  of the therapy plan/treatment and provide guidance as appropriate regarding the interaction of the two. 7. 24 hour rehab nursing will assist with bladder management, bowel management, safety, skin/wound care, disease management, medication administration, pain management and patient education  and help integrate therapy concepts, techniques,education, etc. 8. PT will assess and treat for/with: Lower extremity strength, range of motion, stamina, balance, functional mobility, safety, adaptive techniques and equipment, wound care, coping skills, pain control, education. Goals are: Min a. 9. OT will assess and treat for/with: ADL's, functional mobility, safety, upper extremity strength, adaptive techniques and equipment, wound mgt, ego support, and community reintegration.   Goals are: Min a. Therapy may proceed with showering this patient. 10. Case Management and Social Worker will assess and treat for psychological issues and discharge planning. 11. Team conference will be held weekly to assess progress toward goals and to determine barriers to discharge. 12. Patient will receive at least 3 hours of therapy per day at least 5 days per week. 13. ELOS: 14-18 days.       14. Prognosis:  good  I have personally performed a face to face diagnostic evaluation, including, but not limited to relevant history and physical exam findings, of this patient and developed relevant assessment and plan.  Additionally, I have reviewed and concur with the physician assistant's documentation above.  The patient's status has not changed. The original post admission physician evaluation remains appropriate, and any changes from the pre-admission screening or documentation from the acute chart are noted above.   Delice Lesch, MD, ABPMR Bary Leriche, PA-C 06/15/2018

## 2018-06-15 NOTE — Progress Notes (Signed)
PMR Admission Coordinator Pre-Admission Assessment  Patient: Tyler [name] is an 85 y.o., male MRN: [id number] DOB: 12-Jun-1928 Height: 5\' 11"  (180.3 cm) Weight: 74.8 kg                                                                                                                                                  Insurance Information HMO:     PPO: Yes     PCP:      IPA:      80/20:      OTHER:  PRIMARY: [employer]      Policy#: [id number]      Subscriber: Patient CM Name: Dani Gobble       Phone#: [phone]     Fax#:  Pre-Cert#: [id number]      Employer:  Josem Kaufmann provided by Benjamine Mola B for admit to CIR on 06/15/18. Pt is approved for 7 days. HTA has epic access so no need to fax clinical updates.  Benefits:  Phone #: NA     Name:[facility] [employer] Portal Eff. Date: 05/04/18     Deduct: NA      Out of Pocket Max: $3,400      Life Max: NA CIR: $295/day for days 1-6, $0/day for days 7+      SNF: $20/day for days 1-20, $160/day for days 21-100; 100 day limit Outpatient: per necessity    Co-Pay: $15/visit Home Health: 100%      Co-Pay:  DME: 80%     Co-Pay: 20% Providers:  SECONDARY:      Policy#:       Subscriber:  CM Name:       Phone#:      Fax#:  Pre-Cert#:       Employer:  Benefits:  Phone #:      Name:  Eff. Date:      Deduct:       Out of Pocket Max:       Life Max:  CIR:       SNF:  Outpatient:      Co-Pay:  Home Health:       Co-Pay:  DME:      Co-Pay:   Medicaid Application Date:       Case Manager:  Disability Application Date:       Case Worker:   Emergency Publishing copy Information    Name Relation Home Work [place] Spouse [phone]   No name specified         Current Medical History  Patient Admitting Diagnosis: pelvic fracture History of Present Illness: Pt is an 85 yo M with history of HTN, prior stroke, Type 2 DM, depression, and hyperlipidemia. Pt was admitted to the hospital with  right hip pain after a mechanical fall and was  found to have multiple nondisplaced fractures of his sacrum and pubic ramis. Per orthopedics, his fractures are to be managed conservatively without operation. Pt has been evaluated by therapies with recommendation for CIR. Pt is to be admitted to CIR on 06/15/18.   Past Medical History      Past Medical History:  Diagnosis Date  . Cancer [facility])    Prostate  . Diabetes mellitus without complication ([place])   . Hypertension   . Kidney stones   . Stroke [facility]     Family History  family history includes Emphysema in his father; Heart Problems in his mother.  Prior Rehab/Hospitalizations:  Has the patient had major surgery during 100 days prior to admission? No  Current Medications   Current Facility-Administered Medications:  .  acetaminophen (TYLENOL) tablet 650 mg, 650 mg, Oral, Q6H, Oretha Milch D, MD, 650 mg at 06/15/18 0946 .  amLODipine (NORVASC) tablet 2.5 mg, 2.5 mg, Oral, QHS, Ivor Costa, MD, 2.5 mg at 06/14/18 2148 .  atorvastatin (LIPITOR) tablet 20 mg, 20 mg, Oral, Daily, Ivor Costa, MD, 20 mg at 06/15/18 0946 .  cholecalciferol (VITAMIN D3) tablet 1,000 Units, 1,000 Units, Oral, QHS, Ivor Costa, MD, 1,000 Units at 06/14/18 2148 .  clopidogrel (PLAVIX) tablet 75 mg, 75 mg, Oral, Q breakfast, Ivor Costa, MD, 75 mg at 06/15/18 0946 .  docusate sodium (COLACE) capsule 100 mg, 100 mg, Oral, BID, Ivor Costa, MD, 100 mg at 06/15/18 0946 .  heparin injection 5,000 Units, 5,000 Units, Subcutaneous, Q8H, Ivor Costa, MD, 5,000 Units at 06/15/18 1334 .  hydrALAZINE (APRESOLINE) injection 5 mg, 5 mg, Intravenous, Q2H PRN, Ivor Costa, MD .  insulin aspart (novoLOG) injection 0-5 Units, 0-5 Units, Subcutaneous, QHS, Niu, Xilin, MD .  insulin aspart (novoLOG) injection 0-9 Units, 0-9 Units, Subcutaneous, TID WC, Ivor Costa, MD, 3 Units at 06/15/18 1254 .  losartan (COZAAR) tablet 50 mg, 50 mg, Oral, Daily, Ivor Costa, MD, 50 mg at  06/15/18 0946 .  Melatonin TABS 3 mg, 3 mg, Oral, QHS, Ivor Costa, MD, 3 mg at 06/14/18 2148 .  methocarbamol (ROBAXIN) tablet 500 mg, 500 mg, Oral, Q8H PRN, Ivor Costa, MD, 500 mg at 06/14/18 1225 .  morphine 2 MG/ML injection 0.5 mg, 0.5 mg, Intravenous, Q4H PRN, Ivor Costa, MD .  multivitamin with minerals tablet 1 tablet, 1 tablet, Oral, Daily, Oretha Milch D, MD, 1 tablet at 06/15/18 0946 .  polyethylene glycol (MIRALAX / GLYCOLAX) packet 17 g, 17 g, Oral, Daily PRN, Ivor Costa, MD, 17 g at 06/13/18 1025 .  sertraline (ZOLOFT) tablet 100 mg, 100 mg, Oral, Daily, Ivor Costa, MD, 100 mg at 06/15/18 0946 .  traMADol (ULTRAM) tablet 50 mg, 50 mg, Oral, Q6H PRN, Oretha Milch D, MD, 50 mg at 06/15/18 0528 .  vitamin B-12 (CYANOCOBALAMIN) tablet 1,000 mcg, 1,000 mcg, Oral, Daily, Ivor Costa, MD, 1,000 mcg at 06/15/18 8295  Patients Current Diet:     Diet Order                  Diet regular Room service appropriate? Yes; Fluid consistency: Thin  Diet effective now         Diet - low sodium heart healthy               Precautions / Restrictions Precautions Precautions: Fall Precaution Comments: extremely HOH Restrictions Weight Bearing Restrictions: Yes RLE Weight Bearing: Weight bearing as tolerated LLE Weight Bearing: Weight bearing as tolerated   Has the patient had  2 or more falls or a fall with injury in the past year?No, not until this admission.  Prior Activity Level Community (5-7x/wk): Pt is retired but driving and active PTA.   Home Assistive Devices / Equipment Home Equipment: Shower seat - built in, Grab bars - tub/shower, Engineer, manufacturing held shower head, Cane - quad(from prior chart)  Prior Device Use: Indicate devices/aids used by the patient prior to current illness, exacerbation or injury? quad cane  Prior Functional Level Prior Function Level of Independence: Independent with assistive device(s)  Self Care: Did the patient need help bathing,  dressing, using the toilet or eating?  Independent for bathing, toileting, feeding; some assist for buttoning and ties.   Indoor Mobility: Did the patient need assistance with walking from room to room (with or without device)? Independent  Stairs: Did the patient need assistance with internal or external stairs (with or without device)? Independent  Functional Cognition: Did the patient need help planning regular tasks such as shopping or remembering to take medications? Independent  Current Functional Level Cognition  Overall Cognitive Status: Within Functional Limits for tasks assessed General Comments: pt very motivated to get better and go home    Extremity Assessment (includes Sensation/Coordination)  Upper Extremity Assessment: Generalized weakness  Lower Extremity Assessment: Generalized weakness, Defer to PT evaluation RLE Deficits / Details: pt initiating hip/knee flexion in supine, able to complete quad set, glut set and ankle pumps RLE Sensation: WNL LLE Deficits / Details: pt with full active hip/knee flexion in bed, able to complete quad and glut set as well as ankle pumps LLE Sensation: WNL    ADLs  Overall ADL's : Needs assistance/impaired Eating/Feeding: Set up, Sitting(could not cut up own meal) Grooming: Set up, Sitting Upper Body Bathing: Set up, Sitting Lower Body Bathing: Moderate assistance Lower Body Bathing Details (indicate cue type and reason): Mod A sit<>stand and maintain due to tendency for posterior lean Upper Body Dressing : Set up, Sitting Lower Body Dressing: Moderate assistance Lower Body Dressing Details (indicate cue type and reason): Mod A sit<>stand and maintain due to tendency for posterior lean Toilet Transfer: Moderate assistance, Ambulation, +2 for safety/equipment, RW Toilet Transfer Details (indicate cue type and reason): bed>around to recliner Toileting- Clothing Manipulation and Hygiene: Moderate assistance Toileting -  Clothing Manipulation Details (indicate cue type and reason): Mod A sit<>stand and maintain due to tendency for posterior lean  Functional mobility during ADLs: Maximal assistance, Rolling walker(MaxA +1 for transfer from bed- elevated to recliner ) General ADL Comments: set-upA for UB  to maxA for LB ADL due to pain    Mobility  Overal bed mobility: Needs Assistance Bed Mobility: Sit to Supine Supine to sit: Mod assist, HOB elevated Sit to supine: Max assist, HOB elevated General bed mobility comments: maxA for scooting hips to EOB with HOB elevated and using pad to move hips to EOB. Pt able to sit upright for mobility.    Transfers  Overall transfer level: Needs assistance Equipment used: Rolling walker (2 wheeled) Transfers: Sit to/from Stand Sit to Stand: Max assist, From elevated surface General transfer comment: maxA for power up; maximal verbal cues for hand placement and sequencing to recliner.    Ambulation / Gait / Stairs / Wheelchair Mobility  Ambulation/Gait Ambulation/Gait assistance: Mod assist, +2 safety/equipment Gait Distance (Feet): 3 Feet Assistive device: Rolling walker (2 wheeled) Gait Pattern/deviations: Step-to pattern, Decreased stride length, Decreased stance time - right, Antalgic General Gait Details: antalgic, limited R LE WBing tolerance due to pain, increased  bilat UE support, verbal cues for stepping sequence, increased time, increased R Hip pain Gait velocity: slow Gait velocity interpretation: <1.31 ft/sec, indicative of household ambulator    Posture / Balance Balance Overall balance assessment: Needs assistance Sitting-balance support: Feet supported Sitting balance-Leahy Scale: Fair Postural control: Posterior lean Standing balance support: Bilateral upper extremity supported Standing balance-Leahy Scale: Poor Standing balance comment: dependent on RW; posterior lean requiring assist to stand upright    Special needs/care consideration  BiPAP/CPAP: no CPM: no Continuous Drip IV: no Dialysis: no        Days: no Life Vest: no Oxygen: no Special Bed: no Trach Size: no Wound Vac (area): no   Location: NA Skin: ecchymosis on right hip and abdomen, skin tear on right elbow Bowel mgmt: continent, 06/14/18 Bladder mgmt:exteneral catheter in place Diabetic mgmt: Yes     Previous Home Environment Living Arrangements: Spouse/significant other Available Help at Discharge: Family, Available 24 hours/day Type of Home: House Home Layout: Two level Alternate Level Stairs-Rails: Right(from prior chart) Alternate Level Stairs-Number of Steps: 3 Home Access: Level entry Bathroom Shower/Tub: Multimedia programmer: Handicapped height  Discharge Living Setting Plans for Discharge Living Setting: Patient's home, Lives with (comment)(wife) Type of Home at Discharge: House Discharge Home Layout: One level Discharge Home Access: Level entry Discharge Bathroom Shower/Tub: Walk-in shower Discharge Bathroom Toilet: Handicapped height Discharge Bathroom Accessibility: Yes How Accessible: Accessible via walker Does the patient have any problems obtaining your medications?: No  Social/Family/Support Systems Patient Roles: Spouse Contact Information: wife: [name]: [phone] (mobile); home: [phone] (home); [phone] (work) Anticipated Caregiver: wife Anticipated Ambulance person Information: see above Ability/Limitations of Caregiver: Min A Caregiver Availability: 24/7 Discharge Plan Discussed with Primary Caregiver: Yes Is Caregiver In Agreement with Plan?: Yes Does Caregiver/Family have Issues with Lodging/Transportation while Pt is in Rehab?: No   Goals/Additional Needs Patient/Family Goal for Rehab: PT/OT: Supervision/Min A; SLP: NA Expected length of stay: 17-21 days Cultural Considerations: [employer] Dietary Needs: Regular diet, thin liquids Equipment Needs: NA Pt/Family Agrees to Admission  and willing to participate: Yes Program Orientation Provided & Reviewed with Pt/Caregiver Including Roles  & Responsibilities: Yes(pt and wife)  Barriers to Discharge: Other (comments)(none anticipated)   Decrease burden of Care through IP rehab admission: NA   Possible need for SNF placement upon discharge: Potentially, however hopefully not as pt has good social support at home. The pt's wife is able to provide the recommended assist level at DC.    Patient Condition: This patient's medical and functional status has changed since the consult dated 06/13/18 in which the Rehabilitation Physician determined and documented that the patient was potentially appropriate for intensive rehabilitative care in an inpatient rehabilitation facility. Issues have been addressed and update has been discussed with Dr. Posey Pronto and patient now appropriate for inpatient rehabilitation. Pt has the recommended assist level at DC after explanation of what Min A might entail. Will admit to inpatient rehab today.   Preadmission Screen Completed By:  Jhonnie Garner, 06/15/2018 3:28 PM ______________________________________________________________________   Discussed status with Dr. Posey Pronto on 06/15/18 at 3:28PM and received telephone approval for admission today.  Admission Coordinator:  Jhonnie Garner, time 3:28PM Sudie Grumbling 06/15/18       Revision History    Date/Time User Provider Type Action  06/15/2018 3:50 PM [name], MD Physician Addend  06/15/2018 3:50 PM [name], MD Physician Cosign  06/15/2018 3:30 PM Jhonnie Garner, [facility] Admission Coordinator Sign  View Details Report

## 2018-06-15 NOTE — Progress Notes (Signed)
Tyler Arn, MD  Physician  Physical Medicine and Rehabilitation  Consult Note  Signed  Date of Service:  06/13/2018 11:29 AM       Related encounter: ED to Hosp-Admission (Discharged) from 06/12/2018 in [facility] [facility]         Physical Medicine and Rehabilitation Consult   Reason for Consult:  Functional deficits due to sacral fracture Referring Physician: Dr. Lonny Prude   HPI: Tyler [name] is a 85 y.o. male with history of T2DM, prostate cancer, HTN, CVA/TIA; who was admitted on 06/12/18 with fall and onset of right hip/groin pain. History taken from chart review and wife.  CT pelvis revealed nondisplaced fracture of right sacral ala and superior and inferior pubic rami. Dr. Doran Durand consulted for input and recommended  WBAT and follow up on outpatient basis. Therapy evaluations completed today revealing significant deficits in mobility and selfcare. CIR recommended due to functional decline. Ambulated NT to BR but noted to be impulsive with poor safety awareness.  Hospital course complicated by pain, thrombocytopenia.  Patient required assistance with all ADLs at baseline.  Wife able to provide supervision at discharge.  Review of Systems  Constitutional: Negative for chills and fever.  HENT: Positive for hearing loss.   Eyes: Negative for blurred vision and double vision.  Respiratory: Negative for cough and shortness of breath.   Cardiovascular: Negative for chest pain and palpitations.  Gastrointestinal: Negative for abdominal pain, heartburn and nausea.  Genitourinary: Positive for frequency.  Musculoskeletal: Positive for joint pain and myalgias.  Skin: Negative for rash.  Neurological: Positive for dizziness (this am due to narcotics/) and focal weakness (RLE due to prior stroke/fracture).  All other systems reviewed and are negative.      Past Medical History:  Diagnosis Date  .  Cancer [facility])    Prostate  . Diabetes mellitus without complication ([place])   . Hypertension   . Kidney stones   . Stroke [facility])          Past Surgical History:  Procedure Laterality Date  . CARDIAC CATHETERIZATION    . FEMUR IM NAIL Right 11/17/2015   Procedure: INTRAMEDULLARY (IM) NAIL FEMORAL;  Surgeon: Marybelle Killings, MD;  Location: [place];  Service: Orthopedics;  Laterality: Right;  . PROSTATE SURGERY           Family History  Problem Relation Age of Onset  . Emphysema Father   . Heart Problems Mother     Social History:  Married. Lives with wife. Independent with [employer] P[employer]. He reports that he quit smoking about 50 years ago. He has never used smokeless tobacco. He reports that he drinks a glass of wine couple of times a year. He does not use drugs.         Allergies  Allergen Reactions  . Sulfa Drugs Cross Reactors Anaphylaxis and Rash    Lungs fill with fluid         Medications Prior to Admission  Medication Sig Dispense Refill  . amLODipine (NORVASC) 2.5 MG tablet Take 2.5 mg by mouth at bedtime.     [name] atorvastatin (LIPITOR) 20 MG tablet Take 20 mg by mouth daily.     . cholecalciferol (VITAMIN D) 1000 UNITS tablet Take 1,000 Units by mouth at bedtime.     . clopidogrel (PLAVIX) 75 MG tablet Take 1 tablet (75 mg total) by mouth daily with  breakfast. 30 tablet 6  . Flaxseed, Linseed, (FLAXSEED OIL) 1000 MG CAPS Take 1,000 mg by mouth at bedtime.     [name] glimepiride (AMARYL) 2 MG tablet Take 2 mg by mouth daily.    [name] losartan (COZAAR) 50 MG tablet Take 50 mg by mouth daily.     [name] MELATONIN PO Take 1 tablet by mouth at bedtime.    . sertraline (ZOLOFT) 50 MG tablet Take 100 mg by mouth daily.    . vitamin B-12 (CYANOCOBALAMIN) 1000 MCG tablet Take 1,000 mcg by mouth daily.    [name] docusate sodium (COLACE) 100 MG capsule Take 1 capsule (100 mg total) by mouth 2 (two) times daily. (Patient not taking: Reported on 06/12/2018) 10 capsule 0  .  Lancets (ONETOUCH ULTRASOFT) lancets     . ONE TOUCH ULTRA TEST test strip     . pantoprazole (PROTONIX) 40 MG tablet Take 1 tablet (40 mg total) by mouth daily. (Patient not taking: Reported on 06/12/2018) 30 tablet 1    Home: [place] expects to be discharged to:: Inpatient rehab Living Arrangements: Spouse/significant other Available Help at Discharge: Family, Available 24 hours/day Type of Home: House Home Access: Level entry Home Layout: Two level Alternate Level Stairs-Number of Steps: 3 Alternate Level Stairs-Rails: Right(from prior chart) Bathroom Shower/Tub: Multimedia programmer: Handicapped height Home Equipment: Civil engineer, contracting - built in, [employer] - tub/shower, Engineer, manufacturing held shower head, Cane - quad(from prior chart)  Functional History: Prior Function Level of Independence: Independent with assistive device(s) Functional Status:  Mobility: Bed Mobility Overal bed mobility: Needs Assistance Bed Mobility: Supine to Sit Supine to sit: Mod assist, HOB elevated General bed mobility comments: increased time and cues Transfers Overall transfer level: Needs assistance Equipment used: Rolling walker (2 wheeled) Transfers: Sit to/from Stand Sit to Stand: Mod assist, +2 safety/equipment General transfer comment: verbal cues to push up from bed, modA to power up and steady during transition of hands, mild posterior lean, corrected with v/c's Ambulation/Gait Ambulation/Gait assistance: Mod assist, +2 safety/equipment Gait Distance (Feet): 12 Feet Assistive device: Rolling walker (2 wheeled) Gait Pattern/deviations: Step-to pattern, Decreased stride length, Decreased stance time - right, Antalgic General Gait Details: antalgic, limited R LE WBing tolerance due to pain, increased bilat UE support, verbal cues for stepping sequence, increased time, increased R Hip pain Gait velocity: slow  ADL: ADL Overall ADL's : Needs  assistance/impaired Eating/Feeding: Independent, Sitting Grooming: Set up, Sitting Upper Body Bathing: Set up, Sitting Lower Body Bathing: Moderate assistance Lower Body Bathing Details (indicate cue type and reason): Mod A sit<>stand and maintain due to tendency for posterior lean Upper Body Dressing : Set up, Sitting Lower Body Dressing: Moderate assistance Lower Body Dressing Details (indicate cue type and reason): Mod A sit<>stand and maintain due to tendency for posterior lean Toilet Transfer: Moderate assistance, Ambulation, +2 for safety/equipment, RW Toilet Transfer Details (indicate cue type and reason): bed>around to recliner Toileting- Clothing Manipulation and Hygiene: Moderate assistance Toileting - Clothing Manipulation Details (indicate cue type and reason): Mod A sit<>stand and maintain due to tendency for posterior lean   Cognition: Cognition Overall Cognitive Status: Within Functional Limits for tasks assessed Cognition Arousal/Alertness: Awake/alert Behavior During Therapy: WFL for tasks assessed/performed Overall Cognitive Status: Within Functional Limits for tasks assessed General Comments: pt very motivated to get better and go home  Blood pressure (!) 148/70, pulse 70, temperature (!) 97.1 F (36.2 C), temperature source Oral, resp. rate 18, height 5\' 11"  (1.803 m), weight 74.8  kg, SpO2 97 %. Physical Exam  Nursing note and vitals reviewed. Constitutional: He is oriented to person, place, and time. He appears well-developed and well-nourished.  HENT:  Head: Normocephalic and atraumatic.  Eyes: EOM are normal. Right eye exhibits no discharge. Left eye exhibits no discharge.  Neck: Normal range of motion. Neck supple.  Cardiovascular: Normal rate and regular rhythm.  Respiratory: Effort normal and breath sounds normal.  GI: Soft. Bowel sounds are normal.  Musculoskeletal:     Comments: Right hip tenderness  Neurological: He is alert and oriented to person,  place, and time.  Right facial weakness.  Speech clear.  Able to follow basic command without difficulty.  Very HOH Motor: Bilateral upper extremities: 4+-5/5 proximal distal Left lower extremity: Hip flexion 4/5, knee extension 4-4+/5, ankle dorsiflexion 5/5 Right lower extremity: Hip flexion 3/5, knee extension 4/5, ankle dorsiflexion 5/5  Skin: Skin is warm and dry.  Scattered hematomas  Psychiatric: He has a normal mood and affect. His behavior is normal.    LabResultsLast24Hours       Results for orders placed or performed during the hospital encounter of 06/12/18 (from the past 24 hour(s))  Basic metabolic panel     Status: Abnormal   Collection Time: 06/12/18  5:44 PM  Result Value Ref Range   Sodium 140 135 - 145 mmol/L   Potassium 4.0 3.5 - 5.1 mmol/L   Chloride 111 98 - 111 mmol/L   CO2 20 (L) 22 - 32 mmol/L   Glucose, Bld 79 70 - 99 mg/dL   BUN 28 (H) 8 - 23 mg/dL   Creatinine, Ser 1.44 (H) 0.61 - 1.24 mg/dL   Calcium 9.3 8.9 - 10.3 mg/dL   GFR calc non Af Amer 43 (L) >60 mL/min   GFR calc Af Amer 50 (L) >60 mL/min   Anion gap 9 5 - 15  CBC with Differential     Status: Abnormal   Collection Time: 06/12/18  5:44 PM  Result Value Ref Range   WBC 12.9 (H) 4.0 - 10.5 K/uL   RBC 3.71 (L) 4.22 - 5.81 MIL/uL   Hemoglobin 11.9 (L) 13.0 - 17.0 g/dL   HCT 35.8 (L) 39.0 - 52.0 %   MCV 96.5 80.0 - 100.0 fL   MCH 32.1 26.0 - 34.0 pg   MCHC 33.2 30.0 - 36.0 g/dL   RDW 12.9 11.5 - 15.5 %   Platelets 154 150 - 400 K/uL   nRBC 0.0 0.0 - 0.2 %   Neutrophils Relative % 86 %   Neutro Abs 11.1 (H) 1.7 - 7.7 K/uL   Lymphocytes Relative 7 %   Lymphs Abs 0.9 0.7 - 4.0 K/uL   Monocytes Relative 6 %   Monocytes Absolute 0.8 0.1 - 1.0 K/uL   Eosinophils Relative 0 %   Eosinophils Absolute 0.0 0.0 - 0.5 K/uL   Basophils Relative 0 %   Basophils Absolute 0.0 0.0 - 0.1 K/uL   Immature Granulocytes 1 %   Abs Immature Granulocytes 0.09 (H)  0.00 - 0.07 K/uL  Urinalysis, Routine w reflex microscopic     Status: Abnormal   Collection Time: 06/12/18  7:02 PM  Result Value Ref Range   Color, Urine YELLOW YELLOW   APPearance CLEAR CLEAR   Specific Gravity, Urine 1.014 1.005 - 1.030   pH 5.0 5.0 - 8.0   Glucose, UA NEGATIVE NEGATIVE mg/dL   Hgb urine dipstick SMALL (A) NEGATIVE   Bilirubin Urine NEGATIVE NEGATIVE   Ketones, ur  5 (A) NEGATIVE mg/dL   Protein, ur NEGATIVE NEGATIVE mg/dL   Nitrite NEGATIVE NEGATIVE   Leukocytes, UA NEGATIVE NEGATIVE   RBC / HPF 6-10 0 - 5 RBC/hpf   WBC, UA 0-5 0 - 5 WBC/hpf   Bacteria, UA RARE (A) NONE SEEN   Mucus PRESENT   Glucose, capillary     Status: Abnormal   Collection Time: 06/12/18  8:47 PM  Result Value Ref Range   Glucose-Capillary 200 (H) 70 - 99 mg/dL  CBC     Status: Abnormal   Collection Time: 06/13/18  2:24 AM  Result Value Ref Range   WBC 9.4 4.0 - 10.5 K/uL   RBC 3.60 (L) 4.22 - 5.81 MIL/uL   Hemoglobin 11.1 (L) 13.0 - 17.0 g/dL   HCT 34.1 (L) 39.0 - 52.0 %   MCV 94.7 80.0 - 100.0 fL   MCH 30.8 26.0 - 34.0 pg   MCHC 32.6 30.0 - 36.0 g/dL   RDW 12.9 11.5 - 15.5 %   Platelets 141 (L) 150 - 400 K/uL   nRBC 0.0 0.0 - 0.2 %  Basic metabolic panel     Status: Abnormal   Collection Time: 06/13/18  2:24 AM  Result Value Ref Range   Sodium 138 135 - 145 mmol/L   Potassium 4.2 3.5 - 5.1 mmol/L   Chloride 107 98 - 111 mmol/L   CO2 21 (L) 22 - 32 mmol/L   Glucose, Bld 221 (H) 70 - 99 mg/dL   BUN 27 (H) 8 - 23 mg/dL   Creatinine, Ser 1.55 (H) 0.61 - 1.24 mg/dL   Calcium 9.0 8.9 - 10.3 mg/dL   GFR calc non Af Amer 40 (L) >60 mL/min   GFR calc Af Amer 46 (L) >60 mL/min   Anion gap 10 5 - 15  Protime-INR     Status: None   Collection Time: 06/13/18  2:24 AM  Result Value Ref Range   Prothrombin Time 14.8 11.4 - 15.2 seconds   INR 1.17   APTT     Status: None   Collection Time: 06/13/18  2:24 AM  Result Value Ref Range    aPTT 34 24 - 36 seconds  Glucose, capillary     Status: Abnormal   Collection Time: 06/13/18  8:06 AM  Result Value Ref Range   Glucose-Capillary 113 (H) 70 - 99 mg/dL   Comment 1 Notify RN    Comment 2 Document in Chart       ImagingResults(Last48hours)  Ct Hip Right Wo Contrast  Result Date: 06/12/2018 CLINICAL DATA:  Right [place] pain [place]. EXAM: CT OF THE RIGHT HIP WITHOUT CONTRAST TECHNIQUE: Multidetector CT imaging of the right hip was performed according to the standard protocol. Multiplanar CT image reconstructions were also generated. COMPARISON:  Radiographs from 06/12/2018 FINDINGS: Bones/Joint/Cartilage Nondisplaced fracture of the right sacral ala, image 34/11. Nondisplaced fracture of the right superior pubic ramus, image 75/11. Nondisplaced fracture of the right inferior pubic ramus, image 100/11. Right hip screw with IM rod noted. No femur fracture is observed in the visualized part of the femur, we included down to the distal metaphysis but not quite all the way through the stem of the implant. There is a single distal interlocking screw noted. Spurring along the lesser trochanter. Degenerative loss of intervertebral disc height at L4-5. Ligaments Suboptimally assessed by CT. Muscles and Tendons Unremarkable Soft tissues Aortoiliac atherosclerotic vascular disease. Prior lymph node dissection in the right hemipelvis. Suspected prostatectomy. IMPRESSION:  1. Acute nondisplaced fractures of the right sacral ala, right superior pubic ramus, and right inferior pubic ramus. 2. Degenerative loss of intervertebral disc height at L4-5. 3.  Aortic Atherosclerosis (ICD10-I70.0). Electronically Signed   By: Van Clines M.D.   On: 06/12/2018 16:24   Dg Hip Unilat W Or Wo Pelvis 2-3 Views Right  Result Date: 06/12/2018 CLINICAL DATA:  Patient status post fall.  Right leg pain. EXAM: DG HIP (WITH OR WITHOUT PELVIS) 2-3V RIGHT COMPARISON:  None. FINDINGS:  Intramedullary rod within the right femur. No evidence for acute displaced femur fracture. Well corticated ossific density adjacent to the lesser tuberosity. Regional soft tissues are unremarkable. Hardware appears intact. Pelvic surgical clips. Lumbar spine degenerative changes. SI joints unremarkable. Stool throughout the colon. IMPRESSION: No evidence for acute displaced fracture. Well corticated ossific density adjacent to the lesser trochanter, potentially sequelae of prior trauma. Recommend correlation for point tenderness to exclude the possibility of avulsion injury. Electronically Signed   By: Lovey Newcomer M.D.   On: 06/12/2018 14:24     Assessment/Plan: Diagnosis: Pelvic fracture Labs independently reviewed.  Records reviewed and summated above.  1. Does the need for close, 24 hr/day medical supervision in concert with the patient's rehab needs make it unreasonable for this patient to be served in a less intensive setting? Yes  2. Co-Morbidities requiring supervision/potential complications: pain (Biofeedback training with therapies to help reduce reliance on opiate pain medications, monitor pain control during therapies, and sedation at rest and titrate to maximum efficacy to ensure participation and gains in therapies), thrombocytopenia, T2 DM (Monitor in accordance with exercise and adjust meds as necessary), prostate cancer, HTN (monitor and provide prns in accordance with increased physical exertion and pain), CVA/TIA, anemia of chronic disease (repeat labs, transfuse to ensure appropriate perfusion for increased activity tolerance), CKD (avoid nephrotoxic meds) 3. Due to bladder management, safety, skin/wound care, disease management, medication administration, pain management and patient education, does the patient require 24 hr/day rehab nursing? Yes 4. Does the patient require coordinated care of a physician, rehab nurse, PT (1-2 hrs/day, 5 days/week) and OT (1-2 hrs/day, 5 days/week)  to address physical and functional deficits in the context of the above medical diagnosis(es)? Yes Addressing deficits in the following areas: balance, endurance, locomotion, strength, transferring, bowel/bladder control, bathing, dressing, toileting and psychosocial support 5. Can the patient actively participate in an intensive therapy program of at least 3 hrs of therapy per day at least 5 days per week? Potentially 6. The potential for patient to make measurable gains while on inpatient rehab is excellent 7. Anticipated functional outcomes upon discharge from inpatient rehab are supervision and min assist  with PT, supervision and min assist with OT, n/a with SLP. 8. Estimated rehab length of stay to reach the above functional goals is: 17-21 days. 9. Anticipated D/C setting: Home 10. Anticipated post D/C treatments: HH therapy and Home excercise program 11. Overall Rehab/Functional Prognosis: good  RECOMMENDATIONS: This patient's condition is appropriate for continued rehabilitative care in the following setting: CIR if wife able to provide necessary assistance at discharge. Patient has agreed to participate in recommended program. Yes Note that insurance prior authorization may be required for reimbursement for recommended care.  Comment: Rehab Admissions Coordinator to follow up.   I have personally performed a face to face diagnostic evaluation, including, but not limited to relevant history and physical exam findings, of this patient and developed relevant assessment and plan.  Additionally, I have reviewed and concur with the  physician assistant's documentation above.   Delice Lesch, MD, ABPMR Bary Leriche, PA-C 06/13/2018        Revision History                                  Routing History

## 2018-06-15 NOTE — Discharge Summary (Signed)
Physician Discharge Summary  Tyler [name] QJJ:[id number] DOB: [birth year] DOA: 06/12/2018  PCP: [name], MD  Admit date: 06/12/2018 Discharge date: 06/15/2018  Admitted From: Home Disposition:  CIR Discharge Condition:Stable CODE STATUS:FULL Diet recommendation: Heart healthy  Brief/Interim Summary: Patient is 85 year old male with past medical history of a stroke, hypertension, CKD stage III, type 2 diabetes mellitus who presents from a fall in the charge.  Patient was found to have multiple pelvic fractures.  Seen by orthopedics and not recommended surgery.  He is being discharged to CIR today.  Following problems were addressed during his hospitalization:  Multiple pelvic fractures: Mechanical fall at church.  Orthopedics was following.  Advised against surgery.  Continue pain management.  Patient looks comfortable. Physical therapy recommended CIR on discharge.  Follow up with orthopedics on discharge.  History of thrombotic stroke: Continue Plavix.  History of hyperlipidemia: Continue Lipitor  History of hypertension: Currently blood pressure is well controlled.  Continue current meds  Type 2 diabetes mellitus: Continue current insulin regimen  Depression: Continue home medication.  Zoloft.  Chronic normocytic anemia: Currently stable  CKD stage III: Baseline creatinine ranged from 1.4-1.6.  Currently kidney function is on baseline    Discharge Diagnoses:  Principal Problem:   Closed fracture of pubic ramus, right, initial encounter ([place]) Active Problems:   History of stroke   Hyperlipidemia   Essential hypertension   CKD (chronic kidney disease), stage III (HCC)   Type II diabetes mellitus with renal manifestations (HCC)   Fall   Leukocytosis   Pain in joint involving right pelvic region and thigh   Thrombocytopenia (HCC)   Diabetes mellitus type 2 in obese (HCC)   Anemia of chronic disease    Discharge Instructions  Discharge Instructions    Diet - low sodium heart healthy   Complete by:  As directed    Discharge instructions   Complete by:  As directed    Follow up with Orthopedics as an outpatient.   Increase activity slowly   Complete by:  As directed      Allergies as of 06/15/2018      Reactions   Sulfa Drugs Cross Reactors Anaphylaxis, Rash   Lungs fill with fluid      Medication List    TAKE these medications   amLODipine 2.5 MG tablet Commonly known as:  NORVASC Take 2.5 mg by mouth at bedtime.   atorvastatin 20 MG tablet Commonly known as:  LIPITOR Take 20 mg by mouth daily.   cholecalciferol 1000 units tablet Commonly known as:  VITAMIN D Take 1,000 Units by mouth at bedtime.   clopidogrel 75 MG tablet Commonly known as:  PLAVIX Take 1 tablet (75 mg total) by mouth daily with breakfast.   docusate sodium 100 MG capsule Commonly known as:  COLACE Take 1 capsule (100 mg total) by mouth 2 (two) times daily.   Flaxseed Oil 1000 MG Caps Take 1,000 mg by mouth at bedtime.   glimepiride 2 MG tablet Commonly known as:  AMARYL Take 2 mg by mouth daily.   HYDROcodone-acetaminophen 5-325 MG tablet Commonly known as:  NORCO/VICODIN Take 1 tablet by mouth every 4 (four) hours as needed for up to 5 days for moderate pain or severe pain.   losartan 50 MG tablet Commonly known as:  COZAAR Take 50 mg by mouth daily.   MELATONIN PO Take 1 tablet by mouth at bedtime.   ONE TOUCH ULTRA TEST test strip Generic drug:  glucose blood   onetouch  ultrasoft lancets   pantoprazole 40 MG tablet Commonly known as:  PROTONIX Take 1 tablet (40 mg total) by mouth daily.   senna 8.6 MG Tabs tablet Commonly known as:  SENOKOT Take 2 tablets (17.2 mg total) by mouth 2 (two) times daily.   sertraline 50 MG tablet Commonly known as:  ZOLOFT Take 100 mg by mouth daily.   vitamin B-12 1000 MCG tablet Commonly known as:  CYANOCOBALAMIN Take 1,000 mcg by mouth daily.      Follow-up Information    [name], MD. Schedule an appointment as soon as possible for a visit in 3 week(s).   Specialty:  Orthopedic Surgery Contact information: [address] [place] [address] [place] [address] [phone]          Allergies  Allergen Reactions  . Sulfa Drugs Cross Reactors Anaphylaxis and Rash    Lungs fill with fluid    Consultations:  Orthopedics   Procedures/Studies: Ct Hip Right Wo Contrast  Result Date: 06/12/2018 CLINICAL DATA:  Right hip pain after fall at church. EXAM: CT OF THE RIGHT HIP WITHOUT CONTRAST TECHNIQUE: Multidetector CT imaging of the right hip was performed according to the standard protocol. Multiplanar CT image reconstructions were also generated. COMPARISON:  Radiographs from 06/12/2018 FINDINGS: Bones/Joint/Cartilage Nondisplaced fracture of the right sacral ala, image 34/11. Nondisplaced fracture of the right superior pubic ramus, image 75/11. Nondisplaced fracture of the right inferior pubic ramus, image 100/11. Right hip screw with IM rod noted. No femur fracture is observed in the visualized part of the femur, we included down to the distal metaphysis but not quite all the way through the stem of the implant. There is a single distal interlocking screw noted. Spurring along the lesser trochanter. Degenerative loss of intervertebral disc height at L4-5. Ligaments Suboptimally assessed by CT. Muscles and Tendons Unremarkable Soft tissues Aortoiliac atherosclerotic vascular disease. Prior lymph node dissection in the right hemipelvis. Suspected prostatectomy. IMPRESSION: 1. Acute nondisplaced fractures of the right sacral ala, right superior pubic ramus, and right inferior pubic ramus. 2. Degenerative loss of intervertebral disc height at L4-5. 3.  Aortic Atherosclerosis (ICD10-I70.0). Electronically Signed   By: Van Clines M.D.   On: 06/12/2018 16:24   Dg Hip Unilat W Or Wo Pelvis 2-3 Views Right  Result Date: 06/12/2018 CLINICAL DATA:  Patient status post  fall.  Right leg pain. EXAM: DG HIP (WITH OR WITHOUT PELVIS) 2-3V RIGHT COMPARISON:  None. FINDINGS: Intramedullary rod within the right femur. No evidence for acute displaced femur fracture. Well corticated ossific density adjacent to the lesser tuberosity. Regional soft tissues are unremarkable. Hardware appears intact. Pelvic surgical clips. Lumbar spine degenerative changes. SI joints unremarkable. Stool throughout the colon. IMPRESSION: No evidence for acute displaced fracture. Well corticated ossific density adjacent to the lesser trochanter, potentially sequelae of prior trauma. Recommend correlation for point tenderness to exclude the possibility of avulsion injury. Electronically Signed   By: Lovey Newcomer M.D.   On: 06/12/2018 14:24       Subjective: Patient seen and examined the bedside this morning.  Remains comfortable.  Hemodynamically stable for discharge.  Discharge Exam: Vitals:   06/15/18 0516 06/15/18 1303  BP: (!) 145/71 (!) 142/56  Pulse: 66 71  Resp: 20 17  Temp: 97.8 F (36.6 C) 98.7 F (37.1 C)  SpO2: 97% 96%   Vitals:   06/14/18 1705 06/14/18 2136 06/15/18 0516 06/15/18 1303  BP: 136/65 (!) 128/56 (!) 145/71 (!) 142/56  Pulse: 65 70 66 71  Resp:  18 20 17   Temp: 97.9 F (36.6 C) 98.4 F (36.9 C) 97.8 F (36.6 C) 98.7 F (37.1 C)  TempSrc: Oral Oral Oral   SpO2: 98% 96% 97% 96%  Weight:      Height:        General: Pt is alert, awake, not in acute distress Cardiovascular: RRR, S1/S2 +, no rubs, no gallops Respiratory: CTA bilaterally, no wheezing, no rhonchi Abdominal: Soft, NT, ND, bowel sounds + Extremities: no edema, no cyanosis    The results of significant diagnostics from this hospitalization (including imaging, microbiology, ancillary and laboratory) are listed below for reference.     Microbiology: No results found for this or any previous visit (from the past 240 hour(s)).   Labs: BNP (last 3 results) No results for input(s): BNP in  the last 8760 hours. Basic Metabolic Panel: Recent Labs  Lab 06/12/18 1744 06/13/18 0224 06/14/18 0350 06/15/18 0157  NA 140 138 139 138  K 4.0 4.2 4.2 4.1  CL 111 107 109 107  CO2 20* 21* 21* 21*  GLUCOSE 79 221* 143* 114*  BUN 28* 27* 30* 36*  CREATININE 1.44* 1.55* 1.65* 1.61*  CALCIUM 9.3 9.0 9.0 9.1   Liver Function Tests: No results for input(s): AST, ALT, ALKPHOS, BILITOT, PROT, ALBUMIN in the last 168 hours. No results for input(s): LIPASE, AMYLASE in the last 168 hours. No results for input(s): AMMONIA in the last 168 hours. CBC: Recent Labs  Lab 06/12/18 1744 06/13/18 0224 06/14/18 0350 06/15/18 0157  WBC 12.9* 9.4 9.9 9.9  NEUTROABS 11.1*  --   --   --   HGB 11.9* 11.1* 11.1* 10.9*  HCT 35.8* 34.1* 32.9* 32.3*  MCV 96.5 94.7 95.1 95.8  PLT 154 141* 146* 145*   Cardiac Enzymes: No results for input(s): CKTOTAL, CKMB, CKMBINDEX, TROPONINI in the last 168 hours. BNP: Invalid input(s): POCBNP CBG: Recent Labs  Lab 06/14/18 1230 06/14/18 1728 06/14/18 2135 06/15/18 0828 06/15/18 1205  GLUCAP 155* 94 181* 122* 222*   D-Dimer No results for input(s): DDIMER in the last 72 hours. Hgb A1c No results for input(s): HGBA1C in the last 72 hours. Lipid Profile No results for input(s): CHOL, HDL, LDLCALC, TRIG, CHOLHDL, LDLDIRECT in the last 72 hours. Thyroid function studies No results for input(s): TSH, T4TOTAL, T3FREE, THYROIDAB in the last 72 hours.  Invalid input(s): FREET3 Anemia work up No results for input(s): VITAMINB12, FOLATE, FERRITIN, TIBC, IRON, RETICCTPCT in the last 72 hours. Urinalysis    Component Value Date/Time   COLORURINE YELLOW 06/12/2018 1902   APPEARANCEUR CLEAR 06/12/2018 1902   LABSPEC 1.014 06/12/2018 1902   PHURINE 5.0 06/12/2018 1902   GLUCOSEU NEGATIVE 06/12/2018 1902   HGBUR SMALL (A) 06/12/2018 1902   BILIRUBINUR NEGATIVE 06/12/2018 1902   KETONESUR 5 (A) 06/12/2018 1902   PROTEINUR NEGATIVE 06/12/2018 1902    UROBILINOGEN 1.0 03/13/2008 1130   NITRITE NEGATIVE 06/12/2018 1902   LEUKOCYTESUR NEGATIVE 06/12/2018 1902   Sepsis Labs Invalid input(s): PROCALCITONIN,  WBC,  LACTICIDVEN Microbiology No results found for this or any previous visit (from the past 240 hour(s)).  Please note: You were cared for by a hospitalist during your hospital stay. Once you are discharged, your primary care physician will handle any further medical issues. Please note that NO REFILLS for any discharge medications will be authorized once you are discharged, as it is imperative that you return to your primary care physician (or establish a relationship with a primary care physician  if you do not have one) for your post hospital discharge needs so that they can reassess your need for medications and monitor your lab values.    Time coordinating discharge: 40 minutes  SIGNED:   Shelly Coss, MD  [facility] 06/15/2018, 1:07 PM Pager [phone]  If 7PM-7AM, please contact night-coverage [email or web address] Password [email or web address]

## 2018-06-15 NOTE — PMR Pre-admission (Addendum)
PMR Admission Coordinator Pre-Admission Assessment  Patient: Tyler [name] is an 85 y.o., male MRN: [id number] DOB: 05-24-1928 Height: 5\' 11"  (180.3 cm) Weight: 74.8 kg              Insurance Information HMO:     PPO: Yes     PCP:      IPA:      80/20:      OTHER:  PRIMARY: [employer]      Policy#: [id number]      Subscriber: Patient CM Name: Dani Gobble       Phone#: [phone]     Fax#:  Pre-Cert#: [id number]      Employer:  Josem Kaufmann provided by Benjamine Mola B for admit to CIR on 06/15/18. Pt is approved for 7 days. HTA has epic access so no need to fax clinical updates.  Benefits:  Phone #: NA     Name:[employer] Portal Eff. Date: 05/04/18     Deduct: NA      Out of Pocket Max: $3,400      Life Max: NA CIR: $295/day for days 1-6, $0/day for days 7+      SNF: $20/day for days 1-20, $160/day for days 21-100; 100 day limit Outpatient: per necessity    Co-Pay: $15/visit Home Health: 100%      Co-Pay:  DME: 80%     Co-Pay: 20% Providers:  SECONDARY:      Policy#:       Subscriber:  CM Name:       Phone#:      Fax#:  Pre-Cert#:       Employer:  Benefits:  Phone #:      Name:  Eff. Date:      Deduct:       Out of Pocket Max:       Life Max:  CIR:       SNF:  Outpatient:      Co-Pay:  Home Health:       Co-Pay:  DME:      Co-Pay:   Medicaid Application Date:       Case Manager:  Disability Application Date:       Case Worker:   Emergency Facilities manager Information    Name Relation Home Work [place] Spouse [phone]   No name specified         Current Medical History  Patient Admitting Diagnosis: pelvic fracture History of Present Illness: Pt is an 85 yo M with history of HTN, prior stroke, Type 2 DM, depression, and hyperlipidemia. Pt was admitted to the hospital with right hip pain after a mechanical fall and was found to have multiple nondisplaced fractures of his sacrum and pubic ramis. Per orthopedics, his  fractures are to be managed conservatively without operation. Pt has been evaluated by therapies with recommendation for CIR. Pt is to be admitted to CIR on 06/15/18.       Past Medical History  Past Medical History:  Diagnosis Date  . Cancer [facility])    Prostate  . Diabetes mellitus without complication ([place])   . Hypertension   . Kidney stones   . Stroke [facility])     Family History  family history includes Emphysema in his father; Heart Problems in his mother.  Prior Rehab/Hospitalizations:  Has the patient had major surgery during 100 days prior to admission? No  Current Medications   Current Facility-Administered Medications:  .  acetaminophen (TYLENOL) tablet 650 mg, 650  mg, Oral, Q6H, Oretha Milch D, MD, 650 mg at 06/15/18 0946 .  amLODipine (NORVASC) tablet 2.5 mg, 2.5 mg, Oral, QHS, Ivor Costa, MD, 2.5 mg at 06/14/18 2148 .  atorvastatin (LIPITOR) tablet 20 mg, 20 mg, Oral, Daily, Ivor Costa, MD, 20 mg at 06/15/18 0946 .  cholecalciferol (VITAMIN D3) tablet 1,000 Units, 1,000 Units, Oral, QHS, Ivor Costa, MD, 1,000 Units at 06/14/18 2148 .  clopidogrel (PLAVIX) tablet 75 mg, 75 mg, Oral, Q breakfast, Ivor Costa, MD, 75 mg at 06/15/18 0946 .  docusate sodium (COLACE) capsule 100 mg, 100 mg, Oral, BID, Ivor Costa, MD, 100 mg at 06/15/18 0946 .  heparin injection 5,000 Units, 5,000 Units, Subcutaneous, Q8H, Ivor Costa, MD, 5,000 Units at 06/15/18 1334 .  hydrALAZINE (APRESOLINE) injection 5 mg, 5 mg, Intravenous, Q2H PRN, Ivor Costa, MD .  insulin aspart (novoLOG) injection 0-5 Units, 0-5 Units, Subcutaneous, QHS, Niu, Xilin, MD .  insulin aspart (novoLOG) injection 0-9 Units, 0-9 Units, Subcutaneous, TID WC, Ivor Costa, MD, 3 Units at 06/15/18 1254 .  losartan (COZAAR) tablet 50 mg, 50 mg, Oral, Daily, Ivor Costa, MD, 50 mg at 06/15/18 0946 .  Melatonin TABS 3 mg, 3 mg, Oral, QHS, Ivor Costa, MD, 3 mg at 06/14/18 2148 .  methocarbamol (ROBAXIN) tablet 500 mg, 500 mg, Oral, Q8H  PRN, Ivor Costa, MD, 500 mg at 06/14/18 1225 .  morphine 2 MG/ML injection 0.5 mg, 0.5 mg, Intravenous, Q4H PRN, Ivor Costa, MD .  multivitamin with minerals tablet 1 tablet, 1 tablet, Oral, Daily, Oretha Milch D, MD, 1 tablet at 06/15/18 0946 .  polyethylene glycol (MIRALAX / GLYCOLAX) packet 17 g, 17 g, Oral, Daily PRN, Ivor Costa, MD, 17 g at 06/13/18 1025 .  sertraline (ZOLOFT) tablet 100 mg, 100 mg, Oral, Daily, Ivor Costa, MD, 100 mg at 06/15/18 0946 .  traMADol (ULTRAM) tablet 50 mg, 50 mg, Oral, Q6H PRN, Oretha Milch D, MD, 50 mg at 06/15/18 0528 .  vitamin B-12 (CYANOCOBALAMIN) tablet 1,000 mcg, 1,000 mcg, Oral, Daily, Ivor Costa, MD, 1,000 mcg at 06/15/18 7106  Patients Current Diet:  Diet Order            Diet regular Room service appropriate? Yes; Fluid consistency: Thin  Diet effective now        Diet - low sodium heart healthy              Precautions / Restrictions Precautions Precautions: Fall Precaution Comments: extremely HOH Restrictions Weight Bearing Restrictions: Yes RLE Weight Bearing: Weight bearing as tolerated LLE Weight Bearing: Weight bearing as tolerated   Has the patient had 2 or more falls or a fall with injury in the past year?No, not until this admission.  Prior Activity Level Community (5-7x/wk): Pt is retired but driving and active PTA.   Home Assistive Devices / Equipment Home Equipment: Shower seat - built in, Grab bars - tub/shower, Engineer, manufacturing held shower head, Cane - quad(from prior chart)  Prior Device Use: Indicate devices/aids used by the patient prior to current illness, exacerbation or injury? quad cane  Prior Functional Level Prior Function Level of Independence: Independent with assistive device(s)  Self Care: Did the patient need help bathing, dressing, using the toilet or eating?  Independent for bathing, toileting, feeding; some assist for buttoning and ties.   Indoor Mobility: Did the patient need assistance with walking  from room to room (with or without device)? Independent  Stairs: Did the patient need assistance with internal or external stairs (with  or without device)? Independent  Functional Cognition: Did the patient need help planning regular tasks such as shopping or remembering to take medications? Independent  Current Functional Level Cognition  Overall Cognitive Status: Within Functional Limits for tasks assessed General Comments: pt very motivated to get better and go home    Extremity Assessment (includes Sensation/Coordination)  Upper Extremity Assessment: Generalized weakness  Lower Extremity Assessment: Generalized weakness, Defer to PT evaluation RLE Deficits / Details: pt initiating hip/knee flexion in supine, able to complete quad set, glut set and ankle pumps RLE Sensation: WNL LLE Deficits / Details: pt with full active hip/knee flexion in bed, able to complete quad and glut set as well as ankle pumps LLE Sensation: WNL    ADLs  Overall ADL's : Needs assistance/impaired Eating/Feeding: Set up, Sitting(could not cut up own meal) Grooming: Set up, Sitting Upper Body Bathing: Set up, Sitting Lower Body Bathing: Moderate assistance Lower Body Bathing Details (indicate cue type and reason): Mod A sit<>stand and maintain due to tendency for posterior lean Upper Body Dressing : Set up, Sitting Lower Body Dressing: Moderate assistance Lower Body Dressing Details (indicate cue type and reason): Mod A sit<>stand and maintain due to tendency for posterior lean Toilet Transfer: Moderate assistance, Ambulation, +2 for safety/equipment, RW Toilet Transfer Details (indicate cue type and reason): bed>around to recliner Toileting- Clothing Manipulation and Hygiene: Moderate assistance Toileting - Clothing Manipulation Details (indicate cue type and reason): Mod A sit<>stand and maintain due to tendency for posterior lean  Functional mobility during ADLs: Maximal assistance, Rolling walker(MaxA  +1 for transfer from bed- elevated to recliner ) General ADL Comments: set-upA for UB  to maxA for LB ADL due to pain    Mobility  Overal bed mobility: Needs Assistance Bed Mobility: Sit to Supine Supine to sit: Mod assist, HOB elevated Sit to supine: Max assist, HOB elevated General bed mobility comments: maxA for scooting hips to EOB with HOB elevated and using pad to move hips to EOB. Pt able to sit upright for mobility.    Transfers  Overall transfer level: Needs assistance Equipment used: Rolling walker (2 wheeled) Transfers: Sit to/from Stand Sit to Stand: Max assist, From elevated surface General transfer comment: maxA for power up; maximal verbal cues for hand placement and sequencing to recliner.    Ambulation / Gait / Stairs / Wheelchair Mobility  Ambulation/Gait Ambulation/Gait assistance: Mod assist, +2 safety/equipment Gait Distance (Feet): 3 Feet Assistive device: Rolling walker (2 wheeled) Gait Pattern/deviations: Step-to pattern, Decreased stride length, Decreased stance time - right, Antalgic General Gait Details: antalgic, limited R LE WBing tolerance due to pain, increased bilat UE support, verbal cues for stepping sequence, increased time, increased R Hip pain Gait velocity: slow Gait velocity interpretation: <1.31 ft/sec, indicative of household ambulator    Posture / Balance Balance Overall balance assessment: Needs assistance Sitting-balance support: Feet supported Sitting balance-Leahy Scale: Fair Postural control: Posterior lean Standing balance support: Bilateral upper extremity supported Standing balance-Leahy Scale: Poor Standing balance comment: dependent on RW; posterior lean requiring assist to stand upright    Special needs/care consideration BiPAP/CPAP: no CPM: no Continuous Drip IV: no Dialysis: no        Days: no Life Vest: no Oxygen: no Special Bed: no Trach Size: no Wound Vac (area): no   Location: NA Skin: ecchymosis on right hip and  abdomen, skin tear on right elbow Bowel mgmt: continent, 06/14/18 Bladder mgmt:exteneral catheter in place Diabetic mgmt: Yes     Previous Home Environment Living  Arrangements: Spouse/significant other Available Help at Discharge: Family, Available 24 hours/day Type of Home: House Home Layout: Two level Alternate Level Stairs-Rails: Right(from prior chart) Alternate Level Stairs-Number of Steps: 3 Home Access: Level entry Bathroom Shower/Tub: Multimedia programmer: Handicapped height  Discharge Living Setting Plans for Discharge Living Setting: Patient's home, Lives with (comment)(wife) Type of Home at Discharge: House Discharge Home Layout: One level Discharge Home Access: Level entry Discharge Bathroom Shower/Tub: Walk-in shower Discharge Bathroom Toilet: Handicapped height Discharge Bathroom Accessibility: Yes How Accessible: Accessible via walker Does the patient have any problems obtaining your medications?: No  Social/Family/Support Systems Patient Roles: Spouse Contact Information: wife: [name]: [phone] (mobile); home: [phone] (home); [phone] (work) Anticipated Caregiver: wife Anticipated Ambulance person Information: see above Ability/Limitations of Caregiver: Min A Caregiver Availability: 24/7 Discharge Plan Discussed with Primary Caregiver: Yes Is Caregiver In Agreement with Plan?: Yes Does Caregiver/Family have Issues with Lodging/Transportation while Pt is in Rehab?: No   Goals/Additional Needs Patient/Family Goal for Rehab: PT/OT: Supervision/Min A; SLP: NA Expected length of stay: 17-21 days Cultural Considerations: [employer] Dietary Needs: Regular diet, thin liquids Equipment Needs: NA Pt/Family Agrees to Admission and willing to participate: Yes Program Orientation Provided & Reviewed with Pt/Caregiver Including Roles  & Responsibilities: Yes(pt and wife)  Barriers to Discharge: Other (comments)(none anticipated)   Decrease  burden of Care through IP rehab admission: NA   Possible need for SNF placement upon discharge: Potentially, however hopefully not as pt has good social support at home. The pt's wife is able to provide the recommended assist level at DC.    Patient Condition: This patient's medical and functional status has changed since the consult dated 06/13/18 in which the Rehabilitation Physician determined and documented that the patient was potentially appropriate for intensive rehabilitative care in an inpatient rehabilitation facility. Issues have been addressed and update has been discussed with Dr. Posey Pronto and patient now appropriate for inpatient rehabilitation. Pt has the recommended assist level at DC after explanation of what Min A might entail. Will admit to inpatient rehab today.   Preadmission Screen Completed By:  Jhonnie Garner, 06/15/2018 3:28 PM ______________________________________________________________________   Discussed status with Dr. Posey Pronto on 06/15/18 at 3:28PM and received telephone approval for admission today.  Admission Coordinator:  Jhonnie Garner, time 3:28PM Sudie Grumbling 06/15/18

## 2018-06-15 NOTE — Progress Notes (Signed)
Occupational Therapy Treatment Patient Details Name: Tyler [name] MRN: [id number] DOB: [birth year] Today's Date: 06/15/2018    History of present illness BAYARD MORE is a 85 y.o. male with medical history significant of hypertension, hyperlipidemia, diabetes mellitus, stroke, GERD, CKD 3, prostate cancer, depression, who presents with a fall, right hip pain and right groin pain.. CT: Acute nondisplaced fractures of the right sacral ala, right superior pubic ramus, and right inferior pubic ramus   OT comments  Pt is an 85 yo male s/p above dx. Pt with soreness noted mostly in RLE with movement. Pt maxA overall for bed mobility to sitting EOB; maxA for sit to stand and taking small steps to recliner. Pt with posterior lean noted with transfers requiring moderate assist for standing upright. Pt's BUEs appear very strong as pt leaning on BUEs for support with RW. Pt requires set-upA for breakfast tray. Pt greatly requires continued OT skilled services for ADL, mobility and safety in CIR setting. Pt progressing.    Follow Up Recommendations  CIR;Supervision/Assistance - 24 hour    Equipment Recommendations  3 in 1 bedside commode    Recommendations for Other Services Rehab consult    Precautions / Restrictions Precautions Precautions: Fall Precaution Comments: extremely HOH Restrictions Weight Bearing Restrictions: Yes RLE Weight Bearing: Weight bearing as tolerated LLE Weight Bearing: Weight bearing as tolerated       Mobility Bed Mobility Overal bed mobility: Needs Assistance Bed Mobility: Sit to Supine     Supine to sit: Mod assist;HOB elevated Sit to supine: Max assist;HOB elevated   General bed mobility comments: maxA for scooting hips to EOB with HOB elevated and using pad to move hips to EOB. Pt able to sit upright for mobility.  Transfers Overall transfer level: Needs assistance Equipment used: Rolling walker (2 wheeled) Transfers: Sit to/from Stand Sit to Stand:  Max assist;From elevated surface         General transfer comment: maxA for power up; maximal verbal cues for hand placement and sequencing to recliner.    Balance Overall balance assessment: Needs assistance Sitting-balance support: Feet supported Sitting balance-Leahy Scale: Fair       Standing balance-Leahy Scale: Poor Standing balance comment: dependent on RW; posterior lean requiring assist to stand upright                           ADL either performed or assessed with clinical judgement   ADL Overall ADL's : Needs assistance/impaired Eating/Feeding: Set up;Sitting(could not cut up own meal)                                   Functional mobility during ADLs: Maximal assistance;Rolling walker(MaxA +1 for transfer from bed- elevated to recliner ) General ADL Comments: set-upA for UB  to maxA for LB ADL due to pain     Vision       Perception     Praxis      Cognition Arousal/Alertness: Awake/alert Behavior During Therapy: WFL for tasks assessed/performed Overall Cognitive Status: Within Functional Limits for tasks assessed                                 General Comments: pt very motivated to get better and go home        Exercises     Shoulder  Instructions       General Comments Dizziness in sitting; resolved within a few seconds    Pertinent Vitals/ Pain       Pain Assessment: Faces Faces Pain Scale: Hurts little more Pain Location: right leg with ambulation Pain Descriptors / Indicators: Aching;Sore Pain Intervention(s): Limited activity within patient's tolerance  Home Living                                          Prior Functioning/Environment              Frequency  Min 2X/week        Progress Toward Goals  OT Goals(current goals can now be found in the care plan section)  Progress towards OT goals: Progressing toward goals  Acute Rehab OT Goals Patient Stated Goal:  home OT Goal Formulation: With patient Time For Goal Achievement: 06/27/18 Potential to Achieve Goals: Good ADL Goals Pt Will Perform Grooming: with modified independence;standing Pt Will Perform Lower Body Bathing: with modified independence;sit to/from stand Pt Will Perform Lower Body Dressing: with modified independence;sit to/from stand Pt Will Transfer to Toilet: with modified independence;ambulating;bedside commode Pt Will Perform Toileting - Clothing Manipulation and hygiene: with modified independence;sit to/from stand Additional ADL Goal #1: pt will be Mod I in and OOB with HOB flat and no rail  Plan Discharge plan remains appropriate    Co-evaluation                 AM-PAC OT "6 Clicks" Daily Activity     Outcome Measure       Help from another person toileting, which includes using toliet, bedpan, or urinal?: A Lot Help from another person bathing (including washing, rinsing, drying)?: A Lot Help from another person to put on and taking off regular upper body clothing?: A Little Help from another person to put on and taking off regular lower body clothing?: A Lot 6 Click Score: 9    End of Session Equipment Utilized During Treatment: Gait belt;Rolling walker  OT Visit Diagnosis: Unsteadiness on feet (R26.81);Other abnormalities of gait and mobility (R26.89);Pain Pain - Right/Left: Right Pain - part of body: Leg   Activity Tolerance Patient tolerated treatment well   Patient Left in chair;with call bell/phone within reach;with chair alarm set   Nurse Communication Mobility status        Time: 5053-9767 OT Time Calculation (min): 36 min  Charges: OT General Charges $OT Visit: 1 Visit OT Treatments $Self Care/Home Management : 8-22 mins $Neuromuscular Re-education: 8-22 mins  Darryl Nestle) Marsa Aris OTR/L Acute Rehabilitation Services Pager: [phone] Office: [phone]    Fredda Hammed 06/15/2018, 9:06 AM

## 2018-06-15 NOTE — Progress Notes (Signed)
Patient arrived to floor with nurses and family. He rated his pain a 0 as long as he was not moving. He was clean and dry and happy to be at Rehab. He is eating dinner and talking with family.

## 2018-06-15 NOTE — Progress Notes (Addendum)
Patient discharged to 4M07, reported to nurse Awndrea.

## 2018-06-15 NOTE — Progress Notes (Signed)
PROGRESS NOTE    Tyler [name]  DJT:[id number] DOB: 1928/09/08 DOA: 06/12/2018 PCP: [name], MD   Brief Narrative: Patient is 85 year old male with past medical history of a stroke, hypertension, CKD stage III, type 2 diabetes mellitus who presents from a fall in the charge.  Patient was found to have multiple pelvic fractures.  Seen by orthopedics and not recommended surgery.  Currently he is waiting for CIR.  Assessment & Plan:   Principal Problem:   Closed fracture of pubic ramus, right, initial encounter ([place]) Active Problems:   History of stroke   Hyperlipidemia   Essential hypertension   CKD (chronic kidney disease), stage III (HCC)   Type II diabetes mellitus with renal manifestations (HCC)   Fall   Leukocytosis   Pain in joint involving right pelvic region and thigh   Thrombocytopenia (HCC)   Diabetes mellitus type 2 in obese (HCC)   Anemia of chronic disease  Multiple pelvic fractures: Mechanical fall at [employer].  Orthopedics was following.  Advised against surgery.  Continue pain management.  Patient looks comfortable. Physical therapy recommended CIR on discharge.  CIR consulted. Follow up with orthopedics on discharge.  History of thrombotic stroke: Continue Plavix.  History of hyperlipidemia: Continue Lipitor  History of hypertension: Currently blood pressure is well controlled.  Continue current meds  Type 2 diabetes mellitus: Continue current insulin regimen  Depression: Continue home medication.  Zoloft.  Chronic normocytic anemia: Currently stable  CKD stage III: Baseline creatinine ranged from 1.4-1.6.  Currently kidney function is on baseline.  Nutrition Problem: Increased nutrient needs Etiology: acute illness      DVT prophylaxis: Heparin [place] Code Status: Full code Family Communication: Family member present at the bedside disposition Plan: CIR as soon as bed is available   Consultants: Orthopedics  Procedures: None  Antimicrobials:    Anti-infectives (From admission, onward)   None      Subjective: Patient seen and examined the bedside this morning.  Remains comfortable.  Pain well controlled on rest.  Hemodynamically stable.  Complains of pain only on ambulation.  Objective: Vitals:   06/14/18 0539 06/14/18 1705 06/14/18 2136 06/15/18 0516  BP: (!) 144/69 136/65 (!) 128/56 (!) 145/71  Pulse: 73 65 70 66  Resp: 18  18 20   Temp: 98.8 F (37.1 C) 97.9 F (36.6 C) 98.4 F (36.9 C) 97.8 F (36.6 C)  TempSrc: Oral Oral Oral Oral  SpO2: 95% 98% 96% 97%  Weight:      Height:        Intake/Output Summary (Last 24 hours) at 06/15/2018 1159 Last data filed at 06/15/2018 3009 Gross per 24 hour  Intake -  Output 700 ml  Net -700 ml   Filed Weights   06/12/18 1203 06/12/18 2048  Weight: 74.8 kg 74.8 kg    Examination:  General exam: Appears calm and comfortable ,Not in distress, elderly gentleman  HEENT:PERRL,Oral mucosa moist, Ear/Nose normal on gross exam Respiratory system: Bilateral equal air entry, normal vesicular breath sounds, no wheezes or crackles  Cardiovascular system: S1 & S2 heard, RRR. [name], murmurs, rubs, gallops or clicks. No pedal edema. Gastrointestinal system: Abdomen is nondistended, soft and nontender. No organomegaly or masses felt. Normal bowel sounds heard. Central nervous system: Alert and oriented. No focal neurological deficits. Extremities: No edema, no clubbing ,no cyanosis, distal peripheral pulses palpable. Decreased range of motion on bilateral lower extremities due to pain Skin: No rashes, lesions or ulcers,no icterus ,no pallor MSK: Normal muscle bulk,tone ,power  Psychiatry: Judgement and insight appear normal. Mood & affect appropriate.     Data Reviewed: I have personally reviewed following labs and imaging studies  CBC: Recent Labs  Lab 06/12/18 1744 06/13/18 0224 06/14/18 0350 06/15/18 0157  WBC 12.9* 9.4 9.9 9.9  NEUTROABS 11.1*  --   --   --   HGB 11.9*  11.1* 11.1* 10.9*  HCT 35.8* 34.1* 32.9* 32.3*  MCV 96.5 94.7 95.1 95.8  PLT 154 141* 146* 093*   Basic Metabolic Panel: Recent Labs  Lab 06/12/18 1744 06/13/18 0224 06/14/18 0350 06/15/18 0157  NA 140 138 139 138  K 4.0 4.2 4.2 4.1  CL 111 107 109 107  CO2 20* 21* 21* 21*  GLUCOSE 79 221* 143* 114*  BUN 28* 27* 30* 36*  CREATININE 1.44* 1.55* 1.65* 1.61*  CALCIUM 9.3 9.0 9.0 9.1   GFR: Estimated Creatinine Clearance: 34.2 mL/min (A) (by C-G formula based on SCr of 1.61 mg/dL (H)). Liver Function Tests: No results for input(s): AST, ALT, ALKPHOS, BILITOT, PROT, ALBUMIN in the last 168 hours. No results for input(s): LIPASE, AMYLASE in the last 168 hours. No results for input(s): AMMONIA in the last 168 hours. Coagulation Profile: Recent Labs  Lab 06/13/18 0224  INR 1.17   Cardiac Enzymes: No results for input(s): CKTOTAL, CKMB, CKMBINDEX, TROPONINI in the last 168 hours. BNP (last 3 results) No results for input(s): PROBNP in the last 8760 hours. HbA1C: No results for input(s): HGBA1C in the last 72 hours. CBG: Recent Labs  Lab 06/14/18 0815 06/14/18 1230 06/14/18 1728 06/14/18 2135 06/15/18 0828  GLUCAP 123* 155* 94 181* 122*   Lipid Profile: No results for input(s): CHOL, HDL, LDLCALC, TRIG, CHOLHDL, LDLDIRECT in the last 72 hours. Thyroid Function Tests: No results for input(s): TSH, T4TOTAL, FREET4, T3FREE, THYROIDAB in the last 72 hours. Anemia Panel: No results for input(s): VITAMINB12, FOLATE, FERRITIN, TIBC, IRON, RETICCTPCT in the last 72 hours. Sepsis Labs: No results for input(s): PROCALCITON, LATICACIDVEN in the last 168 hours.  No results found for this or any previous visit (from the past 240 hour(s)).       Radiology Studies: No results found.      Scheduled Meds: . acetaminophen  650 mg Oral Q6H  . amLODipine  2.5 mg Oral QHS  . atorvastatin  20 mg Oral Daily  . cholecalciferol  1,000 Units Oral QHS  . clopidogrel  75 mg Oral  Q breakfast  . docusate sodium  100 mg Oral BID  . heparin  5,000 Units Subcutaneous Q8H  . insulin aspart  0-5 Units Subcutaneous QHS  . insulin aspart  0-9 Units Subcutaneous TID WC  . losartan  50 mg Oral Daily  . Melatonin  3 mg Oral QHS  . multivitamin with minerals  1 tablet Oral Daily  . sertraline  100 mg Oral Daily  . vitamin B-12  1,000 mcg Oral Daily   Continuous Infusions:   LOS: 0 days    Time spent: 35 mins.More than 50% of that time was spent in counseling and/or coordination of care.      Shelly Coss, MD [facility] Pager [phone]  If 7PM-7AM, please contact night-coverage [email or web address] Password [facility] 06/15/2018, 11:59 AM

## 2018-06-16 ENCOUNTER — Inpatient Hospital Stay (HOSPITAL_COMMUNITY): Payer: Self-pay | Admitting: Physical Therapy

## 2018-06-16 ENCOUNTER — Inpatient Hospital Stay (HOSPITAL_COMMUNITY): Payer: Self-pay | Admitting: Occupational Therapy

## 2018-06-16 DIAGNOSIS — N179 Acute kidney failure, unspecified: Secondary | ICD-10-CM

## 2018-06-16 DIAGNOSIS — I10 Essential (primary) hypertension: Secondary | ICD-10-CM

## 2018-06-16 DIAGNOSIS — S3282XD Multiple fractures of pelvis without disruption of pelvic ring, subsequent encounter for fracture with routine healing: Secondary | ICD-10-CM

## 2018-06-16 DIAGNOSIS — D62 Acute posthemorrhagic anemia: Secondary | ICD-10-CM

## 2018-06-16 DIAGNOSIS — E119 Type 2 diabetes mellitus without complications: Secondary | ICD-10-CM

## 2018-06-16 LAB — CBC WITH DIFFERENTIAL/PLATELET
Abs Immature Granulocytes: 0.06 10*3/uL (ref 0.00–0.07)
Basophils Absolute: 0.1 10*3/uL (ref 0.0–0.1)
Basophils Relative: 1 %
Eosinophils Absolute: 0.8 10*3/uL — ABNORMAL HIGH (ref 0.0–0.5)
Eosinophils Relative: 8 %
HCT: 36.6 % — ABNORMAL LOW (ref 39.0–52.0)
Hemoglobin: 11.6 g/dL — ABNORMAL LOW (ref 13.0–17.0)
Immature Granulocytes: 1 %
Lymphocytes Relative: 18 %
Lymphs Abs: 1.6 10*3/uL (ref 0.7–4.0)
MCH: 30.6 pg (ref 26.0–34.0)
MCHC: 31.7 g/dL (ref 30.0–36.0)
MCV: 96.6 fL (ref 80.0–100.0)
MONO ABS: 0.7 10*3/uL (ref 0.1–1.0)
Monocytes Relative: 7 %
Neutro Abs: 6 10*3/uL (ref 1.7–7.7)
Neutrophils Relative %: 65 %
PLATELETS: 170 10*3/uL (ref 150–400)
RBC: 3.79 MIL/uL — ABNORMAL LOW (ref 4.22–5.81)
RDW: 13.1 % (ref 11.5–15.5)
WBC: 9.1 10*3/uL (ref 4.0–10.5)
nRBC: 0 % (ref 0.0–0.2)

## 2018-06-16 LAB — COMPREHENSIVE METABOLIC PANEL
ALT: 25 U/L (ref 0–44)
AST: 17 U/L (ref 15–41)
Albumin: 3.5 g/dL (ref 3.5–5.0)
Alkaline Phosphatase: 59 U/L (ref 38–126)
Anion gap: 9 (ref 5–15)
BUN: 35 mg/dL — ABNORMAL HIGH (ref 8–23)
CHLORIDE: 106 mmol/L (ref 98–111)
CO2: 23 mmol/L (ref 22–32)
Calcium: 9.4 mg/dL (ref 8.9–10.3)
Creatinine, Ser: 1.7 mg/dL — ABNORMAL HIGH (ref 0.61–1.24)
GFR calc Af Amer: 41 mL/min — ABNORMAL LOW (ref >60)
GFR calc non Af Amer: 35 mL/min — ABNORMAL LOW (ref >60)
Glucose, Bld: 134 mg/dL — ABNORMAL HIGH (ref 70–99)
Potassium: 4.5 mmol/L (ref 3.5–5.1)
SODIUM: 138 mmol/L (ref 135–145)
Total Bilirubin: 1 mg/dL (ref 0.3–1.2)
Total Protein: 6.2 g/dL — ABNORMAL LOW (ref 6.5–8.1)

## 2018-06-16 LAB — GLUCOSE, CAPILLARY
GLUCOSE-CAPILLARY: 138 mg/dL — AB (ref 70–99)
Glucose-Capillary: 157 mg/dL — ABNORMAL HIGH (ref 70–99)
Glucose-Capillary: 108 mg/dL — ABNORMAL HIGH (ref 70–99)
Glucose-Capillary: 180 mg/dL — ABNORMAL HIGH (ref 70–99)
Glucose-Capillary: 183 mg/dL — ABNORMAL HIGH (ref 70–99)

## 2018-06-16 NOTE — Progress Notes (Signed)
[place] PHYSICAL MEDICINE & REHABILITATION PROGRESS NOTE  Subjective/Complaints: Patient seen sitting up in bed this morning.  He states he slept well overnight, except for his condom cath that came loose and soiled everything.  He states he is ready for therapies.  ROS: Denies CP, shortness of breath, nausea, vomiting, diarrhea.  Objective: Vital Signs: Blood pressure 139/65, pulse 63, temperature 98.4 F (36.9 C), temperature source Oral, resp. rate 18, height 5\' 11"  (1.803 m), weight 74.8 kg, SpO2 96 %. No results found. Recent Labs    06/15/18 0157 06/16/18 0728  WBC 9.9 9.1  HGB 10.9* 11.6*  HCT 32.3* 36.6*  PLT 145* 170   Recent Labs    06/14/18 0350 06/15/18 0157  NA 139 138  K 4.2 4.1  CL 109 107  CO2 21* 21*  GLUCOSE 143* 114*  BUN 30* 36*  CREATININE 1.65* 1.61*  CALCIUM 9.0 9.1    Physical Exam: BP 139/65 (BP Location: Left Arm)   Pulse 63   Temp 98.4 F (36.9 C) (Oral)   Resp 18   Ht 5\' 11"  (1.803 m)   Wt 74.8 kg   SpO2 96%   BMI 23.01 kg/m  Constitutional: No distress . Vital signs reviewed. HENT: Normocephalic.  Atraumatic. Eyes: EOMI. No discharge. Cardiovascular: RRR. [name]. Respiratory: CTA Bilaterally. Normal effort. GI: BS +. Non-distended. Musc: Tenderness to right hip Neurological: He is alert and oriented.  HOH.  Able to follow basic commands without difficulty.  Motor: Right upper extremity: 4+/5 Left upper extremity: 5/5 Right lower extremity: 4/5 (some pain inhibition) Skin: Warm and dry.  Intact. Psychiatric: His speech is delayed. He is slowed. Cognition and memory are impaired.   Assessment/Plan: 1. Functional deficits secondary to pelvic fracture which require 3+ hours per day of interdisciplinary therapy in a comprehensive inpatient rehab setting.  Physiatrist is providing close team supervision and 24 hour management of active medical problems listed below.  Physiatrist and rehab team continue to assess barriers to  discharge/monitor patient progress toward functional and medical goals  Care Tool:  Bathing              Bathing assist       Upper Body Dressing/Undressing Upper body dressing   What is the patient wearing?: Hospital gown only    Upper body assist Assist Level: Minimal Assistance - Patient > 75%    Lower Body Dressing/Undressing Lower body dressing      What is the patient wearing?: Incontinence brief     Lower body assist Assist for lower body dressing: Maximal Assistance - Patient 25 - 49%     Toileting Toileting    Toileting assist       Transfers Chair/bed transfer  Transfers assist           Locomotion Ambulation   Ambulation assist              Walk 10 feet activity   Assist           Walk 50 feet activity   Assist           Walk 150 feet activity   Assist           Walk 10 feet on uneven surface  activity   Assist           Wheelchair     Assist               Wheelchair 50 feet with 2 turns activity  Assist            Wheelchair 150 feet activity     Assist           Medical Problem List and Plan: 1.  Functional decline secondary to Pelvic fracture and pain.  Begin CIR 2.  DVT Prophylaxis/Anticoagulation: Pharmaceutical: Lovenox 3. Pain Management: Continue tylenol qid with ultram prn. Wife does not want patient on any narcotics.  4. H/o Anxiety disorder/Mood: Continue Zoloft daily. LCSW to follow for evaluation and support.  5. Neuropsych: This patient is capable of making decisions on his own behalf. 6. Skin/Wound Care: Routine pressure relief measures.  7. Fluids/Electrolytes/Nutrition: Monitor I/O.  8. T2DM: Resumed lower dose Amaryl. Monitor BS ac/hs and use SSI for elevated BS. Added bedtime snack  Monitor with increased mobility 9. HTN: Monitor BP bid--continue Norvasc at bedtime and Cozaar in am.   Monitor with increased mobility 10. Acute on chronic renal  failure: Encourage fluid intake.   Creatinine 1.70 on 2/13  Encourage fluids  Continue to monitor 11.  H/o CVA: On Plavix and Lipitor.  12. ABLA  Hemoglobin 11.6 on 2/13  Continue to monitor 13. Thrombocytopenia:?  Resolved  Platelets 170 on 2/13  Continue to monitor   LOS: 1 days A FACE TO FACE EVALUATION WAS PERFORMED   Lorie Phenix 06/16/2018, 8:10 AM

## 2018-06-16 NOTE — Evaluation (Signed)
Physical Therapy Assessment and Plan  Patient Details  Name: Tyler [name] MRN: [id number] Date of Birth: 08-03-1928  PT Diagnosis: Abnormality of gait, Difficulty walking, Muscle weakness and Pain in RLE with weight bearing Rehab Potential: Good ELOS: 10-14 days   Today's Date: 06/16/2018 PT Individual Time: 0800-0900 PT Individual Time Calculation (min): 60 min    Problem List:  Patient Active Problem List   Diagnosis Date Noted  . Acute blood loss anemia   . AKI (acute kidney injury) ([place])   . Diabetes mellitus type 2 in nonobese (HCC)   . Pelvic fracture ([place]) 06/15/2018  . Pain in joint involving right pelvic region and thigh   . Thrombocytopenia ([place])   . Diabetes mellitus type 2 in obese ([place])   . Anemia of chronic disease   . Type II diabetes mellitus with renal manifestations ([place]) 06/12/2018  . Fall 06/12/2018  . Closed fracture of pubic ramus, right, initial encounter ([place]) 06/12/2018  . Leukocytosis 06/12/2018  . Closed fracture of right pubis, initial encounter ([place]) 06/12/2018  . TIA (transient ischemic attack) 06/28/2016  . CKD (chronic kidney disease), stage III ([place]) 06/28/2016  . Microscopic hematuria 06/28/2016  . Femur fracture, right ([place]) 11/16/2015  . History of nephrolithiasis 11/16/2015  . Pseudobulbar affect 06/07/2014  . Essential hypertension   . Memory loss   . Speech abnormality 01/28/2012  . Cerebral embolism with cerebral infarction ([place]) 01/28/2012  . History of stroke 01/28/2012  . Type 2 diabetes mellitus without complication, without long-term current use of insulin ([place]) 01/28/2012  . Hyperlipidemia 01/28/2012  . History of prostate cancer 01/28/2012    Past Medical History:  Past Medical History:  Diagnosis Date  . Cancer [facility])    Prostate  . Diabetes mellitus without complication ([place])   . Hypertension   . Kidney stones   . Stroke [facility])    Past Surgical History:  Past Surgical History:  Procedure Laterality Date  .  CARDIAC CATHETERIZATION    . FEMUR IM NAIL Right 11/17/2015   Procedure: INTRAMEDULLARY (IM) NAIL FEMORAL;  Surgeon: Marybelle Killings, MD;  Location: [place];  Service: Orthopedics;  Laterality: Right;  . PROSTATE SURGERY      Assessment & Plan Clinical Impression: Patient is a 85 y.o. year old male with recent admission to the hospital on 06/12/2018 with history of T2DM, prostate cancer, HTN, CVA/TIA. He was admitted after a fall and onset of right hip/groin pain.  History taken from chart review and wife.  CT pelvis done revealing nondisplaced fracture of right sacral ala and superior/inferior pubic rami. Dr. Doran Durand consulted for input and recommended WBAT. He has had issues with acute on chronic renal failure as well as acute blood loss anemia with drop in Hgb to 10.9 and thrombocytopenia with platelets down to 145.  Therapy initiated and patient continues to be limited by pain, weakness and balance deficits. CIR recommended due to functional decline. Patient transferred to CIR on 06/15/2018 .   Patient currently requires mod with mobility secondary to muscle weakness, decreased cardiorespiratoy endurance, decreased coordination and decreased standing balance and decreased postural control.  Prior to hospitalization, patient was modified independent  with mobility and lived with Spouse in a House home.  Home access is  Level entry.  Patient will benefit from skilled PT intervention to maximize safe functional mobility, minimize fall risk and decrease caregiver burden for planned discharge home with 24 hour supervision.  Anticipate patient will benefit from follow up [place] at discharge.  PT - End of Session Activity Tolerance: Tolerates 30+ min activity with multiple rests Endurance Deficit: Yes Endurance Deficit Description: generalized deconditioning PT Assessment Rehab Potential (ACUTE/IP ONLY): Good PT Patient demonstrates impairments in the following area(s): Balance;Endurance;Motor;Pain PT Transfers  Functional Problem(s): Bed Mobility;Floor;Bed to Chair;Car;Furniture PT Locomotion Functional Problem(s): Ambulation;Wheelchair Mobility;Stairs PT Plan PT Intensity: Minimum of 1-2 x/day ,45 to 90 minutes PT Frequency: 5 out of 7 days PT Duration Estimated Length of Stay: 10-14 days PT Treatment/Interventions: Ambulation/gait training;Cognitive remediation/compensation;Discharge planning;DME/adaptive equipment instruction;Functional mobility training;Pain management;Psychosocial support;Splinting/orthotics;Therapeutic Activities;UE/LE Strength taining/ROM;Visual/perceptual remediation/compensation;Balance/vestibular training;Community reintegration;Disease management/prevention;Functional electrical stimulation;Neuromuscular re-education;Patient/family education;Skin care/wound management;Stair training;Therapeutic Exercise;UE/LE Coordination activities;Wheelchair propulsion/positioning PT Transfers Anticipated Outcome(s): supervision with LRAD PT Locomotion Anticipated Outcome(s): supervision with LRAD PT Recommendation Recommendations for Other Services: Therapeutic Recreation consult Therapeutic Recreation Interventions: Clinical cytogeneticist;Outing/community reintergration Follow Up Recommendations: Home health PT Patient destination: Home Equipment Recommended: To be determined   Skilled Therapeutic Intervention Pt received in bed for physical therapy evaluation and agreeable to tx. Physical therapy evaluation was completed and therapist educated pt on CIR therapy and weekly conference meetings.Pt also educated on WBAT orders for RLE. Pt transfers supine>sit with HOB elevated and with CGA to assist moving RLE off of bed. Pt transfers stand-pivot bed>w/c with mod assist and verbal cuing for hand and foot placement during transfer. Therapist assesses sensation, coordination, and MMT with results listed below. Pt sits<>stands with RW and mod assist. Pt ambulates 10 ft with RW and mod  assist + w/c follow with antalgic gait pattern RLE and verbal cuing to push BUE through RW when stepping forward. Pt propels w/c 75 ft with BUE and supervision with verbal cuing for increased arm extension when propelling w/c. Pt left sitting up in w/c with chair alarm donned and all needs in reach.  Pt c/o 6/10-7/10 pain in RLE with weight bearing activities, pain resolves with rest breaks. Pt was pre-medicated before therapy session.   PT Evaluation Precautions/Restrictions Precautions Precautions: Fall Restrictions Weight Bearing Restrictions: No RLE Weight Bearing: Weight bearing as tolerated General Chart Reviewed: Yes Additional Pertinent History: previous stroke affecting R side, T2DM, HTN Response to Previous Treatment: Patient reporting fatigue but able to participate. Family/Caregiver Present: No Vital Signs  Pain Pain Assessment Pain Scale: Faces Faces Pain Scale: Hurts a little bit Pain Type: Acute pain Pain Location: Leg Pain Orientation: Right Pain Descriptors / Indicators: Discomfort Pain Onset: With Activity Pain Intervention(s): Repositioned;Emotional support Home Living/Prior Functioning Home Living Living Arrangements: Spouse/significant other Available Help at Discharge: Family;Available 24 hours/day Type of Home: House Home Access: Level entry Home Layout: One level;Other (Comment) Alternate Level Stairs-Number of Steps: 3 Alternate Level Stairs-Rails: Right Bathroom Shower/Tub: Multimedia programmer: Handicapped height Bathroom Accessibility: Yes  Lives With: Spouse Prior Function Level of Independence: Independent with basic ADLs  Able to Take Stairs?: Yes Driving: Yes Vocation: Retired Leisure: Hobbies-no Vision/Perception  Perception Perception: Within Functional Limits Praxis Praxis: Intact  Cognition Overall Cognitive Status: Within Functional Limits for tasks assessed Arousal/Alertness: Awake/alert Orientation Level: Oriented  X4 Attention: Sustained Sustained Attention: Appears intact Memory: Appears intact Awareness: Appears intact Problem Solving: Appears intact Behaviors: Other (comment)(slightly anxious about attempting to walk) Safety/Judgment: Appears intact Sensation Sensation Light Touch: Appears Intact Coordination Gross Motor Movements are Fluid and Coordinated: No Fine Motor Movements are Fluid and Coordinated: No Finger Nose Finger Test: slow dysmetric movements Motor  Motor Motor: Abnormal postural alignment and control  Mobility Bed Mobility Bed Mobility: Supine to Sit Supine to Sit: Contact Guard/Touching assist(elevated  HOB) Transfers Transfers: Sit to Stand;Stand Pivot Transfers Sit to Stand: Moderate Assistance - Patient 50-74% Stand Pivot Transfers: Moderate Assistance - Patient 50 - 74% Stand Pivot Transfer Details: Verbal cues for sequencing;Verbal cues for precautions/safety;Tactile cues for weight beaing Transfer (Assistive device): Rolling walker Locomotion  Gait Ambulation: Yes Gait Assistance: Moderate Assistance - Patient 50-74%(mod assist + w/c follow) Gait Distance (Feet): 10 Feet Assistive device: Rolling walker Gait Assistance Details: Verbal cues for safe use of DME/AE;Visual cues for safe use of DME/AE;Verbal cues for precautions/safety Gait Assistance Details: verbal cuing for use of BUEs on RW during ambulation Gait Gait: Yes Gait Pattern: Impaired Gait Pattern: Decreased stance time - right;Decreased step length - left;Antalgic;Step-to pattern(R foot ER during gait, ) Gait velocity: decreased Stairs / Additional Locomotion Stairs: No Architect: Yes Wheelchair Assistance: Chartered loss adjuster: Both upper extremities Wheelchair Parts Management: Needs assistance Distance: 75 ft  Trunk/Postural Assessment  Cervical Assessment Cervical Assessment: Within Functional Limits Thoracic  Assessment Thoracic Assessment: Within Functional Limits Lumbar Assessment Lumbar Assessment: Within Functional Limits Postural Control Postural Control: Within Functional Limits  Balance Balance Balance Assessed: Yes Static Sitting Balance Static Sitting - Balance Support: Feet supported;Bilateral upper extremity supported Static Sitting - Level of Assistance: 6: Modified independent (Device/Increase time) Static Standing Balance Static Standing - Balance Support: Bilateral upper extremity supported;During functional activity Static Standing - Level of Assistance: 3: Mod assist Dynamic Standing Balance Dynamic Standing - Balance Support: Bilateral upper extremity supported;During functional activity Dynamic Standing - Level of Assistance: 3: Mod assist(mod assist + w/c follow) Extremity Assessment  RLE Assessment RLE Assessment: Exceptions to [facility] General Strength Comments: limited by pain RLE Strength Right Hip Flexion: 3/5 Right Hip ABduction: 4+/5 Right Hip ADduction: 3+/5 Right Knee Flexion: 3+/5 Right Knee Extension: 4-/5 Right Ankle Dorsiflexion: 3+/5 LLE Assessment LLE Assessment: Exceptions to [facility] General Strength Comments: LLE strength limited by pain in R pelvis LLE Strength Left Hip Flexion: 4-/5 Left Hip ABduction: 4+/5 Left Hip ADduction: 4/5 Left Knee Flexion: 4-/5 Left Knee Extension: 4-/5 Left Ankle Dorsiflexion: 4+/5    Refer to Care Plan for Long Term Goals  Recommendations for other services: Therapeutic Recreation  Kitchen group, Stress management and Outing/community reintegration  Discharge Criteria: Patient will be discharged from PT if patient refuses treatment 3 consecutive times without medical reason, if treatment goals not met, if there is a change in medical status, if patient makes no progress towards goals or if patient is discharged from hospital.  The above assessment, treatment plan, treatment alternatives and goals were discussed and  mutually agreed upon: by patient  Coral Spikes 06/16/2018, 12:24 PM

## 2018-06-16 NOTE — Progress Notes (Signed)
Physical Therapy Session Note  Patient Details  Name: Tyler [name] MRN: [id number] Date of Birth: 22-Feb-1929  Today's Date: 06/16/2018 PT Individual Time: 1100- 1130 and 1300-1400  PT Individual Time Calculation (min): 60 min and 60 min  Short Term Goals: Week 1:  PT Short Term Goal 1 (Week 1): Pt will ambulate 100 ft with RW and min assist PT Short Term Goal 2 (Week 1): Pt will perform sit to stand transfers with supervision PT Short Term Goal 3 (Week 1): Pt will ascend/descend 4 stairs with 2 handrails and min assist  Skilled Therapeutic Interventions/Progress Updates:  Session 1: See eval note  Session 2: Pt received sitting up in w/c with wife present and agreeable to tx. Pt wheeled around unit for time management. Pt ascends/descends 2 2" steps at parallel bars with mod assist and verbal and tactile cuing for correct leg sequencing. After rest break, pt ascends/descends 2 3" steps at parallel bars with mod assist and verbal/tactile cuing for correct leg sequencing. Pt c/o 7/10 pain with stairs but subsides with rest break. Pt ambulates 20 ft with RW and min assist>mod assist with fatigue, verbal cuing for BUE use on RW and WBAT on RLE. Pt demonstrates antalgic gait pattern with decreased weight shift to R and R foot ER. Pt reports 7/10 pain after ambulation and subsides with rest break. Pt left sitting up in w/c with chair alarm donned and wife present to supervise with all needs in reach.  Session 3: Pt received sitting up in w/c with wife present and agreeable to tx. Pt wheeled around unit for time management. Pt performs car transfer stand-pivot in sedan-simulated vehicle with mod assist and verbal cuing for hand placement during transfer. Pt transfers stand-pivot w/c>mat table with mod assist and RLE buckling with fatigue. Pt transfers sit<>supine with min assist to pull RLE up onto mat table and to lower trunk down to mat. Pt rolls to L side with min assist and voices increased pain in  RLE so rolled back to supine. Therapist provided bolster under knees to improve comfort. Pt performs bed exercises: heel slides, glute sets, and SAQ 2 x 10 each. Pt performs sitting exercises at edge of mat: marches and adductor squeezes with pillow 2 x 10. Pt ambulates 5 ft back to w/c with mod assist and antalgic gait with increased RLE buckling with fatigue. Pt left lying in bed with bed alarm on and wife present to supervise with all needs in reach.  Pt c/o increased pain during session to 10/10, RN notified and RN administered pain medication. Rest breaks were provided and therapist decreased exercise intensity.   Therapy Documentation Precautions:  Precautions Precautions: Fall Restrictions Weight Bearing Restrictions: No RLE Weight Bearing: Weight bearing as tolerated LLE Weight Bearing: Weight bearing as tolerated   Therapy/Group: Individual Therapy  [name] 06/16/2018, 12:45 PM

## 2018-06-16 NOTE — Progress Notes (Signed)
Social Work  Social Work Assessment and Plan  Patient Details  Name: Tyler [name] MRN: [id number] Date of Birth: 05/21/1928  Today's Date: 06/16/2018  Problem List:  Patient Active Problem List   Diagnosis Date Noted  . Acute blood loss anemia   . AKI (acute kidney injury) ([place])   . Diabetes mellitus type 2 in nonobese (HCC)   . Pelvic fracture ([place]) 06/15/2018  . Pain in joint involving right pelvic region and thigh   . Thrombocytopenia ([place])   . Diabetes mellitus type 2 in obese ([place])   . Anemia of chronic disease   . Type II diabetes mellitus with renal manifestations ([place]) 06/12/2018  . Fall 06/12/2018  . Closed fracture of pubic ramus, right, initial encounter ([place]) 06/12/2018  . Leukocytosis 06/12/2018  . Closed fracture of right pubis, initial encounter ([place]) 06/12/2018  . TIA (transient ischemic attack) 06/28/2016  . CKD (chronic kidney disease), stage III ([place]) 06/28/2016  . Microscopic hematuria 06/28/2016  . Femur fracture, right ([place]) 11/16/2015  . History of nephrolithiasis 11/16/2015  . Pseudobulbar affect 06/07/2014  . Essential hypertension   . Memory loss   . Speech abnormality 01/28/2012  . Cerebral embolism with cerebral infarction ([place]) 01/28/2012  . History of stroke 01/28/2012  . Type 2 diabetes mellitus without complication, without long-term current use of insulin ([place]) 01/28/2012  . Hyperlipidemia 01/28/2012  . History of prostate cancer 01/28/2012   Past Medical History:  Past Medical History:  Diagnosis Date  . Cancer [facility])    Prostate  . Diabetes mellitus without complication ([place])   . Hypertension   . Kidney stones   . Stroke [facility])    Past Surgical History:  Past Surgical History:  Procedure Laterality Date  . CARDIAC CATHETERIZATION    . FEMUR IM NAIL Right 11/17/2015   Procedure: INTRAMEDULLARY (IM) NAIL FEMORAL;  Surgeon: Marybelle Killings, MD;  Location: [place];  Service: Orthopedics;  Laterality: Right;  . PROSTATE SURGERY     Social  History:  reports that he quit smoking about 50 years ago. He has never used smokeless tobacco. He reports that he does not drink alcohol or use drugs.  Family / Support Systems Marital Status: Married Patient Roles: Spouse Spouse/Significant Other: wife, Dontell Mian @ [phone] Children: 2 adult sons living locally, however, both are working f/t. Other Supports: local grandaughter Anticipated Caregiver: wife Ability/Limitations of Caregiver: Min A Caregiver Availability: 24/7 Family Dynamics: Wife very supportive and able to provide 24/7 support.  Very encouraging to pt.  Social History Preferred language: English Religion: Baptist Cultural Background: NA Read: Yes Write: Yes Employment Status: Retired Public relations account executive Issues: None Guardian/Conservator: None - per MD, pt is capable of making decisions on his own behalf.   Abuse/Neglect Abuse/Neglect Assessment Can Be Completed: Yes Physical Abuse: Denies Verbal Abuse: Denies Sexual Abuse: Denies Exploitation of patient/patient's resources: Denies Self-Neglect: Denies  Emotional Status Pt's affect, behavior and adjustment status: Pt very pleasant and able to complete assessment interview without any difficulty.  Pt denies any significant emotional distress, however, will monitor throught stay.  Recent Psychosocial Issues: None Psychiatric History: None Substance Abuse History: None  Patient / Family Perceptions, Expectations & Goals Pt/Family understanding of illness & functional limitations: Pt and wife with good, general understanding of his fxs, current functional limitations/ need for CIR. Premorbid pt/family roles/activities: Pt completely independent and active at home and in community. Anticipated changes in roles/activities/participation: Wife to provide primary caregiver support.  Little change to roles  if pt able to reach supervision goals.  Community Resources [employer]:  None Premorbid Home Care/DME Agencies: None Transportation available at discharge: yes  Discharge Planning Living Arrangements: Spouse/significant other Support Systems: Spouse/significant other, Children Type of Residence: Private residence Administrator, sports: Multimedia programmer (specify)([employer]) Financial Resources: Radio broadcast assistant Screen Referred: No Living Expenses: Own Money Management: Patient Does the patient have any problems obtaining your medications?: No Home Management: pt and spouse share responsibilities Patient/Family Preliminary Plans: Pt to d/c home with wife who can provide 24/7 supervision and maybe some light assistance. Social Work Anticipated Follow Up Needs: HH/OP Expected length of stay: 10-14 days  Clinical Impression Very pleasant gentleman here following a fall in the community and suffering multiple pelvic fxs but is FWB bilaterally.  Pain is primary c/o. Wife at bedside and very engaged and supportive with pt.  She is able to provide supervision to light assistance in the home.  Pt denies any emotional distress.  Eager to begin tx program.  Will follow for support and d/c planning needs.  Artur Winningham 06/16/2018, 2:24 PM

## 2018-06-16 NOTE — Care Management (Signed)
[facility] Individual Statement of Services  Patient Name:  Tyler [name]  Date:  06/16/2018  Welcome to the [facility].  Our goal is to provide you with an individualized program based on your diagnosis and situation, designed to meet your specific needs.  With this comprehensive rehabilitation program, you will be expected to participate in at least 3 hours of rehabilitation therapies Monday-Friday, with modified therapy programming on the weekends.  Your rehabilitation program will include the following services:  Physical Therapy (PT), Occupational Therapy (OT), 24 hour per day rehabilitation nursing, Therapeutic Recreaction (TR), Case Management (Social Worker), Rehabilitation Medicine, Nutrition Services and Pharmacy Services  Weekly team conferences will be held on Wednesdays to discuss your progress.  Your Social Worker will talk with you frequently to get your input and to update you on team discussions.  Team conferences with you and your family in attendance may also be held.  Expected length of stay: 10-14 days Overall anticipated outcome: supervision  Depending on your progress and recovery, your program may change. Your Social Worker will coordinate services and will keep you informed of any changes. Your Social Worker's name and contact numbers are listed  below.  The following services may also be recommended but are not provided by the [facility] will be made to provide these services after discharge if needed.  Arrangements include referral to agencies that provide these services.  Your insurance has been verified to be:  [employer] Your primary doctor is:  Psychiatrist  Pertinent information will be shared with your doctor and your insurance company.  Social Worker:  [place], [place] or ([phone]   Information discussed with and copy given to patient by: Lennart Pall, 06/16/2018, 2:26 PM

## 2018-06-16 NOTE — Evaluation (Signed)
Occupational Therapy Assessment and Plan  Patient Details  Name: Tyler [name] MRN: [id number] Date of Birth: [birth year]  OT Diagnosis: acute pain, muscle weakness (generalized) and pain in joint Rehab Potential: Rehab Potential (ACUTE ONLY): Excellent ELOS: 7-9 days   Today's Date: 06/16/2018 OT Individual Time: 6378-5885 OT Individual Time Calculation (min): 61 min     Problem List:  Patient Active Problem List   Diagnosis Date Noted  . Acute blood loss anemia   . AKI (acute kidney injury) ([place])   . Diabetes mellitus type 2 in nonobese (HCC)   . Pelvic fracture ([place]) 06/15/2018  . Pain in joint involving right pelvic region and thigh   . Thrombocytopenia ([place])   . Diabetes mellitus type 2 in obese ([place])   . Anemia of chronic disease   . Type II diabetes mellitus with renal manifestations ([place]) 06/12/2018  . Fall 06/12/2018  . Closed fracture of pubic ramus, right, initial encounter ([place]) 06/12/2018  . Leukocytosis 06/12/2018  . Closed fracture of right pubis, initial encounter ([place]) 06/12/2018  . TIA (transient ischemic attack) 06/28/2016  . CKD (chronic kidney disease), stage III ([place]) 06/28/2016  . Microscopic hematuria 06/28/2016  . Femur fracture, right ([place]) 11/16/2015  . History of nephrolithiasis 11/16/2015  . Pseudobulbar affect 06/07/2014  . Essential hypertension   . Memory loss   . Speech abnormality 01/28/2012  . Cerebral embolism with cerebral infarction ([place]) 01/28/2012  . History of stroke 01/28/2012  . Type 2 diabetes mellitus without complication, without long-term current use of insulin ([place]) 01/28/2012  . Hyperlipidemia 01/28/2012  . History of prostate cancer 01/28/2012    Past Medical History:  Past Medical History:  Diagnosis Date  . Cancer [facility])    Prostate  . Diabetes mellitus without complication ([place])   . Hypertension   . Kidney stones   . Stroke [facility])    Past Surgical History:  Past Surgical History:  Procedure Laterality Date  .  CARDIAC CATHETERIZATION    . FEMUR IM NAIL Right 11/17/2015   Procedure: INTRAMEDULLARY (IM) NAIL FEMORAL;  Surgeon: Marybelle Killings, MD;  Location: [place];  Service: Orthopedics;  Laterality: Right;  . PROSTATE SURGERY      Assessment & Plan Clinical Impression: Patient is a 85 y.o. year old male with recent admission to the hospital on 06/12/2018 after a fall and onset of right hip/groin pain.  History taken from chart review and wife.  CT pelvis done revealing nondisplaced fracture of right sacral ala and superior/inferior pubic rami. Dr. Doran Durand consulted for input and recommended WBAT.[name]  Patient transferred to CIR on 06/15/2018 .    Patient currently requires mod with basic self-care skills secondary to muscle weakness and decreased standing balance and decreased balance strategies.  Prior to hospitalization, patient could complete ADLs with modified independent .  Patient will benefit from skilled intervention to decrease level of assist with basic self-care skills and increase independence with basic self-care skills prior to discharge home with care partner.  Anticipate patient will require 24 hour supervision and follow up home health.  OT - End of Session Activity Tolerance: Decreased this session Endurance Deficit: Yes Endurance Deficit Description: generalized deconditioning OT Assessment Rehab Potential (ACUTE ONLY): Excellent OT Patient demonstrates impairments in the following area(s): Balance;Pain;Endurance;Safety OT Basic ADL's Functional Problem(s): Grooming;Bathing;Dressing;Toileting OT Transfers Functional Problem(s): Toilet;Tub/Shower OT Plan OT Intensity: Minimum of 1-2 x/day, 45 to 90 minutes OT Frequency: 5 out of 7 days OT Duration/Estimated Length of Stay: 7-9 days OT Treatment/Interventions:  Balance/vestibular training;Community reintegration;Discharge planning;DME/adaptive equipment instruction;Functional mobility training;Pain management;Therapeutic Activities;UE/LE  Strength taining/ROM;UE/LE Coordination activities;Therapeutic Exercise;Wheelchair propulsion/positioning;Self Care/advanced ADL retraining;Patient/family education;Neuromuscular re-education OT Self Feeding Anticipated Outcome(s): independent OT Basic Self-Care Anticipated Outcome(s): supervision OT Toileting Anticipated Outcome(s): supervision OT Bathroom Transfers Anticipated Outcome(s): supervision OT Recommendation Patient destination: Home Follow Up Recommendations: Home health OT Equipment Recommended: To be determined   Skilled Therapeutic Intervention Pt worked on selfcare retraining during session sit to stand at the sink.  He was able to complete all UB selfcare with supervision.  Min assist for LB bathing and dressing sit to stand.  He was able to complete stand pivot transfers with mod assist for simulated toilet transfers.  He did exhibit some apprehension for standing secondary to anticipation of pain.  Discussed expected LOS at end of session as well as anticipated supervision level goals.  He was in agreement with recommendations.  Call button and phone in reach with safety alarm belt in place.    OT Evaluation Precautions/Restrictions  Precautions Precautions: Fall Restrictions Weight Bearing Restrictions: No RLE Weight Bearing: Weight bearing as tolerated   Pain Pain Assessment Pain Scale: Faces Faces Pain Scale: Hurts a little bit Pain Type: Acute pain Pain Location: Leg Pain Orientation: Right Pain Descriptors / Indicators: Discomfort Pain Onset: With Activity Pain Intervention(s): Repositioned;Emotional support Home Living/Prior Functioning Home Living Family/patient expects to be discharged to:: Private residence Living Arrangements: Spouse/significant other Available Help at Discharge: Family, Available 24 hours/day Type of Home: House Home Access: Level entry Home Layout: One level, Other (Comment) Alternate Level Stairs-Number of Steps: 3 Alternate  Level Stairs-Rails: Right Bathroom Shower/Tub: Multimedia programmer: Handicapped height Bathroom Accessibility: Yes  Lives With: Spouse IADL History Homemaking Responsibilities: No Current License: Yes Occupation: Retired Prior Function Level of Independence: Independent with basic ADLs  Able to [place]?: Yes Driving: Yes Vocation: Retired Leisure: Hobbies-no ADL ADL Eating: Independent Where Assessed-Eating: Wheelchair Grooming: Setup Where Assessed-Grooming: Clinical biochemist Bathing: Setup Where Assessed-Upper Body Bathing: Wheelchair Lower Body Bathing: Minimal assistance Where Assessed-Lower Body Bathing: Wheelchair Upper Body Dressing: Minimal assistance Where Assessed-Upper Body Dressing: Wheelchair Lower Body Dressing: Minimal assistance Where Assessed-Lower Body Dressing: Wheelchair Toileting: Minimal assistance Where Assessed-Toileting: Bedside Commode Toilet Transfer: Minimal assistance Toilet Transfer Method: Counselling psychologist: Bedside commode Vision Baseline Vision/History: Wears glasses Wears Glasses: At all times Patient Visual Report: No change from baseline Vision Assessment?: No apparent visual deficits Perception  Perception: Within Functional Limits Praxis Praxis: Intact Cognition Overall Cognitive Status: Within Functional Limits for tasks assessed Arousal/Alertness: Awake/alert Orientation Level: Person;Place;Situation Person: Oriented Place: Oriented Situation: Oriented Year: 2020 Month: February Day of Week: Correct Memory: Appears intact Immediate Memory Recall: Sock;Blue;Bed Memory Recall: Sock;Blue;Bed Memory Recall Sock: Without Cue Memory Recall Blue: Without Cue Memory Recall Bed: With Cue Attention: Sustained Sustained Attention: Appears intact Awareness: Appears intact Problem Solving: Appears intact Behaviors: Other (comment)(slightly anxious about attempting to walk) Safety/Judgment:  Appears intact Sensation Sensation Light Touch: Appears Intact Hot/Cold: Appears Intact Proprioception: Appears Intact Stereognosis: Appears Intact Coordination Gross Motor Movements are Fluid and Coordinated: No Fine Motor Movements are Fluid and Coordinated: No Coordination and Movement Description: Pt with gross arm AROM and coordination WFLs, but with slight tremor in bilateral hands.  Decreased strength and coordination in the RLE as well secondary to pain. Finger Nose Finger Test: slow dysmetric movements Motor  Motor Motor: Abnormal postural alignment and control Motor - Skilled Clinical Observations: Generalized weakness and pain in the RLE Mobility  Bed Mobility Bed  Mobility: Supine to Sit Supine to Sit: Contact Guard/Touching assist(elevated HOB) Transfers Sit to Stand: Moderate Assistance - Patient 50-74%  Trunk/Postural Assessment  Cervical Assessment Cervical Assessment: Within Functional Limits Thoracic Assessment Thoracic Assessment: Within Functional Limits Lumbar Assessment Lumbar Assessment: Within Functional Limits Postural Control Postural Control: Within Functional Limits  Balance Balance Balance Assessed: Yes Static Sitting Balance Static Sitting - Balance Support: Feet supported;Bilateral upper extremity supported Static Sitting - Level of Assistance: 6: Modified independent (Device/Increase time) Dynamic Sitting Balance Dynamic Sitting - Balance Support: During functional activity Dynamic Sitting - Level of Assistance: 5: Stand by assistance Static Standing Balance Static Standing - Balance Support: Bilateral upper extremity supported;During functional activity Static Standing - Level of Assistance: 4: Min assist Dynamic Standing Balance Dynamic Standing - Balance Support: During functional activity Dynamic Standing - Level of Assistance: 3: Mod assist Extremity/Trunk Assessment RUE Assessment RUE Assessment: Within Functional Limits LUE  Assessment LUE Assessment: Within Functional Limits     Refer to Care Plan for Long Term Goals  Recommendations for other services: None    Discharge Criteria: Patient will be discharged from OT if patient refuses treatment 3 consecutive times without medical reason, if treatment goals not met, if there is a change in medical status, if patient makes no progress towards goals or if patient is discharged from hospital.  The above assessment, treatment plan, treatment alternatives and goals were discussed and mutually agreed upon: by patient  Elvie Maines OTR/L 06/16/2018, 12:31 PM

## 2018-06-16 NOTE — Progress Notes (Signed)
Per nursing, patient was given "Data Collection Information Summary for Patients in Inpatient Rehabilitation Facilities with attached Privacy Act Statement Health Care Records" upon admission.    Patient information reviewed and entered into [facility] System by Becky Evanee Lubrano, PPS coordinator. Information including medical coding, function ability, and quality indicators will be reviewed and updated through discharge.   

## 2018-06-17 ENCOUNTER — Inpatient Hospital Stay (HOSPITAL_COMMUNITY): Payer: Self-pay

## 2018-06-17 ENCOUNTER — Inpatient Hospital Stay (HOSPITAL_COMMUNITY): Payer: Self-pay | Admitting: Physical Therapy

## 2018-06-17 ENCOUNTER — Inpatient Hospital Stay (HOSPITAL_COMMUNITY): Payer: Self-pay | Admitting: Occupational Therapy

## 2018-06-17 LAB — GLUCOSE, CAPILLARY
Glucose-Capillary: 129 mg/dL — ABNORMAL HIGH (ref 70–99)
Glucose-Capillary: 155 mg/dL — ABNORMAL HIGH (ref 70–99)
Glucose-Capillary: 82 mg/dL (ref 70–99)
Glucose-Capillary: 195 mg/dL — ABNORMAL HIGH (ref 70–99)

## 2018-06-17 NOTE — Progress Notes (Signed)
[place] PHYSICAL MEDICINE & REHABILITATION PROGRESS NOTE  Subjective/Complaints: Patient seen sitting up in bed this morning.  He states he slept well overnight.  He states he had a good first day of therapies yesterday.  He does note constipation.  ROS: + Constipation.  Denies CP, shortness of breath, nausea, vomiting, diarrhea.  Objective: Vital Signs: Blood pressure (!) 120/55, pulse 70, temperature 97.9 F (36.6 C), temperature source Oral, resp. rate 16, height 5\' 11"  (1.803 m), weight 74.8 kg, SpO2 98 %. No results found. Recent Labs    06/15/18 0157 06/16/18 0728  WBC 9.9 9.1  HGB 10.9* 11.6*  HCT 32.3* 36.6*  PLT 145* 170   Recent Labs    06/15/18 0157 06/16/18 0728  NA 138 138  K 4.1 4.5  CL 107 106  CO2 21* 23  GLUCOSE 114* 134*  BUN 36* 35*  CREATININE 1.61* 1.70*  CALCIUM 9.1 9.4    Physical Exam: BP (!) 120/55 (BP Location: Right Arm)   Pulse 70   Temp 97.9 F (36.6 C) (Oral)   Resp 16   Ht 5\' 11"  (1.803 m)   Wt 74.8 kg   SpO2 98%   BMI 23.00 kg/m  Constitutional: No distress . Vital signs reviewed. HENT: Normocephalic.  Atraumatic. Eyes: EOMI. No discharge. Cardiovascular: RRR.  [name]. Respiratory: CTA bilaterally.  Normal effort. GI: BS +. Non-distended. Musc: Tenderness to right hip, stable Neurological: He is alert and oriented.   HOH.  Able to follow basic commands without difficulty.  Motor: Right upper extremity: 4+/5 Left upper extremity: 5/5 Right lower extremity: 4/5 (some pain inhibition), stable Skin: Warm and dry.  Intact. Psychiatric: His speech is delayed. He is slowed. Cognition and memory are impaired.   Assessment/Plan: 1. Functional deficits secondary to pelvic fracture which require 3+ hours per day of interdisciplinary therapy in a comprehensive inpatient rehab setting.  Physiatrist is providing close team supervision and 24 hour management of active medical problems listed below.  Physiatrist and rehab team  continue to assess barriers to discharge/monitor patient progress toward functional and medical goals  Care Tool:  Bathing    Body parts bathed by patient: Right arm, Left arm, Chest, Abdomen, Front perineal area, Buttocks, Face, Right lower leg, Left upper leg, Right upper leg         Bathing assist Assist Level: Minimal Assistance - Patient > 75%     Upper Body Dressing/Undressing Upper body dressing   What is the patient wearing?: Pull over shirt    Upper body assist Assist Level: Minimal Assistance - Patient > 75%    Lower Body Dressing/Undressing Lower body dressing      What is the patient wearing?: Incontinence brief, Pants     Lower body assist Assist for lower body dressing: Minimal Assistance - Patient > 75%     Toileting Toileting    Toileting assist Assist for toileting: Moderate Assistance - Patient 50 - 74%     Transfers Chair/bed transfer  Transfers assist     Chair/bed transfer assist level: Moderate Assistance - Patient 50 - 74%     Locomotion Ambulation   Ambulation assist      Assist level: Moderate Assistance - Patient 50 - 74% Assistive device: Walker-rolling Max distance: 5 ft   Walk 10 feet activity   Assist  Walk 10 feet activity did not occur: Safety/medical concerns  Assist level: Moderate Assistance - Patient - 50 - 74% Assistive device: Walker-rolling   Walk 50 feet activity  Assist Walk 50 feet with 2 turns activity did not occur: Safety/medical concerns         Walk 150 feet activity   Assist Walk 150 feet activity did not occur: Safety/medical concerns         Walk 10 feet on uneven surface  activity   Assist Walk 10 feet on uneven surfaces activity did not occur: Safety/medical concerns         Wheelchair     Assist   Type of Wheelchair: Manual    Wheelchair assist level: Supervision/Verbal cueing Max wheelchair distance: 151ft    Wheelchair 50 feet with 2 turns  activity    Assist        Assist Level: Supervision/Verbal cueing   Wheelchair 150 feet activity     Assist Wheelchair 150 feet activity did not occur: Safety/medical concerns         Medical Problem List and Plan: 1.  Functional decline secondary to Pelvic fracture and pain.  Continue CIR 2.  DVT Prophylaxis/Anticoagulation: Pharmaceutical: Lovenox 3. Pain Management: Continue tylenol qid with ultram prn. Wife does not want patient on any narcotics.  4. H/o Anxiety disorder/Mood: Continue Zoloft daily. LCSW to follow for evaluation and support.  5. Neuropsych: This patient is capable of making decisions on his own behalf. 6. Skin/Wound Care: Routine pressure relief measures.  7. Fluids/Electrolytes/Nutrition: Monitor I/O.  8. T2DM: Resumed lower dose Amaryl. Monitor BS ac/hs and use SSI for elevated BS. Added bedtime snack  Labile on 2/14, monitor for trend  Monitor with increased mobility 9. HTN: Monitor BP bid--continue Norvasc at bedtime and Cozaar in am.   Controlled on 2/14  Monitor with increased mobility 10. Acute on chronic renal failure: Encourage fluid intake.   Creatinine 1.70 on 2/13  Encourage fluids  Labs ordered for Monday  Continue to monitor 11.  H/o CVA: On Plavix and Lipitor.  12. ABLA  Hemoglobin 11.6 on 2/13  Continue to monitor 13. Thrombocytopenia:?  Resolved  Platelets 170 on 2/13  Continue to monitor   LOS: 2 days A FACE TO FACE EVALUATION WAS PERFORMED  Ankit Lorie Phenix 06/17/2018, 8:51 AM

## 2018-06-17 NOTE — Progress Notes (Signed)
Physical Therapy Session Note  Patient Details  Name: Tyler [name] MRN: [id number] Date of Birth: 11-07-1928  Today's Date: 06/17/2018 PT Individual Time: 0800-0902 PT Individual Time Calculation (min): 62 min   Short Term Goals: Week 1:  PT Short Term Goal 1 (Week 1): Pt will ambulate 100 ft with RW and min assist PT Short Term Goal 2 (Week 1): Pt will perform sit to stand transfers with supervision PT Short Term Goal 3 (Week 1): Pt will ascend/descend 4 stairs with 2 handrails and min assist  Skilled Therapeutic Interventions/Progress Updates:   Pt received supine in bed and agreeable to PT. Supine>sit transfer with min assist and min cues for control of the LLE. Pt performed dressing at EOB. Pt able to thread pants around  with supervision assist sit<>stand with min assist and pants pulled to waist by PT. Pt able to don shirt sitting EOB. PT donned socks and shoes due to pain in the L hip this AM. Stand pivot transfer to [facility] with mod assist and RW.   WC mobility through hall with supervision assist x 175f; min cues for equal force through BUE to maintain straight path and improved turning technique.   PT instructed pt in gait training 148fx 2 with mod progressing to min assist from PT. Moderate cues fo AD management and improved use of BUE to reduce pain in the LLE.   PT instructed pt in seated UE therex:  Mid row 2x 10  High row 2 x 10  Press up 2  X 5  Bicep curl 2 x 12.  Cues for proper speed and ROM to improved targeted strengthening   Patient returned to room and [facility] in reach and all needs met.         Therapy Documentation Precautions:  Precautions Precautions: Fall Restrictions Weight Bearing Restrictions: No RLE Weight Bearing: Weight bearing as tolerated LLE Weight Bearing: Weight bearing as tolerated Vital Signs: Therapy Vitals Temp: 97.9 F (36.6 C) Temp Source: Oral Pulse Rate: 70 Resp: 16 BP: (!) 120/55 Patient Position  (if appropriate): Lying Oxygen Therapy SpO2: 98 % O2 Device: Room Air Pain: Pain Assessment Pain Scale: 0-10 Pain Score: 8  Pain Type: Acute pain Pain Location: Hip Pain Orientation: Left    Therapy/Group: Individual Therapy  AuLorie Phenix/14/2020, 9:08 AM

## 2018-06-17 NOTE — Progress Notes (Signed)
Occupational Therapy Session Note  Patient Details  Name: JEREMIH DEARMAS MRN: [id number] Date of Birth: 06/21/1928  Today's Date: 06/17/2018 OT Individual Time: 1335-1430 OT Individual Time Calculation (min): 55 min    Short Term Goals: Week 1:  OT Short Term Goal 1 (Week 1): STGs equal to LTGs set at supervision level.    Skilled Therapeutic Interventions/Progress Updates:    1;1. Pt received on toilet. No c/opain. Pt requires A for hygiene. Pants and berief soiled. A provided to doff but pt able to don pants and stand with MOD A d/t posterior bias while advancing patns past hip. Pt completes 2 graded pipe tree activity in standing with MIN A for sit to stand and CGA for static standing balance. Pt requires increased time for problem solving d/t decreased visio-spatial skilles. Exited session with pt setaed in w/c, call light in reach and all needs met  Therapy Documentation Precautions:  Precautions Precautions: Fall Restrictions Weight Bearing Restrictions: No RLE Weight Bearing: Weight bearing as tolerated LLE Weight Bearing: Weight bearing as tolerated General:   Vital Signs: Therapy Vitals Temp: 98.4 F (36.9 C) Temp Source: Oral Pulse Rate: 83 Resp: 17 BP: (!) 140/57 Patient Position (if appropriate): Sitting Oxygen Therapy SpO2: 99 % O2 Device: Room Air Pain: Pain Assessment Pain Scale: 0-10 Pain Score: 3  ADL: ADL Eating: Independent Where Assessed-Eating: Wheelchair Grooming: Setup Where Assessed-Grooming: Wheelchair Upper Body Bathing: Setup Where Assessed-Upper Body Bathing: Wheelchair Lower Body Bathing: Minimal assistance Where Assessed-Lower Body Bathing: Wheelchair Upper Body Dressing: Minimal assistance Where Assessed-Upper Body Dressing: Wheelchair Lower Body Dressing: Minimal assistance Where Assessed-Lower Body Dressing: Wheelchair Toileting: Minimal assistance Where Assessed-Toileting: Bedside Commode Toilet Transfer: Minimal  assistance Armed forces technical officer Method: Counselling psychologist: Art gallery manager    Praxis   Exercises:   Other Treatments:     Therapy/Group: Individual Therapy  Tonny Branch 06/17/2018, 2:18 PM

## 2018-06-17 NOTE — Progress Notes (Signed)
Occupational Therapy Session Note  Patient Details  Name: Tyler [name] MRN: [id number] Date of Birth: July 28, 1928  Today's Date: 06/17/2018 OT Individual Time: 0945-1010 OT Individual Time Calculation (min): 25 min    Short Term Goals: Week 1:  OT Short Term Goal 1 (Week 1): STGs equal to LTGs set at supervision level.    Skilled Therapeutic Interventions/Progress Updates:    Pt received in w/c.  Discussed the challenges he has been having with dressing.  It is too painful for him to bend forward to don pants over R foot.  Used another pair of pants for practice and a reacher for pt to practice donning over and doffing on R foot. He is needing a considerable amount of cues with managing the reacher (figuring out when to clasp and unclasp, how to angle reacher and change positions of reacher).  Pt will need practice.  He was able to eventually get pants over foot.  He will also benefit from a long shoe horn and elastic shoe laces.  Pt resting in w/c with belt alarm on and all needs met.  Therapy Documentation Precautions:  Precautions Precautions: Fall Restrictions Weight Bearing Restrictions: No RLE Weight Bearing: Weight bearing as tolerated LLE Weight Bearing: Weight bearing as tolerated  Pain: Pain Assessment Pain Scale: 0-10 Pain Score: 3  Pain Type: Acute pain Pain Location: Hip Pain Orientation: Left    Therapy/Group: Individual Therapy  [place] 06/17/2018, 12:15 PM

## 2018-06-17 NOTE — Progress Notes (Signed)
Occupational Therapy Session Note  Patient Details  Name: Tyler [name] MRN: [id number] Date of Birth: 1928/07/28  Today's Date: 06/17/2018 OT Individual Time: 1100-1200 OT Individual Time Calculation (min): 60 min   Short Term Goals: Week 1:  OT Short Term Goal 1 (Week 1): STGs equal to LTGs set at supervision level.    Skilled Therapeutic Interventions/Progress Updates:    Pt greeted sitting in wc and agreeable to shower today. Pt able to doff socks and shoes from wc with min A. Pt brought into bathroom and completed stand-pivot to tub bench using grab bars with min A. Pt needed assistance for LB bathing, but able to wash upper body with set-up A. Worked on LB dressing strategies, initially using reacher, but pt had difficulty problem solving use despite cues. Attempted threading pant legs without reacher and was successful. Min A sit<>stand w/ CGA for balance when pulling up pants. Pt also able to don socks and shoes with increased time and supervision. Worked on sit<>stand and standing balance/endurance within standing hair brushing task. Pt left seated in wc at end of session with lunch set-up, alarm belt on, and spouse present.   Therapy Documentation Precautions:  Precautions Precautions: Fall Restrictions Weight Bearing Restrictions: No RLE Weight Bearing: Weight bearing as tolerated LLE Weight Bearing: Weight bearing as tolerated Pain: Pain Assessment Pain Scale: 0-10 Pain Score: 3  Pain Type: Acute pain Pain Location: Hip Pain Orientation: Left  Repositioned:rest  Therapy/Group: Individual Therapy  Valma Cava 06/17/2018, 12:06 PM

## 2018-06-18 ENCOUNTER — Inpatient Hospital Stay (HOSPITAL_COMMUNITY): Payer: Self-pay

## 2018-06-18 ENCOUNTER — Inpatient Hospital Stay (HOSPITAL_COMMUNITY): Payer: Self-pay | Admitting: Physical Therapy

## 2018-06-18 DIAGNOSIS — Z8781 Personal history of (healed) traumatic fracture: Secondary | ICD-10-CM

## 2018-06-18 LAB — GLUCOSE, CAPILLARY
GLUCOSE-CAPILLARY: 168 mg/dL — AB (ref 70–99)
Glucose-Capillary: 122 mg/dL — ABNORMAL HIGH (ref 70–99)
Glucose-Capillary: 108 mg/dL — ABNORMAL HIGH (ref 70–99)
Glucose-Capillary: 158 mg/dL — ABNORMAL HIGH (ref 70–99)

## 2018-06-18 NOTE — Progress Notes (Signed)
Occupational Therapy Session Note  Patient Details  Name: Tyler [name] MRN: [id number] Date of Birth: Dec 15, 1928  Today's Date: 06/18/2018 OT Individual Time: 6384-5364 OT Individual Time Calculation (min): 72 min    Short Term Goals: Week 1:  OT Short Term Goal 1 (Week 1): STGs equal to LTGs set at supervision level.    Skilled Therapeutic Interventions/Progress Updates:    1:1. Pt reporting medication for pain making him "foggy" and dizzy. Vitals WNL. Pt requesting to shave. Pt requires VC for sequencing wetting face with washcloth and applying shaving cream. OT shave pt face for safety d/t decreased coordination in BUE. Pt completes w/c mobility with  S overall to dayroom for BUE endurance. Pt completes BUE therex 1x15 with 3 # dowel rod for BUE strengthening/endurance required for BADLs and transfers: shoulder felx/ext, elbow flex/ext, wrist flex/ext, shoulder press and chest press. Pt stands to complete no sew blanket with S for static standing balance and VC for sequencing fastening knots for [facility]. Pt able to stand ~15 min prior to needing break. Exited session with pt seated n w/c, call light in reach, belt alarm and all needs met.   Therapy Documentation Precautions:  Precautions Precautions: Fall Restrictions Weight Bearing Restrictions: Yes RLE Weight Bearing: Weight bearing as tolerated LLE Weight Bearing: Weight bearing as tolerated General:     Therapy/Group: Individual Therapy  Tonny Branch 06/18/2018, 12:09 PM

## 2018-06-18 NOTE — Progress Notes (Signed)
Physical Therapy Session Note  Patient Details  Name: Tyler [name] MRN: [id number] Date of Birth: [birth year]  Today's Date: 06/18/2018 PT Individual Time: 1605-1700 PT Individual Time Calculation (min): 55 min   Short Term Goals: Week 1:  PT Short Term Goal 1 (Week 1): Pt will ambulate 100 ft with RW and min assist PT Short Term Goal 2 (Week 1): Pt will perform sit to stand transfers with supervision PT Short Term Goal 3 (Week 1): Pt will ascend/descend 4 stairs with 2 handrails and min assist  Skilled Therapeutic Interventions/Progress Updates:   Pt received sitting in WC and agreeable to PT. Pt transported to day room in [facility].   UBE 5 min forward/3 min reverse. Level 5.min cues for increased speed. Gait training 2f + 443fwith RW and min assist for safety as well as cues for increased step length as tolerated on the R.   WC mobility [id number] supervision assist from PT for doorway management and obstacle navigation in tight space of day room. .   Standing therex for RLE AROM: HS curls. Hip flexion. Hip abduction/adduction. Hip extension. All compelted x 10 with BUE support on RW.  Sit<>stand 2 x 5 with BUE push from Arm rests. Min cues to remain in pain free ROM and safety.    Patient returned to room and [facility] reach and all needs met.        Therapy Documentation Precautions:  Precautions Precautions: Fall Restrictions Weight Bearing Restrictions: Yes RLE Weight Bearing: Weight bearing as tolerated LLE Weight Bearing: Weight bearing as tolerated Vital Signs: Therapy Vitals Temp: 98 F (36.7 C) Temp Source: Oral Pulse Rate: 79 Resp: 19 BP: (!) 133/59 Patient Position (if appropriate): Sitting Oxygen Therapy SpO2: 100 % O2 Device: Room Air Pain:   0/10 at rest.    Therapy/Group: Individual Therapy  AustLorie Phenix5/2020, 4:57 PM

## 2018-06-18 NOTE — Plan of Care (Signed)
  Problem: RH BOWEL ELIMINATION Goal: RH STG MANAGE BOWEL WITH ASSISTANCE Description STG Manage Bowel with Min Assistance.  Outcome: Progressing Goal: RH STG MANAGE BOWEL W/MEDICATION W/ASSISTANCE Description STG Manage Bowel with Medication with min.Assistance.  Outcome: Progressing   Problem: RH BLADDER ELIMINATION Goal: RH STG MANAGE BLADDER WITH ASSISTANCE Description STG Manage Bladder With Min Assistance  Outcome: Progressing Goal: RH STG MANAGE BLADDER WITH EQUIPMENT WITH ASSISTANCE Description STG Manage Bladder With Equipment  With min. Assistance   Outcome: Progressing   Problem: RH SKIN INTEGRITY Goal: RH STG SKIN FREE OF INFECTION/BREAKDOWN Description With min. Assist.  Outcome: Progressing Goal: RH STG MAINTAIN SKIN INTEGRITY WITH ASSISTANCE Description STG Maintain Skin Integrity  With min. Assistance.   Outcome: Progressing Goal: RH STG ABLE TO PERFORM INCISION/WOUND CARE W/ASSISTANCE Description STG Able To Perform Incision/Wound Care With min.Assistance.  Outcome: Progressing   Problem: RH SAFETY Goal: RH STG ADHERE TO SAFETY PRECAUTIONS W/ASSISTANCE/DEVICE Description STG Adhere to Safety Precautions With Min Assistance/Device.  Outcome: Progressing   Problem: RH PAIN MANAGEMENT Goal: RH STG PAIN MANAGED AT OR BELOW PT'S PAIN GOAL Description Patient will be pain free or less than 3  Outcome: Progressing   Problem: RH KNOWLEDGE DEFICIT GENERAL Goal: RH STG INCREASE KNOWLEDGE OF SELF CARE AFTER HOSPITALIZATION Outcome: Progressing

## 2018-06-18 NOTE — Progress Notes (Signed)
[place] PHYSICAL MEDICINE & REHABILITATION PROGRESS NOTE  Subjective/Complaints: Having some generalized pelvic pain especially with activity. Tylenol helps "some"  ROS: Patient denies fever, rash, sore throat, blurred vision, nausea, vomiting, diarrhea, cough, shortness of breath or chest pain,   headache, or mood change.    Objective: Vital Signs: Blood pressure (!) 147/71, pulse 66, temperature 98.7 F (37.1 C), temperature source Oral, resp. rate 14, height 5\' 11"  (1.803 m), weight 75.5 kg, SpO2 93 %. No results found. Recent Labs    06/16/18 0728  WBC 9.1  HGB 11.6*  HCT 36.6*  PLT 170   Recent Labs    06/16/18 0728  NA 138  K 4.5  CL 106  CO2 23  GLUCOSE 134*  BUN 35*  CREATININE 1.70*  CALCIUM 9.4    Physical Exam: BP (!) 147/71 (BP Location: Right Arm)   Pulse 66   Temp 98.7 F (37.1 C) (Oral)   Resp 14   Ht 5\' 11"  (1.803 m)   Wt 75.5 kg   SpO2 93%   BMI 23.21 kg/m  Constitutional: No distress . Vital signs reviewed. HEENT: EOMI, oral membranes moist Neck: supple Cardiovascular: RRR without murmur. [name]    Respiratory: CTA Bilaterally without wheezes or rales. Normal effort    GI: BS +, non-tender, non-distended  Musc: Tenderness to right hip, pelvis Neurological: He is alert and oriented.   HOH.  ?mild processing delays Motor: Right upper extremity: 4+/5 Left upper extremity: 5/5 Right lower extremity: 4/5 ongoing pain inhibition Skin: Warm and dry.  Intact. Psychiatric: pleasant and appropriate   Assessment/Plan: 1. Functional deficits secondary to pelvic fracture which require 3+ hours per day of interdisciplinary therapy in a comprehensive inpatient rehab setting.  Physiatrist is providing close team supervision and 24 hour management of active medical problems listed below.  Physiatrist and rehab team continue to assess barriers to discharge/monitor patient progress toward functional and medical goals  Care Tool:  Bathing     Body parts bathed by patient: Right arm, Left arm, Chest, Abdomen, Front perineal area, Buttocks, Face, Right lower leg, Left upper leg, Right upper leg         Bathing assist Assist Level: Minimal Assistance - Patient > 75%     Upper Body Dressing/Undressing Upper body dressing   What is the patient wearing?: Pull over shirt    Upper body assist Assist Level: Minimal Assistance - Patient > 75%    Lower Body Dressing/Undressing Lower body dressing      What is the patient wearing?: Incontinence brief, Pants     Lower body assist Assist for lower body dressing: Minimal Assistance - Patient > 75%     Toileting Toileting    Toileting assist Assist for toileting: Moderate Assistance - Patient 50 - 74%     Transfers Chair/bed transfer  Transfers assist     Chair/bed transfer assist level: Moderate Assistance - Patient 50 - 74%     Locomotion Ambulation   Ambulation assist      Assist level: Moderate Assistance - Patient 50 - 74% Assistive device: Walker-rolling Max distance: 15   Walk 10 feet activity   Assist  Walk 10 feet activity did not occur: Safety/medical concerns  Assist level: Moderate Assistance - Patient - 50 - 74% Assistive device: Walker-rolling   Walk 50 feet activity   Assist Walk 50 feet with 2 turns activity did not occur: Safety/medical concerns         Walk 150 feet activity  Assist Walk 150 feet activity did not occur: Safety/medical concerns         Walk 10 feet on uneven surface  activity   Assist Walk 10 feet on uneven surfaces activity did not occur: Safety/medical concerns         Wheelchair     Assist   Type of Wheelchair: Manual    Wheelchair assist level: Supervision/Verbal cueing Max wheelchair distance: 180    Wheelchair 50 feet with 2 turns activity    Assist        Assist Level: Supervision/Verbal cueing   Wheelchair 150 feet activity     Assist Wheelchair 150 feet  activity did not occur: Safety/medical concerns   Assist Level: Supervision/Verbal cueing     Medical Problem List and Plan: 1.  Functional decline secondary to Pelvic fracture and pain.  Continue CIR 2.  DVT Prophylaxis/Anticoagulation: Pharmaceutical: Lovenox 3. Pain Management: Continue tylenol qid with ultram prn. Wife does not want patient on any narcotics.   -pt working through the pain as much as he can 4. H/o Anxiety disorder/Mood: Continue Zoloft daily. LCSW to follow for evaluation and support.  5. Neuropsych: This patient is capable of making decisions on his own behalf. 6. Skin/Wound Care: Routine pressure relief measures.  7. Fluids/Electrolytes/Nutrition: Monitor I/O.  8. T2DM: Resumed lower dose Amaryl. Monitor BS ac/hs and use SSI for elevated BS. Added bedtime snack  Fair control to borderline. No changes at present    9. HTN: Monitor BP bid--continue Norvasc at bedtime and Cozaar in am.   Controlled on 2/15  Monitor with increased mobility 10. Acute on chronic renal failure: Encourage fluid intake.   Creatinine 1.70 on 2/13  Encourage fluids  Labs ordered for Monday  Continue to monitor 11.  H/o CVA: On Plavix and Lipitor.  12. ABLA  Hemoglobin 11.6 on 2/13  Continue to monitor 13. Thrombocytopenia:?  Resolved  Platelets 170 on 2/13  Continue to monitor   LOS: 3 days A FACE TO [place] 06/18/2018, 8:43 AM

## 2018-06-18 NOTE — Progress Notes (Signed)
Physical Therapy Session Note  Patient Details  Name: Tyler [name] MRN: [id number] Date of Birth: 01/08/1929  Today's Date: 06/18/2018 PT Individual Time: 0920-1015 PT Individual Time Calculation (min): 55 min   Short Term Goals: Week 1:  PT Short Term Goal 1 (Week 1): Pt will ambulate 100 ft with RW and min assist PT Short Term Goal 2 (Week 1): Pt will perform sit to stand transfers with supervision PT Short Term Goal 3 (Week 1): Pt will ascend/descend 4 stairs with 2 handrails and min assist  Skilled Therapeutic Interventions/Progress Updates:   Pt received sitting in WC and agreeable to PT. PT isntructed pt in WC mobility x 260f with supervision and min cues for turning technique and equal use of BUE to maintain straight path.   Stand pivot transfers to various surfaces with Min assist from PT, and min cues for improved WB through BUE while standing on the R.   Nustep endurance training for improved AAROM in the R hip, 6 min + 3 min, level 4>6; min cues for proper speed and to remain in pain free ROM.   Standing balance/tolerance x 4 minutes to complete and disassemble peg board puzzle of moderate diffiuclty. Min-supervision assist from PT for safety as well as intermittent 1-0 UE support.   Gait training with RW and min assist from PT x 465fwith min cues for improved use of BUE in RLE stance. Pt noted to have decreased pain with R steppage gait compared to swing through.   [facility] returned to [facility] and [facility] reach and all needs met.         Therapy Documentation Precautions:  Precautions Precautions: Fall Restrictions Weight Bearing Restrictions: No RLE Weight Bearing: Weight bearing as tolerated LLE Weight Bearing: Weight bearing as tolerated   Pain: 2/10 at rest in R hip. Movement increased.    Therapy/Group: Individual Therapy  AuLorie Phenix/15/2020, 10:18 AM

## 2018-06-19 ENCOUNTER — Inpatient Hospital Stay (HOSPITAL_COMMUNITY): Payer: Self-pay

## 2018-06-19 LAB — GLUCOSE, CAPILLARY
GLUCOSE-CAPILLARY: 130 mg/dL — AB (ref 70–99)
Glucose-Capillary: 113 mg/dL — ABNORMAL HIGH (ref 70–99)
Glucose-Capillary: 241 mg/dL — ABNORMAL HIGH (ref 70–99)
Glucose-Capillary: 104 mg/dL — ABNORMAL HIGH (ref 70–99)

## 2018-06-19 NOTE — Progress Notes (Signed)
[place] PHYSICAL MEDICINE & REHABILITATION PROGRESS NOTE  Subjective/Complaints: Patient overall doing fairly well.  Having some continued pelvic pain which she is working through.  Did have some problems falling asleep again after having vitals done this morning just after 4 AM.  ROS: Patient denies fever, rash, sore throat, blurred vision, nausea, vomiting, diarrhea, cough, shortness of breath or chest pain,  headache, or mood change.    Objective: Vital Signs: Blood pressure (!) 146/63, pulse 68, temperature 97.9 F (36.6 C), temperature source Oral, resp. rate 16, height 5\' 11"  (1.803 m), weight 75 kg, SpO2 96 %. No results found. No results for input(s): WBC, HGB, HCT, PLT in the last 72 hours. No results for input(s): NA, K, CL, CO2, GLUCOSE, BUN, CREATININE, CALCIUM in the last 72 hours.  Physical Exam: BP (!) 146/63 (BP Location: Right Arm)   Pulse 68   Temp 97.9 F (36.6 C) (Oral)   Resp 16   Ht 5\' 11"  (1.803 m)   Wt 75 kg   SpO2 96%   BMI 23.06 kg/m  Constitutional: No distress . Vital signs reviewed. HEENT: EOMI, oral membranes moist Neck: supple Cardiovascular: RRR without murmur. [name]    Respiratory: CTA Bilaterally without wheezes or rales. Normal effort    GI: BS +, non-tender, non-distended  Musc: Tenderness to right hip, pelvis Neurological: He is alert and oriented.   HOH.  ?mild processing delays--no changes Motor: Right upper extremity: 4+/5 Left upper extremity: 5/5 Right lower extremity: 4/5 ongoing pain inhibition Skin: Warm and dry.  Intact. Psychiatric: Very pleasant  Assessment/Plan: 1. Functional deficits secondary to pelvic fracture which require 3+ hours per day of interdisciplinary therapy in a comprehensive inpatient rehab setting.  Physiatrist is providing close team supervision and 24 hour management of active medical problems listed below.  Physiatrist and rehab team continue to assess barriers to discharge/monitor patient  progress toward functional and medical goals  Care Tool:  Bathing    Body parts bathed by patient: Right arm, Left arm, Chest, Abdomen, Front perineal area, Buttocks, Face, Right lower leg, Left upper leg, Right upper leg         Bathing assist Assist Level: Minimal Assistance - Patient > 75%     Upper Body Dressing/Undressing Upper body dressing   What is the patient wearing?: Pull over shirt    Upper body assist Assist Level: Minimal Assistance - Patient > 75%    Lower Body Dressing/Undressing Lower body dressing      What is the patient wearing?: Incontinence brief, Pants     Lower body assist Assist for lower body dressing: Minimal Assistance - Patient > 75%     Toileting Toileting    Toileting assist Assist for toileting: Moderate Assistance - Patient 50 - 74%     Transfers Chair/bed transfer  Transfers assist     Chair/bed transfer assist level: Moderate Assistance - Patient 50 - 74%     Locomotion Ambulation   Ambulation assist      Assist level: Minimal Assistance - Patient > 75% Assistive device: Walker-rolling Max distance: 40   Walk 10 feet activity   Assist  Walk 10 feet activity did not occur: Safety/medical concerns  Assist level: Minimal Assistance - Patient > 75% Assistive device: Walker-rolling   Walk 50 feet activity   Assist Walk 50 feet with 2 turns activity did not occur: Safety/medical concerns         Walk 150 feet activity   Assist Walk 150 feet  activity did not occur: Safety/medical concerns         Walk 10 feet on uneven surface  activity   Assist Walk 10 feet on uneven surfaces activity did not occur: Safety/medical concerns         Wheelchair     Assist   Type of Wheelchair: Manual    Wheelchair assist level: Supervision/Verbal cueing Max wheelchair distance: 200    Wheelchair 50 feet with 2 turns activity    Assist        Assist Level: Supervision/Verbal cueing    Wheelchair 150 feet activity     Assist Wheelchair 150 feet activity did not occur: Safety/medical concerns   Assist Level: Supervision/Verbal cueing     Medical Problem List and Plan: 1.  Functional decline secondary to Pelvic fracture and pain.  Continue CIR PT and OT 2.  DVT Prophylaxis/Anticoagulation: Pharmaceutical: Lovenox 3. Pain Management: Continue tylenol qid with ultram prn. Wife does not want patient on any narcotics.   -pt working through the pain as much as he can 4. H/o Anxiety disorder/Mood: Continue Zoloft daily. LCSW to follow for evaluation and support.  5. Neuropsych: This patient is capable of making decisions on his own behalf. 6. Skin/Wound Care: Routine pressure relief measures.  7. Fluids/Electrolytes/Nutrition: Monitor I/O.  8. T2DM: Resumed lower dose Amaryl. Monitor BS ac/hs and use SSI for elevated BS. Added bedtime snack  Reasonable control to borderline 2/16    9. HTN: Monitor BP bid--continue Norvasc at bedtime and Cozaar in am.   Controlled on 2/16  Monitor with increased mobility 10. Acute on chronic renal failure: Encourage fluid intake.   Creatinine 1.70 on 2/13  Encourage fluids  Labs ordered for Monday  Continue to monitor 11.  H/o CVA: On Plavix and Lipitor.  12. ABLA  Hemoglobin 11.6 on 2/13  Continue to monitor 13. Thrombocytopenia:?  Resolved  Platelets 170 on 2/13  Continue to monitor   LOS: 4 days A FACE TO FACE EVALUATION WAS PERFORMED  Meredith Staggers 06/19/2018, 8:11 AM

## 2018-06-19 NOTE — Progress Notes (Signed)
  Physical Therapy Session Note  Patient Details  Name: Tyler [name] MRN: [id number] Date of Birth: Jan 30, 1929  Today's Date: 06/19/2018 PT Individual Time: 1100-1210 PT Individual Time Calculation (min): 70 min   Short Term Goals: Week 1:  PT Short Term Goal 1 (Week 1): Pt will ambulate 100 ft with RW and min assist PT Short Term Goal 2 (Week 1): Pt will perform sit to stand transfers with supervision PT Short Term Goal 3 (Week 1): Pt will ascend/descend 4 stairs with 2 handrails and min assist  Skilled Therapeutic Interventions/Progress Updates:   Pt resting in bed with family present.    In flat bed, without rails, rolling L and sitting up (per home set-up) with superivsion and extra time.  Pt sat EOB and donned bil shoes with extra time.    Stand pivot transfer bed> w/c to R with min guard assist.  strengthening exs, in sitting 10 x 1 R/L long arc quad knee ext, R/L marching, 20 x 1  heel/toe raises, bil adductor squeezes without back support for core activation.  Gait training on level tile with RW, min assist.  Pt demonstrates R hip retraction and minimal R foot clearance; min cueing to increase R hip flexion for safe foot clearance.  After cueing, pt clears r foot with increased hip and knee flexion, but this fades after 2-3 steps.     Gait trials with posterior leaf spring AFO and ACE wrap for r foot drop, with minimal improvement.  Pt may benefit from Foot Up brace, but unavailable during this session for trial.  PT discussed foot drop and poor clearance at length with pt, wife and son. Family reports a hx of poor R foot clearance for years.  Pt has had 2 serious falls with fxs, including current pelvic fxs, and R femur fx 2017; son believes they may have been due to poor R foot clearance.  PT explained options for bracing and R shoe toe cap. Family and pt are very supportive of anything that would help.  Pt would benefit from assessment by Brooke Pace, CPO.  Pt left resting  in w/c with needs at hand and seat belt alarm set.  PT recommended that pt ask to get into recliner in an hour or 2.     Therapy Documentation Precautions:  Precautions Precautions: Fall Restrictions Weight Bearing Restrictions: No RLE Weight Bearing: Weight bearing as tolerated LLE Weight Bearing: Weight bearing as tolerated  Pain: Pain Assessment Pain Scale: 0-10 Pain Score: 5/10 R hip, with gait Pain Intervention(s): Medication (See eMAR)(scheduled)    Therapy/Group: Individual Therapy  Brizza Nathanson 06/19/2018, 12:22 PM

## 2018-06-20 ENCOUNTER — Inpatient Hospital Stay (HOSPITAL_COMMUNITY): Payer: Self-pay | Admitting: Occupational Therapy

## 2018-06-20 ENCOUNTER — Inpatient Hospital Stay (HOSPITAL_COMMUNITY): Payer: Self-pay

## 2018-06-20 ENCOUNTER — Inpatient Hospital Stay (HOSPITAL_COMMUNITY): Payer: PPO

## 2018-06-20 ENCOUNTER — Inpatient Hospital Stay (HOSPITAL_COMMUNITY): Payer: Self-pay | Admitting: Physical Therapy

## 2018-06-20 DIAGNOSIS — N183 Chronic kidney disease, stage 3 unspecified: Secondary | ICD-10-CM

## 2018-06-20 DIAGNOSIS — I10 Essential (primary) hypertension: Secondary | ICD-10-CM

## 2018-06-20 DIAGNOSIS — R7309 Other abnormal glucose: Secondary | ICD-10-CM | POA: Insufficient documentation

## 2018-06-20 DIAGNOSIS — R42 Dizziness and giddiness: Secondary | ICD-10-CM

## 2018-06-20 DIAGNOSIS — R200 Anesthesia of skin: Secondary | ICD-10-CM

## 2018-06-20 LAB — URINALYSIS, COMPLETE (UACMP) WITH MICROSCOPIC
Bacteria, UA: NONE SEEN
Bilirubin Urine: NEGATIVE
Glucose, UA: NEGATIVE mg/dL
KETONES UR: NEGATIVE mg/dL
Leukocytes,Ua: NEGATIVE
Nitrite: NEGATIVE
Protein, ur: NEGATIVE mg/dL
Specific Gravity, Urine: 1.015 (ref 1.005–1.030)
pH: 5 (ref 5.0–8.0)

## 2018-06-20 LAB — TROPONIN I: Troponin I: <0.03 ng/mL (ref <0.03)

## 2018-06-20 LAB — GLUCOSE, CAPILLARY
Glucose-Capillary: 112 mg/dL — ABNORMAL HIGH (ref 70–99)
Glucose-Capillary: 136 mg/dL — ABNORMAL HIGH (ref 70–99)
Glucose-Capillary: 138 mg/dL — ABNORMAL HIGH (ref 70–99)
Glucose-Capillary: 151 mg/dL — ABNORMAL HIGH (ref 70–99)

## 2018-06-20 NOTE — Progress Notes (Signed)
Occupational Therapy Session Note  Patient Details  Name: Tyler [name] MRN: [id number] Date of Birth: May 29, 1928  Today's Date: 06/20/2018 OT Individual Time: 1415-1445 OT Individual Time Calculation (min): 30 min    Short Term Goals: Week 1:  OT Short Term Goal 1 (Week 1): STGs equal to LTGs set at supervision level.    Skilled Therapeutic Interventions/Progress Updates:    Pt received sitting up in recliner with wife present, no initial c/o pain. Pt completed SPT to w/c with min A. Pt reported pain as described below during transfers. Pt was transported to therapy gym where he transferred to mat with min A. Pt stood from [facility] with no UE support with CGA. He held 3lb dowel and completed B UE strengthening circuit in standing to challenge core stabilization and balance. CGA provided throughout, as well as cueing for UE placement and technique. Pt completed 2 more sets with increasing weight to grade activity up. Pt was transported back to room where he completed 10 ft of functional mobility with CGA to toilet. Pt was left sitting on BSC over toilet with wife present and NT aware.   Therapy Documentation Precautions:  Precautions Precautions: Fall Restrictions Weight Bearing Restrictions: No RLE Weight Bearing: Weight bearing as tolerated LLE Weight Bearing: Weight bearing as tolerated Pain: Pain Assessment Pain Scale: 0-10 Pain Score: 6  Pain Type: Acute pain Pain Location: Pelvis Pain Orientation: Right Pain Descriptors / Indicators: Aching;Sore Pain Onset: With Activity Pain Intervention(s): Ambulation/increased activity;Rest   Therapy/Group: Individual Therapy  Curtis Sites 06/20/2018, 3:20 PM

## 2018-06-20 NOTE — Progress Notes (Signed)
RN called to patient's room by NT to assess patient. On arrival, patient stated his legs are weak and numb and his head does not feel right. Patient denies chest pain or shortness of breath. Vital signs WNL. Patient was able to answer orientation questions and able to follow commands. Pupils equally reactive to light. Bilateral upper extremities can move against resistance. Bilateral lower extremities can move against gravity but not resistance. Blood sugar check was 112. Patient stated that he did not sleep well since he was cleaned for incontinence around 3am. Charge RN informed and reassessed patient. Dan Anguillu informed. Will continue to monitor.

## 2018-06-20 NOTE — Progress Notes (Signed)
[facility] PHYSICAL MEDICINE & REHABILITATION PROGRESS NOTE  Subjective/Complaints: Patient seen sitting up in bed this morning.  He states he slept well overnight, however called by nursing this morning regarding patient's complaint of lower extremity weakness.  Patient states that he feels like he is dying.  He states that he feels like he had a stroke.  He states he feels numb all over and weak all over with shaking.  ROS: + Dizziness, numbness, weakness.  Denies SOB, N/V/D  Objective: Vital Signs: Blood pressure (!) 170/63, pulse 79, temperature 98.1 F (36.7 C), temperature source Oral, resp. rate (!) 22, height 5\' 11"  (1.803 m), weight 74.4 kg, SpO2 97 %. No results found. No results for input(s): WBC, HGB, HCT, PLT in the last 72 hours. No results for input(s): NA, K, CL, CO2, GLUCOSE, BUN, CREATININE, CALCIUM in the last 72 hours.  Physical Exam: BP (!) 170/63 (BP Location: Left Arm)   Pulse 79   Temp 98.1 F (36.7 C) (Oral)   Resp (!) 22   Ht 5\' 11"  (1.803 m)   Wt 74.4 kg   SpO2 97%   BMI 22.88 kg/m  Constitutional: No distress . Vital signs reviewed. HENT: Normocephalic.  Atraumatic. Eyes: EOMI. No discharge. Cardiovascular: RRR. [name]. Respiratory: CTA Bilaterally. Normal effort. GI: BS +. Non-distended. Musc: Tenderness to right hip, pelvis Neurological: He is alert and oriented.  HOH ?mild processing delays--no changes Motor: Right upper extremity: 4+/5 Left upper extremity: 5/5 Right lower extremity: 4-4+/5  Left lower extremity: 5/5 Tremors in bilateral upper extremities resolved with tasks Subjective numbness diffusely Skin: Warm and dry.  Intact. Psychiatric: Anxious  Assessment/Plan: 1. Functional deficits secondary to pelvic fracture which require 3+ hours per day of interdisciplinary therapy in a comprehensive inpatient rehab setting.  Physiatrist is providing close team supervision and 24 hour management of active medical problems listed  below.  Physiatrist and rehab team continue to assess barriers to discharge/monitor patient progress toward functional and medical goals  Care Tool:  Bathing    Body parts bathed by patient: Right arm, Left arm, Chest, Abdomen, Front perineal area, Buttocks, Face, Right lower leg, Left upper leg, Right upper leg         Bathing assist Assist Level: Minimal Assistance - Patient > 75%     Upper Body Dressing/Undressing Upper body dressing   What is the patient wearing?: Pull over shirt    Upper body assist Assist Level: Minimal Assistance - Patient > 75%    Lower Body Dressing/Undressing Lower body dressing      What is the patient wearing?: Incontinence brief, Pants     Lower body assist Assist for lower body dressing: Minimal Assistance - Patient > 75%     Toileting Toileting    Toileting assist Assist for toileting: Moderate Assistance - Patient 50 - 74%     Transfers Chair/bed transfer  Transfers assist     Chair/bed transfer assist level: Minimal Assistance - Patient > 75%     Locomotion Ambulation   Ambulation assist      Assist level: Minimal Assistance - Patient > 75% Assistive device: Walker-rolling Max distance: 80   Walk 10 feet activity   Assist  Walk 10 feet activity did not occur: Safety/medical concerns  Assist level: Minimal Assistance - Patient > 75% Assistive device: Walker-rolling   Walk 50 feet activity   Assist Walk 50 feet with 2 turns activity did not occur: Safety/medical concerns  Assist level: Minimal Assistance - Patient >  75% Assistive device: Walker-rolling    Walk 150 feet activity   Assist Walk 150 feet activity did not occur: Safety/medical concerns         Walk 10 feet on uneven surface  activity   Assist Walk 10 feet on uneven surfaces activity did not occur: Safety/medical concerns         Wheelchair     Assist   Type of Wheelchair: Manual    Wheelchair assist level:  Supervision/Verbal cueing Max wheelchair distance: 200    Wheelchair 50 feet with 2 turns activity    Assist        Assist Level: Supervision/Verbal cueing   Wheelchair 150 feet activity     Assist Wheelchair 150 feet activity did not occur: Safety/medical concerns   Assist Level: Supervision/Verbal cueing     Medical Problem List and Plan: 1.  Functional decline secondary to Pelvic fracture and pain.  Continue CIR   Patient with complaints of stroke, with history of stroke- CT head, ECG, troponin, UA ordered.  Labs pending. 2.  DVT Prophylaxis/Anticoagulation: Pharmaceutical: Lovenox 3. Pain Management: Continue tylenol qid with ultram prn. Wife does not want patient on any narcotics.   Pt working through the pain as much as he can 4. H/o Anxiety disorder/Mood: Continue Zoloft daily. LCSW to follow for evaluation and support.  5. Neuropsych: This patient is capable of making decisions on his own behalf. 6. Skin/Wound Care: Routine pressure relief measures.  7. Fluids/Electrolytes/Nutrition: Monitor I/O.  8. T2DM: Resumed lower dose Amaryl. Monitor BS ac/hs and use SSI for elevated BS. Added bedtime snack  Labile on 2/17 9. HTN: Monitor BP bid--continue Norvasc at bedtime and Cozaar in am.   Elevated on 2/17  Consider further increase tomorrow if persistently elevated after episode today  Monitor with increased mobility 10. Acute on chronic renal failure: Encourage fluid intake.   Creatinine 1.70 on 2/13  Labs pending  Encourage fluids  Continue to monitor 11.  H/o CVA: On Plavix and Lipitor.  12. ABLA  Hemoglobin 11.6 on 2/13  Continue to monitor 13. Thrombocytopenia:?  Resolved  Platelets 170 on 2/13  Continue to monitor   LOS: 5 days A FACE TO FACE EVALUATION WAS PERFORMED   Lorie Phenix 06/20/2018, 8:04 AM

## 2018-06-20 NOTE — Progress Notes (Signed)
Physical Therapy Session Note  Patient Details  Name: DYMOND SPREEN MRN: [id number] Date of Birth: 02/26/1929  Today's Date: 06/20/2018 PT Individual Time: 1000-1100 and 1301-1335  PT Individual Time Calculation (min): 60 min  And 34 min  Short Term Goals: Week 1:  PT Short Term Goal 1 (Week 1): Pt will ambulate 100 ft with RW and min assist PT Short Term Goal 2 (Week 1): Pt will perform sit to stand transfers with supervision PT Short Term Goal 3 (Week 1): Pt will ascend/descend 4 stairs with 2 handrails and min assist  Skilled Therapeutic Interventions/Progress Updates: Tx1:  Pt presented in bed returning from CT scan with pt agreeable and cleared for therapy. Pt denies pain at rest however c/o increased pain during session with wt bearing activities during session. Pt noted to be incontinent of brief (urine) as pt requested to use urinal prior to OOB. Pt then performed supine to sit with HOB slightly elevated and use of bed rail. Pt performed STS at EOB with RW with PTA providing total A for peri care and maxA for brief and clothing management. Pt then performed stand pivot transfer to w/c CGA and increased time. Pt propelled to rehab gym supervision level and performed stand pivot transfer to mat CGA. Pt participated in seated and standing LE therex as follows:  Seated:  Ankle pumps, LAQ, hip adduction isometrics x 10 bilaterally Standing:  SLR, toe taps to 2in step, standing march x 10 bilaterally Pt required intermittent breaks due to increased pain and fatigue which resolved with rest.  Performed ambulatory transfer to w/c CGA and propelled back to room supervision level and performed ambulatory transfer to recliner. Pt left in recliner at end of session with belt alarm on, call bell within reach and needs met.   Tx2:  Pt presented in bed with wife and family present agreeable to therapy. Pt denies pain throughout session. Pt performed supine to sit with CGA and increased time with use  of bed features. Pt donned socks/shoes at EOB with set up and increased time. Performed ambulatory transfer to w/c CGA. Pt propelled to rehab gym supervision level. PTA donned foot up brace as follow up to therapy session yesterday. Pt ambulated 26f with RW and CGA fading to minA with fatigue. Pt noted to have good foot clearance throughout ambulation with PTA providing verbal cues for increasing step length. Pt verbalizing that he feels that he felt a difference with brace on. Will trial again tomorrow with increased ambulation distance to monitor foot clearance. Upon return to w/c pt propelled back to room and performed ambulatory transfer to recliner with wife observing pt. Pt left in recliner with belt alarm on, call bell within reach and needs met.      Therapy Documentation Precautions:  Precautions Precautions: Fall Restrictions Weight Bearing Restrictions: No RLE Weight Bearing: Weight bearing as tolerated LLE Weight Bearing: Weight bearing as tolerated General:   Vital Signs: Therapy Vitals Temp: 97.6 F (36.4 C) Temp Source: Oral Pulse Rate: 78 Resp: 17 BP: (!) 137/56 Patient Position (if appropriate): Sitting Oxygen Therapy SpO2: 98 % O2 Device: Room Air   Therapy/Group: Individual Therapy  Garrette Caine  Averee Harb, PTA  06/20/2018, 3:09 PM

## 2018-06-20 NOTE — Progress Notes (Signed)
Occupational Therapy Session Note  Patient Details  Name: Tyler [name] MRN: [id number] Date of Birth: [birth year]  Today's Date: 06/20/2018 OT Individual Time: 7939-0300 OT Individual Time Calculation (min): 40 min    Short Term Goals: Week 1:  OT Short Term Goal 1 (Week 1): STGs equal to LTGs set at supervision level.    Skilled Therapeutic Interventions/Progress Updates:    Patient in bed upon arrival, nursing present.  Patient reports not sleeping well last night and c/o of weakness, dizziness.   MD aware of complaints and ordering assessments.  Patient is alert and talkative, moving all extremities..  Completed bed bath with min A after set up, he is able to don OH shirt with min A, max A to don clean brief.    Did not get OOB or complete LB dressing due to being taken to CT.  Will attempt to make up missed time later today pending MD orders.    Therapy Documentation Precautions:  Precautions Precautions: Fall Restrictions Weight Bearing Restrictions: No RLE Weight Bearing: Weight bearing as tolerated LLE Weight Bearing: Weight bearing as tolerated General: General OT Amount of Missed Time: 35 Minutes Vital Signs: Therapy Vitals Temp: 98.1 F (36.7 C) Temp Source: Oral Pulse Rate: 79 Resp: (!) 22 BP: (!) 170/63 Patient Position (if appropriate): Sitting Oxygen Therapy SpO2: 97 % O2 Device: Room Air Pain: Pain Assessment Pain Scale: 0-10 Pain Score: 1  Pain Location: Pelvis Other Treatments:     Therapy/Group: Individual Therapy  Carlos Levering 06/20/2018, 8:42 AM

## 2018-06-21 ENCOUNTER — Inpatient Hospital Stay (HOSPITAL_COMMUNITY): Payer: Self-pay | Admitting: Physical Therapy

## 2018-06-21 ENCOUNTER — Inpatient Hospital Stay (HOSPITAL_COMMUNITY): Payer: Self-pay | Admitting: Occupational Therapy

## 2018-06-21 LAB — BASIC METABOLIC PANEL
Anion gap: 12 (ref 5–15)
BUN: 40 mg/dL — ABNORMAL HIGH (ref 8–23)
CO2: 20 mmol/L — ABNORMAL LOW (ref 22–32)
Calcium: 9.7 mg/dL (ref 8.9–10.3)
Chloride: 105 mmol/L (ref 98–111)
Creatinine, Ser: 1.72 mg/dL — ABNORMAL HIGH (ref 0.61–1.24)
GFR calc Af Amer: 41 mL/min — ABNORMAL LOW (ref >60)
GFR calc non Af Amer: 35 mL/min — ABNORMAL LOW (ref >60)
GLUCOSE: 200 mg/dL — AB (ref 70–99)
Potassium: 4.7 mmol/L (ref 3.5–5.1)
Sodium: 137 mmol/L (ref 135–145)

## 2018-06-21 LAB — GLUCOSE, CAPILLARY
Glucose-Capillary: 99 mg/dL (ref 70–99)
Glucose-Capillary: 141 mg/dL — ABNORMAL HIGH (ref 70–99)
Glucose-Capillary: 113 mg/dL — ABNORMAL HIGH (ref 70–99)
Glucose-Capillary: 173 mg/dL — ABNORMAL HIGH (ref 70–99)

## 2018-06-21 NOTE — Progress Notes (Signed)
Physical Therapy Session Note  Patient Details  Name: Tyler [name] MRN: [id number] Date of Birth: Jun 13, 1928  Today's Date: 06/21/2018 PT Individual Time: 3567-0141 PT Individual Time Calculation (min): 25 min   Short Term Goals: Week 1:  PT Short Term Goal 1 (Week 1): Pt will ambulate 100 ft with RW and min assist PT Short Term Goal 2 (Week 1): Pt will perform sit to stand transfers with supervision PT Short Term Goal 3 (Week 1): Pt will ascend/descend 4 stairs with 2 handrails and min assist  Skilled Therapeutic Interventions/Progress Updates:   Pt in w/c and agreeable to therapy, pain as detailed below. Pt self-propelled w/c to/from therapy gym w/ supervision using BUEs to work on endurance and UE strengthening/coordination. Blocked practice of sit<>stands from mat surface w/o UE support to stabilize in stance. CGA-min assist w/ fatigue. Emphasis on increasing forward trunk lean during transitioning and not relying on mat surface to stabilize on back of LEs. Pt w/ increasing R pelvis pain at fracture site as session continued. Pain 5/10 at end of session, pt declined any intervention. Returned to room and ended session in recliner, all needs in reach.   Therapy Documentation Precautions:  Precautions Precautions: Fall Restrictions Weight Bearing Restrictions: No RLE Weight Bearing: Weight bearing as tolerated LLE Weight Bearing: Weight bearing as tolerated Vital Signs: Therapy Vitals Temp: 97.8 F (36.6 C) Temp Source: Oral Pulse Rate: 79 Resp: 18 BP: (!) 142/68 Patient Position (if appropriate): Sitting Oxygen Therapy SpO2: 98 % O2 Device: Room Air Pain: Pain Assessment Pain Scale: Faces Pain Score: 3  Faces Pain Scale: Hurts a little bit Pain Type: Acute pain Pain Location: Pelvis Pain Orientation: Right Pain Descriptors / Indicators: Discomfort Pain Onset: With Activity Pain Intervention(s): Repositioned  Therapy/Group: Individual Therapy  [name] Clent Demark 06/21/2018, 4:27 PM

## 2018-06-21 NOTE — Progress Notes (Signed)
[facility] PHYSICAL MEDICINE & REHABILITATION PROGRESS NOTE  Subjective/Complaints: Pt seen sitting up in bed this AM.  He states he slept well overnight.   ROS: Denies, CP, SOB, N/V/D  Objective: Vital Signs: Blood pressure 136/60, pulse 66, temperature 98 F (36.7 C), temperature source Oral, resp. rate 17, height 5\' 11"  (1.803 m), weight 73.6 kg, SpO2 95 %. Ct Head Wo Contrast  Result Date: 06/20/2018 CLINICAL DATA:  Bilateral lower extremity weakness. EXAM: CT HEAD WITHOUT CONTRAST TECHNIQUE: Contiguous axial images were obtained from the base of the skull through the vertex without intravenous contrast. COMPARISON:  CT scan of June 28, 2016. FINDINGS: Brain: Mild diffuse cortical atrophy is noted. Mild chronic ischemic white matter disease is noted. No mass effect or midline shift is noted. Ventricular size is within normal limits. There is no evidence of mass lesion, hemorrhage or acute infarction. Vascular: No hyperdense vessel or unexpected calcification. Skull: Normal. Negative for fracture or focal lesion. Sinuses/Orbits: No acute finding. Other: None. IMPRESSION: Mild diffuse cortical atrophy. Mild chronic ischemic white matter disease. No acute intracranial abnormality seen. Electronically Signed   By: Marijo Conception, M.D.   On: 06/20/2018 09:04   No results for input(s): WBC, HGB, HCT, PLT in the last 72 hours. No results for input(s): NA, K, CL, CO2, GLUCOSE, BUN, CREATININE, CALCIUM in the last 72 hours.  Physical Exam: BP 136/60 (BP Location: Right Arm)   Pulse 66   Temp 98 F (36.7 C) (Oral)   Resp 17   Ht 5\' 11"  (1.803 m)   Wt 73.6 kg   SpO2 95%   BMI 22.63 kg/m  Constitutional: No distress . Vital signs reviewed. HENT: Normocephalic.  Atraumatic. Eyes: EOMI. No discharge. Cardiovascular: RRR. [name]. Respiratory: CTA bilaterally. Normal effort. GI: BS +. Non-distended. Musc: Tenderness to right pelvis improving Neurological: He is alert and oriented.   HOH ?mild processing delays--no changes Motor: Right upper extremity: 4+/5 Left upper extremity: 5/5 Right lower extremity: 4-4+/5  Left lower extremity: 5/5 Skin: Warm and dry.  Intact. Psychiatric: Anxious  Assessment/Plan: 1. Functional deficits secondary to pelvic fracture which require 3+ hours per day of interdisciplinary therapy in a comprehensive inpatient rehab setting.  Physiatrist is providing close team supervision and 24 hour management of active medical problems listed below.  Physiatrist and rehab team continue to assess barriers to discharge/monitor patient progress toward functional and medical goals  Care Tool:  Bathing    Body parts bathed by patient: Right arm, Left arm, Chest, Abdomen, Front perineal area, Buttocks, Face, Right lower leg, Left upper leg, Right upper leg         Bathing assist Assist Level: Minimal Assistance - Patient > 75%     Upper Body Dressing/Undressing Upper body dressing   What is the patient wearing?: Pull over shirt    Upper body assist Assist Level: Minimal Assistance - Patient > 75%    Lower Body Dressing/Undressing Lower body dressing      What is the patient wearing?: Incontinence brief, Pants     Lower body assist Assist for lower body dressing: Minimal Assistance - Patient > 75%     Toileting Toileting    Toileting assist Assist for toileting: Moderate Assistance - Patient 50 - 74%     Transfers Chair/bed transfer  Transfers assist     Chair/bed transfer assist level: Minimal Assistance - Patient > 75%     Locomotion Ambulation   Ambulation assist      Assist level:  Minimal Assistance - Patient > 75% Assistive device: Walker-rolling Max distance: 80   Walk 10 feet activity   Assist  Walk 10 feet activity did not occur: Safety/medical concerns  Assist level: Minimal Assistance - Patient > 75% Assistive device: Walker-rolling   Walk 50 feet activity   Assist Walk 50 feet with 2  turns activity did not occur: Safety/medical concerns  Assist level: Minimal Assistance - Patient > 75% Assistive device: Walker-rolling    Walk 150 feet activity   Assist Walk 150 feet activity did not occur: Safety/medical concerns         Walk 10 feet on uneven surface  activity   Assist Walk 10 feet on uneven surfaces activity did not occur: Safety/medical concerns         Wheelchair     Assist   Type of Wheelchair: Manual    Wheelchair assist level: Supervision/Verbal cueing Max wheelchair distance: 200    Wheelchair 50 feet with 2 turns activity    Assist        Assist Level: Supervision/Verbal cueing   Wheelchair 150 feet activity     Assist Wheelchair 150 feet activity did not occur: Safety/medical concerns   Assist Level: Supervision/Verbal cueing     Medical Problem List and Plan: 1.  Functional decline secondary to Pelvic fracture and pain.  Continue CIR   CT head reviewed, hydrocephalus ex vacuo, ECG reviewed- no acute changes, troponin, UA negative.  Labs pending, not drawn yesterday, will follow-up. Episode related to anxiety 2.  DVT Prophylaxis/Anticoagulation: Pharmaceutical: Lovenox 3. Pain Management: Continue tylenol qid with ultram prn. Wife does not want patient on any narcotics.   Pt working through the pain as much as he can 4. H/o Anxiety disorder/Mood: Continue Zoloft daily. LCSW to follow for evaluation and support.   Coping mechanisms for anxiety 5. Neuropsych: This patient is capable of making decisions on his own behalf. 6. Skin/Wound Care: Routine pressure relief measures.  7. Fluids/Electrolytes/Nutrition: Monitor I/O.  8. T2DM: Resumed lower dose Amaryl. Monitor BS ac/hs and use SSI for elevated BS. Added bedtime snack  Labile on 2/18 9. HTN: Monitor BP bid--continue Norvasc at bedtime and Cozaar in am.   Relatively controlled on 2/18  Consider further increase tomorrow if persistently elevated after episode  today  Monitor with increased mobility 10. Acute on chronic renal failure: Encourage fluid intake.   Creatinine 1.70 on 2/13  Labs pending, not drawn yesterday, will follow  Encourage fluids  Continue to monitor 11.  H/o CVA: On Plavix and Lipitor.  12. ABLA  Hemoglobin 11.6 on 2/13  Continue to monitor 13. Thrombocytopenia:?  Resolved  Platelets 170 on 2/13  Continue to monitor   LOS: 6 days A FACE TO FACE EVALUATION WAS PERFORMED  Griffin Gerrard Lorie Phenix 06/21/2018, 8:25 AM

## 2018-06-21 NOTE — Progress Notes (Signed)
Occupational Therapy Session Note  Patient Details  Name: Tyler [name] MRN: [id number] Date of Birth: Feb 13, 1929  Today's Date: 06/21/2018 OT Individual Time: 4037-0964 OT Individual Time Calculation (min): 60 min    Short Term Goals: Week 1:  OT Short Term Goal 1 (Week 1): STGs equal to LTGs set at supervision level.    Skilled Therapeutic Interventions/Progress Updates:    Pt completed bathing and dressing during session with spouse present. He was able to transfer from supine to sit with supervision and then ambulated to the walk-in shower with min guard assist for bathing.  He was able to remove all dirty clothing with min guard assist and then complete shower at the same level.  Dressing sit to stand at the sink with min guard assist overall.  Min assist only needed for pulling his shirt down in the back.  His spouse was present for session as well.  Discussed with her that pt will be at a supervision level at discharge and she will not have to provide physical assist, which is what she was worried about.  Pt left in the wheelchair at the sink at end of session to finish grooming tasks.  Spouse present to assist as well.   Therapy Documentation Precautions:  Precautions Precautions: Fall Restrictions Weight Bearing Restrictions: No RLE Weight Bearing: Weight bearing as tolerated LLE Weight Bearing: Weight bearing as tolerated   Pain: Pain Assessment Pain Scale: Faces Faces Pain Scale: Hurts a little bit Pain Type: Acute pain Pain Location: Pelvis Pain Orientation: Right Pain Descriptors / Indicators: Aching Pain Onset: With Activity Pain Intervention(s): Repositioned ADL:   Therapy/Group: Individual Therapy  Danial Sisley OTR/L 06/21/2018, 12:23 PM

## 2018-06-21 NOTE — Progress Notes (Signed)
Occupational Therapy Session Note  Patient Details  Name: QUINDARIUS CABELLO MRN: [id number] Date of Birth: 01-Feb-1929  Today's Date: 06/21/2018 OT Individual Time: 1103-1594 OT Individual Time Calculation (min): 32 min    Short Term Goals: Week 1:  OT Short Term Goal 1 (Week 1): STGs equal to LTGs set at supervision level.    Skilled Therapeutic Interventions/Progress Updates:    Pt worked on Counsellor transfers during session.  He was able to complete transfers with use of the RW and stepping in posteriorly with min assist and then stepping out anteriorly at the same level.  Discussed the need for a hand held shower and pt already has a built in seat that she can use. Finished session with call button and phone in reach and pt back in the room with spouse present.    Therapy Documentation Precautions:  Precautions Precautions: Fall Restrictions Weight Bearing Restrictions: No RLE Weight Bearing: Weight bearing as tolerated LLE Weight Bearing: Weight bearing as tolerated  Pain: Pain Assessment Pain Scale: Faces Pain Score: 3  Faces Pain Scale: Hurts a little bit Pain Type: Acute pain Pain Location: Pelvis Pain Orientation: Right Pain Descriptors / Indicators: Discomfort Pain Onset: With Activity Pain Intervention(s): Repositioned   Therapy/Group: Individual Therapy  Brandin Stetzer OTR/L 06/21/2018, 4:02 PM

## 2018-06-21 NOTE — Progress Notes (Signed)
Physical Therapy Session Note  Patient Details  Name: Tyler [name] MRN: [id number] Date of Birth: [birth year]  Today's Date: 06/21/2018 PT Individual Time: 1119-1205 and 1330-1430 PT Individual Time Calculation (min): 46 min and 60 min  Short Term Goals: Week 1:  PT Short Term Goal 1 (Week 1): Pt will ambulate 100 ft with RW and min assist PT Short Term Goal 2 (Week 1): Pt will perform sit to stand transfers with supervision PT Short Term Goal 3 (Week 1): Pt will ascend/descend 4 stairs with 2 handrails and min assist  Skilled Therapeutic Interventions/Progress Updates: Pt presented in w/c agreeable to therapy. Pt propelled to rehab gym supervision with brief intermittent breaks due to fatigue. Pt participated in gait training 171f with RW and toe up brace. Pt noted to note have any "catching" of R foot with ambulation however did require intermittent verbal cues for step length and for improved knee extension for LLE. Pt noted to require minA with increased ambulation distance due to fatigue and increased pain. Pt transported to day room and participated in Cybex Kinetron for BLE strengthening in seated position at 60cm/sec. Pt able to tolerate x 5 min. Upon return to w/c pt indicated significantly increased pain. Pt propelled back to room supervision with PTA advising nsg of increased pain. Per nsg pt scheduled for meds at this time. Pt returned to room at end of session and remained in w/c with lunch tray set up and wife present.   Tx2:  Pt presented in w/c agreeable to therapy. Pt stating pain 6/10 with having received pain meds within past 15 minutes. Pt propelled to rehab gym supervision and performed ambulatory transfer to mat. Pt participated in following seated and supine therex: Ankle pumps 2 x 10 BLE LAQ 2x10  BLE Hamstring curls with level 2 resistance band 2 x10 BLE Hip ER level 2 resistance band 2 x 10 bilaterally Hip adduction squeezes 2 x 10 Hip flexion 2 x 10  bilaterally Supine SAQ 2 x 10 Bridges x 10 Standing pt only able to tolerate RLE at this time due to pain with increased wt bearing SLR x 10 RLE only Hip abd x 10 RLE only Pt performed sit to supine/supine to sit with supervision to high/low mat and increased time.   Performed ambulatory transfer CGA and transported to day room. Participated in UBE L3 x 6 min (3 forward/3 backwards) for general conditioning and endurance. Pt transported back to room and remained in w/c at end of session with wife present and current needs met.       Therapy Documentation Precautions:  Precautions Precautions: Fall Restrictions Weight Bearing Restrictions: No RLE Weight Bearing: Weight bearing as tolerated LLE Weight Bearing: Weight bearing as tolerated General:   Vital Signs:   Pain: Pain Assessment Pain Scale: Faces Faces Pain Scale: Hurts a little bit Pain Type: Acute pain Pain Location: Pelvis Pain Orientation: Right Pain Descriptors / Indicators: Aching Pain Onset: With Activity Pain Intervention(s): Repositioned   Therapy/Group: Individual Therapy  Claire Bridge  Sanaz Scarlett, PTA  06/21/2018, 1:01 PM

## 2018-06-22 ENCOUNTER — Inpatient Hospital Stay (HOSPITAL_COMMUNITY): Payer: Self-pay | Admitting: Occupational Therapy

## 2018-06-22 ENCOUNTER — Inpatient Hospital Stay (HOSPITAL_COMMUNITY): Payer: Self-pay | Admitting: Physical Therapy

## 2018-06-22 DIAGNOSIS — R0989 Other specified symptoms and signs involving the circulatory and respiratory systems: Secondary | ICD-10-CM

## 2018-06-22 DIAGNOSIS — F411 Generalized anxiety disorder: Secondary | ICD-10-CM

## 2018-06-22 DIAGNOSIS — F410 Panic disorder [episodic paroxysmal anxiety] without agoraphobia: Secondary | ICD-10-CM

## 2018-06-22 LAB — GLUCOSE, CAPILLARY
Glucose-Capillary: 102 mg/dL — ABNORMAL HIGH (ref 70–99)
Glucose-Capillary: 192 mg/dL — ABNORMAL HIGH (ref 70–99)
Glucose-Capillary: 134 mg/dL — ABNORMAL HIGH (ref 70–99)
Glucose-Capillary: 157 mg/dL — ABNORMAL HIGH (ref 70–99)

## 2018-06-22 MED ORDER — CLONAZEPAM 0.25 MG PO TBDP
0.2500 mg | ORAL_TABLET | Freq: Two times a day (BID) | ORAL | Status: DC | PRN
  Filled 2018-06-22: qty 1

## 2018-06-22 NOTE — Progress Notes (Signed)
Occupational Therapy Weekly Progress Note  Patient Details  Name: Tyler [name] MRN: [id number] Date of Birth: 1928-10-31  Beginning of progress report period: June 16, 2018 End of progress report period: June 22, 2018  Today's Date: 06/22/2018 OT Individual Time: 1300-1400 OT Individual Time Calculation (min): 60 min    Pt is making steady progress with OT at this time.  He is able to complete most selfcare tasks at min guard to min assist including LB dressing, LB bathing, toilet transfers and walk-in shower transfers. He needs occasional min instructional cueing for technique during transfers for the simulated walk-in shower transfers using the RW for support.  He continues to have pain in the RLE/pelvic region which affects his ability to support weight in the RLE with mobility.  Feel he is on target for supervision level goals.  Recommend continued CIR level OT in anticipation of discharge 2/24.      Patient continues to demonstrate the following deficits: muscle weakness and decreased standing balance and decreased balance strategies and therefore will continue to benefit from skilled OT intervention to enhance overall performance with BADL and Reduce care partner burden.  Patient progressing toward long term goals..  Continue plan of care.  OT Short Term Goals Week 2:  OT Short Term Goal 1 (Week 2): STGs equal to LTGs set at supervision level.    Skilled Therapeutic Interventions/Progress Updates:    Pt completed wheelchair mobility down to the dayroom with supervision.  Once in the dayroom, he transferred to the chair on the UE ergonometer with min guard using the RW for support.  He was able to complete 15 mins on Random Program setting with intensity set at level 8.  Revolutions were maintained at 25-30 with pt completing 9 mins peddling forward.  He then completed 6 mins peddling posteriorly.  Next had pt completed functional transfers and mobility using the RW.  He was able  to complete transfers from regular chair with arms to wheelchair with min guard assist.  Also had pt work on stepping over 3-4" wide barrier forward and backwards with min guard assist using the RW for support.  He finished session with wheelchair mobility to the room where he was left up in the wheelchair with spouse present and safety belt in place with call button in reach.       Therapy Documentation Precautions:  Precautions Precautions: Fall Restrictions Weight Bearing Restrictions: No RLE Weight Bearing: Weight bearing as tolerated LLE Weight Bearing: Weight bearing as tolerated  Pain: Pain Assessment Pain Scale: Faces Faces Pain Scale: Hurts a little bit Pain Type: Acute pain Pain Location: Pelvis Pain Orientation: Right Pain Descriptors / Indicators: Discomfort Pain Onset: With Activity Pain Intervention(s): Repositioned;Emotional support ADL: See Care Tool Section for some details of ADLs  Therapy/Group: Individual Therapy  Floyde Dingley OTR/L 06/22/2018, 2:41 PM

## 2018-06-22 NOTE — Progress Notes (Signed)
Physical Therapy Session Note  Patient Details  Name: Tyler [name] MRN: [id number] Date of Birth: Sep 07, 1928  Today's Date: 06/22/2018 PT Individual Time: 1100-1200 PT Individual Time Calculation (min): 60 min   Short Term Goals: Week 1:  PT Short Term Goal 1 (Week 1): Pt will ambulate 100 ft with RW and min assist PT Short Term Goal 2 (Week 1): Pt will perform sit to stand transfers with supervision PT Short Term Goal 3 (Week 1): Pt will ascend/descend 4 stairs with 2 handrails and min assist  Skilled Therapeutic Interventions/Progress Updates: Pt presented in w/c with wife present agreeable to therapy. Pt c/o soreness in R hip 5/10, per pt recently received pain meds. Pt propelled to rehab gym supervision. Attempted stair training, pt attempted first step with B rails stepping with LLE, pt with significant R knee buckling while attempting fist step requiring mod A from PTA for recovery. Attempting with RLE however pt unable to produce enough power to push up to first step. Pt then transported to parallel bars and attempting step up with 4in step which pt was able to perform 2 bouts x 3. Pt then attempted 6 in step in parallel bars which pt was able to perform x 2 without significant increase in pain. Pt then transported to day room and performed NuStep L1 x 5 min maintaining smaller range for pain management for BLE strengthening and general conditioning. Pt performed transfers to/from NuStep close S. Pt propelled back to room at end of session and remained in w/c with belt alarm on, call bell within reach and lunch tray set up.      Therapy Documentation Precautions:  Precautions Precautions: Fall Restrictions Weight Bearing Restrictions: No RLE Weight Bearing: Weight bearing as tolerated LLE Weight Bearing: Weight bearing as tolerated General:   Vital Signs: Therapy Vitals Temp: 98 F (36.7 C) Pulse Rate: 86 Resp: 18 BP: (!) 142/60 Oxygen Therapy SpO2: 99 % O2 Device: Room  Air Pain: Pain Assessment Pain Scale: Faces Faces Pain Scale: Hurts little more Pain Type: Acute pain Pain Location: Pelvis Pain Orientation: Right Pain Descriptors / Indicators: Discomfort Pain Onset: With Activity Pain Intervention(s): Repositioned   Therapy/Group: Individual Therapy  Hanford Lust  Molly Maselli, PTA  06/22/2018, 4:10 PM

## 2018-06-22 NOTE — Progress Notes (Signed)
[facility] PHYSICAL MEDICINE & REHABILITATION PROGRESS NOTE  Subjective/Complaints: Patient seen lying in bed this morning.  He states he slept well overnight.  He states he does not feel well this morning that he hurts all over.  He states he is cold and numb and weak diffusely.  Educated patient on deep breathing exercises.  Previous discussion with PA and patient's wife as well regarding symptoms, with wife stating that patient does this from time to time.  ROS: + Chest pain, shortness of breath, nausea.   Objective: Vital Signs: Blood pressure (!) 162/61, pulse 67, temperature 97.7 F (36.5 C), temperature source Oral, resp. rate 20, height 5\' 11"  (1.803 m), weight 74 kg, SpO2 97 %. No results found. No results for input(s): WBC, HGB, HCT, PLT in the last 72 hours. Recent Labs    06/21/18 1046  NA 137  K 4.7  CL 105  CO2 20*  GLUCOSE 200*  BUN 40*  CREATININE 1.72*  CALCIUM 9.7    Physical Exam: BP (!) 162/61   Pulse 67   Temp 97.7 F (36.5 C) (Oral)   Resp 20   Ht 5\' 11"  (1.803 m)   Wt 74 kg   SpO2 97%   BMI 22.75 kg/m  Constitutional: No distress . Vital signs reviewed. HENT: Normocephalic.  Atraumatic. Eyes: EOMI. No discharge. Cardiovascular: RRR.  [name]. Respiratory: CTA bilaterally.  Normal effort. GI: BS +. Non-distended. Musc: Tenderness to right pelvis improving Neurological: He is alert and oriented.  HOH ?mild processing delays--no changes Motor: Right upper extremity: 4+/5, stable Left upper extremity: 5/5 Right lower extremity: 4-4+/5, stable Left lower extremity: 5/5 Skin: Warm and dry.  Intact. Psychiatric: Anxious  Assessment/Plan: 1. Functional deficits secondary to pelvic fracture which require 3+ hours per day of interdisciplinary therapy in a comprehensive inpatient rehab setting.  Physiatrist is providing close team supervision and 24 hour management of active medical problems listed below.  Physiatrist and rehab team continue to  assess barriers to discharge/monitor patient progress toward functional and medical goals  Care Tool:  Bathing    Body parts bathed by patient: Right arm, Left arm, Chest, Abdomen, Front perineal area, Buttocks, Face, Right lower leg, Left upper leg, Right upper leg         Bathing assist Assist Level: Contact Guard/Touching assist     Upper Body Dressing/Undressing Upper body dressing   What is the patient wearing?: Pull over shirt    Upper body assist Assist Level: Minimal Assistance - Patient > 75%    Lower Body Dressing/Undressing Lower body dressing      What is the patient wearing?: Incontinence brief, Pants     Lower body assist Assist for lower body dressing: Contact Guard/Touching assist     Toileting Toileting    Toileting assist Assist for toileting: Moderate Assistance - Patient 50 - 74%     Transfers Chair/bed transfer  Transfers assist     Chair/bed transfer assist level: Minimal Assistance - Patient > 75%     Locomotion Ambulation   Ambulation assist      Assist level: Contact Guard/Touching assist Assistive device: Walker-rolling Max distance: 12'   Walk 10 feet activity   Assist  Walk 10 feet activity did not occur: Safety/medical concerns  Assist level: Contact Guard/Touching assist Assistive device: Walker-rolling   Walk 50 feet activity   Assist Walk 50 feet with 2 turns activity did not occur: Safety/medical concerns  Assist level: Contact Guard/Touching assist Assistive device: Walker-rolling  Walk 150 feet activity   Assist Walk 150 feet activity did not occur: Safety/medical concerns         Walk 10 feet on uneven surface  activity   Assist Walk 10 feet on uneven surfaces activity did not occur: Safety/medical concerns         Wheelchair     Assist   Type of Wheelchair: Manual    Wheelchair assist level: Supervision/Verbal cueing Max wheelchair distance: 150'    Wheelchair 50 feet with  2 turns activity    Assist        Assist Level: Supervision/Verbal cueing   Wheelchair 150 feet activity     Assist Wheelchair 150 feet activity did not occur: Safety/medical concerns   Assist Level: Supervision/Verbal cueing     Medical Problem List and Plan: 1.  Functional decline secondary to Pelvic fracture and pain.  Continue CIR   CT head reviewed, hydrocephalus ex vacuo, ECG reviewed- no acute changes, troponin, UA negative.  Episode related to anxiety 2.  DVT Prophylaxis/Anticoagulation: Pharmaceutical: Lovenox 3. Pain Management: Continue tylenol qid with ultram prn. Wife does not want patient on any narcotics.   Pt working through the pain as much as he can 4. Anxiety disorder with panic attack: Continue Zoloft daily. LCSW to follow for evaluation and support.   Coping mechanisms for anxiety, prolonged discussion with patient today regarding coping mechanisms- perform deep breathing exercises with patient  Klonopin 0.25 twice daily as needed started on 2/19 5. Neuropsych: This patient is capable of making decisions on his own behalf. 6. Skin/Wound Care: Routine pressure relief measures.  7. Fluids/Electrolytes/Nutrition: Monitor I/O.  8. T2DM: Resumed lower dose Amaryl. Monitor BS ac/hs and use SSI for elevated BS. Added bedtime snack  Labile on 2/19 9. HTN: Monitor BP bid--continue Norvasc at bedtime and Cozaar in am.   Labile on 2/19  Consider further increase tomorrow if persistently elevated after episode today  Monitor with increased mobility 10. Acute on chronic renal failure: Encourage fluid intake.   Creatinine 1.72 on 2/18  Encourage fluids  Continue to monitor 11.  H/o CVA: On Plavix and Lipitor.  12. ABLA  Hemoglobin 11.6 on 2/13  Continue to monitor 13. Thrombocytopenia:?  Resolved  Platelets 170 on 2/13  Continue to monitor   LOS: 7 days A FACE TO FACE EVALUATION WAS PERFORMED  Lielle Vandervort Lorie Phenix 06/22/2018, 11:35 AM

## 2018-06-22 NOTE — Progress Notes (Signed)
Occupational Therapy Session Note  Patient Details  Name: Tyler [name] MRN: [id number] Date of Birth: Mar 27, 1929  Today's Date: 06/22/2018 OT Individual Time: 4665-9935 OT Individual Time Calculation (min): 33 min    Short Term Goals: Week 2:  OT Short Term Goal 1 (Week 2): STGs equal to LTGs set at supervision level.    Skilled Therapeutic Interventions/Progress Updates:   Pt completed transfer from the wheelchair to the therapy mat with min guard assist using the RW for support.  Had him practice sit to supine X 2 onto the mat to both the left and right.  He was able to complete this with supervision and increased time.  Next had him work in standing on picking items up from the floor.  He was able to pick up a cone from the floor with min guard assist and then a horse shoe.  Finished session with ambulation out in the hallway with use of the RW.  He ambulated approximately 50'.  Pt returned to room at end of session. Discussed expected discharge plan of Monday 2/24 with spouse and pt.  She was concerned about this as she was told he would be here three weeks by someone prior to evaluation.  Re-assured her that he is almost at a supervision level and she will not need to provide much if any physical assist.  She voiced that he has a den that has three steps to get down to and asked would he be able to access this.  This therapist feels that he will likely not be able to complete this at discharge secondary to not having bilateral rails to hold onto.  She will need to have one of her chairs moved up so that he can use it, since he cannot access the den without more assist than spouse can provide.  Pt left in the wheelchair at end of session with call button in reach and safety alarm belt in place.     Therapy Documentation Precautions:  Precautions Precautions: Fall Restrictions Weight Bearing Restrictions: No RLE Weight Bearing: Weight bearing as tolerated LLE Weight Bearing: Weight bearing  as tolerated  Pain: Pain Assessment Pain Scale: Faces Faces Pain Scale: Hurts little more Pain Type: Acute pain Pain Location: Pelvis Pain Orientation: Right Pain Descriptors / Indicators: Discomfort Pain Onset: With Activity Pain Intervention(s): Repositioned ADL:  Therapy/Group: Individual Therapy  Kaelie Henigan OTR/L 06/22/2018, 4:03 PM

## 2018-06-22 NOTE — Progress Notes (Signed)
Physical Therapy Session Note  Patient Details  Name: Tyler [name] MRN: [id number] Date of Birth: 1928-09-07  Today's Date: 06/22/2018 PT Individual Time: 0912-1000 PT Individual Time Calculation (min): 48 min   Short Term Goals: Week 1:  PT Short Term Goal 1 (Week 1): Pt will ambulate 100 ft with RW and min assist PT Short Term Goal 2 (Week 1): Pt will perform sit to stand transfers with supervision PT Short Term Goal 3 (Week 1): Pt will ascend/descend 4 stairs with 2 handrails and min assist  Skilled Therapeutic Interventions/Progress Updates:   Pt received supine in bed with RN present. Pt notably Labile with increased fear of movement and poor motor planning with bed mobility. PT assisted pt to scoot to [facility] with RN and allowed to take medicaiton. PT returned in 15 minutes, and agreeable to PT, but hesitant. Supine>sit transfer with mod assist  At trunk due to pain in R hip. Stand pivot transfer to [facility] with CGA and min cues for step length and use of RW. PT applied foot up brace following transfer  Pt transported to rehab gym, and continues to report severe anxiety. PT instructed pt in seated LE therex.  LAQ x 8, hip flexion x 8 , hip abduction, x 10  hip adducton x 10. Ankle PF x 12, and DF x15 , sit<>stand x 5 Supervision assist from PT for all therex with min cues for ROM, and pursed lip breathing to manage pain in the R hip.    Gait training instructed by PT x 156f with RW, CGA and min cues for step length on the R, safety in turns and posture. Pt reports no increase in pain with gait training and is notably less anxious then start of PT treatment.   [facility] and all needs met.         Therapy Documentation Precautions:  Precautions Precautions: Fall Restrictions Weight Bearing Restrictions: No RLE Weight Bearing: Weight bearing as tolerated LLE Weight Bearing: Weight bearing as tolerated    Vital  Signs: Therapy Vitals Pulse Rate: 67 BP: (!) 162/61 Pain:   5/10 with movement in the Rhip. RN aware (See MAR)    Therapy/Group: Individual Therapy  ALorie Phenix2/19/2020, 9:58 AM

## 2018-06-22 NOTE — Progress Notes (Signed)
Orthopedic Tech Progress Note Patient Details:  Tyler [name] 1928-06-13 [id number] [place] called in this brace order Patient ID: Tyler [name], male   DOB: [birth year], 85 y.o.   MRN: [id number]   Janit Pagan 06/22/2018, 3:07 PM

## 2018-06-22 NOTE — Progress Notes (Signed)
Patient perseverating on weakness of limbs, feeling bad, concerned about incontinence, lack of sleep, unable to eat or do therapy today. RN listened to concerns and redirected patient to work on one step at time. Wife had discussed regular anxiety upon waking.  Moved daily zoloft administration to earlier in morning to mirror home pattern starting tomorrow. Patient calm after first therapy.

## 2018-06-23 ENCOUNTER — Inpatient Hospital Stay (HOSPITAL_COMMUNITY): Payer: Self-pay | Admitting: Physical Therapy

## 2018-06-23 ENCOUNTER — Inpatient Hospital Stay (HOSPITAL_COMMUNITY): Payer: Self-pay | Admitting: Occupational Therapy

## 2018-06-23 LAB — GLUCOSE, CAPILLARY
Glucose-Capillary: 98 mg/dL (ref 70–99)
Glucose-Capillary: 153 mg/dL — ABNORMAL HIGH (ref 70–99)
Glucose-Capillary: 76 mg/dL (ref 70–99)
Glucose-Capillary: 241 mg/dL — ABNORMAL HIGH (ref 70–99)

## 2018-06-23 NOTE — Patient Care Conference (Signed)
Inpatient RehabilitationTeam Conference and Plan of Care Update Date: 06/22/2018   Time: 2:40 PM    Patient Name: Tyler [name]      Medical Record Number: [id number]  Date of Birth: Sep 28, 1928 Sex: Male         Room/Bed: 4M07C/4M07C-01 Payor Info: Payor: [employer] / Plan: [employer] PPO / Product Type: *No Product type* /    Admitting Diagnosis: Pelvic FX  Admit Date/Time:  06/15/2018  5:07 PM Admission Comments: No comment available   Primary Diagnosis:  <principal problem not specified> Principal Problem: <principal problem not specified>  Patient Active Problem List   Diagnosis Date Noted  . Labile blood pressure   . Panic attack   . Anxiety state   . Labile blood glucose   . Benign essential HTN   . Stage 3 chronic kidney disease ([place])   . Dizziness and giddiness   . Numbness   . Acute blood loss anemia   . AKI (acute kidney injury) ([place])   . Diabetes mellitus type 2 in nonobese (HCC)   . Pelvic fracture ([place]) 06/15/2018  . Pain in joint involving right pelvic region and thigh   . Thrombocytopenia ([place])   . Diabetes mellitus type 2 in obese ([place])   . Anemia of chronic disease   . Type II diabetes mellitus with renal manifestations ([place]) 06/12/2018  . Fall 06/12/2018  . Closed fracture of pubic ramus, right, initial encounter ([place]) 06/12/2018  . Leukocytosis 06/12/2018  . Closed fracture of right pubis, initial encounter ([place]) 06/12/2018  . TIA (transient ischemic attack) 06/28/2016  . CKD (chronic kidney disease), stage III ([place]) 06/28/2016  . Microscopic hematuria 06/28/2016  . Femur fracture, right ([place]) 11/16/2015  . History of nephrolithiasis 11/16/2015  . Pseudobulbar affect 06/07/2014  . Essential hypertension   . Memory loss   . Speech abnormality 01/28/2012  . Cerebral embolism with cerebral infarction ([place]) 01/28/2012  . History of stroke 01/28/2012  . Type 2 diabetes mellitus without complication, without long-term current use of  insulin ([place]) 01/28/2012  . Hyperlipidemia 01/28/2012  . History of prostate cancer 01/28/2012    Expected Discharge Date: Expected Discharge Date: 06/27/18  Team Members Present: Physician leading conference: Dr. Delice Lesch Social Worker Present: Lennart Pall, LCSW Nurse Present: Rayetta Pigg, RN PT Present: Barrie Folk, PT OT Present: Clyda Greener, OT SLP Present: Weston Anna, SLP PPS Coordinator present : Gunnar Fusi     Current Status/Progress Goal Weekly Team Focus  Medical   Functional decline secondary to Pelvic fracture and pain  Improve mobility, transfers, anxiety, DM/HTN, CKD  See above   Bowel/Bladder   Continent of B/B with episodes of incontinence during the night; LBM 06/20/2018  offer tolieting during night to help with incontinent episodes; maintain regular bowel pattern  assist with tolieting needs prn   Swallow/Nutrition/ Hydration             ADL's   supervision for UB selfcare with min guard assist for LB selfcare sit to stand and for functional transfers.    supervision overall  selfcare retraining, balance retraining, transfer training, DME/AE education, therapeutic exercise, pt/family education   Mobility   superivsion assist bed mobility, with intermittend min assist due to pain. Supervision assist transfers, ambulation up to 152ft with RW, and WC mobility. min assist stair management with BUE support on bil rails.   Supervision assist overall with LRAD at ambulatory level   improved balance, endurance, paint management, safety with transfers, gait, and ambulation.  Communication             Safety/Cognition/ Behavioral Observations            Pain   no c/o pain; scheduled tylenol   pain level >2  assess pain q shift and prn   Skin   ecchymosis on R hip from fall; R elbow abrasion from fall healed  remain free of new skin breakdown/infection  assess skin q shift and prn     Rehab Goals Patient on target to meet rehab goals: Yes *See  Care Plan and progress notes for long and short-term goals.     Barriers to Discharge  Current Status/Progress Possible Resolutions Date Resolved   Physician    Medical stability;Behavior     See above  Therapies, optimize DM/HTN meds, follow labs, coping mechanisms, PRN anxiolytics      Nursing                  PT                    OT                  SLP                SW                Discharge Planning/Teaching Needs:  Pt to d/c home with wife who can provide only supervision.  Teaching has been ongoing with wife who is here most days,   Team Discussion:  Having anxiety every morning, however, resolves on it's own.  Need to push fluids right now.  incont at night but mostly cont days.  Supervision to CGA overall with therapies.  Pain in hip can make bed mobility difficult.  Supervision goals.  Wife anxious about meeting care needs - SW to follow up.  Revisions to Treatment Plan:  NA    Continued Need for Acute Rehabilitation Level of Care: The patient requires daily medical management by a physician with specialized training in physical medicine and rehabilitation for the following conditions: Daily direction of a multidisciplinary physical rehabilitation program to ensure safe treatment while eliciting the highest outcome that is of practical value to the patient.: Yes Daily medical management of patient stability for increased activity during participation in an intensive rehabilitation regime.: Yes Daily analysis of laboratory values and/or radiology reports with any subsequent need for medication adjustment of medical intervention for : Other;Mood/behavior problems;Diabetes problems;Blood pressure problems;Renal problems   I attest that I was present, lead the team conference, and concur with the assessment and plan of the team.   Andres Vest 06/24/2018, 12:12 PM

## 2018-06-23 NOTE — Progress Notes (Signed)
[facility] PHYSICAL MEDICINE & REHABILITATION PROGRESS NOTE  Subjective/Complaints: Patient seen laying in bed this morning.  Tyler [name] states Tyler [name] did not sleep well overnight due to congestion and woke up this morning with chest pain.  ROS: + Congestion, chest pain.  Denies diarrhea  Objective: Vital Signs: Blood pressure (!) 135/59, pulse 63, temperature 97.6 F (36.4 C), temperature source Oral, resp. rate 18, height 5\' 11"  (1.803 m), weight 73.1 kg, SpO2 96 %. No results found. No results for input(s): WBC, HGB, HCT, PLT in the last 72 hours. Recent Labs    06/21/18 1046  NA 137  K 4.7  CL 105  CO2 20*  GLUCOSE 200*  BUN 40*  CREATININE 1.72*  CALCIUM 9.7    Physical Exam: BP (!) 135/59 (BP Location: Right Arm) Comment: rn notified   Pulse 63   Temp 97.6 F (36.4 C) (Oral)   Resp 18   Ht 5\' 11"  (1.803 m)   Wt 73.1 kg   SpO2 96%   BMI 22.48 kg/m  Constitutional: No distress . Vital signs reviewed. HENT: Normocephalic.  Atraumatic. Eyes: EOMI. No discharge. Cardiovascular: RRR.  [name]. Respiratory: CTA bilaterally.  Normal effort. GI: BS +. Non-distended. Musc: Tenderness to right pelvis improving Neurological: Tyler [name] is alert and oriented.  HOH ?mild processing delays--stable Motor: Right upper extremity: 4+/5, stable Left upper extremity: 5/5 Right lower extremity: 4-/5, (limited by participation and pain inhibition) Left lower extremity: 5/5 Skin: Warm and dry.  Intact. Psychiatric: Anxious  Assessment/Plan: 1. Functional deficits secondary to pelvic fracture which require 3+ hours per day of interdisciplinary therapy in a comprehensive inpatient rehab setting.  Physiatrist is providing close team supervision and 24 hour management of active medical problems listed below.  Physiatrist and rehab team continue to assess barriers to discharge/monitor patient progress toward functional and medical goals  Care Tool:  Bathing    Body parts bathed by patient:  Right arm, Left arm, Chest, Abdomen, Front perineal area, Buttocks, Face, Right lower leg, Left upper leg, Right upper leg         Bathing assist Assist Level: Contact Guard/Touching assist     Upper Body Dressing/Undressing Upper body dressing   What is the patient wearing?: Pull over shirt    Upper body assist Assist Level: Minimal Assistance - Patient > 75%    Lower Body Dressing/Undressing Lower body dressing      What is the patient wearing?: Incontinence brief, Pants     Lower body assist Assist for lower body dressing: Contact Guard/Touching assist     Toileting Toileting    Toileting assist Assist for toileting: Moderate Assistance - Patient 50 - 74%     Transfers Chair/bed transfer  Transfers assist     Chair/bed transfer assist level: Contact Guard/Touching assist     Locomotion Ambulation   Ambulation assist      Assist level: Contact Guard/Touching assist Assistive device: Walker-rolling Max distance: 132ft   Walk 10 feet activity   Assist  Walk 10 feet activity did not occur: Safety/medical concerns  Assist level: Supervision/Verbal cueing Assistive device: Walker-rolling   Walk 50 feet activity   Assist Walk 50 feet with 2 turns activity did not occur: Safety/medical concerns  Assist level: Supervision/Verbal cueing Assistive device: Walker-rolling    Walk 150 feet activity   Assist Walk 150 feet activity did not occur: Safety/medical concerns         Walk 10 feet on uneven surface  activity   Assist Walk 10  feet on uneven surfaces activity did not occur: Safety/medical concerns         Wheelchair     Assist   Type of Wheelchair: Manual    Wheelchair assist level: Supervision/Verbal cueing Max wheelchair distance: 150'    Wheelchair 50 feet with 2 turns activity    Assist        Assist Level: Supervision/Verbal cueing   Wheelchair 150 feet activity     Assist Wheelchair 150 feet activity  did not occur: Safety/medical concerns   Assist Level: Supervision/Verbal cueing     Medical Problem List and Plan: 1.  Functional decline secondary to Pelvic fracture and pain.  Continue CIR   CT head reviewed, hydrocephalus ex vacuo, ECG reviewed- no acute changes, troponin, UA negative.  Episode related to anxiety 2.  DVT Prophylaxis/Anticoagulation: Pharmaceutical: Lovenox 3. Pain Management: Continue tylenol qid with ultram prn. Wife does not want patient on any narcotics.   Pt working through the pain as much as Tyler [name] can 4. Anxiety disorder with panic attack: Continue Zoloft daily-timing of Zoloft changed by staff patient and family.   LCSW to follow for evaluation and support.   Coping mechanisms for anxiety, prolonged discussion with patient regarding coping mechanisms- have performed deep breathing exercises with patient  Klonopin 0.25 twice daily as needed started on 2/19 5. Neuropsych: This patient is capable of making decisions on his own behalf. 6. Skin/Wound Care: Routine pressure relief measures.  7. Fluids/Electrolytes/Nutrition: Monitor I/O.  8. T2DM: Resumed lower dose Amaryl. Monitor BS ac/hs and use SSI for elevated BS. Added bedtime snack  Labile, but improving on 2/20 9. HTN: Monitor BP bid--continue Norvasc at bedtime and Cozaar in am.   Relatively controlled on 2/20  Monitor with increased mobility 10. Acute on chronic renal failure: Encourage fluid intake.   Creatinine 1.72 on 2/18  Encourage fluids  Continue to monitor 11.  H/o CVA: On Plavix and Lipitor.  12. ABLA  Hemoglobin 11.6 on 2/13  Continue to monitor 13. Thrombocytopenia:?  Resolved  Platelets 170 on 2/13  Continue to monitor   LOS: 8 days A FACE TO FACE EVALUATION WAS PERFORMED  Tyler [name] 06/23/2018, 8:26 AM

## 2018-06-23 NOTE — Progress Notes (Signed)
Occupational Therapy Session Note  Patient Details  Name: [name] MRN: [id number] Date of Birth: Sep 28, 1928  Today's Date: 06/23/2018 OT Individual Time: 1015-1050 OT Individual Time Calculation (min): 35 min    Short Term Goals: Week 1:  OT Short Term Goal 1 (Week 1): STGs equal to LTGs set at supervision level.   Week 2:  OT Short Term Goal 1 (Week 2): STGs equal to LTGs set at supervision level.    Skilled Therapeutic Interventions/Progress Updates:    1:1 Self care retraining with focus on showering at pt's request. Pt doffed clothing sitting in recliner and ambulated to bathroom with RW with contact guard to supervision. Pt requires more than reasonable time to transition over thresholds due to ongoing pain. Pt able to bath all parts except buttocks with setup with long handled sponge with contact guard. Pt did required A to bath buttocks for thoroughness. Pt able to demonstrate static to more dynamic standing balance with contact guard while drying off on shower in standing without UE support.   Pt ambulated back out to the recliner to get dressed.  Pt again required more time and assistance to don brief but able to perform with contact guard sit to stand. Pt unable, at this time, don/ fasten the upright brace he is wearing. LEft sitting up in the recliner with seat belt.   Therapy Documentation Precautions:  Precautions Precautions: Fall Restrictions Weight Bearing Restrictions: No RLE Weight Bearing: Weight bearing as tolerated LLE Weight Bearing: Weight bearing as tolerated Pain:  ongoing pain in pelvis (more on the right side); pt was premedicated and allowed for rest breaks prn   Therapy/Group: Individual Therapy  [name] [facility] 06/23/2018, 1:58 PM

## 2018-06-23 NOTE — Progress Notes (Signed)
Physical Therapy Weekly Progress Note  Patient Details  Name: Tyler [name] MRN: [id number] Date of Birth: 1928/08/21  Beginning of progress report period: June 16, 2018 End of progress report period: June 16, 2018  Today's Date: 06/23/2018 PT Individual Time: 0800-0912 AND 1132-1200 AND 1500-1530 PT Individual Time Calculation (min): 72 min and 28 min and 30 min   Patient has met 3 of 3 short term goals. Pt is making steady progress towards LTG. Hip pain and AM anxiety limit participation and increased success at times. Pt currently performed all mobility tasks with supervision assist including WC mobility, bed mobility, ambulation up to 160f with RW, and basic transfers; min assist required on stairs with BUE support .   Patient continues to demonstrate the following deficits muscle weakness and muscle joint tightness, decreased cardiorespiratoy endurance and decreased standing balance, decreased balance strategies and difficulty maintaining precautions and therefore will continue to benefit from skilled PT intervention to increase functional independence with mobility.  Patient progressing toward long term goals..  Continue plan of care.  PT Short Term Goals Week 1:  PT Short Term Goal 1 (Week 1): Pt will ambulate 100 ft with RW and min assist PT Short Term Goal 1 - Progress (Week 1): Met PT Short Term Goal 2 (Week 1): Pt will perform sit to stand transfers with supervision PT Short Term Goal 2 - Progress (Week 1): Met PT Short Term Goal 3 (Week 1): Pt will ascend/descend 4 stairs with 2 handrails and min assist PT Short Term Goal 3 - Progress (Week 1): Met Week 2:  PT Short Term Goal 1 (Week 2): STG=LTG due to ELOS  Skilled Therapeutic Interventions/Progress Updates:  Session 1  Pt received supine in bed and agreeable to PT. Supine>sit transfer with supervision assist on the L and min cues to prevent use of bed features  Sit<>stand for use of urinal with supervision assist  from PT for transfer, Clothing mangement, and urinal management. [facility].   WC mobility x 1561f+ 1009fith supervisionn assist and min cues for doorway management.   Gait training x [id number] supervision assist from PT and RW, min cues for step length on the R, but improved for previous treatment sessions. Pt reports mild dizziness following gait training. Orthostatic BP assessed, 113/53 sitting. 130/76 standing. No change in s/s.   Stair management with BUE supoport on Bil rails with minA-CGA. Ascent/descent with 1 rail on the R with mod assist from PT due to R hip pain. No reports of dizziness in stair management. Min cues for step to gait pattern and proper UE placement to limit pain in the R hip.   Patient returned to [facility] WC. [facility] for attempted bowel movement, but unable to complete. Pt urinated while on toilet. Sit<>stand from toilet to manage clothing with supervision assist. Supervision assist ambulatory transfer to recliner with RW. Pt left sitting in recliner with call bell in reach and all needs met.     Session 2.   Pt received sitting in recliner and agreeable to PT. Stand pivot [facility] and supervision assist. PT instructed pt in WC mobility x 150ft,53fft a75f00ft wi60fupervision assist for turning technique and to improve UE strength/endurance. Gait training with RW x 125 and supervision assist from PT. Decreased step length noted with increased distance due to pain. Gait training also performed over unlevel surface of woodplanked ramp x 20ft wit58f  supervision assist from PT with min cues for AD management. Patient returned to room and left sitting in [facility] with call bell in reach and all needs met.     Session 3.   Pt received sitting in recliner and agreeable to PT. Stand pivot transfer to [facility] with supervision assist and RW. PT transported pt to rehab gym in [facility].   Ambulatory transfer to and  from mat table with RW and supervision assist from PT. Sit<>supine on the L side of the bed with supervision assist and min cues for RLE management.   PT instructed pt in supine therex: SAQ, heel slides, clam shells. Bridges, hip abduction. All completed x 12 BLE, AROM, min cues for remain in pain free range and for decreased speed of eccentric movement.   Patient returned to room and left sitting in [facility] with call bell in reach and all needs met.           Therapy Documentation Precautions:  Precautions Precautions: Fall Restrictions Weight Bearing Restrictions: No RLE Weight Bearing: Weight bearing as tolerated LLE Weight Bearing: Weight bearing as tolerated General:   Vital Signs:  Pain: Pain Assessment Pain Scale: 0-10 Pain Score: 6  Pain Type: Acute pain Pain Location: Hip Pain Orientation: Right Pain Descriptors / Indicators: Aching Pain Frequency: Intermittent Pain Onset: With Activity Patients Stated Pain Goal: 0 Pain Intervention(s): Medication (See eMAR);Repositioned Multiple Pain Sites: No Vision/Perception     Mobility:   Locomotion :    Trunk/Postural Assessment :    Balance:   Exercises:   Other Treatments:     Therapy/Group: Individual Therapy  Lorie Phenix 06/23/2018, 9:20 AM

## 2018-06-23 NOTE — Progress Notes (Signed)
Occupational Therapy Session Note  Patient Details  Name: Tyler [name] MRN: [id number] Date of Birth: Nov 26, 1928  Today's Date: 06/23/2018 OT Individual Time: 7622-6333 OT Individual Time Calculation (min): 47 min    Short Term Goals: Week 2:  OT Short Term Goal 1 (Week 2): STGs equal to LTGs set at supervision level.    Skilled Therapeutic Interventions/Progress Updates:    Pt completed transfer from bedside chair to wheelchair with supervision using the RW for support.  He was able to complete wheelchair mobility down to the therapy gym with supervision as well.  Once in the gym, had pt focus on sit to stand and standing balance while engaged in corn hole game.  He was able to stand with supervision and then ambulate over and pick up the bean bags by reaching to the floor with min instructional cueing for technique and supervision level assist.  He completed three intervals of this with rest breaks in between.  Finished session with return to the room and transferring to the recliner to rest.  Call button and phone in reach with alarm belt in place.    Therapy Documentation Precautions:  Precautions Precautions: Fall Restrictions Weight Bearing Restrictions: No RLE Weight Bearing: Weight bearing as tolerated LLE Weight Bearing: Weight bearing as tolerated  Pain: Pain Assessment Pain Scale: Faces Faces Pain Scale: Hurts a little bit Pain Type: Acute pain Pain Location: Hip Pain Orientation: Right Pain Descriptors / Indicators: Discomfort Pain Onset: With Activity Pain Intervention(s): Repositioned ADL: See Care Tool Section for some details of ADL  Therapy/Group: Individual Therapy  Tauriel Scronce OTR/L 06/23/2018, 3:43 PM

## 2018-06-23 NOTE — Progress Notes (Signed)
Social Work Patient ID: Tyler [name], male   DOB: May 22, 1928, 85 y.o.   MRN: [id number]   Met with pt and wife yesterday following conference to discuss targeted d/c date of 2/24 and supervision goals.  Wife obviously not pleased with this information and notes a few times that "the girl who brought him here said it would be 21 days."  Explained purpose and process of setting targets and that team feels that pt will have met goals by the first of next week.  She eventually calmed somewhat and states, "I'll work through it.  I'm sorry.  Just thought I had more time."   Will follow up again with them both tomorrow to check how progress is going and if they are feeling more prepared.  Linell Meldrum, LCSW

## 2018-06-24 ENCOUNTER — Inpatient Hospital Stay (HOSPITAL_COMMUNITY): Payer: Self-pay | Admitting: Occupational Therapy

## 2018-06-24 ENCOUNTER — Inpatient Hospital Stay (HOSPITAL_COMMUNITY): Payer: Self-pay

## 2018-06-24 ENCOUNTER — Inpatient Hospital Stay (HOSPITAL_COMMUNITY): Payer: Self-pay | Admitting: Physical Therapy

## 2018-06-24 LAB — GLUCOSE, CAPILLARY
GLUCOSE-CAPILLARY: 94 mg/dL (ref 70–99)
GLUCOSE-CAPILLARY: 188 mg/dL — AB (ref 70–99)
Glucose-Capillary: 65 mg/dL — ABNORMAL LOW (ref 70–99)
Glucose-Capillary: 130 mg/dL — ABNORMAL HIGH (ref 70–99)

## 2018-06-24 NOTE — Progress Notes (Signed)
Occupational Therapy Session Note  Patient Details  Name: Tyler [name] MRN: [id number] Date of Birth: [birth year]  Today's Date: 06/24/2018 OT Individual Time: 1000-1045 OT Individual Time Calculation (min): 45 min    Short Term Goals: Week 2:  OT Short Term Goal 1 (Week 2): STGs equal to LTGs set at supervision level.    Skilled Therapeutic Interventions/Progress Updates:    Pt;s spouse present for therapy session.  Pt was able to stand up and ambulate with RW for 100 ft before his R hip began hurting too much. He then self propelled w/c to ADL apt.   They have a tub that was converted to a shower with a long grab bar on back wall, sliding doors and a built in seat in L corner of tub with faucets on the R side.  Pt practiced stepping backwards over a simulated shower ledge and then using L hand for bar on back wall. Pt was trying to use his R hand for support on L wall and would benefit from a grab bar placed there.  Showed the suction cup grab bar made by [employer] to his wife and felt this could be a good option as pt will only need it for light support. Pt was able to enter and exit 2x with close S. Recommended to them to have [facility] practice this transfer with them prior to actually doing it themselves.  Pt then self propelled w/c back to room. Pt needed to toilet, so NT assisted pt in the room.    Therapy Documentation Precautions:  Precautions Precautions: Fall Restrictions Weight Bearing Restrictions: No RLE Weight Bearing: Weight bearing as tolerated LLE Weight Bearing: Weight bearing as tolerated   Pain: Pain Assessment Pain Scale: 0-10 Pain Score: 2  Faces Pain Scale: Hurts a little bit Pain Type: Acute pain Pain Location: Hip Pain Orientation: Right Pain Descriptors / Indicators: Discomfort Pain Onset: With Activity Pain Intervention(s): Repositioned    Therapy/Group: Individual Therapy  [place] 06/24/2018, 11:42 AM

## 2018-06-24 NOTE — Progress Notes (Signed)
Occupational Therapy Session Note  Patient Details  Name: MACAULAY REICHER MRN: [id number] Date of Birth: Apr 10, 1929  Today's Date: 06/24/2018 OT Individual Time: 6195-0932 OT Individual Time Calculation (min): 58 min    Short Term Goals: Week 2:  OT Short Term Goal 1 (Week 2): STGs equal to LTGs set at supervision level.    Skilled Therapeutic Interventions/Progress Updates:    Pt with increased anxiety to start session with pt in supine position when therapist entered.  He was immediately saying "I can't do it, I can't move Cambry Spampinato, I can't."  Pt given reassurance that he was OK and had been able to complete all tasks yesterday with min guard assist to supervision.  Encouraged and assisted to pt the EOB with min assist and then had him stand with the RW and ambulate to the walk-in shower.  Pt stating "your gonna have to pick me up off the floor".  Even though he was walking with min guard assist.  Pt removed clothing for shower and then completed bathing with min instructional cueing and close supervision.  After drying he transferred stand pivot to the wheelchair where therapist transitioned him to the sink for dressing tasks.  He completed all UB dressing with supervision and LB dressing sit to stand with min assist only for donning right foot soft AFO.  Finished session with transfer from the wheelchair to the recliner for pt eating breakfast.  Call button in reach and safety belt in place.    Therapy Documentation Precautions:  Precautions Precautions: Fall Restrictions Weight Bearing Restrictions: No RLE Weight Bearing: Weight bearing as tolerated LLE Weight Bearing: Weight bearing as tolerated   Pain: Pain Assessment Pain Scale: Faces Faces Pain Scale: Hurts a little bit Pain Type: Acute pain Pain Location: Hip Pain Orientation: Right Pain Descriptors / Indicators: Discomfort Pain Onset: With Activity Pain Intervention(s): Repositioned ADL: See Care Plan section for some  details of ADL  Therapy/Group: Individual Therapy  Briany Aye OTR/L 06/24/2018, 8:59 AM

## 2018-06-24 NOTE — Progress Notes (Signed)
Physical Therapy Session Note  Patient Details  Name: Tyler [name] MRN: [id number] Date of Birth: Jan 10, 1929  Today's Date: 06/24/2018 PT Individual Time: 6606-0045 PT Individual Time Calculation (min): 60 min   Short Term Goals: Week 2:  PT Short Term Goal 1 (Week 2): STG=LTG due to ELOS   Skilled Therapeutic Interventions/Progress Updates:   Pt received sitting in WC and agreeable to PT  Gait training with RW 178f x 1254f With supervision assist. Pt noted to have decreased step length and stance time on the RLE with increased distance.   Stair management training x 4 steps with CGA from PT for Safety, min cues for step to gait pattern leading with the LLE to reduce pain in the R pelvis   PT instructed pt in seated BUE and BLE therex.  Chest press to tap ball 2 x 1 min  Mid row 2 x 15  Lat pull down 2 x 15  LAQ, 2 x 12  hip abduction 2 x 10  Reciprocal marches. 2 x10  Moderate multimodal cues for proper speed of movement,  Improved eccentric control, decreased compensations, and to remain in pain free range.   Patient returned to [facility] and [facility] in reach and all needs met.          Therapy Documentation Precautions:  Precautions Precautions: Fall Restrictions Weight Bearing Restrictions: No RLE Weight Bearing: Weight bearing as tolerated LLE Weight Bearing: Weight bearing as tolerated Pain:   0/10 at rest     Therapy/Group: Individual Therapy  AuLorie Phenix/21/2020, 3:35 PM

## 2018-06-24 NOTE — Progress Notes (Signed)
[place] PHYSICAL MEDICINE & REHABILITATION PROGRESS NOTE  Subjective/Complaints: Patient seen laying in bed this morning.  He states he slept well overnight.  He states he is not doing well and that he is dying.  He states he is not going to make it until tomorrow.  Reminded patient and perform deep breathing exercises again with patient.  ROS: + Congestion, chest pain, weakness.  Denies diarrhea.  Objective: Vital Signs: Blood pressure (!) 148/55, pulse 75, temperature 98.2 F (36.8 C), temperature source Oral, resp. rate 14, height 5\' 11"  (1.803 m), weight 73 kg, SpO2 93 %. No results found. No results for input(s): WBC, HGB, HCT, PLT in the last 72 hours. Recent Labs    06/21/18 1046  NA 137  K 4.7  CL 105  CO2 20*  GLUCOSE 200*  BUN 40*  CREATININE 1.72*  CALCIUM 9.7    Physical Exam: BP (!) 148/55 (BP Location: Right Arm)   Pulse 75   Temp 98.2 F (36.8 C) (Oral)   Resp 14   Ht 5\' 11"  (1.803 m)   Wt 73 kg   SpO2 93%   BMI 22.45 kg/m  Constitutional: No distress . Vital signs reviewed. HENT: Normocephalic.  Atraumatic. Eyes: EOMI. No discharge. Cardiovascular: RRR.  [name]. Respiratory: CTA bilaterally.  Normal effort. GI: BS +. Non-distended. Musc: Tenderness to right pelvis, fluctuant with mood Neurological: He is alert and oriented.  HOH Processing delays, unchanged Motor:  Right lower extremity: 4-/5, (limited by participation and pain inhibition) Left lower extremity: 5/5 Skin: Warm and dry.  Intact. Psychiatric: Extremely anxious  Assessment/Plan: 1. Functional deficits secondary to pelvic fracture which require 3+ hours per day of interdisciplinary therapy in a comprehensive inpatient rehab setting.  Physiatrist is providing close team supervision and 24 hour management of active medical problems listed below.  Physiatrist and rehab team continue to assess barriers to discharge/monitor patient progress toward functional and medical  goals  Care Tool:  Bathing    Body parts bathed by patient: Right arm, Left arm, Chest, Abdomen, Front perineal area, Buttocks, Face, Right lower leg, Left upper leg, Right upper leg, Left lower leg         Bathing assist Assist Level: Contact Guard/Touching assist     Upper Body Dressing/Undressing Upper body dressing   What is the patient wearing?: Pull over shirt    Upper body assist Assist Level: Set up assist    Lower Body Dressing/Undressing Lower body dressing      What is the patient wearing?: Incontinence brief, Pants     Lower body assist Assist for lower body dressing: Contact Guard/Touching assist     Toileting Toileting    Toileting assist Assist for toileting: Moderate Assistance - Patient 50 - 74%     Transfers Chair/bed transfer  Transfers assist     Chair/bed transfer assist level: Minimal Assistance - Patient > 75%     Locomotion Ambulation   Ambulation assist      Assist level: Minimal Assistance - Patient > 75% Assistive device: Walker-rolling Max distance: 12'   Walk 10 feet activity   Assist  Walk 10 feet activity did not occur: Safety/medical concerns  Assist level: Supervision/Verbal cueing Assistive device: Walker-rolling   Walk 50 feet activity   Assist Walk 50 feet with 2 turns activity did not occur: Safety/medical concerns  Assist level: Supervision/Verbal cueing Assistive device: Walker-rolling    Walk 150 feet activity   Assist Walk 150 feet activity did not occur: Safety/medical  concerns         Walk 10 feet on uneven surface  activity   Assist Walk 10 feet on uneven surfaces activity did not occur: Safety/medical concerns   Assist level: Supervision/Verbal cueing Assistive device: Aeronautical engineer   Type of Wheelchair: Manual    Wheelchair assist level: Supervision/Verbal cueing Max wheelchair distance: 179ft    Wheelchair 50 feet with 2 turns  activity    Assist        Assist Level: Supervision/Verbal cueing   Wheelchair 150 feet activity     Assist Wheelchair 150 feet activity did not occur: Safety/medical concerns   Assist Level: Supervision/Verbal cueing     Medical Problem List and Plan: 1.  Functional decline secondary to Pelvic fracture and pain.  Continue CIR   CT head reviewed, hydrocephalus ex vacuo, ECG reviewed- no acute changes, troponin, UA negative.  Episode related to anxiety 2.  DVT Prophylaxis/Anticoagulation: Pharmaceutical: Lovenox 3. Pain Management: Continue tylenol qid with ultram prn. Wife does not want patient on any narcotics.  4. Anxiety disorder with panic attack: Continue Zoloft daily-timing of Zoloft changed by staff patient and family.   LCSW to follow for evaluation and support.   Coping mechanisms for anxiety, prolonged discussion with patient regarding coping mechanisms- have performed deep breathing exercises with patient, again on 2/21  Klonopin 0.25 twice daily as needed started on 2/19 5. Neuropsych: This patient is capable of making decisions on his own behalf. 6. Skin/Wound Care: Routine pressure relief measures.  7. Fluids/Electrolytes/Nutrition: Monitor I/O.  8. T2DM: Resumed lower dose Amaryl. Monitor BS ac/hs and use SSI for elevated BS. Added bedtime snack  Labile on 2/21 9. HTN: Monitor BP bid--continue Norvasc at bedtime and Cozaar in am.   Labile on 2/21  Monitor with increased mobility 10. Acute on chronic renal failure: Encourage fluid intake.   Creatinine 1.72 on 2/18  Labs ordered for Monday  Encourage fluids  Continue to monitor 11.  H/o CVA: On Plavix and Lipitor.  12. ABLA  Hemoglobin 11.6 on 2/13  Labs ordered for Monday  Continue to monitor 13. Thrombocytopenia:?  Resolved  Platelets 170 on 2/13  Continue to monitor   LOS: 9 days A FACE TO FACE EVALUATION WAS PERFORMED  Felina Tello Lorie Phenix 06/24/2018, 9:09 AM

## 2018-06-24 NOTE — Progress Notes (Signed)
Occupational Therapy Session Note  Patient Details  Name: Tyler [name] MRN: [id number] Date of Birth: [birth year]  Today's Date: 06/24/2018 OT Individual Time: 1305-1400 OT Individual Time Calculation (min): 55 min    Short Term Goals: Week 2:  OT Short Term Goal 1 (Week 2): STGs equal to LTGs set at supervision level.    Skilled Therapeutic Interventions/Progress Updates:    Pt resting in recliner upon arrival with wife present.  Offered wife the opportunity to walk with pt and practice shower transfers, in addition to donning Toe Up.  Wife declined stating she knew how to do everything but she was going to do things her way at home.  Pt amb with RW to ADL apartment and practiced stepping into/out of simulated walk in shower.  Pt amb to gym and engaged in variety of standing activities including bouncing ball against mini trampoline and tossing ball with another pt.  Pt completed all tasks at supervision level.  Pt returned to room and remained in recliner with all needs within reach and belt alarm activated.  Recommended that pt not walk in house without shoes 2/2 foot drop.  Pt's wife verbalized understanding.   Therapy Documentation Precautions:  Precautions Precautions: Fall Restrictions Weight Bearing Restrictions: No RLE Weight Bearing: Weight bearing as tolerated LLE Weight Bearing: Weight bearing as tolerated  Pain: Pt c/o slight pain in R hip but "tolerable"; RN Maudry Mayhew stated he was not scheduled for any pain meds at this time  Therapy/Group: Individual Therapy  Leroy Libman 06/24/2018, 2:15 PM

## 2018-06-25 LAB — GLUCOSE, CAPILLARY
GLUCOSE-CAPILLARY: 143 mg/dL — AB (ref 70–99)
Glucose-Capillary: 87 mg/dL (ref 70–99)
Glucose-Capillary: 154 mg/dL — ABNORMAL HIGH (ref 70–99)
Glucose-Capillary: 145 mg/dL — ABNORMAL HIGH (ref 70–99)

## 2018-06-25 NOTE — Progress Notes (Signed)
Tyler [name] is a 85 y.o. male 29-Apr-1929 [id number]  Subjective: Reports feeling well today. Acknowledges he "worries" at night  But is trying to control same with scripture recital and prayer. Denies problems or pain today.  Objective: Vital signs in last 24 hours: Temp:  [97.7 F (36.5 C)-98 F (36.7 C)] 98 F (36.7 C) (02/22 0824) Pulse Rate:  [62-69] 62 (02/22 0824) Resp:  [14] 14 (02/22 0528) BP: (121-152)/(56-65) 152/59 (02/22 0824) SpO2:  [97 %-99 %] 97 % (02/22 0528) Weight change:  Last BM Date: 06/24/18  Intake/Output from previous day: 02/21 0701 - 02/22 0700 In: 720 [P.O.:720] Out: 100 [Urine:100]  Physical Exam General: No apparent distress; HOH.  Pleasant with good insight and without perseveration or anxiety at present Lungs: Normal effort. Lungs clear to auscultation, no crackles or wheezes. Cardiovascular: Regular rate and rhythm, no edema Musculoskeletal:  Neurovascularly intact   Lab Results: BMET    Component Value Date/Time   NA 137 06/21/2018 1046   K 4.7 06/21/2018 1046   CL 105 06/21/2018 1046   CO2 20 (L) 06/21/2018 1046   GLUCOSE 200 (H) 06/21/2018 1046   BUN 40 (H) 06/21/2018 1046   CREATININE 1.72 (H) 06/21/2018 1046   CALCIUM 9.7 06/21/2018 1046   GFRNONAA 35 (L) 06/21/2018 1046   GFRAA 41 (L) 06/21/2018 1046   CBC    Component Value Date/Time   WBC 9.1 06/16/2018 0728   RBC 3.79 (L) 06/16/2018 0728   HGB 11.6 (L) 06/16/2018 0728   HCT 36.6 (L) 06/16/2018 0728   PLT 170 06/16/2018 0728   MCV 96.6 06/16/2018 0728   MCH 30.6 06/16/2018 0728   MCHC 31.7 06/16/2018 0728   RDW 13.1 06/16/2018 0728   LYMPHSABS 1.6 06/16/2018 0728   MONOABS 0.7 06/16/2018 0728   EOSABS 0.8 (H) 06/16/2018 0728   BASOSABS 0.1 06/16/2018 0728   CBG's (last 3):   Recent Labs    06/24/18 2159 06/25/18 0650 06/25/18 1136  GLUCAP 130* 87 154*   LFT's Lab Results  Component Value Date   ALT 25 06/16/2018   AST 17 06/16/2018   ALKPHOS 59  06/16/2018   BILITOT 1.0 06/16/2018    Studies/Results: No results found.  Medications:  I have reviewed the patient's current medications. Scheduled Medications: . acetaminophen  650 mg Oral Q6H  . amLODipine  2.5 mg Oral QHS  . atorvastatin  20 mg Oral Daily  . cholecalciferol  1,000 Units Oral QHS  . clopidogrel  75 mg Oral Q breakfast  . docusate sodium  100 mg Oral BID  . enoxaparin (LOVENOX) injection  40 mg Subcutaneous Q24H  . glimepiride  1 mg Oral Q breakfast  . insulin aspart  0-5 Units Subcutaneous QHS  . insulin aspart  0-9 Units Subcutaneous TID WC  . losartan  50 mg Oral Daily  . Melatonin  3 mg Oral QHS  . multivitamin with minerals  1 tablet Oral Daily  . pantoprazole  40 mg Oral Daily  . sertraline  100 mg Oral Daily  . vitamin B-12  1,000 mcg Oral Daily   PRN Medications: acetaminophen, alum & mag hydroxide-simeth, bisacodyl, camphor-menthol, clonazePAM, diphenhydrAMINE, guaiFENesin-dextromethorphan, methocarbamol, polyethylene glycol, prochlorperazine **OR** prochlorperazine **OR** prochlorperazine, sodium phosphate, traMADol, traZODone  Assessment/Plan: Principal Problem:   Pelvic fracture (HCC) Active Problems:   Acute blood loss anemia   AKI (acute kidney injury) ([place])   Diabetes mellitus type 2 in nonobese (HCC)   Benign essential HTN   Stage 3 chronic  kidney disease ([place])   Labile blood pressure   Panic attack   Anxiety state   1. Debility and functional impairment due to pelvic fracture - continue CIR therpies and support. 2. Anxiety with history of panic attacks. On Zoloft, ego and family support ongoing with low dose prn BZ available 3. DM2 - on low dose OHA with SSI 4. HTN - monitor and titrate meds as needed - continue same 5. ABLA due to #1 - no active bleeding - for recheck labs in AM  Length of stay, days: 10  Yaniyah Koors A. Asa Lente, MD 06/25/2018, 12:00 PM

## 2018-06-25 NOTE — Plan of Care (Signed)
  Problem: RH BOWEL ELIMINATION Goal: RH STG MANAGE BOWEL WITH ASSISTANCE Description STG Manage Bowel with Min Assistance.  Outcome: Progressing Goal: RH STG MANAGE BOWEL W/MEDICATION W/ASSISTANCE Description STG Manage Bowel with Medication with min.Assistance.  Outcome: Progressing   Problem: RH SKIN INTEGRITY Goal: RH STG SKIN FREE OF INFECTION/BREAKDOWN Description With min. Assist.  Outcome: Progressing Goal: RH STG MAINTAIN SKIN INTEGRITY WITH ASSISTANCE Description STG Maintain Skin Integrity  With min. Assistance.   Outcome: Progressing Goal: RH STG ABLE TO PERFORM INCISION/WOUND CARE W/ASSISTANCE Description STG Able To Perform Incision/Wound Care With min.Assistance.  Outcome: Progressing   Problem: RH SAFETY Goal: RH STG ADHERE TO SAFETY PRECAUTIONS W/ASSISTANCE/DEVICE Description STG Adhere to Safety Precautions With Min Assistance/Device.  Outcome: Progressing   Problem: RH PAIN MANAGEMENT Goal: RH STG PAIN MANAGED AT OR BELOW PT'S PAIN GOAL Description Patient will be pain free or less than 3  Outcome: Progressing   Problem: RH KNOWLEDGE DEFICIT GENERAL Goal: RH STG INCREASE KNOWLEDGE OF SELF CARE AFTER HOSPITALIZATION Description Patient/family will be able to describe safety precautions for care at home with cues. Patient/family will be able to verbalize medications taken, indication, and schedule with cues/handouts.  Outcome: Progressing   Problem: RH BLADDER ELIMINATION Goal: RH STG MANAGE BLADDER WITH ASSISTANCE Description STG Manage Bladder With Min Assistance  Outcome: Not Progressing Goal: RH STG MANAGE BLADDER WITH EQUIPMENT WITH ASSISTANCE Description STG Manage Bladder With Equipment  With min. Assistance   Outcome: Not Progressing

## 2018-06-26 ENCOUNTER — Inpatient Hospital Stay (HOSPITAL_COMMUNITY): Payer: Self-pay

## 2018-06-26 LAB — GLUCOSE, CAPILLARY
Glucose-Capillary: 101 mg/dL — ABNORMAL HIGH (ref 70–99)
Glucose-Capillary: 161 mg/dL — ABNORMAL HIGH (ref 70–99)
Glucose-Capillary: 92 mg/dL (ref 70–99)
Glucose-Capillary: 219 mg/dL — ABNORMAL HIGH (ref 70–99)

## 2018-06-26 NOTE — Progress Notes (Signed)
Physical Therapy Discharge Summary  Patient Details  Name: HIROTO SALTZMAN MRN: [id number] Date of Birth: 28-Aug-1928  Today's Date: 06/26/2018 PT Individual Time: 1300-1400 PT Individual Time Calculation (min): 60 min    Patient has met 8 of 8 long term goals due to improved activity tolerance, improved balance, increased strength, increased range of motion and decreased pain.  Patient to discharge at an ambulatory level Supervision.   Patient's care partner is independent to provide the necessary physical assistance at discharge.   All goals met.   Recommendation:  Patient will benefit from ongoing skilled PT services in home health setting to continue to advance safe functional mobility, address ongoing impairments in balance, strength, gait, transfers, stair management, safety, and minimize fall risk.  Equipment: foot-up brace  Reasons for discharge: treatment goals met and discharge from hospital  Patient/family agrees with progress made and goals achieved: Yes  PT treatment Interventions: Pt seated in w/c upon PT arrival, agreeable to therapy tx and reports pain 8/10 in R hip secondary to fracture, limited ambulation for pain relief. Therapist provided verbal education and recommendations to wife regarding techniques for accessing home and steps, wife declines hands on participation and reports "I have seen him do it enough times." Pt ambulated to the ortho gym x 150 ft with RW and supervision, verbal cues for RW management. Pt performed car transfer this session stand pivot with RW and verbal cues for techniques. Pt propelled w/c to the gym x150 ft using B UEs working on endurance and activity tolerance. Pt ascended/descended 4 steps this session using R rail with B UE support on single rail, CGA with verbal cues for step to pattern. Pt propelled w/c to rehab apartment and ambulated 2 x 10 ft using RW and supervision to perform bed transfer. Pt performed bed mobility while on apartment  bed Mod I. Pt propelled w/c back to gym with supervision. Pt used nustep this session for global strengthening and endurance on resistance level 6, x 6 minutes. Pt transported back to room and left seated in w/c with needs in reach and wife present.   PT Discharge Precautions/Restrictions Precautions Precautions: Fall Precaution Comments: extremely HOH Restrictions Weight Bearing Restrictions: Yes RLE Weight Bearing: Weight bearing as tolerated LLE Weight Bearing: Weight bearing as tolerated Pain Pain Assessment Pain Scale: 0-10 Pain Score: 0-No pain Cognition Overall Cognitive Status: Within Functional Limits for tasks assessed Arousal/Alertness: Awake/alert Orientation Level: Oriented X4 Attention: Sustained Sustained Attention: Appears intact Safety/Judgment: Appears intact Sensation Sensation Light Touch: Appears Intact Proprioception: Appears Intact Coordination Gross Motor Movements are Fluid and Coordinated: No Fine Motor Movements are Fluid and Coordinated: Yes Coordination and Movement Description: Pt with slight tremor in bilateral hands, overall AROM and strength WFL for age.  Motor  Motor Motor - Discharge Observations: Pt still presents with some generalized weakness and acute pain in R hip d/t pelvic fx   Mobility Bed Mobility Bed Mobility: Supine to Sit;Sit to Supine Supine to Sit: Supervision/Verbal cueing Sit to Supine: Supervision/Verbal cueing Transfers Transfers: Sit to Stand;Stand Pivot Transfers;Stand to Sit Sit to Stand: Supervision/Verbal cueing Stand to Sit: Supervision/Verbal cueing Stand Pivot Transfers: Supervision/Verbal cueing Stand Pivot Transfer Details: Verbal cues for precautions/safety Transfer (Assistive device): Rolling walker Locomotion  Gait Ambulation: Yes Gait Assistance: Supervision/Verbal cueing Gait Distance (Feet): 150 Feet Assistive device: Rolling walker Gait Assistance Details: Verbal cues for safe use of DME/AE;Visual  cues for safe use of DME/AE;Verbal cues for precautions/safety Gait Gait: Yes Gait Pattern: Impaired Gait Pattern: Step-to  pattern Gait velocity: decreased  Trunk/Postural Assessment  Cervical Assessment Cervical Assessment: Within Functional Limits Thoracic Assessment Thoracic Assessment: Within Functional Limits Lumbar Assessment Lumbar Assessment: Within Functional Limits Postural Control Postural Control: Within Functional Limits  Balance Balance Balance Assessed: Yes Static Sitting Balance Static Sitting - Level of Assistance: 6: Modified independent (Device/Increase time) Dynamic Sitting Balance Dynamic Sitting - Level of Assistance: 5: Stand by assistance Static Standing Balance Static Standing - Level of Assistance: 5: Stand by assistance Dynamic Standing Balance Dynamic Standing - Level of Assistance: 5: Stand by assistance Extremity Assessment  RLE Assessment RLE Assessment: Exceptions to [facility] RLE Strength Right Hip Flexion: 3/5 Right Hip ABduction: 4+/5 Right Hip ADduction: 3+/5 Right Knee Flexion: 4/5 Right Knee Extension: 4/5 Right Ankle Dorsiflexion: 3+/5 LLE Assessment LLE Assessment: Within Functional Limits    Netta Corrigan, PT, DPT 06/26/2018, 1:24 PM

## 2018-06-26 NOTE — Progress Notes (Signed)
CURRIE DENNIN is a 85 y.o. male 1928/06/25 [id number]  Subjective: Reports feeling well today. Denies problems or pain today. Still looking for better ways to  manage his "worry" habits  Objective: Vital signs in last 24 hours: Temp:  [97.7 F (36.5 C)-98.4 F (36.9 C)] 97.7 F (36.5 C) (02/23 0515) Pulse Rate:  [66-67] 67 (02/23 0515) Resp:  [14-18] 14 (02/23 0515) BP: (139-155)/(53-67) 155/67 (02/23 0515) SpO2:  [97 %-100 %] 97 % (02/23 0515) Weight change:  Last BM Date: 06/24/18  Intake/Output from previous day: 02/22 0701 - 02/23 0700 In: 120 [P.O.:120] Out: -   Physical Exam General: No apparent distress; HOH.  Pleasant  Lungs: Normal effort. Lungs clear to auscultation, no crackles or wheezes. Cardiovascular: Regular rate and rhythm, no edema Musculoskeletal:  Neurovascularly intact   Lab Results: BMET    Component Value Date/Time   NA 137 06/21/2018 1046   K 4.7 06/21/2018 1046   CL 105 06/21/2018 1046   CO2 20 (L) 06/21/2018 1046   GLUCOSE 200 (H) 06/21/2018 1046   BUN 40 (H) 06/21/2018 1046   CREATININE 1.72 (H) 06/21/2018 1046   CALCIUM 9.7 06/21/2018 1046   GFRNONAA 35 (L) 06/21/2018 1046   GFRAA 41 (L) 06/21/2018 1046   CBC    Component Value Date/Time   WBC 9.1 06/16/2018 0728   RBC 3.79 (L) 06/16/2018 0728   HGB 11.6 (L) 06/16/2018 0728   HCT 36.6 (L) 06/16/2018 0728   PLT 170 06/16/2018 0728   MCV 96.6 06/16/2018 0728   MCH 30.6 06/16/2018 0728   MCHC 31.7 06/16/2018 0728   RDW 13.1 06/16/2018 0728   LYMPHSABS 1.6 06/16/2018 0728   MONOABS 0.7 06/16/2018 0728   EOSABS 0.8 (H) 06/16/2018 0728   BASOSABS 0.1 06/16/2018 0728   CBG's (last 3):   Recent Labs    06/25/18 1614 06/25/18 2124 06/26/18 0650  GLUCAP 145* 143* 101*   LFT's Lab Results  Component Value Date   ALT 25 06/16/2018   AST 17 06/16/2018   ALKPHOS 59 06/16/2018   BILITOT 1.0 06/16/2018    Studies/Results: No results found.  Medications:  I have reviewed  the patient's current medications. Scheduled Medications: . acetaminophen  650 mg Oral Q6H  . amLODipine  2.5 mg Oral QHS  . atorvastatin  20 mg Oral Daily  . cholecalciferol  1,000 Units Oral QHS  . clopidogrel  75 mg Oral Q breakfast  . docusate sodium  100 mg Oral BID  . enoxaparin (LOVENOX) injection  40 mg Subcutaneous Q24H  . glimepiride  1 mg Oral Q breakfast  . insulin aspart  0-5 Units Subcutaneous QHS  . insulin aspart  0-9 Units Subcutaneous TID WC  . losartan  50 mg Oral Daily  . Melatonin  3 mg Oral QHS  . multivitamin with minerals  1 tablet Oral Daily  . pantoprazole  40 mg Oral Daily  . sertraline  100 mg Oral Daily  . vitamin B-12  1,000 mcg Oral Daily   PRN Medications: acetaminophen, alum & mag hydroxide-simeth, bisacodyl, camphor-menthol, clonazePAM, diphenhydrAMINE, guaiFENesin-dextromethorphan, methocarbamol, polyethylene glycol, prochlorperazine **OR** prochlorperazine **OR** prochlorperazine, sodium phosphate, traMADol, traZODone  Assessment/Plan: Principal Problem:   Pelvic fracture (HCC) Active Problems:   Acute blood loss anemia   AKI (acute kidney injury) ([place])   Diabetes mellitus type 2 in nonobese (HCC)   Benign essential HTN   Stage 3 chronic kidney disease ([place])   Labile blood pressure   Panic attack   Anxiety  state   1. Debility and functional impairment due to pelvic fracture - continue CIR therpies and support. 2. Anxiety with history of panic attacks. On Zoloft, ego and family support ongoing with low dose prn BZ available 3. DM2 - on low dose OHA with SSI 4. HTN - monitor and titrate meds as needed - continue same 5. ABLA due to #1 - no active bleeding -  Length of stay, days: 11  Valerie A. Asa Lente, MD 06/26/2018, 9:27 AM

## 2018-06-26 NOTE — Plan of Care (Signed)
  Problem: RH BOWEL ELIMINATION Goal: RH STG MANAGE BOWEL WITH ASSISTANCE Description STG Manage Bowel with Min Assistance.  Outcome: Progressing Goal: RH STG MANAGE BOWEL W/MEDICATION W/ASSISTANCE Description STG Manage Bowel with Medication with min.Assistance.  Outcome: Progressing   Problem: RH KNOWLEDGE DEFICIT GENERAL Goal: RH STG INCREASE KNOWLEDGE OF SELF CARE AFTER HOSPITALIZATION Description Patient/family will be able to describe safety precautions for care at home with cues. Patient/family will be able to verbalize medications taken, indication, and schedule with cues/handouts.  Outcome: Progressing   Problem: RH BLADDER ELIMINATION Goal: RH STG MANAGE BLADDER WITH ASSISTANCE Description STG Manage Bladder With Min Assistance  Outcome: Completed/Met Goal: RH STG MANAGE BLADDER WITH EQUIPMENT WITH ASSISTANCE Description STG Manage Bladder With Equipment  With min. Assistance   Outcome: Completed/Met   Problem: RH SKIN INTEGRITY Goal: RH STG SKIN FREE OF INFECTION/BREAKDOWN Description With min. Assist.  Outcome: Completed/Met Goal: RH STG MAINTAIN SKIN INTEGRITY WITH ASSISTANCE Description STG Maintain Skin Integrity  With min. Assistance.   Outcome: Completed/Met Goal: RH STG ABLE TO PERFORM INCISION/WOUND CARE W/ASSISTANCE Description STG Able To Perform Incision/Wound Care With min.Assistance.  Outcome: Completed/Met   Problem: RH SAFETY Goal: RH STG ADHERE TO SAFETY PRECAUTIONS W/ASSISTANCE/DEVICE Description STG Adhere to Safety Precautions With Min Assistance/Device.  Outcome: Completed/Met   Problem: RH PAIN MANAGEMENT Goal: RH STG PAIN MANAGED AT OR BELOW PT'S PAIN GOAL Description Patient will be pain free or less than 3  Outcome: Completed/Met

## 2018-06-26 NOTE — Progress Notes (Signed)
Occupational Therapy Discharge Summary  Patient Details  Name: Tyler [name] MRN: [id number] Date of Birth: January 25, 1929  Today's Date: 06/26/2018 OT Individual Time: 7564-3329 OT Individual Time Calculation (min): 60 min    Patient has met 10 of 10 long term goals due to improved activity tolerance, improved balance, ability to compensate for deficits, functional use of  RIGHT lower extremity, improved awareness and improved coordination.  Patient to discharge at overall Supervision level.  Patient's care partner is independent to provide the necessary physical assistance at discharge. Pt's wife refused family education session that was arranged, stating she will "do it her own way at home." Pt remains limited in the acute pain in his R hip that affects his balance overall, justifying the need for (S) during ADLs and functional mobility, which his wife understands and agreed to provide.   Reasons goals not met: Pt has met all goals in his OT POC.   Recommendation:  Patient will benefit from ongoing skilled OT services in home health setting to continue to advance functional skills in the area of BADL.  Equipment: TTB  Reasons for discharge: treatment goals met and discharge from hospital  Patient/family agrees with progress made and goals achieved: Yes   Skilled OT Treatment: Session focused on d/c planning and bathing/dressing at shower level. Pt with no reports of pain at supine, however pain as described below when ambulating. Pt completed 10 ft of functional mobility into bathroom and into walk in shower. He sat on TTB and with use of grab bars doffed all clothing. Several cues provided for maximizing safety with dynamic balance deficits. Pt completed bathing and dressing as described below. Pt returned to w/c and completed oral care at sink with set up assist. Pt left sitting up in w/c with wife present.   OT Discharge Precautions/Restrictions  Precautions Precautions:  Fall Precaution Comments: extremely HOH Restrictions Weight Bearing Restrictions: Yes RLE Weight Bearing: Weight bearing as tolerated LLE Weight Bearing: Weight bearing as tolerated  Pain Pain Assessment Pain Scale: 0-10 Pain Score: 0-No pain Pain Type: Acute pain Pain Location: Hip Pain Orientation: Right Pain Descriptors / Indicators: Aching Pain Onset: With Activity Pain Intervention(s): Shower;Relaxation;Rest ADL ADL Eating: Independent Where Assessed-Eating: Wheelchair Grooming: Supervision/safety Where Assessed-Grooming: Wheelchair Upper Body Bathing: Supervision/safety Where Assessed-Upper Body Bathing: Wheelchair Lower Body Bathing: Supervision/safety Where Assessed-Lower Body Bathing: Shower Upper Body Dressing: Supervision/safety Where Assessed-Upper Body Dressing: Wheelchair Lower Body Dressing: Supervision/safety Where Assessed-Lower Body Dressing: Wheelchair Toileting: Supervision/safety Where Assessed-Toileting: Glass blower/designer: Close supervision Armed forces technical officer Method: Counselling psychologist: Bedside commode, Energy manager: Close supervision Social research officer, government Method: Heritage manager: Grab bars, Shower seat without back Vision Baseline Vision/History: Wears glasses Wears Glasses: At all times Patient Visual Report: No change from baseline Vision Assessment?: No apparent visual deficits Perception  Perception: Within Functional Limits Praxis Praxis: Intact Cognition Overall Cognitive Status: Within Functional Limits for tasks assessed Arousal/Alertness: Awake/alert Orientation Level: Oriented X4 Attention: Sustained Sustained Attention: Appears intact Memory: Appears intact Awareness: Appears intact Problem Solving: Appears intact Safety/Judgment: Appears intact Sensation Sensation Light Touch: Appears Intact Hot/Cold: Appears Intact Proprioception: Appears  Intact Stereognosis: Appears Intact Coordination Gross Motor Movements are Fluid and Coordinated: No Fine Motor Movements are Fluid and Coordinated: Yes Coordination and Movement Description: Pt with slight tremor in bilateral hands, overall AROM and strength WFL for age.  Motor  Motor Motor: Other (comment) Motor - Discharge Observations: Pt still presents with some generalized weakness and acute pain  in R hip d/t pelvic fx  Mobility  Bed Mobility Bed Mobility: Supine to Sit Supine to Sit: Supervision/Verbal cueing Transfers Sit to Stand: Supervision/Verbal cueing  Trunk/Postural Assessment  Cervical Assessment Cervical Assessment: Within Functional Limits Thoracic Assessment Thoracic Assessment: Within Functional Limits Lumbar Assessment Lumbar Assessment: Within Functional Limits Postural Control Postural Control: Within Functional Limits  Balance Balance Balance Assessed: Yes Static Sitting Balance Static Sitting - Balance Support: Feet supported;Bilateral upper extremity supported Static Sitting - Level of Assistance: 6: Modified independent (Device/Increase time) Dynamic Sitting Balance Dynamic Sitting - Balance Support: During functional activity;Feet supported Dynamic Sitting - Level of Assistance: 5: Stand by assistance Sitting balance - Comments: Pt able to dynamically reach while sitting with no LOB Static Standing Balance Static Standing - Balance Support: Bilateral upper extremity supported;During functional activity Static Standing - Level of Assistance: 5: Stand by assistance Dynamic Standing Balance Dynamic Standing - Balance Support: During functional activity;Bilateral upper extremity supported Dynamic Standing - Level of Assistance: 5: Stand by assistance Extremity/Trunk Assessment RUE Assessment RUE Assessment: Within Functional Limits LUE Assessment LUE Assessment: Within Functional Limits   Curtis Sites 06/26/2018, 12:04 PM

## 2018-06-27 LAB — CBC
HCT: 34.6 % — ABNORMAL LOW (ref 39.0–52.0)
Hemoglobin: 11.2 g/dL — ABNORMAL LOW (ref 13.0–17.0)
MCH: 31.5 pg (ref 26.0–34.0)
MCHC: 32.4 g/dL (ref 30.0–36.0)
MCV: 97.2 fL (ref 80.0–100.0)
NRBC: 0 % (ref 0.0–0.2)
Platelets: 249 10*3/uL (ref 150–400)
RBC: 3.56 MIL/uL — AB (ref 4.22–5.81)
RDW: 13.2 % (ref 11.5–15.5)
WBC: 8.5 10*3/uL (ref 4.0–10.5)

## 2018-06-27 LAB — BASIC METABOLIC PANEL
ANION GAP: 7 (ref 5–15)
BUN: 32 mg/dL — ABNORMAL HIGH (ref 8–23)
CO2: 21 mmol/L — ABNORMAL LOW (ref 22–32)
Calcium: 9.4 mg/dL (ref 8.9–10.3)
Chloride: 110 mmol/L (ref 98–111)
Creatinine, Ser: 1.46 mg/dL — ABNORMAL HIGH (ref 0.61–1.24)
GFR calc Af Amer: 49 mL/min — ABNORMAL LOW (ref >60)
GFR calc non Af Amer: 43 mL/min — ABNORMAL LOW (ref >60)
Glucose, Bld: 93 mg/dL (ref 70–99)
Potassium: 4.8 mmol/L (ref 3.5–5.1)
Sodium: 138 mmol/L (ref 135–145)

## 2018-06-27 LAB — GLUCOSE, CAPILLARY: Glucose-Capillary: 93 mg/dL (ref 70–99)

## 2018-06-27 MED ORDER — ATORVASTATIN CALCIUM 20 MG PO TABS: 20.0000 mg | ORAL_TABLET | Freq: Every day | ORAL | 0 refills | Status: DC

## 2018-06-27 MED ORDER — LOSARTAN POTASSIUM 50 MG PO TABS: 50.0000 mg | ORAL_TABLET | Freq: Every day | ORAL | 0 refills | Status: DC

## 2018-06-27 MED ORDER — AMLODIPINE BESYLATE 2.5 MG PO TABS: 2.5000 mg | ORAL_TABLET | Freq: Every day | ORAL | 0 refills | Status: DC

## 2018-06-27 MED ORDER — GLIMEPIRIDE 1 MG PO TABS: 1.0000 mg | ORAL_TABLET | Freq: Every day | ORAL | 0 refills | Status: DC

## 2018-06-27 MED ORDER — CAMPHOR-MENTHOL 0.5-0.5 % EX LOTN: TOPICAL_LOTION | CUTANEOUS | 0 refills | Status: DC | PRN

## 2018-06-27 MED ORDER — ACETAMINOPHEN 325 MG PO TABS: 650.0000 mg | ORAL_TABLET | Freq: Four times a day (QID) | ORAL | Status: DC

## 2018-06-27 MED ORDER — PANTOPRAZOLE SODIUM 40 MG PO TBEC: 40.0000 mg | DELAYED_RELEASE_TABLET | Freq: Every day | ORAL | 0 refills | Status: DC

## 2018-06-27 MED ORDER — ADULT MULTIVITAMIN W/MINERALS CH: 1.0000 | ORAL_TABLET | Freq: Every day | ORAL | Status: DC

## 2018-06-27 NOTE — Progress Notes (Signed)
Pt. And his wife got d/c instructions and equipment.Pt. ready to go home.

## 2018-06-27 NOTE — Progress Notes (Signed)
Social Work  Discharge Note  The overall goal for the admission was met for:   Discharge location: Yes - home with wife to provide 24/7 supervision  Length of Stay: Yes - 12 days  Discharge activity level: Yes - supervision overall  Home/community participation: Yes  Services provided included: MD, RD, PT, OT, RN, TR, Pharmacy and [place]:  [employer]  Follow-up services arranged: Home Health: PT, OT via [facility], DME: 3n1 commode via [facility] and Patient/Family has no preference for HH/DME agencies  Comments (or additional information):  Patient/Family verbalized understanding of follow-up arrangements: Yes  Individual responsible for coordination of the follow-up plan: pt  Confirmed correct DME delivered: Paula Busenbark 06/27/2018    Ajai Terhaar

## 2018-06-27 NOTE — Discharge Summary (Addendum)
Physician Discharge Summary  Patient ID: Tyler [name] MRN: [id number] DOB/AGE: 11/29/1928 85 y.o.  Admit date: 06/15/2018 Discharge date: 06/27/2018  Discharge Diagnoses:  Principal Problem:   Pelvic fracture [facility]) Active Problems:   Acute blood loss anemia   AKI (acute kidney injury) ([place])   Diabetes mellitus type 2 in nonobese (HCC)   Benign essential HTN   Stage 3 chronic kidney disease ([place])   Labile blood pressure   Panic attack   Anxiety state   Discharged Condition: stable   Significant Diagnostic Studies: Ct Head Wo Contrast  Result Date: 06/20/2018 CLINICAL DATA:  Bilateral lower extremity weakness. EXAM: CT HEAD WITHOUT CONTRAST TECHNIQUE: Contiguous axial images were obtained from the base of the skull through the vertex without intravenous contrast. COMPARISON:  CT scan of June 28, 2016. FINDINGS: Brain: Mild diffuse cortical atrophy is noted. Mild chronic ischemic white matter disease is noted. No mass effect or midline shift is noted. Ventricular size is within normal limits. There is no evidence of mass lesion, hemorrhage or acute infarction. Vascular: No hyperdense vessel or unexpected calcification. Skull: Normal. Negative for fracture or focal lesion. Sinuses/Orbits: No acute finding. Other: None. IMPRESSION: Mild diffuse cortical atrophy. Mild chronic ischemic white matter disease. No acute intracranial abnormality seen. Electronically Signed   By: Marijo Conception, M.D.   On: 06/20/2018 09:04   Ct Hip Right Wo Contrast  Result Date: 06/12/2018 CLINICAL DATA:  Right hip pain after fall at church. EXAM: CT OF THE RIGHT HIP WITHOUT CONTRAST TECHNIQUE: Multidetector CT imaging of the right hip was performed according to the standard protocol. Multiplanar CT image reconstructions were also generated. COMPARISON:  Radiographs from 06/12/2018 FINDINGS: Bones/Joint/Cartilage Nondisplaced fracture of the right sacral ala, image 34/11. Nondisplaced fracture of the right  superior pubic ramus, image 75/11. Nondisplaced fracture of the right inferior pubic ramus, image 100/11. Right hip screw with IM rod noted. No femur fracture is observed in the visualized part of the femur, we included down to the distal metaphysis but not quite all the way through the stem of the implant. There is a single distal interlocking screw noted. Spurring along the lesser trochanter. Degenerative loss of intervertebral disc height at L4-5. Ligaments Suboptimally assessed by CT. Muscles and Tendons Unremarkable Soft tissues Aortoiliac atherosclerotic vascular disease. Prior lymph node dissection in the right hemipelvis. Suspected prostatectomy. IMPRESSION: 1. Acute nondisplaced fractures of the right sacral ala, right superior pubic ramus, and right inferior pubic ramus. 2. Degenerative loss of intervertebral disc height at L4-5. 3.  Aortic Atherosclerosis (ICD10-I70.0). Electronically Signed   By: Van Clines M.D.   On: 06/12/2018 16:24   Dg Hip Unilat W Or Wo Pelvis 2-3 Views Right  Result Date: 06/12/2018 CLINICAL DATA:  Patient status post fall.  Right leg pain. EXAM: DG HIP (WITH OR WITHOUT PELVIS) 2-3V RIGHT COMPARISON:  None. FINDINGS: Intramedullary rod within the right femur. No evidence for acute displaced femur fracture. Well corticated ossific density adjacent to the lesser tuberosity. Regional soft tissues are unremarkable. Hardware appears intact. Pelvic surgical clips. Lumbar spine degenerative changes. SI joints unremarkable. Stool throughout the colon. IMPRESSION: No evidence for acute displaced fracture. Well corticated ossific density adjacent to the lesser trochanter, potentially sequelae of prior trauma. Recommend correlation for point tenderness to exclude the possibility of avulsion injury. Electronically Signed   By: Lovey Newcomer M.D.   On: 06/12/2018 14:24    Labs:  Basic Metabolic Panel: BMP Latest Ref Rng & Units 06/27/2018 06/21/2018 06/16/2018  Glucose 70 - 99 mg/dL  93 200(H) 134(H)  BUN 8 - 23 mg/dL 32(H) 40(H) 35(H)  Creatinine 0.61 - 1.24 mg/dL 1.46(H) 1.72(H) 1.70(H)  Sodium 135 - 145 mmol/L 138 137 138  Potassium 3.5 - 5.1 mmol/L 4.8 4.7 4.5  Chloride 98 - 111 mmol/L 110 105 106  CO2 22 - 32 mmol/L 21(L) 20(L) 23  Calcium 8.9 - 10.3 mg/dL 9.4 9.7 9.4    CBC: CBC Latest Ref Rng & Units 06/27/2018 06/16/2018 06/15/2018  WBC 4.0 - 10.5 K/uL 8.5 9.1 9.9  Hemoglobin 13.0 - 17.0 g/dL 11.2(L) 11.6(L) 10.9(L)  Hematocrit 39.0 - 52.0 % 34.6(L) 36.6(L) 32.3(L)  Platelets 150 - 400 K/uL 249 170 145(L)    CBG: Recent Labs  Lab 06/26/18 0650 06/26/18 1151 06/26/18 1632 06/26/18 2126 06/27/18 0626  GLUCAP 101* 161* 92 219* 93    Brief HPI:   Tyler [name] is an 85 year old male with history T2DM, prostate cancer, HTN, CVA/TIA; who was admitted on 06/12/2018 after a fall with onset of right hip and groin pain.  CT pelvis done revealing nondisplaced fracture of right sacral alae and inferior/superior pubic rami.  Dr. Doran Durand consulted for input and recommended WBAT.  Patient has had issues with acute on chronic renal failure as well as acute blood loss anemia with drop in hemoglobin to 10.9 as well as thrombocytopenia with platelets down to 145.  Therapies initiated and patient was noted to be limited by pain, weakness and balance deficits.  CIR was recommended due to functional decline   Hospital Course: Tyler [name] was admitted to rehab 06/15/2018 for inpatient therapies to consist of PT and OT at least three hours five days a week. Past admission physiatrist, therapy team and rehab RN have worked together to provide customized collaborative inpatient rehab.  Blood pressures were monitored on twice daily basis and is showing improvement with and controlled on Norvasc as well as Cozaar.  Intermittent elevation noted likely due to pain physiology.  Wife declined use of any narcotics and Tylenol has been scheduled on 4 times daily for pain management.  He has  had bouts of elevated levels of anxiety and team has been providing ego support during during the stay.  CT head ordered on 2/17 after an anxiety episode and due to due to concerns of neurological event.  CT revealed mild diffuse cortical atrophy without acute changes.  Wife has been present during the day to provide psychological support also.  Amaryl was added at 1 mg daily to help manage diabetes.  Blood sugars have been monitored on AC/HS basis and have been controlled overall.  Follow-up CBC shows that thrombocytopenia has resolved and H&H is slowly improving.  Renal status has been monitored for stability.  Acute on chronic renal failure is slowly improving.  His p.o. intake has been good and he is continent of bowel and bladder.  He will continue to receive follow-up home health PT and home health OT by [facility] after discharge   Rehab course: During patient's stay in rehab weekly team conferences were held to monitor patient's progress, set goals and discuss barriers to discharge. At admission, patient required mod assist with mobility and basic self care tasks.  He has had improvement in activity tolerance, balance, postural control as well as ability to compensate for deficits.  He is able to complete ADL tasks at supervision level.  He requires supervision for transfers and to ambulate 120-150' with rolling walker.  Wife  declined family education as she felt that she could provide care needed after discharge   Disposition: Home  Diet: diabetic diet.   Special Instructions: 1. Monitor blood sugars 1-2 times a day before meals. 2. Continue HEP. Needs supervision with all activity.  Discharge Instructions    Ambulatory referral to Physical Medicine Rehab   Complete by:  As directed    1-2 weeks transitional care appt     Allergies as of 06/27/2018      Reactions   Sulfa Drugs Cross Reactors Anaphylaxis, Rash   Lungs fill with fluid      Medication List    STOP taking  these medications   HYDROcodone-acetaminophen 5-325 MG tablet Commonly known as:  NORCO/VICODIN   senna 8.6 MG Tabs tablet Commonly known as:  SENOKOT     TAKE these medications   acetaminophen 325 MG tablet Commonly known as:  TYLENOL Take 2 tablets (650 mg total) by mouth every 6 (six) hours.   amLODipine 2.5 MG tablet Commonly known as:  NORVASC Take 1 tablet (2.5 mg total) by mouth at bedtime.   atorvastatin 20 MG tablet Commonly known as:  LIPITOR Take 1 tablet (20 mg total) by mouth daily.   camphor-menthol lotion Commonly known as:  SARNA Apply topically as needed for itching.   cholecalciferol 1000 units tablet Commonly known as:  VITAMIN D Take 1,000 Units by mouth at bedtime.   clopidogrel 75 MG tablet Commonly known as:  PLAVIX Take 1 tablet (75 mg total) by mouth daily with breakfast.   docusate sodium 100 MG capsule Commonly known as:  COLACE Take 1 capsule (100 mg total) by mouth 2 (two) times daily.   Flaxseed Oil 1000 MG Caps Take 1,000 mg by mouth at bedtime.   glimepiride 1 MG tablet Commonly known as:  AMARYL Take 1 tablet (1 mg total) by mouth daily with breakfast. Start taking on:  June 28, 2018 What changed:    medication strength  how much to take  when to take this   losartan 50 MG tablet Commonly known as:  COZAAR Take 1 tablet (50 mg total) by mouth daily.   MELATONIN PO Take 1 tablet by mouth at bedtime.   multivitamin with minerals Tabs tablet Take 1 tablet by mouth daily. Start taking on:  June 28, 2018   ONE TOUCH ULTRA TEST test strip Generic drug:  glucose blood Notes to patient:  One to twice a day before meals   onetouch ultrasoft lancets   pantoprazole 40 MG tablet Commonly known as:  PROTONIX Take 1 tablet (40 mg total) by mouth daily.   sertraline 50 MG tablet Commonly known as:  ZOLOFT Take 100 mg by mouth daily.   vitamin B-12 1000 MCG tablet Commonly known as:  CYANOCOBALAMIN Take 1,000 mcg  by mouth daily.      Follow-up Information    [name], MD. Schedule an appointment as soon as possible for a visit in 2 week(s).   Specialty:  Internal Medicine Why:  for hospital follow up  Contact information: [place] [address] [phone]        [name], MD Follow up.   Specialty:  Physical Medicine and Rehabilitation Why:  office will call you with follow up appointment Contact information: [address] [place] [address] [phone]        [name], MD. Call.   Specialty:  Orthopedic Surgery Why:  for follow up appointent  Contact information: [address] [place] [address] [phone]           Signed: Bary Leriche 06/27/2018, 3:27 PM Patient seen and examined by me on day of discharge. Delice Lesch, MD, ABPMR

## 2018-06-27 NOTE — Discharge Instructions (Signed)
Inpatient Rehab Discharge Instructions  Tyler [name] Discharge date and time: 06/27/18   Activities/Precautions/ Functional Status: Activity: activity as tolerated Diet: diabetic diet Wound Care: none needed   Functional status:  ___ No restrictions     ___ Walk up steps independently _X__ 24/7 supervision/assistance   ___ Walk up steps with assistance ___ Intermittent supervision/assistance  ___ Bathe/dress independently ___ Walk with walker     ___ Bathe/dress with assistance ___ Walk Independently    ___ Shower independently ___ Walk with assistance    ___ Shower with assistance ___ No alcohol     ___ Return to work/school ________  Special Instructions:   COMMUNITY REFERRALS UPON DISCHARGE:    Home Health:   PT     OT                     Agency:  [facility] Phone:  [phone]   Medical Equipment/Items Ordered:  3n1 commode                                                      Agency/Supplier:  [facility] @ [phone]     My questions have been answered and I understand these instructions. I will adhere to these goals and the provided educational materials after my discharge from the hospital.  Patient/Caregiver Signature _______________________________ Date __________  Clinician Signature _______________________________________ Date __________  Please bring this form and your medication list with you to all your follow-up doctor's appointments.

## 2018-06-29 ENCOUNTER — Telehealth: Payer: Self-pay

## 2018-06-29 NOTE — Telephone Encounter (Signed)
Transitional Care call  Patient name: Tyler [name]) DOB: (06/27/1928) 1. Are you/is patient experiencing any problems since coming home? (NO) a. Are there any questions regarding any aspect of care? (NA) 2. Are there any questions regarding medications administration/dosing? (NO) a. Are meds being taken as prescribed? (YES) b. "Patient should review meds with caller to confirm"  3. Have there been any falls? (NO) 4. Has Home Health been to the house and/or have they contacted you? (NO) a. If not, have you tried to contact them? (NO) b. Can we help you contact them? (YES, ADVISED PATIENTS WIFE TO CONTACT us IF THEY DO NOT HEAR FROM HOME HEALTH AGENCY BY 06-30-2018 - 100PM AND WE WILL CONTACT THEM TO FIND OUT WHAT IS CAUSING A DELAY) 5. Are bowels and bladder emptying properly? (YES) a. Are there any unexpected incontinence issues? (NA) b. If applicable, is patient following bowel/bladder programs? (NA) 6. Any fevers, problems with breathing, unexpected pain? (N) 7. Are there any skin problems or new areas of breakdown? (NO) 8. Has the patient/family member arranged specialty MD follow up (ie cardiology/neurology/renal/surgical/etc.)?  (YES) a. Can we help arrange? (NA) 9. Does the patient need any other services or support that we can help arrange? (NO) 10. Are caregivers following through as expected in assisting the patient? (YES) 11. Has the patient quit smoking, drinking alcohol, or using drugs as recommended? (NA)  Appointment date/time (07-11-2018 / 1120am), arrive time (1100am) and who it is with here (Dr. Posey Pronto) [place]

## 2018-07-05 ENCOUNTER — Telehealth: Payer: Self-pay | Admitting: Physical Medicine & Rehabilitation

## 2018-07-05 ENCOUNTER — Telehealth: Payer: Self-pay | Admitting: *Deleted

## 2018-07-05 NOTE — Telephone Encounter (Signed)
Family is still waiting for [facility] to contact. Was told to call back if they had not contacted

## 2018-07-05 NOTE — Telephone Encounter (Signed)
See CMA message regarding home health - no contact yet with patient.Marland Kitchenlisa

## 2018-07-06 DIAGNOSIS — I10 Essential (primary) hypertension: Secondary | ICD-10-CM | POA: Diagnosis not present

## 2018-07-06 DIAGNOSIS — Z8546 Personal history of malignant neoplasm of prostate: Secondary | ICD-10-CM | POA: Diagnosis not present

## 2018-07-06 DIAGNOSIS — Z7984 Long term (current) use of oral hypoglycemic drugs: Secondary | ICD-10-CM | POA: Diagnosis not present

## 2018-07-06 DIAGNOSIS — Z7902 Long term (current) use of antithrombotics/antiplatelets: Secondary | ICD-10-CM | POA: Diagnosis not present

## 2018-07-06 DIAGNOSIS — E119 Type 2 diabetes mellitus without complications: Secondary | ICD-10-CM | POA: Diagnosis not present

## 2018-07-06 DIAGNOSIS — Z7982 Long term (current) use of aspirin: Secondary | ICD-10-CM | POA: Diagnosis not present

## 2018-07-06 DIAGNOSIS — S32110D Nondisplaced Zone I fracture of sacrum, subsequent encounter for fracture with routine healing: Secondary | ICD-10-CM | POA: Diagnosis not present

## 2018-07-06 DIAGNOSIS — Z8673 Personal history of transient ischemic attack (TIA), and cerebral infarction without residual deficits: Secondary | ICD-10-CM | POA: Diagnosis not present

## 2018-07-06 DIAGNOSIS — F419 Anxiety disorder, unspecified: Secondary | ICD-10-CM | POA: Diagnosis not present

## 2018-07-06 DIAGNOSIS — Z9181 History of falling: Secondary | ICD-10-CM | POA: Diagnosis not present

## 2018-07-06 DIAGNOSIS — S32511D Fracture of superior rim of right pubis, subsequent encounter for fracture with routine healing: Secondary | ICD-10-CM | POA: Diagnosis not present

## 2018-07-07 ENCOUNTER — Telehealth: Payer: Self-pay

## 2018-07-07 NOTE — Telephone Encounter (Signed)
ERROR

## 2018-07-07 NOTE — Telephone Encounter (Signed)
Marzetta Board, PT from [facility] called requesting verbal orders for HHPT 2wk3. Orders approved and given per discharge summary.

## 2018-07-11 ENCOUNTER — Encounter: Payer: PPO | Attending: Registered Nurse | Admitting: Registered Nurse

## 2018-07-11 ENCOUNTER — Encounter: Payer: Self-pay | Admitting: Registered Nurse

## 2018-07-11 VITALS — BP 153/72 | HR 79 | Ht 71.0 in | Wt 164.0 lb

## 2018-07-11 DIAGNOSIS — E119 Type 2 diabetes mellitus without complications: Secondary | ICD-10-CM

## 2018-07-11 DIAGNOSIS — I10 Essential (primary) hypertension: Secondary | ICD-10-CM | POA: Insufficient documentation

## 2018-07-11 DIAGNOSIS — S32591A Other specified fracture of right pubis, initial encounter for closed fracture: Secondary | ICD-10-CM | POA: Diagnosis not present

## 2018-07-11 NOTE — Patient Instructions (Addendum)
Call Dr. Doran Durand Office for Hospital Follow up Appointment.   Call [name], MD Specialty: Orthopedic Surgery for follow up appointent [facility] [address] [place] [address] [phone]

## 2018-07-11 NOTE — Progress Notes (Signed)
Subjective:    Patient ID: Tyler [name], male    DOB: October 28, 1928, 85 y.o.   MRN: [id number]  HPI: Tyler [name] is a 85 y.o. male who is here for Transitional Care Visit.  He presented to [facility]  on 06/12/2018 post fall at church.  DG Hip Unilateral W or WO Pelvis 2-3 Views Right:  IMPRESSION: No evidence for acute displaced fracture. Well corticated ossific density adjacent to the lesser trochanter, potentially sequelae of prior trauma. Recommend correlation for point tenderness to exclude the possibility of avulsion injury.  CT HIP Right WO Contrast:  IMPRESSION: 1. Acute nondisplaced fractures of the right sacral ala, right superior pubic ramus, and right inferior pubic ramus. 2. Degenerative loss of intervertebral disc height at L4-5. 3.  Aortic Atherosclerosis (ICD10-I70.0).  CT Head WO Contrast  IMPRESSION: Mild diffuse cortical atrophy. Mild chronic ischemic white matter disease. No acute intracranial abnormality seen. Dr. Doran Durand was consulted for input he recommended WBAT.  He was admitted to Inpatient Rehabilitation on 06/15/2018 and  discharged home  on 06/27/2018. He's receiving outpatient therapy with [facility].  He reports his pain in his lower back and right hip. He rates his pain 3.  Also reports he has a good appetite.  Wife in room all questions answered.   Pain Inventory Average Pain 4 Pain Right Now 3 My pain is aching  In the last 24 hours, has pain interfered with the following? General activity 6 Relation with others 0 Enjoyment of life 2 What TIME of day is your pain at its worst? daytime Sleep (in general) Fair  Pain is worse with: walking Pain improves with: heat/ice and medication Relief from Meds: 7  Mobility walk with assistance use a walker ability to climb steps?  yes  Function retired I need assistance with the following:  dressing, bathing, meal prep, household duties and  shopping  Neuro/Psych anxiety  Prior Studies Any changes since last visit?  no  Physicians involved in your care Any changes since last visit?  no   Family History  Problem Relation Age of Onset  . Emphysema Father   . Heart Problems Mother    Social History   Socioeconomic History  . Marital status: Married    Spouse name: Tyler [name]  . Number of children: 2  . Years of education: 7  . Highest education level: Not on file  Occupational History  . Occupation: retired    Fish farm manager: RETIRED    Comment: [employer]  Social Needs  . Financial resource strain: Not on file  . Food insecurity:    Worry: Not on file    Inability: Not on file  . Transportation needs:    Medical: Not on file    Non-medical: Not on file  Tobacco Use  . Smoking status: Former Smoker    Last attempt to quit: 05/04/1968    Years since quitting: 50.2  . Smokeless tobacco: Never Used  Substance and Sexual Activity  . Alcohol use: No  . Drug use: No  . Sexual activity: Not on file  Lifestyle  . Physical activity:    Days per week: Not on file    Minutes per session: Not on file  . Stress: Not on file  Relationships  . Social connections:    Talks on phone: Not on file    Gets together: Not on file    Attends religious service: Not on file    Active member of club or  organization: Not on file    Attends meetings of clubs or organizations: Not on file    Relationship status: Not on file  Other Topics Concern  . Not on file  Social History Narrative   Patient lives at home with his wife Tyler [name]) retired. Patient has a high school education. Two Children . No caffeine. Right handed.   Past Surgical History:  Procedure Laterality Date  . CARDIAC CATHETERIZATION    . FEMUR IM NAIL Right 11/17/2015   Procedure: INTRAMEDULLARY (IM) NAIL FEMORAL;  Surgeon: Marybelle Killings, MD;  Location: [facility];  Service: Orthopedics;  Laterality: Right;  . PROSTATE SURGERY     Past Medical History:  Diagnosis Date   . Cancer [facility])    Prostate  . Diabetes mellitus without complication ([place])   . Hypertension   . Kidney stones   . Stroke ([place])    BP (!) 153/72   Pulse 79   Ht 5\' 11"  (1.803 m)   Wt 164 lb (74.4 kg)   SpO2 98%   BMI 22.87 kg/m   Opioid Risk Score:   Fall Risk Score:  `1  Depression screen PHQ 2/9  No flowsheet data found.   Review of Systems  Constitutional: Positive for appetite change and unexpected weight change.  HENT: Negative.   Eyes: Negative.   Respiratory: Negative.   Cardiovascular: Negative.   Gastrointestinal: Negative.   Endocrine: Negative.   Genitourinary: Negative.   Musculoskeletal: Positive for arthralgias and myalgias.  Skin: Negative.   Allergic/Immunologic: Negative.   Neurological: Negative.   Hematological: Negative.   Psychiatric/Behavioral: The patient is nervous/anxious.   All other systems reviewed and are negative.      Objective:   Physical Exam Vitals signs and nursing note reviewed.  Constitutional:      Appearance: Normal appearance.  Neck:     Musculoskeletal: Normal range of motion and neck supple.  Cardiovascular:     Rate and Rhythm: Normal rate and regular rhythm.     Pulses: Normal pulses.     Heart sounds: Normal heart sounds.  Pulmonary:     Effort: Pulmonary effort is normal.     Breath sounds: Normal breath sounds.  Musculoskeletal:     Comments: Normal Muscle Bulk and Muscle Testing Reveals:  Upper Extremities: Full ROM and Muscle Strength 5/5 Back without spinal tenderness  Lower Extremities: Right: Decreased ROM and Muscle Strength 4/5  Right Lower Extremity Flexion Produces Pain into hip Left: Full ROM and Muscle Strength 5/5 Arises from Table slowly using walker for support  Narrow Based Gait   Skin:    General: Skin is warm and dry.  Neurological:     Mental Status: He is alert and oriented to person, place, and time.  Psychiatric:        Mood and Affect: Mood normal.        Behavior: Behavior  normal.           Assessment & Plan:  1. Closed Fracture of pubic ramus, right: Continue out patient rehabilitation. Encouraged to schedule appointment with Orthopedics. They verbalizes understanding.  2. Hypertension: Continue current medication regimen. PCP following.  3. Type 2 DM in nonobese:  Continue current medication regimen. PCP following.   30 minutes of face to face patient care time was spent during this visit. All questions were encouraged and answered.  F/U in 4-6 weeks with Dr. Posey Pronto

## 2018-07-12 ENCOUNTER — Telehealth: Payer: Self-pay | Admitting: *Deleted

## 2018-07-12 NOTE — Telephone Encounter (Signed)
Esther OT with [facility] called to report Mrs Egger declined the OT evaluation.

## 2018-07-15 DIAGNOSIS — Z8673 Personal history of transient ischemic attack (TIA), and cerebral infarction without residual deficits: Secondary | ICD-10-CM

## 2018-07-15 DIAGNOSIS — Z8546 Personal history of malignant neoplasm of prostate: Secondary | ICD-10-CM

## 2018-07-15 DIAGNOSIS — I10 Essential (primary) hypertension: Secondary | ICD-10-CM | POA: Diagnosis not present

## 2018-07-15 DIAGNOSIS — Z7984 Long term (current) use of oral hypoglycemic drugs: Secondary | ICD-10-CM

## 2018-07-15 DIAGNOSIS — S32110D Nondisplaced Zone I fracture of sacrum, subsequent encounter for fracture with routine healing: Secondary | ICD-10-CM | POA: Diagnosis not present

## 2018-07-15 DIAGNOSIS — S32511D Fracture of superior rim of right pubis, subsequent encounter for fracture with routine healing: Secondary | ICD-10-CM | POA: Diagnosis not present

## 2018-07-15 DIAGNOSIS — Z9181 History of falling: Secondary | ICD-10-CM | POA: Diagnosis not present

## 2018-07-15 DIAGNOSIS — E119 Type 2 diabetes mellitus without complications: Secondary | ICD-10-CM | POA: Diagnosis not present

## 2018-07-15 DIAGNOSIS — Z7982 Long term (current) use of aspirin: Secondary | ICD-10-CM

## 2018-07-15 DIAGNOSIS — Z7902 Long term (current) use of antithrombotics/antiplatelets: Secondary | ICD-10-CM | POA: Diagnosis not present

## 2018-07-15 DIAGNOSIS — F419 Anxiety disorder, unspecified: Secondary | ICD-10-CM | POA: Diagnosis not present

## 2018-07-21 ENCOUNTER — Telehealth: Payer: Self-pay | Admitting: *Deleted

## 2018-07-21 NOTE — Telephone Encounter (Signed)
Lenell Antu, HHPT, [facility] asked for verbal orders to extend [place] visits 2week2 followed by [facility]. Medical record reviewed. Social work note reviewed.  Verbal orders given per office protocol.

## 2018-08-22 ENCOUNTER — Other Ambulatory Visit: Payer: Self-pay

## 2018-08-22 ENCOUNTER — Encounter: Payer: Self-pay | Admitting: Physical Medicine & Rehabilitation

## 2018-08-22 ENCOUNTER — Encounter: Payer: PPO | Attending: Registered Nurse | Admitting: Physical Medicine & Rehabilitation

## 2018-08-22 DIAGNOSIS — I10 Essential (primary) hypertension: Secondary | ICD-10-CM | POA: Diagnosis not present

## 2018-08-22 DIAGNOSIS — E119 Type 2 diabetes mellitus without complications: Secondary | ICD-10-CM | POA: Insufficient documentation

## 2018-08-22 DIAGNOSIS — G8918 Other acute postprocedural pain: Secondary | ICD-10-CM | POA: Diagnosis not present

## 2018-08-22 DIAGNOSIS — S329XXS Fracture of unspecified parts of lumbosacral spine and pelvis, sequela: Secondary | ICD-10-CM | POA: Diagnosis not present

## 2018-08-22 DIAGNOSIS — S32591A Other specified fracture of right pubis, initial encounter for closed fracture: Secondary | ICD-10-CM | POA: Insufficient documentation

## 2018-08-22 DIAGNOSIS — S329XXA Fracture of unspecified parts of lumbosacral spine and pelvis, initial encounter for closed fracture: Secondary | ICD-10-CM | POA: Insufficient documentation

## 2018-08-22 DIAGNOSIS — F411 Generalized anxiety disorder: Secondary | ICD-10-CM

## 2018-08-22 NOTE — Progress Notes (Signed)
Subjective:    Patient ID: Tyler [name], male    DOB: February 22, 1929, 85 y.o.   MRN: [id number]  TELEHEALTH NOTE  Due to national recommendations of social distancing due to COVID 19, an audio/video telehealth visit is felt to be most appropriate for this patient at this time.  See Chart message from today for the patient's consent to telehealth from [place].     I verified that I am speaking with the correct person using two identifiers.  Location of patient: Home Location of provider: Office Method of communication: Telephone Names of participants : Zorita Pang scheduling, Wenda Overland obtaining consent and vitals if available Established patient Time spent on call: 14 minutes  HPI 85 year old male with history T2DM, prostate cancer, HTN, CVA/TIA; who was admitted on 06/12/2018 after a fall with onset of right hip and groin pain presents for follow up for pelvic fracture.   Last clinic visit 07/11/2018 with NP, notes reviewed. Wife supplements history. Since that time, he has been checking his CBGs, which have been controlled. He has been doing HEP after completing therapies.  He sees Ortho next month. BP has been controlled. He has followed up with PCP. Pain has been controlled. Discussed Tylenol PRN. Denies fall.  DME: Toilet seat Mobility: Walker at all times.   Pain Inventory Average Pain 6 Pain Right Now 0 My pain is intermittent and aching  In the last 24 hours, has pain interfered with the following? General activity 5 Relation with others 0 Enjoyment of life 2 What TIME of day is your pain at its worst? daytime Sleep (in general) Fair  Pain is worse with: walking Pain improves with: medication Relief from Meds: 7  Mobility walk with assistance use a walker ability to climb steps?  yes do you drive?  no  Function retired I need assistance with the following:  bathing, meal prep, household duties and shopping   Neuro/Psych bowel control problems weakness trouble walking depression  Prior Studies Any changes since last visit?  no   Physicians involved in your care Any changes since last visit?  no   Family History  Problem Relation Age of Onset  . Emphysema Father   . Heart Problems Mother    Social History   Socioeconomic History  . Marital status: Married    Spouse name: Marland Mcalpine  . Number of children: 2  . Years of education: 56  . Highest education level: Not on file  Occupational History  . Occupation: retired    Fish farm manager: RETIRED    Comment: [employer]  Social Needs  . Financial resource strain: Not on file  . Food insecurity:    Worry: Not on file    Inability: Not on file  . Transportation needs:    Medical: Not on file    Non-medical: Not on file  Tobacco Use  . Smoking status: Former Smoker    Last attempt to quit: 05/04/1968    Years since quitting: 50.3  . Smokeless tobacco: Never Used  Substance and Sexual Activity  . Alcohol use: No  . Drug use: No  . Sexual activity: Not on file  Lifestyle  . Physical activity:    Days per week: Not on file    Minutes per session: Not on file  . Stress: Not on file  Relationships  . Social connections:    Talks on phone: Not on file    Gets together: Not on file  Attends religious service: Not on file    Active member of club or organization: Not on file    Attends meetings of clubs or organizations: Not on file    Relationship status: Not on file  Other Topics Concern  . Not on file  Social History Narrative   Patient lives at home with his wife [name]) retired. Patient has a high school education. Two Children . No caffeine. Right handed.   Past Surgical History:  Procedure Laterality Date  . CARDIAC CATHETERIZATION    . FEMUR IM NAIL Right 11/17/2015   Procedure: INTRAMEDULLARY (IM) NAIL FEMORAL;  Surgeon: Marybelle Killings, MD;  Location: [place];  Service: Orthopedics;  Laterality: Right;  . PROSTATE SURGERY      Past Medical History:  Diagnosis Date  . Cancer [facility])    Prostate  . Diabetes mellitus without complication ([place])   . Hypertension   . Kidney stones   . Stroke [facility])    There were no vitals taken for this visit.  Opioid Risk Score:   Fall Risk Score:  `1  Depression screen PHQ 2/9  Depression screen PHQ 2/9 08/22/2018  Decreased Interest 3  Down, Depressed, Hopeless 3  PHQ - 2 Score 6    Review of Systems  Constitutional: Negative.   HENT: Negative.   Eyes: Negative.   Respiratory: Negative.   Cardiovascular: Negative.   Gastrointestinal: Positive for constipation.  Endocrine: Negative.   Genitourinary: Negative.   Musculoskeletal: Positive for gait problem.  Skin: Negative.   Allergic/Immunologic: Negative.   Neurological: Positive for weakness.  Hematological: Bruises/bleeds easily.       Plavix  Psychiatric/Behavioral: Positive for dysphoric mood.  All other systems reviewed and are negative.      Objective:   Physical Exam Gen: NAD. Pulm: Effort normal Neuro: Alert and oriented    Assessment & Plan:  85 year old male with history T2DM, prostate cancer, HTN, CVA/TIA; who was admitted on 06/12/2018 after a fall with onset of right hip and groin pain presents for follow up for pelvic fracture.   1.  Functional decline secondary to Pelvic fracture and pain.             Continue HEP  Follow up with Ortho next month  2. Pain Management:   Cont tylenol prn  3. Anxiety disorder with panic attack:   Cont meds per PCP  4. T2DM:   Cont meds  Controlled per pt  5. HTN:   Cont meds  Controlled per pt

## 2018-09-13 DIAGNOSIS — S32591D Other specified fracture of right pubis, subsequent encounter for fracture with routine healing: Secondary | ICD-10-CM | POA: Diagnosis not present

## 2018-09-13 DIAGNOSIS — S32511D Fracture of superior rim of right pubis, subsequent encounter for fracture with routine healing: Secondary | ICD-10-CM | POA: Diagnosis not present

## 2018-09-13 DIAGNOSIS — M25551 Pain in right hip: Secondary | ICD-10-CM | POA: Diagnosis not present

## 2018-09-13 DIAGNOSIS — M545 Low back pain: Secondary | ICD-10-CM | POA: Diagnosis not present

## 2018-10-06 DIAGNOSIS — M545 Low back pain, unspecified: Secondary | ICD-10-CM | POA: Insufficient documentation

## 2018-10-06 DIAGNOSIS — M25559 Pain in unspecified hip: Secondary | ICD-10-CM | POA: Insufficient documentation

## 2018-10-07 ENCOUNTER — Other Ambulatory Visit (HOSPITAL_COMMUNITY): Payer: Self-pay | Admitting: Orthopedic Surgery

## 2018-10-07 ENCOUNTER — Other Ambulatory Visit: Payer: Self-pay | Admitting: Orthopedic Surgery

## 2018-10-07 DIAGNOSIS — M545 Low back pain: Secondary | ICD-10-CM | POA: Diagnosis not present

## 2018-10-07 DIAGNOSIS — M25552 Pain in left hip: Secondary | ICD-10-CM | POA: Diagnosis not present

## 2018-10-09 ENCOUNTER — Ambulatory Visit (HOSPITAL_COMMUNITY)
Admission: RE | Admit: 2018-10-09 | Discharge: 2018-10-09 | Disposition: A | Discharge status: Home / Self Care | Payer: PPO | Source: Ambulatory Visit | Attending: Orthopedic Surgery | Admitting: Orthopedic Surgery

## 2018-10-09 ENCOUNTER — Other Ambulatory Visit: Payer: Self-pay

## 2018-10-09 DIAGNOSIS — T402X5A Adverse effect of other opioids, initial encounter: Secondary | ICD-10-CM | POA: Diagnosis not present

## 2018-10-09 DIAGNOSIS — I69392 Facial weakness following cerebral infarction: Secondary | ICD-10-CM | POA: Diagnosis not present

## 2018-10-09 DIAGNOSIS — S72002D Fracture of unspecified part of neck of left femur, subsequent encounter for closed fracture with routine healing: Secondary | ICD-10-CM | POA: Diagnosis not present

## 2018-10-09 DIAGNOSIS — S72045A Nondisplaced fracture of base of neck of left femur, initial encounter for closed fracture: Secondary | ICD-10-CM | POA: Diagnosis not present

## 2018-10-09 DIAGNOSIS — I6789 Other cerebrovascular disease: Secondary | ICD-10-CM | POA: Diagnosis not present

## 2018-10-09 DIAGNOSIS — G459 Transient cerebral ischemic attack, unspecified: Secondary | ICD-10-CM | POA: Diagnosis not present

## 2018-10-09 DIAGNOSIS — F331 Major depressive disorder, recurrent, moderate: Secondary | ICD-10-CM | POA: Diagnosis not present

## 2018-10-09 DIAGNOSIS — G92 Toxic encephalopathy: Secondary | ICD-10-CM | POA: Diagnosis not present

## 2018-10-09 DIAGNOSIS — R29703 NIHSS score 3: Secondary | ICD-10-CM | POA: Diagnosis not present

## 2018-10-09 DIAGNOSIS — M25552 Pain in left hip: Secondary | ICD-10-CM | POA: Insufficient documentation

## 2018-10-09 DIAGNOSIS — K5900 Constipation, unspecified: Secondary | ICD-10-CM | POA: Diagnosis present

## 2018-10-09 DIAGNOSIS — F419 Anxiety disorder, unspecified: Secondary | ICD-10-CM | POA: Diagnosis not present

## 2018-10-09 DIAGNOSIS — R2689 Other abnormalities of gait and mobility: Secondary | ICD-10-CM | POA: Diagnosis not present

## 2018-10-09 DIAGNOSIS — S72002A Fracture of unspecified part of neck of left femur, initial encounter for closed fracture: Secondary | ICD-10-CM | POA: Diagnosis present

## 2018-10-09 DIAGNOSIS — Z882 Allergy status to sulfonamides status: Secondary | ICD-10-CM | POA: Diagnosis not present

## 2018-10-09 DIAGNOSIS — E785 Hyperlipidemia, unspecified: Secondary | ICD-10-CM | POA: Diagnosis present

## 2018-10-09 DIAGNOSIS — R457 State of emotional shock and stress, unspecified: Secondary | ICD-10-CM | POA: Diagnosis not present

## 2018-10-09 DIAGNOSIS — Z7902 Long term (current) use of antithrombotics/antiplatelets: Secondary | ICD-10-CM | POA: Diagnosis not present

## 2018-10-09 DIAGNOSIS — Z87891 Personal history of nicotine dependence: Secondary | ICD-10-CM | POA: Diagnosis not present

## 2018-10-09 DIAGNOSIS — S3219XD Other fracture of sacrum, subsequent encounter for fracture with routine healing: Secondary | ICD-10-CM | POA: Diagnosis not present

## 2018-10-09 DIAGNOSIS — R079 Chest pain, unspecified: Secondary | ICD-10-CM | POA: Diagnosis not present

## 2018-10-09 DIAGNOSIS — M2550 Pain in unspecified joint: Secondary | ICD-10-CM | POA: Diagnosis not present

## 2018-10-09 DIAGNOSIS — E1122 Type 2 diabetes mellitus with diabetic chronic kidney disease: Secondary | ICD-10-CM | POA: Diagnosis present

## 2018-10-09 DIAGNOSIS — I69351 Hemiplegia and hemiparesis following cerebral infarction affecting right dominant side: Secondary | ICD-10-CM | POA: Diagnosis not present

## 2018-10-09 DIAGNOSIS — N183 Chronic kidney disease, stage 3 (moderate): Secondary | ICD-10-CM | POA: Diagnosis present

## 2018-10-09 DIAGNOSIS — S7292XD Unspecified fracture of left femur, subsequent encounter for closed fracture with routine healing: Secondary | ICD-10-CM | POA: Diagnosis not present

## 2018-10-09 DIAGNOSIS — Z7401 Bed confinement status: Secondary | ICD-10-CM | POA: Diagnosis not present

## 2018-10-09 DIAGNOSIS — Z1159 Encounter for screening for other viral diseases: Secondary | ICD-10-CM | POA: Diagnosis not present

## 2018-10-09 DIAGNOSIS — S72092A Other fracture of head and neck of left femur, initial encounter for closed fracture: Secondary | ICD-10-CM | POA: Diagnosis not present

## 2018-10-09 DIAGNOSIS — I129 Hypertensive chronic kidney disease with stage 1 through stage 4 chronic kidney disease, or unspecified chronic kidney disease: Secondary | ICD-10-CM | POA: Diagnosis present

## 2018-10-09 DIAGNOSIS — S3289XA Fracture of other parts of pelvis, initial encounter for closed fracture: Secondary | ICD-10-CM | POA: Diagnosis not present

## 2018-10-09 DIAGNOSIS — T481X5A Adverse effect of skeletal muscle relaxants [neuromuscular blocking agents], initial encounter: Secondary | ICD-10-CM | POA: Diagnosis not present

## 2018-10-09 DIAGNOSIS — Z79890 Hormone replacement therapy (postmenopausal): Secondary | ICD-10-CM | POA: Diagnosis not present

## 2018-10-09 DIAGNOSIS — Z79899 Other long term (current) drug therapy: Secondary | ICD-10-CM | POA: Diagnosis not present

## 2018-10-09 DIAGNOSIS — Z7984 Long term (current) use of oral hypoglycemic drugs: Secondary | ICD-10-CM | POA: Diagnosis not present

## 2018-10-09 DIAGNOSIS — K219 Gastro-esophageal reflux disease without esophagitis: Secondary | ICD-10-CM | POA: Diagnosis not present

## 2018-10-09 DIAGNOSIS — R4701 Aphasia: Secondary | ICD-10-CM | POA: Diagnosis not present

## 2018-10-09 DIAGNOSIS — E1129 Type 2 diabetes mellitus with other diabetic kidney complication: Secondary | ICD-10-CM | POA: Diagnosis not present

## 2018-10-09 DIAGNOSIS — Z79891 Long term (current) use of opiate analgesic: Secondary | ICD-10-CM | POA: Diagnosis not present

## 2018-10-09 DIAGNOSIS — W010XXA Fall on same level from slipping, tripping and stumbling without subsequent striking against object, initial encounter: Secondary | ICD-10-CM | POA: Diagnosis present

## 2018-10-09 DIAGNOSIS — I10 Essential (primary) hypertension: Secondary | ICD-10-CM | POA: Diagnosis not present

## 2018-10-09 DIAGNOSIS — Y92230 Patient room in hospital as the place of occurrence of the external cause: Secondary | ICD-10-CM | POA: Diagnosis not present

## 2018-10-09 DIAGNOSIS — R41841 Cognitive communication deficit: Secondary | ICD-10-CM | POA: Diagnosis not present

## 2018-10-09 DIAGNOSIS — R278 Other lack of coordination: Secondary | ICD-10-CM | POA: Diagnosis not present

## 2018-10-09 DIAGNOSIS — Z8249 Family history of ischemic heart disease and other diseases of the circulatory system: Secondary | ICD-10-CM | POA: Diagnosis not present

## 2018-10-09 DIAGNOSIS — R1312 Dysphagia, oropharyngeal phase: Secondary | ICD-10-CM | POA: Diagnosis not present

## 2018-10-11 ENCOUNTER — Other Ambulatory Visit: Payer: Self-pay

## 2018-10-11 ENCOUNTER — Encounter (HOSPITAL_COMMUNITY): Payer: Self-pay | Admitting: *Deleted

## 2018-10-11 NOTE — Progress Notes (Signed)
SPOKE W/  Patients son     SCREENING SYMPTOMS OF COVID 19:   COUGH-- no  RUNNY NOSE--- no  SORE THROAT--- no  NASAL CONGESTION---- no  SNEEZING---- no  SHORTNESS OF BREATH--- no  DIFFICULTY BREATHING--- no  TEMP >100.0 ----- no  UNEXPLAINED BODY ACHES------ no  CHILLS --------  no  HEADACHES --------- no  LOSS OF SMELL/ TASTE -------- no    HAVE YOU OR ANY FAMILY MEMBER TRAVELLED PAST 14 DAYS OUT OF THE   COUNTY--- no STATE---- no COUNTRY---- no  HAVE YOU OR ANY FAMILY MEMBER BEEN EXPOSED TO ANYONE WITH COVID 19?   No

## 2018-10-11 NOTE — Progress Notes (Signed)
Spoke with son -Romone Shaff via phone about patient's medical history and gave him instructions for surgery. Instructed Haynes Kerns, son to call patient's PCP and get instructions about Plavix as the son stated that they were not given any instructions about his Plavix medication. The son verbalized understanding. Patient will arrive tomorrow 10/12/2018 at 315 pm to get Covid 19 swab done.

## 2018-10-11 NOTE — Progress Notes (Signed)
06/20/2018- noted in Epic- EKG  06/29/2016- noted in Epic-ECHO

## 2018-10-12 ENCOUNTER — Encounter (HOSPITAL_COMMUNITY)
Admission: RE | Disposition: A | Discharge status: Skilled Nursing Facility | Payer: Self-pay | Source: Home / Self Care | Attending: Orthopedic Surgery

## 2018-10-12 ENCOUNTER — Inpatient Hospital Stay (HOSPITAL_COMMUNITY): Payer: PPO

## 2018-10-12 ENCOUNTER — Inpatient Hospital Stay (HOSPITAL_COMMUNITY)
Admission: RE | Admit: 2018-10-12 | Discharge: 2018-10-19 | DRG: 480 | Disposition: A | Discharge status: Skilled Nursing Facility | Payer: PPO | Attending: Orthopedic Surgery | Admitting: Orthopedic Surgery

## 2018-10-12 ENCOUNTER — Inpatient Hospital Stay (HOSPITAL_COMMUNITY): Payer: PPO | Admitting: Physician Assistant

## 2018-10-12 ENCOUNTER — Telehealth (HOSPITAL_COMMUNITY): Payer: Self-pay | Admitting: *Deleted

## 2018-10-12 ENCOUNTER — Encounter (HOSPITAL_COMMUNITY): Payer: Self-pay | Admitting: General Practice

## 2018-10-12 ENCOUNTER — Other Ambulatory Visit: Payer: Self-pay

## 2018-10-12 ENCOUNTER — Other Ambulatory Visit (HOSPITAL_COMMUNITY): Payer: PPO

## 2018-10-12 ENCOUNTER — Other Ambulatory Visit (HOSPITAL_COMMUNITY): Payer: Self-pay | Admitting: *Deleted

## 2018-10-12 DIAGNOSIS — Z882 Allergy status to sulfonamides status: Secondary | ICD-10-CM

## 2018-10-12 DIAGNOSIS — E1122 Type 2 diabetes mellitus with diabetic chronic kidney disease: Secondary | ICD-10-CM | POA: Diagnosis present

## 2018-10-12 DIAGNOSIS — T481X5A Adverse effect of skeletal muscle relaxants [neuromuscular blocking agents], initial encounter: Secondary | ICD-10-CM | POA: Diagnosis not present

## 2018-10-12 DIAGNOSIS — Z87891 Personal history of nicotine dependence: Secondary | ICD-10-CM

## 2018-10-12 DIAGNOSIS — R29703 NIHSS score 3: Secondary | ICD-10-CM | POA: Diagnosis not present

## 2018-10-12 DIAGNOSIS — W010XXA Fall on same level from slipping, tripping and stumbling without subsequent striking against object, initial encounter: Secondary | ICD-10-CM | POA: Diagnosis present

## 2018-10-12 DIAGNOSIS — T402X5A Adverse effect of other opioids, initial encounter: Secondary | ICD-10-CM | POA: Diagnosis not present

## 2018-10-12 DIAGNOSIS — K5900 Constipation, unspecified: Secondary | ICD-10-CM | POA: Diagnosis present

## 2018-10-12 DIAGNOSIS — S72002A Fracture of unspecified part of neck of left femur, initial encounter for closed fracture: Principal | ICD-10-CM | POA: Diagnosis present

## 2018-10-12 DIAGNOSIS — F419 Anxiety disorder, unspecified: Secondary | ICD-10-CM | POA: Diagnosis not present

## 2018-10-12 DIAGNOSIS — N183 Chronic kidney disease, stage 3 (moderate): Secondary | ICD-10-CM | POA: Diagnosis present

## 2018-10-12 DIAGNOSIS — R4701 Aphasia: Secondary | ICD-10-CM | POA: Diagnosis not present

## 2018-10-12 DIAGNOSIS — E785 Hyperlipidemia, unspecified: Secondary | ICD-10-CM | POA: Diagnosis present

## 2018-10-12 DIAGNOSIS — Z79890 Hormone replacement therapy (postmenopausal): Secondary | ICD-10-CM

## 2018-10-12 DIAGNOSIS — Y92230 Patient room in hospital as the place of occurrence of the external cause: Secondary | ICD-10-CM | POA: Diagnosis not present

## 2018-10-12 DIAGNOSIS — G92 Toxic encephalopathy: Secondary | ICD-10-CM | POA: Diagnosis not present

## 2018-10-12 DIAGNOSIS — S7292XD Unspecified fracture of left femur, subsequent encounter for closed fracture with routine healing: Secondary | ICD-10-CM | POA: Diagnosis not present

## 2018-10-12 DIAGNOSIS — Z7984 Long term (current) use of oral hypoglycemic drugs: Secondary | ICD-10-CM | POA: Diagnosis not present

## 2018-10-12 DIAGNOSIS — I69392 Facial weakness following cerebral infarction: Secondary | ICD-10-CM

## 2018-10-12 DIAGNOSIS — Z8249 Family history of ischemic heart disease and other diseases of the circulatory system: Secondary | ICD-10-CM | POA: Diagnosis not present

## 2018-10-12 DIAGNOSIS — Z7902 Long term (current) use of antithrombotics/antiplatelets: Secondary | ICD-10-CM | POA: Diagnosis not present

## 2018-10-12 DIAGNOSIS — Z1159 Encounter for screening for other viral diseases: Secondary | ICD-10-CM

## 2018-10-12 DIAGNOSIS — S72002D Fracture of unspecified part of neck of left femur, subsequent encounter for closed fracture with routine healing: Secondary | ICD-10-CM | POA: Diagnosis not present

## 2018-10-12 DIAGNOSIS — R079 Chest pain, unspecified: Secondary | ICD-10-CM

## 2018-10-12 DIAGNOSIS — I69351 Hemiplegia and hemiparesis following cerebral infarction affecting right dominant side: Secondary | ICD-10-CM

## 2018-10-12 DIAGNOSIS — Z79891 Long term (current) use of opiate analgesic: Secondary | ICD-10-CM

## 2018-10-12 DIAGNOSIS — I129 Hypertensive chronic kidney disease with stage 1 through stage 4 chronic kidney disease, or unspecified chronic kidney disease: Secondary | ICD-10-CM | POA: Diagnosis present

## 2018-10-12 DIAGNOSIS — Z79899 Other long term (current) drug therapy: Secondary | ICD-10-CM | POA: Diagnosis not present

## 2018-10-12 HISTORY — PX: HIP PINNING,CANNULATED: SHX1758

## 2018-10-12 LAB — CBC
HCT: 38.6 % — ABNORMAL LOW (ref 39.0–52.0)
Hemoglobin: 12.3 g/dL — ABNORMAL LOW (ref 13.0–17.0)
MCH: 31.8 pg (ref 26.0–34.0)
MCHC: 31.9 g/dL (ref 30.0–36.0)
MCV: 99.7 fL (ref 80.0–100.0)
Platelets: 227 10*3/uL (ref 150–400)
RBC: 3.87 MIL/uL — ABNORMAL LOW (ref 4.22–5.81)
RDW: 12.9 % (ref 11.5–15.5)
WBC: 7.4 10*3/uL (ref 4.0–10.5)
nRBC: 0 % (ref 0.0–0.2)

## 2018-10-12 LAB — BASIC METABOLIC PANEL
Anion gap: 9 (ref 5–15)
BUN: 30 mg/dL — ABNORMAL HIGH (ref 8–23)
CO2: 21 mmol/L — ABNORMAL LOW (ref 22–32)
Calcium: 9.7 mg/dL (ref 8.9–10.3)
Chloride: 107 mmol/L (ref 98–111)
Creatinine, Ser: 1.36 mg/dL — ABNORMAL HIGH (ref 0.61–1.24)
GFR calc Af Amer: 54 mL/min — ABNORMAL LOW (ref >60)
GFR calc non Af Amer: 46 mL/min — ABNORMAL LOW (ref >60)
Glucose, Bld: 128 mg/dL — ABNORMAL HIGH (ref 70–99)
Potassium: 4.2 mmol/L (ref 3.5–5.1)
Sodium: 137 mmol/L (ref 135–145)

## 2018-10-12 LAB — GLUCOSE, CAPILLARY
Glucose-Capillary: 140 mg/dL — ABNORMAL HIGH (ref 70–99)
Glucose-Capillary: 177 mg/dL — ABNORMAL HIGH (ref 70–99)

## 2018-10-12 LAB — TYPE AND SCREEN
ABO/RH(D): B POS
Antibody Screen: NEGATIVE

## 2018-10-12 LAB — HEMOGLOBIN A1C
Hgb A1c MFr Bld: 6.5 % — ABNORMAL HIGH (ref 4.8–5.6)
Mean Plasma Glucose: 139.85 mg/dL

## 2018-10-12 LAB — ABO/RH: ABO/RH(D): B POS

## 2018-10-12 LAB — SARS CORONAVIRUS 2 BY RT PCR (HOSPITAL ORDER, PERFORMED IN ~~LOC~~ HOSPITAL LAB): SARS Coronavirus 2: NEGATIVE

## 2018-10-12 SURGERY — FIXATION, FEMUR, NECK, PERCUTANEOUS, USING SCREW
Anesthesia: General | Site: Hip | Laterality: Left

## 2018-10-12 MED ORDER — CEFAZOLIN SODIUM-DEXTROSE 2-4 GM/100ML-% IV SOLN
2.0000 g | INTRAVENOUS | Status: AC
  Administered 2018-10-12: 2 g via INTRAVENOUS
  Filled 2018-10-12: qty 100

## 2018-10-12 MED ORDER — FENTANYL CITRATE (PF) 100 MCG/2ML IJ SOLN
100.0000 ug | Freq: Once | INTRAMUSCULAR | Status: AC
  Administered 2018-10-12: 50 ug via INTRAVENOUS

## 2018-10-12 MED ORDER — FENTANYL CITRATE (PF) 100 MCG/2ML IJ SOLN
INTRAMUSCULAR | Status: AC
  Filled 2018-10-12: qty 2

## 2018-10-12 MED ORDER — PROMETHAZINE HCL 25 MG/ML IJ SOLN: 6.2500 mg | INTRAMUSCULAR | Status: DC | PRN

## 2018-10-12 MED ORDER — CEFAZOLIN SODIUM-DEXTROSE 2-4 GM/100ML-% IV SOLN
2.0000 g | Freq: Four times a day (QID) | INTRAVENOUS | Status: AC
  Administered 2018-10-12 – 2018-10-13 (×2): 2 g via INTRAVENOUS
  Filled 2018-10-12 (×2): qty 100

## 2018-10-12 MED ORDER — TRAMADOL HCL 50 MG PO TABS
50.0000 mg | ORAL_TABLET | Freq: Three times a day (TID) | ORAL | Status: DC | PRN
  Administered 2018-10-13 – 2018-10-17 (×4): 50 mg via ORAL
  Filled 2018-10-12 (×5): qty 1

## 2018-10-12 MED ORDER — LIDOCAINE 2% (20 MG/ML) 5 ML SYRINGE
INTRAMUSCULAR | Status: DC | PRN
  Administered 2018-10-12: 60 mg via INTRAVENOUS

## 2018-10-12 MED ORDER — METOCLOPRAMIDE HCL 5 MG/ML IJ SOLN: 5.0000 mg | Freq: Three times a day (TID) | INTRAMUSCULAR | Status: DC | PRN

## 2018-10-12 MED ORDER — BUPIVACAINE-EPINEPHRINE (PF) 0.25% -1:200000 IJ SOLN
INTRAMUSCULAR | Status: DC | PRN
  Administered 2018-10-12: 20 mL

## 2018-10-12 MED ORDER — POVIDONE-IODINE 10 % EX SWAB
2.0000 "application " | Freq: Once | CUTANEOUS | Status: AC
  Administered 2018-10-12: 2 via TOPICAL

## 2018-10-12 MED ORDER — HYDROMORPHONE HCL 1 MG/ML IJ SOLN
0.2500 mg | INTRAMUSCULAR | Status: DC | PRN
  Administered 2018-10-12: 0.5 mg via INTRAVENOUS
  Administered 2018-10-12: 0.25 mg via INTRAVENOUS

## 2018-10-12 MED ORDER — PANTOPRAZOLE SODIUM 40 MG PO TBEC
40.0000 mg | DELAYED_RELEASE_TABLET | Freq: Every day | ORAL | Status: DC
  Administered 2018-10-13 – 2018-10-19 (×7): 40 mg via ORAL
  Filled 2018-10-12 (×7): qty 1

## 2018-10-12 MED ORDER — ONDANSETRON HCL 4 MG/2ML IJ SOLN
INTRAMUSCULAR | Status: DC | PRN
  Administered 2018-10-12: 4 mg via INTRAVENOUS

## 2018-10-12 MED ORDER — METOCLOPRAMIDE HCL 5 MG PO TABS: 5.0000 mg | ORAL_TABLET | Freq: Three times a day (TID) | ORAL | Status: DC | PRN

## 2018-10-12 MED ORDER — FENTANYL CITRATE (PF) 100 MCG/2ML IJ SOLN
INTRAMUSCULAR | Status: DC | PRN
  Administered 2018-10-12 (×2): 50 ug via INTRAVENOUS

## 2018-10-12 MED ORDER — BUPIVACAINE-EPINEPHRINE (PF) 0.25% -1:200000 IJ SOLN
INTRAMUSCULAR | Status: AC
  Filled 2018-10-12: qty 30

## 2018-10-12 MED ORDER — HYDROMORPHONE HCL 1 MG/ML IJ SOLN
INTRAMUSCULAR | Status: AC
  Filled 2018-10-12: qty 1

## 2018-10-12 MED ORDER — PHENOL 1.4 % MT LIQD: 1.0000 | OROMUCOSAL | Status: DC | PRN

## 2018-10-12 MED ORDER — INSULIN ASPART 100 UNIT/ML ~~LOC~~ SOLN
0.0000 [IU] | Freq: Three times a day (TID) | SUBCUTANEOUS | Status: DC
  Administered 2018-10-13: 5 [IU] via SUBCUTANEOUS
  Administered 2018-10-13 – 2018-10-14 (×4): 2 [IU] via SUBCUTANEOUS
  Administered 2018-10-15: 12:00:00 3 [IU] via SUBCUTANEOUS
  Administered 2018-10-15: 17:00:00 2 [IU] via SUBCUTANEOUS
  Administered 2018-10-16: 16:00:00 3 [IU] via SUBCUTANEOUS
  Administered 2018-10-16: 12:00:00 5 [IU] via SUBCUTANEOUS
  Administered 2018-10-17 – 2018-10-19 (×3): 3 [IU] via SUBCUTANEOUS
  Administered 2018-10-19: 2 [IU] via SUBCUTANEOUS

## 2018-10-12 MED ORDER — LACTATED RINGERS IV SOLN
INTRAVENOUS | Status: DC
  Administered 2018-10-12: 16:00:00 via INTRAVENOUS

## 2018-10-12 MED ORDER — SERTRALINE HCL 100 MG PO TABS
100.0000 mg | ORAL_TABLET | Freq: Every day | ORAL | Status: DC
  Administered 2018-10-13 – 2018-10-19 (×7): 100 mg via ORAL
  Filled 2018-10-12 (×7): qty 1

## 2018-10-12 MED ORDER — PROPOFOL 10 MG/ML IV BOLUS
INTRAVENOUS | Status: DC | PRN
  Administered 2018-10-12: 70 mg via INTRAVENOUS

## 2018-10-12 MED ORDER — LOSARTAN POTASSIUM 50 MG PO TABS
50.0000 mg | ORAL_TABLET | Freq: Every evening | ORAL | Status: DC
  Administered 2018-10-13 – 2018-10-18 (×6): 50 mg via ORAL
  Filled 2018-10-12 (×6): qty 1

## 2018-10-12 MED ORDER — ONDANSETRON HCL 4 MG/2ML IJ SOLN
4.0000 mg | Freq: Four times a day (QID) | INTRAMUSCULAR | Status: DC | PRN
  Administered 2018-10-13: 4 mg via INTRAVENOUS
  Filled 2018-10-12: qty 2

## 2018-10-12 MED ORDER — ROCURONIUM BROMIDE 10 MG/ML (PF) SYRINGE
PREFILLED_SYRINGE | INTRAVENOUS | Status: DC | PRN
  Administered 2018-10-12: 30 mg via INTRAVENOUS

## 2018-10-12 MED ORDER — DOCUSATE SODIUM 100 MG PO CAPS
100.0000 mg | ORAL_CAPSULE | Freq: Two times a day (BID) | ORAL | Status: DC
  Administered 2018-10-12 – 2018-10-19 (×14): 100 mg via ORAL
  Filled 2018-10-12 (×14): qty 1

## 2018-10-12 MED ORDER — CLOPIDOGREL BISULFATE 75 MG PO TABS
75.0000 mg | ORAL_TABLET | Freq: Every day | ORAL | Status: DC
  Administered 2018-10-13 – 2018-10-19 (×7): 75 mg via ORAL
  Filled 2018-10-12 (×7): qty 1

## 2018-10-12 MED ORDER — ATORVASTATIN CALCIUM 20 MG PO TABS
20.0000 mg | ORAL_TABLET | Freq: Every day | ORAL | Status: DC
  Administered 2018-10-12 – 2018-10-18 (×7): 20 mg via ORAL
  Filled 2018-10-12 (×8): qty 1

## 2018-10-12 MED ORDER — MENTHOL 3 MG MT LOZG: 1.0000 | LOZENGE | OROMUCOSAL | Status: DC | PRN

## 2018-10-12 MED ORDER — SUCCINYLCHOLINE CHLORIDE 20 MG/ML IJ SOLN
INTRAMUSCULAR | Status: DC | PRN
  Administered 2018-10-12: 100 mg via INTRAVENOUS

## 2018-10-12 MED ORDER — ASPIRIN EC 325 MG PO TBEC
325.0000 mg | DELAYED_RELEASE_TABLET | Freq: Every day | ORAL | Status: DC
  Administered 2018-10-13 – 2018-10-19 (×7): 325 mg via ORAL
  Filled 2018-10-12 (×7): qty 1

## 2018-10-12 MED ORDER — PROPOFOL 10 MG/ML IV BOLUS
INTRAVENOUS | Status: AC
  Filled 2018-10-12: qty 60

## 2018-10-12 MED ORDER — METHOCARBAMOL 500 MG IVPB - SIMPLE MED
500.0000 mg | Freq: Four times a day (QID) | INTRAVENOUS | Status: DC | PRN
  Administered 2018-10-12: 19:00:00 500 mg via INTRAVENOUS
  Filled 2018-10-12: qty 50

## 2018-10-12 MED ORDER — CHLORHEXIDINE GLUCONATE 4 % EX LIQD: 60.0000 mL | Freq: Once | CUTANEOUS | Status: DC

## 2018-10-12 MED ORDER — MORPHINE SULFATE (PF) 2 MG/ML IV SOLN
1.0000 mg | INTRAVENOUS | Status: DC | PRN
  Administered 2018-10-13: 1 mg via INTRAVENOUS
  Filled 2018-10-12: qty 1

## 2018-10-12 MED ORDER — SODIUM CHLORIDE 0.9 % IV SOLN
INTRAVENOUS | Status: DC
  Administered 2018-10-12: 1000 mL via INTRAVENOUS

## 2018-10-12 MED ORDER — GLIMEPIRIDE 1 MG PO TABS
1.0000 mg | ORAL_TABLET | Freq: Every day | ORAL | Status: DC
  Administered 2018-10-13 – 2018-10-19 (×7): 1 mg via ORAL
  Filled 2018-10-12 (×7): qty 1

## 2018-10-12 MED ORDER — ACETAMINOPHEN 10 MG/ML IV SOLN
1000.0000 mg | Freq: Four times a day (QID) | INTRAVENOUS | Status: DC
  Administered 2018-10-12: 1000 mg via INTRAVENOUS
  Filled 2018-10-12: qty 100

## 2018-10-12 MED ORDER — AMLODIPINE BESYLATE 5 MG PO TABS
2.5000 mg | ORAL_TABLET | Freq: Every day | ORAL | Status: DC
  Administered 2018-10-12 – 2018-10-18 (×7): 2.5 mg via ORAL
  Filled 2018-10-12 (×7): qty 1

## 2018-10-12 MED ORDER — METHOCARBAMOL 500 MG IVPB - SIMPLE MED
INTRAVENOUS | Status: AC
  Filled 2018-10-12: qty 50

## 2018-10-12 MED ORDER — HYDROCODONE-ACETAMINOPHEN 5-325 MG PO TABS
1.0000 | ORAL_TABLET | Freq: Four times a day (QID) | ORAL | Status: DC | PRN
  Administered 2018-10-12 – 2018-10-18 (×10): 1 via ORAL
  Filled 2018-10-12 (×10): qty 1

## 2018-10-12 MED ORDER — DEXAMETHASONE SODIUM PHOSPHATE 10 MG/ML IJ SOLN
INTRAMUSCULAR | Status: DC | PRN
  Administered 2018-10-12: 10 mg via INTRAVENOUS

## 2018-10-12 MED ORDER — 0.9 % SODIUM CHLORIDE (POUR BTL) OPTIME
TOPICAL | Status: DC | PRN
  Administered 2018-10-12: 18:00:00 1000 mL

## 2018-10-12 MED ORDER — METHOCARBAMOL 500 MG PO TABS
500.0000 mg | ORAL_TABLET | Freq: Four times a day (QID) | ORAL | Status: DC | PRN
  Administered 2018-10-13 – 2018-10-17 (×6): 500 mg via ORAL
  Filled 2018-10-12 (×6): qty 1

## 2018-10-12 MED ORDER — SUGAMMADEX SODIUM 200 MG/2ML IV SOLN
INTRAVENOUS | Status: DC | PRN
  Administered 2018-10-12: 200 mg via INTRAVENOUS

## 2018-10-12 MED ORDER — ONDANSETRON HCL 4 MG PO TABS: 4.0000 mg | ORAL_TABLET | Freq: Four times a day (QID) | ORAL | Status: DC | PRN

## 2018-10-12 SURGICAL SUPPLY — 35 items
BAG SPEC THK2 15X12 ZIP CLS (MISCELLANEOUS) ×1
BAG ZIPLOCK 12X15 (MISCELLANEOUS) ×3 IMPLANT
BNDG GAUZE ELAST 4 BULKY (GAUZE/BANDAGES/DRESSINGS) ×3 IMPLANT
CLOSURE WOUND 1/2 X4 (GAUZE/BANDAGES/DRESSINGS) ×2
COVER SURGICAL LIGHT HANDLE (MISCELLANEOUS) ×3 IMPLANT
COVER WAND RF STERILE (DRAPES) IMPLANT
DRAPE STERI IOBAN 125X83 (DRAPES) ×3 IMPLANT
DRSG MEPILEX BORDER 4X4 (GAUZE/BANDAGES/DRESSINGS) ×3 IMPLANT
DRSG MEPILEX BORDER 4X8 (GAUZE/BANDAGES/DRESSINGS) IMPLANT
DURAPREP 26ML APPLICATOR (WOUND CARE) ×3 IMPLANT
ELECT REM PT RETURN 15FT ADLT (MISCELLANEOUS) ×3 IMPLANT
GLOVE BIO SURGEON STRL SZ7 (GLOVE) ×3 IMPLANT
GLOVE BIO SURGEON STRL SZ8 (GLOVE) ×3 IMPLANT
GLOVE BIOGEL PI IND STRL 7.0 (GLOVE) ×1 IMPLANT
GLOVE BIOGEL PI IND STRL 8 (GLOVE) ×1 IMPLANT
GLOVE BIOGEL PI INDICATOR 7.0 (GLOVE) ×2
GLOVE BIOGEL PI INDICATOR 8 (GLOVE) ×2
GOWN STRL REUS W/TWL LRG LVL3 (GOWN DISPOSABLE) ×6 IMPLANT
HOLDER FOLEY CATH W/STRAP (MISCELLANEOUS) ×3 IMPLANT
KIT BASIN OR (CUSTOM PROCEDURE TRAY) ×3 IMPLANT
KIT TURNOVER KIT A (KITS) IMPLANT
MANIFOLD NEPTUNE II (INSTRUMENTS) ×3 IMPLANT
NS IRRIG 1000ML POUR BTL (IV SOLUTION) ×3 IMPLANT
PACK GENERAL/GYN (CUSTOM PROCEDURE TRAY) ×3 IMPLANT
PIN GUIDE DRILL TIP 2.8X300 (DRILL) ×12 IMPLANT
PROTECTOR NERVE ULNAR (MISCELLANEOUS) ×3 IMPLANT
SCREW CANN 6.5X100X40MM THRD (Screw) ×6 IMPLANT
SCREW CANNULATED 6.5X105MM (Screw) ×3 IMPLANT
STRIP CLOSURE SKIN 1/2X4 (GAUZE/BANDAGES/DRESSINGS) ×4 IMPLANT
SUT MNCRL AB 4-0 PS2 18 (SUTURE) ×3 IMPLANT
SUT VIC AB 1 CT1 36 (SUTURE) ×3 IMPLANT
SUT VIC AB 2-0 CT1 27 (SUTURE) ×3
SUT VIC AB 2-0 CT1 TAPERPNT 27 (SUTURE) ×1 IMPLANT
TOWEL OR 17X26 10 PK STRL BLUE (TOWEL DISPOSABLE) ×3 IMPLANT
TRAY FOLEY MTR SLVR 16FR STAT (SET/KITS/TRAYS/PACK) IMPLANT

## 2018-10-12 NOTE — Transfer of Care (Signed)
Immediate Anesthesia Transfer of Care Note  Patient: Tyler [name]  Procedure(s) Performed: Left hip pinning (Left Hip)  Patient Location: PACU  Anesthesia Type:General  Level of Consciousness: awake, alert  and oriented  Airway & Oxygen Therapy: Patient Spontanous Breathing and Patient connected to face mask oxygen  Post-op Assessment: Report given to RN and Post -op Vital signs reviewed and stable  Post vital signs: Reviewed and stable  Last Vitals:  Vitals Value Taken Time  BP 145/85 10/12/2018  6:15 PM  Temp    Pulse 80 10/12/2018  6:16 PM  Resp 22 10/12/2018  6:16 PM  SpO2 100 % 10/12/2018  6:16 PM  Vitals shown include unvalidated device data.  Last Pain:  Vitals:   10/12/18 1458  TempSrc:   PainSc: 7          Complications: No apparent anesthesia complications

## 2018-10-12 NOTE — Op Note (Signed)
OPERATIVE REPORT   POSTOPERATIVE DIAGNOSIS: Left femoral neck fracture.   POSTOPERATIVE DIAGNOSIS: Left  femoral neck fracture.   PROCEDURE: In situ pinning, Left hip.   SURGEON: Gaynelle Arabian, M.D.   ASSISTANT: None.   ANESTHESIA: General.   ESTIMATED BLOOD LOSS: Minimal.   DRAINS: None.   COMPLICATIONS: None.   CONDITION: Stable to recovery.   BRIEF CLINICAL NOTE: Tyler [name] is an 85 y.o. male, had a fall last week  sustaining a minimally displaced  Left femoral neck  fracture. The patient has intense pain in the Left hip. He presented to the office and was diagnosed with the fractureThe patient presents for in situ pinning of the Left femoral neck fracture.   PROCEDURE IN DETAIL: After successful administration of General. anesthetic, the patient was placed on the fracture table. The Left lower extremity in well-padded traction boot, Right lower extremity in well-padded leg holder. We did not need to apply traction as this was a nondisplaced fracture. The C-arm spot AP and lateral confirmed that it  did not displace during positioning on the bed. Thigh was then prepped  and draped in usual sterile fashion. The guide pin for the Biomet  cannulated screws was placed over the top of the thigh   so that it projects in the center of femoral head  and neck on AP fluoroscopic view. This served as a marker for the incision which was made over the lateral part of the femur. About a 2-inch incision was then  made at the appropriate place. The skin was cut through the  subcutaneous tissue to the fascia lata, which was incised in line with  skin incision. Guidepin was passed so that this is in the center of  femoral head and neck on the AP view. Another one was placed inferior  to this and slightly posterior and another one was placed superior to  this and slightly posterior. The lengths are 95, 95, and 100 respectively. The 6.5 mm partially threaded Biomet screws were passed over the  guidepin and tightened  down to the lateral cortex of the femur with excellent purchase of the  screws. It effectively compressed the fracture site. The guidepins  were removed. Fluoro spots were taken AP and lateral, confirming excellent  position of hardware. The incision was then cleaned and dried, and the  deep tissue was closed with interrupted 2-0 Vicryl, subcu with  interrupted 2-0 Vicryl, subcuticular running 4-0 Monocryl.  20 mL of 0.25% Marcaine  with epinephrine was injected into the subcutaneous tissues. The  incision was cleaned and dried. Steri-Strips and sterile dressing  applied. The patient was awakened and transported to recovery in stable  condition.   Gaynelle Arabian, M.D.

## 2018-10-12 NOTE — Anesthesia Preprocedure Evaluation (Signed)
Anesthesia Evaluation  Patient identified by MRN, date of birth, ID band Patient awake    Reviewed: Allergy & Precautions, NPO status , Patient's Chart, lab work & pertinent test results  Airway Mallampati: II  TM Distance: >3 FB Neck ROM: Full    Dental no notable dental hx.    Pulmonary neg pulmonary ROS, former smoker,    Pulmonary exam normal breath sounds clear to auscultation       Cardiovascular hypertension, Normal cardiovascular exam Rhythm:Regular Rate:Normal     Neuro/Psych CVA negative psych ROS   GI/Hepatic negative GI ROS, Neg liver ROS,   Endo/Other  diabetes  Renal/GU negative Renal ROS  negative genitourinary   Musculoskeletal negative musculoskeletal ROS (+)   Abdominal   Peds negative pediatric ROS (+)  Hematology Took plavix yesterday   Anesthesia Other Findings   Reproductive/Obstetrics negative OB ROS                             Anesthesia Physical Anesthesia Plan  ASA: III  Anesthesia Plan: General   Post-op Pain Management:    Induction: Intravenous  PONV Risk Score and Plan: 2 and Ondansetron, Dexamethasone and Treatment may vary due to age or medical condition  Airway Management Planned: Oral ETT  Additional Equipment:   Intra-op Plan:   Post-operative Plan: Extubation in OR  Informed Consent: I have reviewed the patients History and Physical, chart, labs and discussed the procedure including the risks, benefits and alternatives for the proposed anesthesia with the patient or authorized representative who has indicated his/her understanding and acceptance.     Dental advisory given  Plan Discussed with: CRNA and Surgeon  Anesthesia Plan Comments:         Anesthesia Quick Evaluation

## 2018-10-12 NOTE — Discharge Instructions (Signed)
Dr. Gaynelle Arabian Total Joint Specialist [facility] [address], [place], [place] [address] [phone]  [place], Guidelines Following Surgery   HOME CARE INSTRUCTIONS   Remove items at home which could result in a fall. This includes throw rugs or furniture in walking pathways.   ICE to the affected hip every three hours for 30 minutes at a time and then as needed for pain and swelling.  Continue to use ice on the hip for pain and swelling from surgery. You may notice swelling that will progress down to the foot and ankle.  This is normal after surgery.  Elevate the leg when you are not up walking on it.    Continue to use the breathing machine which will help keep your temperature down.  It is common for your temperature to cycle up and down following surgery, especially at night when you are not up moving around and exerting yourself.  The breathing machine keeps your lungs expanded and your temperature down.  DRESSING / WOUND CARE / SHOWERING You may shower 3 days after surgery, but keep the wounds dry during showering.  You may use an occlusive plastic wrap (Press'n Seal for example), NO SOAKING/SUBMERGING IN THE BATHTUB.  If the bandage gets wet, change with a clean dry gauze.  If the incision gets wet, pat the wound dry with a clean towel. You may start showering once you are discharged home but do not submerge the incision under water. Just pat the incision dry and apply a dry gauze dressing on daily. Change the surgical dressing daily and reapply a dry dressing each time.  ACTIVITY Walk with your walker as instructed. Use walker as long as suggested by your caregivers. Avoid periods of inactivity such as sitting longer than an hour when not asleep. This helps prevent blood clots.  Do not drive a car for 6 weeks or until released by you surgeon.  Do not drive while taking narcotics.  WEIGHT  BEARING Weight bearing as tolerated with assist device (walker, cane, etc) as directed, use it as long as suggested by your surgeon or therapist, typically at least 4-6 weeks.  MEDICATIONS See your medication summary on the After Visit Summary that the nursing staff will review with you prior to discharge.  You may have some home medications which will be placed on hold until you complete the course of blood thinner medication.  It is important for you to complete the blood thinner medication as prescribed by your surgeon.  Continue your approved medications as instructed at time of discharge.                                                 FOLLOW-UP APPOINTMENTS Make sure you keep all of your appointments after your operation with your surgeon and caregivers. You should call the office at the above phone number and make an appointment for approximately two weeks after the date of your surgery or on the date instructed by your surgeon outlined in the "After Visit Summary".  IF YOU ARE TRANSFERRED TO A SKILLED REHAB FACILITY If the patient is transferred to a skilled rehab facility following release from the hospital, a list of the current medications will be sent to the facility for the patient to continue.  When discharged from the  skilled rehab facility, please have the facility set up the patient's [place] prior to being released. Also, the skilled facility will be responsible for providing the patient with their medications at time of release from the facility to include their pain medication, the muscle relaxants, and their blood thinner medication. If the patient is still at the rehab facility at time of the two week follow up appointment, the skilled rehab facility will also need to assist the patient in arranging follow up appointment in our office and any transportation needs.  MAKE SURE YOU:   Understand these instructions.   Get help right away if you are not doing  well or get worse.   Do not submerge incision under water. Please use good hand washing techniques while changing dressing each day. May shower starting three days after surgery. Please use a clean towel to pat the incision dry following showers. Continue to use ice for pain and swelling after surgery. Do not use any lotions or creams on the incision until instructed by your surgeon.

## 2018-10-12 NOTE — Anesthesia Procedure Notes (Signed)
Procedure Name: Intubation Performed by: Gean Maidens, CRNA Pre-anesthesia Checklist: Patient identified, Emergency Drugs available, Suction available, Patient being monitored and Timeout performed Patient Re-evaluated:Patient Re-evaluated prior to induction Oxygen Delivery Method: Circle system utilized Preoxygenation: Pre-oxygenation with 100% oxygen Induction Type: IV induction and Rapid sequence Laryngoscope Size: Mac and 4 Grade View: Grade I Tube type: Oral Tube size: 7.5 mm Number of attempts: 1 Airway Equipment and Method: Stylet Placement Confirmation: ETT inserted through vocal cords under direct vision,  positive ETCO2 and breath sounds checked- equal and bilateral Secured at: 23 cm Tube secured with: Tape Dental Injury: Teeth and Oropharynx as per pre-operative assessment

## 2018-10-12 NOTE — H&P (Signed)
Patient ID: Tyler [name] MRN: [id number] DOB/AGE: 83-Apr-1930 83 y.o.  Admit date: 10/12/2018  Chief Complaint:  Left hip pain  Subjective: Patient is admitted for left hip pinning due to left femoral neck fracture  Tyler [name] is an 85 y/o male who was seen by Dione Plover. Aluisio, MD in our office for new onset left hip pain on 10/07/2018. Patient with history of recent right pubic rami fractures, reported that he was ambulating with his walker when he stumbled and jarred the left hip. This injury occurred a week prior to the office visit, and pain in the left hip was constant, located in the left groin and radiating down the anterior thigh to the knee. Endorsed increased discomfort with any attempted range of motion of the left hip. Pain also exacerbated with weight-bearing. X-rays were taken in the office, which demonstrated a potential femoral neck fracture. There appeared to be a faint, subtle fracture line in the superior femoral neck as well as a slight tilt of the femoral head compared to x-rays from 09/2018. An MRI was obtained of the left hip to determine whether the fracture was displaced versus nondisplaced, and complete versus noncomplete. Imaging study confirmed a displaced complete left femoral neck fracture. Surgical intervention was deemed medically necessary, and risks and benefits of a left hip pinning were discussed in detail with the patient and his family. They elected to proceed with surgery, and they present to [facility] today on 10/12/2018 for a left hip pinning.   Allergies: Allergies  Allergen Reactions   Sulfa Drugs Cross Reactors Anaphylaxis and Rash    Lungs fill with fluid     Medications: Medications Prior to Admission  Medication Sig Dispense Refill Last Dose   acetaminophen (TYLENOL) 325 MG tablet Take 2 tablets (650 mg total) by mouth every 6 (six) hours. (Patient taking differently: Take 650 mg by mouth every 6 (six) hours as needed  (pain.). )   Taking   amLODipine (NORVASC) 2.5 MG tablet Take 1 tablet (2.5 mg total) by mouth at bedtime. (Patient taking differently: Take 2.5 mg by mouth daily. ) 30 tablet 0 Taking   atorvastatin (LIPITOR) 20 MG tablet Take 1 tablet (20 mg total) by mouth daily. (Patient taking differently: Take 20 mg by mouth at bedtime. ) 30 tablet 0 Taking   Cholecalciferol (VITAMIN D3) 50 MCG (2000 UT) TABS Take 2,000 Units by mouth daily.      clopidogrel (PLAVIX) 75 MG tablet Take 1 tablet (75 mg total) by mouth daily with breakfast. 30 tablet 6 Taking   docusate sodium (COLACE) 100 MG capsule Take 200 mg by mouth at bedtime.      Flaxseed, Linseed, (FLAXSEED OIL) 1000 MG CAPS Take 1,000 mg by mouth 2 (two) times a day.    Taking   losartan (COZAAR) 50 MG tablet Take 1 tablet (50 mg total) by mouth daily. (Patient taking differently: Take 50 mg by mouth every evening. ) 30 tablet 0 Taking   Melatonin 5 MG TABS Take 5 mg by mouth at bedtime.      pantoprazole (PROTONIX) 40 MG tablet Take 1 tablet (40 mg total) by mouth daily. 30 tablet 0 Taking   sertraline (ZOLOFT) 50 MG tablet Take 100 mg by mouth daily.   Taking   traMADol (ULTRAM) 50 MG tablet Take 50 mg by mouth 2 (two) times a day.      vitamin B-12 (CYANOCOBALAMIN) 1000 MCG tablet Take 1,000 mcg by mouth  at bedtime.      camphor-menthol (SARNA) lotion Apply topically as needed for itching. 222 mL 0 Taking   glimepiride (AMARYL) 2 MG tablet Take 1 mg by mouth daily with breakfast.      Lancets (ONETOUCH ULTRASOFT) lancets    Taking   Multiple Vitamin (MULTIVITAMIN WITH MINERALS) TABS tablet Take 1 tablet by mouth daily. (Patient not taking: Reported on 10/11/2018)   Not Taking at Unknown time   ONE TOUCH ULTRA TEST test strip    Taking    Past Medical History: Past Medical History:  Diagnosis Date   Cancer [facility])    Prostate   Diabetes mellitus without complication ([place])    Hypertension    Kidney stones    Stroke [facility])        Past Surgical History: Past Surgical History:  Procedure Laterality Date   CARDIAC CATHETERIZATION     cyst surgery on back     removed from end of spine 50 years ago   FEMUR IM NAIL Right 11/17/2015   Procedure: INTRAMEDULLARY (IM) NAIL FEMORAL;  Surgeon: Marybelle Killings, MD;  Location: [place];  Service: Orthopedics;  Laterality: Right;   KNEE ARTHROSCOPY     both knees   PROSTATE SURGERY     SHOULDER SURGERY     right shoulder     Family History: Family History  Problem Relation Age of Onset   Emphysema Father    Heart Problems Mother     Social History: Social History   Tobacco Use   Smoking status: Former Smoker    Last attempt to quit: 05/04/1968    Years since quitting: 50.4   Smokeless tobacco: Never Used  Substance Use Topics   Alcohol use: No     Review of Systems Pertinent items are noted in HPI.  Vitals: Pulse:90 Respirations:18 Blood Pressure:189/86 mmHg  Physical Exam: General: alert and no distress HENT:Head: Normocephalic, no lesions, without obvious abnormality. Neck:supple Chest:clear to auscultation, no wheezes, rales or rhonchi, symmetric air entry Heart:S1, S2 normal, no murmur, rub or gallop, regular rate and rhythm Abdomen:abdomen soft and non-tender Rectal/Breast/Genitalia: Not done, not pertinent to present illness. Musculoskeletal: Left Hip Exam: Attempted range of motion is met with significant pain.  There is no tenderness over the greater trochanter bursa.    LABS: Results for orders placed or performed during the hospital encounter of 06/15/18  CBC WITH DIFFERENTIAL  Result Value Ref Range   WBC 9.1 4.0 - 10.5 K/uL   RBC 3.79 (L) 4.22 - 5.81 MIL/uL   Hemoglobin 11.6 (L) 13.0 - 17.0 g/dL   HCT 36.6 (L) 39.0 - 52.0 %   MCV 96.6 80.0 - 100.0 fL   MCH 30.6 26.0 - 34.0 pg   MCHC 31.7 30.0 - 36.0 g/dL   RDW 13.1 11.5 - 15.5 %   Platelets 170 150 - 400 K/uL   nRBC 0.0 0.0 - 0.2 %   Neutrophils Relative % 65 %    Neutro Abs 6.0 1.7 - 7.7 K/uL   Lymphocytes Relative 18 %   Lymphs Abs 1.6 0.7 - 4.0 K/uL   Monocytes Relative 7 %   Monocytes Absolute 0.7 0.1 - 1.0 K/uL   Eosinophils Relative 8 %   Eosinophils Absolute 0.8 (H) 0.0 - 0.5 K/uL   Basophils Relative 1 %   Basophils Absolute 0.1 0.0 - 0.1 K/uL   Immature Granulocytes 1 %   Abs Immature Granulocytes 0.06 0.00 - 0.07 K/uL  Comprehensive metabolic panel  Result  Value Ref Range   Sodium 138 135 - 145 mmol/L   Potassium 4.5 3.5 - 5.1 mmol/L   Chloride 106 98 - 111 mmol/L   CO2 23 22 - 32 mmol/L   Glucose, Bld 134 (H) 70 - 99 mg/dL   BUN 35 (H) 8 - 23 mg/dL   Creatinine, Ser 1.70 (H) 0.61 - 1.24 mg/dL   Calcium 9.4 8.9 - 10.3 mg/dL   Total Protein 6.2 (L) 6.5 - 8.1 g/dL   Albumin 3.5 3.5 - 5.0 g/dL   AST 17 15 - 41 U/L   ALT 25 0 - 44 U/L   Alkaline Phosphatase 59 38 - 126 U/L   Total Bilirubin 1.0 0.3 - 1.2 mg/dL   GFR calc non Af Amer 35 (L) >60 mL/min   GFR calc Af Amer 41 (L) >60 mL/min   Anion gap 9 5 - 15  Glucose, capillary  Result Value Ref Range   Glucose-Capillary 157 (H) 70 - 99 mg/dL   Comment 1 Notify RN   Glucose, capillary  Result Value Ref Range   Glucose-Capillary 108 (H) 70 - 99 mg/dL  Glucose, capillary  Result Value Ref Range   Glucose-Capillary 180 (H) 70 - 99 mg/dL  Glucose, capillary  Result Value Ref Range   Glucose-Capillary 138 (H) 70 - 99 mg/dL  Glucose, capillary  Result Value Ref Range   Glucose-Capillary 183 (H) 70 - 99 mg/dL  Glucose, capillary  Result Value Ref Range   Glucose-Capillary 129 (H) 70 - 99 mg/dL  Glucose, capillary  Result Value Ref Range   Glucose-Capillary 155 (H) 70 - 99 mg/dL   Comment 1 Notify RN   Glucose, capillary  Result Value Ref Range   Glucose-Capillary 82 70 - 99 mg/dL   Comment 1 Notify RN   Glucose, capillary  Result Value Ref Range   Glucose-Capillary 195 (H) 70 - 99 mg/dL  Glucose, capillary  Result Value Ref Range   Glucose-Capillary 122 (H) 70 - 99  mg/dL  Glucose, capillary  Result Value Ref Range   Glucose-Capillary 168 (H) 70 - 99 mg/dL  Glucose, capillary  Result Value Ref Range   Glucose-Capillary 108 (H) 70 - 99 mg/dL  Glucose, capillary  Result Value Ref Range   Glucose-Capillary 158 (H) 70 - 99 mg/dL  Glucose, capillary  Result Value Ref Range   Glucose-Capillary 113 (H) 70 - 99 mg/dL  Glucose, capillary  Result Value Ref Range   Glucose-Capillary 241 (H) 70 - 99 mg/dL  Glucose, capillary  Result Value Ref Range   Glucose-Capillary 130 (H) 70 - 99 mg/dL  Glucose, capillary  Result Value Ref Range   Glucose-Capillary 104 (H) 70 - 99 mg/dL   Comment 1 Notify RN   Glucose, capillary  Result Value Ref Range   Glucose-Capillary 112 (H) 70 - 99 mg/dL   Comment 1 Notify RN   Troponin I - Once  Result Value Ref Range   Troponin I <0.03 <0.03 ng/mL  Urinalysis, Complete w Microscopic  Result Value Ref Range   Color, Urine YELLOW YELLOW   APPearance CLEAR CLEAR   Specific Gravity, Urine 1.015 1.005 - 1.030   pH 5.0 5.0 - 8.0   Glucose, UA NEGATIVE NEGATIVE mg/dL   Hgb urine dipstick MODERATE (A) NEGATIVE   Bilirubin Urine NEGATIVE NEGATIVE   Ketones, ur NEGATIVE NEGATIVE mg/dL   Protein, ur NEGATIVE NEGATIVE mg/dL   Nitrite NEGATIVE NEGATIVE   Leukocytes,Ua NEGATIVE NEGATIVE   RBC / HPF 6-10 0 -  5 RBC/hpf   WBC, UA 0-5 0 - 5 WBC/hpf   Bacteria, UA NONE SEEN NONE SEEN   Squamous Epithelial / LPF 0-5 0 - 5   Hyaline Casts, UA PRESENT   Glucose, capillary  Result Value Ref Range   Glucose-Capillary 136 (H) 70 - 99 mg/dL  Glucose, capillary  Result Value Ref Range   Glucose-Capillary 138 (H) 70 - 99 mg/dL  Glucose, capillary  Result Value Ref Range   Glucose-Capillary 151 (H) 70 - 99 mg/dL  Glucose, capillary  Result Value Ref Range   Glucose-Capillary 99 70 - 99 mg/dL  Basic metabolic panel  Result Value Ref Range   Sodium 137 135 - 145 mmol/L   Potassium 4.7 3.5 - 5.1 mmol/L   Chloride 105 98 - 111  mmol/L   CO2 20 (L) 22 - 32 mmol/L   Glucose, Bld 200 (H) 70 - 99 mg/dL   BUN 40 (H) 8 - 23 mg/dL   Creatinine, Ser 1.72 (H) 0.61 - 1.24 mg/dL   Calcium 9.7 8.9 - 10.3 mg/dL   GFR calc non Af Amer 35 (L) >60 mL/min   GFR calc Af Amer 41 (L) >60 mL/min   Anion gap 12 5 - 15  Glucose, capillary  Result Value Ref Range   Glucose-Capillary 141 (H) 70 - 99 mg/dL  Glucose, capillary  Result Value Ref Range   Glucose-Capillary 113 (H) 70 - 99 mg/dL  Glucose, capillary  Result Value Ref Range   Glucose-Capillary 173 (H) 70 - 99 mg/dL  Glucose, capillary  Result Value Ref Range   Glucose-Capillary 102 (H) 70 - 99 mg/dL  Glucose, capillary  Result Value Ref Range   Glucose-Capillary 192 (H) 70 - 99 mg/dL  Glucose, capillary  Result Value Ref Range   Glucose-Capillary 134 (H) 70 - 99 mg/dL  Glucose, capillary  Result Value Ref Range   Glucose-Capillary 157 (H) 70 - 99 mg/dL  Glucose, capillary  Result Value Ref Range   Glucose-Capillary 98 70 - 99 mg/dL  Glucose, capillary  Result Value Ref Range   Glucose-Capillary 153 (H) 70 - 99 mg/dL  Glucose, capillary  Result Value Ref Range   Glucose-Capillary 76 70 - 99 mg/dL  Glucose, capillary  Result Value Ref Range   Glucose-Capillary 241 (H) 70 - 99 mg/dL  Glucose, capillary  Result Value Ref Range   Glucose-Capillary 94 70 - 99 mg/dL  Glucose, capillary  Result Value Ref Range   Glucose-Capillary 188 (H) 70 - 99 mg/dL  Glucose, capillary  Result Value Ref Range   Glucose-Capillary 65 (L) 70 - 99 mg/dL  Glucose, capillary  Result Value Ref Range   Glucose-Capillary 130 (H) 70 - 99 mg/dL  Glucose, capillary  Result Value Ref Range   Glucose-Capillary 87 70 - 99 mg/dL  Glucose, capillary  Result Value Ref Range   Glucose-Capillary 154 (H) 70 - 99 mg/dL  Glucose, capillary  Result Value Ref Range   Glucose-Capillary 145 (H) 70 - 99 mg/dL  Glucose, capillary  Result Value Ref Range   Glucose-Capillary 143 (H) 70 - 99  mg/dL   Comment 1 Notify RN   Glucose, capillary  Result Value Ref Range   Glucose-Capillary 101 (H) 70 - 99 mg/dL   Comment 1 Notify RN   Glucose, capillary  Result Value Ref Range   Glucose-Capillary 161 (H) 70 - 99 mg/dL   Comment 1 Document in Chart   Glucose, capillary  Result Value Ref Range   Glucose-Capillary 92  70 - 99 mg/dL   Comment 1 Notify RN   Basic metabolic panel  Result Value Ref Range   Sodium 138 135 - 145 mmol/L   Potassium 4.8 3.5 - 5.1 mmol/L   Chloride 110 98 - 111 mmol/L   CO2 21 (L) 22 - 32 mmol/L   Glucose, Bld 93 70 - 99 mg/dL   BUN 32 (H) 8 - 23 mg/dL   Creatinine, Ser 1.46 (H) 0.61 - 1.24 mg/dL   Calcium 9.4 8.9 - 10.3 mg/dL   GFR calc non Af Amer 43 (L) >60 mL/min   GFR calc Af Amer 49 (L) >60 mL/min   Anion gap 7 5 - 15  CBC  Result Value Ref Range   WBC 8.5 4.0 - 10.5 K/uL   RBC 3.56 (L) 4.22 - 5.81 MIL/uL   Hemoglobin 11.2 (L) 13.0 - 17.0 g/dL   HCT 34.6 (L) 39.0 - 52.0 %   MCV 97.2 80.0 - 100.0 fL   MCH 31.5 26.0 - 34.0 pg   MCHC 32.4 30.0 - 36.0 g/dL   RDW 13.2 11.5 - 15.5 %   Platelets 249 150 - 400 K/uL   nRBC 0.0 0.0 - 0.2 %  Glucose, capillary  Result Value Ref Range   Glucose-Capillary 219 (H) 70 - 99 mg/dL   Comment 1 Notify RN   Glucose, capillary  Result Value Ref Range   Glucose-Capillary 93 70 - 99 mg/dL    Assessment/Plan: Nondisplaced left femoral neck fracture  The patient is being admitted to [facility] to undergo a left hip pinning. Surgery will be performed by Dione Plover. Aluisio, MD. Risks and benefits have been discussed with the patient and family and they elect to proceed wth the procedure.   Theresa Duty, PA-C Orthopedic Surgery [facility] [place]

## 2018-10-12 NOTE — Anesthesia Postprocedure Evaluation (Signed)
Anesthesia Post Note  Patient: WAEL MAESTAS  Procedure(s) Performed: Left hip pinning (Left Hip)     Patient location during evaluation: PACU Anesthesia Type: General Level of consciousness: awake and alert Pain management: pain level controlled Vital Signs Assessment: post-procedure vital signs reviewed and stable Respiratory status: spontaneous breathing, nonlabored ventilation, respiratory function stable and patient connected to nasal cannula oxygen Cardiovascular status: blood pressure returned to baseline and stable Postop Assessment: no apparent nausea or vomiting Anesthetic complications: no    Last Vitals:  Vitals:   10/12/18 1930 10/12/18 1955  BP: (!) 174/64 (!) 175/68  Pulse:  84  Resp:  18  Temp:  36.7 C  SpO2:  100%    Last Pain:  Vitals:   10/12/18 1955  TempSrc: Oral  PainSc:                  Kiwan Gadsden S

## 2018-10-13 ENCOUNTER — Encounter (HOSPITAL_COMMUNITY): Payer: Self-pay | Admitting: Orthopedic Surgery

## 2018-10-13 LAB — CBC
HCT: 32.4 % — ABNORMAL LOW (ref 39.0–52.0)
Hemoglobin: 10.2 g/dL — ABNORMAL LOW (ref 13.0–17.0)
MCH: 31.5 pg (ref 26.0–34.0)
MCHC: 31.5 g/dL (ref 30.0–36.0)
MCV: 100 fL (ref 80.0–100.0)
Platelets: 206 10*3/uL (ref 150–400)
RBC: 3.24 MIL/uL — ABNORMAL LOW (ref 4.22–5.81)
RDW: 12.7 % (ref 11.5–15.5)
WBC: 10.5 10*3/uL (ref 4.0–10.5)
nRBC: 0 % (ref 0.0–0.2)

## 2018-10-13 LAB — BASIC METABOLIC PANEL
Anion gap: 9 (ref 5–15)
BUN: 32 mg/dL — ABNORMAL HIGH (ref 8–23)
CO2: 22 mmol/L (ref 22–32)
Calcium: 9 mg/dL (ref 8.9–10.3)
Chloride: 105 mmol/L (ref 98–111)
Creatinine, Ser: 1.36 mg/dL — ABNORMAL HIGH (ref 0.61–1.24)
GFR calc Af Amer: 54 mL/min — ABNORMAL LOW (ref >60)
GFR calc non Af Amer: 46 mL/min — ABNORMAL LOW (ref >60)
Glucose, Bld: 242 mg/dL — ABNORMAL HIGH (ref 70–99)
Potassium: 4.8 mmol/L (ref 3.5–5.1)
Sodium: 136 mmol/L (ref 135–145)

## 2018-10-13 LAB — GLUCOSE, CAPILLARY
Glucose-Capillary: 203 mg/dL — ABNORMAL HIGH (ref 70–99)
Glucose-Capillary: 121 mg/dL — ABNORMAL HIGH (ref 70–99)
Glucose-Capillary: 124 mg/dL — ABNORMAL HIGH (ref 70–99)
Glucose-Capillary: 178 mg/dL — ABNORMAL HIGH (ref 70–99)

## 2018-10-13 MED ORDER — METHOCARBAMOL 500 MG PO TABS: 500.0000 mg | ORAL_TABLET | Freq: Four times a day (QID) | ORAL | 0 refills | Status: DC | PRN

## 2018-10-13 MED ORDER — HYDROCODONE-ACETAMINOPHEN 5-325 MG PO TABS: 1.0000 | ORAL_TABLET | Freq: Four times a day (QID) | ORAL | 0 refills | Status: DC | PRN

## 2018-10-13 MED ORDER — ASPIRIN 325 MG PO TBEC: 325.0000 mg | DELAYED_RELEASE_TABLET | Freq: Every day | ORAL | 0 refills | Status: DC

## 2018-10-13 MED ORDER — BISACODYL 10 MG RE SUPP
10.0000 mg | Freq: Once | RECTAL | Status: AC
  Administered 2018-10-13: 10 mg via RECTAL
  Filled 2018-10-13: qty 1

## 2018-10-13 NOTE — Plan of Care (Signed)
Reviewed use of call bell for needs/safety and plan of care

## 2018-10-13 NOTE — Progress Notes (Signed)
  10/13/2018 1 Day Post-Op Procedure(s) (LRB): Left hip pinning (Left) Patient reports pain as mild.   Patient seen in rounds with Dr. Wynelle Link. Patient is well, and has had no acute complaints or problems. No issues overnight. Reports he has not had a bowel movement in over 5 days and has some abdominal discomfort. Also states his appetite has been severely reduced due to pain. Denies chest pain or SOB. Foley catheter to be discontinued this AM. They will be WBAT to the left. Plan is to go Home after hospital stay.  Vital signs in last 24 hours: Temp:  [97.7 F (36.5 C)-98 F (36.7 C)] 97.8 F (36.6 C) (06/11 0616) Pulse Rate:  [71-93] 89 (06/11 0616) Resp:  [15-27] 18 (06/11 0616) BP: (145-189)/(60-105) 166/67 (06/11 0616) SpO2:  [93 %-100 %] 100 % (06/11 0616) Weight:  [70.3 kg] 70.3 kg (06/10 1458)  I&O's: I/O last 3 completed shifts: In: 2411.7 [P.O.:170; I.V.:2041.7; IV Piggyback:200] Out: 640 [Urine:590; Blood:50] No intake/output data recorded.  Labs: Recent Labs    10/12/18 1526 10/13/18 0513  HGB 12.3* 10.2*   Recent Labs    10/12/18 1526 10/13/18 0513  WBC 7.4 10.5  RBC 3.87* 3.24*  HCT 38.6* 32.4*  PLT 227 206   Recent Labs    10/12/18 1526 10/13/18 0513  NA 137 136  K 4.2 4.8  CL 107 105  CO2 21* 22  BUN 30* 32*  CREATININE 1.36* 1.36*  GLUCOSE 128* 242*  CALCIUM 9.7 9.0   Exam: General - Patient is Alert and Oriented Extremity - Neurologically intact Neurovascular intact Sensation intact distally Dorsiflexion/Plantar flexion intact Dressing - with bloody drainage this AM, new mepilex applied. Motor Function - intact, moving foot and toes well on exam.   Past Medical History:  Diagnosis Date  . Cancer [facility])    Prostate  . Diabetes mellitus without complication ([place])   . Hypertension   . Kidney stones   . Stroke [facility])     Assessment/Plan: 1 Day Post-Op Procedure(s) (LRB): Left hip pinning (Left) Active Problems:   Fracture of  femoral neck, left, closed ([facility])  Estimated body mass index is 22.24 kg/m as calculated from the following:   Height as of this encounter: 5\' 10"  (1.778 m).   Weight as of this encounter: 70.3 kg. Advance diet Up with therapy  DVT Prophylaxis - Aspirin Weight-bearing as tolerated D/C O2 and pulse ox, try on room air.  Will order dulcolax in addition to the BID colace to hopefully move bowels. Will continue to monitor.   Up with therapy today, plan for discharge either tomorrow or Saturday pending progress.   Theresa Duty, PA-C Orthopedic Surgery 10/13/2018, 8:10 AM

## 2018-10-13 NOTE — Progress Notes (Signed)
Physical Therapy Treatment Patient Details Name: Tyler [name] MRN: [id number] DOB: 02-16-1929 Today's Date: 10/13/2018    History of Present Illness L IM Nail Hx Prostate CA, HTN    PT Comments    POD # 1 pm session Pt OOB in recliner initially confused about "getting up".  Pt stated, "they told me to stay here and NOT get up".  Introduced myself and redirected that with assist/hospital staff has to be present.  Pt also arguementative about not having a BM "for days".  Offered to assist pt to bathroom, he declined.  After a little TLC, pt did agree to get out of recliner to [facility].  General transfer comment: 75% VC's for safety.  Assisted out of recliner.  Assisted on/off BSC twice for attempted BM. Instructed that walking will help move his bowels.  General Gait Details: 75% safety VC's proper walker to self distance, turns and upright posture.  Unsteady.   Rigid.  Poor self balance correction/awareness.  HIGH FALL RISK. Assisted back to recliner and positioned to comfort. Assisted with meal set up.   Pt plans to D/C back home.  Currently, pt needs Physical Assist with all mobility.  Follow Up Recommendations  Home health PT;Supervision/Assistance - 24 hour     Equipment Recommendations       Recommendations for Other Services       Precautions / Restrictions Precautions Precautions: None    Mobility  Bed Mobility               General bed mobility comments: OOB in recliner  Transfers Overall transfer level: Needs assistance Equipment used: Rolling walker (2 wheeled) Transfers: Sit to/from Stand;Stand Pivot Transfers Sit to Stand: Min assist;Mod assist Stand pivot transfers: Mod assist;Max assist       General transfer comment: 75% VC's for safety.  Assisted out of recliner.  Assisted on/off BSC twice for attempted BM.  Assisted back to recliner.  Ambulation/Gait Ambulation/Gait assistance: Mod assist Gait Distance (Feet): 14 Feet Assistive device: Rolling walker  (2 wheeled) Gait Pattern/deviations: Step-to pattern;Step-through pattern;Decreased stance time - left Gait velocity: decreased   General Gait Details: 75% safety VC's proper walker to self distance, turns and upright posture.  Unsteady.   Rigid.  Poor self balance correction/awareness.  HIGH FALL RISK.   Stairs             Wheelchair Mobility    Modified Rankin (Stroke Patients Only)       Balance                                            Cognition Arousal/Alertness: Awake/alert Behavior During Therapy: WFL for tasks assessed/performed Overall Cognitive Status: Within Functional Limits for tasks assessed                                 General Comments: required repeat safety cueing(hand placement, turn completion, walker use)      Exercises      General Comments        Pertinent Vitals/Pain Pain Assessment: Faces Faces Pain Scale: Hurts little more Pain Location: L hip with increased mobility Pain Descriptors / Indicators: Grimacing Pain Intervention(s): Premedicated before session;Repositioned    Home Living  Prior Function            PT Goals (current goals can now be found in the care plan section) Progress towards PT goals: Progressing toward goals    Frequency    Min 5X/week      PT Plan Current plan remains appropriate    Co-evaluation              AM-PAC PT "6 Clicks" Mobility   Outcome Measure  Help needed turning from your back to your side while in a flat bed without using bedrails?: A Little Help needed moving from lying on your back to sitting on the side of a flat bed without using bedrails?: A Lot Help needed moving to and from a bed to a chair (including a wheelchair)?: A Lot Help needed standing up from a chair using your arms (e.g., wheelchair or bedside chair)?: A Lot Help needed to walk in hospital room?: A Lot Help needed climbing 3-5 steps with a  railing? : Total 6 Click Score: 12    End of Session Equipment Utilized During Treatment: Gait belt Activity Tolerance: No increased pain Patient left: in chair;with bed alarm set;with call bell/phone within reach Nurse Communication: Mobility status PT Visit Diagnosis: Other abnormalities of gait and mobility (R26.89)     Time: 6122-4497 PT Time Calculation (min) (ACUTE ONLY): 32 min  Charges:  $Gait Training: 8-22 mins $Therapeutic Activity: 8-22 mins                     Rica Koyanagi  PTA Acute  Rehabilitation Services Pager      [phone] Office      [phone]

## 2018-10-13 NOTE — Evaluation (Signed)
Physical Therapy Evaluation Patient Details Name: Tyler [name] MRN: [id number] DOB: 25-Nov-1928 Today's Date: 10/13/2018   History of Present Illness  Pt is an 85 year old male s/p L hip pinning due to Nondisplaced left femoral neck fracture with hx of pelvic fractures (06/2018), stroke, DM, HTN, prostate cancer  Clinical Impression  Patient is s/p above surgery resulting in functional limitations due to the deficits listed below (see PT Problem List).  Patient will benefit from skilled PT to increase their independence and safety with mobility to allow discharge to the venue listed below.  Pt assisted with ambulating short distance in hallway and currently requires min to mod assist for mobility.  Plan is for home upon d/c per chart.  Recommend 24/7 assist available for pt safety.  Pt also has 2 steps to enter home.     Follow Up Recommendations Home health PT;Supervision/Assistance - 24 hour    Equipment Recommendations  None recommended by PT    Recommendations for Other Services       Precautions / Restrictions Precautions Precautions: Fall Restrictions Weight Bearing Restrictions: No Other Position/Activity Restrictions: WBAT      Mobility  Bed Mobility Overal bed mobility: Needs Assistance Bed Mobility: Supine to Sit     Supine to sit: Min assist;HOB elevated     General bed mobility comments: verbal cues for technique  Transfers Overall transfer level: Needs assistance Equipment used: Rolling walker (2 wheeled) Transfers: Sit to/from Stand Sit to Stand: Mod assist;+2 physical assistance;+2 safety/equipment;From elevated surface         General transfer comment: assist to rise and steady  Ambulation/Gait Ambulation/Gait assistance: Min assist;+2 safety/equipment Gait Distance (Feet): 30 Feet Assistive device: Rolling walker (2 wheeled) Gait Pattern/deviations: Step-to pattern;Decreased stance time - left;Antalgic Gait velocity: decr   General Gait  Details: verbal cues for sequence, RW positioning, stiff LEs observed so encouraged flexion as tolerated; recliner following for safety  Stairs            Wheelchair Mobility    Modified Rankin (Stroke Patients Only)       Balance Overall balance assessment: History of Falls                                           Pertinent Vitals/Pain Pain Assessment: Faces Faces Pain Scale: Hurts even more Pain Location: right hip Pain Descriptors / Indicators: Aching;Sore Pain Intervention(s): Repositioned;Monitored during session;Ice applied;Limited activity within patient's tolerance    Home Living Family/patient expects to be discharged to:: Private residence Living Arrangements: Spouse/significant other Available Help at Discharge: Family Type of Home: House Home Access: Stairs to enter Entrance Stairs-Rails: Right;Left;Can reach both Entrance Stairs-Number of Steps: 2 Home Layout: One [place]: Environmental consultant - 2 wheels;Walker - 4 wheels      Prior Function Level of Independence: Independent with assistive device(s)         Comments: uses RW for mobility     Hand Dominance        Extremity/Trunk Assessment        Lower Extremity Assessment Lower Extremity Assessment: RLE deficits/detail RLE Deficits / Details: difficulty actively moving R LE, requiring assist for mobility       Communication   Communication: HOH  Cognition Arousal/Alertness: Awake/alert Behavior During Therapy: WFL for tasks assessed/performed Overall Cognitive Status: Within Functional Limits for tasks assessed  General Comments      Exercises     Assessment/Plan    PT Assessment Patient needs continued PT services  PT Problem List Decreased strength;Decreased mobility;Decreased range of motion;Decreased knowledge of precautions;Decreased activity tolerance;Decreased balance;Decreased knowledge of use  of DME;Pain       PT Treatment Interventions DME instruction;Therapeutic activities;Gait training;Therapeutic exercise;Patient/family education;Functional mobility training;Balance training;Stair training    PT Goals (Current goals can be found in the Care Plan section)  Acute Rehab PT Goals PT Goal Formulation: With patient Time For Goal Achievement: 10/20/18 Potential to Achieve Goals: Good    Frequency Min 5X/week   Barriers to discharge        Co-evaluation               AM-PAC PT "6 Clicks" Mobility  Outcome Measure Help needed turning from your back to your side while in a flat bed without using bedrails?: A Little Help needed moving from lying on your back to sitting on the side of a flat bed without using bedrails?: A Little Help needed moving to and from a bed to a chair (including a wheelchair)?: A Lot Help needed standing up from a chair using your arms (e.g., wheelchair or bedside chair)?: A Lot Help needed to walk in hospital room?: A Little Help needed climbing 3-5 steps with a railing? : A Lot 6 Click Score: 15    End of Session Equipment Utilized During Treatment: Gait belt Activity Tolerance: Patient tolerated treatment well Patient left: in chair;with chair alarm set;with call bell/phone within reach Nurse Communication: Mobility status PT Visit Diagnosis: Other abnormalities of gait and mobility (R26.89)    Time: 7505-1833 PT Time Calculation (min) (ACUTE ONLY): 15 min   Charges:   PT Evaluation $PT Eval Low Complexity: Pierce, PT, DPT Acute Rehabilitation Services Office: [phone] Pager: [phone]  Trena Platt 10/13/2018, 12:15 PM

## 2018-10-14 ENCOUNTER — Inpatient Hospital Stay (HOSPITAL_COMMUNITY): Payer: PPO

## 2018-10-14 LAB — BASIC METABOLIC PANEL
Anion gap: 9 (ref 5–15)
BUN: 32 mg/dL — ABNORMAL HIGH (ref 8–23)
CO2: 22 mmol/L (ref 22–32)
Calcium: 9 mg/dL (ref 8.9–10.3)
Chloride: 104 mmol/L (ref 98–111)
Creatinine, Ser: 1.38 mg/dL — ABNORMAL HIGH (ref 0.61–1.24)
GFR calc Af Amer: 53 mL/min — ABNORMAL LOW (ref >60)
GFR calc non Af Amer: 46 mL/min — ABNORMAL LOW (ref >60)
Glucose, Bld: 122 mg/dL — ABNORMAL HIGH (ref 70–99)
Potassium: 4.3 mmol/L (ref 3.5–5.1)
Sodium: 135 mmol/L (ref 135–145)

## 2018-10-14 LAB — CBC
HCT: 29.3 % — ABNORMAL LOW (ref 39.0–52.0)
Hemoglobin: 9.3 g/dL — ABNORMAL LOW (ref 13.0–17.0)
MCH: 31.6 pg (ref 26.0–34.0)
MCHC: 31.7 g/dL (ref 30.0–36.0)
MCV: 99.7 fL (ref 80.0–100.0)
Platelets: 207 10*3/uL (ref 150–400)
RBC: 2.94 MIL/uL — ABNORMAL LOW (ref 4.22–5.81)
RDW: 12.8 % (ref 11.5–15.5)
WBC: 8.6 10*3/uL (ref 4.0–10.5)
nRBC: 0 % (ref 0.0–0.2)

## 2018-10-14 LAB — GLUCOSE, CAPILLARY
Glucose-Capillary: 125 mg/dL — ABNORMAL HIGH (ref 70–99)
Glucose-Capillary: 112 mg/dL — ABNORMAL HIGH (ref 70–99)
Glucose-Capillary: 148 mg/dL — ABNORMAL HIGH (ref 70–99)
Glucose-Capillary: 130 mg/dL — ABNORMAL HIGH (ref 70–99)

## 2018-10-14 MED ORDER — FLEET ENEMA 7-19 GM/118ML RE ENEM
1.0000 | ENEMA | Freq: Once | RECTAL | Status: AC
  Administered 2018-10-14: 10:00:00 1 via RECTAL
  Filled 2018-10-14: qty 1

## 2018-10-14 NOTE — Plan of Care (Signed)
Stool today. Continue POC

## 2018-10-14 NOTE — Progress Notes (Signed)
PHYSICAL THERAPY  Am PT session on hold due to medical  Rica Koyanagi  PTA Acute  Rehabilitation Services Pager      [phone] Office      [phone]

## 2018-10-14 NOTE — Care Management Important Message (Signed)
Important Message  Patient Details IM Letter given to Kathrin Greathouse SW to present to the Patient Name: Tyler [name] MRN: [id number] Date of Birth: 1929-02-13   Medicare Important Message Given:  Yes    Kerin Salen 10/14/2018, 11:15 AM

## 2018-10-14 NOTE — Progress Notes (Signed)
Physical Therapy Treatment Patient Details Name: Tyler [name] MRN: [id number] DOB: August 08, 1928 Today's Date: 10/14/2018    History of Present Illness L IM Nail Hx Prostate CA, HTN    PT Comments    POD # 2 pm session Assisted OOB to [facility] then to amb in hallway.  Very unsteady gait. General Gait Details: 50% VC's on proper walker to self distance and safety with turns.   General transfer comment: 50% VC's on safety with turns and hand placement with stand to sit. Pt will need physical Assist with all mobility at home.  Hx falls. Hx CVA and HIGH FALL RISK.   Follow Up Recommendations  Home health PT;Supervision/Assistance - 24 hour     Equipment Recommendations  None recommended by PT    Recommendations for Other Services       Precautions / Restrictions Precautions Precautions: None Restrictions Weight Bearing Restrictions: No Other Position/Activity Restrictions: WBAT    Mobility  Bed Mobility Overal bed mobility: Needs Assistance Bed Mobility: Supine to Sit     Supine to sit: Min assist;HOB elevated        Transfers Overall transfer level: Needs assistance Equipment used: Rolling walker (2 wheeled) Transfers: Sit to/from [facility] Sit to Stand: Min assist;Mod assist Stand pivot transfers: Mod assist;Max assist       General transfer comment: 50% VC's on safety with turns and hand placement with stand to sit  Ambulation/Gait   Gait Distance (Feet): 32 Feet Assistive device: Rolling walker (2 wheeled) Gait Pattern/deviations: Step-to pattern;Step-through pattern;Decreased stance time - left Gait velocity: decreased   General Gait Details: 50% VC's on proper walker to self distance and safety with turns   Marine scientist Rankin (Stroke Patients Only)       Balance                                            Cognition Arousal/Alertness: Awake/alert Behavior During  Therapy: WFL for tasks assessed/performed Overall Cognitive Status: Within Functional Limits for tasks assessed                                 General Comments: required repeat safety cueing(hand placement, turn completion, walker use)      Exercises      General Comments        Pertinent Vitals/Pain Pain Assessment: Faces Faces Pain Scale: Hurts little more Pain Location: L hip with increased mobility Pain Descriptors / Indicators: Grimacing Pain Intervention(s): Monitored during session;Ice applied;Repositioned    Home Living                      Prior Function            PT Goals (current goals can now be found in the care plan section) Progress towards PT goals: Progressing toward goals    Frequency    Min 5X/week      PT Plan Current plan remains appropriate    Co-evaluation              AM-PAC PT "6 Clicks" Mobility   Outcome Measure  Help needed turning from your back to your side while in a flat bed without using bedrails?: A  Little Help needed moving from lying on your back to sitting on the side of a flat bed without using bedrails?: A Little Help needed moving to and from a bed to a chair (including a wheelchair)?: A Little Help needed standing up from a chair using your arms (e.g., wheelchair or bedside chair)?: A Little Help needed to walk in hospital room?: A Little Help needed climbing 3-5 steps with a railing? : A Little 6 Click Score: 18    End of Session Equipment Utilized During Treatment: Gait belt Activity Tolerance: No increased pain Patient left: in chair;with bed alarm set;with call bell/phone within reach Nurse Communication: Mobility status PT Visit Diagnosis: Other abnormalities of gait and mobility (R26.89)     Time: 7517-0017 PT Time Calculation (min) (ACUTE ONLY): 25 min  Charges:  $Gait Training: 8-22 mins $Therapeutic Activity: 8-22 mins                     {Jonatha Gagen  PTA Acute   Rehabilitation Services Pager      [phone] Office      [phone]

## 2018-10-14 NOTE — Progress Notes (Signed)
   Subjective: 2 Days Post-Op Procedure(s) (LRB): Left hip pinning (Left) Patient reports pain as mild.   Patient seen in rounds with Dr. Wynelle Link. Patient is having problems with constipation and chest pain. He has had quite a bit of anxiety regarding his recovery and inability to have a BM. No SOB but reports some chest wall soreness with respiration and when straining to have a BM. Positive flatus. Catheter in place. Good urine output. He has walked very little with PT.  Plan is to go Home after hospital stay.  Objective: Vital signs in last 24 hours: Temp:  [97.6 F (36.4 C)-98.2 F (36.8 C)] 98 F (36.7 C) (06/12 0634) Pulse Rate:  [74-83] 78 (06/12 0634) Resp:  [14-16] 16 (06/12 0634) BP: (141-165)/(59-67) 165/67 (06/12 0634) SpO2:  [99 %-100 %] 99 % (06/12 0634)  Intake/Output from previous day:  Intake/Output Summary (Last 24 hours) at 10/14/2018 0719 Last data filed at 10/14/2018 0634 Gross per 24 hour  Intake 970 ml  Output 950 ml  Net 20 ml     Labs: Recent Labs    10/12/18 1526 10/13/18 0513 10/14/18 0448  HGB 12.3* 10.2* 9.3*   Recent Labs    10/13/18 0513 10/14/18 0448  WBC 10.5 8.6  RBC 3.24* 2.94*  HCT 32.4* 29.3*  PLT 206 207   Recent Labs    10/13/18 0513 10/14/18 0448  NA 136 135  K 4.8 4.3  CL 105 104  CO2 22 22  BUN 32* 32*  CREATININE 1.36* 1.38*  GLUCOSE 242* 122*  CALCIUM 9.0 9.0    EXAM General - Patient is Alert and Oriented Extremity - Neurologically intact Intact pulses distally Dorsiflexion/Plantar flexion intact No cellulitis present Compartment soft Dressing/Incision - clean, dry, no drainage Motor Function - intact, moving foot and toes well on exam.   Past Medical History:  Diagnosis Date  . Cancer [facility])    Prostate  . Diabetes mellitus without complication ([place])   . Hypertension   . Kidney stones   . Stroke [facility])     Assessment/Plan: 2 Days Post-Op Procedure(s) (LRB): Left hip pinning (Left) Active  Problems:   Fracture of femoral neck, left, closed ([place])  Estimated body mass index is 22.24 kg/m as calculated from the following:   Height as of this encounter: 5\' 10"  (1.778 m).   Weight as of this encounter: 70.3 kg. Advance diet Up with therapy  DVT Prophylaxis - Aspirin Weight-Bearing as tolerated   Will move forward with enema today. Mag citrate should foley be unsuccessful. Will obtain chest xray and EKG to evaluate chest pain. Chest pain appears to be musculoskeletal in nature. Will get medicine on board should issues continue. Remove foley today. Hopeful for DC home this weekend pending progress.   Ardeen Jourdain, PA-C Orthopaedic Surgery 10/14/2018, 7:19 AM

## 2018-10-15 LAB — GLUCOSE, CAPILLARY
Glucose-Capillary: 100 mg/dL — ABNORMAL HIGH (ref 70–99)
Glucose-Capillary: 162 mg/dL — ABNORMAL HIGH (ref 70–99)
Glucose-Capillary: 148 mg/dL — ABNORMAL HIGH (ref 70–99)
Glucose-Capillary: 157 mg/dL — ABNORMAL HIGH (ref 70–99)

## 2018-10-15 LAB — CBC
HCT: 27.9 % — ABNORMAL LOW (ref 39.0–52.0)
Hemoglobin: 9 g/dL — ABNORMAL LOW (ref 13.0–17.0)
MCH: 31.9 pg (ref 26.0–34.0)
MCHC: 32.3 g/dL (ref 30.0–36.0)
MCV: 98.9 fL (ref 80.0–100.0)
Platelets: 182 10*3/uL (ref 150–400)
RBC: 2.82 MIL/uL — ABNORMAL LOW (ref 4.22–5.81)
RDW: 12.6 % (ref 11.5–15.5)
WBC: 10.5 10*3/uL (ref 4.0–10.5)
nRBC: 0 % (ref 0.0–0.2)

## 2018-10-15 NOTE — NC FL2 (Signed)
[place] LEVEL OF CARE SCREENING TOOL     IDENTIFICATION  Patient Name: Tyler [name] Birthdate: 1928-10-20 Sex: male Admission Date (Current Location): 10/12/2018  [facility] and [place] Number:  Herbalist and Address:  [facility],  [address], [place]      Provider Number: [id number]  Attending Physician Name and Address:  Gaynelle Arabian, MD  Relative Name and Phone Number:  Kieron, [place] [phone]  [phone]    Current Level of Care: Hospital Recommended Level of Care: [place] Prior Approval Number:    Date Approved/Denied:   PASRR Number: [id number]  Discharge Plan: SNF    Current Diagnoses: Patient Active Problem List   Diagnosis Date Noted  . Fracture of femoral neck, left, closed ([place]) 10/12/2018  . Closed nondisplaced fracture of pelvis ([place]) 08/22/2018  . Labile blood pressure   . Panic attack   . Anxiety state   . Labile blood glucose   . Benign essential HTN   . Stage 3 chronic kidney disease ([place])   . Dizziness and giddiness   . Numbness   . Acute blood loss anemia   . AKI (acute kidney injury) ([place])   . Diabetes mellitus type 2 in nonobese (HCC)   . Pelvic fracture ([place]) 06/15/2018  . Pain in joint involving right pelvic region and thigh   . Thrombocytopenia ([place])   . Diabetes mellitus type 2 in obese ([place])   . Anemia of chronic disease   . Type II diabetes mellitus with renal manifestations ([place]) 06/12/2018  . Fall 06/12/2018  . Closed fracture of pubic ramus, right, initial encounter ([place]) 06/12/2018  . Leukocytosis 06/12/2018  . Closed fracture of right pubis, initial encounter ([place]) 06/12/2018  . TIA (transient ischemic attack) 06/28/2016  . CKD (chronic kidney disease), stage III ([place]) 06/28/2016  . Microscopic hematuria 06/28/2016  . Femur fracture, right ([place]) 11/16/2015  . History of nephrolithiasis 11/16/2015  . Pseudobulbar affect 06/07/2014  .  Essential hypertension   . Memory loss   . Speech abnormality 01/28/2012  . Cerebral embolism with cerebral infarction ([place]) 01/28/2012  . History of stroke 01/28/2012  . Type 2 diabetes mellitus without complication, without long-term current use of insulin ([place]) 01/28/2012  . Hyperlipidemia 01/28/2012  . History of prostate cancer 01/28/2012    Orientation RESPIRATION BLADDER Height & Weight     Self, Time, Situation, Place  Normal Incontinent Weight: 155 lb (70.3 kg) Height:  5\' 10"  (177.8 cm)  BEHAVIORAL SYMPTOMS/MOOD NEUROLOGICAL BOWEL NUTRITION STATUS      Continent Diet(Carb Modified)  AMBULATORY STATUS COMMUNICATION OF NEEDS Skin   Limited Assist Verbally Surgical wounds                       Personal Care Assistance Level of Assistance  Bathing, Feeding, Dressing Bathing Assistance: Limited assistance Feeding assistance: Limited assistance Dressing Assistance: Limited assistance     Functional Limitations Info  Sight, Speech, Hearing Sight Info: Adequate Hearing Info: Adequate Speech Info: Adequate    SPECIAL CARE FACTORS FREQUENCY  PT (By licensed PT), OT (By licensed OT)     PT Frequency: Minimum 5x a week OT Frequency: Minimum 5x a week            Contractures Contractures Info: Not present    Additional Factors Info  Code Status, Allergies, Insulin Sliding Scale, Psychotropic Code Status Info: Full Code Allergies Info: Sulfa Drugs Cross Reactors Psychotropic Info: sertraline (ZOLOFT)  tablet 100 mg Insulin Sliding Scale Info: insulin aspart (novoLOG) injection 0-15 Units 3x a day with meals       Current Medications (10/15/2018):  This is the current hospital active medication list Current Facility-Administered Medications  Medication Dose Route Frequency Provider Last Rate Last Dose  . 0.9 %  sodium chloride infusion   Intravenous Continuous Gaynelle Arabian, MD   Stopped at 10/13/18 0830  . amLODipine (NORVASC) tablet 2.5 mg  2.5 mg Oral QHS  Gaynelle Arabian, MD   2.5 mg at 10/14/18 0205  . aspirin EC tablet 325 mg  325 mg Oral Q breakfast Gaynelle Arabian, MD   325 mg at 10/15/18 0747  . atorvastatin (LIPITOR) tablet 20 mg  20 mg Oral QHS Gaynelle Arabian, MD   20 mg at 10/14/18 2127  . clopidogrel (PLAVIX) tablet 75 mg  75 mg Oral Q breakfast Gaynelle Arabian, MD   75 mg at 10/15/18 0747  . docusate sodium (COLACE) capsule 100 mg  100 mg Oral BID Gaynelle Arabian, MD   100 mg at 10/15/18 0859  . glimepiride (AMARYL) tablet 1 mg  1 mg Oral Q breakfast Gaynelle Arabian, MD   1 mg at 10/15/18 0747  . HYDROcodone-acetaminophen (NORCO/VICODIN) 5-325 MG per tablet 1 tablet  1 tablet Oral Q6H PRN Gaynelle Arabian, MD   1 tablet at 10/14/18 2127  . insulin aspart (novoLOG) injection 0-15 Units  0-15 Units Subcutaneous TID WC Gaynelle Arabian, MD   3 Units at 10/15/18 1147  . losartan (COZAAR) tablet 50 mg  50 mg Oral QPM Gaynelle Arabian, MD   50 mg at 10/14/18 1721  . menthol-cetylpyridinium (CEPACOL) lozenge 3 mg  1 lozenge Oral PRN Aluisio, Pilar Plate, MD       Or  . phenol (CHLORASEPTIC) mouth spray 1 spray  1 spray Mouth/Throat PRN Aluisio, Pilar Plate, MD      . methocarbamol (ROBAXIN) tablet 500 mg  500 mg Oral Q6H PRN Gaynelle Arabian, MD   500 mg at 10/15/18 0751   Or  . methocarbamol (ROBAXIN) 500 mg in dextrose 5 % 50 mL IVPB  500 mg Intravenous Q6H PRN Gaynelle Arabian, MD   Stopped at 10/12/18 1857  . metoCLOPramide (REGLAN) tablet 5-10 mg  5-10 mg Oral Q8H PRN Aluisio, Pilar Plate, MD       Or  . metoCLOPramide (REGLAN) injection 5-10 mg  5-10 mg Intravenous Q8H PRN Aluisio, Pilar Plate, MD      . morphine 2 MG/ML injection 1 mg  1 mg Intravenous Q2H PRN Gaynelle Arabian, MD   1 mg at 10/13/18 0301  . ondansetron (ZOFRAN) tablet 4 mg  4 mg Oral Q6H PRN Aluisio, Pilar Plate, MD       Or  . ondansetron (ZOFRAN) injection 4 mg  4 mg Intravenous Q6H PRN Gaynelle Arabian, MD   4 mg at 10/13/18 0301  . pantoprazole (PROTONIX) EC tablet 40 mg  40 mg Oral Daily Gaynelle Arabian, MD   40  mg at 10/15/18 0859  . sertraline (ZOLOFT) tablet 100 mg  100 mg Oral Daily Gaynelle Arabian, MD   100 mg at 10/15/18 0857  . traMADol (ULTRAM) tablet 50 mg  50 mg Oral Q8H PRN Gaynelle Arabian, MD   50 mg at 10/13/18 1227     Discharge Medications: Please see discharge summary for a list of discharge medications.  Relevant Imaging Results:  Relevant Lab Results:   Additional Information SSN [id number]  Ross Ludwig, LCSW

## 2018-10-15 NOTE — Progress Notes (Signed)
Tyler [name]  MRN: [id number] DOB/Age: 1929-04-13 85 y.o. Physician: Ander Slade, M.D. 3 Days Post-Op Procedure(s) (LRB): Left hip pinning (Left)  Subjective: Patient sitting in bed this morning stating "I cannot move, I cannot get up".  Denies pain.  No complaints of shortness of breath.  Did eat some breakfast this morning.  Positive bowel movement Vital Signs Temp:  [98 F (36.7 C)-98.5 F (36.9 C)] 98.5 F (36.9 C) (06/13 0551) Pulse Rate:  [71-95] 71 (06/13 0551) Resp:  [16-18] 18 (06/13 0551) BP: (144-152)/(62-73) 152/62 (06/13 0551) SpO2:  [98 %-100 %] 100 % (06/13 0551)  Lab Results Recent Labs    10/14/18 0448 10/15/18 0249  WBC 8.6 10.5  HGB 9.3* 9.0*  HCT 29.3* 27.9*  PLT 207 182   BMET Recent Labs    10/13/18 0513 10/14/18 0448  NA 136 135  K 4.8 4.3  CL 105 104  CO2 22 22  GLUCOSE 242* 122*  BUN 32* 32*  CREATININE 1.36* 1.38*  CALCIUM 9.0 9.0   INR  Date Value Ref Range Status  06/13/2018 1.17  Final    Comment:    Performed at [facility], [place] [address], [place], [place] [address]     Exam  Left hip incision demonstrates dressing to be intact with no drainage.  Thigh is soft.  Poor effort with request for attempts at leg elevation very limited bed mobility.  Plan Patient demonstrating very slow progress with therapy.  Discussed with nursing staff patient's progress.  Nursing staff does report that they had a conversation with the patient's wife who was having difficulties with his overall care prior to his fall.  Patient is significantly concerned with his recovery and his ability to go home.  At this time will request case management for anticipated skilled nursing facility [facility] M Lejuan Botto 10/15/2018, 9:33 AM    Contact # [phone]

## 2018-10-15 NOTE — Progress Notes (Signed)
PT Note: Noted MD consulting SW for SNF placement.  Agree that SNF is best option for pt at this time due to slow progress and his high fall risk level. Will be changing his frequency to 3x/week. Maebelle Munroe. Tamala Julian, [place] Pager [phone] 10/15/2018

## 2018-10-15 NOTE — TOC Progression Note (Signed)
Transition of Care [facility]) - Progression Note    Patient Details  Name: Tyler [name] MRN: [id number] Date of Birth: 08-Dec-1928  Transition of Care [facility]) CM/SW [place], LCSW Phone Number: 10/15/2018, 3:53 PM  Clinical Narrative:    Plan: SNF CSW reached out to the patient spouse and son. CSW explain SNF process and will follow up with offers.  CSW faxed clinicals to SNF's in [place] and [place]. Patient family prefers [facility], if availble.   Bed offers pending.  FL2 complete.  HTA Auth. Initiated.    Expected Discharge Plan: [place] Barriers to Discharge: Continued Medical Work up  Expected Discharge Plan and Services Expected Discharge Plan: [place]   Discharge Planning Services: CM Consult Post Acute Care Choice: [place] arrangements for the past 2 months: Single Family Home Expected Discharge Date: 10/13/18               DME Arranged: N/A DME Agency: NA       HH Arranged: PT [place] Agency: [place] ([facility]) Date [place]: 10/15/18 Time [place]: 1221 Representative spoke with at [place]: [place] ([place]) Interventions    Readmission Risk Interventions No flowsheet data found.

## 2018-10-15 NOTE — Progress Notes (Signed)
    Home health agencies that serve [phone].        [place] Quality of Patient Care Rating Patient Survey Summary Rating  [facility] [phone] 4 out of 5 stars 4 out of [place] [phone] 3 out of 5 stars 5 out of [place] [phone] 3 out of 5 stars 4 out of [place] [phone] 4  out of 5 stars 3 out of [place] [phone] 4 out of 5 stars 4 out of [place] [phone] 4 out of 5 stars 4 out of 5 stars  [place] [phone] 4 out of 5 stars 4 out of 5 stars  [facility] [place] [phone] 3  out of 5 stars 4 out of [place] [phone] 3 out of 5 stars 4 out of [place] [phone] 3  out of 5 stars 3 out of [place] [phone] 3  out of 5 stars 4 out of [place] [phone] 3  out of 5 stars 3 out of [place] [phone] 4  out of 5 stars 3 out of [place] [phone] 4  out of 5 stars 2 out of [place] number Footnote as displayed on [place]  1 This agency provides services under a federal waiver program to non-traditional, chronic long term population.  2 This agency provides services to a special needs population.  3 Not Available.  4 The number of patient episodes for this measure is too small to report.  5 This measure currently does not have data or provider has been certified/recertified for less than 6 months.  6 The national average for this measure is not provided because of state-to-state differences in data collection.  7 Medicare is not displaying rates for this measure for any  home health agency, because of an issue with the data.  8 There were problems with the data and they are being corrected.  9 Zero, or very few, patients met the survey's rules for inclusion. The scores shown, if any, reflect a very small number of surveys and may not accurately tell how an agency is doing.  10 Survey results are based on less than 12 months of data.  11 Fewer than 70 patients completed the survey. Use the scores shown, if any, with caution as the number of surveys may be too low to accurately tell how an agency is doing.  12 No survey results are available for this period.  13 Data suppressed by CMS for one or more quarters.

## 2018-10-15 NOTE — TOC Initial Note (Signed)
Transition of Care [facility]) - Initial/Assessment Note    Patient Details  Name: Tyler [name] MRN: [id number] Date of Birth: 1928/11/06  Transition of Care (TOC) CM/SW Contact:    Joaquin Courts, RN Phone Number: 10/15/2018, 12:22 PM  Clinical Narrative:     CM spoke to patient at bedside and spouse via telephone. Patient set up with [facility]) for HHPT. Spouse reports that patient has rolling walker and 3-in-1 at home.               Expected Discharge Plan: [place] Barriers to Discharge: Continued Medical Work up   Patient Goals and CMS Choice Patient states their goals for this hospitalization and ongoing recovery are:: to get better CMS Medicare.gov Compare Post Acute Care list provided to:: Patient(wife) Choice offered to / list presented to : Patient, Spouse  Expected Discharge Plan and Services Expected Discharge Plan: [place]   Discharge Planning Services: CM Consult Post Acute Care Choice: [place] arrangements for the past 2 months: Single Family Home Expected Discharge Date: 10/13/18               DME Arranged: N/A DME Agency: NA       HH Arranged: PT [place] Agency: [place] ([place]) Date [place]: 10/15/18 Time [place]: 1221 Representative spoke with at [place]: Corene Cornea  Prior Living Arrangements/Services Living arrangements for the past 2 months: [place] Lives with:: Spouse Patient language and need for interpreter reviewed:: Yes Do you feel safe going back to the place where you live?: Yes      Need for Family Participation in Patient Care: Yes (Comment) Care giver support system in place?: Yes (comment)   Criminal Activity/Legal Involvement Pertinent to Current Situation/Hospitalization: No - Comment as needed  Activities of Daily Living Home Assistive Devices/Equipment: Walker (specify type) ADL Screening (condition at time of  admission) Patient's cognitive ability adequate to safely complete daily activities?: Yes Is the patient deaf or have difficulty hearing?: Yes Does the patient have difficulty seeing, even when wearing glasses/contacts?: No Does the patient have difficulty concentrating, remembering, or making decisions?: No Patient able to express need for assistance with ADLs?: Yes Does the patient have difficulty dressing or bathing?: No Independently performs ADLs?: Yes (appropriate for developmental age) Communication: Independent Is this a change from baseline?: Pre-admission baseline Dressing (OT): Independent Is this a change from baseline?: Pre-admission baseline Grooming: Independent Is this a change from baseline?: Pre-admission baseline Feeding: Independent Bathing: Independent Is this a change from baseline?: Pre-admission baseline Toileting: Independent Is this a change from baseline?: Pre-admission baseline In/Out Bed: Needs assistance Is this a change from baseline?: Pre-admission baseline Walks in Home: Independent with device (comment) Does the patient have difficulty walking or climbing stairs?: Yes Weakness of Legs: Both Weakness of Arms/Hands: None  Permission Sought/Granted Permission sought to share information with : Family Supports Permission granted to share information with : Yes, Verbal Permission Granted  Share Information with NAME: [name]     Permission granted to share info w Relationship: spouse     Emotional Assessment Appearance:: Appears stated age Attitude/Demeanor/Rapport: Engaged Affect (typically observed): Accepting Orientation: : Oriented to Self, Oriented to Place, Oriented to  Time, Oriented to Situation   Psych Involvement: No (comment)  Admission diagnosis:  left femoral neck fracture Patient Active Problem List   Diagnosis Date Noted  . Fracture of femoral neck, left, closed ([place]) 10/12/2018  . Closed  nondisplaced fracture of pelvis ([place])  08/22/2018  . Labile blood pressure   . Panic attack   . Anxiety state   . Labile blood glucose   . Benign essential HTN   . Stage 3 chronic kidney disease ([place])   . Dizziness and giddiness   . Numbness   . Acute blood loss anemia   . AKI (acute kidney injury) ([place])   . Diabetes mellitus type 2 in nonobese (HCC)   . Pelvic fracture ([place]) 06/15/2018  . Pain in joint involving right pelvic region and thigh   . Thrombocytopenia ([place])   . Diabetes mellitus type 2 in obese ([place])   . Anemia of chronic disease   . Type II diabetes mellitus with renal manifestations ([place]) 06/12/2018  . Fall 06/12/2018  . Closed fracture of pubic ramus, right, initial encounter ([place]) 06/12/2018  . Leukocytosis 06/12/2018  . Closed fracture of right pubis, initial encounter ([place]) 06/12/2018  . TIA (transient ischemic attack) 06/28/2016  . CKD (chronic kidney disease), stage III ([place]) 06/28/2016  . Microscopic hematuria 06/28/2016  . Femur fracture, right ([place]) 11/16/2015  . History of nephrolithiasis 11/16/2015  . Pseudobulbar affect 06/07/2014  . Essential hypertension   . Memory loss   . Speech abnormality 01/28/2012  . Cerebral embolism with cerebral infarction ([place]) 01/28/2012  . History of stroke 01/28/2012  . Type 2 diabetes mellitus without complication, without long-term current use of insulin ([place]) 01/28/2012  . Hyperlipidemia 01/28/2012  . History of prostate cancer 01/28/2012   PCP:  [name], MD Pharmacy:   [facility], [place] [address] Phone: [phone] Fax: [phone]     Social Determinants of Health (SDOH) Interventions    Readmission Risk Interventions No flowsheet data found.

## 2018-10-16 LAB — GLUCOSE, CAPILLARY
Glucose-Capillary: 99 mg/dL (ref 70–99)
Glucose-Capillary: 226 mg/dL — ABNORMAL HIGH (ref 70–99)
Glucose-Capillary: 152 mg/dL — ABNORMAL HIGH (ref 70–99)
Glucose-Capillary: 150 mg/dL — ABNORMAL HIGH (ref 70–99)

## 2018-10-16 MED ORDER — FLEET ENEMA 7-19 GM/118ML RE ENEM: 1.0000 | ENEMA | Freq: Every day | RECTAL | Status: DC | PRN

## 2018-10-16 MED ORDER — FLEET ENEMA 7-19 GM/118ML RE ENEM: 1.0000 | ENEMA | Freq: Once | RECTAL | Status: DC

## 2018-10-16 NOTE — Progress Notes (Signed)
Subjective: 4 Days Post-Op Procedure(s) (LRB): Left hip pinning (Left) Patient reports pain as moderate.   Patient sitting in the recliner this morning, stating " I feel awful." Patient denies pain in his left hip at rest. Per nursing, his wife suggested giving Zoloft earlier in the day which has improved his anxiety. He had issues with constipation earlier in the week, and had a fleets enema on Friday which resulted in a small bowel movement. Foley catheter in place.   Objective: Vital signs in last 24 hours: Temp:  [97.9 F (36.6 C)-98.2 F (36.8 C)] 97.9 F (36.6 C) (06/14 0532) Pulse Rate:  [77-79] 77 (06/14 0532) Resp:  [14-18] 18 (06/14 0532) BP: (128-160)/(65-77) 147/77 (06/14 0532) SpO2:  [99 %-100 %] 99 % (06/14 0532)  Intake/Output from previous day: 06/13 0701 - 06/14 0700 In: 460 [P.O.:460] Out: 1200 [Urine:1200] Intake/Output this shift: Total I/O In: 240 [P.O.:240] Out: 175 [Urine:175]  Recent Labs    10/14/18 0448 10/15/18 0249  HGB 9.3* 9.0*   Recent Labs    10/14/18 0448 10/15/18 0249  WBC 8.6 10.5  RBC 2.94* 2.82*  HCT 29.3* 27.9*  PLT 207 182   Recent Labs    10/14/18 0448  NA 135  K 4.3  CL 104  CO2 22  BUN 32*  CREATININE 1.38*  GLUCOSE 122*  CALCIUM 9.0   No results for input(s): LABPT, INR in the last 72 hours.  Neurologically intact Sensation intact distally Intact pulses distally Dorsiflexion/Plantar flexion intact  Left hip dressing C/D/I.   Assessment/Plan: 4 Days Post-Op Procedure(s) (LRB): Left hip pinning (Left)  Plan for discharge to SNF pending placement. Patient's anxiety seems to be improved today. Patient has had difficulty with constipation, he had a small bowel movement after enema on Friday. We have ordered PRN fleet enema in case this is needed again. WBAT to the LLE.    Griffith Citron, PA-C Orthopedic Surgery [facility] [place]

## 2018-10-17 LAB — GLUCOSE, CAPILLARY
Glucose-Capillary: 113 mg/dL — ABNORMAL HIGH (ref 70–99)
Glucose-Capillary: 200 mg/dL — ABNORMAL HIGH (ref 70–99)
Glucose-Capillary: 101 mg/dL — ABNORMAL HIGH (ref 70–99)
Glucose-Capillary: 144 mg/dL — ABNORMAL HIGH (ref 70–99)

## 2018-10-17 NOTE — TOC Progression Note (Addendum)
Transition of Care [facility]) - Progression Note    Patient Details  Name: Tyler [name] MRN: [id number] Date of Birth: June 20, 1928  Transition of Care [facility]) CM/SW Contact  Leeroy Cha, RN Phone Number: 10/17/2018, 10:11 AM  Clinical NarrativeTHN:    Auth Number from [facility] is [id number]. For [facility] 1016-tct-Tracy Ruthann Cancer at [facility] name, dob and Josem Kaufmann given for possible transfer to [facility]. She wcb. 1425/tct-son Hershel Corkery have a liost of aCCEPTING SNF TO REVIEW WITH SON.  WCB 1245/TCF-Son willnot make a decision on the list OF ACCEPTING SNF'S UNTIL HE Can talk to [place]. Instructed him to call office and have the MD call. Did inform him that patient has been discharged and  Medicare may not pay for additional day. 1540/spoke with son Temitayo Covalt not consider another provider outside of [facility] has no beds today/ 1615-tcf-Barbara Renato Battles -wife/ will accept which ever one has has a bed/ 1630-phone lines down unable to call out. Expected Discharge Plan: [place] Barriers to Discharge: Continued Medical Work up  Expected Discharge Plan and Services Expected Discharge Plan: [place]   Discharge Planning Services: CM Consult Post Acute Care Choice: [place] arrangements for the past 2 months: Single Family Home Expected Discharge Date: 10/17/18               DME Arranged: N/A DME Agency: NA       HH Arranged: PT [place] Agency: [place] ([place]) Date [place]: 10/15/18 Time [place]: 1221 Representative spoke with at [place]: [place] ([place]) Interventions    Readmission Risk Interventions No flowsheet data found.

## 2018-10-17 NOTE — Care Management Important Message (Signed)
Important Message  Patient Details IM Letter given to Velva Harman RN to present to the Patient Name: Tyler [name] MRN: [id number] Date of Birth: March 20, 1929   Medicare Important Message Given:  Yes    Kerin Salen 10/17/2018, 1:02 PM

## 2018-10-17 NOTE — Progress Notes (Signed)
   Subjective: 5 Days Post-Op Procedure(s) (LRB): Left hip pinning (Left) Patient reports pain as mild.   Remains very anxious Plan is to go to SNF after hospital stay.  Objective: Vital signs in last 24 hours: Temp:  [98 F (36.7 C)-98.4 F (36.9 C)] 98.4 F (36.9 C) (06/15 0500) Pulse Rate:  [76-86] 78 (06/15 0500) Resp:  [14-18] 17 (06/15 0500) BP: (130-162)/(64-68) 162/68 (06/15 0500) SpO2:  [99 %-100 %] 99 % (06/15 0500)  Intake/Output from previous day:  Intake/Output Summary (Last 24 hours) at 10/17/2018 0820 Last data filed at 10/17/2018 0801 Gross per 24 hour  Intake 660 ml  Output 425 ml  Net 235 ml    Intake/Output this shift: Total I/O In: 120 [P.O.:120] Out: -   Labs: Recent Labs    10/15/18 0249  HGB 9.0*   Recent Labs    10/15/18 0249  WBC 10.5  RBC 2.82*  HCT 27.9*  PLT 182   No results for input(s): NA, K, CL, CO2, BUN, CREATININE, GLUCOSE, CALCIUM in the last 72 hours. No results for input(s): LABPT, INR in the last 72 hours.  EXAM General - Patient is Alert and Appropriate Extremity - Neurovascular intact Incision: dressing C/D/I No cellulitis present Compartment soft Dressing/Incision - clean, dry, no drainage Motor Function - intact, moving foot and toes well on exam.   Past Medical History:  Diagnosis Date  . Cancer [facility])    Prostate  . Diabetes mellitus without complication ([place])   . Hypertension   . Kidney stones   . Stroke [facility])     Assessment/Plan: 5 Days Post-Op Procedure(s) (LRB): Left hip pinning (Left) Active Problems:   Fracture of femoral neck, left, closed ([place])   Up with therapy  Discharge to SNF once bed available  Weight Bearing As Tolerated left Leg  Tyler [name] 10/17/2018, 8:20 AM

## 2018-10-17 NOTE — Progress Notes (Signed)
Physical Therapy Treatment Patient Details Name: Tyler [name] MRN: [id number] DOB: 08/26/1928 Today's Date: 10/17/2018    History of Present Illness L IM Nail Hx Prostate CA, HTN    PT Comments    POD #5 Pt OOB in recliner.  Assisted with amb in hallway.  General Gait Details: 50% VC's on proper walker to self distance and safety with turns.  Very unsteady with poor self correction to midline.  HIGH FALL RISK.  General transfer comment: 50% VC's on safety with turns and hand placement with stand to sit.  Very unsteady.  Incomplete pivot to [facility].  Poor self righting to mideline. Assisted back to bed.  General bed mobility comments: Max Assist to support B LE's up onto bed and scoot to [facility] to position. Spouse unable to assist at current level.  Pt will need ST Rehab at SNF.   Follow Up Recommendations  SNF     Equipment Recommendations  None recommended by PT    Recommendations for Other Services       Precautions / Restrictions Precautions Precautions: None Restrictions Weight Bearing Restrictions: No Other Position/Activity Restrictions: WBAT    Mobility  Bed Mobility Overal bed mobility: Needs Assistance Bed Mobility: Sit to Supine       Sit to supine: Max assist   General bed mobility comments: Max Assist to support B LE's up onto bed and scoot to [facility] to position  Transfers Overall transfer level: Needs assistance Equipment used: Rolling walker (2 wheeled) Transfers: Sit to/from [facility] Sit to Stand: Min assist;Mod assist Stand pivot transfers: Mod assist;Max assist       General transfer comment: 50% VC's on safety with turns and hand placement with stand to sit.  Very unsteady.  Incomplete pivot to [facility].  Poor self righting to mideline.  Ambulation/Gait Ambulation/Gait assistance: Mod assist;+2 physical assistance;+2 safety/equipment Gait Distance (Feet): 22 Feet   Gait Pattern/deviations: Step-to pattern;Step-through pattern;Decreased  stance time - left Gait velocity: decreased   General Gait Details: 50% VC's on proper walker to self distance and safety with turns.  Very unsteady with poor self correction to midline.  HIGH FALL RISK.   Stairs             Wheelchair Mobility    Modified Rankin (Stroke Patients Only)       Balance                                            Cognition Arousal/Alertness: Awake/alert                                     General Comments: required repeat safety cueing(hand placement, turn completion, walker use)  Impaired self awareness      Exercises      General Comments        Pertinent Vitals/Pain Pain Assessment: Faces Faces Pain Scale: Hurts little more Pain Location: L hip with increased mobility Pain Descriptors / Indicators: Grimacing Pain Intervention(s): Monitored during session;Repositioned;Ice applied    Home Living                      Prior Function            PT Goals (current goals can now be found in the care plan section)  Frequency    Min 3X/week      PT Plan Current plan remains appropriate    Co-evaluation              AM-PAC PT "6 Clicks" Mobility   Outcome Measure  Help needed turning from your back to your side while in a flat bed without using bedrails?: A Little Help needed moving from lying on your back to sitting on the side of a flat bed without using bedrails?: A Little Help needed moving to and from a bed to a chair (including a wheelchair)?: A Lot Help needed standing up from a chair using your arms (e.g., wheelchair or bedside chair)?: A Lot Help needed to walk in hospital room?: A Lot Help needed climbing 3-5 steps with a railing? : Total 6 Click Score: 13    End of Session Equipment Utilized During Treatment: Gait belt Activity Tolerance: Patient tolerated treatment well Patient left: in bed;with call bell/phone within reach;with bed alarm set Nurse  Communication: Mobility status PT Visit Diagnosis: Other abnormalities of gait and mobility (R26.89)     Time: 2395-3202 PT Time Calculation (min) (ACUTE ONLY): 27 min  Charges:   1 gt     1 ta                     Rica Koyanagi  PTA Acute  Rehabilitation Services Pager      [phone] Office      [phone]

## 2018-10-18 ENCOUNTER — Inpatient Hospital Stay (HOSPITAL_COMMUNITY): Payer: PPO

## 2018-10-18 DIAGNOSIS — S72002D Fracture of unspecified part of neck of left femur, subsequent encounter for closed fracture with routine healing: Secondary | ICD-10-CM

## 2018-10-18 LAB — GLUCOSE, CAPILLARY
Glucose-Capillary: 120 mg/dL — ABNORMAL HIGH (ref 70–99)
Glucose-Capillary: 109 mg/dL — ABNORMAL HIGH (ref 70–99)
Glucose-Capillary: 183 mg/dL — ABNORMAL HIGH (ref 70–99)
Glucose-Capillary: 110 mg/dL — ABNORMAL HIGH (ref 70–99)
Glucose-Capillary: 151 mg/dL — ABNORMAL HIGH (ref 70–99)

## 2018-10-18 LAB — CBC
HCT: 32.6 % — ABNORMAL LOW (ref 39.0–52.0)
Hemoglobin: 10.3 g/dL — ABNORMAL LOW (ref 13.0–17.0)
MCH: 32.2 pg (ref 26.0–34.0)
MCHC: 31.6 g/dL (ref 30.0–36.0)
MCV: 101.9 fL — ABNORMAL HIGH (ref 80.0–100.0)
Platelets: 238 10*3/uL (ref 150–400)
RBC: 3.2 MIL/uL — ABNORMAL LOW (ref 4.22–5.81)
RDW: 12.8 % (ref 11.5–15.5)
WBC: 7.7 10*3/uL (ref 4.0–10.5)
nRBC: 0 % (ref 0.0–0.2)

## 2018-10-18 LAB — BASIC METABOLIC PANEL
Anion gap: 13 (ref 5–15)
BUN: 37 mg/dL — ABNORMAL HIGH (ref 8–23)
CO2: 21 mmol/L — ABNORMAL LOW (ref 22–32)
Calcium: 9.4 mg/dL (ref 8.9–10.3)
Chloride: 102 mmol/L (ref 98–111)
Creatinine, Ser: 1.39 mg/dL — ABNORMAL HIGH (ref 0.61–1.24)
GFR calc Af Amer: 52 mL/min — ABNORMAL LOW (ref >60)
GFR calc non Af Amer: 45 mL/min — ABNORMAL LOW (ref >60)
Glucose, Bld: 131 mg/dL — ABNORMAL HIGH (ref 70–99)
Potassium: 4.2 mmol/L (ref 3.5–5.1)
Sodium: 136 mmol/L (ref 135–145)

## 2018-10-18 MED ORDER — ACETAMINOPHEN 325 MG PO TABS
650.0000 mg | ORAL_TABLET | Freq: Four times a day (QID) | ORAL | Status: DC | PRN
  Administered 2018-10-18 – 2018-10-19 (×5): 650 mg via ORAL
  Filled 2018-10-18 (×5): qty 2

## 2018-10-18 MED ORDER — ASPIRIN 325 MG PO TBEC: 325.0000 mg | DELAYED_RELEASE_TABLET | Freq: Every day | ORAL | 0 refills | Status: AC

## 2018-10-18 MED ORDER — METHOCARBAMOL 500 MG PO TABS: 500.0000 mg | ORAL_TABLET | Freq: Four times a day (QID) | ORAL | 0 refills | Status: DC | PRN

## 2018-10-18 NOTE — Consult Note (Signed)
[facility] TeleNeurology Consult Services   Date of Service:   10/18/2018 06:28:43  Impression:     .  Rule Out Acute Ischemic Stroke  Comments/Sign-Out: 85 yo M h/o L hip surgery on 6/10 p/w abnormal movements and twitching today of R arm and lethargy. Pt received Norco last night. Pt is more responsive now. Has L facial droop at baseline. Cooperates with some of exam. On ASA.  Mechanism of Stroke: Not Clear  Metrics: Last Known Well: 10/18/2018 03:00:00 [facility] Notification Time: 10/18/2018 06:27:51 Stamp Time: 10/18/2018 99:83:38 Time First Login Attempt: 10/18/2018 06:34:44 Video Start Time: 10/18/2018 06:34:44  Symptoms: twitching, altered mental status NIHSS Start Assessment Time: 10/18/2018 06:36:55 Patient is not a candidate for tPA. Patient was not deemed candidate for tPA thrombolytics because of Resolved symptoms (no residual disabling symptoms).  CT head showed no acute hemorrhage or acute core infarct.  Clinical Presentation is not Suggestive of Large Vessel Occlusive Disease  Sign Out: Neurochecks, avoid narcotic pain meds if possible     .  Discussed with Rapid Response Team and primary attending    ------------------------------------------------------------------------------  History of Present Illness: Patient is a 85 year old Male.  Inpatient stroke alert was called for symptoms of twitching, altered mental status  85 yo M h/o L hip surgery on 6/10 p/w abnormal movements and twitching today of R arm and lethargy. Pt received Norco last night. Pt is more responsive now. Has L facial droop at baseline.  Last seen normal was within 4.5 hours. There is no history of recent major surgery.  Examination:  Pt not opening eyes to read language cards, has R hand high frequency coarse tremor like movements while arm is lifted, similar lower frequency movements with left hand while left arm lifted up. Pt able to answer orientation questions.   BP(178/67), 1A: Level of Consciousness - Alert; keenly responsive + 0 1B: Ask Month and Age - Both Questions Right + 0 1C: Blink Eyes & Squeeze Hands - Performs Both Tasks + 0 2: Test Horizontal Extraocular Movements - Normal + 0 3: Test Visual Fields - No Visual Loss + 0 4: Test Facial Palsy (Use Grimace if Obtunded) - Minor paralysis (flat nasolabial fold, smile asymmetry) + 1 5A: Test Left Arm Motor Drift - No Drift for 10 Seconds + 0 5B: Test Right Arm Motor Drift - No Drift for 10 Seconds + 0 6A: Test Left Leg Motor Drift - No Drift for 5 Seconds + 0 6B: Test Right Leg Motor Drift - No Drift for 5 Seconds + 0 7: Test Limb Ataxia (FNF/Heel-Shin) - No Ataxia + 0 8: Test Sensation - Normal; No sensory loss + 0 9: Test Language/Aphasia - Severe Aphasia: Fragmentary Expression, Inference Needed, Cannot Identify Materials + 2 10: Test Dysarthria - Normal + 0 11: Test Extinction/Inattention - No abnormality + 0  NIHSS Score: 3  Patient/Family was informed the Neurology Consult would happen via TeleHealth consult by way of interactive audio and video telecommunications and consented to receiving care in this manner.  Due to the immediate potential for life-threatening deterioration due to underlying acute neurologic illness, I spent 35 minutes providing critical care. This time includes time for face to face visit via telemedicine, review of medical records, imaging studies and discussion of findings with providers, the patient and/or family.   Dr Launa Grill   [facility] [phone]   Case [id number]

## 2018-10-18 NOTE — Progress Notes (Signed)
   Subjective: 6 Days Post-Op Procedure(s) (LRB): Left hip pinning (Left)  Patient seen in rounds by Dr. Wynelle Link. Has been slow to progress with physical therapy. Patient noted this AM to have lethargy, right arm twitching and abnormal movements. Teleneurology consult placed and head CT ordered. CT showed no acute hemorrhage or infarct. Neuro deemed no tPA. Symptoms have since resolved. Patient did receive norco last night, will discontinue. Will continue with tylenol only.   Objective: Vital signs in last 24 hours: Temp:  [97.8 F (36.6 C)-97.9 F (36.6 C)] 97.9 F (36.6 C) (06/15 2125) Pulse Rate:  [73-86] 79 (06/16 0650) Resp:  [17-20] 19 (06/16 0650) BP: (130-179)/(62-76) 150/74 (06/16 0650) SpO2:  [98 %-100 %] 100 % (06/16 0650)  Intake/Output from previous day:  Intake/Output Summary (Last 24 hours) at 10/18/2018 0744 Last data filed at 10/18/2018 0200 Gross per 24 hour  Intake 240 ml  Output 575 ml  Net -335 ml   Exam: General - Patient is Alert and Oriented Extremity - Neurologically intact Neurovascular intact Sensation intact distally Dorsiflexion/Plantar flexion intact Dressing/Incision - clean, dry, no drainage Motor Function - intact, moving foot and toes well on exam.   Past Medical History:  Diagnosis Date  . Cancer [facility])    Prostate  . Diabetes mellitus without complication ([place])   . Hypertension   . Kidney stones   . Stroke [facility])     Assessment/Plan: 6 Days Post-Op Procedure(s) (LRB): Left hip pinning (Left) Active Problems:   Fracture of femoral neck, left, closed ([place])  Estimated body mass index is 22.24 kg/m as calculated from the following:   Height as of this encounter: 5\' 10"  (1.778 m).   Weight as of this encounter: 70.3 kg. Up with therapy  DVT Prophylaxis - Aspirin Weight-bearing as tolerated  Will obtain medical consult. Plan is for discharge to SNF once deemed medically stable.   Theresa Duty, PA-C Orthopedic Surgery  10/18/2018, 7:44 AM

## 2018-10-18 NOTE — TOC Progression Note (Signed)
Transition of Care [facility]) - Progression Note    Patient Details  Name: Tyler [name] MRN: [id number] Date of Birth: 1929-02-20  Transition of Care [facility]) CM/SW Contact  Leeroy Cha, RN Phone Number: 10/18/2018, 8:52 AM  Clinical Narrative:    0815/TCF-[facility] will know if patient has a bed after 10 am meeting today/   Expected Discharge Plan: [place] Barriers to Discharge: SNF Pending bed offer  Expected Discharge Plan and Services Expected Discharge Plan: [place]   Discharge Planning Services: CM Consult Post Acute Care Choice: [place] arrangements for the past 2 months: Single Family Home Expected Discharge Date: 10/17/18               DME Arranged: N/A DME Agency: NA       HH Arranged: [place] Agency: [place] ([place]) Date HH Agency Contacted: 10/15/18 Time [place]: 1221 Representative spoke with at [place]: [place] ([place]) Interventions    Readmission Risk Interventions No flowsheet data found.

## 2018-10-18 NOTE — Consult Note (Addendum)
Medical Consultation   Tyler [name]  ZMO:[id number]  DOB: [birth year]  DOA: 10/12/2018  PCP: [name], MD Requesting physician:Dr.Alusio  Reason for consultation: Lethargy, decreased responsiveness, tremors  History of Present Illness: Tyler [name] is an 85 y.o. male with prior history of TIA and stroke with right facial weakness, CKD stage III, type 2 diabetes mellitus, hypertension, dyslipidemia was admitted by Dr.Alusio on 6/10 with left femur neck fracture, underwent left hip pinning has been slow to progress with therapy, plan was for rehab to SNF today. -Early this morning patient was noted to be lethargic, with some tremors, telemetry neurology was consulted who suspected that his symptoms was likely secondary to Flexeril and Vicodin which the patient got at 2:30 and 4:20 AM today, -Both these medications were discontinued by orthopedics -No fevers or chills, patient denies any nausea vomiting abdominal pain or diarrhea, patient reports poor p.o. intake, he also reports that his tremors are longstanding  Review of Systems:  ROS As per HPI otherwise 14 point review of systems negative.   Past Medical History: Past Medical History:  Diagnosis Date  . Cancer [facility])    Prostate  . Diabetes mellitus without complication ([place])   . Hypertension   . Kidney stones   . Stroke [facility])     Past Surgical History: Past Surgical History:  Procedure Laterality Date  . CARDIAC CATHETERIZATION    . cyst surgery on back     removed from end of spine 50 years ago  . FEMUR IM NAIL Right 11/17/2015   Procedure: INTRAMEDULLARY (IM) NAIL FEMORAL;  Surgeon: Marybelle Killings, MD;  Location: [place];  Service: Orthopedics;  Laterality: Right;  . HIP PINNING,CANNULATED Left 10/12/2018   Procedure: Left hip pinning;  Surgeon: Gaynelle Arabian, MD;  Location: [facility];  Service: Orthopedics;  Laterality: Left;  60min  . KNEE ARTHROSCOPY     both knees  . PROSTATE SURGERY    . SHOULDER  SURGERY     right shoulder     Allergies:   Allergies  Allergen Reactions  . Sulfa Drugs Cross Reactors Anaphylaxis and Rash    Lungs fill with fluid     Social History:  reports that he quit smoking about 50 years ago. He has never used smokeless tobacco. He reports that he does not drink alcohol or use drugs.   Family History: Family History  Problem Relation Age of Onset  . Emphysema Father   . Heart Problems Mother     Physical Exam: Vitals:   10/18/18 0640 10/18/18 0644 10/18/18 0650 10/18/18 0808  BP: (!) 155/72 (!) 178/67 (!) 150/74 (!) 166/72  Pulse: 86 83 79 83  Resp: 17 18 19    Temp:      TempSrc:      SpO2: 98% 100% 100%   Weight:      Height:        Constitutional: Elderly frail chronically ill male, awake alert oriented to self place and partly to time, laying in bed, no distress HEENT: Pupils are equal and reactive, oral mucosa is dry, right facial droop noted Neck: neck appears normal, no masses, normal ROM, no thyromegaly, [name]  CVS: S1-S2/regular rate rhythm Respiratory: Clear bilaterally Abdomen: soft nontender, nondistended, normal bowel sounds, no hepatosplenomegaly, no hernias  Musculoskeletal: : Left hip with dressing  neuro: Right facial droop, mild right psych: judgement and insight appear normal, stable mood and  affect, mental status Skin: no rashes or lesions or ulcers, no induration or nodules    Data reviewed:  I have personally reviewed following labs and imaging studies Labs:  CBC: Recent Labs  Lab 10/12/18 1526 10/13/18 0513 10/14/18 0448 10/15/18 0249 10/18/18 0825  WBC 7.4 10.5 8.6 10.5 7.7  HGB 12.3* 10.2* 9.3* 9.0* 10.3*  HCT 38.6* 32.4* 29.3* 27.9* 32.6*  MCV 99.7 100.0 99.7 98.9 101.9*  PLT 227 206 207 182 185    Basic Metabolic Panel: Recent Labs  Lab 10/12/18 1526 10/13/18 0513 10/14/18 0448 10/18/18 0825  NA 137 136 135 136  K 4.2 4.8 4.3 4.2  CL 107 105 104 102  CO2 21* 22 22 21*  GLUCOSE 128* 242*  122* 131*  BUN 30* 32* 32* 37*  CREATININE 1.36* 1.36* 1.38* 1.39*  CALCIUM 9.7 9.0 9.0 9.4   GFR Estimated Creatinine Clearance: 37.2 mL/min (A) (by C-G formula based on SCr of 1.39 mg/dL (H)). Liver Function Tests: No results for input(s): AST, ALT, ALKPHOS, BILITOT, PROT, ALBUMIN in the last 168 hours. No results for input(s): LIPASE, AMYLASE in the last 168 hours. No results for input(s): AMMONIA in the last 168 hours. Coagulation profile No results for input(s): INR, PROTIME in the last 168 hours.  Cardiac Enzymes: No results for input(s): CKTOTAL, CKMB, CKMBINDEX, TROPONINI in the last 168 hours. BNP: Invalid input(s): POCBNP CBG: Recent Labs  Lab 10/17/18 1117 10/17/18 1639 10/17/18 2223 10/18/18 0614 10/18/18 0722  GLUCAP 200* 101* 144* 120* 109*   D-Dimer No results for input(s): DDIMER in the last 72 hours. Hgb A1c No results for input(s): HGBA1C in the last 72 hours. Lipid Profile No results for input(s): CHOL, HDL, LDLCALC, TRIG, CHOLHDL, LDLDIRECT in the last 72 hours. Thyroid function studies No results for input(s): TSH, T4TOTAL, T3FREE, THYROIDAB in the last 72 hours.  Invalid input(s): FREET3 Anemia work up No results for input(s): VITAMINB12, FOLATE, FERRITIN, TIBC, IRON, RETICCTPCT in the last 72 hours. Urinalysis    Component Value Date/Time   COLORURINE YELLOW 06/20/2018 1013   APPEARANCEUR CLEAR 06/20/2018 1013   LABSPEC 1.015 06/20/2018 1013   PHURINE 5.0 06/20/2018 1013   GLUCOSEU NEGATIVE 06/20/2018 1013   HGBUR MODERATE (A) 06/20/2018 1013   BILIRUBINUR NEGATIVE 06/20/2018 1013   KETONESUR NEGATIVE 06/20/2018 1013   PROTEINUR NEGATIVE 06/20/2018 1013   UROBILINOGEN 1.0 03/13/2008 1130   NITRITE NEGATIVE 06/20/2018 [place] 06/20/2018 1013     Microbiology Recent Results (from the past 240 hour(s))  SARS Coronavirus 2 (CEPHEID - Performed in [place] hospital lab), Hosp Order     Status: None   Collection  Time: 10/12/18  2:14 PM   Specimen: Nasopharyngeal Swab  Result Value Ref Range Status   SARS Coronavirus 2 NEGATIVE NEGATIVE Final    Comment: (NOTE) If result is NEGATIVE SARS-CoV-2 target nucleic acids are NOT DETECTED. The SARS-CoV-2 RNA is generally detectable in upper and lower  respiratory specimens during the acute phase of infection. The lowest  concentration of SARS-CoV-2 viral copies this assay can detect is 250  copies / mL. A negative result does not preclude SARS-CoV-2 infection  and should not be used as the sole basis for treatment or other  patient management decisions.  A negative result may occur with  improper specimen collection / handling, submission of specimen other  than nasopharyngeal swab, presence of viral mutation(s) within the  areas targeted by this assay, and inadequate number of viral copies  (<  250 copies / mL). A negative result must be combined with clinical  observations, patient history, and epidemiological information. If result is POSITIVE SARS-CoV-2 target nucleic acids are DETECTED. The SARS-CoV-2 RNA is generally detectable in upper and lower  respiratory specimens dur ing the acute phase of infection.  Positive  results are indicative of active infection with SARS-CoV-2.  Clinical  correlation with patient history and other diagnostic information is  necessary to determine patient infection status.  Positive results do  not rule out bacterial infection or co-infection with other viruses. If result is PRESUMPTIVE POSTIVE SARS-CoV-2 nucleic acids MAY BE PRESENT.   A presumptive positive result was obtained on the submitted specimen  and confirmed on repeat testing.  While 2019 novel coronavirus  (SARS-CoV-2) nucleic acids may be present in the submitted sample  additional confirmatory testing may be necessary for epidemiological  and / or clinical management purposes  to differentiate between  SARS-CoV-2 and other Sarbecovirus currently known  to infect humans.  If clinically indicated additional testing with an alternate test  methodology [phone]) is advised. The SARS-CoV-2 RNA is generally  detectable in upper and lower respiratory sp ecimens during the acute  phase of infection. The expected result is Negative. Fact Sheet for Patients:  StrictlyIdeas.no Fact Sheet for Healthcare Providers: BankingDealers.co.za This test is not yet approved or cleared by the [place] FDA and has been authorized for detection and/or diagnosis of SARS-CoV-2 by FDA under an Emergency Use Authorization (EUA).  This EUA will remain in effect (meaning this test can be used) for the duration of the COVID-19 declaration under Section 564(b)(1) of the Act, 21 U.S.C. section 360bbb-3(b)(1), unless the authorization is terminated or revoked sooner. Performed at [facility], [place] [address] [place], [place] [address]        Inpatient Medications:   Scheduled Meds: . amLODipine  2.5 mg Oral QHS  . aspirin EC  325 mg Oral Q breakfast  . atorvastatin  20 mg Oral QHS  . clopidogrel  75 mg Oral Q breakfast  . docusate sodium  100 mg Oral BID  . glimepiride  1 mg Oral Q breakfast  . insulin aspart  0-15 Units Subcutaneous TID WC  . losartan  50 mg Oral QPM  . pantoprazole  40 mg Oral Daily  . sertraline  100 mg Oral Daily   Continuous Infusions: . sodium chloride Stopped (10/13/18 0830)     Radiological Exams on Admission: Ct Head Code Stroke Wo Contrast  Addendum Date: 10/18/2018   ADDENDUM REPORT: 10/18/2018 06:56 ADDENDUM: These results were called by telephone at the time of interpretation on 10/18/2018 at 6:53 am to [facility], [place], who verbally acknowledged these results. Electronically Signed   By: San Morelle M.D.   On: 10/18/2018 06:56   Result Date: 10/18/2018 CLINICAL DATA:  Code stroke. Sudden onset of weakness, malaise, and now nonverbal.  Symptoms noticed at 6 a.m. Last seen normal at 3 a.m. Left hip ORIF 10/12/2018 EXAM: CT HEAD WITHOUT CONTRAST TECHNIQUE: Contiguous axial images were obtained from the base of the skull through the vertex without intravenous contrast. COMPARISON:  CT head without contrast 06/20/2018 FINDINGS: Brain: Moderate atrophy and white matter disease is again seen. A remote lacunar infarct is present in the left corona radiata. Areas of hypoattenuation involving the thalami bilaterally are stable. The ventricles are proportionate to the degree of atrophy. The brainstem and cerebellum are within normal limits. No acute extra-axial fluid collection is present. Vascular: Atherosclerotic calcifications  are present within the cavernous internal carotid arteries bilaterally. There is no hyperdense vessel. Skull: Calvarium is intact. No focal lytic or blastic lesions are present. Sinuses/Orbits: The paranasal sinuses and mastoid air cells are clear. Bilateral lens replacements are noted. Globes and orbits are otherwise unremarkable. ASPECTS [facility] Stroke Program Early CT Score) - Ganglionic level infarction (caudate, lentiform nuclei, internal capsule, insula, M1-M3 cortex): 7/7 - Supraganglionic infarction (M4-M6 cortex): 3/3 Total score (0-10 with 10 being normal): 10/10 IMPRESSION: 1. No acute intracranial abnormality or significant interval change. 2. Stable moderate atrophy and diffuse white matter disease. This likely reflects the sequela of chronic microvascular ischemia. 3. Atherosclerosis 4. ASPECTS is 10/10 Electronically Signed: By: San Morelle M.D. On: 10/18/2018 06:46    Impression/Recommendations Active Problems:  Metabolic encephalopathy -Transient lethargy, tremors-this is medication induced given temporal association following administration of Robaxin and Vicodin,  -Mentation has improved to baseline, he is alert oriented to self place and partly to time -CT head is unremarkable -Will check CBC  and Bmet, he is afebrile and nontoxic -Clinical suspicion for acute CVA or seizures appears less likely at this time, however if he has recurrent symptoms would pursue MRI +/- EEG  Left hip fracture -Status post left hip pinning 6/10 by Dr. Maureen Ralphs  History of CVA -With chronic right facial droop and patient reports mild right-sided weakness also from his previous strokes, continue Plavix -CT head unremarkable  Type 2 diabetes mellitus -CBG stable, continue sliding scale insulin  CKD stage III -We will check labs   Thank you for this consultation.  Our [facility] hospitalist team will follow the patient with you.   Time Spent: 71min  Domenic Polite M.D. [facility] 10/18/2018, 9:05 AM

## 2018-10-18 NOTE — Progress Notes (Addendum)
Radiologist called results of CT head to this RN at [place]. Results had been read by Dr. Lyndel Safe, teleneurologist. Results reported off to Dr. Wynelle Link at 0700.

## 2018-10-18 NOTE — Significant Event (Signed)
Rapid Response Event Note  Overview: Time Called: 0539 Arrival Time: 0605 Event Type: Neurologic  Initial Focused Assessment: Patient experiencing new onset non-verbal. Twitching, right arm flapping. Patient appears lethargic, hard to arouse. Patient does arouse to sternal rub, mutters simple words and returns to sleep. Facial droop noted, but per Dr. Wynelle Link this is patient's baseline. No arm drift. Speech is clear.  NIH documented in flowsheet. NIH performed by Dr. Lyndel Safe noted in progress notes.   Interventions: Code stroke initiated.  CT head results reported to Dr. Lyndel Safe, no further intervention at this time. Dr. Wynelle Link at bedside to assess the patient.  Plan of Care (if not transferred): Patient mentation is slightly improving following code stroke assessment. Patient is now softly muttering "I want to go home." Patient will not open eyes to command and still appears intermittently drowsy. Patient received Norco at 0230 and Dr. Wynelle Link feels this may be a result of the pain medication. Dr. Wynelle Link aware of assessment and spoke to Dr. Lyndel Safe through the teleneurology cart.   RN to continue to monitor patient's mentation and ability to follow commands. RN to notify MD or rapid response if patient's mentation does not continue to improve to baseline.   Event Summary:  Tyler [name]

## 2018-10-19 DIAGNOSIS — R2689 Other abnormalities of gait and mobility: Secondary | ICD-10-CM | POA: Diagnosis not present

## 2018-10-19 DIAGNOSIS — K219 Gastro-esophageal reflux disease without esophagitis: Secondary | ICD-10-CM | POA: Diagnosis not present

## 2018-10-19 DIAGNOSIS — Z79899 Other long term (current) drug therapy: Secondary | ICD-10-CM | POA: Diagnosis not present

## 2018-10-19 DIAGNOSIS — G92 Toxic encephalopathy: Secondary | ICD-10-CM | POA: Diagnosis not present

## 2018-10-19 DIAGNOSIS — F411 Generalized anxiety disorder: Secondary | ICD-10-CM | POA: Diagnosis not present

## 2018-10-19 DIAGNOSIS — S3289XA Fracture of other parts of pelvis, initial encounter for closed fracture: Secondary | ICD-10-CM | POA: Diagnosis not present

## 2018-10-19 DIAGNOSIS — Z20828 Contact with and (suspected) exposure to other viral communicable diseases: Secondary | ICD-10-CM | POA: Diagnosis not present

## 2018-10-19 DIAGNOSIS — D519 Vitamin B12 deficiency anemia, unspecified: Secondary | ICD-10-CM | POA: Diagnosis not present

## 2018-10-19 DIAGNOSIS — I10 Essential (primary) hypertension: Secondary | ICD-10-CM | POA: Diagnosis not present

## 2018-10-19 DIAGNOSIS — S72002A Fracture of unspecified part of neck of left femur, initial encounter for closed fracture: Secondary | ICD-10-CM | POA: Diagnosis not present

## 2018-10-19 DIAGNOSIS — E1129 Type 2 diabetes mellitus with other diabetic kidney complication: Secondary | ICD-10-CM | POA: Diagnosis not present

## 2018-10-19 DIAGNOSIS — F331 Major depressive disorder, recurrent, moderate: Secondary | ICD-10-CM | POA: Diagnosis not present

## 2018-10-19 DIAGNOSIS — E785 Hyperlipidemia, unspecified: Secondary | ICD-10-CM | POA: Diagnosis not present

## 2018-10-19 DIAGNOSIS — R41841 Cognitive communication deficit: Secondary | ICD-10-CM | POA: Diagnosis not present

## 2018-10-19 DIAGNOSIS — R457 State of emotional shock and stress, unspecified: Secondary | ICD-10-CM | POA: Diagnosis not present

## 2018-10-19 DIAGNOSIS — R278 Other lack of coordination: Secondary | ICD-10-CM | POA: Diagnosis not present

## 2018-10-19 DIAGNOSIS — I69359 Hemiplegia and hemiparesis following cerebral infarction affecting unspecified side: Secondary | ICD-10-CM | POA: Diagnosis not present

## 2018-10-19 DIAGNOSIS — C61 Malignant neoplasm of prostate: Secondary | ICD-10-CM | POA: Diagnosis not present

## 2018-10-19 DIAGNOSIS — K5909 Other constipation: Secondary | ICD-10-CM | POA: Diagnosis not present

## 2018-10-19 DIAGNOSIS — S72002D Fracture of unspecified part of neck of left femur, subsequent encounter for closed fracture with routine healing: Secondary | ICD-10-CM | POA: Diagnosis not present

## 2018-10-19 DIAGNOSIS — E1151 Type 2 diabetes mellitus with diabetic peripheral angiopathy without gangrene: Secondary | ICD-10-CM | POA: Diagnosis not present

## 2018-10-19 DIAGNOSIS — M2550 Pain in unspecified joint: Secondary | ICD-10-CM | POA: Diagnosis not present

## 2018-10-19 DIAGNOSIS — E559 Vitamin D deficiency, unspecified: Secondary | ICD-10-CM | POA: Diagnosis not present

## 2018-10-19 DIAGNOSIS — R1312 Dysphagia, oropharyngeal phase: Secondary | ICD-10-CM | POA: Diagnosis not present

## 2018-10-19 DIAGNOSIS — N183 Chronic kidney disease, stage 3 (moderate): Secondary | ICD-10-CM | POA: Diagnosis not present

## 2018-10-19 DIAGNOSIS — N390 Urinary tract infection, site not specified: Secondary | ICD-10-CM | POA: Diagnosis not present

## 2018-10-19 DIAGNOSIS — S72092D Other fracture of head and neck of left femur, subsequent encounter for closed fracture with routine healing: Secondary | ICD-10-CM | POA: Diagnosis not present

## 2018-10-19 DIAGNOSIS — G459 Transient cerebral ischemic attack, unspecified: Secondary | ICD-10-CM | POA: Diagnosis not present

## 2018-10-19 DIAGNOSIS — Z7401 Bed confinement status: Secondary | ICD-10-CM | POA: Diagnosis not present

## 2018-10-19 DIAGNOSIS — E119 Type 2 diabetes mellitus without complications: Secondary | ICD-10-CM | POA: Diagnosis not present

## 2018-10-19 DIAGNOSIS — Z8673 Personal history of transient ischemic attack (TIA), and cerebral infarction without residual deficits: Secondary | ICD-10-CM | POA: Diagnosis not present

## 2018-10-19 DIAGNOSIS — D649 Anemia, unspecified: Secondary | ICD-10-CM | POA: Diagnosis not present

## 2018-10-19 DIAGNOSIS — K648 Other hemorrhoids: Secondary | ICD-10-CM | POA: Diagnosis not present

## 2018-10-19 DIAGNOSIS — R319 Hematuria, unspecified: Secondary | ICD-10-CM | POA: Diagnosis not present

## 2018-10-19 DIAGNOSIS — F329 Major depressive disorder, single episode, unspecified: Secondary | ICD-10-CM | POA: Diagnosis not present

## 2018-10-19 DIAGNOSIS — S7292XA Unspecified fracture of left femur, initial encounter for closed fracture: Secondary | ICD-10-CM | POA: Diagnosis not present

## 2018-10-19 DIAGNOSIS — R4182 Altered mental status, unspecified: Secondary | ICD-10-CM | POA: Diagnosis not present

## 2018-10-19 DIAGNOSIS — E039 Hypothyroidism, unspecified: Secondary | ICD-10-CM | POA: Diagnosis not present

## 2018-10-19 LAB — GLUCOSE, CAPILLARY
Glucose-Capillary: 135 mg/dL — ABNORMAL HIGH (ref 70–99)
Glucose-Capillary: 166 mg/dL — ABNORMAL HIGH (ref 70–99)

## 2018-10-19 NOTE — Progress Notes (Signed)
Attempted to call report to [facility]. I was told the nurse was busy and I left my name and number for a call back.

## 2018-10-19 NOTE — Progress Notes (Signed)
Gave report to Nia at [facility].

## 2018-10-19 NOTE — Discharge Summary (Signed)
Physician Discharge Summary   Patient ID: Tyler [name] MRN: [id number] DOB/AGE: Sep 12, 1928 85 y.o.  Admit date: 10/12/2018 Discharge date: 10/19/2018  Primary Diagnosis: Left femoral neck fracture   Admission Diagnoses:  Past Medical History:  Diagnosis Date   Cancer [facility])    Prostate   Diabetes mellitus without complication ([place])    Hypertension    Kidney stones    Stroke [facility])    Discharge Diagnoses:   Active Problems:   Fracture of femoral neck, left, closed ([place])  Estimated body mass index is 22.24 kg/m as calculated from the following:   Height as of this encounter: 5\' 10"  (1.778 m).   Weight as of this encounter: 70.3 kg.  Procedure:  Procedure(s) (LRB): Left hip pinning (Left)   Consults: Hospitalist  HPI: Tyler [name] is an 85 y.o. male, had a fall last week sustaining a minimally displaced  Left femoral neck fracture. The patient has intense pain in the Left hip. He presented to the office and was diagnosed with the fractureThe patient presents for in situ pinning of the Left femoral neck fracture.   Laboratory Data: Admission on 10/12/2018  Component Date Value Ref Range Status   Hgb A1c MFr Bld 10/12/2018 6.5* 4.8 - 5.6 % Final   Comment: (NOTE) Pre diabetes:          5.7%-6.4% Diabetes:              >6.4% Glycemic control for   <7.0% adults with diabetes    Mean Plasma Glucose 10/12/2018 139.85  mg/dL Final   Performed at [place] [address] [place], [place] [address]   Sodium 10/12/2018 137  135 - 145 mmol/L Final   Potassium 10/12/2018 4.2  3.5 - 5.1 mmol/L Final   Chloride 10/12/2018 107  98 - 111 mmol/L Final   CO2 10/12/2018 21* 22 - 32 mmol/L Final   Glucose, Bld 10/12/2018 128* 70 - 99 mg/dL Final   BUN 10/12/2018 30* 8 - 23 mg/dL Final   Creatinine, Ser 10/12/2018 1.36* 0.61 - 1.24 mg/dL Final   Calcium 10/12/2018 9.7  8.9 - 10.3 mg/dL Final   GFR calc non Af Amer 10/12/2018 46* >60 mL/min Final   GFR calc  Af Amer 10/12/2018 54* >60 mL/min Final   Anion gap 10/12/2018 9  5 - 15 Final   Performed at [facility], [place] [address] [place], [place] [address]   WBC 10/12/2018 7.4  4.0 - 10.5 K/uL Final   RBC 10/12/2018 3.87* 4.22 - 5.81 MIL/uL Final   Hemoglobin 10/12/2018 12.3* 13.0 - 17.0 g/dL Final   HCT 10/12/2018 38.6* 39.0 - 52.0 % Final   MCV 10/12/2018 99.7  80.0 - 100.0 fL Final   MCH 10/12/2018 31.8  26.0 - 34.0 pg Final   MCHC 10/12/2018 31.9  30.0 - 36.0 g/dL Final   RDW 10/12/2018 12.9  11.5 - 15.5 % Final   Platelets 10/12/2018 227  150 - 400 K/uL Final   nRBC 10/12/2018 0.0  0.0 - 0.2 % Final   Performed at [facility], [place] [address], [place], [place] [address]   SARS Coronavirus 2 10/12/2018 NEGATIVE  NEGATIVE Final   Comment: (NOTE) If result is NEGATIVE SARS-CoV-2 target nucleic acids are NOT DETECTED. The SARS-CoV-2 RNA is generally detectable in upper and lower  respiratory specimens during the acute phase of infection. The lowest  concentration of SARS-CoV-2 viral copies this assay can detect is 250  copies / mL. A negative result does not preclude SARS-CoV-2 infection  and should not be used as the sole basis for treatment or other  patient management decisions.  A negative result may occur with  improper specimen collection / handling, submission of specimen other  than nasopharyngeal swab, presence of viral mutation(s) within the  areas targeted by this assay, and inadequate number of viral copies  (<250 copies / mL). A negative result must be combined with clinical  observations, patient history, and epidemiological information. If result is POSITIVE SARS-CoV-2 target nucleic acids are DETECTED. The SARS-CoV-2 RNA is generally detectable in upper and lower  respiratory specimens dur                          ing the acute phase of infection.  Positive  results are indicative of active infection with SARS-CoV-2.   Clinical  correlation with patient history and other diagnostic information is  necessary to determine patient infection status.  Positive results do  not rule out bacterial infection or co-infection with other viruses. If result is PRESUMPTIVE POSTIVE SARS-CoV-2 nucleic acids MAY BE PRESENT.   A presumptive positive result was obtained on the submitted specimen  and confirmed on repeat testing.  While 2019 novel coronavirus  (SARS-CoV-2) nucleic acids may be present in the submitted sample  additional confirmatory testing may be necessary for epidemiological  and / or clinical management purposes  to differentiate between  SARS-CoV-2 and other Sarbecovirus currently known to infect humans.  If clinically indicated additional testing with an alternate test  methodology [phone]) is advised. The SARS-CoV-2 RNA is generally  detectable in upper and lower respiratory sp                          ecimens during the acute  phase of infection. The expected result is Negative. Fact Sheet for Patients:  StrictlyIdeas.no Fact Sheet for Healthcare Providers: BankingDealers.co.za This test is not yet approved or cleared by the [place] FDA and has been authorized for detection and/or diagnosis of SARS-CoV-2 by FDA under an Emergency Use Authorization (EUA).  This EUA will remain in effect (meaning this test can be used) for the duration of the COVID-19 declaration under Section 564(b)(1) of the Act, 21 U.S.C. section 360bbb-3(b)(1), unless the authorization is terminated or revoked sooner. Performed at [facility], [place] [address] [place], [place] [address]    ABO/RH(D) 10/12/2018 B POS   Final   Antibody Screen 10/12/2018 NEG   Final   Sample Expiration 10/12/2018    Final                   Value:10/15/2018,2359 Performed at [facility], [place] [address] [place], [place] [address]    ABO/RH(D)  10/12/2018    Final                   Value:B POS Performed at [facility], [place] [address] [place], [place] [address]    Glucose-Capillary 10/12/2018 140* 70 - 99 mg/dL Final   Comment 1 10/12/2018 Document in Chart   Final   WBC 10/13/2018 10.5  4.0 - 10.5 K/uL Final   RBC 10/13/2018 3.24* 4.22 - 5.81 MIL/uL Final   Hemoglobin 10/13/2018 10.2* 13.0 - 17.0 g/dL Final   HCT 10/13/2018 32.4* 39.0 - 52.0 % Final   MCV 10/13/2018 100.0  80.0 - 100.0 fL  Final   MCH 10/13/2018 31.5  26.0 - 34.0 pg Final   MCHC 10/13/2018 31.5  30.0 - 36.0 g/dL Final   RDW 10/13/2018 12.7  11.5 - 15.5 % Final   Platelets 10/13/2018 206  150 - 400 K/uL Final   nRBC 10/13/2018 0.0  0.0 - 0.2 % Final   Performed at [facility], [place] [address] [place], [place] [address]   Sodium 10/13/2018 136  135 - 145 mmol/L Final   Potassium 10/13/2018 4.8  3.5 - 5.1 mmol/L Final   Chloride 10/13/2018 105  98 - 111 mmol/L Final   CO2 10/13/2018 22  22 - 32 mmol/L Final   Glucose, Bld 10/13/2018 242* 70 - 99 mg/dL Final   BUN 10/13/2018 32* 8 - 23 mg/dL Final   Creatinine, Ser 10/13/2018 1.36* 0.61 - 1.24 mg/dL Final   Calcium 10/13/2018 9.0  8.9 - 10.3 mg/dL Final   GFR calc non Af Amer 10/13/2018 46* >60 mL/min Final   GFR calc Af Amer 10/13/2018 54* >60 mL/min Final   Anion gap 10/13/2018 9  5 - 15 Final   Performed at [facility], [place] [address], [place], [place] [address]   Glucose-Capillary 10/12/2018 177* 70 - 99 mg/dL Final   Glucose-Capillary 10/13/2018 203* 70 - 99 mg/dL Final   Glucose-Capillary 10/13/2018 121* 70 - 99 mg/dL Final   Glucose-Capillary 10/13/2018 124* 70 - 99 mg/dL Final   WBC 10/14/2018 8.6  4.0 - 10.5 K/uL Final   RBC 10/14/2018 2.94* 4.22 - 5.81 MIL/uL Final   Hemoglobin 10/14/2018 9.3* 13.0 - 17.0 g/dL Final   HCT 10/14/2018 29.3* 39.0 - 52.0 % Final   MCV 10/14/2018 99.7  80.0 - 100.0 fL Final    MCH 10/14/2018 31.6  26.0 - 34.0 pg Final   MCHC 10/14/2018 31.7  30.0 - 36.0 g/dL Final   RDW 10/14/2018 12.8  11.5 - 15.5 % Final   Platelets 10/14/2018 207  150 - 400 K/uL Final   nRBC 10/14/2018 0.0  0.0 - 0.2 % Final   Performed at [facility], [place] [address], [place], [place] [address]   Sodium 10/14/2018 135  135 - 145 mmol/L Final   Potassium 10/14/2018 4.3  3.5 - 5.1 mmol/L Final   Chloride 10/14/2018 104  98 - 111 mmol/L Final   CO2 10/14/2018 22  22 - 32 mmol/L Final   Glucose, Bld 10/14/2018 122* 70 - 99 mg/dL Final   BUN 10/14/2018 32* 8 - 23 mg/dL Final   Creatinine, Ser 10/14/2018 1.38* 0.61 - 1.24 mg/dL Final   Calcium 10/14/2018 9.0  8.9 - 10.3 mg/dL Final   GFR calc non Af Amer 10/14/2018 46* >60 mL/min Final   GFR calc Af Amer 10/14/2018 53* >60 mL/min Final   Anion gap 10/14/2018 9  5 - 15 Final   Performed at [facility], [place] [address] [place], [place] [address]   Glucose-Capillary 10/13/2018 178* 70 - 99 mg/dL Final   Glucose-Capillary 10/14/2018 125* 70 - 99 mg/dL Final   Glucose-Capillary 10/14/2018 112* 70 - 99 mg/dL Final   Glucose-Capillary 10/14/2018 148* 70 - 99 mg/dL Final   WBC 10/15/2018 10.5  4.0 - 10.5 K/uL Final   RBC 10/15/2018 2.82* 4.22 - 5.81 MIL/uL Final   Hemoglobin 10/15/2018 9.0* 13.0 - 17.0 g/dL Final   HCT 10/15/2018 27.9* 39.0 - 52.0 % Final   MCV 10/15/2018 98.9  80.0 - 100.0 fL Final  MCH 10/15/2018 31.9  26.0 - 34.0 pg Final   MCHC 10/15/2018 32.3  30.0 - 36.0 g/dL Final   RDW 10/15/2018 12.6  11.5 - 15.5 % Final   Platelets 10/15/2018 182  150 - 400 K/uL Final   nRBC 10/15/2018 0.0  0.0 - 0.2 % Final   Performed at [facility], [place] [address] [place], [place] [address]   Glucose-Capillary 10/14/2018 130* 70 - 99 mg/dL Final   Glucose-Capillary 10/15/2018 100* 70 - 99 mg/dL Final   Glucose-Capillary 10/15/2018 162* 70 - 99 mg/dL Final    Glucose-Capillary 10/15/2018 148* 70 - 99 mg/dL Final   Glucose-Capillary 10/15/2018 157* 70 - 99 mg/dL Final   Glucose-Capillary 10/16/2018 99  70 - 99 mg/dL Final   Glucose-Capillary 10/16/2018 226* 70 - 99 mg/dL Final   Glucose-Capillary 10/16/2018 152* 70 - 99 mg/dL Final   Glucose-Capillary 10/16/2018 150* 70 - 99 mg/dL Final   Glucose-Capillary 10/17/2018 113* 70 - 99 mg/dL Final   Glucose-Capillary 10/17/2018 200* 70 - 99 mg/dL Final   Glucose-Capillary 10/17/2018 101* 70 - 99 mg/dL Final   Glucose-Capillary 10/17/2018 144* 70 - 99 mg/dL Final   Glucose-Capillary 10/18/2018 120* 70 - 99 mg/dL Final   Glucose-Capillary 10/18/2018 109* 70 - 99 mg/dL Final   WBC 10/18/2018 7.7  4.0 - 10.5 K/uL Final   RBC 10/18/2018 3.20* 4.22 - 5.81 MIL/uL Final   Hemoglobin 10/18/2018 10.3* 13.0 - 17.0 g/dL Final   HCT 10/18/2018 32.6* 39.0 - 52.0 % Final   MCV 10/18/2018 101.9* 80.0 - 100.0 fL Final   MCH 10/18/2018 32.2  26.0 - 34.0 pg Final   MCHC 10/18/2018 31.6  30.0 - 36.0 g/dL Final   RDW 10/18/2018 12.8  11.5 - 15.5 % Final   Platelets 10/18/2018 238  150 - 400 K/uL Final   nRBC 10/18/2018 0.0  0.0 - 0.2 % Final   Performed at [facility], [place] [address] [place], [place] [address]   Sodium 10/18/2018 136  135 - 145 mmol/L Final   Potassium 10/18/2018 4.2  3.5 - 5.1 mmol/L Final   Chloride 10/18/2018 102  98 - 111 mmol/L Final   CO2 10/18/2018 21* 22 - 32 mmol/L Final   Glucose, Bld 10/18/2018 131* 70 - 99 mg/dL Final   BUN 10/18/2018 37* 8 - 23 mg/dL Final   Creatinine, Ser 10/18/2018 1.39* 0.61 - 1.24 mg/dL Final   Calcium 10/18/2018 9.4  8.9 - 10.3 mg/dL Final   GFR calc non Af Amer 10/18/2018 45* >60 mL/min Final   GFR calc Af Amer 10/18/2018 52* >60 mL/min Final   Anion gap 10/18/2018 13  5 - 15 Final   Performed at [facility], [place] [address] [place], [place] [address]   Glucose-Capillary 10/18/2018 183*  70 - 99 mg/dL Final   Glucose-Capillary 10/18/2018 110* 70 - 99 mg/dL Final   Glucose-Capillary 10/18/2018 151* 70 - 99 mg/dL Final   Glucose-Capillary 10/19/2018 135* 70 - 99 mg/dL Final     X-Rays:Mr Hip Left Wo Contrast  Result Date: 10/09/2018 CLINICAL DATA:  Left femoral neck fracture. Recent right-sided pelvic fractures after fall in February. EXAM: MR OF THE LEFT HIP WITHOUT CONTRAST TECHNIQUE: Multiplanar, multisequence MR imaging was performed. No intravenous contrast was administered. COMPARISON:  Pelvic and right hip x-rays dated June 12, 2018. CT abdomen pelvis dated January 22, 2015. FINDINGS: Bones: Acute to subacute nondisplaced fracture of the left femoral neck with surrounding marrow edema. Subacute  fractures of the bilateral sacral ala, right superior and inferior pubic rami, and left parasymphyseal bone. Prior right femur cephalomedullary rod fixation. No dislocation or avascular necrosis. No focal bone lesion. Articular cartilage and labrum Articular cartilage: No focal chondral defect or subchondral signal abnormality identified. Labrum: Degeneration and blunting of the left anterior superior labrum. Joint or bursal effusion Joint effusion: Small left hip joint effusion. Bursae: No focal periarticular fluid collection. Muscles and tendons Muscles and tendons: The visualized gluteus, hamstring and iliopsoas tendons appear normal. Mild edema involving the bilateral iliacus, gluteus minimus, adductor muscles. No muscle atrophy. Other findings Miscellaneous: The visualized internal pelvic contents appear unremarkable. IMPRESSION: 1. Acute to subacute nondisplaced fracture of the left femoral neck. 2. Subacute, healing fractures of the bilateral sacral ala, right superior and inferior pubic rami, and left parasymphyseal bone. These results will be called to the ordering clinician or representative by the Radiologist Assistant, and communication documented in the PACS or zVision  Dashboard. Electronically Signed   By: Titus Dubin M.D.   On: 10/09/2018 09:48   Dg Chest Port 1 View  Result Date: 10/14/2018 CLINICAL DATA:  Chest pain today. EXAM: PORTABLE CHEST 1 VIEW COMPARISON:  PA and lateral chest 07/23/2016. FINDINGS: Lungs clear. Heart size normal. Aortic atherosclerosis noted. No pneumothorax or pleural fluid. No bony abnormality. IMPRESSION: No acute disease. Atherosclerosis. Electronically Signed   By: Inge Rise M.D.   On: 10/14/2018 11:36   Dg C-arm 1-60 Min-no Report  Result Date: 10/12/2018 Fluoroscopy was utilized by the requesting physician.  No radiographic interpretation.   Dg Hip Operative Unilat With Pelvis Left  Result Date: 10/12/2018 CLINICAL DATA:  Hip fracture repair. FLUOROSCOPY TIME:  50 seconds. EXAM: OPERATIVE LEFT HIP (WITH PELVIS IF PERFORMED) 2 VIEWS TECHNIQUE: Fluoroscopic spot image(s) were submitted for interpretation post-operatively. COMPARISON:  MRI October 09, 2018 FINDINGS: Three screws cross the left hip fracture. Hardware is in good position. IMPRESSION: Hip fracture repair as above. Electronically Signed   By: Dorise Bullion III M.D   On: 10/12/2018 19:27   Ct Head Code Stroke Wo Contrast  Addendum Date: 10/18/2018   ADDENDUM REPORT: 10/18/2018 06:56 ADDENDUM: These results were called by telephone at the time of interpretation on 10/18/2018 at 6:53 am to [facility], who verbally acknowledged these results. Electronically Signed   By: San Morelle M.D.   On: 10/18/2018 06:56   Result Date: 10/18/2018 CLINICAL DATA:  Code stroke. Sudden onset of weakness, malaise, and now nonverbal. Symptoms noticed at 6 a.m. Last seen normal at 3 a.m. Left hip ORIF 10/12/2018 EXAM: CT HEAD WITHOUT CONTRAST TECHNIQUE: Contiguous axial images were obtained from the base of the skull through the vertex without intravenous contrast. COMPARISON:  CT head without contrast 06/20/2018 FINDINGS: Brain: Moderate atrophy and white matter  disease is again seen. A remote lacunar infarct is present in the left corona radiata. Areas of hypoattenuation involving the thalami bilaterally are stable. The ventricles are proportionate to the degree of atrophy. The brainstem and cerebellum are within normal limits. No acute extra-axial fluid collection is present. Vascular: Atherosclerotic calcifications are present within the cavernous internal carotid arteries bilaterally. There is no hyperdense vessel. Skull: Calvarium is intact. No focal lytic or blastic lesions are present. Sinuses/Orbits: The paranasal sinuses and mastoid air cells are clear. Bilateral lens replacements are noted. Globes and orbits are otherwise unremarkable. ASPECTS [facility] Stroke Program Early CT Score) - Ganglionic level infarction (caudate, lentiform nuclei, internal capsule, insula, M1-M3 cortex): 7/7 - Supraganglionic  infarction (M4-M6 cortex): 3/3 Total score (0-10 with 10 being normal): 10/10 IMPRESSION: 1. No acute intracranial abnormality or significant interval change. 2. Stable moderate atrophy and diffuse white matter disease. This likely reflects the sequela of chronic microvascular ischemia. 3. Atherosclerosis 4. ASPECTS is 10/10 Electronically Signed: By: San Morelle M.D. On: 10/18/2018 06:46    EKG: Orders placed or performed during the hospital encounter of 10/12/18   EKG 12-Lead   EKG 12-Lead     Hospital Course: Tyler [name] is a 85 y.o. who was admitted to [facility]. They were brought to the operating room on 10/12/2018 and underwent Procedure(s): Left hip pinning.  Patient tolerated the procedure well and was later transferred to the recovery room and then to the orthopaedic floor for postoperative care. They were given PO and IV analgesics for pain control following their surgery. They were given 24 hours of postoperative antibiotics of  Anti-infectives (From admission, onward)   Start     Dose/Rate Route Frequency Ordered Stop    10/13/18 0600  ceFAZolin (ANCEF) IVPB 2g/100 mL premix     2 g 200 mL/hr over 30 Minutes Intravenous On call to O.R. 10/12/18 1413 10/12/18 1739   10/12/18 2300  ceFAZolin (ANCEF) IVPB 2g/100 mL premix     2 g 200 mL/hr over 30 Minutes Intravenous Every 6 hours 10/12/18 2002 10/13/18 0529     and started on DVT prophylaxis in the form of Aspirin.   PT and OT were ordered for total joint protocol. Discharge planning consulted to help with postop disposition and equipment needs. Patient had a decent night on the evening of surgery. They started to get up OOB with therapy on POD #1. Patient had complaints of constipation, dulcolax ordered in addition to BID colace. Continued to work with therapy into POD #2. Pt was still having issues with constipation, fleet enema ordered. Additionally having issues with chest pain, patient was having quite a bit of anxiety regarding recovery. Reported some chest wall soreness with respiration and when straining to have a BM. Chest XR and EKG obtained to evaluate chest pain, both of which came back WNL. Patient worked with therapy on POD #3, #4, and #5. It was determined that patient would benefit most from discharge to skilled nursing facility once a bed was available. On POD #6, patient was noted that AM to have lethargy, right arm twitching and abnormal movements. Teleneurology consult placed and head CT ordered to r/o stroke. CT showed no acute hemorrhage or infarct. Neuro deemed no tPA necessary. It was noted that symptoms did follow receiving a dose of norco. Narcotic medications were discontinued and only tylenol was given from that point forward for pain relief. Hospitalists were consulted to clear medically prior to discharge. Domenic Polite, MD consulted on patient and deemed medically clear for d/c. A bed was available, and patient was kept for one additional night to ensure no further episodes occurred. On POD #7, patient was seen during rounds. Dressing was changed  and incision was clean, dry, and intact with no drainage. Mr. Chap was ready for discharge, although still showing some anxiety regarding his recovery. Hip was healing well. He was discharged later that day to SNF in stable condition.   Diet: Regular diet Activity: WBAT Follow-up: in 1 week Disposition: Skilled nursing facility Discharged Condition: stable   Discharge Instructions    Call MD / Call 911   Complete by: As directed    If you experience chest pain or  shortness of breath, CALL 911 and be transported to the hospital emergency room.  If you develope a fever above 101 F, pus (white drainage) or increased drainage or redness at the wound, or calf pain, call your surgeon's office.   Constipation Prevention   Complete by: As directed    Drink plenty of fluids.  Prune juice may be helpful.  You may use a stool softener, such as Colace (over the counter) 100 mg twice a day.  Use MiraLax (over the counter) for constipation as needed.   Diet - low sodium heart healthy   Complete by: As directed    Discharge wound care:   Complete by: As directed    If you have a hip bandage, keep it clean and dry.  Change your bandage every day with 4x4 gauze and paper tape. Pat dry after bathing.  DO NOT put lotion or powder on your incision.   Do not sit on low chairs, stoools or toilet seats, as it may be difficult to get up from low surfaces   Complete by: As directed    Weight bearing as tolerated   Complete by: As directed      Allergies as of 10/19/2018      Reactions   Sulfa Drugs Cross Reactors Anaphylaxis, Rash   Lungs fill with fluid      Medication List    TAKE these medications   acetaminophen 325 MG tablet Commonly known as: TYLENOL Take 2 tablets (650 mg total) by mouth every 6 (six) hours. What changed:   when to take this  reasons to take this   amLODipine 2.5 MG tablet Commonly known as: NORVASC Take 1 tablet (2.5 mg total) by mouth at bedtime. What changed: when  to take this   aspirin 325 MG EC tablet Take 1 tablet (325 mg total) by mouth daily with breakfast for 14 days. Then take one 81 mg aspirin once a day for three weeks.   atorvastatin 20 MG tablet Commonly known as: LIPITOR Take 1 tablet (20 mg total) by mouth daily. What changed: when to take this   camphor-menthol lotion Commonly known as: SARNA Apply topically as needed for itching.   clopidogrel 75 MG tablet Commonly known as: PLAVIX Take 1 tablet (75 mg total) by mouth daily with breakfast.   docusate sodium 100 MG capsule Commonly known as: COLACE Take 200 mg by mouth at bedtime.   Flaxseed Oil 1000 MG Caps Take 1,000 mg by mouth 2 (two) times a day.   glimepiride 2 MG tablet Commonly known as: AMARYL Take 1 mg by mouth daily with breakfast.   losartan 50 MG tablet Commonly known as: COZAAR Take 1 tablet (50 mg total) by mouth daily. What changed: when to take this   Melatonin 5 MG Tabs Take 5 mg by mouth at bedtime.   methocarbamol 500 MG tablet Commonly known as: ROBAXIN Take 1 tablet (500 mg total) by mouth every 6 (six) hours as needed for muscle spasms.   multivitamin with minerals Tabs tablet Take 1 tablet by mouth daily.   ONE TOUCH ULTRA TEST test strip Generic drug: glucose blood   onetouch ultrasoft lancets   pantoprazole 40 MG tablet Commonly known as: PROTONIX Take 1 tablet (40 mg total) by mouth daily.   sertraline 50 MG tablet Commonly known as: ZOLOFT Take 100 mg by mouth daily.   traMADol 50 MG tablet Commonly known as: ULTRAM Take 50 mg by mouth 2 (two) times a day.  vitamin B-12 1000 MCG tablet Commonly known as: CYANOCOBALAMIN Take 1,000 mcg by mouth at bedtime.   Vitamin D3 50 MCG (2000 UT) Tabs Take 2,000 Units by mouth daily.            Discharge Care Instructions  (From admission, onward)         Start     Ordered   10/13/18 0000  Weight bearing as tolerated     10/13/18 0817   10/13/18 0000  Discharge wound  care:    Comments: If you have a hip bandage, keep it clean and dry.  Change your bandage every day with 4x4 gauze and paper tape. Pat dry after bathing.  DO NOT put lotion or powder on your incision.   10/13/18 0817         Follow-up Information    Gaynelle Arabian, MD. Schedule an appointment as soon as possible for a visit on 10/25/2018.   Specialty: Orthopedic Surgery Contact information: [address] [place] [address] [phone]        [facility] Follow up.   Why: agency will provide home health physical therapy. agency will contcat you to set up initial visit. Contact information: [phone]          Signed: Theresa Duty, PA-C Orthopedic Surgery 10/19/2018, 10:12 AM

## 2018-10-19 NOTE — Progress Notes (Signed)
Physical Therapy Treatment Patient Details Name: Tyler [name] MRN: [id number] DOB: May 13, 1928 Today's Date: 10/19/2018    History of Present Illness L IM Nail Hx Prostate CA, HTN    PT Comments    Pt with improved ambulation distance today, still considerably lacking safety awareness with mobility and requires multimodal cues for safety. Pt presenting with anxiety this session with mobility. Pt plans to d/c to SNF today.    Follow Up Recommendations  SNF     Equipment Recommendations  None recommended by PT    Recommendations for Other Services       Precautions / Restrictions Precautions Precautions: None Restrictions Weight Bearing Restrictions: No Other Position/Activity Restrictions: WBAT    Mobility  Bed Mobility Overal bed mobility: Needs Assistance Bed Mobility: Sit to Supine;Rolling Rolling: Min assist     Sit to supine: Mod assist;+2 for safety/equipment   General bed mobility comments: Pt up in chair upon PT arrival to room. Mod assist +2 for LE management, trunk lowering, and positioning in bed. Condom cath came off during bed mobility, RN notified. Pt with bilateral rolling with min assist for completion of roll onto side, use of bedrails.  Transfers Overall transfer level: Needs assistance Equipment used: Rolling walker (2 wheeled) Transfers: Sit to/from Stand Sit to Stand: Mod assist;+2 safety/equipment         General transfer comment: Mod assist for power up, increased time to rise, steadying upon standing. Verbal cuing for hand placement when rising and sitting. unsteady in standing. Sit to stand x2 from bed and BSC. Assist for pericare.  Ambulation/Gait Ambulation/Gait assistance: Min assist;+2 safety/equipment Gait Distance (Feet): 40 Feet Assistive device: Rolling walker (2 wheeled) Gait Pattern/deviations: Step-to pattern;Step-through pattern;Decreased stance time - left;Decreased stride length;Trunk flexed Gait velocity: decr   General  Gait Details: Min assist for steadying, pt placement and management of RW. frequent VC for placeement in RW, upright posture.   Stairs             Wheelchair Mobility    Modified Rankin (Stroke Patients Only)       Balance Overall balance assessment: History of Falls;Needs assistance Sitting-balance support: Feet supported Sitting balance-Leahy Scale: Fair     Standing balance support: Bilateral upper extremity supported;During functional activity Standing balance-Leahy Scale: Poor                              Cognition Arousal/Alertness: Awake/alert Behavior During Therapy: WFL for tasks assessed/performed Overall Cognitive Status: Within Functional Limits for tasks assessed                                 General Comments: Pt tangential, requires frequent verbal cues for safety      Exercises      General Comments        Pertinent Vitals/Pain Pain Assessment: Faces Faces Pain Scale: Hurts little more Pain Location: L hip with increased mobility Pain Descriptors / Indicators: Grimacing Pain Intervention(s): Limited activity within patient's tolerance;Monitored during session;Premedicated before session    Home Living                      Prior Function            PT Goals (current goals can now be found in the care plan section) Acute Rehab PT Goals PT Goal Formulation: With patient Time For Goal  Achievement: 10/20/18 Potential to Achieve Goals: Good Progress towards PT goals: Progressing toward goals    Frequency    Min 3X/week      PT Plan Current plan remains appropriate    Co-evaluation              AM-PAC PT "6 Clicks" Mobility   Outcome Measure  Help needed turning from your back to your side while in a flat bed without using bedrails?: A Little Help needed moving from lying on your back to sitting on the side of a flat bed without using bedrails?: A Lot Help needed moving to and from a  bed to a chair (including a wheelchair)?: A Lot Help needed standing up from a chair using your arms (e.g., wheelchair or bedside chair)?: A Lot Help needed to walk in hospital room?: A Little Help needed climbing 3-5 steps with a railing? : Total 6 Click Score: 13    End of Session Equipment Utilized During Treatment: Gait belt Activity Tolerance: Patient limited by pain Patient left: in bed;with call bell/phone within reach;with bed alarm set Nurse Communication: Mobility status PT Visit Diagnosis: Other abnormalities of gait and mobility (R26.89)     Time: 9371-6967 PT Time Calculation (min) (ACUTE ONLY): 25 min  Charges:  $Gait Training: 8-22 mins $Therapeutic Activity: 8-22 mins                    Tyler [name], PT Acute Rehabilitation Services Pager [phone]  Office [phone]    Tyler [name] D Tyler [name] 10/19/2018, 1:36 PM

## 2018-10-19 NOTE — Progress Notes (Signed)
   Subjective: 7 Days Post-Op Procedure(s) (LRB): Left hip pinning (Left) Patient reports pain as mild.   Patient seen in rounds with Dr. Wynelle Link. Patient is still fairly anxious this AM, but is ready for discharge from the hospital. Endorses left hip pain.   Objective: Vital signs in last 24 hours: Temp:  [97.8 F (36.6 C)-98.4 F (36.9 C)] 97.8 F (36.6 C) (06/17 0616) Pulse Rate:  [75-89] 79 (06/17 0616) Resp:  [16-18] 18 (06/17 0616) BP: (138-166)/(62-95) 159/95 (06/17 0616) SpO2:  [100 %] 100 % (06/17 0616)  Intake/Output from previous day:  Intake/Output Summary (Last 24 hours) at 10/19/2018 0800 Last data filed at 10/19/2018 0624 Gross per 24 hour  Intake 780 ml  Output 650 ml  Net 130 ml    Labs: Recent Labs    10/18/18 0825  HGB 10.3*   Recent Labs    10/18/18 0825  WBC 7.7  RBC 3.20*  HCT 32.6*  PLT 238   Recent Labs    10/18/18 0825  NA 136  K 4.2  CL 102  CO2 21*  BUN 37*  CREATININE 1.39*  GLUCOSE 131*  CALCIUM 9.4   Exam: General - Patient is Alert and Oriented Extremity - Neurologically intact Neurovascular intact Sensation intact distally Dorsiflexion/Plantar flexion intact Dressing/Incision - clean, dry, no drainage Motor Function - intact, moving foot and toes well on exam.   Past Medical History:  Diagnosis Date  . Cancer [facility])    Prostate  . Diabetes mellitus without complication ([place])   . Hypertension   . Kidney stones   . Stroke [facility])     Assessment/Plan: 7 Days Post-Op Procedure(s) (LRB): Left hip pinning (Left) Active Problems:   Fracture of femoral neck, left, closed ([place])  Estimated body mass index is 22.24 kg/m as calculated from the following:   Height as of this encounter: 5\' 10"  (1.778 m).   Weight as of this encounter: 70.3 kg. Up with therapy  DVT Prophylaxis - Aspirin Weight-bearing as tolerated  Cleared medically by hospitalist yesterday with no further episodes. Plan for discharge to SNF today.  Follow-up in the office in 2 weeks.   Theresa Duty, PA-C Orthopedic Surgery 10/19/2018, 8:00 AM

## 2018-10-19 NOTE — Progress Notes (Addendum)
Mr. Quevedo is feeling well this morning, he continues to have left hip pain that is controlled with analgesics.  No further altere mentation.  His vital signs have been stable, blood pressure 159/95, heart rate 79, respiratory rate 18, temperature 97.8, oxygen saturation 100% on room air. He is awake and alert, his lungs are clear to auscultation bilaterally, heart S1-S2 present rhythmic, his abdomen soft, no lower extremity edema.  No focal neurologic deficit.  His  kidney function is stable with a creatinine 1.39, potassium 4.2 serum bicarb 21.  He does not have a leukocytosis and his hemoglobin/hematocrit are stable 10.3/32.6.  His toxic encephalopathy has resolved, continue pain control with acetaminophen, avoid narcotics.  Patient is medically stable to transfer to skilled nursing facility.  Will sign off, please call if further questions.

## 2018-10-19 NOTE — TOC Transition Note (Signed)
Transition of Care [facility]) - CM/SW Discharge Note   Patient Details  Name: JAXAN MICHEL MRN: [id number] Date of Birth: 07-Feb-1929  Transition of Care [facility]) CM/SW Contact:  Lia Hopping, [employer] Phone Number: 10/19/2018, 11:49 AM   Clinical Narrative:    Apolonio Schneiders [phone] Next 7 days.  Facility will accept patient after 1:30pm Nurse call report to: [phone] Room 3218   Final next level of care: Skilled Nursing Facility Barriers to Discharge: No Barriers Identified   Patient Goals and CMS Choice Patient states their goals for this hospitalization and ongoing recovery are:: to get better CMS Medicare.gov Compare Post Acute Care list provided to:: Patient(wife) Choice offered to / list presented to : Patient, Spouse  Discharge Placement   Existing PASRR number confirmed : 10/15/18          Patient [facility] bed at: [facility]   Name of family member notified: Spouse Patient and family notified of of transfer: 10/19/18  Discharge Plan and Services   Discharge Planning Services: CM Consult Post Acute Care Choice: Home Health          DME Arranged: N/A DME Agency: NA       HH Arranged: PT [place] Agency: [place] ([place]) Date HH Agency Contacted: 10/15/18 Time [place]: 1221 Representative spoke with at [place]: [place] ([place]) Interventions     Readmission Risk Interventions No flowsheet data found.

## 2018-10-20 DIAGNOSIS — E119 Type 2 diabetes mellitus without complications: Secondary | ICD-10-CM | POA: Diagnosis not present

## 2018-10-20 DIAGNOSIS — S72092D Other fracture of head and neck of left femur, subsequent encounter for closed fracture with routine healing: Secondary | ICD-10-CM | POA: Diagnosis not present

## 2018-10-20 DIAGNOSIS — K219 Gastro-esophageal reflux disease without esophagitis: Secondary | ICD-10-CM | POA: Diagnosis not present

## 2018-10-20 DIAGNOSIS — I10 Essential (primary) hypertension: Secondary | ICD-10-CM | POA: Diagnosis not present

## 2018-10-21 DIAGNOSIS — I69359 Hemiplegia and hemiparesis following cerebral infarction affecting unspecified side: Secondary | ICD-10-CM | POA: Diagnosis not present

## 2018-10-21 DIAGNOSIS — I10 Essential (primary) hypertension: Secondary | ICD-10-CM | POA: Diagnosis not present

## 2018-10-21 DIAGNOSIS — C61 Malignant neoplasm of prostate: Secondary | ICD-10-CM | POA: Diagnosis not present

## 2018-10-21 DIAGNOSIS — E1151 Type 2 diabetes mellitus with diabetic peripheral angiopathy without gangrene: Secondary | ICD-10-CM | POA: Diagnosis not present

## 2018-10-21 DIAGNOSIS — F411 Generalized anxiety disorder: Secondary | ICD-10-CM | POA: Diagnosis not present

## 2018-10-21 DIAGNOSIS — S72002A Fracture of unspecified part of neck of left femur, initial encounter for closed fracture: Secondary | ICD-10-CM | POA: Diagnosis not present

## 2018-10-27 DIAGNOSIS — E119 Type 2 diabetes mellitus without complications: Secondary | ICD-10-CM | POA: Diagnosis not present

## 2018-10-27 DIAGNOSIS — Z8673 Personal history of transient ischemic attack (TIA), and cerebral infarction without residual deficits: Secondary | ICD-10-CM | POA: Diagnosis not present

## 2018-10-27 DIAGNOSIS — S72092D Other fracture of head and neck of left femur, subsequent encounter for closed fracture with routine healing: Secondary | ICD-10-CM | POA: Diagnosis not present

## 2018-10-27 DIAGNOSIS — I10 Essential (primary) hypertension: Secondary | ICD-10-CM | POA: Diagnosis not present

## 2018-11-03 DIAGNOSIS — S72092D Other fracture of head and neck of left femur, subsequent encounter for closed fracture with routine healing: Secondary | ICD-10-CM | POA: Diagnosis not present

## 2018-11-03 DIAGNOSIS — E119 Type 2 diabetes mellitus without complications: Secondary | ICD-10-CM | POA: Diagnosis not present

## 2018-11-03 DIAGNOSIS — Z8673 Personal history of transient ischemic attack (TIA), and cerebral infarction without residual deficits: Secondary | ICD-10-CM | POA: Diagnosis not present

## 2018-11-03 DIAGNOSIS — I10 Essential (primary) hypertension: Secondary | ICD-10-CM | POA: Diagnosis not present

## 2018-11-08 DIAGNOSIS — F329 Major depressive disorder, single episode, unspecified: Secondary | ICD-10-CM | POA: Diagnosis not present

## 2018-11-08 DIAGNOSIS — S72092D Other fracture of head and neck of left femur, subsequent encounter for closed fracture with routine healing: Secondary | ICD-10-CM | POA: Diagnosis not present

## 2018-11-08 DIAGNOSIS — R4182 Altered mental status, unspecified: Secondary | ICD-10-CM | POA: Diagnosis not present

## 2018-11-08 DIAGNOSIS — Z8673 Personal history of transient ischemic attack (TIA), and cerebral infarction without residual deficits: Secondary | ICD-10-CM | POA: Diagnosis not present

## 2018-11-09 DIAGNOSIS — I10 Essential (primary) hypertension: Secondary | ICD-10-CM | POA: Diagnosis not present

## 2018-11-09 DIAGNOSIS — S72092D Other fracture of head and neck of left femur, subsequent encounter for closed fracture with routine healing: Secondary | ICD-10-CM | POA: Diagnosis not present

## 2018-11-09 DIAGNOSIS — F329 Major depressive disorder, single episode, unspecified: Secondary | ICD-10-CM | POA: Diagnosis not present

## 2018-11-09 DIAGNOSIS — R4182 Altered mental status, unspecified: Secondary | ICD-10-CM | POA: Diagnosis not present

## 2018-11-11 DIAGNOSIS — K648 Other hemorrhoids: Secondary | ICD-10-CM | POA: Diagnosis not present

## 2018-11-11 DIAGNOSIS — K5909 Other constipation: Secondary | ICD-10-CM | POA: Diagnosis not present

## 2018-11-11 DIAGNOSIS — F329 Major depressive disorder, single episode, unspecified: Secondary | ICD-10-CM | POA: Diagnosis not present

## 2018-11-11 DIAGNOSIS — S72002A Fracture of unspecified part of neck of left femur, initial encounter for closed fracture: Secondary | ICD-10-CM | POA: Diagnosis not present

## 2018-11-11 DIAGNOSIS — I69359 Hemiplegia and hemiparesis following cerebral infarction affecting unspecified side: Secondary | ICD-10-CM | POA: Diagnosis not present

## 2018-11-11 DIAGNOSIS — F411 Generalized anxiety disorder: Secondary | ICD-10-CM | POA: Diagnosis not present

## 2018-11-11 DIAGNOSIS — E1151 Type 2 diabetes mellitus with diabetic peripheral angiopathy without gangrene: Secondary | ICD-10-CM | POA: Diagnosis not present

## 2018-11-11 DIAGNOSIS — C61 Malignant neoplasm of prostate: Secondary | ICD-10-CM | POA: Diagnosis not present

## 2018-11-11 DIAGNOSIS — S72092D Other fracture of head and neck of left femur, subsequent encounter for closed fracture with routine healing: Secondary | ICD-10-CM | POA: Diagnosis not present

## 2018-11-11 DIAGNOSIS — I10 Essential (primary) hypertension: Secondary | ICD-10-CM | POA: Diagnosis not present

## 2018-11-15 DIAGNOSIS — Z8673 Personal history of transient ischemic attack (TIA), and cerebral infarction without residual deficits: Secondary | ICD-10-CM | POA: Diagnosis not present

## 2018-11-15 DIAGNOSIS — S72092D Other fracture of head and neck of left femur, subsequent encounter for closed fracture with routine healing: Secondary | ICD-10-CM | POA: Diagnosis not present

## 2018-11-15 DIAGNOSIS — E119 Type 2 diabetes mellitus without complications: Secondary | ICD-10-CM | POA: Diagnosis not present

## 2018-11-15 DIAGNOSIS — I10 Essential (primary) hypertension: Secondary | ICD-10-CM | POA: Diagnosis not present

## 2018-11-17 DIAGNOSIS — Z8673 Personal history of transient ischemic attack (TIA), and cerebral infarction without residual deficits: Secondary | ICD-10-CM | POA: Diagnosis not present

## 2018-11-17 DIAGNOSIS — S72092D Other fracture of head and neck of left femur, subsequent encounter for closed fracture with routine healing: Secondary | ICD-10-CM | POA: Diagnosis not present

## 2018-11-17 DIAGNOSIS — I10 Essential (primary) hypertension: Secondary | ICD-10-CM | POA: Diagnosis not present

## 2018-11-17 DIAGNOSIS — E119 Type 2 diabetes mellitus without complications: Secondary | ICD-10-CM | POA: Diagnosis not present

## 2018-11-21 DIAGNOSIS — Z8546 Personal history of malignant neoplasm of prostate: Secondary | ICD-10-CM | POA: Diagnosis not present

## 2018-11-21 DIAGNOSIS — Z8673 Personal history of transient ischemic attack (TIA), and cerebral infarction without residual deficits: Secondary | ICD-10-CM | POA: Diagnosis not present

## 2018-11-21 DIAGNOSIS — I129 Hypertensive chronic kidney disease with stage 1 through stage 4 chronic kidney disease, or unspecified chronic kidney disease: Secondary | ICD-10-CM | POA: Diagnosis not present

## 2018-11-21 DIAGNOSIS — R1312 Dysphagia, oropharyngeal phase: Secondary | ICD-10-CM | POA: Diagnosis not present

## 2018-11-21 DIAGNOSIS — E785 Hyperlipidemia, unspecified: Secondary | ICD-10-CM | POA: Diagnosis not present

## 2018-11-21 DIAGNOSIS — E1122 Type 2 diabetes mellitus with diabetic chronic kidney disease: Secondary | ICD-10-CM | POA: Diagnosis not present

## 2018-11-21 DIAGNOSIS — Z9181 History of falling: Secondary | ICD-10-CM | POA: Diagnosis not present

## 2018-11-21 DIAGNOSIS — F331 Major depressive disorder, recurrent, moderate: Secondary | ICD-10-CM | POA: Diagnosis not present

## 2018-11-21 DIAGNOSIS — S72045D Nondisplaced fracture of base of neck of left femur, subsequent encounter for closed fracture with routine healing: Secondary | ICD-10-CM | POA: Diagnosis not present

## 2018-11-21 DIAGNOSIS — K219 Gastro-esophageal reflux disease without esophagitis: Secondary | ICD-10-CM | POA: Diagnosis not present

## 2018-11-21 DIAGNOSIS — S72001D Fracture of unspecified part of neck of right femur, subsequent encounter for closed fracture with routine healing: Secondary | ICD-10-CM | POA: Diagnosis not present

## 2018-11-21 DIAGNOSIS — Z7984 Long term (current) use of oral hypoglycemic drugs: Secondary | ICD-10-CM | POA: Diagnosis not present

## 2018-11-21 DIAGNOSIS — N183 Chronic kidney disease, stage 3 (moderate): Secondary | ICD-10-CM | POA: Diagnosis not present

## 2018-11-21 DIAGNOSIS — S72002D Fracture of unspecified part of neck of left femur, subsequent encounter for closed fracture with routine healing: Secondary | ICD-10-CM | POA: Diagnosis not present

## 2018-12-01 DIAGNOSIS — S72002B Fracture of unspecified part of neck of left femur, initial encounter for open fracture type I or II: Secondary | ICD-10-CM | POA: Diagnosis not present

## 2018-12-26 DIAGNOSIS — E1122 Type 2 diabetes mellitus with diabetic chronic kidney disease: Secondary | ICD-10-CM | POA: Diagnosis not present

## 2018-12-26 DIAGNOSIS — I129 Hypertensive chronic kidney disease with stage 1 through stage 4 chronic kidney disease, or unspecified chronic kidney disease: Secondary | ICD-10-CM | POA: Diagnosis not present

## 2018-12-26 DIAGNOSIS — N183 Chronic kidney disease, stage 3 (moderate): Secondary | ICD-10-CM | POA: Diagnosis not present

## 2018-12-26 DIAGNOSIS — S72045D Nondisplaced fracture of base of neck of left femur, subsequent encounter for closed fracture with routine healing: Secondary | ICD-10-CM | POA: Diagnosis not present

## 2019-02-08 DIAGNOSIS — K219 Gastro-esophageal reflux disease without esophagitis: Secondary | ICD-10-CM | POA: Diagnosis not present

## 2019-02-08 DIAGNOSIS — I10 Essential (primary) hypertension: Secondary | ICD-10-CM | POA: Diagnosis not present

## 2019-02-08 DIAGNOSIS — E1165 Type 2 diabetes mellitus with hyperglycemia: Secondary | ICD-10-CM | POA: Diagnosis not present

## 2019-02-08 DIAGNOSIS — D649 Anemia, unspecified: Secondary | ICD-10-CM | POA: Diagnosis not present

## 2019-02-08 DIAGNOSIS — Z8673 Personal history of transient ischemic attack (TIA), and cerebral infarction without residual deficits: Secondary | ICD-10-CM | POA: Diagnosis not present

## 2019-02-08 DIAGNOSIS — E118 Type 2 diabetes mellitus with unspecified complications: Secondary | ICD-10-CM | POA: Diagnosis not present

## 2019-02-08 DIAGNOSIS — R748 Abnormal levels of other serum enzymes: Secondary | ICD-10-CM | POA: Diagnosis not present

## 2019-02-08 DIAGNOSIS — E7800 Pure hypercholesterolemia, unspecified: Secondary | ICD-10-CM | POA: Diagnosis not present

## 2019-05-01 DIAGNOSIS — L905 Scar conditions and fibrosis of skin: Secondary | ICD-10-CM | POA: Diagnosis not present

## 2019-05-01 DIAGNOSIS — C44619 Basal cell carcinoma of skin of left upper limb, including shoulder: Secondary | ICD-10-CM | POA: Diagnosis not present

## 2019-05-01 DIAGNOSIS — Z85828 Personal history of other malignant neoplasm of skin: Secondary | ICD-10-CM | POA: Diagnosis not present

## 2019-05-01 DIAGNOSIS — C44722 Squamous cell carcinoma of skin of right lower limb, including hip: Secondary | ICD-10-CM | POA: Diagnosis not present

## 2019-05-01 DIAGNOSIS — D485 Neoplasm of uncertain behavior of skin: Secondary | ICD-10-CM | POA: Diagnosis not present

## 2019-05-29 DIAGNOSIS — Z0000 Encounter for general adult medical examination without abnormal findings: Secondary | ICD-10-CM | POA: Diagnosis not present

## 2019-05-29 DIAGNOSIS — E789 Disorder of lipoprotein metabolism, unspecified: Secondary | ICD-10-CM | POA: Diagnosis not present

## 2019-05-29 DIAGNOSIS — N400 Enlarged prostate without lower urinary tract symptoms: Secondary | ICD-10-CM | POA: Diagnosis not present

## 2019-05-29 DIAGNOSIS — E1165 Type 2 diabetes mellitus with hyperglycemia: Secondary | ICD-10-CM | POA: Diagnosis not present

## 2019-05-29 DIAGNOSIS — D649 Anemia, unspecified: Secondary | ICD-10-CM | POA: Diagnosis not present

## 2019-06-01 DIAGNOSIS — Z0000 Encounter for general adult medical examination without abnormal findings: Secondary | ICD-10-CM | POA: Diagnosis not present

## 2019-06-01 DIAGNOSIS — I10 Essential (primary) hypertension: Secondary | ICD-10-CM | POA: Diagnosis not present

## 2019-06-01 DIAGNOSIS — E7800 Pure hypercholesterolemia, unspecified: Secondary | ICD-10-CM | POA: Diagnosis not present

## 2019-06-01 DIAGNOSIS — E118 Type 2 diabetes mellitus with unspecified complications: Secondary | ICD-10-CM | POA: Diagnosis not present

## 2019-06-01 DIAGNOSIS — K219 Gastro-esophageal reflux disease without esophagitis: Secondary | ICD-10-CM | POA: Diagnosis not present

## 2019-06-01 DIAGNOSIS — Z8673 Personal history of transient ischemic attack (TIA), and cerebral infarction without residual deficits: Secondary | ICD-10-CM | POA: Diagnosis not present

## 2019-06-01 DIAGNOSIS — Z8546 Personal history of malignant neoplasm of prostate: Secondary | ICD-10-CM | POA: Diagnosis not present

## 2019-06-01 DIAGNOSIS — D649 Anemia, unspecified: Secondary | ICD-10-CM | POA: Diagnosis not present

## 2019-06-02 ENCOUNTER — Ambulatory Visit: Payer: PPO

## 2019-06-05 DIAGNOSIS — C44722 Squamous cell carcinoma of skin of right lower limb, including hip: Secondary | ICD-10-CM | POA: Diagnosis not present

## 2019-06-05 DIAGNOSIS — D485 Neoplasm of uncertain behavior of skin: Secondary | ICD-10-CM | POA: Diagnosis not present

## 2019-06-05 DIAGNOSIS — L989 Disorder of the skin and subcutaneous tissue, unspecified: Secondary | ICD-10-CM | POA: Diagnosis not present

## 2019-07-14 DIAGNOSIS — C44722 Squamous cell carcinoma of skin of right lower limb, including hip: Secondary | ICD-10-CM | POA: Diagnosis not present

## 2019-07-14 DIAGNOSIS — D485 Neoplasm of uncertain behavior of skin: Secondary | ICD-10-CM | POA: Diagnosis not present

## 2019-08-24 DIAGNOSIS — D649 Anemia, unspecified: Secondary | ICD-10-CM | POA: Diagnosis not present

## 2019-08-24 DIAGNOSIS — E118 Type 2 diabetes mellitus with unspecified complications: Secondary | ICD-10-CM | POA: Diagnosis not present

## 2019-08-24 DIAGNOSIS — E7800 Pure hypercholesterolemia, unspecified: Secondary | ICD-10-CM | POA: Diagnosis not present

## 2019-08-31 DIAGNOSIS — F329 Major depressive disorder, single episode, unspecified: Secondary | ICD-10-CM | POA: Diagnosis not present

## 2019-08-31 DIAGNOSIS — Z8673 Personal history of transient ischemic attack (TIA), and cerebral infarction without residual deficits: Secondary | ICD-10-CM | POA: Diagnosis not present

## 2019-08-31 DIAGNOSIS — I10 Essential (primary) hypertension: Secondary | ICD-10-CM | POA: Diagnosis not present

## 2019-08-31 DIAGNOSIS — E118 Type 2 diabetes mellitus with unspecified complications: Secondary | ICD-10-CM | POA: Diagnosis not present

## 2019-08-31 DIAGNOSIS — E7800 Pure hypercholesterolemia, unspecified: Secondary | ICD-10-CM | POA: Diagnosis not present

## 2019-08-31 DIAGNOSIS — M2550 Pain in unspecified joint: Secondary | ICD-10-CM | POA: Diagnosis not present

## 2019-09-12 DIAGNOSIS — C61 Malignant neoplasm of prostate: Secondary | ICD-10-CM | POA: Diagnosis not present

## 2019-09-12 DIAGNOSIS — N200 Calculus of kidney: Secondary | ICD-10-CM | POA: Diagnosis not present

## 2019-10-30 DIAGNOSIS — D485 Neoplasm of uncertain behavior of skin: Secondary | ICD-10-CM | POA: Diagnosis not present

## 2019-10-30 DIAGNOSIS — Z85828 Personal history of other malignant neoplasm of skin: Secondary | ICD-10-CM | POA: Diagnosis not present

## 2019-10-30 DIAGNOSIS — L905 Scar conditions and fibrosis of skin: Secondary | ICD-10-CM | POA: Diagnosis not present

## 2019-11-28 DIAGNOSIS — E7800 Pure hypercholesterolemia, unspecified: Secondary | ICD-10-CM | POA: Diagnosis not present

## 2019-11-28 DIAGNOSIS — E118 Type 2 diabetes mellitus with unspecified complications: Secondary | ICD-10-CM | POA: Diagnosis not present

## 2019-11-28 DIAGNOSIS — Z8673 Personal history of transient ischemic attack (TIA), and cerebral infarction without residual deficits: Secondary | ICD-10-CM | POA: Diagnosis not present

## 2019-11-28 DIAGNOSIS — I10 Essential (primary) hypertension: Secondary | ICD-10-CM | POA: Diagnosis not present

## 2019-11-28 DIAGNOSIS — R749 Abnormal serum enzyme level, unspecified: Secondary | ICD-10-CM | POA: Diagnosis not present

## 2019-11-30 DIAGNOSIS — E7800 Pure hypercholesterolemia, unspecified: Secondary | ICD-10-CM | POA: Diagnosis not present

## 2019-11-30 DIAGNOSIS — N183 Chronic kidney disease, stage 3 unspecified: Secondary | ICD-10-CM | POA: Diagnosis not present

## 2019-11-30 DIAGNOSIS — R748 Abnormal levels of other serum enzymes: Secondary | ICD-10-CM | POA: Diagnosis not present

## 2019-11-30 DIAGNOSIS — E118 Type 2 diabetes mellitus with unspecified complications: Secondary | ICD-10-CM | POA: Diagnosis not present

## 2019-11-30 DIAGNOSIS — I10 Essential (primary) hypertension: Secondary | ICD-10-CM | POA: Diagnosis not present

## 2019-11-30 DIAGNOSIS — K219 Gastro-esophageal reflux disease without esophagitis: Secondary | ICD-10-CM | POA: Diagnosis not present

## 2019-11-30 DIAGNOSIS — F329 Major depressive disorder, single episode, unspecified: Secondary | ICD-10-CM | POA: Diagnosis not present

## 2019-12-06 ENCOUNTER — Other Ambulatory Visit: Payer: Self-pay

## 2019-12-06 ENCOUNTER — Ambulatory Visit (INDEPENDENT_AMBULATORY_CARE_PROVIDER_SITE_OTHER): Payer: PPO

## 2019-12-06 ENCOUNTER — Ambulatory Visit: Payer: PPO | Admitting: Podiatry

## 2019-12-06 ENCOUNTER — Encounter: Payer: Self-pay | Admitting: Podiatry

## 2019-12-06 DIAGNOSIS — M79676 Pain in unspecified toe(s): Secondary | ICD-10-CM

## 2019-12-06 DIAGNOSIS — M109 Gout, unspecified: Secondary | ICD-10-CM

## 2019-12-06 DIAGNOSIS — B351 Tinea unguium: Secondary | ICD-10-CM

## 2019-12-06 DIAGNOSIS — M778 Other enthesopathies, not elsewhere classified: Secondary | ICD-10-CM | POA: Diagnosis not present

## 2019-12-06 NOTE — Progress Notes (Signed)
Subjective:  Patient ID: Tyler [name], male    DOB: [birth year],  MRN: [id number] HPI Chief Complaint  Patient presents with  . Foot Pain    nail trim,   He comes in c/o right top of foot "red spot that hurts"  He says its painful to walk on his foot and its been swelling  . Nail Problem    86 y.o. male presents with the above complaint.   ROS: Denies fever chills nausea vomiting muscle aches pains calf pain back pain chest pain shortness of breath.  Wife relates a history of gout.  Past Medical History:  Diagnosis Date  . Cancer [facility])    Prostate  . Diabetes mellitus without complication ([place])   . Hypertension   . Kidney stones   . Stroke [facility])    Past Surgical History:  Procedure Laterality Date  . CARDIAC CATHETERIZATION    . cyst surgery on back     removed from end of spine 50 years ago  . FEMUR IM NAIL Right 11/17/2015   Procedure: INTRAMEDULLARY (IM) NAIL FEMORAL;  Surgeon: Marybelle Killings, MD;  Location: [facility];  Service: Orthopedics;  Laterality: Right;  . HIP PINNING,CANNULATED Left 10/12/2018   Procedure: Left hip pinning;  Surgeon: Gaynelle Arabian, MD;  Location: [facility];  Service: Orthopedics;  Laterality: Left;  28min  . KNEE ARTHROSCOPY     both knees  . PROSTATE SURGERY    . SHOULDER SURGERY     right shoulder    Current Outpatient Medications:  .  acetaminophen (TYLENOL) 325 MG tablet, Take 2 tablets (650 mg total) by mouth every 6 (six) hours. (Patient taking differently: Take 650 mg by mouth every 6 (six) hours as needed (pain.). ), Disp: , Rfl:  .  amLODipine (NORVASC) 2.5 MG tablet, Take 1 tablet (2.5 mg total) by mouth at bedtime. (Patient taking differently: Take 2.5 mg by mouth daily. ), Disp: 30 tablet, Rfl: 0 .  atorvastatin (LIPITOR) 20 MG tablet, Take 1 tablet (20 mg total) by mouth daily. (Patient taking differently: Take 20 mg by mouth at bedtime. ), Disp: 30 tablet, Rfl: 0 .  atorvastatin (LIPITOR) 40 MG tablet, Take 40 mg by mouth daily., Disp: ,  Rfl:  .  camphor-menthol (SARNA) lotion, Apply topically as needed for itching., Disp: 222 mL, Rfl: 0 .  Cholecalciferol (VITAMIN D3) 50 MCG (2000 UT) TABS, Take 2,000 Units by mouth daily., Disp: , Rfl:  .  clopidogrel (PLAVIX) 75 MG tablet, Take 1 tablet (75 mg total) by mouth daily with breakfast., Disp: 30 tablet, Rfl: 6 .  docusate sodium (COLACE) 100 MG capsule, Take 200 mg by mouth at bedtime., Disp: , Rfl:  .  glimepiride (AMARYL) 2 MG tablet, Take 1 mg by mouth daily with breakfast., Disp: , Rfl:  .  Lancets (ONETOUCH ULTRASOFT) lancets, , Disp: , Rfl:  .  losartan (COZAAR) 50 MG tablet, Take 1 tablet (50 mg total) by mouth daily. (Patient taking differently: Take 50 mg by mouth every evening. ), Disp: 30 tablet, Rfl: 0 .  Melatonin 5 MG TABS, Take 5 mg by mouth at bedtime., Disp: , Rfl:  .  methocarbamol (ROBAXIN) 500 MG tablet, Take 1 tablet (500 mg total) by mouth every 6 (six) hours as needed for muscle spasms., Disp: 40 tablet, Rfl: 0 .  mirtazapine (REMERON) 15 MG tablet, Take 15 mg by mouth at bedtime., Disp: , Rfl:  .  Multiple Vitamin (MULTIVITAMIN WITH MINERALS) TABS tablet, Take 1  tablet by mouth daily. (Patient not taking: Reported on 10/11/2018), Disp: , Rfl:  .  ONE TOUCH ULTRA TEST test strip, , Disp: , Rfl:  .  pantoprazole (PROTONIX) 40 MG tablet, Take 1 tablet (40 mg total) by mouth daily., Disp: 30 tablet, Rfl: 0 .  sertraline (ZOLOFT) 50 MG tablet, Take 100 mg by mouth daily., Disp: , Rfl:  .  TOLAK 4 % CREA, Apply topically., Disp: , Rfl:  .  vitamin B-12 (CYANOCOBALAMIN) 1000 MCG tablet, Take 1,000 mcg by mouth at bedtime., Disp: , Rfl:   Allergies  Allergen Reactions  . Sulfa Drugs Cross Reactors Anaphylaxis and Rash    Lungs fill with fluid   Review of Systems Objective:  There were no vitals filed for this visit.  General: Well developed, nourished, in no acute distress, alert and oriented x3   Dermatological: Skin is warm, dry and supple bilateral. Nails  x 10 are thick yellow dystrophic clinically mycotic; remaining integument appears unremarkable at this time. There are no open sores, no preulcerative lesions, no rash or signs of infection present.  Vascular: Dorsalis Pedis artery and Posterior Tibial artery pedal pulses are 2/4 bilateral with immedate capillary fill time. Pedal hair growth present. No varicosities and no lower extremity edema present bilateral.   Neruologic: Grossly intact via light touch bilateral. Vibratory intact via tuning fork bilateral. Protective threshold with Semmes Wienstein monofilament intact to all pedal sites bilateral. Patellar and Achilles deep tendon reflexes 2+ bilateral. No Babinski or clonus noted bilateral.   Musculoskeletal: No gross boney pedal deformities bilateral. No pain, crepitus, or limitation noted with foot and ankle range of motion bilateral. Muscular strength 5/5 in all groups tested bilateral.  Mildly erythematous warm first metatarsal medial cuneiform articulation right foot.  No open lesions or wounds.    Gait: Unassisted, Nonantalgic.    Radiographs:  Radiographs demonstrate osseously mature individual with significant osteoarthritis of the midfoot.  Mild osteopenia.  No fractures identified.  Assessment & Plan:   Assessment: Capsulitis possibly gouty capsulitis of the first metatarsal medial cuneiform joint right foot.  Painful elongated toenails bilateral.  Plan: Debrided toenails 1 through 5 bilateral.  Also injected 10 mg of Kenalog 5 mg Marcaine of 40 maximal tenderness today.  Noted alleviation of symptoms when he was leaving the office.  Follow-up with him as needed     Tyler Terada T. [place], [place]

## 2019-12-11 DIAGNOSIS — C44722 Squamous cell carcinoma of skin of right lower limb, including hip: Secondary | ICD-10-CM | POA: Diagnosis not present

## 2019-12-11 DIAGNOSIS — D485 Neoplasm of uncertain behavior of skin: Secondary | ICD-10-CM | POA: Diagnosis not present

## 2019-12-11 DIAGNOSIS — R238 Other skin changes: Secondary | ICD-10-CM | POA: Diagnosis not present

## 2019-12-11 DIAGNOSIS — D0361 Melanoma in situ of right upper limb, including shoulder: Secondary | ICD-10-CM | POA: Diagnosis not present

## 2019-12-25 DIAGNOSIS — L570 Actinic keratosis: Secondary | ICD-10-CM | POA: Diagnosis not present

## 2020-01-04 DIAGNOSIS — L989 Disorder of the skin and subcutaneous tissue, unspecified: Secondary | ICD-10-CM | POA: Diagnosis not present

## 2020-01-04 DIAGNOSIS — C44619 Basal cell carcinoma of skin of left upper limb, including shoulder: Secondary | ICD-10-CM | POA: Diagnosis not present

## 2020-01-04 DIAGNOSIS — D0361 Melanoma in situ of right upper limb, including shoulder: Secondary | ICD-10-CM | POA: Diagnosis not present

## 2020-01-04 DIAGNOSIS — L905 Scar conditions and fibrosis of skin: Secondary | ICD-10-CM | POA: Diagnosis not present

## 2020-01-19 DIAGNOSIS — C44619 Basal cell carcinoma of skin of left upper limb, including shoulder: Secondary | ICD-10-CM | POA: Diagnosis not present

## 2020-05-31 DIAGNOSIS — I10 Essential (primary) hypertension: Secondary | ICD-10-CM | POA: Diagnosis not present

## 2020-05-31 DIAGNOSIS — E118 Type 2 diabetes mellitus with unspecified complications: Secondary | ICD-10-CM | POA: Diagnosis not present

## 2020-05-31 DIAGNOSIS — E789 Disorder of lipoprotein metabolism, unspecified: Secondary | ICD-10-CM | POA: Diagnosis not present

## 2020-05-31 DIAGNOSIS — E1165 Type 2 diabetes mellitus with hyperglycemia: Secondary | ICD-10-CM | POA: Diagnosis not present

## 2020-05-31 DIAGNOSIS — Z0000 Encounter for general adult medical examination without abnormal findings: Secondary | ICD-10-CM | POA: Diagnosis not present

## 2020-05-31 DIAGNOSIS — E7800 Pure hypercholesterolemia, unspecified: Secondary | ICD-10-CM | POA: Diagnosis not present

## 2020-05-31 DIAGNOSIS — E119 Type 2 diabetes mellitus without complications: Secondary | ICD-10-CM | POA: Diagnosis not present

## 2020-06-05 DIAGNOSIS — E7800 Pure hypercholesterolemia, unspecified: Secondary | ICD-10-CM | POA: Diagnosis not present

## 2020-06-05 DIAGNOSIS — R972 Elevated prostate specific antigen [PSA]: Secondary | ICD-10-CM | POA: Diagnosis not present

## 2020-06-05 DIAGNOSIS — K219 Gastro-esophageal reflux disease without esophagitis: Secondary | ICD-10-CM | POA: Diagnosis not present

## 2020-06-05 DIAGNOSIS — Z0000 Encounter for general adult medical examination without abnormal findings: Secondary | ICD-10-CM | POA: Diagnosis not present

## 2020-06-05 DIAGNOSIS — E118 Type 2 diabetes mellitus with unspecified complications: Secondary | ICD-10-CM | POA: Diagnosis not present

## 2020-06-05 DIAGNOSIS — R319 Hematuria, unspecified: Secondary | ICD-10-CM | POA: Diagnosis not present

## 2020-06-05 DIAGNOSIS — F329 Major depressive disorder, single episode, unspecified: Secondary | ICD-10-CM | POA: Diagnosis not present

## 2020-06-05 DIAGNOSIS — Z8546 Personal history of malignant neoplasm of prostate: Secondary | ICD-10-CM | POA: Diagnosis not present

## 2020-06-05 DIAGNOSIS — I10 Essential (primary) hypertension: Secondary | ICD-10-CM | POA: Diagnosis not present

## 2020-06-05 DIAGNOSIS — Z8673 Personal history of transient ischemic attack (TIA), and cerebral infarction without residual deficits: Secondary | ICD-10-CM | POA: Diagnosis not present

## 2020-06-14 DIAGNOSIS — R9721 Rising PSA following treatment for malignant neoplasm of prostate: Secondary | ICD-10-CM | POA: Diagnosis not present

## 2020-06-14 DIAGNOSIS — R3121 Asymptomatic microscopic hematuria: Secondary | ICD-10-CM | POA: Diagnosis not present

## 2020-06-14 DIAGNOSIS — C61 Malignant neoplasm of prostate: Secondary | ICD-10-CM | POA: Diagnosis not present

## 2020-06-21 ENCOUNTER — Other Ambulatory Visit (HOSPITAL_COMMUNITY): Payer: Self-pay | Admitting: Urology

## 2020-06-21 DIAGNOSIS — R9721 Rising PSA following treatment for malignant neoplasm of prostate: Secondary | ICD-10-CM

## 2020-06-21 DIAGNOSIS — C61 Malignant neoplasm of prostate: Secondary | ICD-10-CM

## 2020-07-12 ENCOUNTER — Ambulatory Visit (HOSPITAL_COMMUNITY)
Admission: RE | Admit: 2020-07-12 | Discharge: 2020-07-12 | Disposition: A | Discharge status: Home / Self Care | Payer: PPO | Source: Ambulatory Visit | Attending: Urology | Admitting: Urology

## 2020-07-12 ENCOUNTER — Other Ambulatory Visit: Payer: Self-pay

## 2020-07-12 DIAGNOSIS — M19011 Primary osteoarthritis, right shoulder: Secondary | ICD-10-CM | POA: Diagnosis not present

## 2020-07-12 DIAGNOSIS — R9721 Rising PSA following treatment for malignant neoplasm of prostate: Secondary | ICD-10-CM | POA: Diagnosis not present

## 2020-07-12 DIAGNOSIS — C61 Malignant neoplasm of prostate: Secondary | ICD-10-CM | POA: Insufficient documentation

## 2020-07-12 DIAGNOSIS — M19032 Primary osteoarthritis, left wrist: Secondary | ICD-10-CM | POA: Diagnosis not present

## 2020-07-12 DIAGNOSIS — I700 Atherosclerosis of aorta: Secondary | ICD-10-CM | POA: Diagnosis not present

## 2020-07-12 DIAGNOSIS — M19012 Primary osteoarthritis, left shoulder: Secondary | ICD-10-CM | POA: Diagnosis not present

## 2020-07-12 DIAGNOSIS — N200 Calculus of kidney: Secondary | ICD-10-CM | POA: Diagnosis not present

## 2020-07-12 DIAGNOSIS — N281 Cyst of kidney, acquired: Secondary | ICD-10-CM | POA: Diagnosis not present

## 2020-07-12 MED ORDER — TECHNETIUM TC 99M MEDRONATE IV KIT
21.6000 | PACK | Freq: Once | INTRAVENOUS | Status: AC | PRN
  Administered 2020-07-12: 21.6 via INTRAVENOUS

## 2020-07-25 ENCOUNTER — Ambulatory Visit
Admission: RE | Admit: 2020-07-25 | Discharge: 2020-07-25 | Disposition: A | Discharge status: Home / Self Care | Payer: PPO | Source: Ambulatory Visit | Attending: Urology | Admitting: Urology

## 2020-07-25 ENCOUNTER — Other Ambulatory Visit: Payer: Self-pay | Admitting: Urology

## 2020-07-25 DIAGNOSIS — R9721 Rising PSA following treatment for malignant neoplasm of prostate: Secondary | ICD-10-CM | POA: Diagnosis not present

## 2020-07-25 DIAGNOSIS — M1611 Unilateral primary osteoarthritis, right hip: Secondary | ICD-10-CM | POA: Diagnosis not present

## 2020-07-31 DIAGNOSIS — C61 Malignant neoplasm of prostate: Secondary | ICD-10-CM | POA: Diagnosis not present

## 2020-07-31 DIAGNOSIS — R9721 Rising PSA following treatment for malignant neoplasm of prostate: Secondary | ICD-10-CM | POA: Diagnosis not present

## 2020-08-07 ENCOUNTER — Other Ambulatory Visit (HOSPITAL_COMMUNITY): Payer: Self-pay | Admitting: Urology

## 2020-08-07 DIAGNOSIS — C61 Malignant neoplasm of prostate: Secondary | ICD-10-CM

## 2020-08-22 ENCOUNTER — Ambulatory Visit (HOSPITAL_COMMUNITY)
Admission: RE | Admit: 2020-08-22 | Discharge: 2020-08-22 | Disposition: A | Discharge status: Home / Self Care | Payer: PPO | Source: Ambulatory Visit | Attending: Urology | Admitting: Urology

## 2020-08-22 ENCOUNTER — Other Ambulatory Visit: Payer: Self-pay

## 2020-08-22 DIAGNOSIS — C61 Malignant neoplasm of prostate: Secondary | ICD-10-CM | POA: Insufficient documentation

## 2020-08-22 DIAGNOSIS — R918 Other nonspecific abnormal finding of lung field: Secondary | ICD-10-CM | POA: Diagnosis not present

## 2020-08-22 MED ORDER — PIFLIFOLASTAT F 18 (PYLARIFY) INJECTION
9.0000 | Freq: Once | INTRAVENOUS | Status: AC
  Administered 2020-08-22: 8.43 via INTRAVENOUS

## 2020-09-18 DIAGNOSIS — Z5111 Encounter for antineoplastic chemotherapy: Secondary | ICD-10-CM | POA: Diagnosis not present

## 2020-09-18 DIAGNOSIS — C61 Malignant neoplasm of prostate: Secondary | ICD-10-CM | POA: Diagnosis not present

## 2020-10-01 DIAGNOSIS — E7800 Pure hypercholesterolemia, unspecified: Secondary | ICD-10-CM | POA: Diagnosis not present

## 2020-10-01 DIAGNOSIS — I129 Hypertensive chronic kidney disease with stage 1 through stage 4 chronic kidney disease, or unspecified chronic kidney disease: Secondary | ICD-10-CM | POA: Diagnosis not present

## 2020-10-01 DIAGNOSIS — N183 Chronic kidney disease, stage 3 unspecified: Secondary | ICD-10-CM | POA: Diagnosis not present

## 2020-10-01 DIAGNOSIS — E1122 Type 2 diabetes mellitus with diabetic chronic kidney disease: Secondary | ICD-10-CM | POA: Diagnosis not present

## 2020-10-23 DIAGNOSIS — C61 Malignant neoplasm of prostate: Secondary | ICD-10-CM | POA: Diagnosis not present

## 2020-10-31 DIAGNOSIS — I129 Hypertensive chronic kidney disease with stage 1 through stage 4 chronic kidney disease, or unspecified chronic kidney disease: Secondary | ICD-10-CM | POA: Diagnosis not present

## 2020-10-31 DIAGNOSIS — E1122 Type 2 diabetes mellitus with diabetic chronic kidney disease: Secondary | ICD-10-CM | POA: Diagnosis not present

## 2020-10-31 DIAGNOSIS — E7800 Pure hypercholesterolemia, unspecified: Secondary | ICD-10-CM | POA: Diagnosis not present

## 2020-10-31 DIAGNOSIS — N183 Chronic kidney disease, stage 3 unspecified: Secondary | ICD-10-CM | POA: Diagnosis not present

## 2020-11-29 DIAGNOSIS — E7801 Familial hypercholesterolemia: Secondary | ICD-10-CM | POA: Diagnosis not present

## 2020-11-29 DIAGNOSIS — I10 Essential (primary) hypertension: Secondary | ICD-10-CM | POA: Diagnosis not present

## 2020-11-29 DIAGNOSIS — E118 Type 2 diabetes mellitus with unspecified complications: Secondary | ICD-10-CM | POA: Diagnosis not present

## 2020-12-01 DIAGNOSIS — I129 Hypertensive chronic kidney disease with stage 1 through stage 4 chronic kidney disease, or unspecified chronic kidney disease: Secondary | ICD-10-CM | POA: Diagnosis not present

## 2020-12-01 DIAGNOSIS — E7800 Pure hypercholesterolemia, unspecified: Secondary | ICD-10-CM | POA: Diagnosis not present

## 2020-12-01 DIAGNOSIS — E1122 Type 2 diabetes mellitus with diabetic chronic kidney disease: Secondary | ICD-10-CM | POA: Diagnosis not present

## 2020-12-01 DIAGNOSIS — N183 Chronic kidney disease, stage 3 unspecified: Secondary | ICD-10-CM | POA: Diagnosis not present

## 2020-12-03 DIAGNOSIS — E1165 Type 2 diabetes mellitus with hyperglycemia: Secondary | ICD-10-CM | POA: Diagnosis not present

## 2020-12-03 DIAGNOSIS — E7800 Pure hypercholesterolemia, unspecified: Secondary | ICD-10-CM | POA: Diagnosis not present

## 2020-12-03 DIAGNOSIS — M1991 Primary osteoarthritis, unspecified site: Secondary | ICD-10-CM | POA: Diagnosis not present

## 2020-12-03 DIAGNOSIS — R5381 Other malaise: Secondary | ICD-10-CM | POA: Diagnosis not present

## 2020-12-03 DIAGNOSIS — K219 Gastro-esophageal reflux disease without esophagitis: Secondary | ICD-10-CM | POA: Diagnosis not present

## 2020-12-03 DIAGNOSIS — I10 Essential (primary) hypertension: Secondary | ICD-10-CM | POA: Diagnosis not present

## 2020-12-03 DIAGNOSIS — F325 Major depressive disorder, single episode, in full remission: Secondary | ICD-10-CM | POA: Diagnosis not present

## 2020-12-03 DIAGNOSIS — N183 Chronic kidney disease, stage 3 unspecified: Secondary | ICD-10-CM | POA: Diagnosis not present

## 2020-12-03 DIAGNOSIS — Z8673 Personal history of transient ischemic attack (TIA), and cerebral infarction without residual deficits: Secondary | ICD-10-CM | POA: Diagnosis not present

## 2021-01-01 DIAGNOSIS — E7800 Pure hypercholesterolemia, unspecified: Secondary | ICD-10-CM | POA: Diagnosis not present

## 2021-01-01 DIAGNOSIS — E1122 Type 2 diabetes mellitus with diabetic chronic kidney disease: Secondary | ICD-10-CM | POA: Diagnosis not present

## 2021-01-01 DIAGNOSIS — I129 Hypertensive chronic kidney disease with stage 1 through stage 4 chronic kidney disease, or unspecified chronic kidney disease: Secondary | ICD-10-CM | POA: Diagnosis not present

## 2021-01-01 DIAGNOSIS — N183 Chronic kidney disease, stage 3 unspecified: Secondary | ICD-10-CM | POA: Diagnosis not present

## 2021-01-20 DIAGNOSIS — R9721 Rising PSA following treatment for malignant neoplasm of prostate: Secondary | ICD-10-CM | POA: Diagnosis not present

## 2021-01-22 ENCOUNTER — Ambulatory Visit: Payer: PPO | Admitting: Podiatry

## 2021-01-29 DIAGNOSIS — C61 Malignant neoplasm of prostate: Secondary | ICD-10-CM | POA: Diagnosis not present

## 2021-01-29 DIAGNOSIS — R9721 Rising PSA following treatment for malignant neoplasm of prostate: Secondary | ICD-10-CM | POA: Diagnosis not present

## 2021-02-11 ENCOUNTER — Other Ambulatory Visit: Payer: Self-pay

## 2021-02-11 ENCOUNTER — Emergency Department (HOSPITAL_COMMUNITY): Payer: PPO

## 2021-02-11 ENCOUNTER — Encounter (HOSPITAL_COMMUNITY): Payer: Self-pay | Admitting: Pharmacy Technician

## 2021-02-11 ENCOUNTER — Inpatient Hospital Stay (HOSPITAL_COMMUNITY)
Admission: EM | Admit: 2021-02-11 | Discharge: 2021-02-24 | DRG: 177 | Disposition: A | Discharge status: Skilled Nursing Facility | Payer: PPO | Attending: Internal Medicine | Admitting: Internal Medicine

## 2021-02-11 DIAGNOSIS — I129 Hypertensive chronic kidney disease with stage 1 through stage 4 chronic kidney disease, or unspecified chronic kidney disease: Secondary | ICD-10-CM | POA: Diagnosis present

## 2021-02-11 DIAGNOSIS — J690 Pneumonitis due to inhalation of food and vomit: Secondary | ICD-10-CM | POA: Diagnosis not present

## 2021-02-11 DIAGNOSIS — Z79899 Other long term (current) drug therapy: Secondary | ICD-10-CM

## 2021-02-11 DIAGNOSIS — Z7984 Long term (current) use of oral hypoglycemic drugs: Secondary | ICD-10-CM

## 2021-02-11 DIAGNOSIS — R29818 Other symptoms and signs involving the nervous system: Secondary | ICD-10-CM | POA: Diagnosis not present

## 2021-02-11 DIAGNOSIS — Z882 Allergy status to sulfonamides status: Secondary | ICD-10-CM

## 2021-02-11 DIAGNOSIS — E1122 Type 2 diabetes mellitus with diabetic chronic kidney disease: Secondary | ICD-10-CM | POA: Diagnosis not present

## 2021-02-11 DIAGNOSIS — Z7189 Other specified counseling: Secondary | ICD-10-CM

## 2021-02-11 DIAGNOSIS — R29810 Facial weakness: Secondary | ICD-10-CM | POA: Diagnosis not present

## 2021-02-11 DIAGNOSIS — R531 Weakness: Secondary | ICD-10-CM | POA: Diagnosis present

## 2021-02-11 DIAGNOSIS — H9190 Unspecified hearing loss, unspecified ear: Secondary | ICD-10-CM | POA: Diagnosis present

## 2021-02-11 DIAGNOSIS — R109 Unspecified abdominal pain: Secondary | ICD-10-CM | POA: Diagnosis not present

## 2021-02-11 DIAGNOSIS — Z87891 Personal history of nicotine dependence: Secondary | ICD-10-CM

## 2021-02-11 DIAGNOSIS — E119 Type 2 diabetes mellitus without complications: Secondary | ICD-10-CM

## 2021-02-11 DIAGNOSIS — R1310 Dysphagia, unspecified: Secondary | ICD-10-CM | POA: Diagnosis not present

## 2021-02-11 DIAGNOSIS — R1311 Dysphagia, oral phase: Secondary | ICD-10-CM | POA: Diagnosis not present

## 2021-02-11 DIAGNOSIS — E860 Dehydration: Secondary | ICD-10-CM | POA: Diagnosis present

## 2021-02-11 DIAGNOSIS — U071 COVID-19: Secondary | ICD-10-CM | POA: Diagnosis not present

## 2021-02-11 DIAGNOSIS — R739 Hyperglycemia, unspecified: Secondary | ICD-10-CM | POA: Diagnosis not present

## 2021-02-11 DIAGNOSIS — I10 Essential (primary) hypertension: Secondary | ICD-10-CM | POA: Diagnosis present

## 2021-02-11 DIAGNOSIS — Z741 Need for assistance with personal care: Secondary | ICD-10-CM | POA: Diagnosis not present

## 2021-02-11 DIAGNOSIS — N183 Chronic kidney disease, stage 3 unspecified: Secondary | ICD-10-CM | POA: Diagnosis present

## 2021-02-11 DIAGNOSIS — R778 Other specified abnormalities of plasma proteins: Secondary | ICD-10-CM | POA: Diagnosis present

## 2021-02-11 DIAGNOSIS — R41841 Cognitive communication deficit: Secondary | ICD-10-CM | POA: Diagnosis not present

## 2021-02-11 DIAGNOSIS — Z7902 Long term (current) use of antithrombotics/antiplatelets: Secondary | ICD-10-CM

## 2021-02-11 DIAGNOSIS — I440 Atrioventricular block, first degree: Secondary | ICD-10-CM | POA: Diagnosis present

## 2021-02-11 DIAGNOSIS — K219 Gastro-esophageal reflux disease without esophagitis: Secondary | ICD-10-CM | POA: Diagnosis present

## 2021-02-11 DIAGNOSIS — N179 Acute kidney failure, unspecified: Secondary | ICD-10-CM | POA: Diagnosis not present

## 2021-02-11 DIAGNOSIS — F05 Delirium due to known physiological condition: Secondary | ICD-10-CM | POA: Diagnosis not present

## 2021-02-11 DIAGNOSIS — G928 Other toxic encephalopathy: Secondary | ICD-10-CM | POA: Diagnosis not present

## 2021-02-11 DIAGNOSIS — Z87442 Personal history of urinary calculi: Secondary | ICD-10-CM

## 2021-02-11 DIAGNOSIS — A419 Sepsis, unspecified organism: Secondary | ICD-10-CM

## 2021-02-11 DIAGNOSIS — Z7401 Bed confinement status: Secondary | ICD-10-CM | POA: Diagnosis not present

## 2021-02-11 DIAGNOSIS — R471 Dysarthria and anarthria: Secondary | ICD-10-CM | POA: Diagnosis not present

## 2021-02-11 DIAGNOSIS — Z66 Do not resuscitate: Secondary | ICD-10-CM | POA: Diagnosis present

## 2021-02-11 DIAGNOSIS — D631 Anemia in chronic kidney disease: Secondary | ICD-10-CM | POA: Diagnosis not present

## 2021-02-11 DIAGNOSIS — D649 Anemia, unspecified: Secondary | ICD-10-CM | POA: Diagnosis not present

## 2021-02-11 DIAGNOSIS — J189 Pneumonia, unspecified organism: Secondary | ICD-10-CM | POA: Diagnosis not present

## 2021-02-11 DIAGNOSIS — R0602 Shortness of breath: Secondary | ICD-10-CM

## 2021-02-11 DIAGNOSIS — Z825 Family history of asthma and other chronic lower respiratory diseases: Secondary | ICD-10-CM | POA: Diagnosis not present

## 2021-02-11 DIAGNOSIS — R079 Chest pain, unspecified: Secondary | ICD-10-CM | POA: Diagnosis not present

## 2021-02-11 DIAGNOSIS — R5381 Other malaise: Secondary | ICD-10-CM | POA: Diagnosis not present

## 2021-02-11 DIAGNOSIS — G9341 Metabolic encephalopathy: Secondary | ICD-10-CM | POA: Diagnosis present

## 2021-02-11 DIAGNOSIS — N1832 Chronic kidney disease, stage 3b: Secondary | ICD-10-CM | POA: Diagnosis present

## 2021-02-11 DIAGNOSIS — R6520 Severe sepsis without septic shock: Secondary | ICD-10-CM | POA: Diagnosis not present

## 2021-02-11 DIAGNOSIS — Z20822 Contact with and (suspected) exposure to covid-19: Secondary | ICD-10-CM | POA: Diagnosis not present

## 2021-02-11 DIAGNOSIS — Z9049 Acquired absence of other specified parts of digestive tract: Secondary | ICD-10-CM

## 2021-02-11 DIAGNOSIS — R5383 Other fatigue: Secondary | ICD-10-CM | POA: Diagnosis not present

## 2021-02-11 DIAGNOSIS — I6782 Cerebral ischemia: Secondary | ICD-10-CM | POA: Diagnosis not present

## 2021-02-11 DIAGNOSIS — M6259 Muscle wasting and atrophy, not elsewhere classified, multiple sites: Secondary | ICD-10-CM | POA: Diagnosis not present

## 2021-02-11 DIAGNOSIS — M549 Dorsalgia, unspecified: Secondary | ICD-10-CM | POA: Diagnosis not present

## 2021-02-11 DIAGNOSIS — E785 Hyperlipidemia, unspecified: Secondary | ICD-10-CM | POA: Diagnosis not present

## 2021-02-11 DIAGNOSIS — Z8546 Personal history of malignant neoplasm of prostate: Secondary | ICD-10-CM

## 2021-02-11 DIAGNOSIS — N1831 Chronic kidney disease, stage 3a: Secondary | ICD-10-CM | POA: Diagnosis not present

## 2021-02-11 DIAGNOSIS — Z8673 Personal history of transient ischemic attack (TIA), and cerebral infarction without residual deficits: Secondary | ICD-10-CM

## 2021-02-11 DIAGNOSIS — G9340 Encephalopathy, unspecified: Secondary | ICD-10-CM | POA: Diagnosis not present

## 2021-02-11 DIAGNOSIS — R0902 Hypoxemia: Secondary | ICD-10-CM | POA: Diagnosis not present

## 2021-02-11 DIAGNOSIS — D638 Anemia in other chronic diseases classified elsewhere: Secondary | ICD-10-CM | POA: Diagnosis not present

## 2021-02-11 DIAGNOSIS — M6289 Other specified disorders of muscle: Secondary | ICD-10-CM | POA: Diagnosis not present

## 2021-02-11 DIAGNOSIS — R0689 Other abnormalities of breathing: Secondary | ICD-10-CM | POA: Diagnosis not present

## 2021-02-11 DIAGNOSIS — R1110 Vomiting, unspecified: Secondary | ICD-10-CM | POA: Diagnosis not present

## 2021-02-11 DIAGNOSIS — R404 Transient alteration of awareness: Secondary | ICD-10-CM | POA: Diagnosis not present

## 2021-02-11 DIAGNOSIS — R2681 Unsteadiness on feet: Secondary | ICD-10-CM | POA: Diagnosis not present

## 2021-02-11 LAB — COMPREHENSIVE METABOLIC PANEL
ALT: 27 U/L (ref 0–44)
AST: 36 U/L (ref 15–41)
Albumin: 4 g/dL (ref 3.5–5.0)
Alkaline Phosphatase: 79 U/L (ref 38–126)
Anion gap: 12 (ref 5–15)
BUN: 24 mg/dL — ABNORMAL HIGH (ref 8–23)
CO2: 21 mmol/L — ABNORMAL LOW (ref 22–32)
Calcium: 9.7 mg/dL (ref 8.9–10.3)
Chloride: 104 mmol/L (ref 98–111)
Creatinine, Ser: 1.58 mg/dL — ABNORMAL HIGH (ref 0.61–1.24)
GFR, Estimated: 41 mL/min — ABNORMAL LOW (ref >60)
Glucose, Bld: 200 mg/dL — ABNORMAL HIGH (ref 70–99)
Potassium: 4.2 mmol/L (ref 3.5–5.1)
Sodium: 137 mmol/L (ref 135–145)
Total Bilirubin: 1.6 mg/dL — ABNORMAL HIGH (ref 0.3–1.2)
Total Protein: 6.5 g/dL (ref 6.5–8.1)

## 2021-02-11 LAB — URINALYSIS, ROUTINE W REFLEX MICROSCOPIC
Bilirubin Urine: NEGATIVE
Glucose, UA: NEGATIVE mg/dL
Ketones, ur: 5 mg/dL — AB
Leukocytes,Ua: NEGATIVE
Nitrite: NEGATIVE
Protein, ur: NEGATIVE mg/dL
Specific Gravity, Urine: 1.013 (ref 1.005–1.030)
pH: 5 (ref 5.0–8.0)

## 2021-02-11 LAB — TROPONIN I (HIGH SENSITIVITY)
Troponin I (High Sensitivity): 13 ng/L (ref <18)
Troponin I (High Sensitivity): 19 ng/L — ABNORMAL HIGH (ref <18)

## 2021-02-11 LAB — CBG MONITORING, ED: Glucose-Capillary: 193 mg/dL — ABNORMAL HIGH (ref 70–99)

## 2021-02-11 LAB — LIPASE, BLOOD: Lipase: 31 U/L (ref 11–51)

## 2021-02-11 LAB — CBC WITH DIFFERENTIAL/PLATELET
Abs Immature Granulocytes: 0.07 10*3/uL (ref 0.00–0.07)
Basophils Absolute: 0 10*3/uL (ref 0.0–0.1)
Basophils Relative: 0 %
Eosinophils Absolute: 0 10*3/uL (ref 0.0–0.5)
Eosinophils Relative: 0 %
HCT: 31.3 % — ABNORMAL LOW (ref 39.0–52.0)
Hemoglobin: 10.3 g/dL — ABNORMAL LOW (ref 13.0–17.0)
Immature Granulocytes: 1 %
Lymphocytes Relative: 3 %
Lymphs Abs: 0.5 10*3/uL — ABNORMAL LOW (ref 0.7–4.0)
MCH: 32.8 pg (ref 26.0–34.0)
MCHC: 32.9 g/dL (ref 30.0–36.0)
MCV: 99.7 fL (ref 80.0–100.0)
Monocytes Absolute: 0.8 10*3/uL (ref 0.1–1.0)
Monocytes Relative: 5 %
Neutro Abs: 14.1 10*3/uL — ABNORMAL HIGH (ref 1.7–7.7)
Neutrophils Relative %: 91 %
Platelets: 152 10*3/uL (ref 150–400)
RBC: 3.14 MIL/uL — ABNORMAL LOW (ref 4.22–5.81)
RDW: 14 % (ref 11.5–15.5)
WBC: 15.5 10*3/uL — ABNORMAL HIGH (ref 4.0–10.5)
nRBC: 0 % (ref 0.0–0.2)

## 2021-02-11 LAB — LACTIC ACID, PLASMA
Lactic Acid, Venous: 1.8 mmol/L (ref 0.5–1.9)
Lactic Acid, Venous: 1.3 mmol/L (ref 0.5–1.9)

## 2021-02-11 LAB — RESP PANEL BY RT-PCR (FLU A&B, COVID) ARPGX2
Influenza A by PCR: NEGATIVE
Influenza B by PCR: NEGATIVE
SARS Coronavirus 2 by RT PCR: NEGATIVE

## 2021-02-11 MED ORDER — ACETAMINOPHEN 650 MG RE SUPP: 650.0000 mg | Freq: Four times a day (QID) | RECTAL | Status: DC | PRN

## 2021-02-11 MED ORDER — SODIUM CHLORIDE 0.9 % IV SOLN
2.0000 g | Freq: Once | INTRAVENOUS | Status: AC
  Administered 2021-02-11: 2 g via INTRAVENOUS
  Filled 2021-02-11: qty 2

## 2021-02-11 MED ORDER — SODIUM CHLORIDE 0.9 % IV SOLN: 2.0000 g | Freq: Three times a day (TID) | INTRAVENOUS | Status: DC

## 2021-02-11 MED ORDER — IOHEXOL 350 MG/ML SOLN
75.0000 mL | Freq: Once | INTRAVENOUS | Status: AC | PRN
  Administered 2021-02-11: 75 mL via INTRAVENOUS

## 2021-02-11 MED ORDER — ACETAMINOPHEN 325 MG PO TABS
650.0000 mg | ORAL_TABLET | Freq: Once | ORAL | Status: AC
  Administered 2021-02-11: 650 mg via ORAL
  Filled 2021-02-11: qty 2

## 2021-02-11 MED ORDER — ATORVASTATIN CALCIUM 40 MG PO TABS
40.0000 mg | ORAL_TABLET | Freq: Every day | ORAL | Status: DC
  Administered 2021-02-12 – 2021-02-24 (×13): 40 mg via ORAL
  Filled 2021-02-11 (×13): qty 1

## 2021-02-11 MED ORDER — LACTATED RINGERS IV SOLN: INTRAVENOUS | Status: DC

## 2021-02-11 MED ORDER — VANCOMYCIN HCL 1500 MG/300ML IV SOLN
1500.0000 mg | Freq: Once | INTRAVENOUS | Status: AC
  Administered 2021-02-11: 1500 mg via INTRAVENOUS
  Filled 2021-02-11: qty 300

## 2021-02-11 MED ORDER — INSULIN ASPART 100 UNIT/ML IJ SOLN
0.0000 [IU] | Freq: Three times a day (TID) | INTRAMUSCULAR | Status: DC
  Administered 2021-02-13 – 2021-02-16 (×8): 1 [IU] via SUBCUTANEOUS
  Administered 2021-02-16 – 2021-02-17 (×2): 2 [IU] via SUBCUTANEOUS
  Administered 2021-02-18 – 2021-02-19 (×6): 1 [IU] via SUBCUTANEOUS
  Administered 2021-02-20: 2 [IU] via SUBCUTANEOUS
  Administered 2021-02-20 – 2021-02-21 (×3): 1 [IU] via SUBCUTANEOUS
  Administered 2021-02-21: 2 [IU] via SUBCUTANEOUS
  Administered 2021-02-22: 4 [IU] via SUBCUTANEOUS
  Administered 2021-02-22: 1 [IU] via SUBCUTANEOUS
  Administered 2021-02-22: 3 [IU] via SUBCUTANEOUS
  Administered 2021-02-23: 1 [IU] via SUBCUTANEOUS
  Administered 2021-02-23: 3 [IU] via SUBCUTANEOUS
  Administered 2021-02-23: 2 [IU] via SUBCUTANEOUS
  Administered 2021-02-24: 1 [IU] via SUBCUTANEOUS
  Administered 2021-02-24: 2 [IU] via SUBCUTANEOUS
  Administered 2021-02-24: 1 [IU] via SUBCUTANEOUS

## 2021-02-11 MED ORDER — METRONIDAZOLE 500 MG/100ML IV SOLN
500.0000 mg | Freq: Once | INTRAVENOUS | Status: AC
  Administered 2021-02-11: 500 mg via INTRAVENOUS
  Filled 2021-02-11: qty 100

## 2021-02-11 MED ORDER — SODIUM CHLORIDE 0.9 % IV BOLUS
1000.0000 mL | Freq: Once | INTRAVENOUS | Status: AC
  Administered 2021-02-11: 1000 mL via INTRAVENOUS

## 2021-02-11 MED ORDER — VANCOMYCIN HCL 750 MG/150ML IV SOLN: 750.0000 mg | INTRAVENOUS | Status: DC

## 2021-02-11 MED ORDER — ACETAMINOPHEN 325 MG PO TABS
650.0000 mg | ORAL_TABLET | Freq: Four times a day (QID) | ORAL | Status: DC | PRN
  Administered 2021-02-12 – 2021-02-24 (×8): 650 mg via ORAL
  Filled 2021-02-11 (×8): qty 2

## 2021-02-11 MED ORDER — SODIUM CHLORIDE 0.9 % IV SOLN
2.0000 g | Freq: Two times a day (BID) | INTRAVENOUS | Status: DC
  Administered 2021-02-12: 2 g via INTRAVENOUS
  Filled 2021-02-11 (×2): qty 2

## 2021-02-11 NOTE — ED Provider Notes (Signed)
Emergency Medicine Provider Triage Evaluation Note  Tyler [name] , a 87 y.o. male  was evaluated in triage.  Pt complains of generalized weakness and pain all over. Pt is difficult to understand therefore history is limited. Had an episode of vomiting pta.  Review of Systems  Positive: Weakness, pain all over, nv Negative: sob  Physical Exam  BP (!) 149/62 (BP Location: Left Arm)   Pulse 97   Temp 97.7 F (36.5 C) (Oral)   Resp 20   SpO2 97%  Gen:   Awake, no distress   Resp:  Normal effort  MSK:   Moves extremities without difficulty  Other:  Abd ttp noted diffusely, heart murmur noted  Medical Decision Making  Medically screening exam initiated at 5:52 PM.  Appropriate orders placed.  Annabelle Harman was informed that the remainder of the evaluation will be completed by another provider, this initial triage assessment does not replace that evaluation, and the importance of remaining in the ED until their evaluation is complete.     Bishop Dublin 02/11/21 1754    Margette Fast, MD 02/11/21 2208

## 2021-02-11 NOTE — ED Provider Notes (Signed)
[facility] EMERGENCY DEPARTMENT Provider Note   CSN: [id number] Arrival date & time: 02/11/21  1741  LEVEL 5 CAVEAT - HARD OF HEARING  History Chief Complaint  Patient presents with   Weakness    Tyler [name] is a 87 y.o. male.  HPI 87 year old male presents with generalized weakness.  Patient is very hard of hearing and difficult to get a history from.  He is able to tell me that he is hurting in his head, throat, and all over.  Indicates chest and abdominal and back pain.  Wife tells me over the phone that he has been generally weak and refusing to walk today due to pain and weakness.  She thinks he is dehydrated.  He thought he was trying to kill her when she tried to give him his meds today.  She feels like he is confused today.  She did a COVID test at home which was negative for him.  Most recently got flu and COVID vaccines a couple weeks ago.  Past Medical History:  Diagnosis Date   Cancer [facility])    Prostate   Diabetes mellitus without complication ([place])    Hypertension    Kidney stones    Stroke [facility])     Patient Active Problem List   Diagnosis Date Noted   Acute metabolic encephalopathy 54/27/0623   Fracture of femoral neck, left, closed ([place]) 10/12/2018   Hip pain 10/06/2018   Low back pain 10/06/2018   Closed nondisplaced fracture of pelvis ([place]) 08/22/2018   Labile blood pressure    Panic attack    Anxiety state    Labile blood glucose    Benign essential HTN    Stage 3 chronic kidney disease (HCC)    Dizziness and giddiness    Numbness    Acute blood loss anemia    AKI (acute kidney injury) ([place])    Diabetes mellitus type 2 in nonobese [facility])    Pelvic fracture ([place]) 06/15/2018   Pain in joint involving right pelvic region and thigh    Thrombocytopenia ([place])    Diabetes mellitus type 2 in obese ([place])    Anemia of chronic disease    Type II diabetes mellitus with renal manifestations ([place]) 06/12/2018   Fall 06/12/2018   Closed  fracture of pubic ramus, right, initial encounter ([place]) 06/12/2018   Leukocytosis 06/12/2018   Closed fracture of right pubis, initial encounter ([place]) 06/12/2018   TIA (transient ischemic attack) 06/28/2016   CKD (chronic kidney disease), stage III ([place]) 06/28/2016   Microscopic hematuria 06/28/2016   Femur fracture, right ([place]) 11/16/2015   History of nephrolithiasis 11/16/2015   Pseudobulbar affect 06/07/2014   Essential hypertension    Memory loss    Speech abnormality 01/28/2012   Cerebral embolism with cerebral infarction ([place]) 01/28/2012   History of stroke 01/28/2012   Type 2 diabetes mellitus without complication, without long-term current use of insulin ([place]) 01/28/2012   Hyperlipidemia 01/28/2012   History of prostate cancer 01/28/2012    Past Surgical History:  Procedure Laterality Date   CARDIAC CATHETERIZATION     cyst surgery on back     removed from end of spine 50 years ago   FEMUR IM NAIL Right 11/17/2015   Procedure: INTRAMEDULLARY (IM) NAIL FEMORAL;  Surgeon: Marybelle Killings, MD;  Location: [place];  Service: Orthopedics;  Laterality: Right;   HIP PINNING,CANNULATED Left 10/12/2018   Procedure: Left hip pinning;  Surgeon: Gaynelle Arabian, MD;  Location: [facility];  Service: Orthopedics;  Laterality: Left;  37min   KNEE ARTHROSCOPY     both knees   PROSTATE SURGERY     SHOULDER SURGERY     right shoulder       Family History  Problem Relation Age of Onset   Emphysema Father    Heart Problems Mother     Social History   Tobacco Use   Smoking status: Former    Types: Cigarettes    Quit date: 05/04/1968    Years since quitting: 52.8   Smokeless tobacco: Never  Vaping Use   Vaping Use: Never used  Substance Use Topics   Alcohol use: No   Drug use: No    Home Medications Prior to Admission medications   Medication Sig Start Date End Date Taking? Authorizing Provider  acetaminophen (TYLENOL) 325 MG tablet Take 2 tablets (650 mg total) by mouth every 6 (six)  hours. Patient taking differently: Take 650 mg by mouth every 6 (six) hours as needed (pain.).  06/27/18   Love, Ivan Anchors, PA-C  amLODipine (NORVASC) 2.5 MG tablet Take 1 tablet (2.5 mg total) by mouth at bedtime. Patient taking differently: Take 2.5 mg by mouth daily.  06/27/18   Love, Ivan Anchors, PA-C  atorvastatin (LIPITOR) 20 MG tablet Take 1 tablet (20 mg total) by mouth daily. Patient taking differently: Take 20 mg by mouth at bedtime.  06/27/18   Love, Ivan Anchors, PA-C  atorvastatin (LIPITOR) 40 MG tablet Take 40 mg by mouth daily. 11/11/19   [provider]  camphor-menthol Timoteo Ace) lotion Apply topically as needed for itching. 06/27/18   Love, Ivan Anchors, PA-C  Cholecalciferol (VITAMIN D3) 50 MCG (2000 UT) TABS Take 2,000 Units by mouth daily.    [provider]  clopidogrel (PLAVIX) 75 MG tablet Take 1 tablet (75 mg total) by mouth daily with breakfast. 03/20/15   Dennie Bible, NP  docusate sodium (COLACE) 100 MG capsule Take 200 mg by mouth at bedtime.    [provider]  glimepiride (AMARYL) 2 MG tablet Take 1 mg by mouth daily with breakfast. 10/04/18   [provider]  Lancets (ONETOUCH ULTRASOFT) lancets  07/07/12   [provider]  losartan (COZAAR) 50 MG tablet Take 1 tablet (50 mg total) by mouth daily. Patient taking differently: Take 50 mg by mouth every evening.  06/27/18   Love, Ivan Anchors, PA-C  Melatonin 5 MG TABS Take 5 mg by mouth at bedtime.    [provider]  methocarbamol (ROBAXIN) 500 MG tablet Take 1 tablet (500 mg total) by mouth every 6 (six) hours as needed for muscle spasms. 10/18/18   Edmisten, Kristie L, PA  mirtazapine (REMERON) 15 MG tablet Take 15 mg by mouth at bedtime. 11/11/19   [provider]  Multiple Vitamin (MULTIVITAMIN WITH MINERALS) TABS tablet Take 1 tablet by mouth daily. Patient not taking: Reported on 10/11/2018 06/28/18   Bary Leriche, PA-C  ONE TOUCH ULTRA TEST test strip  09/06/12    [provider]  pantoprazole (PROTONIX) 40 MG tablet Take 1 tablet (40 mg total) by mouth daily. 06/27/18   Love, Ivan Anchors, PA-C  sertraline (ZOLOFT) 50 MG tablet Take 100 mg by mouth daily. 04/29/18   [provider]  TOLAK 4 % CREA Apply topically. 10/30/19   [provider]  vitamin B-12 (CYANOCOBALAMIN) 1000 MCG tablet Take 1,000 mcg by mouth at bedtime.    [provider]    Allergies  Sulfa drugs cross reactors  Review of Systems   Review of Systems  Unable to perform ROS: Other   Physical Exam Updated Vital Signs BP 139/65   Pulse 87   Temp (!) 100.5 F (38.1 C) (Rectal)   Resp 18   SpO2 93%   Physical Exam Vitals and nursing note reviewed.  Constitutional:      General: He is not in acute distress.    Appearance: He is well-developed.  HENT:     Head: Normocephalic and atraumatic.     Right Ear: External ear normal.     Left Ear: External ear normal.     Nose: Nose normal.     Mouth/Throat:     Mouth: Mucous membranes are dry.  Eyes:     General:        Right eye: No discharge.        Left eye: No discharge.     Pupils: Pupils are equal, round, and reactive to light.  Cardiovascular:     Rate and Rhythm: Normal rate and regular rhythm.     Heart sounds: Murmur heard.  Pulmonary:     Effort: Pulmonary effort is normal.     Breath sounds: Normal breath sounds.  Abdominal:     Palpations: Abdomen is soft.     Tenderness: There is generalized abdominal tenderness. There is right CVA tenderness and left CVA tenderness.  Musculoskeletal:     Cervical back: Normal range of motion and neck supple.  Skin:    General: Skin is warm and dry.  Neurological:     Mental Status: He is alert.     Comments: Awake, alert, very hard of hearing. 5/5 strength in BUE, 4/5 strength BLE.  Psychiatric:        Mood and Affect: Mood is not anxious.    ED Results / Procedures / Treatments   Labs (all labs ordered are listed, but only  abnormal results are displayed) Labs Reviewed  CBC WITH DIFFERENTIAL/PLATELET - Abnormal; Notable for the following components:      Result Value   WBC 15.5 (*)    RBC 3.14 (*)    Hemoglobin 10.3 (*)    HCT 31.3 (*)    Neutro Abs 14.1 (*)    Lymphs Abs 0.5 (*)    All other components within normal limits  COMPREHENSIVE METABOLIC PANEL - Abnormal; Notable for the following components:   CO2 21 (*)    Glucose, Bld 200 (*)    BUN 24 (*)    Creatinine, Ser 1.58 (*)    Total Bilirubin 1.6 (*)    GFR, Estimated 41 (*)    All other components within normal limits  URINALYSIS, ROUTINE W REFLEX MICROSCOPIC - Abnormal; Notable for the following components:   Hgb urine dipstick SMALL (*)    Ketones, ur 5 (*)    Bacteria, UA RARE (*)    All other components within normal limits  CBG MONITORING, ED - Abnormal; Notable for the following components:   Glucose-Capillary 193 (*)    All other components within normal limits  TROPONIN I (HIGH SENSITIVITY) - Abnormal; Notable for the following components:   Troponin I (High Sensitivity) 19 (*)    All other components within normal limits  RESP PANEL BY RT-PCR (FLU A&B, COVID) ARPGX2  CULTURE, BLOOD (ROUTINE X 2)  CULTURE, BLOOD (ROUTINE X 2)  URINE CULTURE  LIPASE, BLOOD  LACTIC ACID, PLASMA  LACTIC ACID, PLASMA  MAGNESIUM  MAGNESIUM  COMPREHENSIVE METABOLIC  PANEL  CBC WITH DIFFERENTIAL/PLATELET  TROPONIN I (HIGH SENSITIVITY)    EKG EKG Interpretation  Date/Time:  Tuesday February 11 2021 17:44:39 EDT Ventricular Rate:  97 PR Interval:  268 QRS Duration: 92 QT Interval:  342 QTC Calculation: 434 R Axis:   -49 Text Interpretation: Sinus rhythm with 1st degree A-V block Left anterior fascicular block Minimal voltage criteria for LVH, may be normal variant ( R in aVL ) Abnormal ECG Confirmed by Sherwood Gambler [id number]) on 02/11/2021 6:19:50 PM  Radiology CT HEAD WO CONTRAST (5MM)  Result Date: 02/11/2021 CLINICAL DATA:  Weakness,  fatigue EXAM: CT HEAD WITHOUT CONTRAST TECHNIQUE: Contiguous axial images were obtained from the base of the skull through the vertex without intravenous contrast. COMPARISON:  10/18/2018 FINDINGS: Brain: Chronic small vessel ischemic changes are again seen throughout the periventricular white matter, stable. No signs of acute infarct or hemorrhage. Lateral ventricles and remaining midline structures are unremarkable. There are no acute extra-axial fluid collections. No mass effect. Vascular: No hyperdense vessel or unexpected calcification. Skull: Normal. Negative for fracture or focal lesion. Sinuses/Orbits: Mild mucosal thickening within the ethmoid air cells. Remaining paranasal sinuses are clear. Other: None. IMPRESSION: 1. Stable chronic ischemic changes as above. No acute intracranial process. Electronically Signed   By: Randa Ngo M.D.   On: 02/11/2021 20:44   CT ABDOMEN PELVIS W CONTRAST  Result Date: 02/11/2021 CLINICAL DATA:  Nausea, vomiting, anorexia, oliguria. Abdominal pain and back pain. EXAM: CT ABDOMEN AND PELVIS WITH CONTRAST TECHNIQUE: Multidetector CT imaging of the abdomen and pelvis was performed using the standard protocol following bolus administration of intravenous contrast. CONTRAST:  27mL OMNIPAQUE IOHEXOL 350 MG/ML SOLN COMPARISON:  07/12/2020 FINDINGS: Lower chest: Patchy infiltrate is seen within the a posterior basal right lower lobe, possibly infectious or inflammatory in nature. Mild left basilar atelectasis. No pleural effusion. Cardiac size within normal limits. No pericardial effusion. Hepatobiliary: No focal liver abnormality is seen. Status post cholecystectomy. No biliary dilatation. Pancreas: Unremarkable Spleen: Unremarkable Adrenals/Urinary Tract: The adrenal glands are unremarkable. The kidneys are normal in size and position. Stable 7 mm nonobstructing calculus within the lower pole the left kidney. No ureteral calculi. No hydronephrosis. Multiple simple  bilateral cortical cysts are again identified. There is a stable 20 mm partially exophytic low-attenuation lesion arising from the upper pole of the right kidney which may represent a hyperdense renal cyst or cystic renal mass, but is not well characterized on this single-phase examination. This appears stable, however, since remote prior examination of 01/22/2015 and its stability over time favors a benign lesion. The bladder is unremarkable. Stomach/Bowel: Stomach is within normal limits. Appendix appears normal. No evidence of bowel wall thickening, distention, or inflammatory changes. Vascular/Lymphatic: Aortic atherosclerosis. No enlarged abdominal or pelvic lymph nodes. Reproductive: Prostatectomy and pelvic lymph node dissection has been performed. No recurrent mass lesion. Other: No abdominal wall hernia.  Rectum unremarkable. Musculoskeletal: Bilateral hip ORIF has been performed. Healed fractures the right superior and inferior pubic rami noted. Degenerative changes are seen within the lumbar spine. No acute bone abnormality. No lytic or blastic bone lesion. IMPRESSION: No acute intra-abdominal pathology identified. No definite radiographic explanation for the patient's reported symptoms. Patchy infiltrate within the visualized right lung base, possibly infectious or inflammatory. Stable mild left nonobstructing nephrolithiasis. Indeterminate low-attenuation exophytic lesion arising from the upper pole of the right kidney, stable since prior examination of 01/22/2015. Its stability over time, however, favors a hyperdense renal cyst. Aortic Atherosclerosis (ICD10-I70.0). Electronically Signed   By:  Fidela Salisbury M.D.   On: 02/11/2021 20:55   DG Chest Portable 1 View  Result Date: 02/11/2021 CLINICAL DATA:  Weakness, chest pain EXAM: PORTABLE CHEST 1 VIEW COMPARISON:  Chest x-ray 10/14/2018 FINDINGS: The heart and mediastinal contours are within normal limits. Vague right perihilar airspace density may  be due to rotation. No pulmonary edema. No pleural effusion. No pneumothorax. No acute osseous abnormality. IMPRESSION: Low lung volumes with vague right perihilar airspace density may be due to rotation. Otherwise no acute cardiopulmonary abnormality. Electronically Signed   By: Iven Finn M.D.   On: 02/11/2021 19:15    Procedures Procedures   Medications Ordered in ED Medications  vancomycin (VANCOREADY) IVPB 1500 mg/300 mL (1,500 mg Intravenous New Bag/Given 02/11/21 2113)  vancomycin (VANCOREADY) IVPB 750 mg/150 mL (has no administration in time range)  ceFEPIme (MAXIPIME) 2 g in sodium chloride 0.9 % 100 mL IVPB (has no administration in time range)  acetaminophen (TYLENOL) tablet 650 mg (has no administration in time range)    Or  acetaminophen (TYLENOL) suppository 650 mg (has no administration in time range)  sodium chloride 0.9 % bolus 1,000 mL (0 mLs Intravenous Stopped 02/11/21 1928)  acetaminophen (TYLENOL) tablet 650 mg (650 mg Oral Given 02/11/21 1928)  ceFEPIme (MAXIPIME) 2 g in sodium chloride 0.9 % 100 mL IVPB (0 g Intravenous Stopped 02/11/21 1934)  metroNIDAZOLE (FLAGYL) IVPB 500 mg (0 mg Intravenous Stopped 02/11/21 1950)  iohexol (OMNIPAQUE) 350 MG/ML injection 75 mL (75 mLs Intravenous Contrast Given 02/11/21 2033)    ED Course  I have reviewed the triage vital signs and the nursing notes.  Pertinent labs & imaging results that were available during my care of the patient were reviewed by me and considered in my medical decision making (see chart for details).    MDM Rules/Calculators/A&P                           Patient seems mildly altered based on discussion with wife.  Lab work does show a leukocytosis though no obvious indication of sepsis.  However it seems like he might be a little encephalopathic from an acute infection from pneumonia based on chest x-ray and CT findings.  Given his diffuse pain, especially with abdominal findings, CTs were obtained  but did not show any new pathology.  Thus he will be treated with antibiotics and admitted to the hospitalist service.  He can likely be scaled back on antibiotics now that his source has been found. Final Clinical Impression(s) / ED Diagnoses Final diagnoses:  Community acquired pneumonia of right lung, unspecified part of lung    Rx / DC Orders ED Discharge Orders     None        Sherwood Gambler, MD 02/11/21 2155

## 2021-02-11 NOTE — Progress Notes (Signed)
[facility] is following sepsis bundle. 

## 2021-02-11 NOTE — ED Notes (Addendum)
BIB  EMS, to room by w/c from triage, generally significantly weak. EDP at [facility]. Alert, NAD, calm, interactive.

## 2021-02-11 NOTE — H&P (Addendum)
History and Physical    PLEASE NOTE THAT [employer]AGON DICTATION SOFTWARE WAS USED IN THE CONSTRUCTION OF THIS NOTE.   Tyler [name] DOB: 01-10-1929 DOA: 02/11/2021  PCP: [name], MD Patient coming from: home   I have personally briefly reviewed patient's old medical records in [facility]  Chief Complaint: altered mental status  HPI: Tyler [name] is a 87 y.o. male with medical history significant for type 2 diabetes mellitus, essential hypertension, hyperlipidemia, stage IIIa chronic kidney disease with baseline creatinine reported 1.7, anemia of chronic disease with baseline hemoglobin 9-12, who is admitted to [facility] on 02/11/2021 with acute metabolic encephalopathy after presenting from home to [facility] ED for evaluation of altered mental status.   In the setting of patient's altered mental status, the following history is provided by the patient's wife in addition to my discussions with the EDP and via chart review.  In the context of report of suspected mild underlying dementia, wife reports that the patient has exhibited worsening confusion superimposed on this baseline mental status over the last 2 days.  This is been associated with a degree of paranoia, with the patient asking his wife on multiple occasions over the last 48 hours if she is trying to poison him.  He also notes that the patient has been more verbally aggressive and baseline over the last 2 days, noting that she has yelled at him multiple times over that timeframe, which is a distinct change from his baseline demeanor.  She conveys that the patient has been without recent acute complaint, including no recent complaint of chest pain.  She has not noted him to exhibit any evidence of cough, nausea, vomiting, diarrhea.  Has not noted any acute rash.  No recent preceding trauma or fall.  She does not believe that he has undergone any recent changes to his home medication regimen.  Not associate with  any generalized tonic-clonic activity, tongue biting, or loss of bowel/bladder function.  Wife confirms that the patient lives at home with her.   He also notes that the patient has appeared fatigued and weak in a general sense, in the absence of any acute focal weakness, acute focal numbness, paresthesias, facial droop, slurred speech, expressive aphasia, acute change in vision, dysphagia, vertigo.  Medical history notable for stage IIIa chronic kidney disease with baseline creatinine 1.4-1.7, with most recent prior serum creatinine data point noted to be 1.39 in June 2020.    ED Course:  Vital signs in the ED were notable for the following: Temperature max 100.5, heart rate 87-97; blood pressure 128/76 -165/67; respiratory rate 18-25, oxygen saturation 93 to 97% on room air.  Labs were notable for the following: CMP notable for the following: Bicarbonate 21, anion gap 12, BUN 24, creatinine 1.58, glucose 200, calcium 9.7, and liver enzymes were found to be within normal limits.  High-sensitivity troponin I initially noted to be 13 With repeat value trending up slightly to 19.  CBC notable for white blood cell count 15,500 with 91% neutrophils, hemoglobin 10.3 unchanged from most recent prior value in June 2020.  Initial lactic acid 1.8, repeat value trending down to 1.3.  Urinalysis showed no white blood cells, leukocyte Estrace negative, nitrate negative, small hemoglobin without evidence of red blood cells.  COVID-19/influenza PCR were checked in the ED today and found to be negative.  Blood cultures x2 were collected prior to initiation of IV antibiotics.  Imaging and additional notable ED work-up: EKG showed sinus  rhythm with first-degree AV block, heart rate 97, PR interval 268, and no evidence of T wave or ST changes, including no evidence of ST elevation.  Chest x-ray showed evidence of right perihilar airspace opacity concerning for infiltrate, without evidence of edema, effusion, or  pneumothorax.  Noncontrast CT of the head showed no evidence of acute intracranial process, clean no evidence of intracranial hemorrhage.  CT abdomen/pelvis, which was ordered by EDP to further assess for any additional underlying infectious source in the setting of presenting severe sepsis, demonstrated no evidence of acute intra-abdominal or acute intrapelvic abnormality, but did show evidence of patchy infiltrate at the right lung base concerning for infection.  While in the ED, the following were administered: Acetaminophen 650 mg p.o. x1, cefepime, IV vancomycin, and IV Flagyl.  Additionally, he received a 1 L normal saline bolus.  Subsequently, the patient is admitted to the med telemetry unit for further evaluation management of acute metabolic encephalopathy in the setting of severe sepsis due to right-sided pneumonia.     Review of Systems: As per HPI otherwise 10 point review of systems negative.   Past Medical History:  Diagnosis Date   Cancer [facility])    Prostate   Diabetes mellitus without complication ([place])    Hypertension    Kidney stones    Stroke [facility])     Past Surgical History:  Procedure Laterality Date   CARDIAC CATHETERIZATION     cyst surgery on back     removed from end of spine 50 years ago   FEMUR IM NAIL Right 11/17/2015   Procedure: INTRAMEDULLARY (IM) NAIL FEMORAL;  Surgeon: Marybelle Killings, MD;  Location: [place];  Service: Orthopedics;  Laterality: Right;   HIP PINNING,CANNULATED Left 10/12/2018   Procedure: Left hip pinning;  Surgeon: Gaynelle Arabian, MD;  Location: [facility];  Service: Orthopedics;  Laterality: Left;  9min   KNEE ARTHROSCOPY     both knees   PROSTATE SURGERY     SHOULDER SURGERY     right shoulder    Social History:  reports that he quit smoking about 52 years ago. He has never used smokeless tobacco. He reports that he does not drink alcohol and does not use drugs.   Allergies  Allergen Reactions   Sulfa Drugs Cross Reactors Anaphylaxis  and Rash    Lungs fill with fluid    Family History  Problem Relation Age of Onset   Emphysema Father    Heart Problems Mother     Family history reviewed and not pertinent    Prior to Admission medications   Medication Sig Start Date End Date Taking? Authorizing Provider  acetaminophen (TYLENOL) 325 MG tablet Take 2 tablets (650 mg total) by mouth every 6 (six) hours. Patient taking differently: Take 650 mg by mouth every 6 (six) hours as needed (pain.).  06/27/18   Love, Ivan Anchors, PA-C  amLODipine (NORVASC) 2.5 MG tablet Take 1 tablet (2.5 mg total) by mouth at bedtime. Patient taking differently: Take 2.5 mg by mouth daily.  06/27/18   Love, Ivan Anchors, PA-C  atorvastatin (LIPITOR) 20 MG tablet Take 1 tablet (20 mg total) by mouth daily. Patient taking differently: Take 20 mg by mouth at bedtime.  06/27/18   Love, Ivan Anchors, PA-C  atorvastatin (LIPITOR) 40 MG tablet Take 40 mg by mouth daily. 11/11/19   [provider]  camphor-menthol Timoteo Ace) lotion Apply topically as needed for itching. 06/27/18   Love, Ivan Anchors, PA-C  Cholecalciferol (VITAMIN D3)  50 MCG (2000 UT) TABS Take 2,000 Units by mouth daily.    [provider]  clopidogrel (PLAVIX) 75 MG tablet Take 1 tablet (75 mg total) by mouth daily with breakfast. 03/20/15   Dennie Bible, NP  docusate sodium (COLACE) 100 MG capsule Take 200 mg by mouth at bedtime.    [provider]  glimepiride (AMARYL) 2 MG tablet Take 1 mg by mouth daily with breakfast. 10/04/18   [provider]  Lancets (ONETOUCH ULTRASOFT) lancets  07/07/12   [provider]  losartan (COZAAR) 50 MG tablet Take 1 tablet (50 mg total) by mouth daily. Patient taking differently: Take 50 mg by mouth every evening.  06/27/18   Love, Ivan Anchors, PA-C  Melatonin 5 MG TABS Take 5 mg by mouth at bedtime.    [provider]  methocarbamol (ROBAXIN) 500 MG tablet Take 1 tablet (500 mg total) by mouth every 6 (six) hours as  needed for muscle spasms. 10/18/18   Edmisten, Kristie L, PA  mirtazapine (REMERON) 15 MG tablet Take 15 mg by mouth at bedtime. 11/11/19   [provider]  Multiple Vitamin (MULTIVITAMIN WITH MINERALS) TABS tablet Take 1 tablet by mouth daily. Patient not taking: Reported on 10/11/2018 06/28/18   Bary Leriche, PA-C  ONE TOUCH ULTRA TEST test strip  09/06/12   [provider]  pantoprazole (PROTONIX) 40 MG tablet Take 1 tablet (40 mg total) by mouth daily. 06/27/18   Love, Ivan Anchors, PA-C  sertraline (ZOLOFT) 50 MG tablet Take 100 mg by mouth daily. 04/29/18   [provider]  TOLAK 4 % CREA Apply topically. 10/30/19   [provider]  vitamin B-12 (CYANOCOBALAMIN) 1000 MCG tablet Take 1,000 mcg by mouth at bedtime.    [provider]     Objective    Physical Exam: Vitals:   02/11/21 1915 02/11/21 1945 02/11/21 2015 02/11/21 2112  BP: (!) 155/66 133/60 140/60 139/65  Pulse: 95 91 88 87  Resp: (!) 21 18 19 18   Temp:      TempSrc:      SpO2: 98% 93% 96% 93%    General: appears to be stated age; alert, confused Skin: warm, dry, no rash Head:  AT/[place] Mouth:  Oral mucosa membranes appear moist, normal dentition Neck: supple; trachea midline Heart:  RRR; did not appreciate any M/R/G Lungs: CTAB, did not appreciate any wheezes, rales, or rhonchi Abdomen: + BS; soft, ND, NT Vascular: 2+ pedal pulses b/l; 2+ radial pulses b/l Extremities: no peripheral edema, no muscle wasting Neuro: strength and sensation intact in upper and lower extremities b/l    Labs on Admission: I have personally reviewed following labs and imaging studies  CBC: Recent Labs  Lab 02/11/21 1830  WBC 15.5*  NEUTROABS 14.1*  HGB 10.3*  HCT 31.3*  MCV 99.7  PLT 620   Basic Metabolic Panel: Recent Labs  Lab 02/11/21 1830  NA 137  K 4.2  CL 104  CO2 21*  GLUCOSE 200*  BUN 24*  CREATININE 1.58*  CALCIUM 9.7   GFR: CrCl cannot be calculated (Unknown ideal  weight.). Liver Function Tests: Recent Labs  Lab 02/11/21 1830  AST 36  ALT 27  ALKPHOS 79  BILITOT 1.6*  PROT 6.5  ALBUMIN 4.0   Recent Labs  Lab 02/11/21 1830  LIPASE 31   No results for input(s): AMMONIA in the last 168 hours. Coagulation Profile: No results for input(s): INR, PROTIME in the last 168 hours.  Cardiac Enzymes: No results for input(s): CKTOTAL, CKMB, CKMBINDEX, TROPONINI in the last 168 hours. BNP (last 3 results) No results for input(s): PROBNP in the last 8760 hours. HbA1C: No results for input(s): HGBA1C in the last 72 hours. CBG: Recent Labs  Lab 02/11/21 1745  GLUCAP 193*   Lipid Profile: No results for input(s): CHOL, HDL, LDLCALC, TRIG, CHOLHDL, LDLDIRECT in the last 72 hours. Thyroid Function Tests: No results for input(s): TSH, T4TOTAL, FREET4, T3FREE, THYROIDAB in the last 72 hours. Anemia Panel: No results for input(s): VITAMINB12, FOLATE, FERRITIN, TIBC, IRON, RETICCTPCT in the last 72 hours. Urine analysis:    Component Value Date/Time   COLORURINE YELLOW 02/11/2021 [place] 02/11/2021 1755   LABSPEC 1.013 02/11/2021 1755   PHURINE 5.0 02/11/2021 1755   GLUCOSEU NEGATIVE 02/11/2021 1755   HGBUR SMALL (A) 02/11/2021 1755   BILIRUBINUR NEGATIVE 02/11/2021 1755   KETONESUR 5 (A) 02/11/2021 1755   PROTEINUR NEGATIVE 02/11/2021 1755   UROBILINOGEN 1.0 03/13/2008 1130   NITRITE NEGATIVE 02/11/2021 1755   LEUKOCYTESUR NEGATIVE 02/11/2021 1755    Radiological Exams on Admission: CT HEAD WO CONTRAST (5MM)  Result Date: 02/11/2021 CLINICAL DATA:  Weakness, fatigue EXAM: CT HEAD WITHOUT CONTRAST TECHNIQUE: Contiguous axial images were obtained from the base of the skull through the vertex without intravenous contrast. COMPARISON:  10/18/2018 FINDINGS: Brain: Chronic small vessel ischemic changes are again seen throughout the periventricular white matter, stable. No signs of acute infarct or hemorrhage. Lateral ventricles  and remaining midline structures are unremarkable. There are no acute extra-axial fluid collections. No mass effect. Vascular: No hyperdense vessel or unexpected calcification. Skull: Normal. Negative for fracture or focal lesion. Sinuses/Orbits: Mild mucosal thickening within the ethmoid air cells. Remaining paranasal sinuses are clear. Other: None. IMPRESSION: 1. Stable chronic ischemic changes as above. No acute intracranial process. Electronically Signed   By: Randa Ngo M.D.   On: 02/11/2021 20:44   CT ABDOMEN PELVIS W CONTRAST  Result Date: 02/11/2021 CLINICAL DATA:  Nausea, vomiting, anorexia, oliguria. Abdominal pain and back pain. EXAM: CT ABDOMEN AND PELVIS WITH CONTRAST TECHNIQUE: Multidetector CT imaging of the abdomen and pelvis was performed using the standard protocol following bolus administration of intravenous contrast. CONTRAST:  45mL OMNIPAQUE IOHEXOL 350 MG/ML SOLN COMPARISON:  07/12/2020 FINDINGS: Lower chest: Patchy infiltrate is seen within the a posterior basal right lower lobe, possibly infectious or inflammatory in nature. Mild left basilar atelectasis. No pleural effusion. Cardiac size within normal limits. No pericardial effusion. Hepatobiliary: No focal liver abnormality is seen. Status post cholecystectomy. No biliary dilatation. Pancreas: Unremarkable Spleen: Unremarkable Adrenals/Urinary Tract: The adrenal glands are unremarkable. The kidneys are normal in size and position. Stable 7 mm nonobstructing calculus within the lower pole the left kidney. No ureteral calculi. No hydronephrosis. Multiple simple bilateral cortical cysts are again identified. There is a stable 20 mm partially exophytic low-attenuation lesion arising from the upper pole of the right kidney which may represent a hyperdense renal cyst or cystic renal mass, but is not well characterized on this single-phase examination. This appears stable, however, since remote prior examination of 01/22/2015 and its  stability over time favors a benign lesion. The bladder is unremarkable. Stomach/Bowel: Stomach is within normal limits. Appendix appears normal. No evidence of bowel wall thickening, distention, or inflammatory changes. Vascular/Lymphatic: Aortic atherosclerosis. No enlarged abdominal or pelvic lymph nodes. Reproductive: Prostatectomy and pelvic lymph node dissection has been performed. No recurrent mass lesion. Other: No abdominal wall hernia.  Rectum unremarkable.  Musculoskeletal: Bilateral hip ORIF has been performed. Healed fractures the right superior and inferior pubic rami noted. Degenerative changes are seen within the lumbar spine. No acute bone abnormality. No lytic or blastic bone lesion. IMPRESSION: No acute intra-abdominal pathology identified. No definite radiographic explanation for the patient's reported symptoms. Patchy infiltrate within the visualized right lung base, possibly infectious or inflammatory. Stable mild left nonobstructing nephrolithiasis. Indeterminate low-attenuation exophytic lesion arising from the upper pole of the right kidney, stable since prior examination of 01/22/2015. Its stability over time, however, favors a hyperdense renal cyst. Aortic Atherosclerosis (ICD10-I70.0). Electronically Signed   By: Fidela Salisbury M.D.   On: 02/11/2021 20:55   DG Chest Portable 1 View  Result Date: 02/11/2021 CLINICAL DATA:  Weakness, chest pain EXAM: PORTABLE CHEST 1 VIEW COMPARISON:  Chest x-ray 10/14/2018 FINDINGS: The heart and mediastinal contours are within normal limits. Vague right perihilar airspace density may be due to rotation. No pulmonary edema. No pleural effusion. No pneumothorax. No acute osseous abnormality. IMPRESSION: Low lung volumes with vague right perihilar airspace density may be due to rotation. Otherwise no acute cardiopulmonary abnormality. Electronically Signed   By: Iven Finn M.D.   On: 02/11/2021 19:15     EKG: Independently reviewed, with result  as described above.    Assessment/Plan    Principal Problem:   Acute metabolic encephalopathy Active Problems:   Type 2 diabetes mellitus without complication, without long-term current use of insulin (HCC)   Hyperlipidemia   Essential hypertension   Anemia of chronic disease   CKD (chronic kidney disease) stage 3, GFR 30-59 ml/min (HCC)   Severe sepsis ([place])   Community acquired pneumonia   Generalized weakness   Elevated troponin     #) Acute metabolic encephalopathy: 2 days of progressive confusion relative to baseline mental status, which is associated with suspected mild underlying dementia. Patient's acute encephalopathy appears to be on the basis of physiologic stressors stemming from presenting severe sepsis d/t suspected right-sided pneumonia, as further detailed below.  No obvious additional contributory underlying infectious process at this time, including negative COVID-19/influenza PCR performed today and presenting UA that is not suggestive of UTI.  There is the possibility of secondary pharmacologic exacerbating factors from inclusion of multiple central acting medications on the patient's home medication list, including Zoloft, Robaxin, and Remeron.  Additionally, he is noted to be on Protonix in the setting of a history of GERD.  No overt acute focal neurologic deficits to suggest a contribution from an underlying acute CVA. Seizures are also felt to be less likely. Will check VBG to evaluate for any contribution from hypercapnic encephalopathy.  Additionally, in the setting of urinalysis demonstrating hemoglobin in the absence of red blood cells, will check CPK to evaluate for any contribution from rhabdomyolysis.   Plan: fall precautions. Repeat CMP/CBC in the AM. Check magnesium level. check VBG, TSH, MMA.  Delete that.  Check CPK, urinary drug screen.  Hold outpatient Robaxin, Remeron, Protonix, and Zoloft for now.  Further evaluation management presents for sepsis due  to right-sided pneumonia, as further detailed below.  Check INR to evaluate hepatic synthetic function.     #) Severe sepsis due to right-sided pneumonia: In the setting of 2 days of progressive confusion, patient is noted to have objective fever in the ED, with ensuing imaging revealing evidence of right perihilar airspace opacity concerning for infiltrate as well as right basilar patchy infiltrate concerning for infection.  Additional SIRS criteria include presenting leukocytosis, mild tachycardia, and mild  tachypnea.  No evidence of associated acute hypoxic respiratory distress/failure.  No evidence of additional underlying infectious process, including no evidence of UTI, while COVID-19/influenza PCR found to be negative in the ED today.  Of note, lactic acid found to be nonelevated, as quantified above.  However, patient sepsis meets criteria to be considered severe in nature on the basis of concomitant acute encephalopathy, as above.  No evidence of associated hypotension. In the absence of lactic acid level that is greater than or equal to 4.0, and in the absence of any associated hypotension, there are no indications for administration of a 30 mL/kg IVF bolus at this time.  Blood cultures x2 collected in the ED prior to initiation of IV antibiotics.  Specifically, in the setting of severe sepsis 87 year old male with age-related relative immunocompromisation, EDP initiated broad-spectrum IV antibiotics including IV vancomycin, cefepime, Flagyl.  Will refrain from additional dosing of IV Flagyl at this time, as there does not appear to be significant suspicion for contributory aspiration pneumonia. Will otherwise continue with his existing broad-spectrum IV antibiotic regimen. However, will check MRSA PCR, which, if negative, can like to discontinue IV vancomycin given the high negative predictive value for MRSA pneumonia associated with such as result.  Also consider de-escalation of cefepime to  Rocephin.    Plan: Monitor for results of blood cultures x2 collected in the ED today prior to initiation of antibiotics. Abx: Continue IV vancomycin and cefepime, as above.  Repeat CBC with differential in the morning.  Monitor continuous pulse oximetry.  Monitor on telemetry. Prn acetaminophen for fever.  Gentle continuous IV fluids overnight.  Check procalcitonin, MRSA PCR, strep pneumoniae urine antigen.      #) Generalized weakness: 2 to 3-day duration duration of generalized weakness, in the absence of any evidence of acute focal neurologic deficits, including no evidence of acute focal weakness. Consequently, acute ischemic CVA is felt to be less likely at this time, while noting that presenting CT head showed no evidence of acute intracranial process.  Suspect contribution from physiologic stress stemming from presenting severe sepsis due to right-sided pneumonia, as further detailed above.  No evidence of additional underlying infectious process.  We will also evaluate for rhabdomyolysis the checking of CPK, as further detailed above.    Plan: work-up and management of presenting severe sepsis due to right-sided anemia, as described above. Physical therapy consult has been placed. Fall precautions. Check TSH, MMA.  Check CPK.      #) Elevated troponin: Initial troponin in the ED noted to be 13, with repeat value trending up slightly to 19. No prior high sensitivity troponin I value available for point of comparison.  Suspect that this mildly elevated troponin is on the basis of supply demand mismatch in the setting of severe sepsis due to right-sided ammonia as opposed to representing a type I process due to acute plaque rupture.  There may also be a degree of elevation from diminished renal clearance in the setting of stage IIIa chronic kidney disease.  EKG shows no evidence of acute ischemic changes, including no evidence of STEMI, and CXR right perihilar infiltrate, but otherwise no  evidence of acute cardiopulmonary process, while in the setting CT abdomen/pelvis showed no evidence of right basilar patchy infiltrate.  No evidence of pneumothorax on imaging.  No reported associated chest pain.  Overall, ACS is felt to be less likely relative to type 2 supply demand mismatch, as above, but will closely monitor on telemetry overnight while treating suspected  underlying severe sepsis due to right-sided pneumonia, as further described above.  Presentation is also less clinically suggestive of acute pulmonary embolism.   Plan: repeat troponin in the AM. Monitor on telemetry. Check serum Mg level and repeat BMP in the morning.  Repeat CBC in the AM. Additional evaluation and management of presenting severe sepsis due to right sided pneumonia as suspected driving force behind mildly elevated troponin, as above.       #) Type 2 Diabetes Mellitus: documented history of such. Home insulin regimen: None. Home oral hypoglycemic agents: Glimepiride. presenting blood sugar 200.   Plan: accuchecks QAC and HS with low-dose sliding scale insulin.  Hold home glimepiride during this hospitalization.      #) Essential Hypertension: documented history of such, with outpatient antihypertensive regimen including Norvasc, losartan.  Systolic blood pressure in the ED today noted to be in the range of 120s to 160s, without associated evidence of hypotension.  In the setting of presenting severe sepsis due to right-sided ammonia, will hold home antihypertensive medications for now.    Plan: Close monitoring of subsequent blood pressure via routine vital signs.  Hold home and hypertensive medications for now, as above.      #) Hyperlipidemia: Documented history of such, on atorvastatin as an outpatient.  Plan: Continue home statin.      #) Stage IIIa chronic kidney disease: Associated baseline creatinine range of 1.4-1.7, with presenting labs reflecting serum creatinine within this  range.  Plan: Monitor strict I's and O's and daily weights.  Tempt avoid nephrotoxic agents.  Repeat BMP in the morning.       #) GERD: On Protonix as an outpatient.  In the setting of presenting acute encephalopathy, will hold home PPI for now eliminate any confounding contribution to presenting altered mental status as a result of this class of medication.  Plan: Hold home PPI for now, as above.      #) Anemia of chronic disease: Documented history of such, with likely contribution from chronic kidney disease, with baseline hemoglobin range noted to be 9-12, and presenting labs reflecting hemoglobin consistent with this range, and unchanged quantitatively from most recent prior value.  No evidence of active bleed at this time.  Plan: Repeat CBC in the morning.  Check INR.     #) History of ischemic CVAs: Continues to take Plavix and is also on atorvastatin, as further detailed above.  No evidence of acute focal neurologic deficits to suggest presenting acute stroke, while noncontrast CT of the head performed in the ED today showed no evidence of acute intracranial process, including hypertensive intracranial hemorrhage.  Plan: Continue home Plavix and atorvastatin.    DVT prophylaxis: SCD's   Code Status: Full code Disposition Plan: Per Rounding Team Consults called: none;  Admission status: Inpatient; med telemetry   Of note, this patient was added by me to the following Admit List/Treatment Team:  mcadmits.   Of note, the Adult Admission Order Set (Multimorbid order set) was used by me in the admission process for this patient.  PLEASE NOTE THAT DRAGON DICTATION SOFTWARE WAS USED IN THE CONSTRUCTION OF THIS NOTE.   Rhetta Mura DO [facility] Pager [phone] From [place]   02/11/2021, 9:45 PM

## 2021-02-11 NOTE — Progress Notes (Addendum)
Pharmacy Antibiotic Note  Tyler [name] is a 87 y.o. male admitted on 02/11/2021 with sepsis.  Pharmacy has been consulted for vancomycin and cefepime dosing.  Patient presented with Tmax 100.5. WBC 15.5, lactate 1.8, and SCr 1.58 (BL ~1.4). No weight this admission, historical weight ~70 kg.  Flagyl per MD  Plan: Cefepime 2g IV q12 hours Vancomycin 1500mg  IV once loading dose Vancomycin 750mg  IV q24h maintenance       eAUC 479.6 using SCr 1.58  Monitor renal function, vancomycin levels, and clinical improvement    Temp (24hrs), Avg:99.1 F (37.3 C), Min:97.7 F (36.5 C), Max:100.5 F (38.1 C)  Recent Labs  Lab 02/11/21 1830  WBC 15.5*  CREATININE 1.58*  LATICACIDVEN 1.8    CrCl cannot be calculated (Unknown ideal weight.).    Allergies  Allergen Reactions   Sulfa Drugs Cross Reactors Anaphylaxis and Rash    Lungs fill with fluid    Antimicrobials this admission: Vancomycin 10/11>> Cefepime 10/11>> Flagyl 10/11>>  Microbiology results: 10/11 BCx: in process 10/11 UCx: in process   Thank you for allowing pharmacy to be a part of this patient's care.  Donald Pore 02/11/2021 8:16 PM

## 2021-02-11 NOTE — ED Triage Notes (Signed)
Pt bib ems from home with reports of weakness, fatigue, decreases PO intake, increased urination. Pt with generalized pain with worsening in his back. Symptom onset today. Pt also shob. EMS placed on 2L [place]. Given 500cc NS en route.  178/90 HR 90 92% RA CBG 223

## 2021-02-11 NOTE — ED Notes (Signed)
Patient transported to CT 

## 2021-02-11 NOTE — ED Notes (Signed)
Pt also endorses a sense of impending doom.

## 2021-02-11 NOTE — Sepsis Progress Note (Signed)
Following per sepsis protocol   

## 2021-02-12 ENCOUNTER — Inpatient Hospital Stay (HOSPITAL_COMMUNITY): Payer: PPO

## 2021-02-12 DIAGNOSIS — R7989 Other specified abnormal findings of blood chemistry: Secondary | ICD-10-CM | POA: Diagnosis present

## 2021-02-12 DIAGNOSIS — R778 Other specified abnormalities of plasma proteins: Secondary | ICD-10-CM | POA: Diagnosis present

## 2021-02-12 DIAGNOSIS — R6520 Severe sepsis without septic shock: Secondary | ICD-10-CM | POA: Diagnosis present

## 2021-02-12 DIAGNOSIS — G9341 Metabolic encephalopathy: Secondary | ICD-10-CM

## 2021-02-12 DIAGNOSIS — J189 Pneumonia, unspecified organism: Secondary | ICD-10-CM | POA: Diagnosis present

## 2021-02-12 DIAGNOSIS — A419 Sepsis, unspecified organism: Secondary | ICD-10-CM | POA: Diagnosis present

## 2021-02-12 DIAGNOSIS — R531 Weakness: Secondary | ICD-10-CM

## 2021-02-12 LAB — CBC WITH DIFFERENTIAL/PLATELET
Abs Immature Granulocytes: 0.1 10*3/uL — ABNORMAL HIGH (ref 0.00–0.07)
Basophils Absolute: 0 10*3/uL (ref 0.0–0.1)
Basophils Relative: 0 %
Eosinophils Absolute: 0 10*3/uL (ref 0.0–0.5)
Eosinophils Relative: 0 %
HCT: 27.3 % — ABNORMAL LOW (ref 39.0–52.0)
Hemoglobin: 9.1 g/dL — ABNORMAL LOW (ref 13.0–17.0)
Immature Granulocytes: 1 %
Lymphocytes Relative: 9 %
Lymphs Abs: 1.4 10*3/uL (ref 0.7–4.0)
MCH: 33 pg (ref 26.0–34.0)
MCHC: 33.3 g/dL (ref 30.0–36.0)
MCV: 98.9 fL (ref 80.0–100.0)
Monocytes Absolute: 0.9 10*3/uL (ref 0.1–1.0)
Monocytes Relative: 6 %
Neutro Abs: 13.1 10*3/uL — ABNORMAL HIGH (ref 1.7–7.7)
Neutrophils Relative %: 84 %
Platelets: 139 10*3/uL — ABNORMAL LOW (ref 150–400)
RBC: 2.76 MIL/uL — ABNORMAL LOW (ref 4.22–5.81)
RDW: 13.8 % (ref 11.5–15.5)
WBC: 15.5 10*3/uL — ABNORMAL HIGH (ref 4.0–10.5)
nRBC: 0 % (ref 0.0–0.2)

## 2021-02-12 LAB — I-STAT VENOUS BLOOD GAS, ED
Acid-base deficit: 2 mmol/L (ref 0.0–2.0)
Bicarbonate: 22.9 mmol/L (ref 20.0–28.0)
Calcium, Ion: 1.18 mmol/L (ref 1.15–1.40)
HCT: 27 % — ABNORMAL LOW (ref 39.0–52.0)
Hemoglobin: 9.2 g/dL — ABNORMAL LOW (ref 13.0–17.0)
O2 Saturation: 99 %
Potassium: 4.1 mmol/L (ref 3.5–5.1)
Sodium: 138 mmol/L (ref 135–145)
TCO2: 24 mmol/L (ref 22–32)
pCO2, Ven: 37.4 mmHg — ABNORMAL LOW (ref 44.0–60.0)
pH, Ven: 7.394 (ref 7.250–7.430)
pO2, Ven: 123 mmHg — ABNORMAL HIGH (ref 32.0–45.0)

## 2021-02-12 LAB — HEMOGLOBIN A1C
Hgb A1c MFr Bld: 6.9 % — ABNORMAL HIGH (ref 4.8–5.6)
Mean Plasma Glucose: 151.33 mg/dL

## 2021-02-12 LAB — COMPREHENSIVE METABOLIC PANEL
ALT: 23 U/L (ref 0–44)
AST: 20 U/L (ref 15–41)
Albumin: 3.2 g/dL — ABNORMAL LOW (ref 3.5–5.0)
Alkaline Phosphatase: 58 U/L (ref 38–126)
Anion gap: 10 (ref 5–15)
BUN: 25 mg/dL — ABNORMAL HIGH (ref 8–23)
CO2: 21 mmol/L — ABNORMAL LOW (ref 22–32)
Calcium: 9.1 mg/dL (ref 8.9–10.3)
Chloride: 106 mmol/L (ref 98–111)
Creatinine, Ser: 1.51 mg/dL — ABNORMAL HIGH (ref 0.61–1.24)
GFR, Estimated: 44 mL/min — ABNORMAL LOW (ref >60)
Glucose, Bld: 127 mg/dL — ABNORMAL HIGH (ref 70–99)
Potassium: 4.1 mmol/L (ref 3.5–5.1)
Sodium: 137 mmol/L (ref 135–145)
Total Bilirubin: 1.1 mg/dL (ref 0.3–1.2)
Total Protein: 5.5 g/dL — ABNORMAL LOW (ref 6.5–8.1)

## 2021-02-12 LAB — CBG MONITORING, ED
Glucose-Capillary: 90 mg/dL (ref 70–99)
Glucose-Capillary: 91 mg/dL (ref 70–99)

## 2021-02-12 LAB — PROCALCITONIN: Procalcitonin: 7.52 ng/mL

## 2021-02-12 LAB — TROPONIN I (HIGH SENSITIVITY): Troponin I (High Sensitivity): 47 ng/L — ABNORMAL HIGH (ref <18)

## 2021-02-12 LAB — PROTIME-INR
INR: 1.2 (ref 0.8–1.2)
Prothrombin Time: 14.7 seconds (ref 11.4–15.2)

## 2021-02-12 LAB — VITAMIN B12: Vitamin B-12: 3370 pg/mL — ABNORMAL HIGH (ref 180–914)

## 2021-02-12 LAB — CK: Total CK: 90 U/L (ref 49–397)

## 2021-02-12 LAB — TSH: TSH: 2.05 u[IU]/mL (ref 0.350–4.500)

## 2021-02-12 LAB — MRSA NEXT GEN BY PCR, NASAL: MRSA by PCR Next Gen: NOT DETECTED

## 2021-02-12 LAB — STREP PNEUMONIAE URINARY ANTIGEN: Strep Pneumo Urinary Antigen: NEGATIVE

## 2021-02-12 LAB — MAGNESIUM: Magnesium: 1.4 mg/dL — ABNORMAL LOW (ref 1.7–2.4)

## 2021-02-12 LAB — GLUCOSE, CAPILLARY: Glucose-Capillary: 184 mg/dL — ABNORMAL HIGH (ref 70–99)

## 2021-02-12 MED ORDER — FAMOTIDINE 20 MG PO TABS
40.0000 mg | ORAL_TABLET | Freq: Every day | ORAL | Status: DC
  Administered 2021-02-12 – 2021-02-15 (×4): 40 mg via ORAL
  Filled 2021-02-12 (×4): qty 2

## 2021-02-12 MED ORDER — SODIUM CHLORIDE 0.9 % IV SOLN
1.5000 g | Freq: Two times a day (BID) | INTRAVENOUS | Status: DC
  Administered 2021-02-12 – 2021-02-13 (×3): 1.5 g via INTRAVENOUS
  Filled 2021-02-12 (×5): qty 4

## 2021-02-12 MED ORDER — KCL-LACTATED RINGERS-D5W 20 MEQ/L IV SOLN
INTRAVENOUS | Status: AC
  Filled 2021-02-12 (×2): qty 1000

## 2021-02-12 MED ORDER — ENOXAPARIN SODIUM 30 MG/0.3ML IJ SOSY
30.0000 mg | PREFILLED_SYRINGE | INTRAMUSCULAR | Status: DC
  Administered 2021-02-12 – 2021-02-17 (×6): 30 mg via SUBCUTANEOUS
  Filled 2021-02-12 (×6): qty 0.3

## 2021-02-12 MED ORDER — IOHEXOL 350 MG/ML SOLN
60.0000 mL | Freq: Once | INTRAVENOUS | Status: AC | PRN
  Administered 2021-02-12: 60 mL via INTRAVENOUS

## 2021-02-12 MED ORDER — CLOPIDOGREL BISULFATE 75 MG PO TABS
75.0000 mg | ORAL_TABLET | Freq: Every day | ORAL | Status: DC
  Administered 2021-02-12 – 2021-02-24 (×13): 75 mg via ORAL
  Filled 2021-02-12 (×13): qty 1

## 2021-02-12 MED ORDER — MAGNESIUM SULFATE 50 % IJ SOLN: 3.0000 g | Freq: Once | INTRAVENOUS | Status: DC

## 2021-02-12 MED ORDER — VITAMIN B-12 1000 MCG PO TABS
1000.0000 ug | ORAL_TABLET | Freq: Every day | ORAL | Status: DC
  Administered 2021-02-12 – 2021-02-24 (×13): 1000 ug via ORAL
  Filled 2021-02-12 (×13): qty 1

## 2021-02-12 MED ORDER — MAGNESIUM SULFATE 2 GM/50ML IV SOLN
2.0000 g | Freq: Once | INTRAVENOUS | Status: AC
  Administered 2021-02-12: 2 g via INTRAVENOUS
  Filled 2021-02-12: qty 50

## 2021-02-12 MED ORDER — DOCUSATE SODIUM 100 MG PO CAPS
100.0000 mg | ORAL_CAPSULE | Freq: Every day | ORAL | Status: DC
  Administered 2021-02-12 – 2021-02-24 (×12): 100 mg via ORAL
  Filled 2021-02-12 (×12): qty 1

## 2021-02-12 MED ORDER — HYDRALAZINE HCL 20 MG/ML IJ SOLN
10.0000 mg | Freq: Four times a day (QID) | INTRAMUSCULAR | Status: DC | PRN
  Administered 2021-02-13 – 2021-02-15 (×4): 10 mg via INTRAVENOUS
  Filled 2021-02-12 (×4): qty 1

## 2021-02-12 NOTE — ED Notes (Signed)
Tyler [name]. (Son) called and ask for an update when the test results come back. [phone]

## 2021-02-12 NOTE — Progress Notes (Signed)
Pharmacy Antibiotic Note  Tyler [name] is a 87 y.o. male admitted on 02/11/2021 with sepsis.  Pharmacy has been consulted for vancomycin and cefepime dosing > de-escalate to Unasyn  Plan: Cefepime 2g IV q12 hours Vancomycin 1500mg  IV once loading dose Vancomycin 750mg  IV q24h maintenance       eAUC 479.6 using SCr 1.58  Monitor renal function, vancomycin levels, and clinical improvement >> start Unasyn 1.5g IV q12h based on CrCl  Height: 5\' 10"  (177.8 cm) Weight: 70.3 kg (154 lb 15.7 oz) IBW/kg (Calculated) : 73  Temp (24hrs), Avg:99 F (37.2 C), Min:97.7 F (36.5 C), Max:100.5 F (38.1 C)  Recent Labs  Lab 02/11/21 1830 02/11/21 2019 02/12/21 0500  WBC 15.5*  --  15.5*  CREATININE 1.58*  --  1.51*  LATICACIDVEN 1.8 1.3  --      Estimated Creatinine Clearance: 32.3 mL/min (A) (by C-G formula based on SCr of 1.51 mg/dL (H)).    Allergies  Allergen Reactions   Sulfa Drugs Cross Reactors Anaphylaxis and Rash    Lungs fill with fluid   Metformin Hcl Nausea Only    Antimicrobials this admission: Vancomycin 10/11>>12 Cefepime 10/11>>12 Flagyl 10/11>>12 Unasyn 10/12>>  Microbiology results: 10/11 BCx: in process 10/11 UCx: in process   Thank you for allowing pharmacy to be a part of this patient's care.  Joetta Manners, PharmD, [facility] Emergency Medicine Clinical Pharmacist ED RPh Phone: [place]: [phone]

## 2021-02-12 NOTE — Progress Notes (Signed)
PROGRESS NOTE                                                                                                                                                                                                             Patient Demographics:    Tyler [name], is a 87 y.o. male, DOB - 25-Aug-1928, GXQ:[id number]  Outpatient Primary MD for the patient is [name], MD    LOS - 1  Admit date - 02/11/2021    Chief Complaint  Patient presents with   Weakness       Brief Narrative (HPI from H&P)   Tyler [name] is a 87 y.o. male with medical history significant for type 2 diabetes mellitus, essential hypertension, hyperlipidemia, stage IIIa chronic kidney disease with baseline creatinine reported 1.7, anemia of chronic disease with baseline hemoglobin 9-12, who presented from home to [facility] ED for evaluation of altered mental status, had Asp. PNA.   Subjective:    Tyler [name] today in bed quite confused and unable to answer questions reliably however denies any headache or chest pain, unreliable historian.   Assessment  & Plan :      Acute Toxic encephalopathy due to aspiration pneumonia - had detailed discussion with patient's son and life, currently has been having problems swallowing food or liquids for several weeks, at this time will taper his medications down to IV Unasyn, gentle IV fluid, speech eval and treat and NPO.  Head CT is unremarkable and he has no focal neurological deficits.  He does not have sepsis.  Will monitor closely.  Encouraged the patient to sit up in chair in the daytime use I-S and flutter valve for pulmonary toiletry.  Will advance activity and titrate down oxygen as possible.   2.  Weakness and deconditioning due to #1 above.  PT OT and monitor.  3.  Insignificant mild elevation in troponin patient ACS pattern.  Chest pain-free.  EKG is nonacute, continue statin and Plavix for secondary prevention.   No further work-up.  Mild troponin rise likely underlying infection.  4.  Essential hypertension.  Blood pressure is stable off medications for now only with as needed hydralazine.  5.  Dyslipidemia.  Statin.  6.  CKD 3B.  Baseline creatinine around 1.5.  Stable.  Monitor with gentle hydration.  7.  GERD.  Placed on Pepcid.  8.  AOCD.  Stable no acute issue.  9.  History of CVA in the past.  No new focal deficits, head CT stable, no headache, continue Plavix and statin for secondary prevention.  10.  Hypomagnesemia.  Replace.    11. DM type II.  Sliding scale.  This SmartLink has not been configured with any valid records.   CBG (last 3)  Recent Labs    02/11/21 1745 02/12/21 0802  GLUCAP 193* 90         Condition -  Guarded  Family Communication  :    [name] [phone] 02/12/21 at 8.54 am message left  Called son Tyler [name] [phone] + Wife 02/12/21 updated   Code Status :  DNR  Consults  :  None  PUD Prophylaxis :    Procedures  :     CT Head - 1. Stable chronic ischemic changes as above. No acute intracranial process  CT ABD  - Pelvis - No acute intra-abdominal pathology identified. No definite radiographic explanation for the patient's reported symptoms. Patchy infiltrate within the visualized right lung base, possibly infectious or inflammatory. Stable mild left nonobstructing nephrolithiasis. Indeterminate low-attenuation exophytic lesion arising from the upper pole of the right kidney, stable since prior examination of 01/22/2015. Its stability over time, however, favors a hyperdense renal cyst. Aortic Atherosclerosis (ICD10-I70.0).      Disposition Plan  :    Status is: Inpatient  Remains inpatient appropriate because:IV treatments appropriate due to intensity of illness or inability to take PO  Dispo: The patient is from: Home              Anticipated d/c is to: SNF              Patient currently is not medically stable to d/c.    Difficult to place patient No  DVT Prophylaxis  :    SCDs Start: 02/11/21 2144  Lab Results  Component Value Date   PLT 139 (L) 02/12/2021    Diet :  Diet Order             Diet NPO time specified Except for: Sips with Meds  Diet effective now                    Inpatient Medications  Scheduled Meds:  atorvastatin  40 mg Oral Daily   clopidogrel  75 mg Oral Q breakfast   insulin aspart  0-6 Units Subcutaneous TID WC   Continuous Infusions:  ceFEPime (MAXIPIME) IV 2 g (02/12/21 0814)   dextrose 5% lactated ringers with KCl 20 mEq/L     magnesium sulfate bolus IVPB 2 g (02/12/21 0845)   vancomycin     PRN Meds:.acetaminophen **OR** acetaminophen  Antibiotics  :    Anti-infectives (From admission, onward)    Start     Dose/Rate Route Frequency Ordered Stop   02/12/21 2100  vancomycin (VANCOREADY) IVPB 750 mg/150 mL        750 mg 150 mL/hr over 60 Minutes Intravenous Every 24 hours 02/11/21 2037     02/12/21 0700  ceFEPIme (MAXIPIME) 2 g in sodium chloride 0.9 % 100 mL IVPB        2 g 200 mL/hr over 30 Minutes Intravenous Every 12 hours 02/11/21 2042     02/12/21 0400  ceFEPIme (MAXIPIME) 2 g in sodium chloride 0.9 % 100 mL IVPB  Status:  Discontinued        2 g 200 mL/hr over 30 Minutes  Intravenous Every 8 hours 02/11/21 2037 02/11/21 2042   02/11/21 1845  ceFEPIme (MAXIPIME) 2 g in sodium chloride 0.9 % 100 mL IVPB        2 g 200 mL/hr over 30 Minutes Intravenous  Once 02/11/21 1833 02/11/21 1934   02/11/21 1845  metroNIDAZOLE (FLAGYL) IVPB 500 mg        500 mg 100 mL/hr over 60 Minutes Intravenous  Once 02/11/21 1833 02/11/21 1950   02/11/21 1845  vancomycin (VANCOREADY) IVPB 1500 mg/300 mL        1,500 mg 150 mL/hr over 120 Minutes Intravenous  Once 02/11/21 1833 02/11/21 2320        Time Spent in minutes  30   [name] M.D on 02/12/2021 at 9:00 AM  To page go to [email or web address]   [facility] -  Office  [phone]  See all Orders  from today for further details    Objective:   Vitals:   02/12/21 0615 02/12/21 0630 02/12/21 0730 02/12/21 0750  BP: (!) 123/47 (!) 126/52 (!) 125/53 (!) 124/52  Pulse: 67 67 68 69  Resp: (!) 22 (!) 21 18 19   Temp:    98.7 F (37.1 C)  TempSrc:    Oral  SpO2: 92% 93% 97% 99%    Wt Readings from Last 3 Encounters:  10/12/18 70.3 kg  07/11/18 74.4 kg  06/24/18 73 kg    No intake or output data in the 24 hours ending 02/12/21 0900   Physical Exam  Awake - confused, No new F.N deficits,   Sum[place].AT,[name], [name],   Symmetrical Chest wall movement, Good air movement bilaterally, CTAB RRR,No Gallops,Rubs or new Murmurs, No Parasternal Heave +ve B.Sounds, Abd Soft, No tenderness,   No Cyanosis, Clubbing or edema,        Data Review:    CBC Recent Labs  Lab 02/11/21 1830 02/12/21 0500 02/12/21 0508  WBC 15.5* 15.5*  --   HGB 10.3* 9.1* 9.2*  HCT 31.3* 27.3* 27.0*  PLT 152 139*  --   MCV 99.7 98.9  --   MCH 32.8 33.0  --   MCHC 32.9 33.3  --   RDW 14.0 13.8  --   LYMPHSABS 0.5* 1.4  --   MONOABS 0.8 0.9  --   EOSABS 0.0 0.0  --   BASOSABS 0.0 0.0  --     Recent Labs  Lab 02/11/21 1830 02/11/21 2019 02/12/21 0500 02/12/21 0508  NA 137  --  137 138  K 4.2  --  4.1 4.1  CL 104  --  106  --   CO2 21*  --  21*  --   GLUCOSE 200*  --  127*  --   BUN 24*  --  25*  --   CREATININE 1.58*  --  1.51*  --   CALCIUM 9.7  --  9.1  --   AST 36  --  20  --   ALT 27  --  23  --   ALKPHOS 79  --  58  --   BILITOT 1.6*  --  1.1  --   ALBUMIN 4.0  --  3.2*  --   MG  --   --  1.4*  --   LATICACIDVEN 1.8 1.3  --   --   INR  --   --  1.2  --     ------------------------------------------------------------------------------------------------------------------ No results for input(s): CHOL, HDL, LDLCALC, TRIG, CHOLHDL, LDLDIRECT in the  last 72 hours.  Lab Results  Component Value Date   HGBA1C 6.5 (H) 10/12/2018    ------------------------------------------------------------------------------------------------------------------ No results for input(s): TSH, T4TOTAL, T3FREE, THYROIDAB in the last 72 hours.  Invalid input(s): FREET3  Cardiac Enzymes No results for input(s): CKMB, TROPONINI, MYOGLOBIN in the last 168 hours.  Invalid input(s): CK ------------------------------------------------------------------------------------------------------------------ No results found for: BNP   Radiology Reports CT HEAD WO CONTRAST (5MM)  Result Date: 02/11/2021 CLINICAL DATA:  Weakness, fatigue EXAM: CT HEAD WITHOUT CONTRAST TECHNIQUE: Contiguous axial images were obtained from the base of the skull through the vertex without intravenous contrast. COMPARISON:  10/18/2018 FINDINGS: Brain: Chronic small vessel ischemic changes are again seen throughout the periventricular white matter, stable. No signs of acute infarct or hemorrhage. Lateral ventricles and remaining midline structures are unremarkable. There are no acute extra-axial fluid collections. No mass effect. Vascular: No hyperdense vessel or unexpected calcification. Skull: Normal. Negative for fracture or focal lesion. Sinuses/Orbits: Mild mucosal thickening within the ethmoid air cells. Remaining paranasal sinuses are clear. Other: None. IMPRESSION: 1. Stable chronic ischemic changes as above. No acute intracranial process. Electronically Signed   By: Randa Ngo M.D.   On: 02/11/2021 20:44   CT ABDOMEN PELVIS W CONTRAST  Result Date: 02/11/2021 CLINICAL DATA:  Nausea, vomiting, anorexia, oliguria. Abdominal pain and back pain. EXAM: CT ABDOMEN AND PELVIS WITH CONTRAST TECHNIQUE: Multidetector CT imaging of the abdomen and pelvis was performed using the standard protocol following bolus administration of intravenous contrast. CONTRAST:  7mL OMNIPAQUE IOHEXOL 350 MG/ML SOLN COMPARISON:  07/12/2020 FINDINGS: Lower chest: Patchy infiltrate is seen  within the a posterior basal right lower lobe, possibly infectious or inflammatory in nature. Mild left basilar atelectasis. No pleural effusion. Cardiac size within normal limits. No pericardial effusion. Hepatobiliary: No focal liver abnormality is seen. Status post cholecystectomy. No biliary dilatation. Pancreas: Unremarkable Spleen: Unremarkable Adrenals/Urinary Tract: The adrenal glands are unremarkable. The kidneys are normal in size and position. Stable 7 mm nonobstructing calculus within the lower pole the left kidney. No ureteral calculi. No hydronephrosis. Multiple simple bilateral cortical cysts are again identified. There is a stable 20 mm partially exophytic low-attenuation lesion arising from the upper pole of the right kidney which may represent a hyperdense renal cyst or cystic renal mass, but is not well characterized on this single-phase examination. This appears stable, however, since remote prior examination of 01/22/2015 and its stability over time favors a benign lesion. The bladder is unremarkable. Stomach/Bowel: Stomach is within normal limits. Appendix appears normal. No evidence of bowel wall thickening, distention, or inflammatory changes. Vascular/Lymphatic: Aortic atherosclerosis. No enlarged abdominal or pelvic lymph nodes. Reproductive: Prostatectomy and pelvic lymph node dissection has been performed. No recurrent mass lesion. Other: No abdominal wall hernia.  Rectum unremarkable. Musculoskeletal: Bilateral hip ORIF has been performed. Healed fractures the right superior and inferior pubic rami noted. Degenerative changes are seen within the lumbar spine. No acute bone abnormality. No lytic or blastic bone lesion. IMPRESSION: No acute intra-abdominal pathology identified. No definite radiographic explanation for the patient's reported symptoms. Patchy infiltrate within the visualized right lung base, possibly infectious or inflammatory. Stable mild left nonobstructing  nephrolithiasis. Indeterminate low-attenuation exophytic lesion arising from the upper pole of the right kidney, stable since prior examination of 01/22/2015. Its stability over time, however, favors a hyperdense renal cyst. Aortic Atherosclerosis (ICD10-I70.0). Electronically Signed   By: Fidela Salisbury M.D.   On: 02/11/2021 20:55   DG Chest Portable 1 View  Result Date: 02/11/2021 CLINICAL DATA:  Weakness, chest pain EXAM: PORTABLE CHEST 1 VIEW COMPARISON:  Chest x-ray 10/14/2018 FINDINGS: The heart and mediastinal contours are within normal limits. Vague right perihilar airspace density may be due to rotation. No pulmonary edema. No pleural effusion. No pneumothorax. No acute osseous abnormality. IMPRESSION: Low lung volumes with vague right perihilar airspace density may be due to rotation. Otherwise no acute cardiopulmonary abnormality. Electronically Signed   By: Iven Finn M.D.   On: 02/11/2021 19:15

## 2021-02-12 NOTE — ED Notes (Signed)
Provider at bedside

## 2021-02-12 NOTE — ED Notes (Signed)
Speech therapist at bedside.

## 2021-02-12 NOTE — ED Notes (Signed)
Breakfast order placed ?

## 2021-02-12 NOTE — Progress Notes (Signed)
Paged MD because day nurse said patient had been normal and eating and talking just a short time prior to bedside report but during report, noted a pronounced facial droop and moderate to severe slurred speech.  RR was called and a Code stroke was initiated.  MD and RR nurse at bedside and upon assessment, MD did note the droop as well.  Was taken down to CT for a scan.  Son, Tyler [name], was notified and he said that his dad was normal when he was with him in the room talking with him this evening and that he did not notice any slurred speech and did not see a droop.  He is strong and able to move all extremities but was constantly attempting to climb out of bed  the entire time we were performing bedside report.  No resp distress and he was talking and following simple commands as well.

## 2021-02-12 NOTE — Evaluation (Signed)
Occupational Therapy Evaluation Patient Details Name: Tyler [name] MRN: [id number] DOB: 1928-05-17 Today's Date: 02/12/2021   History of Present Illness 87 y.o. male with medical history significant for type 2 diabetes mellitus, essential hypertension, hyperlipidemia, stage IIIa chronic kidney disease with baseline creatinine reported 1.7, anemia, who was admitted with acute metabolic encephalopathy.   Clinical Impression   Patient admitted for the diagnosis above.  Prior level of function was gathered from prior admission, some self reported by patient, and current H&P.  Initially he was very pleasant and willing to get up and move, but became distracted and preoccupied by the male Shepherd.  Patient did not believe he could move with the condom attached, and started becoming upset, so OT deferred OOB and mobility.  OT will continue efforts to fully establish a current functional status, but the patient, at first glance, looks as if he should mobilize well with supervision/Min guard due to cognition.  Recommend HH OT at this point, and discharge disposition recommendations will be adjusted as needed.  PT eval is pending.        Recommendations for follow up therapy are one component of a multi-disciplinary discharge planning process, led by the attending physician.  Recommendations may be updated based on patient status, additional functional criteria and insurance authorization.   Follow Up Recommendations  Home health OT    Equipment Recommendations  None recommended by OT    Recommendations for Other Services       Precautions / Restrictions Precautions Precautions: Fall Restrictions Weight Bearing Restrictions: No      Mobility Bed Mobility Overal bed mobility: Needs Assistance Bed Mobility: Supine to Sit;Sit to Supine     Supine to sit: Min guard Sit to supine: Min guard   General bed mobility comments: able to pull himsoef to long sitting in bed, no assist to initially  bring legs off the bed, and bring back onto the stretcher. Patient Response: Restless  Transfers                 General transfer comment: unable - patient was too focused on male purewick.    Balance Overall balance assessment: Needs assistance Sitting-balance support: Feet supported;Bilateral upper extremity supported Sitting balance-Leahy Scale: Fair                                     ADL either performed or assessed with clinical judgement   ADL Overall ADL's : Needs assistance/impaired Eating/Feeding: Set up;Bed level   Grooming: Wash/dry hands;Wash/dry face;Set up;Bed level   Upper Body Bathing: Minimal assistance;Bed level   Lower Body Bathing: Moderate assistance;Bed level   Upper Body Dressing : Minimal assistance;Bed level   Lower Body Dressing: Moderate assistance;Bed level Lower Body Dressing Details (indicate cue type and reason): moving UE and LE well, good ROM and strength.   Toilet Transfer Details (indicate cue type and reason): NT   Toileting - Clothing Manipulation Details (indicate cue type and reason): NT       General ADL Comments: NT - patient was very preoccupied with male Benedict.  Limited his willingness to get OOB, sit edge of bed, or trial walking at RW level.     Vision Baseline Vision/History: 1 Wears glasses Patient Visual Report: No change from baseline       Perception     Praxis      Pertinent Vitals/Pain Pain Assessment: No/denies pain  Hand Dominance Right   Extremity/Trunk Assessment Upper Extremity Assessment Upper Extremity Assessment: Overall WFL for tasks assessed   Lower Extremity Assessment Lower Extremity Assessment: Defer to PT evaluation   Cervical / Trunk Assessment Cervical / Trunk Assessment: Kyphotic   Communication Communication Communication: HOH   Cognition Arousal/Alertness: Awake/alert Behavior During Therapy: WFL for tasks assessed/performed Overall Cognitive Status:  No family/caregiver present to determine baseline cognitive functioning                                 General Comments: following basic one step commands, HOH impacts.  Is oriented to person and place, but not time and situation.   General Comments       Exercises     Shoulder Instructions      Home Living Family/patient expects to be discharged to:: Private residence Living Arrangements: Spouse/significant other Available Help at Discharge: Family;Available 24 hours/day Type of Home: House Home Access: Stairs to enter [employer] of Steps: 2 Entrance Stairs-Rails: Right;Left;Can reach both Home Layout: One level     Bathroom Shower/Tub: Occupational psychologist: Handicapped height Bathroom Accessibility: Yes How Accessible: Accessible via walker Home Equipment: [place] - 2 wheels;Walker - 4 wheels          Prior Functioning/Environment          Comments: Unable to get a reliable prior of of function, or living environment given cognition.        OT Problem List: Decreased activity tolerance;Impaired balance (sitting and/or standing);Decreased safety awareness;Decreased cognition      OT Treatment/Interventions: Self-care/ADL training;Therapeutic exercise;Cognitive remediation/compensation;Therapeutic activities;Balance training;Patient/family education    OT Goals(Current goals can be found in the care plan section) Acute Rehab OT Goals Patient Stated Goal: none stated OT Goal Formulation: With patient Time For Goal Achievement: 02/26/21 Potential to Achieve Goals: Fair ADL Goals Pt Will Perform Grooming: with supervision;sitting Pt Will Perform Upper Body Bathing: with supervision;sitting Pt Will Perform Upper Body Dressing: with supervision;sitting Pt Will Transfer to Toilet: with min assist;ambulating;regular height toilet Pt Will Perform Toileting - Clothing Manipulation and hygiene: with min guard assist;sit to/from  stand  OT Frequency: Min 2X/week   Barriers to D/C:    none noted       Co-evaluation              AM-PAC OT "6 Clicks" Daily Activity     Outcome Measure Help from another person eating meals?: None Help from another person taking care of personal grooming?: None Help from another person toileting, which includes using toliet, bedpan, or urinal?: A Lot Help from another person bathing (including washing, rinsing, drying)?: A Lot Help from another person to put on and taking off regular upper body clothing?: A Little Help from another person to put on and taking off regular lower body clothing?: A Lot 6 Click Score: 17   End of Session    Activity Tolerance: Other (comment) (limited by cognition and male purewick) Patient left: in bed;with call bell/phone within reach  OT Visit Diagnosis: Other symptoms and signs involving cognitive function;Muscle weakness (generalized) (M62.81)                Time: 1540-0867 OT Time Calculation (min): 16 min Charges:  OT General Charges $OT Visit: 1 Visit OT Evaluation $OT Eval Moderate Complexity: 1 Mod  02/12/2021  RP, OTR/L  Acute Rehabilitation Services  Office:  [phone]   Janice Coffin  Kelissa Merlin 02/12/2021, 3:24 PM

## 2021-02-12 NOTE — Progress Notes (Signed)
Received a page from RN at 8:03 PM.  I called back immediately.  RN informed me that patient arrived to floor from the ED around 4 PM and was very cooperative at that time but his speech was slightly slurred.  Patient has now become increasingly more confused and combative. His speech is more slurred and also has right-sided facial droop.  He has a history of prior stroke.  Neurology notified and code stroke activated at 8:06 PM.

## 2021-02-12 NOTE — Consult Note (Signed)
NEUROLOGY CONSULTATION NOTE   Date of service: February 12, 2021 Patient Name: Tyler [name] MRN:  [id number] DOB:  07-01-1928 Reason for consult: "Stroke code for R facial droop and dysarthric speech" Requesting Provider: Rhetta Mura, DO _ _ _   _ __   _ __ _ _  __ __   _ __   __ _  History of Present Illness  Tyler [name] is a 87 y.o. male with PMH significant for DM2, HTN, nephrolithiasis, prior stroke in 2013 in the left lenticular nucleus extending into the posterior aspect of the left corona radiata who is currently admitted with AMS and aspiration pneumonia.  He was noted to have been becoming more and more confused. Was mildly dysarthric when he was taken to floor from the ED at 1600 and then as he evening set in, noted to have worsening dysarthria and a R facial droop.  A stroke code was activated.  mRS: 3 tNK/thrombectomy: not offered, low suspicion for stroke. Suspect this is recrudescence of prior stroke symptoms. NIHSS components Score: Comment  1a Level of Conscious 0[x]  1[]  2[]  3[]      1b LOC Questions 0[]  1[x]  2[]       1c LOC Commands 0[x]  1[]  2[]       2 Best Gaze 0[x]  1[]  2[]       3 Visual 0[x]  1[]  2[]  3[]      4 Facial Palsy 0[]  1[]  2[x]  3[]      5a Motor Arm - left 0[x]  1[]  2[]  3[]  4[]  UN[]    5b Motor Arm - Right 0[x]  1[]  2[]  3[]  4[]  UN[]    6a Motor Leg - Left 0[]  1[x]  2[]  3[]  4[]  UN[]    6b Motor Leg - Right 0[]  1[x]  2[]  3[]  4[]  UN[]    7 Limb Ataxia 0[x]  1[]  2[]  3[]  UN[]     8 Sensory 0[x]  1[]  2[]  UN[]      9 Best Language 0[]  1[x]  2[]  3[]      10 Dysarthria 0[]  1[x]  2[]  UN[]      11 Extinct. and Inattention 0[x]  1[]  2[]       TOTAL: 7      ROS   Unable to dget detailed ROS 2/2 encephalopathy. Denies any pain.  Past History   Past Medical History:  Diagnosis Date   Cancer [facility])    Prostate   Diabetes mellitus without complication ([place])    Hypertension    Kidney stones    Stroke [facility])    Past Surgical History:  Procedure Laterality Date   CARDIAC  CATHETERIZATION     cyst surgery on back     removed from end of spine 50 years ago   FEMUR IM NAIL Right 11/17/2015   Procedure: INTRAMEDULLARY (IM) NAIL FEMORAL;  Surgeon: Marybelle Killings, MD;  Location: [facility];  Service: Orthopedics;  Laterality: Right;   HIP PINNING,CANNULATED Left 10/12/2018   Procedure: Left hip pinning;  Surgeon: Gaynelle Arabian, MD;  Location: [facility];  Service: Orthopedics;  Laterality: Left;  6min   KNEE ARTHROSCOPY     both knees   PROSTATE SURGERY     SHOULDER SURGERY     right shoulder   Family History  Problem Relation Age of Onset   Emphysema Father    Heart Problems Mother    Social History   Socioeconomic History   Marital status: Married    Spouse name: Marland Mcalpine   Number of children: 2   Years of education: 12   Highest education level: Not on file  Occupational History  Occupation: retired    Fish farm manager: RETIRED    Comment: [employer]  Tobacco Use   Smoking status: Former    Types: Cigarettes    Quit date: 05/04/1968    Years since quitting: 52.8   Smokeless tobacco: Never  Vaping Use   Vaping Use: Never used  Substance and Sexual Activity   Alcohol use: No   Drug use: No   Sexual activity: Not on file  Other Topics Concern   Not on file  Social History Narrative   Patient lives at home with his wife Tyler [name]) retired. Patient has a high school education. Two Children . No caffeine. Right handed.   Social Determinants of Health   Financial Resource Strain: Not on file  Food Insecurity: Not on file  Transportation Needs: Not on file  Physical Activity: Not on file  Stress: Not on file  Social Connections: Not on file   Allergies  Allergen Reactions   Sulfa Drugs Cross Reactors Anaphylaxis and Rash    Lungs fill with fluid   Metformin Hcl Nausea Only    Medications   Medications Prior to Admission  Medication Sig Dispense Refill Last Dose   acetaminophen (TYLENOL) 650 MG CR tablet Take 650-1,300 mg by mouth 2 (two) times daily.    02/11/2021   amLODipine (NORVASC) 2.5 MG tablet Take 1 tablet (2.5 mg total) by mouth at bedtime. 30 tablet 0 02/10/2021   atorvastatin (LIPITOR) 40 MG tablet Take 40 mg by mouth daily.   02/11/2021   camphor-menthol (SARNA) lotion Apply topically as needed for itching. 222 mL 0 unk   carboxymethylcellulose (REFRESH PLUS) 0.5 % SOLN Place 1 drop into both eyes daily as needed (dry eyes).   Past Week   Cholecalciferol (VITAMIN D3) 50 MCG (2000 UT) TABS Take 2,000 Units by mouth daily.   02/11/2021   clopidogrel (PLAVIX) 75 MG tablet Take 1 tablet (75 mg total) by mouth daily with breakfast. 30 tablet 6 02/11/2021 at am   docusate sodium (COLACE) 100 MG capsule Take 100 mg by mouth at bedtime.   02/10/2021   glimepiride (AMARYL) 2 MG tablet Take 1 mg by mouth daily with breakfast.   02/11/2021   Melatonin 5 MG TABS Take 5 mg by mouth at bedtime.   02/10/2021   mirtazapine (REMERON) 15 MG tablet Take 15 mg by mouth at bedtime.   02/10/2021   Omega 3 1000 MG CAPS Take 1,000 mg by mouth at bedtime. W flaxseed oil   02/10/2021   pantoprazole (PROTONIX) 40 MG tablet Take 1 tablet (40 mg total) by mouth daily. (Patient taking differently: Take 40 mg by mouth at bedtime.) 30 tablet 0 02/10/2021   sertraline (ZOLOFT) 50 MG tablet Take 50 mg by mouth daily.   02/11/2021   Skin Protectants, Misc. (EUCERIN) cream Apply 1 application topically daily as needed for dry skin.   02/11/2021   vitamin B-12 (CYANOCOBALAMIN) 1000 MCG tablet Take 1,000 mcg by mouth at bedtime.   02/11/2021   Lancets (ONETOUCH ULTRASOFT) lancets       ONE TOUCH ULTRA TEST test strip         Vitals   Vitals:   02/12/21 1200 02/12/21 1536 02/12/21 1616 02/12/21 2014  BP: (!) 142/65  (!) 130/54 (!) 165/64  Pulse: 73 83 69 98  Resp: 17 15  18   Temp:   98.3 F (36.8 C) 97.7 F (36.5 C)  TempSrc:   Oral Oral  SpO2: 95% 97% 94% 98%  Weight:  75.3 kg   Height:   5\' 10"  (1.778 m)      Body mass index is 23.83 kg/m.  Physical  Exam   General: Laying comfortably in bed; in no acute distress.  HENT: Normal oropharynx and mucosa. Normal external appearance of ears and nose.  Neck: Supple, no pain or tenderness  CV: [name]. No peripheral edema.  Pulmonary: Symmetric Chest rise. Normal respiratory effort.  Abdomen: Soft to touch, non-tender.  Ext: No cyanosis, edema, or deformity  Skin: No rash. Normal palpation of skin.   Musculoskeletal: Normal digits and nails by inspection. No clubbing.   Neurologic Examination  Mental status/Cognition: Alert, oriented to self only, poor attention requires redirection constantly. Speech/language: Dysarthric speech, fluent, comprehension intact to simple commands, able to name some objects. Does perseverates. Cranial nerves:   CN II Pupils equal and reactive to light, no VF deficits    CN III,IV,VI EOM intact, no gaze preference or deviation, no nystagmus    CN V normal sensation in V1, V2, and V3 segments bilaterally    CN VII R facial droop   CN VIII normal hearing to speech   CN IX & X normal palatal elevation, no uvular deviation    CN XI 5/5 head turn and 5/5 shoulder shrug bilaterally    CN XII midline tongue protrusion    Motor:  Muscle bulk: poor, tone normal. Unable to do detailed strength testing 2/2 encephalopathy but he can keep both arms up off the bed for more than 10 secs. BL legs drift down but I think that is probably due to encephalopathy and poor attention.  Reflexes:  Right Left Comments  Pectoralis      Biceps (C5/6) 2 2   Brachioradialis (C5/6) 2 2    Triceps (C6/7) 2 2    Patellar (L3/4) 2 2    Achilles (S1)      Hoffman      Plantar     Jaw jerk    Sensation:  Light touch intact   Pin prick    Temperature    Vibration   Proprioception    Coordination/Complex Motor:  - Finger to Nose would not do but gives a high five BL with no obvious ataxia. - Heel to shin unable to get him to do - Rapid alternating movement unable to get him to  do - Gait: unsafe to do given his encephalopathy. Repeatedly attempts to get out of bed.  Labs   CBC:  Recent Labs  Lab 02/11/21 1830 02/12/21 0500 02/12/21 0508  WBC 15.5* 15.5*  --   NEUTROABS 14.1* 13.1*  --   HGB 10.3* 9.1* 9.2*  HCT 31.3* 27.3* 27.0*  MCV 99.7 98.9  --   PLT 152 139*  --     Basic Metabolic Panel:  Lab Results  Component Value Date   NA 138 02/12/2021   K 4.1 02/12/2021   CO2 21 (L) 02/12/2021   GLUCOSE 127 (H) 02/12/2021   BUN 25 (H) 02/12/2021   CREATININE 1.51 (H) 02/12/2021   CALCIUM 9.1 02/12/2021   GFRNONAA 44 (L) 02/12/2021   GFRAA 52 (L) 10/18/2018   Lipid Panel:  Lab Results  Component Value Date   LDLCALC 66 06/29/2016   HgbA1c:  Lab Results  Component Value Date   HGBA1C 6.9 (H) 02/12/2021   Urine Drug Screen: No results found for: LABOPIA, COCAINSCRNUR, LABBENZ, AMPHETMU, THCU, LABBARB  Alcohol Level No results found for: [facility]  CT Head without contrast: Personally reviewed  and CTH was negative for a large hypodensity concerning for a large territory infarct or hyperdensity concerning for an ICH  CT angio Head and Neck with contrast: No obvious LVO  MRI Brain: pending Impression   BRITON SELLMAN is a 87 y.o. male with PMH significant for DM2, HTN, nephrolithiasis, prior stroke in 2013 in the left lenticular nucleus extending into the posterior aspect of the left corona radiata who is currently admitted with AMS and aspiration pneumonia. He had dysarthric speech with R facial droop. I suspect that this is probably recrudescence of prior stroke symptoms.  CTH and CTA with no obivous large stroke, no LVO.  Impression: Recrudescence of prior stroke symptoms R facial droop Dysarthria Toxic metabolic encephalopathy Aspiration pneumonia.  Recommendations  - Would recommend getting an MRI Brain w/o contrast. If this is negative for an acute stroke probably this is from recrudescence of prior strokes symptoms and would not  recommend full stroke workup. ______________________________________________________________________  Plan discussed with Dr. Marlowe Sax with the Hospitalist team overnight.   Thank you for the opportunity to take part in the care of this patient. If you have any further questions, please contact the neurology consultation attending.  Signed,  [place] Pager Number [phone] _ _ _   _ __   _ __ _ _  __ __   _ __   __ _

## 2021-02-12 NOTE — Code Documentation (Signed)
Responded to code stroke called at 2009 for R facial droop and slurred speech, LKW 1600. Code stroke initiated by bedside RN. NIH 7, CTH negative for acute changes, CTA no LVO. No TNK given, low suspicion for stroke/recrudescence of prior stroke symptoms. Plan MRI.

## 2021-02-12 NOTE — Evaluation (Signed)
Clinical/Bedside Swallow Evaluation Patient Details  Name: Tyler [name] MRN: [id number] Date of Birth: 11/24/1928  Today's Date: 02/12/2021 Time: SLP Start Time (ACUTE ONLY): 34 SLP Stop Time (ACUTE ONLY): 1354 SLP Time Calculation (min) (ACUTE ONLY): 20 min  Past Medical History:  Past Medical History:  Diagnosis Date   Cancer ([place])    Prostate   Diabetes mellitus without complication ([place])    Hypertension    Kidney stones    Stroke [facility])    Past Surgical History:  Past Surgical History:  Procedure Laterality Date   CARDIAC CATHETERIZATION     cyst surgery on back     removed from end of spine 50 years ago   FEMUR IM NAIL Right 11/17/2015   Procedure: INTRAMEDULLARY (IM) NAIL FEMORAL;  Surgeon: Marybelle Killings, MD;  Location: [facility];  Service: Orthopedics;  Laterality: Right;   HIP PINNING,CANNULATED Left 10/12/2018   Procedure: Left hip pinning;  Surgeon: Gaynelle Arabian, MD;  [facility];  Service: Orthopedics;  Laterality: Left;  72min   KNEE ARTHROSCOPY     both knees   PROSTATE SURGERY     SHOULDER SURGERY     right shoulder   HPI:  87 y.o. male presented to ED from home with acute toxic metabolic encephalopathy due to aspiration pna.  Per notes, has had problems swallowing foods/liquids for several weeks. PMHx of type 2 diabetes mellitus, essential hypertension, hyperlipidemia, stage IIIa chronic kidney disease.    Assessment / Plan / Recommendation  Clinical Impression  Pt presented with functional swallowing.  Oral mechanism exam revealed presence of dentures; mild right CN VII asymmetry. Tongue midline. Pt described no recent difficulty with swallowing; he endorsed weight loss. There was evidence of short-term memory difficulty.  He was pleasant, interactive, and conversational; baseline HOH.  He demonstrated thorough mastication, the appearance of a brisk swallow response, and no s/s of aspiration when drinking thin liquids.  There was some occasional coughing noted  with dry crackers. No family at bedside.  Recommend resuming a regular diet with thin liquids.  Given reported hx of recent swallowing problems, recommend brief SLP f/u to address these reports as well as provide further education to pt.  D/W RN in ED. SLP Visit Diagnosis: Dysphagia, unspecified (R13.10)    Aspiration Risk  No limitations    Diet Recommendation    Regular solids, thin liquids Medication Administration: Whole meds with puree    Other  Recommendations Oral Care Recommendations: Oral care BID    Recommendations for follow up therapy are one component of a multi-disciplinary discharge planning process, led by the attending physician.  Recommendations may be updated based on patient status, additional functional criteria and insurance authorization.  Follow up Recommendations Other (comment)      Frequency and Duration min 1 x/week  1 week       Prognosis        Swallow Study   General Date of Onset: 02/11/21 HPI: 87 y.o. male presented to ED from home with acute toxic metabolic encephalopathy due to aspiration pna.  Per notes, has had problems swallowing foods/liquids for several weeks. PMHx of type 2 diabetes mellitus, essential hypertension, hyperlipidemia, stage IIIa chronic kidney disease. Type of Study: Bedside Swallow Evaluation Diet Prior to this Study: NPO Temperature Spikes Noted: No Respiratory Status: Room air History of Recent Intubation: No Behavior/Cognition: Alert;Cooperative Oral Cavity Assessment: Within Functional Limits Oral Care Completed by SLP: Recent completion by staff Oral Cavity - Dentition: Dentures, top;Dentures, bottom Vision:  Functional for self-feeding Self-Feeding Abilities: Able to feed self Patient Positioning: Upright in bed Baseline Vocal Quality: Normal Volitional Cough: Strong Volitional Swallow: Able to elicit    Oral/Motor/Sensory Function Overall Oral Motor/Sensory Function: Other (comment) (mild right facial  asymmetry lower face)   Ice Chips Ice chips: Within functional limits   Thin Liquid Thin Liquid: Within functional limits    Nectar Thick Nectar Thick Liquid: Not tested   Honey Thick Honey Thick Liquid: Not tested   Puree Puree: Within functional limits   Solid     Solid: Within functional limits      [name] 02/12/2021,3:09 PM Estill Bamberg L. [name], [place] Office number [phone] Pager [phone]

## 2021-02-13 LAB — COMPREHENSIVE METABOLIC PANEL
ALT: 18 U/L (ref 0–44)
AST: 23 U/L (ref 15–41)
Albumin: 2.9 g/dL — ABNORMAL LOW (ref 3.5–5.0)
Alkaline Phosphatase: 55 U/L (ref 38–126)
Anion gap: 8 (ref 5–15)
BUN: 25 mg/dL — ABNORMAL HIGH (ref 8–23)
CO2: 20 mmol/L — ABNORMAL LOW (ref 22–32)
Calcium: 8.9 mg/dL (ref 8.9–10.3)
Chloride: 106 mmol/L (ref 98–111)
Creatinine, Ser: 1.6 mg/dL — ABNORMAL HIGH (ref 0.61–1.24)
GFR, Estimated: 41 mL/min — ABNORMAL LOW (ref >60)
Glucose, Bld: 178 mg/dL — ABNORMAL HIGH (ref 70–99)
Potassium: 3.7 mmol/L (ref 3.5–5.1)
Sodium: 134 mmol/L — ABNORMAL LOW (ref 135–145)
Total Bilirubin: 0.9 mg/dL (ref 0.3–1.2)
Total Protein: 5.3 g/dL — ABNORMAL LOW (ref 6.5–8.1)

## 2021-02-13 LAB — CBC WITH DIFFERENTIAL/PLATELET
Abs Immature Granulocytes: 0.05 10*3/uL (ref 0.00–0.07)
Basophils Absolute: 0 10*3/uL (ref 0.0–0.1)
Basophils Relative: 0 %
Eosinophils Absolute: 0 10*3/uL (ref 0.0–0.5)
Eosinophils Relative: 0 %
HCT: 23.4 % — ABNORMAL LOW (ref 39.0–52.0)
Hemoglobin: 8.1 g/dL — ABNORMAL LOW (ref 13.0–17.0)
Immature Granulocytes: 1 %
Lymphocytes Relative: 8 %
Lymphs Abs: 0.8 10*3/uL (ref 0.7–4.0)
MCH: 33.2 pg (ref 26.0–34.0)
MCHC: 34.6 g/dL (ref 30.0–36.0)
MCV: 95.9 fL (ref 80.0–100.0)
Monocytes Absolute: 0.6 10*3/uL (ref 0.1–1.0)
Monocytes Relative: 6 %
Neutro Abs: 8.3 10*3/uL — ABNORMAL HIGH (ref 1.7–7.7)
Neutrophils Relative %: 85 %
Platelets: 127 10*3/uL — ABNORMAL LOW (ref 150–400)
RBC: 2.44 MIL/uL — ABNORMAL LOW (ref 4.22–5.81)
RDW: 13.8 % (ref 11.5–15.5)
WBC: 9.9 10*3/uL (ref 4.0–10.5)
nRBC: 0 % (ref 0.0–0.2)

## 2021-02-13 LAB — GLUCOSE, CAPILLARY
Glucose-Capillary: 182 mg/dL — ABNORMAL HIGH (ref 70–99)
Glucose-Capillary: 163 mg/dL — ABNORMAL HIGH (ref 70–99)
Glucose-Capillary: 181 mg/dL — ABNORMAL HIGH (ref 70–99)
Glucose-Capillary: 153 mg/dL — ABNORMAL HIGH (ref 70–99)

## 2021-02-13 LAB — BRAIN NATRIURETIC PEPTIDE: B Natriuretic Peptide: 489.5 pg/mL — ABNORMAL HIGH (ref 0.0–100.0)

## 2021-02-13 LAB — URINE CULTURE: Culture: <10000 — AB

## 2021-02-13 LAB — C-REACTIVE PROTEIN: CRP: 18.7 mg/dL — ABNORMAL HIGH (ref <1.0)

## 2021-02-13 LAB — PROCALCITONIN: Procalcitonin: 6.51 ng/mL

## 2021-02-13 LAB — MAGNESIUM: Magnesium: 1.9 mg/dL (ref 1.7–2.4)

## 2021-02-13 MED ORDER — KCL-LACTATED RINGERS-D5W 20 MEQ/L IV SOLN: INTRAVENOUS | Status: DC

## 2021-02-13 MED ORDER — QUETIAPINE FUMARATE 25 MG PO TABS
25.0000 mg | ORAL_TABLET | Freq: Every day | ORAL | Status: DC
  Administered 2021-02-13: 25 mg via ORAL
  Filled 2021-02-13: qty 1

## 2021-02-13 MED ORDER — HALOPERIDOL LACTATE 5 MG/ML IJ SOLN
2.0000 mg | Freq: Once | INTRAMUSCULAR | Status: AC | PRN
  Administered 2021-02-13: 2 mg via INTRAVENOUS
  Filled 2021-02-13: qty 1

## 2021-02-13 MED ORDER — SODIUM CHLORIDE 0.9 % IV SOLN
1.5000 g | Freq: Four times a day (QID) | INTRAVENOUS | Status: DC
  Administered 2021-02-13 – 2021-02-17 (×16): 1.5 g via INTRAVENOUS
  Filled 2021-02-13 (×18): qty 4

## 2021-02-13 NOTE — Progress Notes (Signed)
Speech Language Pathology Treatment: Dysphagia  Patient Details Name: Tyler [name] MRN: [id number] DOB: 1929-04-12 Today's Date: 02/13/2021 Time: 6767-2094 SLP Time Calculation (min) (ACUTE ONLY): 33 min  Assessment / Plan / Recommendation Clinical Impression  Pt admitted to [facility] - code stroke last night, CT head and MRI negative for acute event. He has a mild right lower facial asymmetry that is consistent with yesterday's presentation. Pt much more confused today than when evaluated yesterday in ED. However, after set-up of breakfast tray, he was still able to feed himself and demonstrated no deterioration in swallow function.  Presented with thorough mastication, brisk swallow, no s/s of aspiration. His wife and son were at bedside. [name] reports some changes in swallowing lately, related primarily to difficulty chewing tougher food items, crust of bread, etc.  Overall, his swallowing abilities appear to be quite functional.    HPI HPI: 87 y.o. male presented to ED from home with acute toxic metabolic encephalopathy due to aspiration pna.  Per notes, has had problems swallowing foods/liquids for several weeks. PMHx of type 2 diabetes mellitus, essential hypertension, hyperlipidemia, stage IIIa chronic kidney disease.      SLP Plan  Goals met; no f/u needed      Recommendations for follow up therapy are one component of a multi-disciplinary discharge planning process, led by the attending physician.  Recommendations may be updated based on patient status, additional functional criteria and insurance authorization.    Recommendations  Diet recommendations: Regular;Thin liquid Liquids provided via: Cup;Straw Medication Administration: Whole meds with puree Supervision: Patient able to self feed (needs set up) Postural Changes and/or Swallow Maneuvers: Seated upright 90 degrees                Oral Care Recommendations: Oral care BID Follow up Recommendations: None SLP Visit  Diagnosis: Dysphagia, unspecified (R13.10) Plan: Goal met, sign off                      [name] L. [name], [place] CCC/SLP Acute Rehabilitation Services Office number [phone] Pager [phone]  [name] 02/13/2021, 11:18 AM

## 2021-02-13 NOTE — Progress Notes (Signed)
2331:Pt asymptomatic with B/P =179/75 2344: Given PRN Hydralazine IV 10mg  (SBP>150). RN will continue to monitor.

## 2021-02-13 NOTE — Plan of Care (Signed)
  Problem: Clinical Measurements: Goal: Will remain free from infection Outcome: Progressing Goal: Diagnostic test results will improve Outcome: Progressing Goal: Respiratory complications will improve Outcome: Progressing Goal: Cardiovascular complication will be avoided Outcome: Progressing   Problem: Nutrition: Goal: Adequate nutrition will be maintained Outcome: Progressing   Problem: Safety: Goal: Ability to remain free from injury will improve Outcome: Progressing   Problem: Skin Integrity: Goal: Risk for impaired skin integrity will decrease Outcome: Progressing

## 2021-02-13 NOTE — Evaluation (Signed)
Physical Therapy Evaluation Patient Details Name: Tyler [name] MRN: [id number] DOB: [birth year] Today's Date: 02/13/2021  History of Present Illness  87 y.o. male with medical history significant for type 2 diabetes mellitus, essential hypertension, hyperlipidemia, stage IIIa chronic kidney disease with baseline creatinine reported 1.7, anemia, who was admitted with acute metabolic encephalopathy.  Clinical Impression  Patient presents with general weakness, decreased balance and activity tolerance limiting independence and safety with mobility.  His wife and son are present, though and reports he is much improved and feel not too far from his baseline.  Feel he will need follow up HHPT.  PT will follow acutely.        Recommendations for follow up therapy are one component of a multi-disciplinary discharge planning process, led by the attending physician.  Recommendations may be updated based on patient status, additional functional criteria and insurance authorization.  Follow Up Recommendations Home health PT;Supervision/Assistance - 24 hour;Supervision for mobility/OOB    Equipment Recommendations  None recommended by PT    Recommendations for Other Services       Precautions / Restrictions Precautions Precautions: Fall      Mobility  Bed Mobility Overal bed mobility: Needs Assistance Bed Mobility: Supine to Sit;Sit to Supine     Supine to sit: Min assist Sit to supine: Min assist   General bed mobility comments: assist for lifting and pt needing cues, assist and increased time for positioning in bed after lifting legs    Transfers Overall transfer level: Needs assistance Equipment used: Rolling walker (2 wheeled) Transfers: Sit to/from Stand Sit to Stand: Mod assist         General transfer comment: steadying while pt lifting, but at least 30% help for keeping anterior weight shift  Ambulation/Gait Ambulation/Gait assistance: Min assist Gait Distance (Feet):  15 Feet Assistive device: Rolling walker (2 wheeled) Gait Pattern/deviations: Step-to pattern;Decreased stride length;Decreased dorsiflexion - right;Shuffle;Wide base of support;Trunk flexed     General Gait Details: slow pace, R foot dragging a lot despite cues (son reports not new)  Stairs            Wheelchair Mobility    Modified Rankin (Stroke Patients Only) Modified Rankin (Stroke Patients Only) Pre-Morbid Rankin Score: Moderate disability Modified Rankin: Moderately severe disability     Balance Overall balance assessment: Needs assistance Sitting-balance support: Feet supported;Single extremity supported;No upper extremity supported Sitting balance-Leahy Scale: Fair Sitting balance - Comments: can sit without UE support, but holding onto EOB with 1 UE support briefly   Standing balance support: Bilateral upper extremity supported Standing balance-Leahy Scale: Poor Standing balance comment: min A and UE support for balance in standing                             Pertinent Vitals/Pain Pain Assessment: Faces Faces Pain Scale: No hurt    Home Living Family/patient expects to be discharged to:: Private residence Living Arrangements: Spouse/significant other Available Help at Discharge: Family;Available 24 hours/day Type of Home: House Home Access: Stairs to enter   [employer] of Steps: 2 back has level entry Home Layout: Multi-level Home Equipment: Walker - 2 wheels;Walker - 4 wheels;Shower seat;Grab bars - tub/shower Additional Comments: vanity beside toilet    Prior Function Level of Independence: Needs assistance   Gait / Transfers Assistance Needed: walks with walker and climbs stairs on his own  ADL's / Homemaking Assistance Needed: wife assists with shower and pt dresses himself  Hand Dominance   Dominant Hand: Right    Extremity/Trunk Assessment   Upper Extremity Assessment Upper Extremity Assessment:  Generalized weakness    Lower Extremity Assessment Lower Extremity Assessment: RLE deficits/detail;LLE deficits/detail RLE Deficits / Details: generalized stiffness, overall strength grossly 3+/5 LLE Deficits / Details: generalized stiffness, overall strength grossly 3+/5    Cervical / Trunk Assessment Cervical / Trunk Assessment: Kyphotic  Communication   Communication: HOH  Cognition Arousal/Alertness: Lethargic;Awake/alert Behavior During Therapy: Flat affect Overall Cognitive Status: Impaired/Different from baseline Area of Impairment: Attention;Memory;Following commands;Problem solving;Safety/judgement                   Current Attention Level: Sustained Memory: Decreased short-term memory Following Commands: Follows one step commands with increased time;Follows one step commands consistently Safety/Judgement: Decreased awareness of deficits;Decreased awareness of safety   Problem Solving: Slow processing;Decreased initiation;Difficulty sequencing;Requires verbal cues;Requires tactile cues General Comments: family (wife & son) in the room, pt initially sleepy, but improved with mobilizing, hearing impacts ability to follow commands      General Comments General comments (skin integrity, edema, etc.): wife and son in the room and provide history    Exercises     Assessment/Plan    PT Assessment Patient needs continued PT services  PT Problem List Decreased strength;Decreased mobility;Decreased activity tolerance;Decreased balance;Decreased knowledge of use of DME       PT Treatment Interventions DME instruction;Therapeutic activities;Gait training;Therapeutic exercise;Patient/family education;Balance training;Functional mobility training;Stair training    PT Goals (Current goals can be found in the Care Plan section)  Acute Rehab PT Goals Patient Stated Goal: to go home PT Goal Formulation: With patient/family Time For Goal Achievement: 02/20/21 Potential to  Achieve Goals: Good    Frequency Min 3X/week   Barriers to discharge        Co-evaluation               AM-PAC PT "6 Clicks" Mobility  Outcome Measure Help needed turning from your back to your side while in a flat bed without using bedrails?: A Little Help needed moving from lying on your back to sitting on the side of a flat bed without using bedrails?: A Little Help needed moving to and from a bed to a chair (including a wheelchair)?: A Lot Help needed standing up from a chair using your arms (e.g., wheelchair or bedside chair)?: A Lot Help needed to walk in hospital room?: A Little Help needed climbing 3-5 steps with a railing? : A Lot 6 Click Score: 15    End of Session   Activity Tolerance: Patient limited by fatigue Patient left: in bed;with call bell/phone within reach;with bed alarm set;with family/visitor present   PT Visit Diagnosis: Other abnormalities of gait and mobility (R26.89);Other symptoms and signs involving the nervous system (R29.898);Muscle weakness (generalized) (M62.81)    Time: 0814-4818 PT Time Calculation (min) (ACUTE ONLY): 25 min   Charges:   PT Evaluation $PT Eval Moderate Complexity: 1 Mod PT Treatments $Gait Training: 8-22 mins        Magda Kiel, PT Acute Rehabilitation Services HUDJS:[phone] Office:[phone] 02/13/2021   Reginia Naas 02/13/2021, 2:26 PM

## 2021-02-13 NOTE — Progress Notes (Signed)
Pharmacy Antibiotic Note  Tyler [name] is a 87 y.o. male admitted on 02/11/2021 and now with concern for aspiration PNA. Pharmacy has been consulted for Unasyn dosing.  SCr 1.6, estimated CrCl~30-35 ml/min. Will adjust Unasyn dose today.   Plan: - Adjust Unasyn dose to 1.5g IV every 6 hours - Will continue to follow renal function, culture results, LOT, and antibiotic de-escalation plans   Height: 5\' 10"  (177.8 cm) Weight: 75.5 kg (166 lb 7.2 oz) IBW/kg (Calculated) : 73  Temp (24hrs), Avg:98.1 F (36.7 C), Min:97.6 F (36.4 C), Max:99.1 F (37.3 C)  Recent Labs  Lab 02/11/21 1830 02/11/21 2019 02/12/21 0500 02/13/21 0330  WBC 15.5*  --  15.5* 9.9  CREATININE 1.58*  --  1.51* 1.60*  LATICACIDVEN 1.8 1.3  --   --      Estimated Creatinine Clearance: 31.7 mL/min (A) (by C-G formula based on SCr of 1.6 mg/dL (H)).    Allergies  Allergen Reactions   Sulfa Drugs Cross Reactors Anaphylaxis and Rash    Lungs fill with fluid   Metformin Hcl Nausea Only    Antimicrobials this admission: Vancomycin 10/11>>12 Cefepime 10/11>>12 Flagyl 10/11>>12 Unasyn 10/12>>  Microbiology results: 10/11 BCx: ngtd 10/11 UCx: <10k insignificant  Thank you for allowing pharmacy to be a part of this patient's care.  Alycia Rossetti, PharmD, BCPS Clinical Pharmacist Clinical phone for 02/13/2021: [phone] 02/13/2021 2:04 PM   **Pharmacist phone directory can now be found on [email or web address] ([email or web address]).  Listed under [place].

## 2021-02-13 NOTE — Progress Notes (Signed)
Haldol calmed him down for about 45 min to and hour.  He is now calmer but still thinks he needs to get up to go to work and is attempting to get out of the bed.  Has the fall bundle implemented, has on mittens to prevent from pulling out IV again, and is lying in bed.  This nurse did order a sitter for safety, as he is calmer with someone at his bedside.

## 2021-02-13 NOTE — Progress Notes (Signed)
Brief Neuro Update:  MRI Brain is negative for an acute stroke. I think this is all probably recrudescence of prior stroke symptoms in the setting of aspiration pneumonia and encephalopathy. Spinal cord will not cause facial droop or dysarthria. We will signoff on him. No further inpatient neurological workup at this time. Please feel free to contact us with any questions or concerns.  [place] Pager Number [phone]

## 2021-02-13 NOTE — Progress Notes (Addendum)
Notified Dr that pt is confused and trying to climb out of bed constantly.  Has also been yelling and attempting to swat at staff when they try to keep him in the bed.  All diversional activity has failed and it is my fear that he will fall out of bed because he is not redirectable at this time.   Dr gave an order for Haldol 2 mg IV one time.

## 2021-02-13 NOTE — Progress Notes (Signed)
PROGRESS NOTE                                                                                                                                                                                                             Patient Demographics:    Tyler [name], is a 87 y.o. male, DOB - August 28, 1928, IDP:[id number]  Outpatient Primary MD for the patient is [name], MD    LOS - 2  Admit date - 02/11/2021    Chief Complaint  Patient presents with   Weakness       Brief Narrative (HPI from H&P)   Tyler [name] is a 87 y.o. male with medical history significant for type 2 diabetes mellitus, essential hypertension, hyperlipidemia, stage IIIa chronic kidney disease with baseline creatinine reported 1.7, anemia of chronic disease with baseline hemoglobin 9-12, who presented from home to [facility] ED for evaluation of altered mental status, had Asp. PNA.   Subjective:   Patient in bed appears to be in no distress but currently confused, unreliable historian but denies any headache chest or abdominal pain.   Assessment  & Plan :      Acute Toxic encephalopathy due to aspiration pneumonia and now with delirium - had detailed discussion with patient's son and life, currently has been having problems swallowing food or liquids for several weeks, at this time will taper his medications down to IV Unasyn, gentle IV fluid, speech eval and treat and NPO.  Head CT & MRI are unremarkable and he has no focal neurological deficits.  He does not have sepsis.  Will monitor closely, minimize benzodiazepines and narcotics, nighttime Seroquel and as needed Haldol .  Encouraged the patient to sit up in chair in the daytime use I-S and flutter valve for pulmonary toiletry.  Will advance activity and titrate down oxygen as possible.   2.  Weakness and deconditioning due to #1 above.  PT OT and monitor.  3.  Insignificant mild elevation in troponin patient  ACS pattern.  Chest pain-free.  EKG is nonacute, continue statin and Plavix for secondary prevention.  No further work-up.  Mild troponin rise likely underlying infection.  4.  Essential hypertension.  Blood pressure is stable off medications for now only with as needed hydralazine.  5.  Dyslipidemia.  Statin.  6.  CKD 3B.  Baseline creatinine around  1.5.  Stable.  Monitor with gentle hydration.  7.  GERD.  Placed on Pepcid.  8.  AOCD.  Stable no acute issue.  9.  History of CVA in the past.  No new focal deficits, head CT stable, no headache, continue Plavix and statin for secondary prevention.  10.  Hypomagnesemia.  Replaced.    11. DM type II.  Sliding scale.  This SmartLink has not been configured with any valid records.   CBG (last 3)  Recent Labs    02/12/21 2107 02/13/21 0621 02/13/21 1244  GLUCAP 184* 182* 163*         Condition -  Guarded  Family Communication  :    [name] [phone] 02/12/21 at 8.54 am message left  Called son Jermain [phone] + Wife 02/12/21 updated, son updated 02/14/20   Code Status :  DNR  Consults  :  None  PUD Prophylaxis :    Procedures  :     MRI brain - Non acute  CT Head - 1. Stable chronic ischemic changes as above. No acute intracranial process  CT ABD  - Pelvis - No acute intra-abdominal pathology identified. No definite radiographic explanation for the patient's reported symptoms. Patchy infiltrate within the visualized right lung base, possibly infectious or inflammatory. Stable mild left nonobstructing nephrolithiasis. Indeterminate low-attenuation exophytic lesion arising from the upper pole of the right kidney, stable since prior examination of 01/22/2015. Its stability over time, however, favors a hyperdense renal cyst. Aortic Atherosclerosis (ICD10-I70.0).      Disposition Plan  :    Status is: Inpatient  Remains inpatient appropriate because:IV treatments appropriate due to intensity of illness  or inability to take PO  Dispo: The patient is from: Home              Anticipated d/c is to: SNF              Patient currently is not medically stable to d/c.   Difficult to place patient No  DVT Prophylaxis  :    enoxaparin (LOVENOX) injection 30 mg Start: 02/12/21 1600 SCDs Start: 02/11/21 2144  Lab Results  Component Value Date   PLT 127 (L) 02/13/2021    Diet :  Diet Order             Diet regular Room service appropriate? Yes with Assist; Fluid consistency: Thin  Diet effective now                    Inpatient Medications  Scheduled Meds:  atorvastatin  40 mg Oral Daily   clopidogrel  75 mg Oral Q breakfast   docusate sodium  100 mg Oral QHS   enoxaparin (LOVENOX) injection  30 mg Subcutaneous Q24H   famotidine  40 mg Oral Daily   insulin aspart  0-6 Units Subcutaneous TID WC   QUEtiapine  25 mg Oral QHS   vitamin B-12  1,000 mcg Oral QHS   Continuous Infusions:  ampicillin-sulbactam (UNASYN) IV     dextrose 5% lactated ringers with KCl 20 mEq/L     PRN Meds:.acetaminophen **OR** acetaminophen, hydrALAZINE  Antibiotics  :    Anti-infectives (From admission, onward)    Start     Dose/Rate Route Frequency Ordered Stop   02/13/21 1600  ampicillin-sulbactam (UNASYN) 1.5 g in sodium chloride 0.9 % 100 mL IVPB        1.5 g 200 mL/hr over 30 Minutes Intravenous Every 6 hours 02/13/21 1359  02/12/21 2100  vancomycin (VANCOREADY) IVPB 750 mg/150 mL  Status:  Discontinued        750 mg 150 mL/hr over 60 Minutes Intravenous Every 24 hours 02/11/21 2037 02/12/21 0902   02/12/21 0930  ampicillin-sulbactam (UNASYN) 1.5 g in sodium chloride 0.9 % 100 mL IVPB  Status:  Discontinued        1.5 g 200 mL/hr over 30 Minutes Intravenous Every 12 hours 02/12/21 0916 02/13/21 1359   02/12/21 0700  ceFEPIme (MAXIPIME) 2 g in sodium chloride 0.9 % 100 mL IVPB  Status:  Discontinued        2 g 200 mL/hr over 30 Minutes Intravenous Every 12 hours 02/11/21 2042 02/12/21  0902   02/12/21 0400  ceFEPIme (MAXIPIME) 2 g in sodium chloride 0.9 % 100 mL IVPB  Status:  Discontinued        2 g 200 mL/hr over 30 Minutes Intravenous Every 8 hours 02/11/21 2037 02/11/21 2042   02/11/21 1845  ceFEPIme (MAXIPIME) 2 g in sodium chloride 0.9 % 100 mL IVPB        2 g 200 mL/hr over 30 Minutes Intravenous  Once 02/11/21 1833 02/11/21 1934   02/11/21 1845  metroNIDAZOLE (FLAGYL) IVPB 500 mg        500 mg 100 mL/hr over 60 Minutes Intravenous  Once 02/11/21 1833 02/11/21 1950   02/11/21 1845  vancomycin (VANCOREADY) IVPB 1500 mg/300 mL        1,500 mg 150 mL/hr over 120 Minutes Intravenous  Once 02/11/21 1833 02/11/21 2320        Time Spent in minutes  30   [name] M.D on 02/13/2021 at 2:37 PM  To page go to [email or web address]   [facility] -  Office  [phone]  See all Orders from today for further details    Objective:   Vitals:   02/13/21 0736 02/13/21 0819 02/13/21 1006 02/13/21 1206  BP: (!) 133/58 (!) 116/41 (!) 138/49 (!) 157/57  Pulse: 84 97 96 97  Resp: (!) 24 19  (!) 24  Temp: 97.6 F (36.4 C) 98.9 F (37.2 C)  99.1 F (37.3 C)  TempSrc: Tympanic Axillary  Oral  SpO2: 100% 100%  100%  Weight:      Height:        Wt Readings from Last 3 Encounters:  02/13/21 75.5 kg  10/12/18 70.3 kg  07/11/18 74.4 kg     Intake/Output Summary (Last 24 hours) at 02/13/2021 1437 Last data filed at 02/13/2021 1234 Gross per 24 hour  Intake 390 ml  Output 2100 ml  Net -1710 ml     Physical Exam  Awake but confused, No new F.N deficits,   [place].AT,[name], [name],   Symmetrical Chest wall movement, Good air movement bilaterally, CTAB RRR,No Gallops, Rubs or new Murmurs,  +ve B.Sounds, Abd Soft, No tenderness,   No Cyanosis, Clubbing or edema,         Data Review:    CBC Recent Labs  Lab 02/11/21 1830 02/12/21 0500 02/12/21 0508 02/13/21 0330  WBC 15.5* 15.5*  --  9.9  HGB 10.3* 9.1* 9.2* 8.1*  HCT 31.3* 27.3*  27.0* 23.4*  PLT 152 139*  --  127*  MCV 99.7 98.9  --  95.9  MCH 32.8 33.0  --  33.2  MCHC 32.9 33.3  --  34.6  RDW 14.0 13.8  --  13.8  LYMPHSABS 0.5* 1.4  --  0.8  MONOABS 0.8 0.9  --  0.6  EOSABS 0.0 0.0  --  0.0  BASOSABS 0.0 0.0  --  0.0    Recent Labs  Lab 02/11/21 1830 02/11/21 2019 02/12/21 0500 02/12/21 0508 02/12/21 0730 02/12/21 0911 02/13/21 0330  NA 137  --  137 138  --   --  134*  K 4.2  --  4.1 4.1  --   --  3.7  CL 104  --  106  --   --   --  106  CO2 21*  --  21*  --   --   --  20*  GLUCOSE 200*  --  127*  --   --   --  178*  BUN 24*  --  25*  --   --   --  25*  CREATININE 1.58*  --  1.51*  --   --   --  1.60*  CALCIUM 9.7  --  9.1  --   --   --  8.9  AST 36  --  20  --   --   --  23  ALT 27  --  23  --   --   --  18  ALKPHOS 79  --  58  --   --   --  55  BILITOT 1.6*  --  1.1  --   --   --  0.9  ALBUMIN 4.0  --  3.2*  --   --   --  2.9*  MG  --   --  1.4*  --   --   --  1.9  CRP  --   --   --   --   --   --  18.7*  PROCALCITON  --   --   --   --  7.52  --  6.51  LATICACIDVEN 1.8 1.3  --   --   --   --   --   INR  --   --  1.2  --   --   --   --   TSH  --   --   --   --  2.050  --   --   HGBA1C  --   --   --   --   --  6.9*  --   BNP  --   --   --   --   --   --  489.5*    ------------------------------------------------------------------------------------------------------------------ No results for input(s): CHOL, HDL, LDLCALC, TRIG, CHOLHDL, LDLDIRECT in the last 72 hours.  Lab Results  Component Value Date   HGBA1C 6.9 (H) 02/12/2021   ------------------------------------------------------------------------------------------------------------------ Recent Labs    02/12/21 0730  TSH 2.050    Cardiac Enzymes No results for input(s): CKMB, TROPONINI, MYOGLOBIN in the last 168 hours.  Invalid input(s): CK ------------------------------------------------------------------------------------------------------------------    Component  Value Date/Time   BNP 489.5 (H) 02/13/2021 0330     Radiology Reports CT HEAD WO CONTRAST (5MM)  Result Date: 02/11/2021 CLINICAL DATA:  Weakness, fatigue EXAM: CT HEAD WITHOUT CONTRAST TECHNIQUE: Contiguous axial images were obtained from the base of the skull through the vertex without intravenous contrast. COMPARISON:  10/18/2018 FINDINGS: Brain: Chronic small vessel ischemic changes are again seen throughout the periventricular white matter, stable. No signs of acute infarct or hemorrhage. Lateral ventricles and remaining midline structures are unremarkable. There are no acute extra-axial fluid collections. No mass effect. Vascular: No hyperdense vessel or unexpected calcification. Skull: Normal. Negative for fracture or focal lesion.  Sinuses/Orbits: Mild mucosal thickening within the ethmoid air cells. Remaining paranasal sinuses are clear. Other: None. IMPRESSION: 1. Stable chronic ischemic changes as above. No acute intracranial process. Electronically Signed   By: Randa Ngo M.D.   On: 02/11/2021 20:44   MR BRAIN WO CONTRAST  Result Date: 02/12/2021 CLINICAL DATA:  Acute neurologic deficit EXAM: MRI HEAD WITHOUT CONTRAST TECHNIQUE: Multiplanar, multiecho pulse sequences of the brain and surrounding structures were obtained without intravenous contrast. COMPARISON:  None. FINDINGS: Brain: No acute infarct, mass effect or extra-axial collection. No acute or chronic hemorrhage. There is multifocal hyperintense T2-weighted signal within the white matter. Diffuse, severe atrophy. The midline structures are normal. Vascular: Major flow voids are preserved. Skull and upper cervical spine: Normal calvarium and skull base. Visualized upper cervical spine and soft tissues are normal. Sinuses/Orbits:No paranasal sinus fluid levels or advanced mucosal thickening. No mastoid or middle ear effusion. Normal orbits. IMPRESSION: 1. No acute intracranial abnormality. 2. Diffuse, severe atrophy and findings  of chronic microvascular ischemia. Electronically Signed   By: Ulyses Jarred M.D.   On: 02/12/2021 23:33   CT ABDOMEN PELVIS W CONTRAST  Result Date: 02/11/2021 CLINICAL DATA:  Nausea, vomiting, anorexia, oliguria. Abdominal pain and back pain. EXAM: CT ABDOMEN AND PELVIS WITH CONTRAST TECHNIQUE: Multidetector CT imaging of the abdomen and pelvis was performed using the standard protocol following bolus administration of intravenous contrast. CONTRAST:  46mL OMNIPAQUE IOHEXOL 350 MG/ML SOLN COMPARISON:  07/12/2020 FINDINGS: Lower chest: Patchy infiltrate is seen within the a posterior basal right lower lobe, possibly infectious or inflammatory in nature. Mild left basilar atelectasis. No pleural effusion. Cardiac size within normal limits. No pericardial effusion. Hepatobiliary: No focal liver abnormality is seen. Status post cholecystectomy. No biliary dilatation. Pancreas: Unremarkable Spleen: Unremarkable Adrenals/Urinary Tract: The adrenal glands are unremarkable. The kidneys are normal in size and position. Stable 7 mm nonobstructing calculus within the lower pole the left kidney. No ureteral calculi. No hydronephrosis. Multiple simple bilateral cortical cysts are again identified. There is a stable 20 mm partially exophytic low-attenuation lesion arising from the upper pole of the right kidney which may represent a hyperdense renal cyst or cystic renal mass, but is not well characterized on this single-phase examination. This appears stable, however, since remote prior examination of 01/22/2015 and its stability over time favors a benign lesion. The bladder is unremarkable. Stomach/Bowel: Stomach is within normal limits. Appendix appears normal. No evidence of bowel wall thickening, distention, or inflammatory changes. Vascular/Lymphatic: Aortic atherosclerosis. No enlarged abdominal or pelvic lymph nodes. Reproductive: Prostatectomy and pelvic lymph node dissection has been performed. No recurrent mass  lesion. Other: No abdominal wall hernia.  Rectum unremarkable. Musculoskeletal: Bilateral hip ORIF has been performed. Healed fractures the right superior and inferior pubic rami noted. Degenerative changes are seen within the lumbar spine. No acute bone abnormality. No lytic or blastic bone lesion. IMPRESSION: No acute intra-abdominal pathology identified. No definite radiographic explanation for the patient's reported symptoms. Patchy infiltrate within the visualized right lung base, possibly infectious or inflammatory. Stable mild left nonobstructing nephrolithiasis. Indeterminate low-attenuation exophytic lesion arising from the upper pole of the right kidney, stable since prior examination of 01/22/2015. Its stability over time, however, favors a hyperdense renal cyst. Aortic Atherosclerosis (ICD10-I70.0). Electronically Signed   By: Fidela Salisbury M.D.   On: 02/11/2021 20:55   DG Chest Portable 1 View  Result Date: 02/11/2021 CLINICAL DATA:  Weakness, chest pain EXAM: PORTABLE CHEST 1 VIEW COMPARISON:  Chest x-ray 10/14/2018 FINDINGS: The heart  and mediastinal contours are within normal limits. Vague right perihilar airspace density may be due to rotation. No pulmonary edema. No pleural effusion. No pneumothorax. No acute osseous abnormality. IMPRESSION: Low lung volumes with vague right perihilar airspace density may be due to rotation. Otherwise no acute cardiopulmonary abnormality. Electronically Signed   By: Iven Finn M.D.   On: 02/11/2021 19:15   CT HEAD CODE STROKE WO CONTRAST  Result Date: 02/12/2021 CLINICAL DATA:  Facial droop and dysarthria EXAM: CT HEAD WITHOUT CONTRAST CT ANGIOGRAPHY OF THE HEAD AND NECK TECHNIQUE: Contiguous axial images were obtained from the base of the skull through the vertex without intravenous contrast. Multidetector CT imaging of the head and neck was performed using the standard protocol during bolus administration of intravenous contrast. Multiplanar CT  image reconstructions and MIPs were obtained to evaluate the vascular anatomy. Carotid stenosis measurements (when applicable) are obtained utilizing NASCET criteria, using the distal internal carotid diameter as the denominator. CONTRAST:  4mL OMNIPAQUE IOHEXOL 350 MG/ML SOLN COMPARISON:  None. FINDINGS: CT HEAD FINDINGS Brain: There is no mass, hemorrhage or extra-axial collection. There is generalized atrophy without lobar predilection. There is hypoattenuation of the periventricular white matter, most commonly indicating chronic ischemic microangiopathy. Vascular: No abnormal hyperdensity of the major intracranial arteries or dural venous sinuses. No intracranial atherosclerosis. Skull: The visualized skull base, calvarium and extracranial soft tissues are normal. Sinuses/Orbits: No fluid levels or advanced mucosal thickening of the visualized paranasal sinuses. No mastoid or middle ear effusion. The orbits are normal. ASPECTS ([place] Stroke Program Early CT Score) - Ganglionic level infarction (caudate, lentiform nuclei, internal capsule, insula, M1-M3 cortex): 7 - Supraganglionic infarction (M4-M6 cortex): 3 Total score (0-10 with 10 being normal): 10 CTA NECK FINDINGS SKELETON: There is no bony spinal canal stenosis. No lytic or blastic lesion. OTHER NECK: Subcentimeter right intraparotid lymph node. Normal left parotid gland. 8 mm right submandibular lymph node. Submandibular glands are normal. UPPER CHEST: Right upper lobe consolidation. AORTIC ARCH: There is no calcific atherosclerosis of the aortic arch. There is no aneurysm, dissection or hemodynamically significant stenosis of the visualized portion of the aorta. Conventional 3 vessel aortic branching pattern. The visualized proximal subclavian arteries are widely patent. RIGHT CAROTID SYSTEM: Normal without aneurysm, dissection or stenosis. LEFT CAROTID SYSTEM: Normal without aneurysm, dissection or stenosis. VERTEBRAL ARTERIES: Left dominant  configuration. Both origins are clearly patent. There is no dissection, occlusion or flow-limiting stenosis to the skull base (V1-V3 segments). CTA HEAD FINDINGS POSTERIOR CIRCULATION: --Vertebral arteries: Normal V4 segments. --Inferior cerebellar arteries: Normal. --Basilar artery: Normal. --Superior cerebellar arteries: Normal. --Posterior cerebral arteries (PCA): Normal. ANTERIOR CIRCULATION: --Intracranial internal carotid arteries: Normal. --Anterior cerebral arteries (ACA): Normal. Both A1 segments are present. Patent anterior communicating artery (a-comm). --Middle cerebral arteries (MCA): Normal. VENOUS SINUSES: As permitted by contrast timing, patent. ANATOMIC VARIANTS: None Review of the MIP images confirms the above findings. IMPRESSION: 1. No emergent large vessel occlusion or high-grade stenosis of the head or neck. 2. Right upper lobe consolidation, concerning for pneumonia. These results were called by telephone at the time of interpretation on 02/12/2021 at 9:14 pm to provider [facility] , who verbally acknowledged these results. Electronically Signed   By: Ulyses Jarred M.D.   On: 02/12/2021 21:14   CT ANGIO HEAD CODE STROKE  Result Date: 02/12/2021 CLINICAL DATA:  Facial droop and dysarthria EXAM: CT HEAD WITHOUT CONTRAST CT ANGIOGRAPHY OF THE HEAD AND NECK TECHNIQUE: Contiguous axial images were obtained from the base of the skull  through the vertex without intravenous contrast. Multidetector CT imaging of the head and neck was performed using the standard protocol during bolus administration of intravenous contrast. Multiplanar CT image reconstructions and MIPs were obtained to evaluate the vascular anatomy. Carotid stenosis measurements (when applicable) are obtained utilizing NASCET criteria, using the distal internal carotid diameter as the denominator. CONTRAST:  38mL OMNIPAQUE IOHEXOL 350 MG/ML SOLN COMPARISON:  None. FINDINGS: CT HEAD FINDINGS Brain: There is no mass,  hemorrhage or extra-axial collection. There is generalized atrophy without lobar predilection. There is hypoattenuation of the periventricular white matter, most commonly indicating chronic ischemic microangiopathy. Vascular: No abnormal hyperdensity of the major intracranial arteries or dural venous sinuses. No intracranial atherosclerosis. Skull: The visualized skull base, calvarium and extracranial soft tissues are normal. Sinuses/Orbits: No fluid levels or advanced mucosal thickening of the visualized paranasal sinuses. No mastoid or middle ear effusion. The orbits are normal. ASPECTS ([place] Stroke Program Early CT Score) - Ganglionic level infarction (caudate, lentiform nuclei, internal capsule, insula, M1-M3 cortex): 7 - Supraganglionic infarction (M4-M6 cortex): 3 Total score (0-10 with 10 being normal): 10 CTA NECK FINDINGS SKELETON: There is no bony spinal canal stenosis. No lytic or blastic lesion. OTHER NECK: Subcentimeter right intraparotid lymph node. Normal left parotid gland. 8 mm right submandibular lymph node. Submandibular glands are normal. UPPER CHEST: Right upper lobe consolidation. AORTIC ARCH: There is no calcific atherosclerosis of the aortic arch. There is no aneurysm, dissection or hemodynamically significant stenosis of the visualized portion of the aorta. Conventional 3 vessel aortic branching pattern. The visualized proximal subclavian arteries are widely patent. RIGHT CAROTID SYSTEM: Normal without aneurysm, dissection or stenosis. LEFT CAROTID SYSTEM: Normal without aneurysm, dissection or stenosis. VERTEBRAL ARTERIES: Left dominant configuration. Both origins are clearly patent. There is no dissection, occlusion or flow-limiting stenosis to the skull base (V1-V3 segments). CTA HEAD FINDINGS POSTERIOR CIRCULATION: --Vertebral arteries: Normal V4 segments. --Inferior cerebellar arteries: Normal. --Basilar artery: Normal. --Superior cerebellar arteries: Normal. --Posterior cerebral  arteries (PCA): Normal. ANTERIOR CIRCULATION: --Intracranial internal carotid arteries: Normal. --Anterior cerebral arteries (ACA): Normal. Both A1 segments are present. Patent anterior communicating artery (a-comm). --Middle cerebral arteries (MCA): Normal. VENOUS SINUSES: As permitted by contrast timing, patent. ANATOMIC VARIANTS: None Review of the MIP images confirms the above findings. IMPRESSION: 1. No emergent large vessel occlusion or high-grade stenosis of the head or neck. 2. Right upper lobe consolidation, concerning for pneumonia. These results were called by telephone at the time of interpretation on 02/12/2021 at 9:14 pm to provider [facility] , who verbally acknowledged these results. Electronically Signed   By: Ulyses Jarred M.D.   On: 02/12/2021 21:14   CT ANGIO NECK CODE STROKE  Result Date: 02/12/2021 CLINICAL DATA:  Facial droop and dysarthria EXAM: CT HEAD WITHOUT CONTRAST CT ANGIOGRAPHY OF THE HEAD AND NECK TECHNIQUE: Contiguous axial images were obtained from the base of the skull through the vertex without intravenous contrast. Multidetector CT imaging of the head and neck was performed using the standard protocol during bolus administration of intravenous contrast. Multiplanar CT image reconstructions and MIPs were obtained to evaluate the vascular anatomy. Carotid stenosis measurements (when applicable) are obtained utilizing NASCET criteria, using the distal internal carotid diameter as the denominator. CONTRAST:  12mL OMNIPAQUE IOHEXOL 350 MG/ML SOLN COMPARISON:  None. FINDINGS: CT HEAD FINDINGS Brain: There is no mass, hemorrhage or extra-axial collection. There is generalized atrophy without lobar predilection. There is hypoattenuation of the periventricular white matter, most commonly indicating chronic ischemic microangiopathy. Vascular: No abnormal  hyperdensity of the major intracranial arteries or dural venous sinuses. No intracranial atherosclerosis. Skull: The  visualized skull base, calvarium and extracranial soft tissues are normal. Sinuses/Orbits: No fluid levels or advanced mucosal thickening of the visualized paranasal sinuses. No mastoid or middle ear effusion. The orbits are normal. ASPECTS ([place] Stroke Program Early CT Score) - Ganglionic level infarction (caudate, lentiform nuclei, internal capsule, insula, M1-M3 cortex): 7 - Supraganglionic infarction (M4-M6 cortex): 3 Total score (0-10 with 10 being normal): 10 CTA NECK FINDINGS SKELETON: There is no bony spinal canal stenosis. No lytic or blastic lesion. OTHER NECK: Subcentimeter right intraparotid lymph node. Normal left parotid gland. 8 mm right submandibular lymph node. Submandibular glands are normal. UPPER CHEST: Right upper lobe consolidation. AORTIC ARCH: There is no calcific atherosclerosis of the aortic arch. There is no aneurysm, dissection or hemodynamically significant stenosis of the visualized portion of the aorta. Conventional 3 vessel aortic branching pattern. The visualized proximal subclavian arteries are widely patent. RIGHT CAROTID SYSTEM: Normal without aneurysm, dissection or stenosis. LEFT CAROTID SYSTEM: Normal without aneurysm, dissection or stenosis. VERTEBRAL ARTERIES: Left dominant configuration. Both origins are clearly patent. There is no dissection, occlusion or flow-limiting stenosis to the skull base (V1-V3 segments). CTA HEAD FINDINGS POSTERIOR CIRCULATION: --Vertebral arteries: Normal V4 segments. --Inferior cerebellar arteries: Normal. --Basilar artery: Normal. --Superior cerebellar arteries: Normal. --Posterior cerebral arteries (PCA): Normal. ANTERIOR CIRCULATION: --Intracranial internal carotid arteries: Normal. --Anterior cerebral arteries (ACA): Normal. Both A1 segments are present. Patent anterior communicating artery (a-comm). --Middle cerebral arteries (MCA): Normal. VENOUS SINUSES: As permitted by contrast timing, patent. ANATOMIC VARIANTS: None Review of the MIP  images confirms the above findings. IMPRESSION: 1. No emergent large vessel occlusion or high-grade stenosis of the head or neck. 2. Right upper lobe consolidation, concerning for pneumonia. These results were called by telephone at the time of interpretation on 02/12/2021 at 9:14 pm to provider [facility] , who verbally acknowledged these results. Electronically Signed   By: Ulyses Jarred M.D.   On: 02/12/2021 21:14

## 2021-02-14 LAB — GLUCOSE, CAPILLARY
Glucose-Capillary: 125 mg/dL — ABNORMAL HIGH (ref 70–99)
Glucose-Capillary: 121 mg/dL — ABNORMAL HIGH (ref 70–99)
Glucose-Capillary: 60 mg/dL — ABNORMAL LOW (ref 70–99)
Glucose-Capillary: 164 mg/dL — ABNORMAL HIGH (ref 70–99)
Glucose-Capillary: 166 mg/dL — ABNORMAL HIGH (ref 70–99)

## 2021-02-14 LAB — COMPREHENSIVE METABOLIC PANEL
ALT: 17 U/L (ref 0–44)
AST: 19 U/L (ref 15–41)
Albumin: 3 g/dL — ABNORMAL LOW (ref 3.5–5.0)
Alkaline Phosphatase: 60 U/L (ref 38–126)
Anion gap: 8 (ref 5–15)
BUN: 20 mg/dL (ref 8–23)
CO2: 20 mmol/L — ABNORMAL LOW (ref 22–32)
Calcium: 9.1 mg/dL (ref 8.9–10.3)
Chloride: 108 mmol/L (ref 98–111)
Creatinine, Ser: 1.64 mg/dL — ABNORMAL HIGH (ref 0.61–1.24)
GFR, Estimated: 39 mL/min — ABNORMAL LOW (ref >60)
Glucose, Bld: 144 mg/dL — ABNORMAL HIGH (ref 70–99)
Potassium: 3.7 mmol/L (ref 3.5–5.1)
Sodium: 136 mmol/L (ref 135–145)
Total Bilirubin: 0.8 mg/dL (ref 0.3–1.2)
Total Protein: 5.9 g/dL — ABNORMAL LOW (ref 6.5–8.1)

## 2021-02-14 LAB — CBC WITH DIFFERENTIAL/PLATELET
Abs Immature Granulocytes: 0.05 10*3/uL (ref 0.00–0.07)
Basophils Absolute: 0 10*3/uL (ref 0.0–0.1)
Basophils Relative: 0 %
Eosinophils Absolute: 0.2 10*3/uL (ref 0.0–0.5)
Eosinophils Relative: 2 %
HCT: 25.4 % — ABNORMAL LOW (ref 39.0–52.0)
Hemoglobin: 8.7 g/dL — ABNORMAL LOW (ref 13.0–17.0)
Immature Granulocytes: 1 %
Lymphocytes Relative: 15 %
Lymphs Abs: 1.4 10*3/uL (ref 0.7–4.0)
MCH: 33 pg (ref 26.0–34.0)
MCHC: 34.3 g/dL (ref 30.0–36.0)
MCV: 96.2 fL (ref 80.0–100.0)
Monocytes Absolute: 0.6 10*3/uL (ref 0.1–1.0)
Monocytes Relative: 7 %
Neutro Abs: 7.3 10*3/uL (ref 1.7–7.7)
Neutrophils Relative %: 75 %
Platelets: 142 10*3/uL — ABNORMAL LOW (ref 150–400)
RBC: 2.64 MIL/uL — ABNORMAL LOW (ref 4.22–5.81)
RDW: 14 % (ref 11.5–15.5)
WBC: 9.7 10*3/uL (ref 4.0–10.5)
nRBC: 0 % (ref 0.0–0.2)

## 2021-02-14 LAB — C-REACTIVE PROTEIN: CRP: 17.9 mg/dL — ABNORMAL HIGH (ref <1.0)

## 2021-02-14 LAB — PROCALCITONIN: Procalcitonin: 4.49 ng/mL

## 2021-02-14 LAB — BRAIN NATRIURETIC PEPTIDE: B Natriuretic Peptide: 375.9 pg/mL — ABNORMAL HIGH (ref 0.0–100.0)

## 2021-02-14 LAB — MAGNESIUM: Magnesium: 1.9 mg/dL (ref 1.7–2.4)

## 2021-02-14 MED ORDER — QUETIAPINE FUMARATE 25 MG PO TABS
12.5000 mg | ORAL_TABLET | Freq: Every day | ORAL | Status: DC
  Administered 2021-02-14 – 2021-02-18 (×5): 12.5 mg via ORAL
  Filled 2021-02-14 (×5): qty 1

## 2021-02-14 MED ORDER — LACTATED RINGERS IV SOLN: INTRAVENOUS | Status: AC

## 2021-02-14 NOTE — Progress Notes (Signed)
PROGRESS NOTE                                                                                                                                                                                                             Patient Demographics:    Tyler [name], is a 87 y.o. male, DOB - 1928-10-24, XAJ:[id number]  Outpatient Primary MD for the patient is [name], MD    LOS - 3  Admit date - 02/11/2021    Chief Complaint  Patient presents with   Weakness       Brief Narrative (HPI from H&P)   Tyler [name] is a 87 y.o. male with medical history significant for type 2 diabetes mellitus, essential hypertension, hyperlipidemia, stage IIIa chronic kidney disease with baseline creatinine reported 1.7, anemia of chronic disease with baseline hemoglobin 9-12, who presented from home to [facility] ED for evaluation of altered mental status, had Asp. PNA.   Subjective:   Patient in bed, appears comfortable, denies any headache, no fever, no chest pain or pressure, no shortness of breath , no abdominal pain. No new focal weakness.    Assessment  & Plan :      Acute Toxic encephalopathy due to aspiration pneumonia and now with delirium - had detailed discussion with patient's son and life, currently has been having problems swallowing food or liquids for several weeks, at this time will taper his medications down to IV Unasyn, gentle IV fluid, speech eval and treat and NPO.  Head CT & MRI are unremarkable and he has no focal neurological deficits.  He does not have sepsis.  Will monitor closely, minimize benzodiazepines and narcotics, nighttime Seroquel and as needed Haldol .  Encouraged the patient to sit up in chair in the daytime use I-S and flutter valve for pulmonary toiletry.  Will advance activity and titrate down oxygen as possible.   2.  Weakness and deconditioning due to #1 above.  PT OT and monitor.  3.  Insignificant mild  elevation in troponin patient ACS pattern.  Chest pain-free.  EKG is nonacute, continue statin and Plavix for secondary prevention.  No further work-up.  Mild troponin rise likely underlying infection.  4.  Essential hypertension.  Blood pressure is stable off medications for now only with as needed hydralazine.  5.  Dyslipidemia.  Statin.  6.  CKD 3B.  Baseline creatinine around 1.5.  Stable.  Monitor with gentle hydration.  7.  GERD.  Placed on Pepcid.  8.  AOCD.  Stable no acute issue.  9.  History of CVA in the past.  No new focal deficits, head CT stable, no headache, continue Plavix and statin for secondary prevention.  10. Hypomagnesemia.  Replaced.    11. DM type II.  Sliding scale.  This SmartLink has not been configured with any valid records.   CBG (last 3)  Recent Labs    02/13/21 1654 02/13/21 2220 02/14/21 0629  GLUCAP 181* 153* 125*         Condition -  Guarded  Family Communication  :    [name] [phone] 02/12/21 at 8.54 am message left  Called son Carliss [phone] + Wife 02/12/21 updated, son updated 02/14/20, 02/14/21   Code Status :  DNR  Consults  :  None  PUD Prophylaxis :    Procedures  :     MRI brain - Non acute  CT Head - 1. Stable chronic ischemic changes as above. No acute intracranial process  CT ABD  - Pelvis - No acute intra-abdominal pathology identified. No definite radiographic explanation for the patient's reported symptoms. Patchy infiltrate within the visualized right lung base, possibly infectious or inflammatory. Stable mild left nonobstructing nephrolithiasis. Indeterminate low-attenuation exophytic lesion arising from the upper pole of the right kidney, stable since prior examination of 01/22/2015. Its stability over time, however, favors a hyperdense renal cyst. Aortic Atherosclerosis (ICD10-I70.0).      Disposition Plan  :    Status is: Inpatient  Remains inpatient appropriate because:IV treatments  appropriate due to intensity of illness or inability to take PO  Dispo: The patient is from: Home              Anticipated d/c is to: SNF              Patient currently is not medically stable to d/c.   Difficult to place patient No  DVT Prophylaxis  :    enoxaparin (LOVENOX) injection 30 mg Start: 02/12/21 1600 SCDs Start: 02/11/21 2144  Lab Results  Component Value Date   PLT 142 (L) 02/14/2021    Diet :  Diet Order             Diet regular Room service appropriate? Yes with Assist; Fluid consistency: Thin  Diet effective now                    Inpatient Medications  Scheduled Meds:  atorvastatin  40 mg Oral Daily   clopidogrel  75 mg Oral Q breakfast   docusate sodium  100 mg Oral QHS   enoxaparin (LOVENOX) injection  30 mg Subcutaneous Q24H   famotidine  40 mg Oral Daily   insulin aspart  0-6 Units Subcutaneous TID WC   QUEtiapine  25 mg Oral QHS   vitamin B-12  1,000 mcg Oral QHS   Continuous Infusions:  ampicillin-sulbactam (UNASYN) IV 1.5 g (02/14/21 0354)   PRN Meds:.acetaminophen **OR** acetaminophen, hydrALAZINE  Antibiotics  :    Anti-infectives (From admission, onward)    Start     Dose/Rate Route Frequency Ordered Stop   02/13/21 1600  ampicillin-sulbactam (UNASYN) 1.5 g in sodium chloride 0.9 % 100 mL IVPB        1.5 g 200 mL/hr over 30 Minutes Intravenous Every 6 hours 02/13/21 1359     02/12/21 2100  vancomycin (VANCOREADY) IVPB 750 mg/150 mL  Status:  Discontinued        750 mg 150 mL/hr over 60 Minutes Intravenous Every 24 hours 02/11/21 2037 02/12/21 0902   02/12/21 0930  ampicillin-sulbactam (UNASYN) 1.5 g in sodium chloride 0.9 % 100 mL IVPB  Status:  Discontinued        1.5 g 200 mL/hr over 30 Minutes Intravenous Every 12 hours 02/12/21 0916 02/13/21 1359   02/12/21 0700  ceFEPIme (MAXIPIME) 2 g in sodium chloride 0.9 % 100 mL IVPB  Status:  Discontinued        2 g 200 mL/hr over 30 Minutes Intravenous Every 12 hours 02/11/21 2042  02/12/21 0902   02/12/21 0400  ceFEPIme (MAXIPIME) 2 g in sodium chloride 0.9 % 100 mL IVPB  Status:  Discontinued        2 g 200 mL/hr over 30 Minutes Intravenous Every 8 hours 02/11/21 2037 02/11/21 2042   02/11/21 1845  ceFEPIme (MAXIPIME) 2 g in sodium chloride 0.9 % 100 mL IVPB        2 g 200 mL/hr over 30 Minutes Intravenous  Once 02/11/21 1833 02/11/21 1934   02/11/21 1845  metroNIDAZOLE (FLAGYL) IVPB 500 mg        500 mg 100 mL/hr over 60 Minutes Intravenous  Once 02/11/21 1833 02/11/21 1950   02/11/21 1845  vancomycin (VANCOREADY) IVPB 1500 mg/300 mL        1,500 mg 150 mL/hr over 120 Minutes Intravenous  Once 02/11/21 1833 02/11/21 2320        Time Spent in minutes  30   [name] M.D on 02/14/2021 at 9:55 AM  To page go to [email or web address]   [facility] -  Office  [phone]  See all Orders from today for further details    Objective:   Vitals:   02/14/21 0146 02/14/21 0333 02/14/21 0704 02/14/21 0747  BP: (!) 165/58 (!) 179/69 (!) 153/59 (!) 161/70  Pulse: 78 91  88  Resp: 20 20 20 19   Temp: 97.8 F (36.6 C) (!) 97.4 F (36.3 C)  98.7 F (37.1 C)  TempSrc: Oral Oral  Oral  SpO2: 97% 97%  100%  Weight:  73.9 kg    Height:        Wt Readings from Last 3 Encounters:  02/14/21 73.9 kg  10/12/18 70.3 kg  07/11/18 74.4 kg     Intake/Output Summary (Last 24 hours) at 02/14/2021 0955 Last data filed at 02/14/2021 0900 Gross per 24 hour  Intake 716.02 ml  Output 1675 ml  Net -958.98 ml     Physical Exam  Awake but confused, No new F.N deficits,   [place].AT,[name], [name],   Symmetrical Chest wall movement, Good air movement bilaterally, CTAB RRR,No Gallops, Rubs or new Murmurs,  +ve B.Sounds, Abd Soft, No tenderness,   No Cyanosis, Clubbing or edema,        Data Review:    CBC Recent Labs  Lab 02/11/21 1830 02/12/21 0500 02/12/21 0508 02/13/21 0330 02/14/21 0309  WBC 15.5* 15.5*  --  9.9 9.7  HGB 10.3* 9.1*  9.2* 8.1* 8.7*  HCT 31.3* 27.3* 27.0* 23.4* 25.4*  PLT 152 139*  --  127* 142*  MCV 99.7 98.9  --  95.9 96.2  MCH 32.8 33.0  --  33.2 33.0  MCHC 32.9 33.3  --  34.6 34.3  RDW 14.0 13.8  --  13.8 14.0  LYMPHSABS 0.5* 1.4  --  0.8 1.4  MONOABS 0.8 0.9  --  0.6 0.6  EOSABS 0.0 0.0  --  0.0 0.2  BASOSABS 0.0 0.0  --  0.0 0.0    Recent Labs  Lab 02/11/21 1830 02/11/21 2019 02/12/21 0500 02/12/21 0508 02/12/21 0730 02/12/21 0911 02/13/21 0330 02/14/21 0309  NA 137  --  137 138  --   --  134* 136  K 4.2  --  4.1 4.1  --   --  3.7 3.7  CL 104  --  106  --   --   --  106 108  CO2 21*  --  21*  --   --   --  20* 20*  GLUCOSE 200*  --  127*  --   --   --  178* 144*  BUN 24*  --  25*  --   --   --  25* 20  CREATININE 1.58*  --  1.51*  --   --   --  1.60* 1.64*  CALCIUM 9.7  --  9.1  --   --   --  8.9 9.1  AST 36  --  20  --   --   --  23 19  ALT 27  --  23  --   --   --  18 17  ALKPHOS 79  --  58  --   --   --  55 60  BILITOT 1.6*  --  1.1  --   --   --  0.9 0.8  ALBUMIN 4.0  --  3.2*  --   --   --  2.9* 3.0*  MG  --   --  1.4*  --   --   --  1.9 1.9  CRP  --   --   --   --   --   --  18.7* 17.9*  PROCALCITON  --   --   --   --  7.52  --  6.51 4.49  LATICACIDVEN 1.8 1.3  --   --   --   --   --   --   INR  --   --  1.2  --   --   --   --   --   TSH  --   --   --   --  2.050  --   --   --   HGBA1C  --   --   --   --   --  6.9*  --   --   BNP  --   --   --   --   --   --  489.5* 375.9*    ------------------------------------------------------------------------------------------------------------------ No results for input(s): CHOL, HDL, LDLCALC, TRIG, CHOLHDL, LDLDIRECT in the last 72 hours.  Lab Results  Component Value Date   HGBA1C 6.9 (H) 02/12/2021   ------------------------------------------------------------------------------------------------------------------ Recent Labs    02/12/21 0730  TSH 2.050    Cardiac Enzymes No results for input(s): CKMB, TROPONINI,  MYOGLOBIN in the last 168 hours.  Invalid input(s): CK ------------------------------------------------------------------------------------------------------------------    Component Value Date/Time   BNP 375.9 (H) 02/14/2021 0309     Radiology Reports CT HEAD WO CONTRAST (5MM)  Result Date: 02/11/2021 CLINICAL DATA:  Weakness, fatigue EXAM: CT HEAD WITHOUT CONTRAST TECHNIQUE: Contiguous axial images were obtained from the base of the skull through the vertex without intravenous contrast. COMPARISON:  10/18/2018 FINDINGS: Brain: Chronic small vessel ischemic changes are again seen throughout the periventricular  white matter, stable. No signs of acute infarct or hemorrhage. Lateral ventricles and remaining midline structures are unremarkable. There are no acute extra-axial fluid collections. No mass effect. Vascular: No hyperdense vessel or unexpected calcification. Skull: Normal. Negative for fracture or focal lesion. Sinuses/Orbits: Mild mucosal thickening within the ethmoid air cells. Remaining paranasal sinuses are clear. Other: None. IMPRESSION: 1. Stable chronic ischemic changes as above. No acute intracranial process. Electronically Signed   By: Randa Ngo M.D.   On: 02/11/2021 20:44   MR BRAIN WO CONTRAST  Result Date: 02/12/2021 CLINICAL DATA:  Acute neurologic deficit EXAM: MRI HEAD WITHOUT CONTRAST TECHNIQUE: Multiplanar, multiecho pulse sequences of the brain and surrounding structures were obtained without intravenous contrast. COMPARISON:  None. FINDINGS: Brain: No acute infarct, mass effect or extra-axial collection. No acute or chronic hemorrhage. There is multifocal hyperintense T2-weighted signal within the white matter. Diffuse, severe atrophy. The midline structures are normal. Vascular: Major flow voids are preserved. Skull and upper cervical spine: Normal calvarium and skull base. Visualized upper cervical spine and soft tissues are normal. Sinuses/Orbits:No paranasal  sinus fluid levels or advanced mucosal thickening. No mastoid or middle ear effusion. Normal orbits. IMPRESSION: 1. No acute intracranial abnormality. 2. Diffuse, severe atrophy and findings of chronic microvascular ischemia. Electronically Signed   By: Ulyses Jarred M.D.   On: 02/12/2021 23:33   CT ABDOMEN PELVIS W CONTRAST  Result Date: 02/11/2021 CLINICAL DATA:  Nausea, vomiting, anorexia, oliguria. Abdominal pain and back pain. EXAM: CT ABDOMEN AND PELVIS WITH CONTRAST TECHNIQUE: Multidetector CT imaging of the abdomen and pelvis was performed using the standard protocol following bolus administration of intravenous contrast. CONTRAST:  71mL OMNIPAQUE IOHEXOL 350 MG/ML SOLN COMPARISON:  07/12/2020 FINDINGS: Lower chest: Patchy infiltrate is seen within the a posterior basal right lower lobe, possibly infectious or inflammatory in nature. Mild left basilar atelectasis. No pleural effusion. Cardiac size within normal limits. No pericardial effusion. Hepatobiliary: No focal liver abnormality is seen. Status post cholecystectomy. No biliary dilatation. Pancreas: Unremarkable Spleen: Unremarkable Adrenals/Urinary Tract: The adrenal glands are unremarkable. The kidneys are normal in size and position. Stable 7 mm nonobstructing calculus within the lower pole the left kidney. No ureteral calculi. No hydronephrosis. Multiple simple bilateral cortical cysts are again identified. There is a stable 20 mm partially exophytic low-attenuation lesion arising from the upper pole of the right kidney which may represent a hyperdense renal cyst or cystic renal mass, but is not well characterized on this single-phase examination. This appears stable, however, since remote prior examination of 01/22/2015 and its stability over time favors a benign lesion. The bladder is unremarkable. Stomach/Bowel: Stomach is within normal limits. Appendix appears normal. No evidence of bowel wall thickening, distention, or inflammatory  changes. Vascular/Lymphatic: Aortic atherosclerosis. No enlarged abdominal or pelvic lymph nodes. Reproductive: Prostatectomy and pelvic lymph node dissection has been performed. No recurrent mass lesion. Other: No abdominal wall hernia.  Rectum unremarkable. Musculoskeletal: Bilateral hip ORIF has been performed. Healed fractures the right superior and inferior pubic rami noted. Degenerative changes are seen within the lumbar spine. No acute bone abnormality. No lytic or blastic bone lesion. IMPRESSION: No acute intra-abdominal pathology identified. No definite radiographic explanation for the patient's reported symptoms. Patchy infiltrate within the visualized right lung base, possibly infectious or inflammatory. Stable mild left nonobstructing nephrolithiasis. Indeterminate low-attenuation exophytic lesion arising from the upper pole of the right kidney, stable since prior examination of 01/22/2015. Its stability over time, however, favors a hyperdense renal cyst. Aortic Atherosclerosis (ICD10-I70.0). Electronically  Signed   By: Fidela Salisbury M.D.   On: 02/11/2021 20:55   DG Chest Portable 1 View  Result Date: 02/11/2021 CLINICAL DATA:  Weakness, chest pain EXAM: PORTABLE CHEST 1 VIEW COMPARISON:  Chest x-ray 10/14/2018 FINDINGS: The heart and mediastinal contours are within normal limits. Vague right perihilar airspace density may be due to rotation. No pulmonary edema. No pleural effusion. No pneumothorax. No acute osseous abnormality. IMPRESSION: Low lung volumes with vague right perihilar airspace density may be due to rotation. Otherwise no acute cardiopulmonary abnormality. Electronically Signed   By: Iven Finn M.D.   On: 02/11/2021 19:15   CT HEAD CODE STROKE WO CONTRAST  Result Date: 02/12/2021 CLINICAL DATA:  Facial droop and dysarthria EXAM: CT HEAD WITHOUT CONTRAST CT ANGIOGRAPHY OF THE HEAD AND NECK TECHNIQUE: Contiguous axial images were obtained from the base of the skull through  the vertex without intravenous contrast. Multidetector CT imaging of the head and neck was performed using the standard protocol during bolus administration of intravenous contrast. Multiplanar CT image reconstructions and MIPs were obtained to evaluate the vascular anatomy. Carotid stenosis measurements (when applicable) are obtained utilizing NASCET criteria, using the distal internal carotid diameter as the denominator. CONTRAST:  44mL OMNIPAQUE IOHEXOL 350 MG/ML SOLN COMPARISON:  None. FINDINGS: CT HEAD FINDINGS Brain: There is no mass, hemorrhage or extra-axial collection. There is generalized atrophy without lobar predilection. There is hypoattenuation of the periventricular white matter, most commonly indicating chronic ischemic microangiopathy. Vascular: No abnormal hyperdensity of the major intracranial arteries or dural venous sinuses. No intracranial atherosclerosis. Skull: The visualized skull base, calvarium and extracranial soft tissues are normal. Sinuses/Orbits: No fluid levels or advanced mucosal thickening of the visualized paranasal sinuses. No mastoid or middle ear effusion. The orbits are normal. ASPECTS ([place] Stroke Program Early CT Score) - Ganglionic level infarction (caudate, lentiform nuclei, internal capsule, insula, M1-M3 cortex): 7 - Supraganglionic infarction (M4-M6 cortex): 3 Total score (0-10 with 10 being normal): 10 CTA NECK FINDINGS SKELETON: There is no bony spinal canal stenosis. No lytic or blastic lesion. OTHER NECK: Subcentimeter right intraparotid lymph node. Normal left parotid gland. 8 mm right submandibular lymph node. Submandibular glands are normal. UPPER CHEST: Right upper lobe consolidation. AORTIC ARCH: There is no calcific atherosclerosis of the aortic arch. There is no aneurysm, dissection or hemodynamically significant stenosis of the visualized portion of the aorta. Conventional 3 vessel aortic branching pattern. The visualized proximal subclavian arteries are  widely patent. RIGHT CAROTID SYSTEM: Normal without aneurysm, dissection or stenosis. LEFT CAROTID SYSTEM: Normal without aneurysm, dissection or stenosis. VERTEBRAL ARTERIES: Left dominant configuration. Both origins are clearly patent. There is no dissection, occlusion or flow-limiting stenosis to the skull base (V1-V3 segments). CTA HEAD FINDINGS POSTERIOR CIRCULATION: --Vertebral arteries: Normal V4 segments. --Inferior cerebellar arteries: Normal. --Basilar artery: Normal. --Superior cerebellar arteries: Normal. --Posterior cerebral arteries (PCA): Normal. ANTERIOR CIRCULATION: --Intracranial internal carotid arteries: Normal. --Anterior cerebral arteries (ACA): Normal. Both A1 segments are present. Patent anterior communicating artery (a-comm). --Middle cerebral arteries (MCA): Normal. VENOUS SINUSES: As permitted by contrast timing, patent. ANATOMIC VARIANTS: None Review of the MIP images confirms the above findings. IMPRESSION: 1. No emergent large vessel occlusion or high-grade stenosis of the head or neck. 2. Right upper lobe consolidation, concerning for pneumonia. These results were called by telephone at the time of interpretation on 02/12/2021 at 9:14 pm to provider [facility] , who verbally acknowledged these results. Electronically Signed   By: Ulyses Jarred M.D.   On: 02/12/2021  21:14   CT ANGIO HEAD CODE STROKE  Result Date: 02/12/2021 CLINICAL DATA:  Facial droop and dysarthria EXAM: CT HEAD WITHOUT CONTRAST CT ANGIOGRAPHY OF THE HEAD AND NECK TECHNIQUE: Contiguous axial images were obtained from the base of the skull through the vertex without intravenous contrast. Multidetector CT imaging of the head and neck was performed using the standard protocol during bolus administration of intravenous contrast. Multiplanar CT image reconstructions and MIPs were obtained to evaluate the vascular anatomy. Carotid stenosis measurements (when applicable) are obtained utilizing NASCET criteria,  using the distal internal carotid diameter as the denominator. CONTRAST:  5mL OMNIPAQUE IOHEXOL 350 MG/ML SOLN COMPARISON:  None. FINDINGS: CT HEAD FINDINGS Brain: There is no mass, hemorrhage or extra-axial collection. There is generalized atrophy without lobar predilection. There is hypoattenuation of the periventricular white matter, most commonly indicating chronic ischemic microangiopathy. Vascular: No abnormal hyperdensity of the major intracranial arteries or dural venous sinuses. No intracranial atherosclerosis. Skull: The visualized skull base, calvarium and extracranial soft tissues are normal. Sinuses/Orbits: No fluid levels or advanced mucosal thickening of the visualized paranasal sinuses. No mastoid or middle ear effusion. The orbits are normal. ASPECTS ([place] Stroke Program Early CT Score) - Ganglionic level infarction (caudate, lentiform nuclei, internal capsule, insula, M1-M3 cortex): 7 - Supraganglionic infarction (M4-M6 cortex): 3 Total score (0-10 with 10 being normal): 10 CTA NECK FINDINGS SKELETON: There is no bony spinal canal stenosis. No lytic or blastic lesion. OTHER NECK: Subcentimeter right intraparotid lymph node. Normal left parotid gland. 8 mm right submandibular lymph node. Submandibular glands are normal. UPPER CHEST: Right upper lobe consolidation. AORTIC ARCH: There is no calcific atherosclerosis of the aortic arch. There is no aneurysm, dissection or hemodynamically significant stenosis of the visualized portion of the aorta. Conventional 3 vessel aortic branching pattern. The visualized proximal subclavian arteries are widely patent. RIGHT CAROTID SYSTEM: Normal without aneurysm, dissection or stenosis. LEFT CAROTID SYSTEM: Normal without aneurysm, dissection or stenosis. VERTEBRAL ARTERIES: Left dominant configuration. Both origins are clearly patent. There is no dissection, occlusion or flow-limiting stenosis to the skull base (V1-V3 segments). CTA HEAD FINDINGS POSTERIOR  CIRCULATION: --Vertebral arteries: Normal V4 segments. --Inferior cerebellar arteries: Normal. --Basilar artery: Normal. --Superior cerebellar arteries: Normal. --Posterior cerebral arteries (PCA): Normal. ANTERIOR CIRCULATION: --Intracranial internal carotid arteries: Normal. --Anterior cerebral arteries (ACA): Normal. Both A1 segments are present. Patent anterior communicating artery (a-comm). --Middle cerebral arteries (MCA): Normal. VENOUS SINUSES: As permitted by contrast timing, patent. ANATOMIC VARIANTS: None Review of the MIP images confirms the above findings. IMPRESSION: 1. No emergent large vessel occlusion or high-grade stenosis of the head or neck. 2. Right upper lobe consolidation, concerning for pneumonia. These results were called by telephone at the time of interpretation on 02/12/2021 at 9:14 pm to provider [facility] , who verbally acknowledged these results. Electronically Signed   By: Ulyses Jarred M.D.   On: 02/12/2021 21:14   CT ANGIO NECK CODE STROKE  Result Date: 02/12/2021 CLINICAL DATA:  Facial droop and dysarthria EXAM: CT HEAD WITHOUT CONTRAST CT ANGIOGRAPHY OF THE HEAD AND NECK TECHNIQUE: Contiguous axial images were obtained from the base of the skull through the vertex without intravenous contrast. Multidetector CT imaging of the head and neck was performed using the standard protocol during bolus administration of intravenous contrast. Multiplanar CT image reconstructions and MIPs were obtained to evaluate the vascular anatomy. Carotid stenosis measurements (when applicable) are obtained utilizing NASCET criteria, using the distal internal carotid diameter as the denominator. CONTRAST:  16mL OMNIPAQUE IOHEXOL  350 MG/ML SOLN COMPARISON:  None. FINDINGS: CT HEAD FINDINGS Brain: There is no mass, hemorrhage or extra-axial collection. There is generalized atrophy without lobar predilection. There is hypoattenuation of the periventricular white matter, most commonly indicating  chronic ischemic microangiopathy. Vascular: No abnormal hyperdensity of the major intracranial arteries or dural venous sinuses. No intracranial atherosclerosis. Skull: The visualized skull base, calvarium and extracranial soft tissues are normal. Sinuses/Orbits: No fluid levels or advanced mucosal thickening of the visualized paranasal sinuses. No mastoid or middle ear effusion. The orbits are normal. ASPECTS ([place] Stroke Program Early CT Score) - Ganglionic level infarction (caudate, lentiform nuclei, internal capsule, insula, M1-M3 cortex): 7 - Supraganglionic infarction (M4-M6 cortex): 3 Total score (0-10 with 10 being normal): 10 CTA NECK FINDINGS SKELETON: There is no bony spinal canal stenosis. No lytic or blastic lesion. OTHER NECK: Subcentimeter right intraparotid lymph node. Normal left parotid gland. 8 mm right submandibular lymph node. Submandibular glands are normal. UPPER CHEST: Right upper lobe consolidation. AORTIC ARCH: There is no calcific atherosclerosis of the aortic arch. There is no aneurysm, dissection or hemodynamically significant stenosis of the visualized portion of the aorta. Conventional 3 vessel aortic branching pattern. The visualized proximal subclavian arteries are widely patent. RIGHT CAROTID SYSTEM: Normal without aneurysm, dissection or stenosis. LEFT CAROTID SYSTEM: Normal without aneurysm, dissection or stenosis. VERTEBRAL ARTERIES: Left dominant configuration. Both origins are clearly patent. There is no dissection, occlusion or flow-limiting stenosis to the skull base (V1-V3 segments). CTA HEAD FINDINGS POSTERIOR CIRCULATION: --Vertebral arteries: Normal V4 segments. --Inferior cerebellar arteries: Normal. --Basilar artery: Normal. --Superior cerebellar arteries: Normal. --Posterior cerebral arteries (PCA): Normal. ANTERIOR CIRCULATION: --Intracranial internal carotid arteries: Normal. --Anterior cerebral arteries (ACA): Normal. Both A1 segments are present. Patent anterior  communicating artery (a-comm). --Middle cerebral arteries (MCA): Normal. VENOUS SINUSES: As permitted by contrast timing, patent. ANATOMIC VARIANTS: None Review of the MIP images confirms the above findings. IMPRESSION: 1. No emergent large vessel occlusion or high-grade stenosis of the head or neck. 2. Right upper lobe consolidation, concerning for pneumonia. These results were called by telephone at the time of interpretation on 02/12/2021 at 9:14 pm to provider [facility] , who verbally acknowledged these results. Electronically Signed   By: Ulyses Jarred M.D.   On: 02/12/2021 21:14

## 2021-02-14 NOTE — NC FL2 (Signed)
[place] LEVEL OF CARE SCREENING TOOL     IDENTIFICATION  Patient Name: Tyler [name] Birthdate: 19-Jun-1928 Sex: male Admission Date (Current Location): 02/11/2021  [facility] and [place] Number:  Herbalist and Address:  [facility], [place] [address] [place], [place] [address]      Provider Number: [id number]  Attending Physician Name and Address:  Thurnell Lose, MD  Relative Name and Phone Number:       Current Level of Care: Hospital Recommended Level of Care: [place] Prior Approval Number:    Date Approved/Denied:   PASRR Number:    Discharge Plan: SNF    Current Diagnoses: Patient Active Problem List   Diagnosis Date Noted   Severe sepsis ([place]) 02/12/2021   Community acquired pneumonia 02/12/2021   Generalized weakness 02/12/2021   Elevated troponin 57/84/6962   Acute metabolic encephalopathy 95/28/4132   Fracture of femoral neck, left, closed ([place]) 10/12/2018   Hip pain 10/06/2018   Low back pain 10/06/2018   Closed nondisplaced fracture of pelvis ([place]) 08/22/2018   Labile blood pressure    Panic attack    Anxiety state    Labile blood glucose    Benign essential HTN    CKD (chronic kidney disease) stage 3, GFR 30-59 ml/min (HCC)    Dizziness and giddiness    Numbness    Acute blood loss anemia    AKI (acute kidney injury) ([place])    Diabetes mellitus type 2 in nonobese [facility])    Pelvic fracture ([place]) 06/15/2018   Pain in joint involving right pelvic region and thigh    Thrombocytopenia ([place])    Diabetes mellitus type 2 in obese ([place])    Anemia of chronic disease    Type II diabetes mellitus with renal manifestations ([place]) 06/12/2018   Fall 06/12/2018   Closed fracture of pubic ramus, right, initial encounter ([place]) 06/12/2018   Leukocytosis 06/12/2018   Closed fracture of right pubis, initial encounter ([place]) 06/12/2018   TIA (transient ischemic attack) 06/28/2016   CKD (chronic kidney  disease), stage III ([place]) 06/28/2016   Microscopic hematuria 06/28/2016   Femur fracture, right ([place]) 11/16/2015   History of nephrolithiasis 11/16/2015   Pseudobulbar affect 06/07/2014   Essential hypertension    Memory loss    Speech abnormality 01/28/2012   Cerebral embolism with cerebral infarction ([place]) 01/28/2012   History of stroke 01/28/2012   Type 2 diabetes mellitus without complication, without long-term current use of insulin ([place]) 01/28/2012   Hyperlipidemia 01/28/2012   History of prostate cancer 01/28/2012    Orientation RESPIRATION BLADDER Height & Weight     Self  Normal Incontinent Weight: 162 lb 14.7 oz (73.9 kg) Height:  5\' 10"  (177.8 cm)  BEHAVIORAL SYMPTOMS/MOOD NEUROLOGICAL BOWEL NUTRITION STATUS      Continent Diet (regular)  AMBULATORY STATUS COMMUNICATION OF NEEDS Skin   Limited Assist Verbally Normal                       Personal Care Assistance Level of Assistance  Bathing, Feeding, Dressing Bathing Assistance: Limited assistance Feeding assistance: Independent Dressing Assistance: Limited assistance     Functional Limitations Info  Sight, Hearing Sight Info: Impaired Hearing Info: Impaired      SPECIAL CARE FACTORS FREQUENCY  PT (By licensed PT), OT (By licensed OT)     PT Frequency: 5x/wk OT Frequency: 5x/wk  Contractures Contractures Info: Not present    Additional Factors Info  Code Status, Allergies, Psychotropic, Insulin Sliding Scale Code Status Info: DNR Allergies Info: Sulfa Drugs Cross Reactors, Metformin Hcl Psychotropic Info: Seroquel 12.5mg  daily at bed Insulin Sliding Scale Info: see DC summary       Current Medications (02/14/2021):  This is the current hospital active medication list Current Facility-Administered Medications  Medication Dose Route Frequency Provider Last Rate Last Admin   acetaminophen (TYLENOL) tablet 650 mg  650 mg Oral Q6H PRN Howerter, Justin B, DO   650 mg at 02/12/21 1106    Or   acetaminophen (TYLENOL) suppository 650 mg  650 mg Rectal Q6H PRN Howerter, Justin B, DO       ampicillin-sulbactam (UNASYN) 1.5 g in sodium chloride 0.9 % 100 mL IVPB  1.5 g Intravenous Q6H Rolla Flatten, RPH 200 mL/hr at 02/14/21 1042 1.5 g at 02/14/21 1042   atorvastatin (LIPITOR) tablet 40 mg  40 mg Oral Daily Howerter, Justin B, DO   40 mg at 02/14/21 1318   clopidogrel (PLAVIX) tablet 75 mg  75 mg Oral Q breakfast Howerter, Justin B, DO   75 mg at 02/14/21 1305   docusate sodium (COLACE) capsule 100 mg  100 mg Oral QHS [name] K, MD   100 mg at 02/13/21 2159   enoxaparin (LOVENOX) injection 30 mg  30 mg Subcutaneous Q24H [name] K, MD   30 mg at 02/13/21 1659   famotidine (PEPCID) tablet 40 mg  40 mg Oral Daily Thurnell Lose, MD   40 mg at 02/14/21 1319   hydrALAZINE (APRESOLINE) injection 10 mg  10 mg Intravenous Q6H PRN Thurnell Lose, MD   10 mg at 02/14/21 0848   insulin aspart (novoLOG) injection 0-6 Units  0-6 Units Subcutaneous TID WC Howerter, Justin B, DO   1 Units at 02/13/21 1700   lactated ringers infusion   Intravenous Continuous Thurnell Lose, MD 50 mL/hr at 02/14/21 1144 New Bag at 02/14/21 1144   QUEtiapine (SEROQUEL) tablet 12.5 mg  12.5 mg Oral QHS Thurnell Lose, MD       vitamin B-12 (CYANOCOBALAMIN) tablet 1,000 mcg  1,000 mcg Oral QHS Thurnell Lose, MD   1,000 mcg at 02/13/21 2159     Discharge Medications: Please see discharge summary for a list of discharge medications.  Relevant Imaging Results:  Relevant Lab Results:   Additional Information SS#: [id number]  Geralynn Ochs, LCSW

## 2021-02-14 NOTE — Progress Notes (Signed)
PT Cancellation Note  Patient Details Name: Tyler [name] MRN: [id number] DOB: Jul 01, 1928   Cancelled Treatment:    Reason Eval/Treat Not Completed: Fatigue/lethargy limiting ability to participate. Patient unable to be roused to participate with therapy. Family states he was given sedative last night due to agitation. Will re-attempt later today as able.    Gared Gillie 02/14/2021, 11:14 AM

## 2021-02-14 NOTE — Progress Notes (Signed)
Physical Therapy Treatment Patient Details Name: Tyler [name] MRN: [id number] DOB: Jul 24, 1928 Today's Date: 02/14/2021   History of Present Illness 87 y.o. male with medical history significant for type 2 diabetes mellitus, essential hypertension, hyperlipidemia, stage IIIa chronic kidney disease with baseline creatinine reported 1.7, anemia, who was admitted with acute metabolic encephalopathy.    PT Comments    Patient awake this pm when entered room. (Had been sleeping all day due to medications given last pm). Patient is very [place]. He is agreeable to try to get up to chair. He required mod assist for rolling and side lying to sit. Mod +2 for sit to stand and to pivot into recliner. Limited by fatigue, unable to progress this session.  Patient will continue to benefit from skilled PT while here to improve functional independence and strength for return home.     Recommendations for follow up therapy are one component of a multi-disciplinary discharge planning process, led by the attending physician.  Recommendations may be updated based on patient status, additional functional criteria and insurance authorization.  Follow Up Recommendations  Home health PT;Supervision/Assistance - 24 hour;Supervision for mobility/OOB     Equipment Recommendations  None recommended by PT    Recommendations for Other Services       Precautions / Restrictions Precautions Precautions: Fall Restrictions Weight Bearing Restrictions: No     Mobility  Bed Mobility Overal bed mobility: Needs Assistance Bed Mobility: Supine to Sit     Supine to sit: Mod assist          Transfers Overall transfer level: Needs assistance Equipment used: 2 person hand held assist Transfers: Sit to/from Stand;Stand Pivot Transfers Sit to Stand: Mod assist;+2 physical assistance Stand pivot transfers: Mod assist;+2 physical assistance          Ambulation/Gait             General Gait Details: did not  attempt due to continued lethargy this pm   Stairs             Wheelchair Mobility    Modified Rankin (Stroke Patients Only) Modified Rankin (Stroke Patients Only) Pre-Morbid Rankin Score: Moderate disability Modified Rankin: Moderately severe disability     Balance Overall balance assessment: Needs assistance Sitting-balance support: Feet supported Sitting balance-Leahy Scale: Fair     Standing balance support: Bilateral upper extremity supported;During functional activity Standing balance-Leahy Scale: Poor                              Cognition Arousal/Alertness: Lethargic;Awake/alert Behavior During Therapy: WFL for tasks assessed/performed Overall Cognitive Status: Within Functional Limits for tasks assessed                         Following Commands: Follows one step commands with increased time     Problem Solving: Slow processing;Decreased initiation;Requires verbal cues;Requires tactile cues;Difficulty sequencing General Comments: patient had been given meds last night for agitation. Was sleeping all day. Just woke when I saw him so he was still limited by lethargy.      Exercises      General Comments        Pertinent Vitals/Pain Pain Assessment: No/denies pain    Home Living                      Prior Function            PT Goals (  current goals can now be found in the care plan section) Acute Rehab PT Goals Patient Stated Goal: to go home PT Goal Formulation: With patient/family Time For Goal Achievement: 02/20/21 Potential to Achieve Goals: Fair Progress towards PT goals: Not progressing toward goals - comment (lethargy today due to meds)    Frequency    Min 3X/week      PT Plan Current plan remains appropriate    Co-evaluation              AM-PAC PT "6 Clicks" Mobility   Outcome Measure  Help needed turning from your back to your side while in a flat bed without using bedrails?: A  Lot Help needed moving from lying on your back to sitting on the side of a flat bed without using bedrails?: A Lot Help needed moving to and from a bed to a chair (including a wheelchair)?: A Lot Help needed standing up from a chair using your arms (e.g., wheelchair or bedside chair)?: A Lot Help needed to walk in hospital room?: A Lot Help needed climbing 3-5 steps with a railing? : A Lot 6 Click Score: 12    End of Session Equipment Utilized During Treatment: Gait belt Activity Tolerance: Patient limited by fatigue Patient left: in chair;with call bell/phone within reach;with nursing/sitter in room Nurse Communication: Mobility status PT Visit Diagnosis: Other abnormalities of gait and mobility (R26.89);Muscle weakness (generalized) (M62.81);Unsteadiness on feet (R26.81);Other symptoms and signs involving the nervous system (R29.898);Difficulty in walking, not elsewhere classified (R26.2)     Time: 3825-0539 PT Time Calculation (min) (ACUTE ONLY): 17 min  Charges:  $Therapeutic Activity: 8-22 mins                     [employer], PT, GCS 02/14/21,3:12 PM

## 2021-02-14 NOTE — TOC Initial Note (Signed)
Transition of Care [facility]) - Initial/Assessment Note    Patient Details  Name: Tyler [name] MRN: [id number] Date of Birth: 1928/08/04  [facility] CM/SW Contact:    Geralynn Ochs, LCSW Phone Number: 02/14/2021, 1:52 PM  Clinical Narrative:      CSW contacted by patient's spouse and son at bedside, as they are concerned about how patient is today and asking if he may need rehab at discharge. CSW discussed how therapy will continue to follow his progress and we'll see if they update recommendations, can look at SNF options in the interim. Family also agreeable to home health if patient improves. Patient will need to be able to ambulate at a supervision level, as the patient's spouse cannot assist. Prior to admit, patient was independent with mobility with a walker. If patient qualifies for SNF, family would be interested in [facility]. CSW completed referral and faxed out, will follow.             Expected Discharge Plan: [place] Barriers to Discharge: Continued Medical Work up, Ship broker   Patient Goals and CMS Choice Patient states their goals for this hospitalization and ongoing recovery are:: patient unable to participate in goal setting, only oriented to self CMS Medicare.gov Compare Post Acute Care list provided to:: Patient Represenative (must comment) Choice offered to / list presented to : Adult Children  Expected Discharge Plan and Services Expected Discharge Plan: [place] Acute Care Choice: NA Living arrangements for the past 2 months: Single Family Home                                      Prior Living Arrangements/Services Living arrangements for the past 2 months: Single Family Home Lives with:: Spouse Patient language and need for interpreter reviewed:: No Do you feel safe going back to the place where you live?: Yes      Need for Family Participation in Patient Care: Yes  (Comment) Care giver support system in place?: Yes (comment) Current home services: DME Criminal Activity/Legal Involvement Pertinent to Current Situation/Hospitalization: No - Comment as needed  Activities of Daily Living      Permission Sought/Granted Permission sought to share information with : Facility Sport and exercise psychologist, Family Supports Permission granted to share information with : Yes, Verbal Permission Granted  Share Information with NAME: [place], [place]  Permission granted to share info w AGENCY: SNF  Permission granted to share info w Relationship: Spouse, children     Emotional Assessment Appearance:: Appears stated age Attitude/Demeanor/Rapport: Unable to Assess Affect (typically observed): Unable to Assess Orientation: : Oriented to Self Alcohol / Substance Use: Not Applicable Psych Involvement: No (comment)  Admission diagnosis:  Acute metabolic encephalopathy [K09.38] Community acquired pneumonia of right lung, unspecified part of lung [J18.9] Patient Active Problem List   Diagnosis Date Noted   Severe sepsis ([place]) 02/12/2021   Community acquired pneumonia 02/12/2021   Generalized weakness 02/12/2021   Elevated troponin 18/29/9371   Acute metabolic encephalopathy 69/67/8938   Fracture of femoral neck, left, closed ([place]) 10/12/2018   Hip pain 10/06/2018   Low back pain 10/06/2018   Closed nondisplaced fracture of pelvis ([place]) 08/22/2018   Labile blood pressure    Panic attack    Anxiety state    Labile blood glucose    Benign essential HTN  CKD (chronic kidney disease) stage 3, GFR 30-59 ml/min (HCC)    Dizziness and giddiness    Numbness    Acute blood loss anemia    AKI (acute kidney injury) ([place])    Diabetes mellitus type 2 in nonobese [facility])    Pelvic fracture ([place]) 06/15/2018   Pain in joint involving right pelvic region and thigh    Thrombocytopenia ([place])    Diabetes mellitus type 2 in obese ([place])    Anemia of chronic disease     Type II diabetes mellitus with renal manifestations ([place]) 06/12/2018   Fall 06/12/2018   Closed fracture of pubic ramus, right, initial encounter ([place]) 06/12/2018   Leukocytosis 06/12/2018   Closed fracture of right pubis, initial encounter ([place]) 06/12/2018   TIA (transient ischemic attack) 06/28/2016   CKD (chronic kidney disease), stage III ([place]) 06/28/2016   Microscopic hematuria 06/28/2016   Femur fracture, right ([place]) 11/16/2015   History of nephrolithiasis 11/16/2015   Pseudobulbar affect 06/07/2014   Essential hypertension    Memory loss    Speech abnormality 01/28/2012   Cerebral embolism with cerebral infarction ([place]) 01/28/2012   History of stroke 01/28/2012   Type 2 diabetes mellitus without complication, without long-term current use of insulin ([place]) 01/28/2012   Hyperlipidemia 01/28/2012   History of prostate cancer 01/28/2012   PCP:  [name], MD Pharmacy:   [place], [place] [place] [address] Phone: [phone] Fax: [phone]     Social Determinants of Health (SDOH) Interventions    Readmission Risk Interventions No flowsheet data found.

## 2021-02-15 ENCOUNTER — Inpatient Hospital Stay (HOSPITAL_COMMUNITY): Payer: PPO

## 2021-02-15 LAB — COMPREHENSIVE METABOLIC PANEL
ALT: 18 U/L (ref 0–44)
AST: 19 U/L (ref 15–41)
Albumin: 2.7 g/dL — ABNORMAL LOW (ref 3.5–5.0)
Alkaline Phosphatase: 58 U/L (ref 38–126)
Anion gap: 9 (ref 5–15)
BUN: 24 mg/dL — ABNORMAL HIGH (ref 8–23)
CO2: 21 mmol/L — ABNORMAL LOW (ref 22–32)
Calcium: 8.9 mg/dL (ref 8.9–10.3)
Chloride: 107 mmol/L (ref 98–111)
Creatinine, Ser: 1.62 mg/dL — ABNORMAL HIGH (ref 0.61–1.24)
GFR, Estimated: 40 mL/min — ABNORMAL LOW (ref >60)
Glucose, Bld: 148 mg/dL — ABNORMAL HIGH (ref 70–99)
Potassium: 4 mmol/L (ref 3.5–5.1)
Sodium: 137 mmol/L (ref 135–145)
Total Bilirubin: 0.7 mg/dL (ref 0.3–1.2)
Total Protein: 5.5 g/dL — ABNORMAL LOW (ref 6.5–8.1)

## 2021-02-15 LAB — CBC WITH DIFFERENTIAL/PLATELET
Abs Immature Granulocytes: 0.07 10*3/uL (ref 0.00–0.07)
Basophils Absolute: 0 10*3/uL (ref 0.0–0.1)
Basophils Relative: 0 %
Eosinophils Absolute: 0.3 10*3/uL (ref 0.0–0.5)
Eosinophils Relative: 4 %
HCT: 24.3 % — ABNORMAL LOW (ref 39.0–52.0)
Hemoglobin: 8.3 g/dL — ABNORMAL LOW (ref 13.0–17.0)
Immature Granulocytes: 1 %
Lymphocytes Relative: 15 %
Lymphs Abs: 0.9 10*3/uL (ref 0.7–4.0)
MCH: 33.2 pg (ref 26.0–34.0)
MCHC: 34.2 g/dL (ref 30.0–36.0)
MCV: 97.2 fL (ref 80.0–100.0)
Monocytes Absolute: 0.5 10*3/uL (ref 0.1–1.0)
Monocytes Relative: 9 %
Neutro Abs: 4.5 10*3/uL (ref 1.7–7.7)
Neutrophils Relative %: 71 %
Platelets: 156 10*3/uL (ref 150–400)
RBC: 2.5 MIL/uL — ABNORMAL LOW (ref 4.22–5.81)
RDW: 13.9 % (ref 11.5–15.5)
WBC: 6.3 10*3/uL (ref 4.0–10.5)
nRBC: 0.3 % — ABNORMAL HIGH (ref 0.0–0.2)

## 2021-02-15 LAB — GLUCOSE, CAPILLARY
Glucose-Capillary: 163 mg/dL — ABNORMAL HIGH (ref 70–99)
Glucose-Capillary: 183 mg/dL — ABNORMAL HIGH (ref 70–99)
Glucose-Capillary: 151 mg/dL — ABNORMAL HIGH (ref 70–99)
Glucose-Capillary: 146 mg/dL — ABNORMAL HIGH (ref 70–99)

## 2021-02-15 LAB — METHYLMALONIC ACID, SERUM: Methylmalonic Acid, Quantitative: 265 nmol/L (ref 0–378)

## 2021-02-15 LAB — MAGNESIUM: Magnesium: 1.9 mg/dL (ref 1.7–2.4)

## 2021-02-15 LAB — BRAIN NATRIURETIC PEPTIDE: B Natriuretic Peptide: 165.9 pg/mL — ABNORMAL HIGH (ref 0.0–100.0)

## 2021-02-15 LAB — C-REACTIVE PROTEIN: CRP: 14 mg/dL — ABNORMAL HIGH (ref <1.0)

## 2021-02-15 LAB — PROCALCITONIN: Procalcitonin: 2.55 ng/mL

## 2021-02-15 MED ORDER — FAMOTIDINE 20 MG PO TABS
20.0000 mg | ORAL_TABLET | Freq: Every day | ORAL | Status: DC
  Administered 2021-02-16 – 2021-02-24 (×9): 20 mg via ORAL
  Filled 2021-02-15 (×7): qty 1
  Filled 2021-02-15: qty 2
  Filled 2021-02-15: qty 1

## 2021-02-15 MED ORDER — POLYETHYLENE GLYCOL 3350 17 G PO PACK
17.0000 g | PACK | Freq: Every day | ORAL | Status: DC | PRN
  Administered 2021-02-15: 17 g via ORAL
  Filled 2021-02-15: qty 1

## 2021-02-15 NOTE — Progress Notes (Signed)
PROGRESS NOTE                                                                                                                                                                                                             Patient Demographics:    Tyler [name], is a 87 y.o. male, DOB - 23-Sep-1928, EML:[id number]  Outpatient Primary MD for the patient is [name], MD    LOS - 4  Admit date - 02/11/2021    Chief Complaint  Patient presents with   Weakness       Brief Narrative (HPI from H&P)   Tyler [name] is a 87 y.o. male with medical history significant for type 2 diabetes mellitus, essential hypertension, hyperlipidemia, stage IIIa chronic kidney disease with baseline creatinine reported 1.7, anemia of chronic disease with baseline hemoglobin 9-12, who presented from home to [facility] ED for evaluation of altered mental status, had Asp. PNA.   Subjective:   Patient in bed, appears comfortable, denies any headache, no fever, no chest pain or pressure, no shortness of breath , no abdominal pain. No new focal weakness.    Assessment  & Plan :      Acute Toxic encephalopathy due to aspiration pneumonia and now with delirium - had detailed discussion with patient's son and life, currently has been having problems swallowing food or liquids for several weeks, at this time will taper his medications down to IV Unasyn, gentle IV fluid, speech eval and treat and NPO.  Head CT & MRI are unremarkable and he has no focal neurological deficits.  He does not have sepsis.  Will monitor closely, minimize benzodiazepines and narcotics, nighttime Seroquel and as needed Haldol .  Encouraged the patient to sit up in chair in the daytime use I-S and flutter valve for pulmonary toiletry.  Will advance activity and titrate down oxygen as possible.   2.  Weakness and deconditioning due to #1 above.  PT OT and monitor.  3.  Insignificant mild  elevation in troponin patient ACS pattern.  Chest pain-free.  EKG is nonacute, continue statin and Plavix for secondary prevention.  No further work-up.  Mild troponin rise likely underlying infection.  4.  Essential hypertension.  Blood pressure is stable off medications for now only with as needed hydralazine.  5.  Dyslipidemia.  Statin.  6.  CKD 3B.  Baseline creatinine around 1.5.  Stable.  Monitor with gentle hydration.  7.  GERD.  Placed on Pepcid.  8.  AOCD.  Stable no acute issue.  9.  History of CVA in the past.  No new focal deficits, head CT stable, no headache, continue Plavix and statin for secondary prevention.  10. Hypomagnesemia.  Replaced.    11. DM type II.  Sliding scale.  This SmartLink has not been configured with any valid records.   CBG (last 3)  Recent Labs    02/14/21 1634 02/14/21 2135 02/15/21 0607  GLUCAP 164* 166* 163*         Condition -  Guarded  Family Communication  :    [name] [phone] 02/12/21 at 8.54 am message left  Called son Arhaan [phone] + Wife 02/12/21 updated, son updated 02/14/20, 02/14/21, wife bedside 02/15/21   Code Status :  DNR  Consults  :  None  PUD Prophylaxis :    Procedures  :     MRI brain - Non acute  CT Head - 1. Stable chronic ischemic changes as above. No acute intracranial process  CT ABD  - Pelvis - No acute intra-abdominal pathology identified. No definite radiographic explanation for the patient's reported symptoms. Patchy infiltrate within the visualized right lung base, possibly infectious or inflammatory. Stable mild left nonobstructing nephrolithiasis. Indeterminate low-attenuation exophytic lesion arising from the upper pole of the right kidney, stable since prior examination of 01/22/2015. Its stability over time, however, favors a hyperdense renal cyst. Aortic Atherosclerosis (ICD10-I70.0).      Disposition Plan  :    Status is: Inpatient  Remains inpatient appropriate  because:IV treatments appropriate due to intensity of illness or inability to take PO  Dispo: The patient is from: Home              Anticipated d/c is to: SNF              Patient currently is not medically stable to d/c.   Difficult to place patient No  DVT Prophylaxis  :    enoxaparin (LOVENOX) injection 30 mg Start: 02/12/21 1600 SCDs Start: 02/11/21 2144  Lab Results  Component Value Date   PLT 156 02/15/2021    Diet :  Diet Order             Diet regular Room service appropriate? Yes with Assist; Fluid consistency: Thin  Diet effective now                    Inpatient Medications  Scheduled Meds:  atorvastatin  40 mg Oral Daily   clopidogrel  75 mg Oral Q breakfast   docusate sodium  100 mg Oral QHS   enoxaparin (LOVENOX) injection  30 mg Subcutaneous Q24H   [START ON 02/16/2021] famotidine  20 mg Oral Daily   insulin aspart  0-6 Units Subcutaneous TID WC   QUEtiapine  12.5 mg Oral QHS   vitamin B-12  1,000 mcg Oral QHS   Continuous Infusions:  ampicillin-sulbactam (UNASYN) IV 1.5 g (02/15/21 0944)   PRN Meds:.acetaminophen **OR** acetaminophen, hydrALAZINE, polyethylene glycol  Antibiotics  :    Anti-infectives (From admission, onward)    Start     Dose/Rate Route Frequency Ordered Stop   02/13/21 1600  ampicillin-sulbactam (UNASYN) 1.5 g in sodium chloride 0.9 % 100 mL IVPB        1.5 g 200 mL/hr over 30 Minutes Intravenous Every 6 hours 02/13/21 1359  02/12/21 2100  vancomycin (VANCOREADY) IVPB 750 mg/150 mL  Status:  Discontinued        750 mg 150 mL/hr over 60 Minutes Intravenous Every 24 hours 02/11/21 2037 02/12/21 0902   02/12/21 0930  ampicillin-sulbactam (UNASYN) 1.5 g in sodium chloride 0.9 % 100 mL IVPB  Status:  Discontinued        1.5 g 200 mL/hr over 30 Minutes Intravenous Every 12 hours 02/12/21 0916 02/13/21 1359   02/12/21 0700  ceFEPIme (MAXIPIME) 2 g in sodium chloride 0.9 % 100 mL IVPB  Status:  Discontinued        2 g 200  mL/hr over 30 Minutes Intravenous Every 12 hours 02/11/21 2042 02/12/21 0902   02/12/21 0400  ceFEPIme (MAXIPIME) 2 g in sodium chloride 0.9 % 100 mL IVPB  Status:  Discontinued        2 g 200 mL/hr over 30 Minutes Intravenous Every 8 hours 02/11/21 2037 02/11/21 2042   02/11/21 1845  ceFEPIme (MAXIPIME) 2 g in sodium chloride 0.9 % 100 mL IVPB        2 g 200 mL/hr over 30 Minutes Intravenous  Once 02/11/21 1833 02/11/21 1934   02/11/21 1845  metroNIDAZOLE (FLAGYL) IVPB 500 mg        500 mg 100 mL/hr over 60 Minutes Intravenous  Once 02/11/21 1833 02/11/21 1950   02/11/21 1845  vancomycin (VANCOREADY) IVPB 1500 mg/300 mL        1,500 mg 150 mL/hr over 120 Minutes Intravenous  Once 02/11/21 1833 02/11/21 2320        Time Spent in minutes  30   [name] M.D on 02/15/2021 at 11:08 AM  To page go to [email or web address]   [facility] -  Office  [phone]  See all Orders from today for further details    Objective:   Vitals:   02/15/21 0617 02/15/21 0618 02/15/21 0619 02/15/21 0817  BP:   (!) 153/70 (!) 144/57  Pulse:   95 90  Resp: (!) 23 (!) 25 20 19   Temp:    98.9 F (37.2 C)  TempSrc:    Axillary  SpO2:   96% 97%  Weight:      Height:        Wt Readings from Last 3 Encounters:  02/15/21 79.2 kg  10/12/18 70.3 kg  07/11/18 74.4 kg     Intake/Output Summary (Last 24 hours) at 02/15/2021 1108 Last data filed at 02/15/2021 0757 Gross per 24 hour  Intake 1136.45 ml  Output 1250 ml  Net -113.55 ml     Physical Exam  Awake - confused and emotional, No new F.N deficits,   [place].AT,[name], [name],   Symmetrical Chest wall movement, Good air movement bilaterally, CTAB RRR,No Gallops, Rubs or new Murmurs,  +ve B.Sounds, Abd Soft, No tenderness,   No Cyanosis, Clubbing or edema,         Data Review:    CBC Recent Labs  Lab 02/11/21 1830 02/12/21 0500 02/12/21 0508 02/13/21 0330 02/14/21 0309 02/15/21 0449  WBC 15.5* 15.5*  --   9.9 9.7 6.3  HGB 10.3* 9.1* 9.2* 8.1* 8.7* 8.3*  HCT 31.3* 27.3* 27.0* 23.4* 25.4* 24.3*  PLT 152 139*  --  127* 142* 156  MCV 99.7 98.9  --  95.9 96.2 97.2  MCH 32.8 33.0  --  33.2 33.0 33.2  MCHC 32.9 33.3  --  34.6 34.3 34.2  RDW 14.0 13.8  --  13.8 14.0  13.9  LYMPHSABS 0.5* 1.4  --  0.8 1.4 0.9  MONOABS 0.8 0.9  --  0.6 0.6 0.5  EOSABS 0.0 0.0  --  0.0 0.2 0.3  BASOSABS 0.0 0.0  --  0.0 0.0 0.0    Recent Labs  Lab 02/11/21 1830 02/11/21 2019 02/12/21 0500 02/12/21 0508 02/12/21 0730 02/12/21 0911 02/13/21 0330 02/14/21 0309 02/15/21 0449  NA 137  --  137 138  --   --  134* 136 137  K 4.2  --  4.1 4.1  --   --  3.7 3.7 4.0  CL 104  --  106  --   --   --  106 108 107  CO2 21*  --  21*  --   --   --  20* 20* 21*  GLUCOSE 200*  --  127*  --   --   --  178* 144* 148*  BUN 24*  --  25*  --   --   --  25* 20 24*  CREATININE 1.58*  --  1.51*  --   --   --  1.60* 1.64* 1.62*  CALCIUM 9.7  --  9.1  --   --   --  8.9 9.1 8.9  AST 36  --  20  --   --   --  23 19 19   ALT 27  --  23  --   --   --  18 17 18   ALKPHOS 79  --  58  --   --   --  55 60 58  BILITOT 1.6*  --  1.1  --   --   --  0.9 0.8 0.7  ALBUMIN 4.0  --  3.2*  --   --   --  2.9* 3.0* 2.7*  MG  --   --  1.4*  --   --   --  1.9 1.9 1.9  CRP  --   --   --   --   --   --  18.7* 17.9* 14.0*  PROCALCITON  --   --   --   --  7.52  --  6.51 4.49 2.55  LATICACIDVEN 1.8 1.3  --   --   --   --   --   --   --   INR  --   --  1.2  --   --   --   --   --   --   TSH  --   --   --   --  2.050  --   --   --   --   HGBA1C  --   --   --   --   --  6.9*  --   --   --   BNP  --   --   --   --   --   --  489.5* 375.9* 165.9*    ------------------------------------------------------------------------------------------------------------------ No results for input(s): CHOL, HDL, LDLCALC, TRIG, CHOLHDL, LDLDIRECT in the last 72 hours.  Lab Results  Component Value Date   HGBA1C 6.9 (H) 02/12/2021    ------------------------------------------------------------------------------------------------------------------ No results for input(s): TSH, T4TOTAL, T3FREE, THYROIDAB in the last 72 hours.  Invalid input(s): FREET3   Cardiac Enzymes No results for input(s): CKMB, TROPONINI, MYOGLOBIN in the last 168 hours.  Invalid input(s): CK ------------------------------------------------------------------------------------------------------------------    Component Value Date/Time   BNP 165.9 (H) 02/15/2021 0449     Radiology Reports CT HEAD WO CONTRAST (  5MM)  Result Date: 02/11/2021 CLINICAL DATA:  Weakness, fatigue EXAM: CT HEAD WITHOUT CONTRAST TECHNIQUE: Contiguous axial images were obtained from the base of the skull through the vertex without intravenous contrast. COMPARISON:  10/18/2018 FINDINGS: Brain: Chronic small vessel ischemic changes are again seen throughout the periventricular white matter, stable. No signs of acute infarct or hemorrhage. Lateral ventricles and remaining midline structures are unremarkable. There are no acute extra-axial fluid collections. No mass effect. Vascular: No hyperdense vessel or unexpected calcification. Skull: Normal. Negative for fracture or focal lesion. Sinuses/Orbits: Mild mucosal thickening within the ethmoid air cells. Remaining paranasal sinuses are clear. Other: None. IMPRESSION: 1. Stable chronic ischemic changes as above. No acute intracranial process. Electronically Signed   By: Randa Ngo M.D.   On: 02/11/2021 20:44   MR BRAIN WO CONTRAST  Result Date: 02/12/2021 CLINICAL DATA:  Acute neurologic deficit EXAM: MRI HEAD WITHOUT CONTRAST TECHNIQUE: Multiplanar, multiecho pulse sequences of the brain and surrounding structures were obtained without intravenous contrast. COMPARISON:  None. FINDINGS: Brain: No acute infarct, mass effect or extra-axial collection. No acute or chronic hemorrhage. There is multifocal hyperintense T2-weighted  signal within the white matter. Diffuse, severe atrophy. The midline structures are normal. Vascular: Major flow voids are preserved. Skull and upper cervical spine: Normal calvarium and skull base. Visualized upper cervical spine and soft tissues are normal. Sinuses/Orbits:No paranasal sinus fluid levels or advanced mucosal thickening. No mastoid or middle ear effusion. Normal orbits. IMPRESSION: 1. No acute intracranial abnormality. 2. Diffuse, severe atrophy and findings of chronic microvascular ischemia. Electronically Signed   By: Ulyses Jarred M.D.   On: 02/12/2021 23:33   CT ABDOMEN PELVIS W CONTRAST  Result Date: 02/11/2021 CLINICAL DATA:  Nausea, vomiting, anorexia, oliguria. Abdominal pain and back pain. EXAM: CT ABDOMEN AND PELVIS WITH CONTRAST TECHNIQUE: Multidetector CT imaging of the abdomen and pelvis was performed using the standard protocol following bolus administration of intravenous contrast. CONTRAST:  24mL OMNIPAQUE IOHEXOL 350 MG/ML SOLN COMPARISON:  07/12/2020 FINDINGS: Lower chest: Patchy infiltrate is seen within the a posterior basal right lower lobe, possibly infectious or inflammatory in nature. Mild left basilar atelectasis. No pleural effusion. Cardiac size within normal limits. No pericardial effusion. Hepatobiliary: No focal liver abnormality is seen. Status post cholecystectomy. No biliary dilatation. Pancreas: Unremarkable Spleen: Unremarkable Adrenals/Urinary Tract: The adrenal glands are unremarkable. The kidneys are normal in size and position. Stable 7 mm nonobstructing calculus within the lower pole the left kidney. No ureteral calculi. No hydronephrosis. Multiple simple bilateral cortical cysts are again identified. There is a stable 20 mm partially exophytic low-attenuation lesion arising from the upper pole of the right kidney which may represent a hyperdense renal cyst or cystic renal mass, but is not well characterized on this single-phase examination. This appears  stable, however, since remote prior examination of 01/22/2015 and its stability over time favors a benign lesion. The bladder is unremarkable. Stomach/Bowel: Stomach is within normal limits. Appendix appears normal. No evidence of bowel wall thickening, distention, or inflammatory changes. Vascular/Lymphatic: Aortic atherosclerosis. No enlarged abdominal or pelvic lymph nodes. Reproductive: Prostatectomy and pelvic lymph node dissection has been performed. No recurrent mass lesion. Other: No abdominal wall hernia.  Rectum unremarkable. Musculoskeletal: Bilateral hip ORIF has been performed. Healed fractures the right superior and inferior pubic rami noted. Degenerative changes are seen within the lumbar spine. No acute bone abnormality. No lytic or blastic bone lesion. IMPRESSION: No acute intra-abdominal pathology identified. No definite radiographic explanation for the patient's reported symptoms. Patchy  infiltrate within the visualized right lung base, possibly infectious or inflammatory. Stable mild left nonobstructing nephrolithiasis. Indeterminate low-attenuation exophytic lesion arising from the upper pole of the right kidney, stable since prior examination of 01/22/2015. Its stability over time, however, favors a hyperdense renal cyst. Aortic Atherosclerosis (ICD10-I70.0). Electronically Signed   By: Fidela Salisbury M.D.   On: 02/11/2021 20:55   DG Chest Port 1 View  Result Date: 02/15/2021 CLINICAL DATA:  Shortness of breath EXAM: PORTABLE CHEST 1 VIEW COMPARISON:  Four days ago FINDINGS: Right upper lobe airspace disease. There is no edema, effusion, or pneumothorax. Normal heart size and stable mediastinal contours. IMPRESSION: Unchanged right upper lobe pneumonia. Electronically Signed   By: Jorje Guild M.D.   On: 02/15/2021 08:09   DG Chest Portable 1 View  Result Date: 02/11/2021 CLINICAL DATA:  Weakness, chest pain EXAM: PORTABLE CHEST 1 VIEW COMPARISON:  Chest x-ray 10/14/2018 FINDINGS:  The heart and mediastinal contours are within normal limits. Vague right perihilar airspace density may be due to rotation. No pulmonary edema. No pleural effusion. No pneumothorax. No acute osseous abnormality. IMPRESSION: Low lung volumes with vague right perihilar airspace density may be due to rotation. Otherwise no acute cardiopulmonary abnormality. Electronically Signed   By: Iven Finn M.D.   On: 02/11/2021 19:15   CT HEAD CODE STROKE WO CONTRAST  Result Date: 02/12/2021 CLINICAL DATA:  Facial droop and dysarthria EXAM: CT HEAD WITHOUT CONTRAST CT ANGIOGRAPHY OF THE HEAD AND NECK TECHNIQUE: Contiguous axial images were obtained from the base of the skull through the vertex without intravenous contrast. Multidetector CT imaging of the head and neck was performed using the standard protocol during bolus administration of intravenous contrast. Multiplanar CT image reconstructions and MIPs were obtained to evaluate the vascular anatomy. Carotid stenosis measurements (when applicable) are obtained utilizing NASCET criteria, using the distal internal carotid diameter as the denominator. CONTRAST:  39mL OMNIPAQUE IOHEXOL 350 MG/ML SOLN COMPARISON:  None. FINDINGS: CT HEAD FINDINGS Brain: There is no mass, hemorrhage or extra-axial collection. There is generalized atrophy without lobar predilection. There is hypoattenuation of the periventricular white matter, most commonly indicating chronic ischemic microangiopathy. Vascular: No abnormal hyperdensity of the major intracranial arteries or dural venous sinuses. No intracranial atherosclerosis. Skull: The visualized skull base, calvarium and extracranial soft tissues are normal. Sinuses/Orbits: No fluid levels or advanced mucosal thickening of the visualized paranasal sinuses. No mastoid or middle ear effusion. The orbits are normal. ASPECTS ([place] Stroke Program Early CT Score) - Ganglionic level infarction (caudate, lentiform nuclei, internal capsule,  insula, M1-M3 cortex): 7 - Supraganglionic infarction (M4-M6 cortex): 3 Total score (0-10 with 10 being normal): 10 CTA NECK FINDINGS SKELETON: There is no bony spinal canal stenosis. No lytic or blastic lesion. OTHER NECK: Subcentimeter right intraparotid lymph node. Normal left parotid gland. 8 mm right submandibular lymph node. Submandibular glands are normal. UPPER CHEST: Right upper lobe consolidation. AORTIC ARCH: There is no calcific atherosclerosis of the aortic arch. There is no aneurysm, dissection or hemodynamically significant stenosis of the visualized portion of the aorta. Conventional 3 vessel aortic branching pattern. The visualized proximal subclavian arteries are widely patent. RIGHT CAROTID SYSTEM: Normal without aneurysm, dissection or stenosis. LEFT CAROTID SYSTEM: Normal without aneurysm, dissection or stenosis. VERTEBRAL ARTERIES: Left dominant configuration. Both origins are clearly patent. There is no dissection, occlusion or flow-limiting stenosis to the skull base (V1-V3 segments). CTA HEAD FINDINGS POSTERIOR CIRCULATION: --Vertebral arteries: Normal V4 segments. --Inferior cerebellar arteries: Normal. --Basilar artery: Normal. --Superior cerebellar  arteries: Normal. --Posterior cerebral arteries (PCA): Normal. ANTERIOR CIRCULATION: --Intracranial internal carotid arteries: Normal. --Anterior cerebral arteries (ACA): Normal. Both A1 segments are present. Patent anterior communicating artery (a-comm). --Middle cerebral arteries (MCA): Normal. VENOUS SINUSES: As permitted by contrast timing, patent. ANATOMIC VARIANTS: None Review of the MIP images confirms the above findings. IMPRESSION: 1. No emergent large vessel occlusion or high-grade stenosis of the head or neck. 2. Right upper lobe consolidation, concerning for pneumonia. These results were called by telephone at the time of interpretation on 02/12/2021 at 9:14 pm to provider [facility] , who verbally acknowledged these results.  Electronically Signed   By: Ulyses Jarred M.D.   On: 02/12/2021 21:14   CT ANGIO HEAD CODE STROKE  Result Date: 02/12/2021 CLINICAL DATA:  Facial droop and dysarthria EXAM: CT HEAD WITHOUT CONTRAST CT ANGIOGRAPHY OF THE HEAD AND NECK TECHNIQUE: Contiguous axial images were obtained from the base of the skull through the vertex without intravenous contrast. Multidetector CT imaging of the head and neck was performed using the standard protocol during bolus administration of intravenous contrast. Multiplanar CT image reconstructions and MIPs were obtained to evaluate the vascular anatomy. Carotid stenosis measurements (when applicable) are obtained utilizing NASCET criteria, using the distal internal carotid diameter as the denominator. CONTRAST:  5mL OMNIPAQUE IOHEXOL 350 MG/ML SOLN COMPARISON:  None. FINDINGS: CT HEAD FINDINGS Brain: There is no mass, hemorrhage or extra-axial collection. There is generalized atrophy without lobar predilection. There is hypoattenuation of the periventricular white matter, most commonly indicating chronic ischemic microangiopathy. Vascular: No abnormal hyperdensity of the major intracranial arteries or dural venous sinuses. No intracranial atherosclerosis. Skull: The visualized skull base, calvarium and extracranial soft tissues are normal. Sinuses/Orbits: No fluid levels or advanced mucosal thickening of the visualized paranasal sinuses. No mastoid or middle ear effusion. The orbits are normal. ASPECTS ([place] Stroke Program Early CT Score) - Ganglionic level infarction (caudate, lentiform nuclei, internal capsule, insula, M1-M3 cortex): 7 - Supraganglionic infarction (M4-M6 cortex): 3 Total score (0-10 with 10 being normal): 10 CTA NECK FINDINGS SKELETON: There is no bony spinal canal stenosis. No lytic or blastic lesion. OTHER NECK: Subcentimeter right intraparotid lymph node. Normal left parotid gland. 8 mm right submandibular lymph node. Submandibular glands are normal.  UPPER CHEST: Right upper lobe consolidation. AORTIC ARCH: There is no calcific atherosclerosis of the aortic arch. There is no aneurysm, dissection or hemodynamically significant stenosis of the visualized portion of the aorta. Conventional 3 vessel aortic branching pattern. The visualized proximal subclavian arteries are widely patent. RIGHT CAROTID SYSTEM: Normal without aneurysm, dissection or stenosis. LEFT CAROTID SYSTEM: Normal without aneurysm, dissection or stenosis. VERTEBRAL ARTERIES: Left dominant configuration. Both origins are clearly patent. There is no dissection, occlusion or flow-limiting stenosis to the skull base (V1-V3 segments). CTA HEAD FINDINGS POSTERIOR CIRCULATION: --Vertebral arteries: Normal V4 segments. --Inferior cerebellar arteries: Normal. --Basilar artery: Normal. --Superior cerebellar arteries: Normal. --Posterior cerebral arteries (PCA): Normal. ANTERIOR CIRCULATION: --Intracranial internal carotid arteries: Normal. --Anterior cerebral arteries (ACA): Normal. Both A1 segments are present. Patent anterior communicating artery (a-comm). --Middle cerebral arteries (MCA): Normal. VENOUS SINUSES: As permitted by contrast timing, patent. ANATOMIC VARIANTS: None Review of the MIP images confirms the above findings. IMPRESSION: 1. No emergent large vessel occlusion or high-grade stenosis of the head or neck. 2. Right upper lobe consolidation, concerning for pneumonia. These results were called by telephone at the time of interpretation on 02/12/2021 at 9:14 pm to provider [facility] , who verbally acknowledged these results. Electronically Signed  By: Ulyses Jarred M.D.   On: 02/12/2021 21:14   CT ANGIO NECK CODE STROKE  Result Date: 02/12/2021 CLINICAL DATA:  Facial droop and dysarthria EXAM: CT HEAD WITHOUT CONTRAST CT ANGIOGRAPHY OF THE HEAD AND NECK TECHNIQUE: Contiguous axial images were obtained from the base of the skull through the vertex without intravenous contrast.  Multidetector CT imaging of the head and neck was performed using the standard protocol during bolus administration of intravenous contrast. Multiplanar CT image reconstructions and MIPs were obtained to evaluate the vascular anatomy. Carotid stenosis measurements (when applicable) are obtained utilizing NASCET criteria, using the distal internal carotid diameter as the denominator. CONTRAST:  62mL OMNIPAQUE IOHEXOL 350 MG/ML SOLN COMPARISON:  None. FINDINGS: CT HEAD FINDINGS Brain: There is no mass, hemorrhage or extra-axial collection. There is generalized atrophy without lobar predilection. There is hypoattenuation of the periventricular white matter, most commonly indicating chronic ischemic microangiopathy. Vascular: No abnormal hyperdensity of the major intracranial arteries or dural venous sinuses. No intracranial atherosclerosis. Skull: The visualized skull base, calvarium and extracranial soft tissues are normal. Sinuses/Orbits: No fluid levels or advanced mucosal thickening of the visualized paranasal sinuses. No mastoid or middle ear effusion. The orbits are normal. ASPECTS ([place] Stroke Program Early CT Score) - Ganglionic level infarction (caudate, lentiform nuclei, internal capsule, insula, M1-M3 cortex): 7 - Supraganglionic infarction (M4-M6 cortex): 3 Total score (0-10 with 10 being normal): 10 CTA NECK FINDINGS SKELETON: There is no bony spinal canal stenosis. No lytic or blastic lesion. OTHER NECK: Subcentimeter right intraparotid lymph node. Normal left parotid gland. 8 mm right submandibular lymph node. Submandibular glands are normal. UPPER CHEST: Right upper lobe consolidation. AORTIC ARCH: There is no calcific atherosclerosis of the aortic arch. There is no aneurysm, dissection or hemodynamically significant stenosis of the visualized portion of the aorta. Conventional 3 vessel aortic branching pattern. The visualized proximal subclavian arteries are widely patent. RIGHT CAROTID SYSTEM:  Normal without aneurysm, dissection or stenosis. LEFT CAROTID SYSTEM: Normal without aneurysm, dissection or stenosis. VERTEBRAL ARTERIES: Left dominant configuration. Both origins are clearly patent. There is no dissection, occlusion or flow-limiting stenosis to the skull base (V1-V3 segments). CTA HEAD FINDINGS POSTERIOR CIRCULATION: --Vertebral arteries: Normal V4 segments. --Inferior cerebellar arteries: Normal. --Basilar artery: Normal. --Superior cerebellar arteries: Normal. --Posterior cerebral arteries (PCA): Normal. ANTERIOR CIRCULATION: --Intracranial internal carotid arteries: Normal. --Anterior cerebral arteries (ACA): Normal. Both A1 segments are present. Patent anterior communicating artery (a-comm). --Middle cerebral arteries (MCA): Normal. VENOUS SINUSES: As permitted by contrast timing, patent. ANATOMIC VARIANTS: None Review of the MIP images confirms the above findings. IMPRESSION: 1. No emergent large vessel occlusion or high-grade stenosis of the head or neck. 2. Right upper lobe consolidation, concerning for pneumonia. These results were called by telephone at the time of interpretation on 02/12/2021 at 9:14 pm to provider [facility] , who verbally acknowledged these results. Electronically Signed   By: Ulyses Jarred M.D.   On: 02/12/2021 21:14

## 2021-02-16 LAB — CBC WITH DIFFERENTIAL/PLATELET
Abs Immature Granulocytes: 0.07 10*3/uL (ref 0.00–0.07)
Basophils Absolute: 0 10*3/uL (ref 0.0–0.1)
Basophils Relative: 0 %
Eosinophils Absolute: 0.3 10*3/uL (ref 0.0–0.5)
Eosinophils Relative: 5 %
HCT: 25.7 % — ABNORMAL LOW (ref 39.0–52.0)
Hemoglobin: 8.5 g/dL — ABNORMAL LOW (ref 13.0–17.0)
Immature Granulocytes: 1 %
Lymphocytes Relative: 14 %
Lymphs Abs: 1 10*3/uL (ref 0.7–4.0)
MCH: 32.2 pg (ref 26.0–34.0)
MCHC: 33.1 g/dL (ref 30.0–36.0)
MCV: 97.3 fL (ref 80.0–100.0)
Monocytes Absolute: 0.6 10*3/uL (ref 0.1–1.0)
Monocytes Relative: 8 %
Neutro Abs: 5.2 10*3/uL (ref 1.7–7.7)
Neutrophils Relative %: 72 %
Platelets: 164 10*3/uL (ref 150–400)
RBC: 2.64 MIL/uL — ABNORMAL LOW (ref 4.22–5.81)
RDW: 13.8 % (ref 11.5–15.5)
WBC: 7.1 10*3/uL (ref 4.0–10.5)
nRBC: 0 % (ref 0.0–0.2)

## 2021-02-16 LAB — CULTURE, BLOOD (ROUTINE X 2)
Culture: NO GROWTH
Culture: NO GROWTH
Special Requests: ADEQUATE

## 2021-02-16 LAB — COMPREHENSIVE METABOLIC PANEL
ALT: 21 U/L (ref 0–44)
AST: 20 U/L (ref 15–41)
Albumin: 2.9 g/dL — ABNORMAL LOW (ref 3.5–5.0)
Alkaline Phosphatase: 60 U/L (ref 38–126)
Anion gap: 10 (ref 5–15)
BUN: 22 mg/dL (ref 8–23)
CO2: 21 mmol/L — ABNORMAL LOW (ref 22–32)
Calcium: 9 mg/dL (ref 8.9–10.3)
Chloride: 108 mmol/L (ref 98–111)
Creatinine, Ser: 1.57 mg/dL — ABNORMAL HIGH (ref 0.61–1.24)
GFR, Estimated: 42 mL/min — ABNORMAL LOW (ref >60)
Glucose, Bld: 143 mg/dL — ABNORMAL HIGH (ref 70–99)
Potassium: 3.7 mmol/L (ref 3.5–5.1)
Sodium: 139 mmol/L (ref 135–145)
Total Bilirubin: 1 mg/dL (ref 0.3–1.2)
Total Protein: 5.8 g/dL — ABNORMAL LOW (ref 6.5–8.1)

## 2021-02-16 LAB — MAGNESIUM: Magnesium: 2 mg/dL (ref 1.7–2.4)

## 2021-02-16 LAB — BRAIN NATRIURETIC PEPTIDE: B Natriuretic Peptide: 247.5 pg/mL — ABNORMAL HIGH (ref 0.0–100.0)

## 2021-02-16 LAB — C-REACTIVE PROTEIN: CRP: 10.4 mg/dL — ABNORMAL HIGH (ref <1.0)

## 2021-02-16 LAB — GLUCOSE, CAPILLARY
Glucose-Capillary: 122 mg/dL — ABNORMAL HIGH (ref 70–99)
Glucose-Capillary: 237 mg/dL — ABNORMAL HIGH (ref 70–99)
Glucose-Capillary: 200 mg/dL — ABNORMAL HIGH (ref 70–99)
Glucose-Capillary: 193 mg/dL — ABNORMAL HIGH (ref 70–99)

## 2021-02-16 LAB — PROCALCITONIN: Procalcitonin: 1.42 ng/mL

## 2021-02-16 MED ORDER — AMLODIPINE BESYLATE 10 MG PO TABS
10.0000 mg | ORAL_TABLET | Freq: Every day | ORAL | Status: DC
  Administered 2021-02-16 – 2021-02-24 (×9): 10 mg via ORAL
  Filled 2021-02-16 (×9): qty 1

## 2021-02-16 NOTE — Progress Notes (Signed)
Occupational Therapy Treatment Patient Details Name: Tyler [name] MRN: [id number] DOB: 09-13-1928 Today's Date: 02/16/2021   History of present illness 87 y.o. male with medical history significant for type 2 diabetes mellitus, essential hypertension, hyperlipidemia, stage IIIa chronic kidney disease with baseline creatinine reported 1.7, anemia, who was admitted with acute metabolic encephalopathy.   OT comments  Nursing states that patient had been lethargic and was unable to arousal to eat lunch. Patient was awake when therapist arrived and was willing to sit on eob but did not want to attempt transfer into recliner. Once on eob therapist attempted to request sitting in recliner to assist with self feeding but patient declined and ate less than 25% of meal and performed light grooming on EOB before asking to return to supine. Acute OT to continue to follow.    Recommendations for follow up therapy are one component of a multi-disciplinary discharge planning process, led by the attending physician.  Recommendations may be updated based on patient status, additional functional criteria and insurance authorization.    Follow Up Recommendations  Home health OT    Equipment Recommendations  None recommended by OT    Recommendations for Other Services      Precautions / Restrictions Precautions Precautions: Fall       Mobility Bed Mobility Overal bed mobility: Needs Assistance Bed Mobility: Supine to Sit;Sit to Supine     Supine to sit: Mod assist Sit to supine: Min assist   General bed mobility comments: required verbal cues for technique and rail use with physical assistance for BLEs.    Transfers                 General transfer comment: patient did not want to attempt transfer into recliner    Balance Overall balance assessment: Needs assistance Sitting-balance support: Feet supported Sitting balance-Leahy Scale: Fair Sitting balance - Comments: able to eat  lunch and perform light grooming sitting on eob                                   ADL either performed or assessed with clinical judgement   ADL Overall ADL's : Needs assistance/impaired Eating/Feeding: Set up;Sitting Eating/Feeding Details (indicate cue type and reason): performed sitting on eob Grooming: Wash/dry face;Sitting;Set up Grooming Details (indicate cue type and reason): performed sittingon eob                               General ADL Comments: patient was only agreeable to sit on eob     Vision       Perception     Praxis      Cognition Arousal/Alertness: Awake/alert Behavior During Therapy: WFL for tasks assessed/performed Overall Cognitive Status: Within Functional Limits for tasks assessed Area of Impairment: Attention;Memory;Following commands;Problem solving;Safety/judgement                   Current Attention Level: Sustained Memory: Decreased short-term memory Following Commands: Follows one step commands with increased time Safety/Judgement: Decreased awareness of deficits;Decreased awareness of safety   Problem Solving: Slow processing;Decreased initiation;Requires verbal cues;Requires tactile cues;Difficulty sequencing General Comments: Patient was awake and alert upon entry but quickly fatigued sitting on eob        Exercises     Shoulder Instructions       General Comments      Pertinent Vitals/ Pain  Pain Assessment: No/denies pain  Home Living                                          Prior Functioning/Environment              Frequency  Min 2X/week        Progress Toward Goals  OT Goals(current goals can now be found in the care plan section)  Progress towards OT goals: Progressing toward goals  Acute Rehab OT Goals Patient Stated Goal: to go home OT Goal Formulation: With patient Time For Goal Achievement: 02/26/21 Potential to Achieve Goals: Fair ADL  Goals Pt Will Perform Grooming: with supervision;sitting Pt Will Perform Upper Body Bathing: with supervision;sitting Pt Will Perform Upper Body Dressing: with supervision;sitting Pt Will Transfer to Toilet: with min assist;ambulating;regular height toilet Pt Will Perform Toileting - Clothing Manipulation and hygiene: with min guard assist;sit to/from stand  Plan Discharge plan remains appropriate    Co-evaluation                 AM-PAC OT "6 Clicks" Daily Activity     Outcome Measure   Help from another person eating meals?: None Help from another person taking care of personal grooming?: None Help from another person toileting, which includes using toliet, bedpan, or urinal?: A Lot Help from another person bathing (including washing, rinsing, drying)?: A Lot Help from another person to put on and taking off regular upper body clothing?: A Little Help from another person to put on and taking off regular lower body clothing?: A Lot 6 Click Score: 17    End of Session    OT Visit Diagnosis: Other symptoms and signs involving cognitive function;Muscle weakness (generalized) (M62.81)   Activity Tolerance Patient limited by fatigue   Patient Left in bed;with call bell/phone within reach;with bed alarm set;Other (comment) (mats placed on floor)   Nurse Communication Other (comment) (discussed patient's level of participation)        Time: 5852-7782 OT Time Calculation (min): 21 min  Charges: OT General Charges $OT Visit: 1 Visit OT Treatments $Self Care/Home Management : 8-22 mins  Lodema Hong, [place]  Pager [phone] Office [place] 02/16/2021, 2:13 PM

## 2021-02-16 NOTE — Progress Notes (Signed)
Pharmacy Antibiotic Note  Tyler [name] is a 87 y.o. male admitted on 02/11/2021 with sepsis. Patient was originally treated with vanc/cefepime, but has been on Unasyn since 10/12. Patient is currently afebrile, WBC WNL, and renal function stable. Pharmacy has been consulted for Unasyn dosing. Patient is currently on day 6 of antibiotic therapy.   Plan: - CONTINUE Unasyn 1.5g IV Q6H  - F/U renal function (borderline for dose adjustment), LOT   Height: 5\' 10"  (177.8 cm) Weight: 79.2 kg (174 lb 9.7 oz) IBW/kg (Calculated) : 73  Temp (24hrs), Avg:98.2 F (36.8 C), Min:97.7 F (36.5 C), Max:98.9 F (37.2 C)  Recent Labs  Lab 02/11/21 1830 02/11/21 2019 02/12/21 0500 02/13/21 0330 02/14/21 0309 02/15/21 0449 02/16/21 0354  WBC 15.5*  --  15.5* 9.9 9.7 6.3 7.1  CREATININE 1.58*  --  1.51* 1.60* 1.64* 1.62* 1.57*  LATICACIDVEN 1.8 1.3  --   --   --   --   --      Estimated Creatinine Clearance: 32.3 mL/min (A) (by C-G formula based on SCr of 1.57 mg/dL (H)).    Allergies  Allergen Reactions   Sulfa Drugs Cross Reactors Anaphylaxis and Rash    Lungs fill with fluid   Metformin Hcl Nausea Only    Antimicrobials this admission: Vancomycin 10/11>>12 Cefepime 10/11>>12 Flagyl 10/11>>12 Unasyn 10/12>>  Microbiology results: 10/11 BCx: negative  10/11 UCx: <10,000 colonies  Thank you for allowing pharmacy to be a part of this patient's care.  Adria Dill, PharmD PGY-1 Acute Care Resident  02/16/2021 9:00 AM

## 2021-02-16 NOTE — Progress Notes (Signed)
PROGRESS NOTE                                                                                                                                                                                                             Patient Demographics:    Tyler [name], is a 87 y.o. male, DOB - 06-26-1928, YJW:[id number]  Outpatient Primary MD for the patient is [name], MD    LOS - 5  Admit date - 02/11/2021    Chief Complaint  Patient presents with   Weakness       Brief Narrative (HPI from H&P)   Tyler [name] is a 87 y.o. male with medical history significant for type 2 diabetes mellitus, essential hypertension, hyperlipidemia, stage IIIa chronic kidney disease with baseline creatinine reported 1.7, anemia of chronic disease with baseline hemoglobin 9-12, who presented from home to [facility] ED for evaluation of altered mental status, had Asp. PNA.   Subjective:   Patient in bed, appears comfortable, denies any headache, no fever, no chest pain or pressure, no shortness of breath , no abdominal pain. No new focal weakness.   Assessment  & Plan :      Acute Toxic encephalopathy due to aspiration pneumonia and now with delirium - had detailed discussion with patient's son and life, currently has been having problems swallowing food or liquids for several weeks, at this time will taper his medications down to IV Unasyn, gentle IV fluid, speech eval and treat and NPO.  Head CT & MRI are unremarkable and he has no focal neurological deficits.  He does not have sepsis.  Will monitor closely, minimized benzodiazepines and narcotics, nighttime Seroquel and as needed Haldol, much better, likely DC 02/17/21 HHPT vs SNF.  Encouraged the patient to sit up in chair in the daytime use I-S and flutter valve for pulmonary toiletry.  Will advance activity and titrate down oxygen as possible.   2.  Weakness and deconditioning due to #1 above.  PT OT  and monitor.  3.  Insignificant mild elevation in troponin patient ACS pattern.  Chest pain-free.  EKG is nonacute, continue statin and Plavix for secondary prevention.  No further work-up.  Mild troponin rise likely underlying infection.  4.  Essential hypertension.  Blood pressure is stable off medications for now only with as needed hydralazine.  5.  Dyslipidemia.  Statin.  6.  CKD 3B.  Baseline creatinine around 1.5.  Stable.  Monitor with gentle hydration.  7.  GERD.  Placed on Pepcid.  8.  AOCD.  Stable no acute issue.  9.  History of CVA in the past.  No new focal deficits, head CT stable, no headache, continue Plavix and statin for secondary prevention.  10. Hypomagnesemia.  Replaced.    11. DM type II.  Sliding scale.  This SmartLink has not been configured with any valid records.   CBG (last 3)  Recent Labs    02/15/21 1644 02/15/21 2112 02/16/21 0621  GLUCAP 151* 146* 122*         Condition -  Guarded  Family Communication  :    [name] [phone] 02/12/21 at 8.54 am message left  Called son Gearold [phone] Wife 02/12/21 updated, son updated 02/14/20, 02/14/21, wife bedside 02/15/21, message left for Jaece 02/16/21 at 9.31   Code Status :  DNR  Consults  :  None  PUD Prophylaxis :    Procedures  :     MRI brain - Non acute  CT Head - 1. Stable chronic ischemic changes as above. No acute intracranial process  CT ABD  - Pelvis - No acute intra-abdominal pathology identified. No definite radiographic explanation for the patient's reported symptoms. Patchy infiltrate within the visualized right lung base, possibly infectious or inflammatory. Stable mild left nonobstructing nephrolithiasis. Indeterminate low-attenuation exophytic lesion arising from the upper pole of the right kidney, stable since prior examination of 01/22/2015. Its stability over time, however, favors a hyperdense renal cyst. Aortic Atherosclerosis (ICD10-I70.0).       Disposition Plan  :    Status is: Inpatient  Remains inpatient appropriate because:IV treatments appropriate due to intensity of illness or inability to take PO  Dispo: The patient is from: Home              Anticipated d/c is to: SNF              Patient currently is not medically stable to d/c.   Difficult to place patient No  DVT Prophylaxis  :    enoxaparin (LOVENOX) injection 30 mg Start: 02/12/21 1600 SCDs Start: 02/11/21 2144  Lab Results  Component Value Date   PLT 164 02/16/2021    Diet :  Diet Order             Diet regular Room service appropriate? Yes with Assist; Fluid consistency: Thin  Diet effective now                    Inpatient Medications  Scheduled Meds:  amLODipine  10 mg Oral Daily   atorvastatin  40 mg Oral Daily   clopidogrel  75 mg Oral Q breakfast   docusate sodium  100 mg Oral QHS   enoxaparin (LOVENOX) injection  30 mg Subcutaneous Q24H   famotidine  20 mg Oral Daily   insulin aspart  0-6 Units Subcutaneous TID WC   QUEtiapine  12.5 mg Oral QHS   vitamin B-12  1,000 mcg Oral QHS   Continuous Infusions:  ampicillin-sulbactam (UNASYN) IV 1.5 g (02/16/21 0854)   PRN Meds:.acetaminophen **OR** acetaminophen, hydrALAZINE, polyethylene glycol  Antibiotics  :    Anti-infectives (From admission, onward)    Start     Dose/Rate Route Frequency Ordered Stop   02/13/21 1600  ampicillin-sulbactam (UNASYN) 1.5 g in sodium chloride 0.9 % 100 mL IVPB  1.5 g 200 mL/hr over 30 Minutes Intravenous Every 6 hours 02/13/21 1359     02/12/21 2100  vancomycin (VANCOREADY) IVPB 750 mg/150 mL  Status:  Discontinued        750 mg 150 mL/hr over 60 Minutes Intravenous Every 24 hours 02/11/21 2037 02/12/21 0902   02/12/21 0930  ampicillin-sulbactam (UNASYN) 1.5 g in sodium chloride 0.9 % 100 mL IVPB  Status:  Discontinued        1.5 g 200 mL/hr over 30 Minutes Intravenous Every 12 hours 02/12/21 0916 02/13/21 1359   02/12/21 0700  ceFEPIme  (MAXIPIME) 2 g in sodium chloride 0.9 % 100 mL IVPB  Status:  Discontinued        2 g 200 mL/hr over 30 Minutes Intravenous Every 12 hours 02/11/21 2042 02/12/21 0902   02/12/21 0400  ceFEPIme (MAXIPIME) 2 g in sodium chloride 0.9 % 100 mL IVPB  Status:  Discontinued        2 g 200 mL/hr over 30 Minutes Intravenous Every 8 hours 02/11/21 2037 02/11/21 2042   02/11/21 1845  ceFEPIme (MAXIPIME) 2 g in sodium chloride 0.9 % 100 mL IVPB        2 g 200 mL/hr over 30 Minutes Intravenous  Once 02/11/21 1833 02/11/21 1934   02/11/21 1845  metroNIDAZOLE (FLAGYL) IVPB 500 mg        500 mg 100 mL/hr over 60 Minutes Intravenous  Once 02/11/21 1833 02/11/21 1950   02/11/21 1845  vancomycin (VANCOREADY) IVPB 1500 mg/300 mL        1,500 mg 150 mL/hr over 120 Minutes Intravenous  Once 02/11/21 1833 02/11/21 2320        Time Spent in minutes  30   [name] M.D on 02/16/2021 at 9:30 AM  To page go to [email or web address]   [facility] -  Office  [phone]  See all Orders from today for further details    Objective:   Vitals:   02/15/21 2009 02/16/21 0021 02/16/21 0452 02/16/21 0914  BP: (!) 162/64 (!) 154/69 (!) 162/76 (!) 159/73  Pulse: 78 76 89 88  Resp: 19 18 18 15   Temp: 98.1 F (36.7 C) 98.9 F (37.2 C) 98 F (36.7 C) 98.2 F (36.8 C)  TempSrc: Oral Oral Oral Oral  SpO2: 95% 95% 99% 97%  Weight:      Height:        Wt Readings from Last 3 Encounters:  02/15/21 79.2 kg  10/12/18 70.3 kg  07/11/18 74.4 kg     Intake/Output Summary (Last 24 hours) at 02/16/2021 0930 Last data filed at 02/15/2021 2136 Gross per 24 hour  Intake 340 ml  Output 301 ml  Net 39 ml     Physical Exam  Awake mildly confused, No new F.N deficits,   [place].AT,[name], [name],   Symmetrical Chest wall movement, Good air movement bilaterally, CTAB RRR,No Gallops, Rubs or new Murmurs,  +ve B.Sounds, Abd Soft, No tenderness,   No Cyanosis, Clubbing or edema,           Data Review:    CBC Recent Labs  Lab 02/12/21 0500 02/12/21 0508 02/13/21 0330 02/14/21 0309 02/15/21 0449 02/16/21 0354  WBC 15.5*  --  9.9 9.7 6.3 7.1  HGB 9.1* 9.2* 8.1* 8.7* 8.3* 8.5*  HCT 27.3* 27.0* 23.4* 25.4* 24.3* 25.7*  PLT 139*  --  127* 142* 156 164  MCV 98.9  --  95.9 96.2 97.2 97.3  MCH 33.0  --  33.2 33.0 33.2 32.2  MCHC 33.3  --  34.6 34.3 34.2 33.1  RDW 13.8  --  13.8 14.0 13.9 13.8  LYMPHSABS 1.4  --  0.8 1.4 0.9 1.0  MONOABS 0.9  --  0.6 0.6 0.5 0.6  EOSABS 0.0  --  0.0 0.2 0.3 0.3  BASOSABS 0.0  --  0.0 0.0 0.0 0.0    Recent Labs  Lab 02/11/21 1830 02/11/21 2019 02/12/21 0500 02/12/21 0508 02/12/21 0730 02/12/21 0911 02/13/21 0330 02/14/21 0309 02/15/21 0449 02/16/21 0354  NA 137  --  137 138  --   --  134* 136 137 139  K 4.2  --  4.1 4.1  --   --  3.7 3.7 4.0 3.7  CL 104  --  106  --   --   --  106 108 107 108  CO2 21*  --  21*  --   --   --  20* 20* 21* 21*  GLUCOSE 200*  --  127*  --   --   --  178* 144* 148* 143*  BUN 24*  --  25*  --   --   --  25* 20 24* 22  CREATININE 1.58*  --  1.51*  --   --   --  1.60* 1.64* 1.62* 1.57*  CALCIUM 9.7  --  9.1  --   --   --  8.9 9.1 8.9 9.0  AST 36  --  20  --   --   --  23 19 19 20   ALT 27  --  23  --   --   --  18 17 18 21   ALKPHOS 79  --  58  --   --   --  55 60 58 60  BILITOT 1.6*  --  1.1  --   --   --  0.9 0.8 0.7 1.0  ALBUMIN 4.0  --  3.2*  --   --   --  2.9* 3.0* 2.7* 2.9*  MG  --   --  1.4*  --   --   --  1.9 1.9 1.9 2.0  CRP  --   --   --   --   --   --  18.7* 17.9* 14.0* 10.4*  PROCALCITON  --   --   --   --  7.52  --  6.51 4.49 2.55 1.42  LATICACIDVEN 1.8 1.3  --   --   --   --   --   --   --   --   INR  --   --  1.2  --   --   --   --   --   --   --   TSH  --   --   --   --  2.050  --   --   --   --   --   HGBA1C  --   --   --   --   --  6.9*  --   --   --   --   BNP  --   --   --   --   --   --  489.5* 375.9* 165.9* 247.5*     ------------------------------------------------------------------------------------------------------------------ No results for input(s): CHOL, HDL, LDLCALC, TRIG, CHOLHDL, LDLDIRECT in the last 72 hours.  Lab Results  Component Value Date   HGBA1C 6.9 (H) 02/12/2021   ------------------------------------------------------------------------------------------------------------------ No results for input(s): TSH, T4TOTAL, T3FREE,  THYROIDAB in the last 72 hours.  Invalid input(s): FREET3   Cardiac Enzymes No results for input(s): CKMB, TROPONINI, MYOGLOBIN in the last 168 hours.  Invalid input(s): CK ------------------------------------------------------------------------------------------------------------------    Component Value Date/Time   BNP 247.5 (H) 02/16/2021 0354     Radiology Reports CT HEAD WO CONTRAST (5MM)  Result Date: 02/11/2021 CLINICAL DATA:  Weakness, fatigue EXAM: CT HEAD WITHOUT CONTRAST TECHNIQUE: Contiguous axial images were obtained from the base of the skull through the vertex without intravenous contrast. COMPARISON:  10/18/2018 FINDINGS: Brain: Chronic small vessel ischemic changes are again seen throughout the periventricular white matter, stable. No signs of acute infarct or hemorrhage. Lateral ventricles and remaining midline structures are unremarkable. There are no acute extra-axial fluid collections. No mass effect. Vascular: No hyperdense vessel or unexpected calcification. Skull: Normal. Negative for fracture or focal lesion. Sinuses/Orbits: Mild mucosal thickening within the ethmoid air cells. Remaining paranasal sinuses are clear. Other: None. IMPRESSION: 1. Stable chronic ischemic changes as above. No acute intracranial process. Electronically Signed   By: Randa Ngo M.D.   On: 02/11/2021 20:44   MR BRAIN WO CONTRAST  Result Date: 02/12/2021 CLINICAL DATA:  Acute neurologic deficit EXAM: MRI HEAD WITHOUT CONTRAST TECHNIQUE: Multiplanar,  multiecho pulse sequences of the brain and surrounding structures were obtained without intravenous contrast. COMPARISON:  None. FINDINGS: Brain: No acute infarct, mass effect or extra-axial collection. No acute or chronic hemorrhage. There is multifocal hyperintense T2-weighted signal within the white matter. Diffuse, severe atrophy. The midline structures are normal. Vascular: Major flow voids are preserved. Skull and upper cervical spine: Normal calvarium and skull base. Visualized upper cervical spine and soft tissues are normal. Sinuses/Orbits:No paranasal sinus fluid levels or advanced mucosal thickening. No mastoid or middle ear effusion. Normal orbits. IMPRESSION: 1. No acute intracranial abnormality. 2. Diffuse, severe atrophy and findings of chronic microvascular ischemia. Electronically Signed   By: Ulyses Jarred M.D.   On: 02/12/2021 23:33   CT ABDOMEN PELVIS W CONTRAST  Result Date: 02/11/2021 CLINICAL DATA:  Nausea, vomiting, anorexia, oliguria. Abdominal pain and back pain. EXAM: CT ABDOMEN AND PELVIS WITH CONTRAST TECHNIQUE: Multidetector CT imaging of the abdomen and pelvis was performed using the standard protocol following bolus administration of intravenous contrast. CONTRAST:  16mL OMNIPAQUE IOHEXOL 350 MG/ML SOLN COMPARISON:  07/12/2020 FINDINGS: Lower chest: Patchy infiltrate is seen within the a posterior basal right lower lobe, possibly infectious or inflammatory in nature. Mild left basilar atelectasis. No pleural effusion. Cardiac size within normal limits. No pericardial effusion. Hepatobiliary: No focal liver abnormality is seen. Status post cholecystectomy. No biliary dilatation. Pancreas: Unremarkable Spleen: Unremarkable Adrenals/Urinary Tract: The adrenal glands are unremarkable. The kidneys are normal in size and position. Stable 7 mm nonobstructing calculus within the lower pole the left kidney. No ureteral calculi. No hydronephrosis. Multiple simple bilateral cortical cysts  are again identified. There is a stable 20 mm partially exophytic low-attenuation lesion arising from the upper pole of the right kidney which may represent a hyperdense renal cyst or cystic renal mass, but is not well characterized on this single-phase examination. This appears stable, however, since remote prior examination of 01/22/2015 and its stability over time favors a benign lesion. The bladder is unremarkable. Stomach/Bowel: Stomach is within normal limits. Appendix appears normal. No evidence of bowel wall thickening, distention, or inflammatory changes. Vascular/Lymphatic: Aortic atherosclerosis. No enlarged abdominal or pelvic lymph nodes. Reproductive: Prostatectomy and pelvic lymph node dissection has been performed. No recurrent mass lesion. Other: No abdominal wall hernia.  Rectum unremarkable. Musculoskeletal: Bilateral hip ORIF has been performed. Healed fractures the right superior and inferior pubic rami noted. Degenerative changes are seen within the lumbar spine. No acute bone abnormality. No lytic or blastic bone lesion. IMPRESSION: No acute intra-abdominal pathology identified. No definite radiographic explanation for the patient's reported symptoms. Patchy infiltrate within the visualized right lung base, possibly infectious or inflammatory. Stable mild left nonobstructing nephrolithiasis. Indeterminate low-attenuation exophytic lesion arising from the upper pole of the right kidney, stable since prior examination of 01/22/2015. Its stability over time, however, favors a hyperdense renal cyst. Aortic Atherosclerosis (ICD10-I70.0). Electronically Signed   By: Fidela Salisbury M.D.   On: 02/11/2021 20:55   DG Chest Port 1 View  Result Date: 02/15/2021 CLINICAL DATA:  Shortness of breath EXAM: PORTABLE CHEST 1 VIEW COMPARISON:  Four days ago FINDINGS: Right upper lobe airspace disease. There is no edema, effusion, or pneumothorax. Normal heart size and stable mediastinal contours.  IMPRESSION: Unchanged right upper lobe pneumonia. Electronically Signed   By: Jorje Guild M.D.   On: 02/15/2021 08:09   DG Chest Portable 1 View  Result Date: 02/11/2021 CLINICAL DATA:  Weakness, chest pain EXAM: PORTABLE CHEST 1 VIEW COMPARISON:  Chest x-ray 10/14/2018 FINDINGS: The heart and mediastinal contours are within normal limits. Vague right perihilar airspace density may be due to rotation. No pulmonary edema. No pleural effusion. No pneumothorax. No acute osseous abnormality. IMPRESSION: Low lung volumes with vague right perihilar airspace density may be due to rotation. Otherwise no acute cardiopulmonary abnormality. Electronically Signed   By: Iven Finn M.D.   On: 02/11/2021 19:15   CT HEAD CODE STROKE WO CONTRAST  Result Date: 02/12/2021 CLINICAL DATA:  Facial droop and dysarthria EXAM: CT HEAD WITHOUT CONTRAST CT ANGIOGRAPHY OF THE HEAD AND NECK TECHNIQUE: Contiguous axial images were obtained from the base of the skull through the vertex without intravenous contrast. Multidetector CT imaging of the head and neck was performed using the standard protocol during bolus administration of intravenous contrast. Multiplanar CT image reconstructions and MIPs were obtained to evaluate the vascular anatomy. Carotid stenosis measurements (when applicable) are obtained utilizing NASCET criteria, using the distal internal carotid diameter as the denominator. CONTRAST:  59mL OMNIPAQUE IOHEXOL 350 MG/ML SOLN COMPARISON:  None. FINDINGS: CT HEAD FINDINGS Brain: There is no mass, hemorrhage or extra-axial collection. There is generalized atrophy without lobar predilection. There is hypoattenuation of the periventricular white matter, most commonly indicating chronic ischemic microangiopathy. Vascular: No abnormal hyperdensity of the major intracranial arteries or dural venous sinuses. No intracranial atherosclerosis. Skull: The visualized skull base, calvarium and extracranial soft tissues are  normal. Sinuses/Orbits: No fluid levels or advanced mucosal thickening of the visualized paranasal sinuses. No mastoid or middle ear effusion. The orbits are normal. ASPECTS ([place] Stroke Program Early CT Score) - Ganglionic level infarction (caudate, lentiform nuclei, internal capsule, insula, M1-M3 cortex): 7 - Supraganglionic infarction (M4-M6 cortex): 3 Total score (0-10 with 10 being normal): 10 CTA NECK FINDINGS SKELETON: There is no bony spinal canal stenosis. No lytic or blastic lesion. OTHER NECK: Subcentimeter right intraparotid lymph node. Normal left parotid gland. 8 mm right submandibular lymph node. Submandibular glands are normal. UPPER CHEST: Right upper lobe consolidation. AORTIC ARCH: There is no calcific atherosclerosis of the aortic arch. There is no aneurysm, dissection or hemodynamically significant stenosis of the visualized portion of the aorta. Conventional 3 vessel aortic branching pattern. The visualized proximal subclavian arteries are widely patent. RIGHT CAROTID SYSTEM: Normal without aneurysm, dissection or  stenosis. LEFT CAROTID SYSTEM: Normal without aneurysm, dissection or stenosis. VERTEBRAL ARTERIES: Left dominant configuration. Both origins are clearly patent. There is no dissection, occlusion or flow-limiting stenosis to the skull base (V1-V3 segments). CTA HEAD FINDINGS POSTERIOR CIRCULATION: --Vertebral arteries: Normal V4 segments. --Inferior cerebellar arteries: Normal. --Basilar artery: Normal. --Superior cerebellar arteries: Normal. --Posterior cerebral arteries (PCA): Normal. ANTERIOR CIRCULATION: --Intracranial internal carotid arteries: Normal. --Anterior cerebral arteries (ACA): Normal. Both A1 segments are present. Patent anterior communicating artery (a-comm). --Middle cerebral arteries (MCA): Normal. VENOUS SINUSES: As permitted by contrast timing, patent. ANATOMIC VARIANTS: None Review of the MIP images confirms the above findings. IMPRESSION: 1. No emergent  large vessel occlusion or high-grade stenosis of the head or neck. 2. Right upper lobe consolidation, concerning for pneumonia. These results were called by telephone at the time of interpretation on 02/12/2021 at 9:14 pm to provider [facility] , who verbally acknowledged these results. Electronically Signed   By: Ulyses Jarred M.D.   On: 02/12/2021 21:14   CT ANGIO HEAD CODE STROKE  Result Date: 02/12/2021 CLINICAL DATA:  Facial droop and dysarthria EXAM: CT HEAD WITHOUT CONTRAST CT ANGIOGRAPHY OF THE HEAD AND NECK TECHNIQUE: Contiguous axial images were obtained from the base of the skull through the vertex without intravenous contrast. Multidetector CT imaging of the head and neck was performed using the standard protocol during bolus administration of intravenous contrast. Multiplanar CT image reconstructions and MIPs were obtained to evaluate the vascular anatomy. Carotid stenosis measurements (when applicable) are obtained utilizing NASCET criteria, using the distal internal carotid diameter as the denominator. CONTRAST:  64mL OMNIPAQUE IOHEXOL 350 MG/ML SOLN COMPARISON:  None. FINDINGS: CT HEAD FINDINGS Brain: There is no mass, hemorrhage or extra-axial collection. There is generalized atrophy without lobar predilection. There is hypoattenuation of the periventricular white matter, most commonly indicating chronic ischemic microangiopathy. Vascular: No abnormal hyperdensity of the major intracranial arteries or dural venous sinuses. No intracranial atherosclerosis. Skull: The visualized skull base, calvarium and extracranial soft tissues are normal. Sinuses/Orbits: No fluid levels or advanced mucosal thickening of the visualized paranasal sinuses. No mastoid or middle ear effusion. The orbits are normal. ASPECTS ([place] Stroke Program Early CT Score) - Ganglionic level infarction (caudate, lentiform nuclei, internal capsule, insula, M1-M3 cortex): 7 - Supraganglionic infarction (M4-M6 cortex): 3  Total score (0-10 with 10 being normal): 10 CTA NECK FINDINGS SKELETON: There is no bony spinal canal stenosis. No lytic or blastic lesion. OTHER NECK: Subcentimeter right intraparotid lymph node. Normal left parotid gland. 8 mm right submandibular lymph node. Submandibular glands are normal. UPPER CHEST: Right upper lobe consolidation. AORTIC ARCH: There is no calcific atherosclerosis of the aortic arch. There is no aneurysm, dissection or hemodynamically significant stenosis of the visualized portion of the aorta. Conventional 3 vessel aortic branching pattern. The visualized proximal subclavian arteries are widely patent. RIGHT CAROTID SYSTEM: Normal without aneurysm, dissection or stenosis. LEFT CAROTID SYSTEM: Normal without aneurysm, dissection or stenosis. VERTEBRAL ARTERIES: Left dominant configuration. Both origins are clearly patent. There is no dissection, occlusion or flow-limiting stenosis to the skull base (V1-V3 segments). CTA HEAD FINDINGS POSTERIOR CIRCULATION: --Vertebral arteries: Normal V4 segments. --Inferior cerebellar arteries: Normal. --Basilar artery: Normal. --Superior cerebellar arteries: Normal. --Posterior cerebral arteries (PCA): Normal. ANTERIOR CIRCULATION: --Intracranial internal carotid arteries: Normal. --Anterior cerebral arteries (ACA): Normal. Both A1 segments are present. Patent anterior communicating artery (a-comm). --Middle cerebral arteries (MCA): Normal. VENOUS SINUSES: As permitted by contrast timing, patent. ANATOMIC VARIANTS: None Review of the MIP images confirms the above  findings. IMPRESSION: 1. No emergent large vessel occlusion or high-grade stenosis of the head or neck. 2. Right upper lobe consolidation, concerning for pneumonia. These results were called by telephone at the time of interpretation on 02/12/2021 at 9:14 pm to provider [facility] , who verbally acknowledged these results. Electronically Signed   By: Ulyses Jarred M.D.   On: 02/12/2021 21:14    CT ANGIO NECK CODE STROKE  Result Date: 02/12/2021 CLINICAL DATA:  Facial droop and dysarthria EXAM: CT HEAD WITHOUT CONTRAST CT ANGIOGRAPHY OF THE HEAD AND NECK TECHNIQUE: Contiguous axial images were obtained from the base of the skull through the vertex without intravenous contrast. Multidetector CT imaging of the head and neck was performed using the standard protocol during bolus administration of intravenous contrast. Multiplanar CT image reconstructions and MIPs were obtained to evaluate the vascular anatomy. Carotid stenosis measurements (when applicable) are obtained utilizing NASCET criteria, using the distal internal carotid diameter as the denominator. CONTRAST:  43mL OMNIPAQUE IOHEXOL 350 MG/ML SOLN COMPARISON:  None. FINDINGS: CT HEAD FINDINGS Brain: There is no mass, hemorrhage or extra-axial collection. There is generalized atrophy without lobar predilection. There is hypoattenuation of the periventricular white matter, most commonly indicating chronic ischemic microangiopathy. Vascular: No abnormal hyperdensity of the major intracranial arteries or dural venous sinuses. No intracranial atherosclerosis. Skull: The visualized skull base, calvarium and extracranial soft tissues are normal. Sinuses/Orbits: No fluid levels or advanced mucosal thickening of the visualized paranasal sinuses. No mastoid or middle ear effusion. The orbits are normal. ASPECTS ([place] Stroke Program Early CT Score) - Ganglionic level infarction (caudate, lentiform nuclei, internal capsule, insula, M1-M3 cortex): 7 - Supraganglionic infarction (M4-M6 cortex): 3 Total score (0-10 with 10 being normal): 10 CTA NECK FINDINGS SKELETON: There is no bony spinal canal stenosis. No lytic or blastic lesion. OTHER NECK: Subcentimeter right intraparotid lymph node. Normal left parotid gland. 8 mm right submandibular lymph node. Submandibular glands are normal. UPPER CHEST: Right upper lobe consolidation. AORTIC ARCH: There is no  calcific atherosclerosis of the aortic arch. There is no aneurysm, dissection or hemodynamically significant stenosis of the visualized portion of the aorta. Conventional 3 vessel aortic branching pattern. The visualized proximal subclavian arteries are widely patent. RIGHT CAROTID SYSTEM: Normal without aneurysm, dissection or stenosis. LEFT CAROTID SYSTEM: Normal without aneurysm, dissection or stenosis. VERTEBRAL ARTERIES: Left dominant configuration. Both origins are clearly patent. There is no dissection, occlusion or flow-limiting stenosis to the skull base (V1-V3 segments). CTA HEAD FINDINGS POSTERIOR CIRCULATION: --Vertebral arteries: Normal V4 segments. --Inferior cerebellar arteries: Normal. --Basilar artery: Normal. --Superior cerebellar arteries: Normal. --Posterior cerebral arteries (PCA): Normal. ANTERIOR CIRCULATION: --Intracranial internal carotid arteries: Normal. --Anterior cerebral arteries (ACA): Normal. Both A1 segments are present. Patent anterior communicating artery (a-comm). --Middle cerebral arteries (MCA): Normal. VENOUS SINUSES: As permitted by contrast timing, patent. ANATOMIC VARIANTS: None Review of the MIP images confirms the above findings. IMPRESSION: 1. No emergent large vessel occlusion or high-grade stenosis of the head or neck. 2. Right upper lobe consolidation, concerning for pneumonia. These results were called by telephone at the time of interpretation on 02/12/2021 at 9:14 pm to provider [facility] , who verbally acknowledged these results. Electronically Signed   By: Ulyses Jarred M.D.   On: 02/12/2021 21:14

## 2021-02-17 LAB — GLUCOSE, CAPILLARY
Glucose-Capillary: 131 mg/dL — ABNORMAL HIGH (ref 70–99)
Glucose-Capillary: 209 mg/dL — ABNORMAL HIGH (ref 70–99)
Glucose-Capillary: 148 mg/dL — ABNORMAL HIGH (ref 70–99)
Glucose-Capillary: 246 mg/dL — ABNORMAL HIGH (ref 70–99)

## 2021-02-17 MED ORDER — CARVEDILOL 3.125 MG PO TABS
3.1250 mg | ORAL_TABLET | Freq: Two times a day (BID) | ORAL | Status: DC
  Administered 2021-02-17 – 2021-02-24 (×16): 3.125 mg via ORAL
  Filled 2021-02-17 (×16): qty 1

## 2021-02-17 NOTE — TOC Progression Note (Signed)
Transition of Care [facility]) - Progression Note    Patient Details  Name: Tyler [name] MRN: [id number] Date of Birth: [birth year]  Transition of Care [facility]) CM/SW [place], [place] Phone Number: 02/17/2021, 3:22 PM  Clinical Narrative:   CSW following for discharge plan. Family is still concerned about patient's functioning and do not feel that he is at baseline, which is independent for ambulation. CSW sent message to PT seeing patient today to evaluate for SNF, and PT in agreement based on performance today. CSW has asked [employer] to review. CSW left message with UR RN for [employer] to initiate insurance authorization. CSW to follow.    Expected Discharge Plan: [place] Barriers to Discharge: Continued Medical Work up, Ship broker  Expected Discharge Plan and Services Expected Discharge Plan: [place] Acute Care Choice: NA Living arrangements for the past 2 months: Single Family Home Expected Discharge Date: 02/17/21                                     Social Determinants of Health (SDOH) Interventions    Readmission Risk Interventions No flowsheet data found.

## 2021-02-17 NOTE — Progress Notes (Signed)
PROGRESS NOTE                                                                                                                                                                                                             Patient Demographics:    Tyler [name], is a 87 y.o. male, DOB - 06/25/1928, VQM:[id number]  Outpatient Primary MD for the patient is Tyler Pretty, MD    LOS - 6  Admit date - 02/11/2021    Chief Complaint  Patient presents with   Weakness       Brief Narrative (HPI from H&P)   Tyler [name] is a 87 y.o. male with medical history significant for type 2 diabetes mellitus, essential hypertension, hyperlipidemia, stage IIIa chronic kidney disease with baseline creatinine reported 1.7, anemia of chronic disease with baseline hemoglobin 9-12, who presented from home to [facility] ED for evaluation of altered mental status, had Asp. PNA.   Subjective:   Patient in bed, appears comfortable, denies any headache, no fever, no chest pain or pressure, no shortness of breath , no abdominal pain. No new focal weakness.    Assessment  & Plan :      Acute Toxic encephalopathy due to aspiration pneumonia and now with delirium - had detailed discussion with patient's son and life, currently has been having problems swallowing food or liquids for several weeks, post IVF and Unasyn, Speech following.  Head CT & MRI are unremarkable and he has no focal neurological deficits.  He does not have sepsis.  Will monitor closely, minimized benzodiazepines and narcotics, nighttime Seroquel and as needed Haldol, much better, await bed.  Encouraged the patient to sit up in chair in the daytime use I-S and flutter valve for pulmonary toiletry.  Will advance activity and titrate down oxygen as possible.   2.  Weakness and deconditioning due to #1 above.  PT OT and monitor.  3.  Insignificant mild elevation in troponin patient ACS pattern.  Chest  pain-free.  EKG is nonacute, continue statin and Plavix for secondary prevention.  No further work-up.  Mild troponin rise likely underlying infection.  4.  Essential hypertension.  On Norvasc + Coreg.  5.  Dyslipidemia.  Statin.  6.  CKD 3B.  Baseline creatinine around 1.5.  Stable.  Monitor with gentle hydration.  7.  GERD.  Placed  on Pepcid.  8.  AOCD.  Stable no acute issue.  9.  History of CVA in the past.  No new focal deficits, head CT stable, no headache, continue Plavix and statin for secondary prevention.  10. Hypomagnesemia.  Replaced.    11. DM type II.  Sliding scale.  This SmartLink has not been configured with any valid records.   CBG (last 3)  Recent Labs    02/16/21 1628 02/16/21 2133 02/17/21 0704  GLUCAP 200* 193* 131*         Condition -  Guarded  Family Communication  :    [name] [phone] 02/12/21 at 8.54 am message left  Called son Fishel [phone] + Wife 02/12/21 updated, son updated 02/14/20, 02/14/21, wife bedside 02/15/21, message left for Maahir 02/16/21 at 9.31  DW wife [name] [phone] 02/17/21   Code Status :  DNR  Consults  :  None  PUD Prophylaxis :    Procedures  :     MRI brain - Non acute  CT Head - 1. Stable chronic ischemic changes as above. No acute intracranial process  CT ABD  - Pelvis - No acute intra-abdominal pathology identified. No definite radiographic explanation for the patient's reported symptoms. Patchy infiltrate within the visualized right lung base, possibly infectious or inflammatory. Stable mild left nonobstructing nephrolithiasis. Indeterminate low-attenuation exophytic lesion arising from the upper pole of the right kidney, stable since prior examination of 01/22/2015. Its stability over time, however, favors a hyperdense renal cyst. Aortic Atherosclerosis (ICD10-I70.0).      Disposition Plan  :    Status is: Inpatient  Remains inpatient appropriate because:IV treatments  appropriate due to intensity of illness or inability to take PO  Dispo: The patient is from: Home              Anticipated d/c is to: SNF              Patient currently is not medically stable to d/c.   Difficult to place patient No  DVT Prophylaxis  :    enoxaparin (LOVENOX) injection 30 mg Start: 02/12/21 1600 SCDs Start: 02/11/21 2144  Lab Results  Component Value Date   PLT 164 02/16/2021    Diet :  Diet Order             Diet regular Room service appropriate? Yes with Assist; Fluid consistency: Thin  Diet effective now                    Inpatient Medications  Scheduled Meds:  amLODipine  10 mg Oral Daily   atorvastatin  40 mg Oral Daily   carvedilol  3.125 mg Oral BID WC   clopidogrel  75 mg Oral Q breakfast   docusate sodium  100 mg Oral QHS   enoxaparin (LOVENOX) injection  30 mg Subcutaneous Q24H   famotidine  20 mg Oral Daily   insulin aspart  0-6 Units Subcutaneous TID WC   QUEtiapine  12.5 mg Oral QHS   vitamin B-12  1,000 mcg Oral QHS   Continuous Infusions:   PRN Meds:.acetaminophen **OR** acetaminophen, hydrALAZINE, polyethylene glycol  Time Spent in minutes  30   [name] M.D on 02/17/2021 at 10:19 AM  To page go to [email or web address]   [facility] -  Office  [phone]  See all Orders from today for further details    Objective:   Vitals:   02/16/21 2030 02/16/21 2224 02/17/21 0843 02/17/21 0930  BP: (!) 160/70 [name])  159/78 (!) 157/66   Pulse: 79 85 80   Resp: 18 17 17 16   Temp: 98.7 F (37.1 C) 99.1 F (37.3 C) 97.8 F (36.6 C)   TempSrc: Oral Axillary Oral   SpO2: 94% 97% 93%   Weight:      Height:        Wt Readings from Last 3 Encounters:  02/15/21 79.2 kg  10/12/18 70.3 kg  07/11/18 74.4 kg     Intake/Output Summary (Last 24 hours) at 02/17/2021 1019 Last data filed at 02/16/2021 1800 Gross per 24 hour  Intake 300 ml  Output 550 ml  Net -250 ml     Physical Exam  Awake but confused, No new  F.N deficits,   [place].AT,[name], [name],   Symmetrical Chest wall movement, Good air movement bilaterally, CTAB RRR,No Gallops, Rubs or new Murmurs,  +ve B.Sounds, Abd Soft, No tenderness,   No Cyanosis, Clubbing or edema,      Data Review:    CBC Recent Labs  Lab 02/12/21 0500 02/12/21 0508 02/13/21 0330 02/14/21 0309 02/15/21 0449 02/16/21 0354  WBC 15.5*  --  9.9 9.7 6.3 7.1  HGB 9.1* 9.2* 8.1* 8.7* 8.3* 8.5*  HCT 27.3* 27.0* 23.4* 25.4* 24.3* 25.7*  PLT 139*  --  127* 142* 156 164  MCV 98.9  --  95.9 96.2 97.2 97.3  MCH 33.0  --  33.2 33.0 33.2 32.2  MCHC 33.3  --  34.6 34.3 34.2 33.1  RDW 13.8  --  13.8 14.0 13.9 13.8  LYMPHSABS 1.4  --  0.8 1.4 0.9 1.0  MONOABS 0.9  --  0.6 0.6 0.5 0.6  EOSABS 0.0  --  0.0 0.2 0.3 0.3  BASOSABS 0.0  --  0.0 0.0 0.0 0.0    Recent Labs  Lab 02/11/21 1830 02/11/21 2019 02/12/21 0500 02/12/21 0508 02/12/21 0730 02/12/21 0911 02/13/21 0330 02/14/21 0309 02/15/21 0449 02/16/21 0354  NA 137  --  137 138  --   --  134* 136 137 139  K 4.2  --  4.1 4.1  --   --  3.7 3.7 4.0 3.7  CL 104  --  106  --   --   --  106 108 107 108  CO2 21*  --  21*  --   --   --  20* 20* 21* 21*  GLUCOSE 200*  --  127*  --   --   --  178* 144* 148* 143*  BUN 24*  --  25*  --   --   --  25* 20 24* 22  CREATININE 1.58*  --  1.51*  --   --   --  1.60* 1.64* 1.62* 1.57*  CALCIUM 9.7  --  9.1  --   --   --  8.9 9.1 8.9 9.0  AST 36  --  20  --   --   --  23 19 19 20   ALT 27  --  23  --   --   --  18 17 18 21   ALKPHOS 79  --  58  --   --   --  55 60 58 60  BILITOT 1.6*  --  1.1  --   --   --  0.9 0.8 0.7 1.0  ALBUMIN 4.0  --  3.2*  --   --   --  2.9* 3.0* 2.7* 2.9*  MG  --   --  1.4*  --   --   --  1.9 1.9 1.9 2.0  CRP  --   --   --   --   --   --  18.7* 17.9* 14.0* 10.4*  PROCALCITON  --   --   --   --  7.52  --  6.51 4.49 2.55 1.42  LATICACIDVEN 1.8 1.3  --   --   --   --   --   --   --   --   INR  --   --  1.2  --   --   --   --   --   --    --   TSH  --   --   --   --  2.050  --   --   --   --   --   HGBA1C  --   --   --   --   --  6.9*  --   --   --   --   BNP  --   --   --   --   --   --  489.5* 375.9* 165.9* 247.5*    ------------------------------------------------------------------------------------------------------------------ No results for input(s): CHOL, HDL, LDLCALC, TRIG, CHOLHDL, LDLDIRECT in the last 72 hours.  Lab Results  Component Value Date   HGBA1C 6.9 (H) 02/12/2021   ------------------------------------------------------------------------------------------------------------------ No results for input(s): TSH, T4TOTAL, T3FREE, THYROIDAB in the last 72 hours.  Invalid input(s): FREET3   Cardiac Enzymes No results for input(s): CKMB, TROPONINI, MYOGLOBIN in the last 168 hours.  Invalid input(s): CK ------------------------------------------------------------------------------------------------------------------    Component Value Date/Time   BNP 247.5 (H) 02/16/2021 0354     Radiology Reports CT HEAD WO CONTRAST (5MM)  Result Date: 02/11/2021 CLINICAL DATA:  Weakness, fatigue EXAM: CT HEAD WITHOUT CONTRAST TECHNIQUE: Contiguous axial images were obtained from the base of the skull through the vertex without intravenous contrast. COMPARISON:  10/18/2018 FINDINGS: Brain: Chronic small vessel ischemic changes are again seen throughout the periventricular white matter, stable. No signs of acute infarct or hemorrhage. Lateral ventricles and remaining midline structures are unremarkable. There are no acute extra-axial fluid collections. No mass effect. Vascular: No hyperdense vessel or unexpected calcification. Skull: Normal. Negative for fracture or focal lesion. Sinuses/Orbits: Mild mucosal thickening within the ethmoid air cells. Remaining paranasal sinuses are clear. Other: None. IMPRESSION: 1. Stable chronic ischemic changes as above. No acute intracranial process. Electronically Signed   By: Randa Ngo M.D.   On: 02/11/2021 20:44   MR BRAIN WO CONTRAST  Result Date: 02/12/2021 CLINICAL DATA:  Acute neurologic deficit EXAM: MRI HEAD WITHOUT CONTRAST TECHNIQUE: Multiplanar, multiecho pulse sequences of the brain and surrounding structures were obtained without intravenous contrast. COMPARISON:  None. FINDINGS: Brain: No acute infarct, mass effect or extra-axial collection. No acute or chronic hemorrhage. There is multifocal hyperintense T2-weighted signal within the white matter. Diffuse, severe atrophy. The midline structures are normal. Vascular: Major flow voids are preserved. Skull and upper cervical spine: Normal calvarium and skull base. Visualized upper cervical spine and soft tissues are normal. Sinuses/Orbits:No paranasal sinus fluid levels or advanced mucosal thickening. No mastoid or middle ear effusion. Normal orbits. IMPRESSION: 1. No acute intracranial abnormality. 2. Diffuse, severe atrophy and findings of chronic microvascular ischemia. Electronically Signed   By: Ulyses Jarred M.D.   On: 02/12/2021 23:33   CT ABDOMEN PELVIS W CONTRAST  Result Date: 02/11/2021 CLINICAL DATA:  Nausea, vomiting, anorexia, oliguria. Abdominal pain and back pain. EXAM: CT ABDOMEN AND PELVIS WITH CONTRAST TECHNIQUE:  Multidetector CT imaging of the abdomen and pelvis was performed using the standard protocol following bolus administration of intravenous contrast. CONTRAST:  48mL OMNIPAQUE IOHEXOL 350 MG/ML SOLN COMPARISON:  07/12/2020 FINDINGS: Lower chest: Patchy infiltrate is seen within the a posterior basal right lower lobe, possibly infectious or inflammatory in nature. Mild left basilar atelectasis. No pleural effusion. Cardiac size within normal limits. No pericardial effusion. Hepatobiliary: No focal liver abnormality is seen. Status post cholecystectomy. No biliary dilatation. Pancreas: Unremarkable Spleen: Unremarkable Adrenals/Urinary Tract: The adrenal glands are unremarkable. The kidneys are  normal in size and position. Stable 7 mm nonobstructing calculus within the lower pole the left kidney. No ureteral calculi. No hydronephrosis. Multiple simple bilateral cortical cysts are again identified. There is a stable 20 mm partially exophytic low-attenuation lesion arising from the upper pole of the right kidney which may represent a hyperdense renal cyst or cystic renal mass, but is not well characterized on this single-phase examination. This appears stable, however, since remote prior examination of 01/22/2015 and its stability over time favors a benign lesion. The bladder is unremarkable. Stomach/Bowel: Stomach is within normal limits. Appendix appears normal. No evidence of bowel wall thickening, distention, or inflammatory changes. Vascular/Lymphatic: Aortic atherosclerosis. No enlarged abdominal or pelvic lymph nodes. Reproductive: Prostatectomy and pelvic lymph node dissection has been performed. No recurrent mass lesion. Other: No abdominal wall hernia.  Rectum unremarkable. Musculoskeletal: Bilateral hip ORIF has been performed. Healed fractures the right superior and inferior pubic rami noted. Degenerative changes are seen within the lumbar spine. No acute bone abnormality. No lytic or blastic bone lesion. IMPRESSION: No acute intra-abdominal pathology identified. No definite radiographic explanation for the patient's reported symptoms. Patchy infiltrate within the visualized right lung base, possibly infectious or inflammatory. Stable mild left nonobstructing nephrolithiasis. Indeterminate low-attenuation exophytic lesion arising from the upper pole of the right kidney, stable since prior examination of 01/22/2015. Its stability over time, however, favors a hyperdense renal cyst. Aortic Atherosclerosis (ICD10-I70.0). Electronically Signed   By: Fidela Salisbury M.D.   On: 02/11/2021 20:55   DG Chest Port 1 View  Result Date: 02/15/2021 CLINICAL DATA:  Shortness of breath EXAM: PORTABLE CHEST 1  VIEW COMPARISON:  Four days ago FINDINGS: Right upper lobe airspace disease. There is no edema, effusion, or pneumothorax. Normal heart size and stable mediastinal contours. IMPRESSION: Unchanged right upper lobe pneumonia. Electronically Signed   By: Jorje Guild M.D.   On: 02/15/2021 08:09   DG Chest Portable 1 View  Result Date: 02/11/2021 CLINICAL DATA:  Weakness, chest pain EXAM: PORTABLE CHEST 1 VIEW COMPARISON:  Chest x-ray 10/14/2018 FINDINGS: The heart and mediastinal contours are within normal limits. Vague right perihilar airspace density may be due to rotation. No pulmonary edema. No pleural effusion. No pneumothorax. No acute osseous abnormality. IMPRESSION: Low lung volumes with vague right perihilar airspace density may be due to rotation. Otherwise no acute cardiopulmonary abnormality. Electronically Signed   By: Iven Finn M.D.   On: 02/11/2021 19:15   CT HEAD CODE STROKE WO CONTRAST  Result Date: 02/12/2021 CLINICAL DATA:  Facial droop and dysarthria EXAM: CT HEAD WITHOUT CONTRAST CT ANGIOGRAPHY OF THE HEAD AND NECK TECHNIQUE: Contiguous axial images were obtained from the base of the skull through the vertex without intravenous contrast. Multidetector CT imaging of the head and neck was performed using the standard protocol during bolus administration of intravenous contrast. Multiplanar CT image reconstructions and MIPs were obtained to evaluate the vascular anatomy. Carotid stenosis measurements (when applicable) are  obtained utilizing NASCET criteria, using the distal internal carotid diameter as the denominator. CONTRAST:  23mL OMNIPAQUE IOHEXOL 350 MG/ML SOLN COMPARISON:  None. FINDINGS: CT HEAD FINDINGS Brain: There is no mass, hemorrhage or extra-axial collection. There is generalized atrophy without lobar predilection. There is hypoattenuation of the periventricular white matter, most commonly indicating chronic ischemic microangiopathy. Vascular: No abnormal hyperdensity  of the major intracranial arteries or dural venous sinuses. No intracranial atherosclerosis. Skull: The visualized skull base, calvarium and extracranial soft tissues are normal. Sinuses/Orbits: No fluid levels or advanced mucosal thickening of the visualized paranasal sinuses. No mastoid or middle ear effusion. The orbits are normal. ASPECTS ([place] Stroke Program Early CT Score) - Ganglionic level infarction (caudate, lentiform nuclei, internal capsule, insula, M1-M3 cortex): 7 - Supraganglionic infarction (M4-M6 cortex): 3 Total score (0-10 with 10 being normal): 10 CTA NECK FINDINGS SKELETON: There is no bony spinal canal stenosis. No lytic or blastic lesion. OTHER NECK: Subcentimeter right intraparotid lymph node. Normal left parotid gland. 8 mm right submandibular lymph node. Submandibular glands are normal. UPPER CHEST: Right upper lobe consolidation. AORTIC ARCH: There is no calcific atherosclerosis of the aortic arch. There is no aneurysm, dissection or hemodynamically significant stenosis of the visualized portion of the aorta. Conventional 3 vessel aortic branching pattern. The visualized proximal subclavian arteries are widely patent. RIGHT CAROTID SYSTEM: Normal without aneurysm, dissection or stenosis. LEFT CAROTID SYSTEM: Normal without aneurysm, dissection or stenosis. VERTEBRAL ARTERIES: Left dominant configuration. Both origins are clearly patent. There is no dissection, occlusion or flow-limiting stenosis to the skull base (V1-V3 segments). CTA HEAD FINDINGS POSTERIOR CIRCULATION: --Vertebral arteries: Normal V4 segments. --Inferior cerebellar arteries: Normal. --Basilar artery: Normal. --Superior cerebellar arteries: Normal. --Posterior cerebral arteries (PCA): Normal. ANTERIOR CIRCULATION: --Intracranial internal carotid arteries: Normal. --Anterior cerebral arteries (ACA): Normal. Both A1 segments are present. Patent anterior communicating artery (a-comm). --Middle cerebral arteries (MCA):  Normal. VENOUS SINUSES: As permitted by contrast timing, patent. ANATOMIC VARIANTS: None Review of the MIP images confirms the above findings. IMPRESSION: 1. No emergent large vessel occlusion or high-grade stenosis of the head or neck. 2. Right upper lobe consolidation, concerning for pneumonia. These results were called by telephone at the time of interpretation on 02/12/2021 at 9:14 pm to provider [facility] , who verbally acknowledged these results. Electronically Signed   By: Ulyses Jarred M.D.   On: 02/12/2021 21:14   CT ANGIO HEAD CODE STROKE  Result Date: 02/12/2021 CLINICAL DATA:  Facial droop and dysarthria EXAM: CT HEAD WITHOUT CONTRAST CT ANGIOGRAPHY OF THE HEAD AND NECK TECHNIQUE: Contiguous axial images were obtained from the base of the skull through the vertex without intravenous contrast. Multidetector CT imaging of the head and neck was performed using the standard protocol during bolus administration of intravenous contrast. Multiplanar CT image reconstructions and MIPs were obtained to evaluate the vascular anatomy. Carotid stenosis measurements (when applicable) are obtained utilizing NASCET criteria, using the distal internal carotid diameter as the denominator. CONTRAST:  72mL OMNIPAQUE IOHEXOL 350 MG/ML SOLN COMPARISON:  None. FINDINGS: CT HEAD FINDINGS Brain: There is no mass, hemorrhage or extra-axial collection. There is generalized atrophy without lobar predilection. There is hypoattenuation of the periventricular white matter, most commonly indicating chronic ischemic microangiopathy. Vascular: No abnormal hyperdensity of the major intracranial arteries or dural venous sinuses. No intracranial atherosclerosis. Skull: The visualized skull base, calvarium and extracranial soft tissues are normal. Sinuses/Orbits: No fluid levels or advanced mucosal thickening of the visualized paranasal sinuses. No mastoid or middle ear effusion. The  orbits are normal. ASPECTS ([place] Stroke  Program Early CT Score) - Ganglionic level infarction (caudate, lentiform nuclei, internal capsule, insula, M1-M3 cortex): 7 - Supraganglionic infarction (M4-M6 cortex): 3 Total score (0-10 with 10 being normal): 10 CTA NECK FINDINGS SKELETON: There is no bony spinal canal stenosis. No lytic or blastic lesion. OTHER NECK: Subcentimeter right intraparotid lymph node. Normal left parotid gland. 8 mm right submandibular lymph node. Submandibular glands are normal. UPPER CHEST: Right upper lobe consolidation. AORTIC ARCH: There is no calcific atherosclerosis of the aortic arch. There is no aneurysm, dissection or hemodynamically significant stenosis of the visualized portion of the aorta. Conventional 3 vessel aortic branching pattern. The visualized proximal subclavian arteries are widely patent. RIGHT CAROTID SYSTEM: Normal without aneurysm, dissection or stenosis. LEFT CAROTID SYSTEM: Normal without aneurysm, dissection or stenosis. VERTEBRAL ARTERIES: Left dominant configuration. Both origins are clearly patent. There is no dissection, occlusion or flow-limiting stenosis to the skull base (V1-V3 segments). CTA HEAD FINDINGS POSTERIOR CIRCULATION: --Vertebral arteries: Normal V4 segments. --Inferior cerebellar arteries: Normal. --Basilar artery: Normal. --Superior cerebellar arteries: Normal. --Posterior cerebral arteries (PCA): Normal. ANTERIOR CIRCULATION: --Intracranial internal carotid arteries: Normal. --Anterior cerebral arteries (ACA): Normal. Both A1 segments are present. Patent anterior communicating artery (a-comm). --Middle cerebral arteries (MCA): Normal. VENOUS SINUSES: As permitted by contrast timing, patent. ANATOMIC VARIANTS: None Review of the MIP images confirms the above findings. IMPRESSION: 1. No emergent large vessel occlusion or high-grade stenosis of the head or neck. 2. Right upper lobe consolidation, concerning for pneumonia. These results were called by telephone at the time of  interpretation on 02/12/2021 at 9:14 pm to provider [facility] , who verbally acknowledged these results. Electronically Signed   By: Ulyses Jarred M.D.   On: 02/12/2021 21:14   CT ANGIO NECK CODE STROKE  Result Date: 02/12/2021 CLINICAL DATA:  Facial droop and dysarthria EXAM: CT HEAD WITHOUT CONTRAST CT ANGIOGRAPHY OF THE HEAD AND NECK TECHNIQUE: Contiguous axial images were obtained from the base of the skull through the vertex without intravenous contrast. Multidetector CT imaging of the head and neck was performed using the standard protocol during bolus administration of intravenous contrast. Multiplanar CT image reconstructions and MIPs were obtained to evaluate the vascular anatomy. Carotid stenosis measurements (when applicable) are obtained utilizing NASCET criteria, using the distal internal carotid diameter as the denominator. CONTRAST:  62mL OMNIPAQUE IOHEXOL 350 MG/ML SOLN COMPARISON:  None. FINDINGS: CT HEAD FINDINGS Brain: There is no mass, hemorrhage or extra-axial collection. There is generalized atrophy without lobar predilection. There is hypoattenuation of the periventricular white matter, most commonly indicating chronic ischemic microangiopathy. Vascular: No abnormal hyperdensity of the major intracranial arteries or dural venous sinuses. No intracranial atherosclerosis. Skull: The visualized skull base, calvarium and extracranial soft tissues are normal. Sinuses/Orbits: No fluid levels or advanced mucosal thickening of the visualized paranasal sinuses. No mastoid or middle ear effusion. The orbits are normal. ASPECTS ([place] Stroke Program Early CT Score) - Ganglionic level infarction (caudate, lentiform nuclei, internal capsule, insula, M1-M3 cortex): 7 - Supraganglionic infarction (M4-M6 cortex): 3 Total score (0-10 with 10 being normal): 10 CTA NECK FINDINGS SKELETON: There is no bony spinal canal stenosis. No lytic or blastic lesion. OTHER NECK: Subcentimeter right  intraparotid lymph node. Normal left parotid gland. 8 mm right submandibular lymph node. Submandibular glands are normal. UPPER CHEST: Right upper lobe consolidation. AORTIC ARCH: There is no calcific atherosclerosis of the aortic arch. There is no aneurysm, dissection or hemodynamically significant stenosis of the visualized portion of  the aorta. Conventional 3 vessel aortic branching pattern. The visualized proximal subclavian arteries are widely patent. RIGHT CAROTID SYSTEM: Normal without aneurysm, dissection or stenosis. LEFT CAROTID SYSTEM: Normal without aneurysm, dissection or stenosis. VERTEBRAL ARTERIES: Left dominant configuration. Both origins are clearly patent. There is no dissection, occlusion or flow-limiting stenosis to the skull base (V1-V3 segments). CTA HEAD FINDINGS POSTERIOR CIRCULATION: --Vertebral arteries: Normal V4 segments. --Inferior cerebellar arteries: Normal. --Basilar artery: Normal. --Superior cerebellar arteries: Normal. --Posterior cerebral arteries (PCA): Normal. ANTERIOR CIRCULATION: --Intracranial internal carotid arteries: Normal. --Anterior cerebral arteries (ACA): Normal. Both A1 segments are present. Patent anterior communicating artery (a-comm). --Middle cerebral arteries (MCA): Normal. VENOUS SINUSES: As permitted by contrast timing, patent. ANATOMIC VARIANTS: None Review of the MIP images confirms the above findings. IMPRESSION: 1. No emergent large vessel occlusion or high-grade stenosis of the head or neck. 2. Right upper lobe consolidation, concerning for pneumonia. These results were called by telephone at the time of interpretation on 02/12/2021 at 9:14 pm to provider [facility] , who verbally acknowledged these results. Electronically Signed   By: Ulyses Jarred M.D.   On: 02/12/2021 21:14

## 2021-02-17 NOTE — Progress Notes (Signed)
Physical Therapy Treatment Patient Details Name: Tyler [name] MRN: [id number] DOB: 1928/10/03 Today's Date: 02/17/2021   History of Present Illness 87 y.o. male with medical history significant for type 2 diabetes mellitus, essential hypertension, hyperlipidemia, stage IIIa chronic kidney disease with baseline creatinine reported 1.7, anemia, who was admitted with acute metabolic encephalopathy.    PT Comments    Patient received in bed, pleasant and cooperative with PT, eager to mobilize OOB. Able to get to EOB with one person assist, however then required 2 person assist for transfer with RW today. Very fatigued after transfer to chair, but was able to stand for about 3 minutes for pericare prior to sitting down. Left up in recliner with all needs met, chair alarm active. Will continue efforts, however at this point he would be best served by rehab in SNF setting prior to return home.     Recommendations for follow up therapy are one component of a multi-disciplinary discharge planning process, led by the attending physician.  Recommendations may be updated based on patient status, additional functional criteria and insurance authorization.  Follow Up Recommendations  SNF;Supervision/Assistance - 24 hour     Equipment Recommendations  None recommended by PT    Recommendations for Other Services       Precautions / Restrictions Precautions Precautions: Fall Restrictions Weight Bearing Restrictions: No     Mobility  Bed Mobility Overal bed mobility: Needs Assistance Bed Mobility: Supine to Sit     Supine to sit: Min assist;HOB elevated     General bed mobility comments: able to get to EOB with HOB elevated today, lots of encouragement and extra time; asked PT to do more for him than was necessary for transfer    Transfers Overall transfer level: Needs assistance Equipment used: Rolling walker (2 wheeled) Transfers: Sit to/from [facility] Sit to Stand:  Mod assist;+2 physical assistance Stand pivot transfers: Mod assist;+2 physical assistance       General transfer comment: still needed [place] x2 for balance/safety and steadying even with RW; very easily fatigued and poor safety awareness  Ambulation/Gait             General Gait Details: refused due to fatigue after transfers   Stairs             Wheelchair Mobility    Modified Rankin (Stroke Patients Only)       Balance Overall balance assessment: Needs assistance Sitting-balance support: Feet supported Sitting balance-Leahy Scale: Fair Sitting balance - Comments: posterior and R lean when not cued to remain in midline   Standing balance support: Bilateral upper extremity supported;During functional activity Standing balance-Leahy Scale: Poor Standing balance comment: MinAx1 for standing statically wtih BUE support, ModAx2 to pivot to recliner with BUE support today                            Cognition Arousal/Alertness: Awake/alert Behavior During Therapy: WFL for tasks assessed/performed Overall Cognitive Status: Impaired/Different from baseline Area of Impairment: Attention;Memory;Following commands;Problem solving;Safety/judgement                   Current Attention Level: Sustained Memory: Decreased short-term memory Following Commands: Follows one step commands with increased time;Follows one step commands consistently Safety/Judgement: Decreased awareness of deficits;Decreased awareness of safety   Problem Solving: Slow processing;Decreased initiation;Requires verbal cues;Requires tactile cues;Difficulty sequencing General Comments: awake and alert, but seemed a bit confused today vs impairments from [facility]; needed visual and  verbal cues for safe navigation in environment      Exercises      General Comments        Pertinent Vitals/Pain Pain Assessment: No/denies pain Faces Pain Scale: No hurt    Home Living                       Prior Function            PT Goals (current goals can now be found in the care plan section) Acute Rehab PT Goals Patient Stated Goal: to go home PT Goal Formulation: With patient/family Time For Goal Achievement: 02/20/21 Potential to Achieve Goals: Fair Progress towards PT goals: Progressing toward goals (slowly)    Frequency    Min 3X/week      PT Plan Discharge plan needs to be updated    Co-evaluation              AM-PAC PT "6 Clicks" Mobility   Outcome Measure  Help needed turning from your back to your side while in a flat bed without using bedrails?: A Little Help needed moving from lying on your back to sitting on the side of a flat bed without using bedrails?: A Lot Help needed moving to and from a bed to a chair (including a wheelchair)?: A Lot Help needed standing up from a chair using your arms (e.g., wheelchair or bedside chair)?: A Lot Help needed to walk in hospital room?: A Lot Help needed climbing 3-5 steps with a railing? : Total 6 Click Score: 12    End of Session Equipment Utilized During Treatment: Gait belt Activity Tolerance: Patient limited by fatigue Patient left: in chair;with call bell/phone within reach;with chair alarm set Nurse Communication: Mobility status PT Visit Diagnosis: Other abnormalities of gait and mobility (R26.89);Muscle weakness (generalized) (M62.81);Unsteadiness on feet (R26.81);Other symptoms and signs involving the nervous system (R29.898);Difficulty in walking, not elsewhere classified (R26.2)     Time: 8101-7510 PT Time Calculation (min) (ACUTE ONLY): 19 min  Charges:  $Therapeutic Activity: 8-22 mins                    Windell Norfolk, DPT, PN2   Supplemental Physical Therapist [place]    Pager [phone] Acute Rehab Office [phone]

## 2021-02-18 ENCOUNTER — Inpatient Hospital Stay (HOSPITAL_COMMUNITY): Payer: PPO

## 2021-02-18 LAB — GLUCOSE, CAPILLARY
Glucose-Capillary: 167 mg/dL — ABNORMAL HIGH (ref 70–99)
Glucose-Capillary: 177 mg/dL — ABNORMAL HIGH (ref 70–99)
Glucose-Capillary: 172 mg/dL — ABNORMAL HIGH (ref 70–99)
Glucose-Capillary: 186 mg/dL — ABNORMAL HIGH (ref 70–99)

## 2021-02-18 MED ORDER — HALOPERIDOL LACTATE 5 MG/ML IJ SOLN: 1.0000 mg | Freq: Four times a day (QID) | INTRAMUSCULAR | Status: DC | PRN

## 2021-02-18 MED ORDER — ENOXAPARIN SODIUM 40 MG/0.4ML IJ SOSY
40.0000 mg | PREFILLED_SYRINGE | INTRAMUSCULAR | Status: DC
  Administered 2021-02-18 – 2021-02-24 (×7): 40 mg via SUBCUTANEOUS
  Filled 2021-02-18 (×7): qty 0.4

## 2021-02-18 NOTE — TOC Progression Note (Signed)
Transition of Care [facility]) - Progression Note    Patient Details  Name: Tyler [name] MRN: [id number] Date of Birth: [birth year]  Transition of Care [facility] CM/SW [place], [place] Phone Number: 02/18/2021, 4:01 PM  Clinical Narrative:   CSW spoke with [employer] to initiate insurance authorization, awaiting authorization request. CSW continuing to attempt to get Miquel Dunn to review referral, unable to make contact with Miquel Dunn after multiple calls and texts. TOC Leadership made aware. CSW spoke with patient's son, Ashraf, to discuss back up options. Per [facility]ist is the number one choice, but CSW can also reach out to [employer]. CSW sent referral and asked [place] to review. Awaiting response.     Expected Discharge Plan: [place] Barriers to Discharge: Continued Medical Work up, Ship broker  Expected Discharge Plan and Services Expected Discharge Plan: [place] Acute Care Choice: NA Living arrangements for the past 2 months: Single Family Home Expected Discharge Date: 02/18/21                                     Social Determinants of Health (SDOH) Interventions    Readmission Risk Interventions No flowsheet data found.

## 2021-02-18 NOTE — Progress Notes (Addendum)
PROGRESS NOTE                                                                                                                                                                                                             Patient Demographics:    Tyler [name], is a 87 y.o. male, DOB - 20-Jun-1928, UVO:[id number]  Outpatient Primary MD for the patient is [name], MD    LOS - 7  Admit date - 02/11/2021    Chief Complaint  Patient presents with   Weakness       Brief Narrative (HPI from H&P)   Tyler [name] is a 87 y.o. male with medical history significant for type 2 diabetes mellitus, essential hypertension, hyperlipidemia, stage IIIa chronic kidney disease with baseline creatinine reported 1.7, anemia of chronic disease with baseline hemoglobin 9-12, who presented from home to [facility] ED for evaluation of altered mental status, had Asp. PNA.   Subjective:   Patient in bed, appears comfortable, denies any headache, no fever, no chest pain or pressure, no shortness of breath , no abdominal pain. No new focal weakness.   Assessment  & Plan :      Acute Toxic encephalopathy due to aspiration pneumonia and now with delirium - had detailed discussion with patient's son and life, currently has been having problems swallowing food or liquids for several weeks, he is post IVF and Unasyn, Speech following.  Head CT & MRI are unremarkable and he has no focal neurological deficits.  He does not have sepsis.  Will monitor closely, minimized benzodiazepines and narcotics, nighttime Seroquel and as needed Haldol, much better, await SNF bed.  Encouraged the patient to sit up in chair in the daytime use I-S and flutter valve for pulmonary toiletry.  Will advance activity and titrate down oxygen as possible.   2.  Weakness and deconditioning due to #1 above.  PT OT and monitor.  3.  Insignificant mild elevation in troponin patient ACS  pattern.  Chest pain-free.  EKG is nonacute, continue statin and Plavix for secondary prevention.  No further work-up.  Mild troponin rise likely underlying infection.  4.  Essential hypertension.  On Norvasc + Coreg.  5.  Dyslipidemia.  Statin.  6.  Mild AKI on CKD 3B.  Baseline creatinine around 1.5. Resolved post gentle hydration.  7.  GERD.  Placed on Pepcid.  8.  AOCD.  Stable no acute issue.  9.  History of CVA in the past.  No new focal deficits, head CT stable, no headache, continue Plavix and statin for secondary prevention.  10. Hypomagnesemia.  Replaced.    11. DM type II.  Sliding scale.  This SmartLink has not been configured with any valid records.   CBG (last 3)  Recent Labs    02/17/21 1608 02/17/21 2023 02/18/21 0611  GLUCAP 148* 246* 167*         Condition -  Guarded  Family Communication  :    Tyler [name] [phone] 02/12/21 at 8.54 am message left  Called son Tyler [name] [phone] + Wife 02/12/21 updated, son updated 02/14/20, 02/14/21, wife bedside 02/15/21, message left for Tyler [name] 02/16/21 at 9.31, updated 02/18/21  DW wife Tyler [name] [phone] 02/17/21    Code Status :  DNR  Consults  :  None  PUD Prophylaxis :    Procedures  :     MRI brain - Non acute  CT Head - 1. Stable chronic ischemic changes as above. No acute intracranial process  CT ABD  - Pelvis - No acute intra-abdominal pathology identified. No definite radiographic explanation for the patient's reported symptoms. Patchy infiltrate within the visualized right lung base, possibly infectious or inflammatory. Stable mild left nonobstructing nephrolithiasis. Indeterminate low-attenuation exophytic lesion arising from the upper pole of the right kidney, stable since prior examination of 01/22/2015. Its stability over time, however, favors a hyperdense renal cyst. Aortic Atherosclerosis (ICD10-I70.0).      Disposition Plan  :    Status is: Inpatient  Remains inpatient  appropriate because:IV treatments appropriate due to intensity of illness or inability to take PO  Dispo: The patient is from: Home              Anticipated d/c is to: SNF              Patient currently is not medically stable to d/c.   Difficult to place patient No  DVT Prophylaxis  :    enoxaparin (LOVENOX) injection 30 mg Start: 02/12/21 1600 SCDs Start: 02/11/21 2144  Lab Results  Component Value Date   PLT 164 02/16/2021    Diet :  Diet Order             Diet regular Room service appropriate? Yes with Assist; Fluid consistency: Thin  Diet effective now                    Inpatient Medications  Scheduled Meds:  amLODipine  10 mg Oral Daily   atorvastatin  40 mg Oral Daily   carvedilol  3.125 mg Oral BID WC   clopidogrel  75 mg Oral Q breakfast   docusate sodium  100 mg Oral QHS   enoxaparin (LOVENOX) injection  30 mg Subcutaneous Q24H   famotidine  20 mg Oral Daily   insulin aspart  0-6 Units Subcutaneous TID WC   QUEtiapine  12.5 mg Oral QHS   vitamin B-12  1,000 mcg Oral QHS   Continuous Infusions:   PRN Meds:.acetaminophen **OR** acetaminophen, hydrALAZINE, polyethylene glycol  Time Spent in minutes  30   Tyler [name] M.D on 02/18/2021 at 10:04 AM  To page go to [email or web address]   [facility] -  Office  [phone]  See all Orders from today for further details    Objective:   Vitals:   02/17/21 2024 02/17/21 2347 02/18/21 0417 02/18/21 0725  BP: (!) 158/69 (!) 168/61 (!) 182/73 (!) 159/59  Pulse: 74 64 75 71  Resp: 18 16 18 17   Temp: 98 F (36.7 C) 97.6 F (36.4 C) 97.8 F (36.6 C) 97.7 F (36.5 C)  TempSrc: Oral Axillary Oral Oral  SpO2: 97% 98% 96% 95%  Weight:      Height:        Wt Readings from Last 3 Encounters:  02/15/21 79.2 kg  10/12/18 70.3 kg  07/11/18 74.4 kg     Intake/Output Summary (Last 24 hours) at 02/18/2021 1004 Last data filed at 02/18/2021 0447 Gross per 24 hour  Intake 760 ml  Output 700 ml   Net 60 ml     Physical Exam  Awake mildly confused but calm, No new F.N deficits,   [place].AT,[name], [name],   Symmetrical Chest wall movement, Good air movement bilaterally, CTAB RRR,No Gallops, Rubs or new Murmurs,  +ve B.Sounds, Abd Soft, No tenderness,   No Cyanosis, Clubbing or edema,       Data Review:    CBC Recent Labs  Lab 02/12/21 0500 02/12/21 0508 02/13/21 0330 02/14/21 0309 02/15/21 0449 02/16/21 0354  WBC 15.5*  --  9.9 9.7 6.3 7.1  HGB 9.1* 9.2* 8.1* 8.7* 8.3* 8.5*  HCT 27.3* 27.0* 23.4* 25.4* 24.3* 25.7*  PLT 139*  --  127* 142* 156 164  MCV 98.9  --  95.9 96.2 97.2 97.3  MCH 33.0  --  33.2 33.0 33.2 32.2  MCHC 33.3  --  34.6 34.3 34.2 33.1  RDW 13.8  --  13.8 14.0 13.9 13.8  LYMPHSABS 1.4  --  0.8 1.4 0.9 1.0  MONOABS 0.9  --  0.6 0.6 0.5 0.6  EOSABS 0.0  --  0.0 0.2 0.3 0.3  BASOSABS 0.0  --  0.0 0.0 0.0 0.0    Recent Labs  Lab 02/11/21 1830 02/11/21 2019 02/12/21 0500 02/12/21 0508 02/12/21 0730 02/12/21 0911 02/13/21 0330 02/14/21 0309 02/15/21 0449 02/16/21 0354  NA 137  --  137 138  --   --  134* 136 137 139  K 4.2  --  4.1 4.1  --   --  3.7 3.7 4.0 3.7  CL 104  --  106  --   --   --  106 108 107 108  CO2 21*  --  21*  --   --   --  20* 20* 21* 21*  GLUCOSE 200*  --  127*  --   --   --  178* 144* 148* 143*  BUN 24*  --  25*  --   --   --  25* 20 24* 22  CREATININE 1.58*  --  1.51*  --   --   --  1.60* 1.64* 1.62* 1.57*  CALCIUM 9.7  --  9.1  --   --   --  8.9 9.1 8.9 9.0  AST 36  --  20  --   --   --  23 19 19 20   ALT 27  --  23  --   --   --  18 17 18 21   ALKPHOS 79  --  58  --   --   --  55 60 58 60  BILITOT 1.6*  --  1.1  --   --   --  0.9 0.8 0.7 1.0  ALBUMIN 4.0  --  3.2*  --   --   --  2.9* 3.0* 2.7* 2.9*  MG  --   --  1.4*  --   --   --  1.9 1.9 1.9 2.0  CRP  --   --   --   --   --   --  18.7* 17.9* 14.0* 10.4*  PROCALCITON  --   --   --   --  7.52  --  6.51 4.49 2.55 1.42  LATICACIDVEN 1.8 1.3  --   --   --    --   --   --   --   --   INR  --   --  1.2  --   --   --   --   --   --   --   TSH  --   --   --   --  2.050  --   --   --   --   --   HGBA1C  --   --   --   --   --  6.9*  --   --   --   --   BNP  --   --   --   --   --   --  489.5* 375.9* 165.9* 247.5*    ------------------------------------------------------------------------------------------------------------------ No results for input(s): CHOL, HDL, LDLCALC, TRIG, CHOLHDL, LDLDIRECT in the last 72 hours.  Lab Results  Component Value Date   HGBA1C 6.9 (H) 02/12/2021   ------------------------------------------------------------------------------------------------------------------ No results for input(s): TSH, T4TOTAL, T3FREE, THYROIDAB in the last 72 hours.  Invalid input(s): FREET3   Cardiac Enzymes No results for input(s): CKMB, TROPONINI, MYOGLOBIN in the last 168 hours.  Invalid input(s): CK ------------------------------------------------------------------------------------------------------------------    Component Value Date/Time   BNP 247.5 (H) 02/16/2021 0354     Radiology Reports CT HEAD WO CONTRAST (5MM)  Result Date: 02/11/2021 CLINICAL DATA:  Weakness, fatigue EXAM: CT HEAD WITHOUT CONTRAST TECHNIQUE: Contiguous axial images were obtained from the base of the skull through the vertex without intravenous contrast. COMPARISON:  10/18/2018 FINDINGS: Brain: Chronic small vessel ischemic changes are again seen throughout the periventricular white matter, stable. No signs of acute infarct or hemorrhage. Lateral ventricles and remaining midline structures are unremarkable. There are no acute extra-axial fluid collections. No mass effect. Vascular: No hyperdense vessel or unexpected calcification. Skull: Normal. Negative for fracture or focal lesion. Sinuses/Orbits: Mild mucosal thickening within the ethmoid air cells. Remaining paranasal sinuses are clear. Other: None. IMPRESSION: 1. Stable chronic ischemic changes  as above. No acute intracranial process. Electronically Signed   By: Randa Ngo M.D.   On: 02/11/2021 20:44   MR BRAIN WO CONTRAST  Result Date: 02/12/2021 CLINICAL DATA:  Acute neurologic deficit EXAM: MRI HEAD WITHOUT CONTRAST TECHNIQUE: Multiplanar, multiecho pulse sequences of the brain and surrounding structures were obtained without intravenous contrast. COMPARISON:  None. FINDINGS: Brain: No acute infarct, mass effect or extra-axial collection. No acute or chronic hemorrhage. There is multifocal hyperintense T2-weighted signal within the white matter. Diffuse, severe atrophy. The midline structures are normal. Vascular: Major flow voids are preserved. Skull and upper cervical spine: Normal calvarium and skull base. Visualized upper cervical spine and soft tissues are normal. Sinuses/Orbits:No paranasal sinus fluid levels or advanced mucosal thickening. No mastoid or middle ear effusion. Normal orbits. IMPRESSION: 1. No acute intracranial abnormality. 2. Diffuse, severe atrophy and findings of chronic microvascular ischemia. Electronically Signed   By: Ulyses Jarred M.D.   On: 02/12/2021 23:33   CT ABDOMEN PELVIS W CONTRAST  Result Date: 02/11/2021 CLINICAL DATA:  Nausea, vomiting, anorexia, oliguria. Abdominal pain and back pain. EXAM: CT ABDOMEN AND PELVIS WITH CONTRAST TECHNIQUE: Multidetector CT imaging of the abdomen and pelvis was performed using the standard protocol following bolus administration of intravenous contrast. CONTRAST:  43mL OMNIPAQUE IOHEXOL 350 MG/ML SOLN COMPARISON:  07/12/2020 FINDINGS: Lower chest: Patchy infiltrate is seen within the a posterior basal right lower lobe, possibly infectious or inflammatory in nature. Mild left basilar atelectasis. No pleural effusion. Cardiac size within normal limits. No pericardial effusion. Hepatobiliary: No focal liver abnormality is seen. Status post cholecystectomy. No biliary dilatation. Pancreas: Unremarkable Spleen: Unremarkable  Adrenals/Urinary Tract: The adrenal glands are unremarkable. The kidneys are normal in size and position. Stable 7 mm nonobstructing calculus within the lower pole the left kidney. No ureteral calculi. No hydronephrosis. Multiple simple bilateral cortical cysts are again identified. There is a stable 20 mm partially exophytic low-attenuation lesion arising from the upper pole of the right kidney which may represent a hyperdense renal cyst or cystic renal mass, but is not well characterized on this single-phase examination. This appears stable, however, since remote prior examination of 01/22/2015 and its stability over time favors a benign lesion. The bladder is unremarkable. Stomach/Bowel: Stomach is within normal limits. Appendix appears normal. No evidence of bowel wall thickening, distention, or inflammatory changes. Vascular/Lymphatic: Aortic atherosclerosis. No enlarged abdominal or pelvic lymph nodes. Reproductive: Prostatectomy and pelvic lymph node dissection has been performed. No recurrent mass lesion. Other: No abdominal wall hernia.  Rectum unremarkable. Musculoskeletal: Bilateral hip ORIF has been performed. Healed fractures the right superior and inferior pubic rami noted. Degenerative changes are seen within the lumbar spine. No acute bone abnormality. No lytic or blastic bone lesion. IMPRESSION: No acute intra-abdominal pathology identified. No definite radiographic explanation for the patient's reported symptoms. Patchy infiltrate within the visualized right lung base, possibly infectious or inflammatory. Stable mild left nonobstructing nephrolithiasis. Indeterminate low-attenuation exophytic lesion arising from the upper pole of the right kidney, stable since prior examination of 01/22/2015. Its stability over time, however, favors a hyperdense renal cyst. Aortic Atherosclerosis (ICD10-I70.0). Electronically Signed   By: Fidela Salisbury M.D.   On: 02/11/2021 20:55   DG Chest Port 1 View  Result  Date: 02/15/2021 CLINICAL DATA:  Shortness of breath EXAM: PORTABLE CHEST 1 VIEW COMPARISON:  Four days ago FINDINGS: Right upper lobe airspace disease. There is no edema, effusion, or pneumothorax. Normal heart size and stable mediastinal contours. IMPRESSION: Unchanged right upper lobe pneumonia. Electronically Signed   By: Jorje Guild M.D.   On: 02/15/2021 08:09   DG Chest Portable 1 View  Result Date: 02/11/2021 CLINICAL DATA:  Weakness, chest pain EXAM: PORTABLE CHEST 1 VIEW COMPARISON:  Chest x-ray 10/14/2018 FINDINGS: The heart and mediastinal contours are within normal limits. Vague right perihilar airspace density may be due to rotation. No pulmonary edema. No pleural effusion. No pneumothorax. No acute osseous abnormality. IMPRESSION: Low lung volumes with vague right perihilar airspace density may be due to rotation. Otherwise no acute cardiopulmonary abnormality. Electronically Signed   By: Iven Finn M.D.   On: 02/11/2021 19:15   CT HEAD CODE STROKE WO CONTRAST  Result Date: 02/12/2021 CLINICAL DATA:  Facial droop and dysarthria EXAM: CT HEAD WITHOUT CONTRAST CT ANGIOGRAPHY OF THE HEAD AND NECK TECHNIQUE: Contiguous axial images were obtained from the base of the skull through the vertex without intravenous contrast. Multidetector CT imaging of the head and neck was performed using the standard protocol during bolus administration of intravenous contrast. Multiplanar CT  image reconstructions and MIPs were obtained to evaluate the vascular anatomy. Carotid stenosis measurements (when applicable) are obtained utilizing NASCET criteria, using the distal internal carotid diameter as the denominator. CONTRAST:  7mL OMNIPAQUE IOHEXOL 350 MG/ML SOLN COMPARISON:  None. FINDINGS: CT HEAD FINDINGS Brain: There is no mass, hemorrhage or extra-axial collection. There is generalized atrophy without lobar predilection. There is hypoattenuation of the periventricular white matter, most commonly  indicating chronic ischemic microangiopathy. Vascular: No abnormal hyperdensity of the major intracranial arteries or dural venous sinuses. No intracranial atherosclerosis. Skull: The visualized skull base, calvarium and extracranial soft tissues are normal. Sinuses/Orbits: No fluid levels or advanced mucosal thickening of the visualized paranasal sinuses. No mastoid or middle ear effusion. The orbits are normal. ASPECTS ([place] Stroke Program Early CT Score) - Ganglionic level infarction (caudate, lentiform nuclei, internal capsule, insula, M1-M3 cortex): 7 - Supraganglionic infarction (M4-M6 cortex): 3 Total score (0-10 with 10 being normal): 10 CTA NECK FINDINGS SKELETON: There is no bony spinal canal stenosis. No lytic or blastic lesion. OTHER NECK: Subcentimeter right intraparotid lymph node. Normal left parotid gland. 8 mm right submandibular lymph node. Submandibular glands are normal. UPPER CHEST: Right upper lobe consolidation. AORTIC ARCH: There is no calcific atherosclerosis of the aortic arch. There is no aneurysm, dissection or hemodynamically significant stenosis of the visualized portion of the aorta. Conventional 3 vessel aortic branching pattern. The visualized proximal subclavian arteries are widely patent. RIGHT CAROTID SYSTEM: Normal without aneurysm, dissection or stenosis. LEFT CAROTID SYSTEM: Normal without aneurysm, dissection or stenosis. VERTEBRAL ARTERIES: Left dominant configuration. Both origins are clearly patent. There is no dissection, occlusion or flow-limiting stenosis to the skull base (V1-V3 segments). CTA HEAD FINDINGS POSTERIOR CIRCULATION: --Vertebral arteries: Normal V4 segments. --Inferior cerebellar arteries: Normal. --Basilar artery: Normal. --Superior cerebellar arteries: Normal. --Posterior cerebral arteries (PCA): Normal. ANTERIOR CIRCULATION: --Intracranial internal carotid arteries: Normal. --Anterior cerebral arteries (ACA): Normal. Both A1 segments are present.  Patent anterior communicating artery (a-comm). --Middle cerebral arteries (MCA): Normal. VENOUS SINUSES: As permitted by contrast timing, patent. ANATOMIC VARIANTS: None Review of the MIP images confirms the above findings. IMPRESSION: 1. No emergent large vessel occlusion or high-grade stenosis of the head or neck. 2. Right upper lobe consolidation, concerning for pneumonia. These results were called by telephone at the time of interpretation on 02/12/2021 at 9:14 pm to provider [facility] , who verbally acknowledged these results. Electronically Signed   By: Ulyses Jarred M.D.   On: 02/12/2021 21:14   CT ANGIO HEAD CODE STROKE  Result Date: 02/12/2021 CLINICAL DATA:  Facial droop and dysarthria EXAM: CT HEAD WITHOUT CONTRAST CT ANGIOGRAPHY OF THE HEAD AND NECK TECHNIQUE: Contiguous axial images were obtained from the base of the skull through the vertex without intravenous contrast. Multidetector CT imaging of the head and neck was performed using the standard protocol during bolus administration of intravenous contrast. Multiplanar CT image reconstructions and MIPs were obtained to evaluate the vascular anatomy. Carotid stenosis measurements (when applicable) are obtained utilizing NASCET criteria, using the distal internal carotid diameter as the denominator. CONTRAST:  62mL OMNIPAQUE IOHEXOL 350 MG/ML SOLN COMPARISON:  None. FINDINGS: CT HEAD FINDINGS Brain: There is no mass, hemorrhage or extra-axial collection. There is generalized atrophy without lobar predilection. There is hypoattenuation of the periventricular white matter, most commonly indicating chronic ischemic microangiopathy. Vascular: No abnormal hyperdensity of the major intracranial arteries or dural venous sinuses. No intracranial atherosclerosis. Skull: The visualized skull base, calvarium and extracranial soft tissues are normal. Sinuses/Orbits: No fluid  levels or advanced mucosal thickening of the visualized paranasal sinuses. No  mastoid or middle ear effusion. The orbits are normal. ASPECTS ([place] Stroke Program Early CT Score) - Ganglionic level infarction (caudate, lentiform nuclei, internal capsule, insula, M1-M3 cortex): 7 - Supraganglionic infarction (M4-M6 cortex): 3 Total score (0-10 with 10 being normal): 10 CTA NECK FINDINGS SKELETON: There is no bony spinal canal stenosis. No lytic or blastic lesion. OTHER NECK: Subcentimeter right intraparotid lymph node. Normal left parotid gland. 8 mm right submandibular lymph node. Submandibular glands are normal. UPPER CHEST: Right upper lobe consolidation. AORTIC ARCH: There is no calcific atherosclerosis of the aortic arch. There is no aneurysm, dissection or hemodynamically significant stenosis of the visualized portion of the aorta. Conventional 3 vessel aortic branching pattern. The visualized proximal subclavian arteries are widely patent. RIGHT CAROTID SYSTEM: Normal without aneurysm, dissection or stenosis. LEFT CAROTID SYSTEM: Normal without aneurysm, dissection or stenosis. VERTEBRAL ARTERIES: Left dominant configuration. Both origins are clearly patent. There is no dissection, occlusion or flow-limiting stenosis to the skull base (V1-V3 segments). CTA HEAD FINDINGS POSTERIOR CIRCULATION: --Vertebral arteries: Normal V4 segments. --Inferior cerebellar arteries: Normal. --Basilar artery: Normal. --Superior cerebellar arteries: Normal. --Posterior cerebral arteries (PCA): Normal. ANTERIOR CIRCULATION: --Intracranial internal carotid arteries: Normal. --Anterior cerebral arteries (ACA): Normal. Both A1 segments are present. Patent anterior communicating artery (a-comm). --Middle cerebral arteries (MCA): Normal. VENOUS SINUSES: As permitted by contrast timing, patent. ANATOMIC VARIANTS: None Review of the MIP images confirms the above findings. IMPRESSION: 1. No emergent large vessel occlusion or high-grade stenosis of the head or neck. 2. Right upper lobe consolidation, concerning  for pneumonia. These results were called by telephone at the time of interpretation on 02/12/2021 at 9:14 pm to provider [facility] , who verbally acknowledged these results. Electronically Signed   By: Ulyses Jarred M.D.   On: 02/12/2021 21:14   CT ANGIO NECK CODE STROKE  Result Date: 02/12/2021 CLINICAL DATA:  Facial droop and dysarthria EXAM: CT HEAD WITHOUT CONTRAST CT ANGIOGRAPHY OF THE HEAD AND NECK TECHNIQUE: Contiguous axial images were obtained from the base of the skull through the vertex without intravenous contrast. Multidetector CT imaging of the head and neck was performed using the standard protocol during bolus administration of intravenous contrast. Multiplanar CT image reconstructions and MIPs were obtained to evaluate the vascular anatomy. Carotid stenosis measurements (when applicable) are obtained utilizing NASCET criteria, using the distal internal carotid diameter as the denominator. CONTRAST:  83mL OMNIPAQUE IOHEXOL 350 MG/ML SOLN COMPARISON:  None. FINDINGS: CT HEAD FINDINGS Brain: There is no mass, hemorrhage or extra-axial collection. There is generalized atrophy without lobar predilection. There is hypoattenuation of the periventricular white matter, most commonly indicating chronic ischemic microangiopathy. Vascular: No abnormal hyperdensity of the major intracranial arteries or dural venous sinuses. No intracranial atherosclerosis. Skull: The visualized skull base, calvarium and extracranial soft tissues are normal. Sinuses/Orbits: No fluid levels or advanced mucosal thickening of the visualized paranasal sinuses. No mastoid or middle ear effusion. The orbits are normal. ASPECTS ([place] Stroke Program Early CT Score) - Ganglionic level infarction (caudate, lentiform nuclei, internal capsule, insula, M1-M3 cortex): 7 - Supraganglionic infarction (M4-M6 cortex): 3 Total score (0-10 with 10 being normal): 10 CTA NECK FINDINGS SKELETON: There is no bony spinal canal stenosis.  No lytic or blastic lesion. OTHER NECK: Subcentimeter right intraparotid lymph node. Normal left parotid gland. 8 mm right submandibular lymph node. Submandibular glands are normal. UPPER CHEST: Right upper lobe consolidation. AORTIC ARCH: There is no calcific atherosclerosis of  the aortic arch. There is no aneurysm, dissection or hemodynamically significant stenosis of the visualized portion of the aorta. Conventional 3 vessel aortic branching pattern. The visualized proximal subclavian arteries are widely patent. RIGHT CAROTID SYSTEM: Normal without aneurysm, dissection or stenosis. LEFT CAROTID SYSTEM: Normal without aneurysm, dissection or stenosis. VERTEBRAL ARTERIES: Left dominant configuration. Both origins are clearly patent. There is no dissection, occlusion or flow-limiting stenosis to the skull base (V1-V3 segments). CTA HEAD FINDINGS POSTERIOR CIRCULATION: --Vertebral arteries: Normal V4 segments. --Inferior cerebellar arteries: Normal. --Basilar artery: Normal. --Superior cerebellar arteries: Normal. --Posterior cerebral arteries (PCA): Normal. ANTERIOR CIRCULATION: --Intracranial internal carotid arteries: Normal. --Anterior cerebral arteries (ACA): Normal. Both A1 segments are present. Patent anterior communicating artery (a-comm). --Middle cerebral arteries (MCA): Normal. VENOUS SINUSES: As permitted by contrast timing, patent. ANATOMIC VARIANTS: None Review of the MIP images confirms the above findings. IMPRESSION: 1. No emergent large vessel occlusion or high-grade stenosis of the head or neck. 2. Right upper lobe consolidation, concerning for pneumonia. These results were called by telephone at the time of interpretation on 02/12/2021 at 9:14 pm to provider [facility] , who verbally acknowledged these results. Electronically Signed   By: Ulyses Jarred M.D.   On: 02/12/2021 21:14

## 2021-02-19 DIAGNOSIS — Z7189 Other specified counseling: Secondary | ICD-10-CM

## 2021-02-19 LAB — GLUCOSE, CAPILLARY
Glucose-Capillary: 131 mg/dL — ABNORMAL HIGH (ref 70–99)
Glucose-Capillary: 167 mg/dL — ABNORMAL HIGH (ref 70–99)
Glucose-Capillary: 184 mg/dL — ABNORMAL HIGH (ref 70–99)
Glucose-Capillary: 303 mg/dL — ABNORMAL HIGH (ref 70–99)

## 2021-02-19 MED ORDER — QUETIAPINE FUMARATE 25 MG PO TABS: 12.5000 mg | ORAL_TABLET | Freq: Every day | ORAL | Status: DC

## 2021-02-19 MED ORDER — QUETIAPINE FUMARATE 25 MG PO TABS
12.5000 mg | ORAL_TABLET | Freq: Every day | ORAL | Status: DC
  Administered 2021-02-19 – 2021-02-21 (×3): 12.5 mg via ORAL
  Filled 2021-02-19 (×3): qty 1

## 2021-02-19 NOTE — Progress Notes (Signed)
PROGRESS NOTE                                                                                                                                                                                                             Patient Demographics:    Tyler [name], is a 87 y.o. male, DOB - 17-Jun-1928, QGB:[id number]  Outpatient Primary MD for the patient is Tyler Pretty, MD    LOS - 8  Admit date - 02/11/2021    Chief Complaint  Patient presents with   Weakness       Brief Narrative (HPI from H&P)    Tyler [name] is a 87 y.o. male with medical history significant for type 2 diabetes mellitus, essential hypertension, hyperlipidemia, stage IIIa chronic kidney disease with baseline creatinine reported 1.7, anemia of chronic disease with baseline hemoglobin 9-12, who presented from home to [facility] ED for evaluation of altered mental status, had Asp. PNA.   Subjective:   No significant events overnight as discussed with staff, morning patient was more sleepy, but later in the day he is more awake, had a good lunch, and is going to work with PT this afternoon.     Assessment  & Plan :     Acute Toxic encephalopathy due to aspiration pneumonia and now with delirium  - had detailed discussion with patient's son and life, currently has been having problems swallowing food or liquids for several weeks, post IVF and Unasyn, Speech following.  Head CT & MRI are unremarkable and he has no focal neurological deficits.  He does not have sepsis.  Will monitor closely, minimized benzodiazepines and narcotics, nighttime Seroquel and as needed Haldol, much better, await bed.  Encouraged the patient to sit up in chair in the daytime use I-S and flutter valve for pulmonary toiletry.  Will advance activity and titrate down oxygen as possible.  This morning patient appears to be lethargic, but he woke up later in the day, I will give him his Eliquis earlier  at 8 PM, so he is more awake and appropriate earlier in the morning.   2. Weakness and deconditioning due to #1 above.  PT OT and monitor.  3.  Insignificant mild elevation in troponin patient ACS pattern.  Chest pain-free.  EKG is nonacute, continue statin and Plavix for secondary prevention.  No further work-up.  Mild troponin rise likely underlying infection.  4.  Essential hypertension.  On Norvasc + Coreg.  5.  Dyslipidemia.  Statin.  6.  CKD 3B.  Baseline creatinine around 1.5.  Stable.  Monitor with gentle hydration.  7.  GERD.  Placed on Pepcid.  8.  AOCD.  Stable no acute issue.  9.  History of CVA in the past.  No new focal deficits, head CT stable, no headache, continue Plavix and statin for secondary prevention.  10. Hypomagnesemia.  Replaced.    11. DM type II.  Sliding scale.  This SmartLink has not been configured with any valid records.   CBG (last 3)  Recent Labs    02/18/21 2131 02/19/21 0629 02/19/21 1128  GLUCAP 186* 131* 167*         Condition -  Guarded  Family Communication  :   Discussed  with wife at bedside.   Code Status :  DNR  Consults  :  None  PUD Prophylaxis :    Procedures  :     MRI brain - Non acute  CT Head - 1. Stable chronic ischemic changes as above. No acute intracranial process  CT ABD  - Pelvis - No acute intra-abdominal pathology identified. No definite radiographic explanation for the patient's reported symptoms. Patchy infiltrate within the visualized right lung base, possibly infectious or inflammatory. Stable mild left nonobstructing nephrolithiasis. Indeterminate low-attenuation exophytic lesion arising from the upper pole of the right kidney, stable since prior examination of 01/22/2015. Its stability over time, however, favors a hyperdense renal cyst. Aortic Atherosclerosis (ICD10-I70.0).      Disposition Plan  :    Status is: Inpatient  Remains inpatient appropriate because:IV treatments appropriate due  to intensity of illness or inability to take PO  Dispo: The patient is from: Home              Anticipated d/c is to: SNF              Patient currently is not medically stable to d/c.   Difficult to place patient No  DVT Prophylaxis  :    enoxaparin (LOVENOX) injection 40 mg Start: 02/18/21 1600 SCDs Start: 02/11/21 2144  Lab Results  Component Value Date   PLT 164 02/16/2021    Diet :  Diet Order             Diet regular Room service appropriate? Yes with Assist; Fluid consistency: Thin  Diet effective now                    Inpatient Medications  Scheduled Meds:  amLODipine  10 mg Oral Daily   atorvastatin  40 mg Oral Daily   carvedilol  3.125 mg Oral BID WC   clopidogrel  75 mg Oral Q breakfast   docusate sodium  100 mg Oral QHS   enoxaparin (LOVENOX) injection  40 mg Subcutaneous Q24H   famotidine  20 mg Oral Daily   insulin aspart  0-6 Units Subcutaneous TID WC   QUEtiapine  12.5 mg Oral QHS   vitamin B-12  1,000 mcg Oral QHS   Continuous Infusions:   PRN Meds:.acetaminophen **OR** acetaminophen, haloperidol lactate, hydrALAZINE, polyethylene glycol  Time Spent in minutes  30   [name] M.D on 02/19/2021 at 2:04 PM  To page go to [email or web address]   [facility] -  Office  [phone]  See all Orders from today for further details    Objective:   Vitals:  02/18/21 2327 02/19/21 0330 02/19/21 0831 02/19/21 1111  BP: (!) 152/57 (!) 146/52 (!) 148/55 (!) 146/52  Pulse:  (!) 57 70 76  Resp: (!) 21 17 18 17   Temp: 98.1 F (36.7 C) 98.3 F (36.8 C) 98.1 F (36.7 C) 99.1 F (37.3 C)  TempSrc: Axillary Axillary Axillary Axillary  SpO2: 98% 95% 96% 95%  Weight:      Height:        Wt Readings from Last 3 Encounters:  02/15/21 79.2 kg  10/12/18 70.3 kg  07/11/18 74.4 kg     Intake/Output Summary (Last 24 hours) at 02/19/2021 1404 Last data filed at 02/19/2021 1116 Gross per 24 hour  Intake 300 ml  Output 1100 ml  Net  -800 ml     Physical Exam  Patient lethargic this morning, sleepy, open eyes ( this has improved later during the day when he is more interactive and communicative) Symmetrical Chest wall movement, Good air movement bilaterally, CTAB RRR,No Gallops,Rubs or new Murmurs, No Parasternal Heave +ve B.Sounds, Abd Soft, No tenderness, No rebound - guarding or rigidity. No Cyanosis, Clubbing or edema, No new Rash or bruise      Data Review:    CBC Recent Labs  Lab 02/13/21 0330 02/14/21 0309 02/15/21 0449 02/16/21 0354  WBC 9.9 9.7 6.3 7.1  HGB 8.1* 8.7* 8.3* 8.5*  HCT 23.4* 25.4* 24.3* 25.7*  PLT 127* 142* 156 164  MCV 95.9 96.2 97.2 97.3  MCH 33.2 33.0 33.2 32.2  MCHC 34.6 34.3 34.2 33.1  RDW 13.8 14.0 13.9 13.8  LYMPHSABS 0.8 1.4 0.9 1.0  MONOABS 0.6 0.6 0.5 0.6  EOSABS 0.0 0.2 0.3 0.3  BASOSABS 0.0 0.0 0.0 0.0    Recent Labs  Lab 02/13/21 0330 02/14/21 0309 02/15/21 0449 02/16/21 0354  NA 134* 136 137 139  K 3.7 3.7 4.0 3.7  CL 106 108 107 108  CO2 20* 20* 21* 21*  GLUCOSE 178* 144* 148* 143*  BUN 25* 20 24* 22  CREATININE 1.60* 1.64* 1.62* 1.57*  CALCIUM 8.9 9.1 8.9 9.0  AST 23 19 19 20   ALT 18 17 18 21   ALKPHOS 55 60 58 60  BILITOT 0.9 0.8 0.7 1.0  ALBUMIN 2.9* 3.0* 2.7* 2.9*  MG 1.9 1.9 1.9 2.0  CRP 18.7* 17.9* 14.0* 10.4*  PROCALCITON 6.51 4.49 2.55 1.42  BNP 489.5* 375.9* 165.9* 247.5*    ------------------------------------------------------------------------------------------------------------------ No results for input(s): CHOL, HDL, LDLCALC, TRIG, CHOLHDL, LDLDIRECT in the last 72 hours.  Lab Results  Component Value Date   HGBA1C 6.9 (H) 02/12/2021   ------------------------------------------------------------------------------------------------------------------ No results for input(s): TSH, T4TOTAL, T3FREE, THYROIDAB in the last 72 hours.  Invalid input(s): FREET3   Cardiac Enzymes No results for input(s): CKMB, TROPONINI, MYOGLOBIN  in the last 168 hours.  Invalid input(s): CK ------------------------------------------------------------------------------------------------------------------    Component Value Date/Time   BNP 247.5 (H) 02/16/2021 0354     Radiology Reports CT HEAD WO CONTRAST (5MM)  Result Date: 02/11/2021 CLINICAL DATA:  Weakness, fatigue EXAM: CT HEAD WITHOUT CONTRAST TECHNIQUE: Contiguous axial images were obtained from the base of the skull through the vertex without intravenous contrast. COMPARISON:  10/18/2018 FINDINGS: Brain: Chronic small vessel ischemic changes are again seen throughout the periventricular white matter, stable. No signs of acute infarct or hemorrhage. Lateral ventricles and remaining midline structures are unremarkable. There are no acute extra-axial fluid collections. No mass effect. Vascular: No hyperdense vessel or unexpected calcification. Skull: Normal. Negative for fracture or focal lesion. Sinuses/Orbits:  Mild mucosal thickening within the ethmoid air cells. Remaining paranasal sinuses are clear. Other: None. IMPRESSION: 1. Stable chronic ischemic changes as above. No acute intracranial process. Electronically Signed   By: Randa Ngo M.D.   On: 02/11/2021 20:44   MR BRAIN WO CONTRAST  Result Date: 02/12/2021 CLINICAL DATA:  Acute neurologic deficit EXAM: MRI HEAD WITHOUT CONTRAST TECHNIQUE: Multiplanar, multiecho pulse sequences of the brain and surrounding structures were obtained without intravenous contrast. COMPARISON:  None. FINDINGS: Brain: No acute infarct, mass effect or extra-axial collection. No acute or chronic hemorrhage. There is multifocal hyperintense T2-weighted signal within the white matter. Diffuse, severe atrophy. The midline structures are normal. Vascular: Major flow voids are preserved. Skull and upper cervical spine: Normal calvarium and skull base. Visualized upper cervical spine and soft tissues are normal. Sinuses/Orbits:No paranasal sinus fluid  levels or advanced mucosal thickening. No mastoid or middle ear effusion. Normal orbits. IMPRESSION: 1. No acute intracranial abnormality. 2. Diffuse, severe atrophy and findings of chronic microvascular ischemia. Electronically Signed   By: Ulyses Jarred M.D.   On: 02/12/2021 23:33   CT ABDOMEN PELVIS W CONTRAST  Result Date: 02/11/2021 CLINICAL DATA:  Nausea, vomiting, anorexia, oliguria. Abdominal pain and back pain. EXAM: CT ABDOMEN AND PELVIS WITH CONTRAST TECHNIQUE: Multidetector CT imaging of the abdomen and pelvis was performed using the standard protocol following bolus administration of intravenous contrast. CONTRAST:  48mL OMNIPAQUE IOHEXOL 350 MG/ML SOLN COMPARISON:  07/12/2020 FINDINGS: Lower chest: Patchy infiltrate is seen within the a posterior basal right lower lobe, possibly infectious or inflammatory in nature. Mild left basilar atelectasis. No pleural effusion. Cardiac size within normal limits. No pericardial effusion. Hepatobiliary: No focal liver abnormality is seen. Status post cholecystectomy. No biliary dilatation. Pancreas: Unremarkable Spleen: Unremarkable Adrenals/Urinary Tract: The adrenal glands are unremarkable. The kidneys are normal in size and position. Stable 7 mm nonobstructing calculus within the lower pole the left kidney. No ureteral calculi. No hydronephrosis. Multiple simple bilateral cortical cysts are again identified. There is a stable 20 mm partially exophytic low-attenuation lesion arising from the upper pole of the right kidney which may represent a hyperdense renal cyst or cystic renal mass, but is not well characterized on this single-phase examination. This appears stable, however, since remote prior examination of 01/22/2015 and its stability over time favors a benign lesion. The bladder is unremarkable. Stomach/Bowel: Stomach is within normal limits. Appendix appears normal. No evidence of bowel wall thickening, distention, or inflammatory changes.  Vascular/Lymphatic: Aortic atherosclerosis. No enlarged abdominal or pelvic lymph nodes. Reproductive: Prostatectomy and pelvic lymph node dissection has been performed. No recurrent mass lesion. Other: No abdominal wall hernia.  Rectum unremarkable. Musculoskeletal: Bilateral hip ORIF has been performed. Healed fractures the right superior and inferior pubic rami noted. Degenerative changes are seen within the lumbar spine. No acute bone abnormality. No lytic or blastic bone lesion. IMPRESSION: No acute intra-abdominal pathology identified. No definite radiographic explanation for the patient's reported symptoms. Patchy infiltrate within the visualized right lung base, possibly infectious or inflammatory. Stable mild left nonobstructing nephrolithiasis. Indeterminate low-attenuation exophytic lesion arising from the upper pole of the right kidney, stable since prior examination of 01/22/2015. Its stability over time, however, favors a hyperdense renal cyst. Aortic Atherosclerosis (ICD10-I70.0). Electronically Signed   By: Fidela Salisbury M.D.   On: 02/11/2021 20:55   DG Chest Port 1 View  Result Date: 02/18/2021 CLINICAL DATA:  87 year old male with shortness of breath. Right upper lobe pneumonia. EXAM: PORTABLE CHEST 1 VIEW COMPARISON:  Portable chest 02/15/2021 and earlier. FINDINGS: Portable AP upright view at 1109 hours. Partial clearing of the asymmetric right upper lobe opacity which continues to abut the minor fissure. Mildly lower lung volumes. Mediastinal contours remain normal. Visualized tracheal air column is within normal limits. No pneumothorax, pulmonary edema, pleural effusion or new pulmonary opacity. Visible bowel-gas pattern within normal limits. Cholecystectomy clips. Osteopenia. No acute osseous abnormality identified. IMPRESSION: Partial clearing of the right upper lobe pneumonia. No new cardiopulmonary abnormality. Electronically Signed   By: Genevie Ann M.D.   On: 02/18/2021 11:35   DG  Chest Port 1 View  Result Date: 02/15/2021 CLINICAL DATA:  Shortness of breath EXAM: PORTABLE CHEST 1 VIEW COMPARISON:  Four days ago FINDINGS: Right upper lobe airspace disease. There is no edema, effusion, or pneumothorax. Normal heart size and stable mediastinal contours. IMPRESSION: Unchanged right upper lobe pneumonia. Electronically Signed   By: Jorje Guild M.D.   On: 02/15/2021 08:09   DG Chest Portable 1 View  Result Date: 02/11/2021 CLINICAL DATA:  Weakness, chest pain EXAM: PORTABLE CHEST 1 VIEW COMPARISON:  Chest x-ray 10/14/2018 FINDINGS: The heart and mediastinal contours are within normal limits. Vague right perihilar airspace density may be due to rotation. No pulmonary edema. No pleural effusion. No pneumothorax. No acute osseous abnormality. IMPRESSION: Low lung volumes with vague right perihilar airspace density may be due to rotation. Otherwise no acute cardiopulmonary abnormality. Electronically Signed   By: Iven Finn M.D.   On: 02/11/2021 19:15   CT HEAD CODE STROKE WO CONTRAST  Result Date: 02/12/2021 CLINICAL DATA:  Facial droop and dysarthria EXAM: CT HEAD WITHOUT CONTRAST CT ANGIOGRAPHY OF THE HEAD AND NECK TECHNIQUE: Contiguous axial images were obtained from the base of the skull through the vertex without intravenous contrast. Multidetector CT imaging of the head and neck was performed using the standard protocol during bolus administration of intravenous contrast. Multiplanar CT image reconstructions and MIPs were obtained to evaluate the vascular anatomy. Carotid stenosis measurements (when applicable) are obtained utilizing NASCET criteria, using the distal internal carotid diameter as the denominator. CONTRAST:  46mL OMNIPAQUE IOHEXOL 350 MG/ML SOLN COMPARISON:  None. FINDINGS: CT HEAD FINDINGS Brain: There is no mass, hemorrhage or extra-axial collection. There is generalized atrophy without lobar predilection. There is hypoattenuation of the periventricular  white matter, most commonly indicating chronic ischemic microangiopathy. Vascular: No abnormal hyperdensity of the major intracranial arteries or dural venous sinuses. No intracranial atherosclerosis. Skull: The visualized skull base, calvarium and extracranial soft tissues are normal. Sinuses/Orbits: No fluid levels or advanced mucosal thickening of the visualized paranasal sinuses. No mastoid or middle ear effusion. The orbits are normal. ASPECTS ([place] Stroke Program Early CT Score) - Ganglionic level infarction (caudate, lentiform nuclei, internal capsule, insula, M1-M3 cortex): 7 - Supraganglionic infarction (M4-M6 cortex): 3 Total score (0-10 with 10 being normal): 10 CTA NECK FINDINGS SKELETON: There is no bony spinal canal stenosis. No lytic or blastic lesion. OTHER NECK: Subcentimeter right intraparotid lymph node. Normal left parotid gland. 8 mm right submandibular lymph node. Submandibular glands are normal. UPPER CHEST: Right upper lobe consolidation. AORTIC ARCH: There is no calcific atherosclerosis of the aortic arch. There is no aneurysm, dissection or hemodynamically significant stenosis of the visualized portion of the aorta. Conventional 3 vessel aortic branching pattern. The visualized proximal subclavian arteries are widely patent. RIGHT CAROTID SYSTEM: Normal without aneurysm, dissection or stenosis. LEFT CAROTID SYSTEM: Normal without aneurysm, dissection or stenosis. VERTEBRAL ARTERIES: Left dominant configuration. Both origins are clearly  patent. There is no dissection, occlusion or flow-limiting stenosis to the skull base (V1-V3 segments). CTA HEAD FINDINGS POSTERIOR CIRCULATION: --Vertebral arteries: Normal V4 segments. --Inferior cerebellar arteries: Normal. --Basilar artery: Normal. --Superior cerebellar arteries: Normal. --Posterior cerebral arteries (PCA): Normal. ANTERIOR CIRCULATION: --Intracranial internal carotid arteries: Normal. --Anterior cerebral arteries (ACA): Normal. Both  A1 segments are present. Patent anterior communicating artery (a-comm). --Middle cerebral arteries (MCA): Normal. VENOUS SINUSES: As permitted by contrast timing, patent. ANATOMIC VARIANTS: None Review of the MIP images confirms the above findings. IMPRESSION: 1. No emergent large vessel occlusion or high-grade stenosis of the head or neck. 2. Right upper lobe consolidation, concerning for pneumonia. These results were called by telephone at the time of interpretation on 02/12/2021 at 9:14 pm to provider [place] , who verbally acknowledged these results. Electronically Signed   By: Ulyses Jarred M.D.   On: 02/12/2021 21:14   CT ANGIO HEAD CODE STROKE  Result Date: 02/12/2021 CLINICAL DATA:  Facial droop and dysarthria EXAM: CT HEAD WITHOUT CONTRAST CT ANGIOGRAPHY OF THE HEAD AND NECK TECHNIQUE: Contiguous axial images were obtained from the base of the skull through the vertex without intravenous contrast. Multidetector CT imaging of the head and neck was performed using the standard protocol during bolus administration of intravenous contrast. Multiplanar CT image reconstructions and MIPs were obtained to evaluate the vascular anatomy. Carotid stenosis measurements (when applicable) are obtained utilizing NASCET criteria, using the distal internal carotid diameter as the denominator. CONTRAST:  49mL OMNIPAQUE IOHEXOL 350 MG/ML SOLN COMPARISON:  None. FINDINGS: CT HEAD FINDINGS Brain: There is no mass, hemorrhage or extra-axial collection. There is generalized atrophy without lobar predilection. There is hypoattenuation of the periventricular white matter, most commonly indicating chronic ischemic microangiopathy. Vascular: No abnormal hyperdensity of the major intracranial arteries or dural venous sinuses. No intracranial atherosclerosis. Skull: The visualized skull base, calvarium and extracranial soft tissues are normal. Sinuses/Orbits: No fluid levels or advanced mucosal thickening of the  visualized paranasal sinuses. No mastoid or middle ear effusion. The orbits are normal. ASPECTS ([place] Stroke Program Early CT Score) - Ganglionic level infarction (caudate, lentiform nuclei, internal capsule, insula, M1-M3 cortex): 7 - Supraganglionic infarction (M4-M6 cortex): 3 Total score (0-10 with 10 being normal): 10 CTA NECK FINDINGS SKELETON: There is no bony spinal canal stenosis. No lytic or blastic lesion. OTHER NECK: Subcentimeter right intraparotid lymph node. Normal left parotid gland. 8 mm right submandibular lymph node. Submandibular glands are normal. UPPER CHEST: Right upper lobe consolidation. AORTIC ARCH: There is no calcific atherosclerosis of the aortic arch. There is no aneurysm, dissection or hemodynamically significant stenosis of the visualized portion of the aorta. Conventional 3 vessel aortic branching pattern. The visualized proximal subclavian arteries are widely patent. RIGHT CAROTID SYSTEM: Normal without aneurysm, dissection or stenosis. LEFT CAROTID SYSTEM: Normal without aneurysm, dissection or stenosis. VERTEBRAL ARTERIES: Left dominant configuration. Both origins are clearly patent. There is no dissection, occlusion or flow-limiting stenosis to the skull base (V1-V3 segments). CTA HEAD FINDINGS POSTERIOR CIRCULATION: --Vertebral arteries: Normal V4 segments. --Inferior cerebellar arteries: Normal. --Basilar artery: Normal. --Superior cerebellar arteries: Normal. --Posterior cerebral arteries (PCA): Normal. ANTERIOR CIRCULATION: --Intracranial internal carotid arteries: Normal. --Anterior cerebral arteries (ACA): Normal. Both A1 segments are present. Patent anterior communicating artery (a-comm). --Middle cerebral arteries (MCA): Normal. VENOUS SINUSES: As permitted by contrast timing, patent. ANATOMIC VARIANTS: None Review of the MIP images confirms the above findings. IMPRESSION: 1. No emergent large vessel occlusion or high-grade stenosis of the head or neck. 2. Right upper  lobe consolidation, concerning for pneumonia. These results were called by telephone at the time of interpretation on 02/12/2021 at 9:14 pm to provider [facility] , who verbally acknowledged these results. Electronically Signed   By: Ulyses Jarred M.D.   On: 02/12/2021 21:14   CT ANGIO NECK CODE STROKE  Result Date: 02/12/2021 CLINICAL DATA:  Facial droop and dysarthria EXAM: CT HEAD WITHOUT CONTRAST CT ANGIOGRAPHY OF THE HEAD AND NECK TECHNIQUE: Contiguous axial images were obtained from the base of the skull through the vertex without intravenous contrast. Multidetector CT imaging of the head and neck was performed using the standard protocol during bolus administration of intravenous contrast. Multiplanar CT image reconstructions and MIPs were obtained to evaluate the vascular anatomy. Carotid stenosis measurements (when applicable) are obtained utilizing NASCET criteria, using the distal internal carotid diameter as the denominator. CONTRAST:  80mL OMNIPAQUE IOHEXOL 350 MG/ML SOLN COMPARISON:  None. FINDINGS: CT HEAD FINDINGS Brain: There is no mass, hemorrhage or extra-axial collection. There is generalized atrophy without lobar predilection. There is hypoattenuation of the periventricular white matter, most commonly indicating chronic ischemic microangiopathy. Vascular: No abnormal hyperdensity of the major intracranial arteries or dural venous sinuses. No intracranial atherosclerosis. Skull: The visualized skull base, calvarium and extracranial soft tissues are normal. Sinuses/Orbits: No fluid levels or advanced mucosal thickening of the visualized paranasal sinuses. No mastoid or middle ear effusion. The orbits are normal. ASPECTS ([place] Stroke Program Early CT Score) - Ganglionic level infarction (caudate, lentiform nuclei, internal capsule, insula, M1-M3 cortex): 7 - Supraganglionic infarction (M4-M6 cortex): 3 Total score (0-10 with 10 being normal): 10 CTA NECK FINDINGS SKELETON: There is  no bony spinal canal stenosis. No lytic or blastic lesion. OTHER NECK: Subcentimeter right intraparotid lymph node. Normal left parotid gland. 8 mm right submandibular lymph node. Submandibular glands are normal. UPPER CHEST: Right upper lobe consolidation. AORTIC ARCH: There is no calcific atherosclerosis of the aortic arch. There is no aneurysm, dissection or hemodynamically significant stenosis of the visualized portion of the aorta. Conventional 3 vessel aortic branching pattern. The visualized proximal subclavian arteries are widely patent. RIGHT CAROTID SYSTEM: Normal without aneurysm, dissection or stenosis. LEFT CAROTID SYSTEM: Normal without aneurysm, dissection or stenosis. VERTEBRAL ARTERIES: Left dominant configuration. Both origins are clearly patent. There is no dissection, occlusion or flow-limiting stenosis to the skull base (V1-V3 segments). CTA HEAD FINDINGS POSTERIOR CIRCULATION: --Vertebral arteries: Normal V4 segments. --Inferior cerebellar arteries: Normal. --Basilar artery: Normal. --Superior cerebellar arteries: Normal. --Posterior cerebral arteries (PCA): Normal. ANTERIOR CIRCULATION: --Intracranial internal carotid arteries: Normal. --Anterior cerebral arteries (ACA): Normal. Both A1 segments are present. Patent anterior communicating artery (a-comm). --Middle cerebral arteries (MCA): Normal. VENOUS SINUSES: As permitted by contrast timing, patent. ANATOMIC VARIANTS: None Review of the MIP images confirms the above findings. IMPRESSION: 1. No emergent large vessel occlusion or high-grade stenosis of the head or neck. 2. Right upper lobe consolidation, concerning for pneumonia. These results were called by telephone at the time of interpretation on 02/12/2021 at 9:14 pm to provider [facility] , who verbally acknowledged these results. Electronically Signed   By: Ulyses Jarred M.D.   On: 02/12/2021 21:14

## 2021-02-19 NOTE — Progress Notes (Signed)
OT Cancellation Note  Patient Details Name: Tyler [name] MRN: [id number] DOB: Aug 27, 1928   Cancelled Treatment:    Reason Eval/Treat Not Completed: Fatigue/lethargy limiting ability to participate;Other (comment) (Attempted to see patient this morning and was unable to arouse patient.  Will attempt at later time.) Lodema Hong, [place]  Pager [phone] Office [place] 02/19/2021, 8:56 AM

## 2021-02-19 NOTE — Progress Notes (Addendum)
Occupational Therapy Treatment Patient Details Name: Tyler [name] MRN: [id number] DOB: 07-18-1928 Today's Date: 02/19/2021   History of present illness 87 y.o. male with medical history significant for type 2 diabetes mellitus, essential hypertension, hyperlipidemia, stage IIIa chronic kidney disease with baseline creatinine reported 1.7, anemia, who was admitted with acute metabolic encephalopathy.   OT comments  Patient received in bed and alert. Patient seen with PT for co-treat due to assistance needed with sit to stands and safety. Patient was able to get to eob with verbal cues and min guard assist. Patient ambulated towards sink and required seated rest break before making it to sink and another before standing at sink to perform grooming.  Patient had BM while standing and required assistance to clean, but stood for most of procedure.  Patient making good progress with OT treatment.    Recommendations for follow up therapy are one component of a multi-disciplinary discharge planning process, led by the attending physician.  Recommendations may be updated based on patient status, additional functional criteria and insurance authorization.    Follow Up Recommendations   SNF    Equipment Recommendations  None recommended by OT    Recommendations for Other Services      Precautions / Restrictions Precautions Precautions: Fall       Mobility Bed Mobility Overal bed mobility: Needs Assistance Bed Mobility: Supine to Sit     Supine to sit: Min guard;HOB elevated     General bed mobility comments: verbal cues to use bed rail and on technique    Transfers Overall transfer level: Needs assistance Equipment used: Rolling walker (2 wheeled) Transfers: Sit to/from [facility] Sit to Stand: Min assist;Mod assist;+2 safety/equipment Stand pivot transfers: Min assist;Mod assist;+2 safety/equipment       General transfer comment: min assist of one and mod of  another for sit to stand and transfers    Balance Overall balance assessment: Needs assistance Sitting-balance support: Feet supported Sitting balance-Leahy Scale: Fair     Standing balance support: No upper extremity supported;During functional activity Standing balance-Leahy Scale: Poor Standing balance comment: stood at sink for grooming.  Reliant on RW while ambulating and once fatigued                           ADL either performed or assessed with clinical judgement   ADL Overall ADL's : Needs assistance/impaired     Grooming: Wash/dry hands;Oral care;Standing;Min guard Grooming Details (indicate cue type and reason): stood at sink with min  guard assist for groomin     Lower Body Bathing: Maximal assistance;Sit to/from stand Lower Body Bathing Details (indicate cue type and reason): assisted patient wit cleaning following BM while standing Upper Body Dressing : Minimal assistance;Standing Upper Body Dressing Details (indicate cue type and reason): donned gown Lower Body Dressing: Maximal assistance;Sitting/lateral leans Lower Body Dressing Details (indicate cue type and reason): changed socks while sitting             Functional mobility during ADLs: Minimal assistance;Rolling walker General ADL Comments: patient participated well.  Had BM while standing at sink and required assistance with clean up.     Vision       Perception     Praxis      Cognition Arousal/Alertness: Awake/alert Behavior During Therapy: WFL for tasks assessed/performed Overall Cognitive Status: Impaired/Different from baseline Area of Impairment: Attention;Memory;Following commands;Problem solving;Safety/judgement  Current Attention Level: Sustained Memory: Decreased short-term memory Following Commands: Follows one step commands with increased time;Follows one step commands consistently Safety/Judgement: Decreased awareness of deficits;Decreased  awareness of safety   Problem Solving: Slow processing;Decreased initiation;Requires verbal cues;Requires tactile cues;Difficulty sequencing General Comments: apprehensive at first but willing to participate        Exercises     Shoulder Instructions       General Comments      Pertinent Vitals/ Pain       Pain Assessment: No/denies pain  Home Living                                          Prior Functioning/Environment              Frequency  Min 2X/week        Progress Toward Goals  OT Goals(current goals can now be found in the care plan section)  Progress towards OT goals: Progressing toward goals  Acute Rehab OT Goals Patient Stated Goal: to go home OT Goal Formulation: With patient Time For Goal Achievement: 02/26/21 Potential to Achieve Goals: Fair ADL Goals Pt Will Perform Grooming: with supervision;sitting Pt Will Perform Upper Body Bathing: with supervision;sitting Pt Will Perform Upper Body Dressing: with supervision;sitting Pt Will Transfer to Toilet: with min assist;ambulating;regular height toilet Pt Will Perform Toileting - Clothing Manipulation and hygiene: with min guard assist;sit to/from stand  Plan Discharge plan remains appropriate    Co-evaluation    PT/OT/SLP Co-Evaluation/Treatment: Yes Reason for Co-Treatment: To address functional/ADL transfers;For patient/therapist safety   OT goals addressed during session: ADL's and self-care      AM-PAC OT "6 Clicks" Daily Activity     Outcome Measure   Help from another person eating meals?: None Help from another person taking care of personal grooming?: None Help from another person toileting, which includes using toliet, bedpan, or urinal?: A Lot Help from another person bathing (including washing, rinsing, drying)?: A Lot Help from another person to put on and taking off regular upper body clothing?: A Little Help from another person to put on and taking off  regular lower body clothing?: A Lot 6 Click Score: 17    End of Session Equipment Utilized During Treatment: Gait belt;Rolling walker  OT Visit Diagnosis: Other symptoms and signs involving cognitive function;Muscle weakness (generalized) (M62.81)   Activity Tolerance Patient tolerated treatment well   Patient Left in chair;with call bell/phone within reach;with chair alarm set;with family/visitor present   Nurse Communication Mobility status        Time: 5053-9767 OT Time Calculation (min): 29 min  Charges: OT General Charges $OT Visit: 1 Visit OT Treatments $Self Care/Home Management : 8-22 mins  Lodema Hong, [place]  Pager [phone] Office [place] 02/19/2021, 3:58 PM

## 2021-02-19 NOTE — Consult Note (Addendum)
Consultation Note Date: 02/19/2021   Patient Name: Tyler [name]  DOB: [birth year]  MRN: [id number]  Age / Sex: 87 y.o., male  PCP: [name], MD Referring Physician: Albertine Patricia, MD  Reason for Consultation: Establishing goals of care  HPI/Patient Profile: 87 y.o. male  with past medical history of for type 2 diabetes mellitus, essential hypertension, hyperlipidemia, stage IIIa chronic kidney disease with baseline creatinine reported 1.7, anemia of chronic disease with baseline hemoglobin 9-12, admitted on 02/11/2021 with  acute metabolic encephalopathy after presenting from home to [facility] ED for evaluation of altered mental status.  Patient's family reports several weeks of difficulty swallowing. PMT has been consulted to assist with goals of care conversation.    Clinical Assessment and Goals of Care:  I have reviewed medical records including EPIC notes, labs and imaging, received report from RN, assessed the patient and then met at the bedside along with his wife [name] to discuss diagnosis prognosis, [place], EOL wishes, disposition and options.  I introduced Palliative Medicine as specialized medical care for people living with serious illness. It focuses on providing relief from the symptoms and stress of a serious illness. The goal is to improve quality of life for both the patient and the family.  We discussed a brief life review of the patient and then focused on their current illness. The natural disease trajectory and expectations at EOL were discussed.  [name] shares that she has been married to [place] for 86 years. They have lived in the local area for most of their lives and Keith worked in the [employer] before working at [employer] until retirement at 63. They share 2 sons. 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 He has not been  active since then, mostly sitting on the couch and watching TV. His nutrition varies. Before admission, he was able to walk independently with the assistance of a walker. [name] shares that they have prior experience with difficult decisions and advance care planning, as patient has lost two of his brothers recently. They have grandchildren in the medical field and she has a good understanding of the severity of his illness including potential complications from recurrent aspiration events.   I attempted to elicit values and goals of care important to the patient.   [name] hopes that patient will be able to participate in rehab at SNF to begin ambulating with a walker again then return home. She feels that his quality of life would be worsened if he could not accomplish this, or if he began suffering from further complications of aspiration pneumonia. She values patient's comfort and would transition to hospice if he declines further. He preference [facility] and second choice SNF is [facility], as these are both closer to her for visits.  The difference between aggressive medical intervention and comfort care was considered in light of the patient's goals of care.   A MOST form was introduced. Advanced directives, concepts specific to code status,  artifical feeding and hydration, and rehospitalization were considered and discussed. Decisions were made as summarized below:   Cardiopulmonary Resuscitation: Do Not Attempt Resuscitation (DNR/No CPR)  Medical Interventions: Limited Additional Interventions: Use medical treatment, IV fluids and cardiac monitoring as indicated. Do not use intubation or mechanical ventilation. May consider use of less invasive airway support such as BiPAP or CPAP. Also provide comfort measures. Transfer to hospital if indicated. Avoid intensive care.  Antibiotics: Determine use of limitation of antibiotics when infection occurs  IV Fluids: IV fluids for a defined trial  period  Feeding Tube: No feeding tube     Hospice and Palliative Care services outpatient were explained and offered.  Discussed the importance of continued conversation with family and the medical providers regarding overall plan of care and treatment options, ensuring decisions are within the context of the patient's values and GOCs.    Questions and concerns were addressed.  Hard Choices booklet left for review. The family was encouraged to call with questions or concerns.  PMT will continue to support holistically.   NEXT OF KIN is patient's wife [name]. She reports two sons are POA, uncertain whether durable or HCPOA. Family makes decisions together on patient's behalf.    SUMMARY OF RECOMMENDATIONS   -DNR confirmed, gold form signed and placed in pt's hard chart. Provided copy to patient's wife and will scan copy into [employer] -MOST form completed and placed in pt's hard chart. Provided copy to patient's wife and will scan copy into [employer] -Patient's wife states goal is to discharge to SNF for rehab and hopefully improvement of patient's ambulation so he can return home -[facility] assistance appreciated for outpatient palliative care referral -Family would transition to hospice if he declines -Psychosocial and emotional support provided -PMT remains available for acute needs and support  Code Status/Advance Care Planning: DNR  Palliative Prophylaxis:  Aspiration and Delirium Protocol  Additional Recommendations (Limitations, Scope, Preferences): No Artificial Feeding and No Mechanical Ventilation  Psycho-social/Spiritual:  Desire for further Chaplaincy support:Did not assess Additional Recommendations: Caregiving  Support/Resources, Education on Hospice, and Referral to [employer]   Prognosis:  Unable to determine  Discharge Planning: [place] for rehab with Palliative care service follow-up      Primary Diagnoses: Present on Admission:  Acute  metabolic encephalopathy  Severe sepsis ([place])  Community acquired pneumonia  Elevated troponin  Hyperlipidemia  Essential hypertension  Anemia of chronic disease  CKD (chronic kidney disease) stage 3, GFR 30-59 ml/min ([place])   I have reviewed the medical record, interviewed the patient and family, and examined the patient. The following aspects are pertinent.  Past Medical History:  Diagnosis Date   Cancer ([place])    Prostate   Diabetes mellitus without complication ([place])    Hypertension    Kidney stones    Stroke [facility])    Social History   Socioeconomic History   Marital status: Married    Spouse name: Geologist, engineering   Number of children: 2   Years of education: 12   Highest education level: Not on file  Occupational History   Occupation: retired    Fish farm manager: RETIRED    Comment: [employer]  Tobacco Use   Smoking status: Former    Types: Cigarettes    Quit date: 05/04/1968    Years since quitting: 52.8   Smokeless tobacco: Never  Vaping Use   Vaping Use: Never used  Substance and Sexual Activity   Alcohol use: No   Drug use: No   Sexual activity:  Not on file  Other Topics Concern   Not on file  Social History Narrative   Patient lives at home with his wife [name]) retired. Patient has a high school education. Two Children . No caffeine. Right handed.   Social Determinants of Health   Financial Resource Strain: Not on file  Food Insecurity: Not on file  Transportation Needs: Not on file  Physical Activity: Not on file  Stress: Not on file  Social Connections: Not on file   Family History  Problem Relation Age of Onset   Emphysema Father    Heart Problems Mother    Scheduled Meds:  amLODipine  10 mg Oral Daily   atorvastatin  40 mg Oral Daily   carvedilol  3.125 mg Oral BID WC   clopidogrel  75 mg Oral Q breakfast   docusate sodium  100 mg Oral QHS   enoxaparin (LOVENOX) injection  40 mg Subcutaneous Q24H   famotidine  20 mg Oral Daily   insulin aspart  0-6  Units Subcutaneous TID WC   QUEtiapine  12.5 mg Oral QHS   vitamin B-12  1,000 mcg Oral QHS   Continuous Infusions: PRN Meds:.acetaminophen **OR** acetaminophen, haloperidol lactate, hydrALAZINE, polyethylene glycol Medications Prior to Admission:  Prior to Admission medications   Medication Sig Start Date End Date Taking? Authorizing Provider  acetaminophen (TYLENOL) 650 MG CR tablet Take 650-1,300 mg by mouth 2 (two) times daily. 08/31/19  Yes [provider]  amLODipine (NORVASC) 2.5 MG tablet Take 1 tablet (2.5 mg total) by mouth at bedtime. 06/27/18  Yes Love, Ivan Anchors, PA-C  atorvastatin (LIPITOR) 40 MG tablet Take 40 mg by mouth daily. 11/11/19  Yes [provider]  camphor-menthol Timoteo Ace) lotion Apply topically as needed for itching. 06/27/18  Yes Love, Ivan Anchors, PA-C  carboxymethylcellulose (REFRESH PLUS) 0.5 % SOLN Place 1 drop into both eyes daily as needed (dry eyes).   Yes [provider]  Cholecalciferol (VITAMIN D3) 50 MCG (2000 UT) TABS Take 2,000 Units by mouth daily.   Yes [provider]  clopidogrel (PLAVIX) 75 MG tablet Take 1 tablet (75 mg total) by mouth daily with breakfast. 03/20/15  Yes Dennie Bible, NP  docusate sodium (COLACE) 100 MG capsule Take 100 mg by mouth at bedtime.   Yes [provider]  glimepiride (AMARYL) 2 MG tablet Take 1 mg by mouth daily with breakfast. 10/04/18  Yes [provider]  Melatonin 5 MG TABS Take 5 mg by mouth at bedtime.   Yes [provider]  mirtazapine (REMERON) 15 MG tablet Take 15 mg by mouth at bedtime. 11/11/19  Yes [provider]  Omega 3 1000 MG CAPS Take 1,000 mg by mouth at bedtime. W flaxseed oil   Yes [provider]  pantoprazole (PROTONIX) 40 MG tablet Take 1 tablet (40 mg total) by mouth daily. Patient taking differently: Take 40 mg by mouth at bedtime. 06/27/18  Yes Love, Ivan Anchors, PA-C  sertraline (ZOLOFT) 50 MG tablet Take 50 mg by mouth  daily. 04/29/18  Yes [provider]  Skin Protectants, Misc. (EUCERIN) cream Apply 1 application topically daily as needed for dry skin.   Yes [provider]  vitamin B-12 (CYANOCOBALAMIN) 1000 MCG tablet Take 1,000 mcg by mouth at bedtime.   Yes [provider]  Lancets Glory Rosebush ULTRASOFT) lancets  07/07/12   [provider]  ONE TOUCH ULTRA TEST test strip  09/06/12   [provider]   Allergies  Allergen Reactions   Sulfa Drugs Cross Reactors Anaphylaxis and Rash    Lungs fill with fluid   Metformin Hcl Nausea Only   Review of Systems  Unable to perform ROS: Mental status change   Physical Exam Vitals and nursing note reviewed.  Constitutional:      General: He is not in acute distress. Cardiovascular:     Rate and Rhythm: Normal rate.  Pulmonary:     Effort: Pulmonary effort is normal. No respiratory distress.  Neurological:     Mental Status: He is alert. He is disoriented.    Vital Signs: BP (!) 148/55 (BP Location: Left Arm)   Pulse 70   Temp 98.1 F (36.7 C) (Axillary)   Resp 18   Ht 5' 10" (1.778 m)   Wt 79.2 kg   SpO2 96%   BMI 25.05 kg/m  Pain Scale: 0-10 POSS *See Group Information*: 1-Acceptable,Awake and alert Pain Score: 0-No pain   SpO2: SpO2: 96 % O2 Device:SpO2: 96 % O2 Flow Rate: .   IO: Intake/output summary:  Intake/Output Summary (Last 24 hours) at 02/19/2021 1025 Last data filed at 02/18/2021 1728 Gross per 24 hour  Intake 450 ml  Output 400 ml  Net 50 ml    LBM: Last BM Date: 02/18/21 Baseline Weight: Weight: 70.3 kg Most recent weight: Weight: 79.2 kg     Palliative Assessment/Data:     Time In: 12:30pm Time Out: 1:40pm Time Total: 70 minutes Greater than 50% of this time was spent in counseling and coordinating care related to the above assessment and plan.  Dorthy Cooler, PA-C Palliative Medicine Team Team phone # [phone]  Thank you for allowing the Palliative  Medicine Team to assist in the care of this patient. Please utilize secure chat with additional questions, if there is no response within 30 minutes please call the above phone number.  Palliative Medicine Team providers are available by phone from 7am to 7pm daily and can be reached through the team cell phone.  Should this patient require assistance outside of these hours, please call the patient's attending physician.

## 2021-02-19 NOTE — Care Management Important Message (Signed)
Important Message  Patient Details  Name: Tyler [name] MRN: [id number] Date of Birth: 04/02/1929   Medicare Important Message Given:  Yes     [name] 02/19/2021, 3:42 PM

## 2021-02-19 NOTE — Progress Notes (Signed)
Physical Therapy Treatment Patient Details Name: Tyler [name] MRN: [id number] DOB: Jan 08, 1929 Today's Date: 02/19/2021   History of Present Illness 87 y.o. male with medical history significant for type 2 diabetes mellitus, essential hypertension, hyperlipidemia, stage IIIa chronic kidney disease with baseline creatinine reported 1.7, anemia, who was admitted with acute metabolic encephalopathy.    PT Comments    Patient received in bed, pleasant and much more awake/alert this afternoon. Actually able to progress transfers and gait in room today- had a hard time progressing R LE and needed multimodal cues to pull this LE into swing phase. Easily fatigued and needed multiple rest breaks during session, but really made progress today! Left up in recliner with all needs met, family present and chair alarm active. Continue to recommend SNF.    Recommendations for follow up therapy are one component of a multi-disciplinary discharge planning process, led by the attending physician.  Recommendations may be updated based on patient status, additional functional criteria and insurance authorization.  Follow Up Recommendations  SNF;Supervision/Assistance - 24 hour     Equipment Recommendations  None recommended by PT    Recommendations for Other Services       Precautions / Restrictions Precautions Precautions: Fall Restrictions Weight Bearing Restrictions: No     Mobility  Bed Mobility Overal bed mobility: Needs Assistance Bed Mobility: Supine to Sit     Supine to sit: Min guard;HOB elevated     General bed mobility comments: verbal cues to use bed rail and on technique    Transfers Overall transfer level: Needs assistance Equipment used: Rolling walker (2 wheeled) Transfers: Sit to/from [facility] Sit to Stand: Min assist;Mod assist;+2 safety/equipment Stand pivot transfers: Min assist;Mod assist;+2 safety/equipment       General transfer comment: min  assist of one and mod of another for sit to stand and transfers  Ambulation/Gait Ambulation/Gait assistance: Min assist Gait Distance (Feet): 13 Feet (92f, 367f Assistive device: Rolling walker (2 wheeled) Gait Pattern/deviations: Wide base of support;Step-through pattern;Trunk flexed;Decreased step length - right Gait velocity: decreased   General Gait Details: had a hard time bringing RLE through during swing phase- needed multimodal cues including VC/TC and occasional MinA for progression of RLE. Easily fatigued.   Stairs             Wheelchair Mobility    Modified Rankin (Stroke Patients Only)       Balance Overall balance assessment: Needs assistance Sitting-balance support: Feet supported Sitting balance-Leahy Scale: Fair Sitting balance - Comments: posterior and R lean when not cued to remain in midline   Standing balance support: No upper extremity supported;During functional activity Standing balance-Leahy Scale: Poor Standing balance comment: stood at sink for grooming.  Reliant on RW while ambulating and once fatigued                            Cognition Arousal/Alertness: Awake/alert Behavior During Therapy: WFL for tasks assessed/performed Overall Cognitive Status: Impaired/Different from baseline Area of Impairment: Attention;Memory;Following commands;Problem solving;Safety/judgement                   Current Attention Level: Sustained Memory: Decreased short-term memory Following Commands: Follows one step commands with increased time;Follows one step commands consistently Safety/Judgement: Decreased awareness of deficits;Decreased awareness of safety   Problem Solving: Slow processing;Decreased initiation;Requires verbal cues;Requires tactile cues;Difficulty sequencing General Comments: apprehensive at first but willing to participate      Exercises  General Comments        Pertinent Vitals/Pain Pain Assessment:  No/denies pain Faces Pain Scale: No hurt    Home Living                      Prior Function            PT Goals (current goals can now be found in the care plan section) Acute Rehab PT Goals Patient Stated Goal: to go home PT Goal Formulation: With patient/family Time For Goal Achievement: 02/20/21 Potential to Achieve Goals: Fair Progress towards PT goals: Progressing toward goals    Frequency    Min 3X/week      PT Plan Current plan remains appropriate    Co-evaluation   Reason for Co-Treatment: To address functional/ADL transfers;For patient/therapist safety   OT goals addressed during session: ADL's and self-care      AM-PAC PT "6 Clicks" Mobility   Outcome Measure  Help needed turning from your back to your side while in a flat bed without using bedrails?: A Little Help needed moving from lying on your back to sitting on the side of a flat bed without using bedrails?: A Little Help needed moving to and from a bed to a chair (including a wheelchair)?: A Lot Help needed standing up from a chair using your arms (e.g., wheelchair or bedside chair)?: A Lot Help needed to walk in hospital room?: A Lot Help needed climbing 3-5 steps with a railing? : Total 6 Click Score: 13    End of Session   Activity Tolerance: Patient limited by fatigue Patient left: in chair;with call bell/phone within reach;with chair alarm set;with family/visitor present Nurse Communication: Mobility status PT Visit Diagnosis: Other abnormalities of gait and mobility (R26.89);Muscle weakness (generalized) (M62.81);Unsteadiness on feet (R26.81);Other symptoms and signs involving the nervous system (R29.898);Difficulty in walking, not elsewhere classified (R26.2)     Time: 5974-7185 PT Time Calculation (min) (ACUTE ONLY): 29 min  Charges:  $Gait Training: 8-22 mins (co-tx with OT)                    Ann Lions PT, DPT, PN2   Supplemental Physical Therapist [place]     Pager [phone] Acute Rehab Office [phone]

## 2021-02-20 DIAGNOSIS — Z7189 Other specified counseling: Secondary | ICD-10-CM

## 2021-02-20 LAB — CBC
HCT: 25.9 % — ABNORMAL LOW (ref 39.0–52.0)
Hemoglobin: 8.7 g/dL — ABNORMAL LOW (ref 13.0–17.0)
MCH: 32.8 pg (ref 26.0–34.0)
MCHC: 33.6 g/dL (ref 30.0–36.0)
MCV: 97.7 fL (ref 80.0–100.0)
Platelets: 223 10*3/uL (ref 150–400)
RBC: 2.65 MIL/uL — ABNORMAL LOW (ref 4.22–5.81)
RDW: 13.4 % (ref 11.5–15.5)
WBC: 7.6 10*3/uL (ref 4.0–10.5)
nRBC: 0 % (ref 0.0–0.2)

## 2021-02-20 LAB — BASIC METABOLIC PANEL
Anion gap: 9 (ref 5–15)
BUN: 22 mg/dL (ref 8–23)
CO2: 24 mmol/L (ref 22–32)
Calcium: 9 mg/dL (ref 8.9–10.3)
Chloride: 104 mmol/L (ref 98–111)
Creatinine, Ser: 1.49 mg/dL — ABNORMAL HIGH (ref 0.61–1.24)
GFR, Estimated: 44 mL/min — ABNORMAL LOW (ref >60)
Glucose, Bld: 146 mg/dL — ABNORMAL HIGH (ref 70–99)
Potassium: 3.8 mmol/L (ref 3.5–5.1)
Sodium: 137 mmol/L (ref 135–145)

## 2021-02-20 LAB — GLUCOSE, CAPILLARY
Glucose-Capillary: 149 mg/dL — ABNORMAL HIGH (ref 70–99)
Glucose-Capillary: 175 mg/dL — ABNORMAL HIGH (ref 70–99)
Glucose-Capillary: 210 mg/dL — ABNORMAL HIGH (ref 70–99)
Glucose-Capillary: 220 mg/dL — ABNORMAL HIGH (ref 70–99)

## 2021-02-20 MED ORDER — ENSURE ENLIVE PO LIQD
237.0000 mL | Freq: Two times a day (BID) | ORAL | Status: DC
  Administered 2021-02-20 – 2021-02-24 (×7): 237 mL via ORAL

## 2021-02-20 NOTE — TOC Progression Note (Signed)
Transition of Care [facility]) - Progression Note    Patient Details  Name: AHRON HULBERT MRN: [id number] Date of Birth: July 13, 1928  Transition of Care [facility]) CM/SW [place], [place] Phone Number: 02/20/2021, 1:56 PM  Clinical Narrative:   CSW spoke with Miquel Dunn earlier today to ask about referral, they are unable to accept the patient. [place] still pending. CSW received call from [facility] that they are concerned due to patient's lethargy, asked CSW to withdraw request and reinitiate once patient is awake again. CSW spoke with patient's son, Tania, about SNF offers and provided other bed offers. Family to review. CSW to follow.    Expected Discharge Plan: [place] Barriers to Discharge: Continued Medical Work up, Ship broker  Expected Discharge Plan and Services Expected Discharge Plan: [place] Acute Care Choice: NA Living arrangements for the past 2 months: Single Family Home Expected Discharge Date: 02/18/21                                     Social Determinants of Health (SDOH) Interventions    Readmission Risk Interventions No flowsheet data found.

## 2021-02-20 NOTE — TOC Progression Note (Signed)
Transition of Care [facility]) - Progression Note    Patient Details  Name: TEMITAYO COVALT MRN: [id number] Date of Birth: May 29, 1928  Transition of Care [facility]) CM/SW [place], [place] Phone Number: 02/20/2021, 1:57 PM  Clinical Narrative:   CSW spoke with son, Burrel, who was asking for updates and discussed bed offers. [place] asking about other options near [place], and [place] discussed. Family interested in looking at [facility] and [facility], CSW sent referrals. Awaiting responses. CSW resubmitted insurance authorization request through [facility] as patient is awake and participatory again. CSW to follow.    Expected Discharge Plan: [place] Barriers to Discharge: Continued Medical Work up, Ship broker  Expected Discharge Plan and Services Expected Discharge Plan: [place] Acute Care Choice: NA Living arrangements for the past 2 months: Single Family Home Expected Discharge Date: 02/18/21                                     Social Determinants of Health (SDOH) Interventions    Readmission Risk Interventions No flowsheet data found.

## 2021-02-20 NOTE — Progress Notes (Signed)
PROGRESS NOTE                                                                                                                                                                                                             Patient Demographics:    Tyler [name], is a 87 y.o. male, DOB - 12-08-1928, CHY:[id number]  Outpatient Primary MD for the patient is [name], MD    LOS - 9  Admit date - 02/11/2021    Chief Complaint  Patient presents with   Weakness       Brief Narrative (HPI from H&P)    Tyler [name] is a 87 y.o. male with medical history significant for type 2 diabetes mellitus, essential hypertension, hyperlipidemia, stage IIIa chronic kidney disease with baseline creatinine reported 1.7, anemia of chronic disease with baseline hemoglobin 9-12, who presented from home to [facility] ED for evaluation of altered mental status, had Asp. PNA.   Subjective:   Patient had a good night sleep yesterday, he had a good day yesterday, he was sleepy earlier during the day, but later he was more awake, appropriate, and did very well with PT/OT.     Assessment  & Plan :    Acute Toxic encephalopathy due to aspiration pneumonia and now with delirium  - had detailed discussion with patient's son and life, currently has been having problems swallowing food or liquids for several weeks, post IVF and Unasyn, Speech following.  Head CT & MRI are unremarkable and he has no focal neurological deficits.  He does not have sepsis.  Will monitor closely, minimized benzodiazepines and narcotics, nighttime Seroquel and as needed Haldol, much better, await bed. -Mentation continues to improve, he has some episodes of delirium/lethargy, but it is much less intense and frequent. -This morning he is awake, appropriate, after giving his low-dose Seroquel earlier at 8 PM.  Weakness and deconditioning  -PT/OT following, plan for subacute  rehab  Insignificant mild elevation in troponin patient ACS pattern.  Chest pain-free.  EKG is nonacute, continue statin and Plavix for secondary prevention.  No further work-up.  Mild troponin rise likely underlying infection.  Essential hypertension.  On Norvasc + Coreg.  Dyslipidemia.  Statin.  CKD 3B.  Baseline creatinine around 1.5.  Stable.  Monitor with gentle hydration.  GERD.  Placed on Pepcid.  AOCD.  Stable no acute issue.  History of CVA in the past.  No new focal deficits, head CT stable, no headache, continue Plavix and statin for secondary prevention.  Hypomagnesemia.  Replaced.    DM type II.  Sliding scale.  This SmartLink has not been configured with any valid records.   CBG (last 3)  Recent Labs    02/19/21 1635 02/19/21 2131 02/20/21 0617  GLUCAP 184* 303* 149*         Condition -  Guarded  Family Communication  :   Discussed  with son  at bedside.   Code Status :  DNR  Consults  :  None  PUD Prophylaxis :    Procedures  :     MRI brain - Non acute  CT Head - 1. Stable chronic ischemic changes as above. No acute intracranial process  CT ABD  - Pelvis - No acute intra-abdominal pathology identified. No definite radiographic explanation for the patient's reported symptoms. Patchy infiltrate within the visualized right lung base, possibly infectious or inflammatory. Stable mild left nonobstructing nephrolithiasis. Indeterminate low-attenuation exophytic lesion arising from the upper pole of the right kidney, stable since prior examination of 01/22/2015. Its stability over time, however, favors a hyperdense renal cyst. Aortic Atherosclerosis (ICD10-I70.0).      Disposition Plan  :    Status is: Inpatient  Remains inpatient appropriate because:Altered mental status and IV treatments appropriate due to intensity of illness or inability to take PO  Dispo: The patient is from: Home              Anticipated d/c is to: SNF              Patient  currently is medically stable to d/c.  Patient is medically ready for discharge, awaiting SNF bed availability.   Difficult to place patient No  DVT Prophylaxis  :    enoxaparin (LOVENOX) injection 40 mg Start: 02/18/21 1600 SCDs Start: 02/11/21 2144  Lab Results  Component Value Date   PLT 223 02/20/2021    Diet :  Diet Order             Diet regular Room service appropriate? Yes with Assist; Fluid consistency: Thin  Diet effective now                    Inpatient Medications  Scheduled Meds:  amLODipine  10 mg Oral Daily   atorvastatin  40 mg Oral Daily   carvedilol  3.125 mg Oral BID WC   clopidogrel  75 mg Oral Q breakfast   docusate sodium  100 mg Oral QHS   enoxaparin (LOVENOX) injection  40 mg Subcutaneous Q24H   famotidine  20 mg Oral Daily   insulin aspart  0-6 Units Subcutaneous TID WC   QUEtiapine  12.5 mg Oral QHS   vitamin B-12  1,000 mcg Oral QHS   Continuous Infusions:   PRN Meds:.acetaminophen **OR** acetaminophen, haloperidol lactate, hydrALAZINE, polyethylene glycol   [name] M.D on 02/20/2021 at 11:02 AM  To page go to [email or web address]   [facility] -  Office  [phone]  See all Orders from today for further details    Objective:   Vitals:   02/20/21 0019 02/20/21 0332 02/20/21 0500 02/20/21 0928  BP: (!) 148/67 (!) 155/63  139/66  Pulse: 66 64  71  Resp: 17 17  20   Temp: (!) 97.5 F (36.4 C) 97.8 F (36.6 C)  97.8 F (36.6 C)  TempSrc: Oral  Oral  Oral  SpO2: 99% 97%  97%  Weight:   72.9 kg   Height:        Wt Readings from Last 3 Encounters:  02/20/21 72.9 kg  10/12/18 70.3 kg  07/11/18 74.4 kg     Intake/Output Summary (Last 24 hours) at 02/20/2021 1102 Last data filed at 02/20/2021 0927 Gross per 24 hour  Intake 237 ml  Output 700 ml  Net -463 ml     Physical Exam  Awake Alert, he is more communicative and appropriate today.   Symmetrical Chest wall movement, Good air movement bilaterally,  CTAB RRR,No Gallops,Rubs or new Murmurs, No Parasternal Heave +ve B.Sounds, Abd Soft, No tenderness, No rebound - guarding or rigidity. No Cyanosis, Clubbing or edema, No new Rash or bruise       Data Review:    CBC Recent Labs  Lab 02/14/21 0309 02/15/21 0449 02/16/21 0354 02/20/21 0246  WBC 9.7 6.3 7.1 7.6  HGB 8.7* 8.3* 8.5* 8.7*  HCT 25.4* 24.3* 25.7* 25.9*  PLT 142* 156 164 223  MCV 96.2 97.2 97.3 97.7  MCH 33.0 33.2 32.2 32.8  MCHC 34.3 34.2 33.1 33.6  RDW 14.0 13.9 13.8 13.4  LYMPHSABS 1.4 0.9 1.0  --   MONOABS 0.6 0.5 0.6  --   EOSABS 0.2 0.3 0.3  --   BASOSABS 0.0 0.0 0.0  --     Recent Labs  Lab 02/14/21 0309 02/15/21 0449 02/16/21 0354 02/20/21 0246  NA 136 137 139 137  K 3.7 4.0 3.7 3.8  CL 108 107 108 104  CO2 20* 21* 21* 24  GLUCOSE 144* 148* 143* 146*  BUN 20 24* 22 22  CREATININE 1.64* 1.62* 1.57* 1.49*  CALCIUM 9.1 8.9 9.0 9.0  AST 19 19 20   --   ALT 17 18 21   --   ALKPHOS 60 58 60  --   BILITOT 0.8 0.7 1.0  --   ALBUMIN 3.0* 2.7* 2.9*  --   MG 1.9 1.9 2.0  --   CRP 17.9* 14.0* 10.4*  --   PROCALCITON 4.49 2.55 1.42  --   BNP 375.9* 165.9* 247.5*  --     ------------------------------------------------------------------------------------------------------------------ No results for input(s): CHOL, HDL, LDLCALC, TRIG, CHOLHDL, LDLDIRECT in the last 72 hours.  Lab Results  Component Value Date   HGBA1C 6.9 (H) 02/12/2021   ------------------------------------------------------------------------------------------------------------------ No results for input(s): TSH, T4TOTAL, T3FREE, THYROIDAB in the last 72 hours.  Invalid input(s): FREET3   Cardiac Enzymes No results for input(s): CKMB, TROPONINI, MYOGLOBIN in the last 168 hours.  Invalid input(s): CK ------------------------------------------------------------------------------------------------------------------    Component Value Date/Time   BNP 247.5 (H) 02/16/2021 0354      Radiology Reports CT HEAD WO CONTRAST (5MM)  Result Date: 02/11/2021 CLINICAL DATA:  Weakness, fatigue EXAM: CT HEAD WITHOUT CONTRAST TECHNIQUE: Contiguous axial images were obtained from the base of the skull through the vertex without intravenous contrast. COMPARISON:  10/18/2018 FINDINGS: Brain: Chronic small vessel ischemic changes are again seen throughout the periventricular white matter, stable. No signs of acute infarct or hemorrhage. Lateral ventricles and remaining midline structures are unremarkable. There are no acute extra-axial fluid collections. No mass effect. Vascular: No hyperdense vessel or unexpected calcification. Skull: Normal. Negative for fracture or focal lesion. Sinuses/Orbits: Mild mucosal thickening within the ethmoid air cells. Remaining paranasal sinuses are clear. Other: None. IMPRESSION: 1. Stable chronic ischemic changes as above. No acute intracranial process. Electronically Signed   By: Diana Eves.D.  On: 02/11/2021 20:44   MR BRAIN WO CONTRAST  Result Date: 02/12/2021 CLINICAL DATA:  Acute neurologic deficit EXAM: MRI HEAD WITHOUT CONTRAST TECHNIQUE: Multiplanar, multiecho pulse sequences of the brain and surrounding structures were obtained without intravenous contrast. COMPARISON:  None. FINDINGS: Brain: No acute infarct, mass effect or extra-axial collection. No acute or chronic hemorrhage. There is multifocal hyperintense T2-weighted signal within the white matter. Diffuse, severe atrophy. The midline structures are normal. Vascular: Major flow voids are preserved. Skull and upper cervical spine: Normal calvarium and skull base. Visualized upper cervical spine and soft tissues are normal. Sinuses/Orbits:No paranasal sinus fluid levels or advanced mucosal thickening. No mastoid or middle ear effusion. Normal orbits. IMPRESSION: 1. No acute intracranial abnormality. 2. Diffuse, severe atrophy and findings of chronic microvascular ischemia. Electronically  Signed   By: Ulyses Jarred M.D.   On: 02/12/2021 23:33   CT ABDOMEN PELVIS W CONTRAST  Result Date: 02/11/2021 CLINICAL DATA:  Nausea, vomiting, anorexia, oliguria. Abdominal pain and back pain. EXAM: CT ABDOMEN AND PELVIS WITH CONTRAST TECHNIQUE: Multidetector CT imaging of the abdomen and pelvis was performed using the standard protocol following bolus administration of intravenous contrast. CONTRAST:  39mL OMNIPAQUE IOHEXOL 350 MG/ML SOLN COMPARISON:  07/12/2020 FINDINGS: Lower chest: Patchy infiltrate is seen within the a posterior basal right lower lobe, possibly infectious or inflammatory in nature. Mild left basilar atelectasis. No pleural effusion. Cardiac size within normal limits. No pericardial effusion. Hepatobiliary: No focal liver abnormality is seen. Status post cholecystectomy. No biliary dilatation. Pancreas: Unremarkable Spleen: Unremarkable Adrenals/Urinary Tract: The adrenal glands are unremarkable. The kidneys are normal in size and position. Stable 7 mm nonobstructing calculus within the lower pole the left kidney. No ureteral calculi. No hydronephrosis. Multiple simple bilateral cortical cysts are again identified. There is a stable 20 mm partially exophytic low-attenuation lesion arising from the upper pole of the right kidney which may represent a hyperdense renal cyst or cystic renal mass, but is not well characterized on this single-phase examination. This appears stable, however, since remote prior examination of 01/22/2015 and its stability over time favors a benign lesion. The bladder is unremarkable. Stomach/Bowel: Stomach is within normal limits. Appendix appears normal. No evidence of bowel wall thickening, distention, or inflammatory changes. Vascular/Lymphatic: Aortic atherosclerosis. No enlarged abdominal or pelvic lymph nodes. Reproductive: Prostatectomy and pelvic lymph node dissection has been performed. No recurrent mass lesion. Other: No abdominal wall hernia.  Rectum  unremarkable. Musculoskeletal: Bilateral hip ORIF has been performed. Healed fractures the right superior and inferior pubic rami noted. Degenerative changes are seen within the lumbar spine. No acute bone abnormality. No lytic or blastic bone lesion. IMPRESSION: No acute intra-abdominal pathology identified. No definite radiographic explanation for the patient's reported symptoms. Patchy infiltrate within the visualized right lung base, possibly infectious or inflammatory. Stable mild left nonobstructing nephrolithiasis. Indeterminate low-attenuation exophytic lesion arising from the upper pole of the right kidney, stable since prior examination of 01/22/2015. Its stability over time, however, favors a hyperdense renal cyst. Aortic Atherosclerosis (ICD10-I70.0). Electronically Signed   By: Fidela Salisbury M.D.   On: 02/11/2021 20:55   DG Chest Port 1 View  Result Date: 02/18/2021 CLINICAL DATA:  87 year old male with shortness of breath. Right upper lobe pneumonia. EXAM: PORTABLE CHEST 1 VIEW COMPARISON:  Portable chest 02/15/2021 and earlier. FINDINGS: Portable AP upright view at 1109 hours. Partial clearing of the asymmetric right upper lobe opacity which continues to abut the minor fissure. Mildly lower lung volumes. Mediastinal contours remain normal. Visualized  tracheal air column is within normal limits. No pneumothorax, pulmonary edema, pleural effusion or new pulmonary opacity. Visible bowel-gas pattern within normal limits. Cholecystectomy clips. Osteopenia. No acute osseous abnormality identified. IMPRESSION: Partial clearing of the right upper lobe pneumonia. No new cardiopulmonary abnormality. Electronically Signed   By: Genevie Ann M.D.   On: 02/18/2021 11:35   DG Chest Port 1 View  Result Date: 02/15/2021 CLINICAL DATA:  Shortness of breath EXAM: PORTABLE CHEST 1 VIEW COMPARISON:  Four days ago FINDINGS: Right upper lobe airspace disease. There is no edema, effusion, or pneumothorax. Normal heart  size and stable mediastinal contours. IMPRESSION: Unchanged right upper lobe pneumonia. Electronically Signed   By: Jorje Guild M.D.   On: 02/15/2021 08:09   DG Chest Portable 1 View  Result Date: 02/11/2021 CLINICAL DATA:  Weakness, chest pain EXAM: PORTABLE CHEST 1 VIEW COMPARISON:  Chest x-ray 10/14/2018 FINDINGS: The heart and mediastinal contours are within normal limits. Vague right perihilar airspace density may be due to rotation. No pulmonary edema. No pleural effusion. No pneumothorax. No acute osseous abnormality. IMPRESSION: Low lung volumes with vague right perihilar airspace density may be due to rotation. Otherwise no acute cardiopulmonary abnormality. Electronically Signed   By: Iven Finn M.D.   On: 02/11/2021 19:15   CT HEAD CODE STROKE WO CONTRAST  Result Date: 02/12/2021 CLINICAL DATA:  Facial droop and dysarthria EXAM: CT HEAD WITHOUT CONTRAST CT ANGIOGRAPHY OF THE HEAD AND NECK TECHNIQUE: Contiguous axial images were obtained from the base of the skull through the vertex without intravenous contrast. Multidetector CT imaging of the head and neck was performed using the standard protocol during bolus administration of intravenous contrast. Multiplanar CT image reconstructions and MIPs were obtained to evaluate the vascular anatomy. Carotid stenosis measurements (when applicable) are obtained utilizing NASCET criteria, using the distal internal carotid diameter as the denominator. CONTRAST:  47mL OMNIPAQUE IOHEXOL 350 MG/ML SOLN COMPARISON:  None. FINDINGS: CT HEAD FINDINGS Brain: There is no mass, hemorrhage or extra-axial collection. There is generalized atrophy without lobar predilection. There is hypoattenuation of the periventricular white matter, most commonly indicating chronic ischemic microangiopathy. Vascular: No abnormal hyperdensity of the major intracranial arteries or dural venous sinuses. No intracranial atherosclerosis. Skull: The visualized skull base,  calvarium and extracranial soft tissues are normal. Sinuses/Orbits: No fluid levels or advanced mucosal thickening of the visualized paranasal sinuses. No mastoid or middle ear effusion. The orbits are normal. ASPECTS ([place] Stroke Program Early CT Score) - Ganglionic level infarction (caudate, lentiform nuclei, internal capsule, insula, M1-M3 cortex): 7 - Supraganglionic infarction (M4-M6 cortex): 3 Total score (0-10 with 10 being normal): 10 CTA NECK FINDINGS SKELETON: There is no bony spinal canal stenosis. No lytic or blastic lesion. OTHER NECK: Subcentimeter right intraparotid lymph node. Normal left parotid gland. 8 mm right submandibular lymph node. Submandibular glands are normal. UPPER CHEST: Right upper lobe consolidation. AORTIC ARCH: There is no calcific atherosclerosis of the aortic arch. There is no aneurysm, dissection or hemodynamically significant stenosis of the visualized portion of the aorta. Conventional 3 vessel aortic branching pattern. The visualized proximal subclavian arteries are widely patent. RIGHT CAROTID SYSTEM: Normal without aneurysm, dissection or stenosis. LEFT CAROTID SYSTEM: Normal without aneurysm, dissection or stenosis. VERTEBRAL ARTERIES: Left dominant configuration. Both origins are clearly patent. There is no dissection, occlusion or flow-limiting stenosis to the skull base (V1-V3 segments). CTA HEAD FINDINGS POSTERIOR CIRCULATION: --Vertebral arteries: Normal V4 segments. --Inferior cerebellar arteries: Normal. --Basilar artery: Normal. --Superior cerebellar arteries: Normal. --Posterior cerebral  arteries (PCA): Normal. ANTERIOR CIRCULATION: --Intracranial internal carotid arteries: Normal. --Anterior cerebral arteries (ACA): Normal. Both A1 segments are present. Patent anterior communicating artery (a-comm). --Middle cerebral arteries (MCA): Normal. VENOUS SINUSES: As permitted by contrast timing, patent. ANATOMIC VARIANTS: None Review of the MIP images confirms the  above findings. IMPRESSION: 1. No emergent large vessel occlusion or high-grade stenosis of the head or neck. 2. Right upper lobe consolidation, concerning for pneumonia. These results were called by telephone at the time of interpretation on 02/12/2021 at 9:14 pm to provider [facility] , who verbally acknowledged these results. Electronically Signed   By: Ulyses Jarred M.D.   On: 02/12/2021 21:14   CT ANGIO HEAD CODE STROKE  Result Date: 02/12/2021 CLINICAL DATA:  Facial droop and dysarthria EXAM: CT HEAD WITHOUT CONTRAST CT ANGIOGRAPHY OF THE HEAD AND NECK TECHNIQUE: Contiguous axial images were obtained from the base of the skull through the vertex without intravenous contrast. Multidetector CT imaging of the head and neck was performed using the standard protocol during bolus administration of intravenous contrast. Multiplanar CT image reconstructions and MIPs were obtained to evaluate the vascular anatomy. Carotid stenosis measurements (when applicable) are obtained utilizing NASCET criteria, using the distal internal carotid diameter as the denominator. CONTRAST:  39mL OMNIPAQUE IOHEXOL 350 MG/ML SOLN COMPARISON:  None. FINDINGS: CT HEAD FINDINGS Brain: There is no mass, hemorrhage or extra-axial collection. There is generalized atrophy without lobar predilection. There is hypoattenuation of the periventricular white matter, most commonly indicating chronic ischemic microangiopathy. Vascular: No abnormal hyperdensity of the major intracranial arteries or dural venous sinuses. No intracranial atherosclerosis. Skull: The visualized skull base, calvarium and extracranial soft tissues are normal. Sinuses/Orbits: No fluid levels or advanced mucosal thickening of the visualized paranasal sinuses. No mastoid or middle ear effusion. The orbits are normal. ASPECTS ([place] Stroke Program Early CT Score) - Ganglionic level infarction (caudate, lentiform nuclei, internal capsule, insula, M1-M3 cortex): 7 -  Supraganglionic infarction (M4-M6 cortex): 3 Total score (0-10 with 10 being normal): 10 CTA NECK FINDINGS SKELETON: There is no bony spinal canal stenosis. No lytic or blastic lesion. OTHER NECK: Subcentimeter right intraparotid lymph node. Normal left parotid gland. 8 mm right submandibular lymph node. Submandibular glands are normal. UPPER CHEST: Right upper lobe consolidation. AORTIC ARCH: There is no calcific atherosclerosis of the aortic arch. There is no aneurysm, dissection or hemodynamically significant stenosis of the visualized portion of the aorta. Conventional 3 vessel aortic branching pattern. The visualized proximal subclavian arteries are widely patent. RIGHT CAROTID SYSTEM: Normal without aneurysm, dissection or stenosis. LEFT CAROTID SYSTEM: Normal without aneurysm, dissection or stenosis. VERTEBRAL ARTERIES: Left dominant configuration. Both origins are clearly patent. There is no dissection, occlusion or flow-limiting stenosis to the skull base (V1-V3 segments). CTA HEAD FINDINGS POSTERIOR CIRCULATION: --Vertebral arteries: Normal V4 segments. --Inferior cerebellar arteries: Normal. --Basilar artery: Normal. --Superior cerebellar arteries: Normal. --Posterior cerebral arteries (PCA): Normal. ANTERIOR CIRCULATION: --Intracranial internal carotid arteries: Normal. --Anterior cerebral arteries (ACA): Normal. Both A1 segments are present. Patent anterior communicating artery (a-comm). --Middle cerebral arteries (MCA): Normal. VENOUS SINUSES: As permitted by contrast timing, patent. ANATOMIC VARIANTS: None Review of the MIP images confirms the above findings. IMPRESSION: 1. No emergent large vessel occlusion or high-grade stenosis of the head or neck. 2. Right upper lobe consolidation, concerning for pneumonia. These results were called by telephone at the time of interpretation on 02/12/2021 at 9:14 pm to provider [facility] , who verbally acknowledged these results. Electronically Signed    By: Lennette Bihari  [place] M.D.   On: 02/12/2021 21:14   CT ANGIO NECK CODE STROKE  Result Date: 02/12/2021 CLINICAL DATA:  Facial droop and dysarthria EXAM: CT HEAD WITHOUT CONTRAST CT ANGIOGRAPHY OF THE HEAD AND NECK TECHNIQUE: Contiguous axial images were obtained from the base of the skull through the vertex without intravenous contrast. Multidetector CT imaging of the head and neck was performed using the standard protocol during bolus administration of intravenous contrast. Multiplanar CT image reconstructions and MIPs were obtained to evaluate the vascular anatomy. Carotid stenosis measurements (when applicable) are obtained utilizing NASCET criteria, using the distal internal carotid diameter as the denominator. CONTRAST:  12mL OMNIPAQUE IOHEXOL 350 MG/ML SOLN COMPARISON:  None. FINDINGS: CT HEAD FINDINGS Brain: There is no mass, hemorrhage or extra-axial collection. There is generalized atrophy without lobar predilection. There is hypoattenuation of the periventricular white matter, most commonly indicating chronic ischemic microangiopathy. Vascular: No abnormal hyperdensity of the major intracranial arteries or dural venous sinuses. No intracranial atherosclerosis. Skull: The visualized skull base, calvarium and extracranial soft tissues are normal. Sinuses/Orbits: No fluid levels or advanced mucosal thickening of the visualized paranasal sinuses. No mastoid or middle ear effusion. The orbits are normal. ASPECTS ([place] Stroke Program Early CT Score) - Ganglionic level infarction (caudate, lentiform nuclei, internal capsule, insula, M1-M3 cortex): 7 - Supraganglionic infarction (M4-M6 cortex): 3 Total score (0-10 with 10 being normal): 10 CTA NECK FINDINGS SKELETON: There is no bony spinal canal stenosis. No lytic or blastic lesion. OTHER NECK: Subcentimeter right intraparotid lymph node. Normal left parotid gland. 8 mm right submandibular lymph node. Submandibular glands are normal. UPPER CHEST: Right upper  lobe consolidation. AORTIC ARCH: There is no calcific atherosclerosis of the aortic arch. There is no aneurysm, dissection or hemodynamically significant stenosis of the visualized portion of the aorta. Conventional 3 vessel aortic branching pattern. The visualized proximal subclavian arteries are widely patent. RIGHT CAROTID SYSTEM: Normal without aneurysm, dissection or stenosis. LEFT CAROTID SYSTEM: Normal without aneurysm, dissection or stenosis. VERTEBRAL ARTERIES: Left dominant configuration. Both origins are clearly patent. There is no dissection, occlusion or flow-limiting stenosis to the skull base (V1-V3 segments). CTA HEAD FINDINGS POSTERIOR CIRCULATION: --Vertebral arteries: Normal V4 segments. --Inferior cerebellar arteries: Normal. --Basilar artery: Normal. --Superior cerebellar arteries: Normal. --Posterior cerebral arteries (PCA): Normal. ANTERIOR CIRCULATION: --Intracranial internal carotid arteries: Normal. --Anterior cerebral arteries (ACA): Normal. Both A1 segments are present. Patent anterior communicating artery (a-comm). --Middle cerebral arteries (MCA): Normal. VENOUS SINUSES: As permitted by contrast timing, patent. ANATOMIC VARIANTS: None Review of the MIP images confirms the above findings. IMPRESSION: 1. No emergent large vessel occlusion or high-grade stenosis of the head or neck. 2. Right upper lobe consolidation, concerning for pneumonia. These results were called by telephone at the time of interpretation on 02/12/2021 at 9:14 pm to provider [facility] , who verbally acknowledged these results. Electronically Signed   By: Ulyses Jarred M.D.   On: 02/12/2021 21:14

## 2021-02-21 LAB — GLUCOSE, CAPILLARY
Glucose-Capillary: 196 mg/dL — ABNORMAL HIGH (ref 70–99)
Glucose-Capillary: 222 mg/dL — ABNORMAL HIGH (ref 70–99)
Glucose-Capillary: 200 mg/dL — ABNORMAL HIGH (ref 70–99)
Glucose-Capillary: 192 mg/dL — ABNORMAL HIGH (ref 70–99)

## 2021-02-21 NOTE — Plan of Care (Signed)
  Problem: Education: Goal: Knowledge of General Education information will improve Description: Including pain rating scale, medication(s)/side effects and non-pharmacologic comfort measures Outcome: Progressing   Problem: Health Behavior/Discharge Planning: Goal: Ability to manage health-related needs will improve Outcome: Progressing   Problem: Clinical Measurements: Goal: Will remain free from infection Outcome: Progressing Goal: Diagnostic test results will improve Outcome: Progressing Goal: Respiratory complications will improve Outcome: Progressing Goal: Cardiovascular complication will be avoided Outcome: Progressing   Problem: Skin Integrity: Goal: Risk for impaired skin integrity will decrease Outcome: Progressing   Problem: Safety: Goal: Ability to remain free from injury will improve Outcome: Progressing   Problem: Coping: Goal: Level of anxiety will decrease Outcome: Progressing

## 2021-02-21 NOTE — Progress Notes (Signed)
PROGRESS NOTE                                                                                                                                                                                                             Patient Demographics:    Tyler [name], is a 87 y.o. male, DOB - 21-Feb-1929, VVO:[id number]  Outpatient Primary MD for the patient is Tyler Pretty, MD    LOS - 10  Admit date - 02/11/2021    Chief Complaint  Patient presents with   Weakness       Brief Narrative (HPI from H&P)     Tyler [name] is a 87 y.o. male with medical history significant for type 2 diabetes mellitus, essential hypertension, hyperlipidemia, stage IIIa chronic kidney disease with baseline creatinine reported 1.7, anemia of chronic disease with baseline hemoglobin 9-12, who presented from home to [facility] ED for evaluation of altered mental status, had Asp. PNA.   Subjective:   Patient had a good day yesterday, he was awake most of the day, appetite has improved, was able to get out of bed to chair.   Assessment  & Plan :    Acute Toxic encephalopathy due to aspiration pneumonia and now with delirium  - had detailed discussion with patient's son and life, currently has been having problems swallowing food or liquids for several weeks, post IVF and Unasyn, Speech following.  Head CT & MRI are unremarkable and he has no focal neurological deficits.  He does not have sepsis.  Will monitor closely, minimized benzodiazepines and narcotics, nighttime Seroquel and as needed Haldol, much better, awaiting bed availability. -Mentation continues to improve, he has some episodes of delirium/lethargy, but it is much less intense and frequent. -As well he is less lethargic in a.m. after giving his Seroquel earlier in the evening at 8 PM.  Weakness and deconditioning  -PT/OT following, plan for subacute rehab  Insignificant mild elevation in troponin  patient ACS pattern.  Chest pain-free.  EKG is nonacute, continue statin and Plavix for secondary prevention.  No further work-up.  Mild troponin rise likely underlying infection.  Essential hypertension.  On Norvasc + Coreg.  Dyslipidemia.  Statin.  CKD 3B.  Baseline creatinine around 1.5.  Stable.  Monitor with gentle hydration.  GERD.  Placed on Pepcid.  AOCD.  Stable no acute issue.  History of CVA in the past.  No new focal deficits, head CT stable, no headache, continue Plavix and statin for secondary prevention.  Hypomagnesemia.  Replaced.    DM type II.  Sliding scale.  This SmartLink has not been configured with any valid records.   CBG (last 3)  Recent Labs    02/20/21 1712 02/20/21 2124 02/21/21 0945  GLUCAP 210* 220* 196*         Condition -  Guarded  Family Communication  :   Discussed  with wife  at bedside.   Code Status :  DNR  Consults  :  None  PUD Prophylaxis :    Procedures  :     MRI brain - Non acute  CT Head - 1. Stable chronic ischemic changes as above. No acute intracranial process  CT ABD  - Pelvis - No acute intra-abdominal pathology identified. No definite radiographic explanation for the patient's reported symptoms. Patchy infiltrate within the visualized right lung base, possibly infectious or inflammatory. Stable mild left nonobstructing nephrolithiasis. Indeterminate low-attenuation exophytic lesion arising from the upper pole of the right kidney, stable since prior examination of 01/22/2015. Its stability over time, however, favors a hyperdense renal cyst. Aortic Atherosclerosis (ICD10-I70.0).      Disposition Plan  :    Status is: Inpatient  Remains inpatient appropriate because:Altered mental status and IV treatments appropriate due to intensity of illness or inability to take PO  Dispo: The patient is from: Home              Anticipated d/c is to: SNF              Patient currently is medically stable to d/c.  Patient  is medically ready for discharge, awaiting SNF bed availability.   Difficult to place patient No  DVT Prophylaxis  :    enoxaparin (LOVENOX) injection 40 mg Start: 02/18/21 1600 SCDs Start: 02/11/21 2144  Lab Results  Component Value Date   PLT 223 02/20/2021    Diet :  Diet Order             Diet regular Room service appropriate? Yes with Assist; Fluid consistency: Thin  Diet effective now                    Inpatient Medications  Scheduled Meds:  amLODipine  10 mg Oral Daily   atorvastatin  40 mg Oral Daily   carvedilol  3.125 mg Oral BID WC   clopidogrel  75 mg Oral Q breakfast   docusate sodium  100 mg Oral QHS   enoxaparin (LOVENOX) injection  40 mg Subcutaneous Q24H   famotidine  20 mg Oral Daily   feeding supplement  237 mL Oral BID BM   insulin aspart  0-6 Units Subcutaneous TID WC   QUEtiapine  12.5 mg Oral QHS   vitamin B-12  1,000 mcg Oral QHS   Continuous Infusions:   PRN Meds:.acetaminophen **OR** acetaminophen, haloperidol lactate, hydrALAZINE, polyethylene glycol   [name] M.D on 02/21/2021 at 12:13 PM  To page go to [email or web address]   [facility] -  Office  [phone]  See all Orders from today for further details    Objective:   Vitals:   02/20/21 2321 02/21/21 0347 02/21/21 0714 02/21/21 1121  BP: (!) 152/53 (!) 144/50 (!) 156/58 (!) 131/51  Pulse: 67 63 67 64  Resp: 17 16 19 20   Temp: 98.5 F (36.9 C) (!) 97.5 F (36.4 C) 98.2  F (36.8 C) 97.7 F (36.5 C)  TempSrc:   Oral   SpO2: 96% 97% 96% 97%  Weight:      Height:        Wt Readings from Last 3 Encounters:  02/20/21 72.9 kg  10/12/18 70.3 kg  07/11/18 74.4 kg    No intake or output data in the 24 hours ending 02/21/21 1213    Physical Exam  Awake Alert, he is more conversant, and appropriate today, remains with impaired cognition and insight.   Symmetrical Chest wall movement, Good air movement bilaterally, CTAB RRR,No Gallops,Rubs or new  Murmurs, No Parasternal Heave +ve B.Sounds, Abd Soft, No tenderness, No rebound - guarding or rigidity. No Cyanosis, Clubbing or edema, No new Rash or bruise       Data Review:    CBC Recent Labs  Lab 02/15/21 0449 02/16/21 0354 02/20/21 0246  WBC 6.3 7.1 7.6  HGB 8.3* 8.5* 8.7*  HCT 24.3* 25.7* 25.9*  PLT 156 164 223  MCV 97.2 97.3 97.7  MCH 33.2 32.2 32.8  MCHC 34.2 33.1 33.6  RDW 13.9 13.8 13.4  LYMPHSABS 0.9 1.0  --   MONOABS 0.5 0.6  --   EOSABS 0.3 0.3  --   BASOSABS 0.0 0.0  --     Recent Labs  Lab 02/15/21 0449 02/16/21 0354 02/20/21 0246  NA 137 139 137  K 4.0 3.7 3.8  CL 107 108 104  CO2 21* 21* 24  GLUCOSE 148* 143* 146*  BUN 24* 22 22  CREATININE 1.62* 1.57* 1.49*  CALCIUM 8.9 9.0 9.0  AST 19 20  --   ALT 18 21  --   ALKPHOS 58 60  --   BILITOT 0.7 1.0  --   ALBUMIN 2.7* 2.9*  --   MG 1.9 2.0  --   CRP 14.0* 10.4*  --   PROCALCITON 2.55 1.42  --   BNP 165.9* 247.5*  --     ------------------------------------------------------------------------------------------------------------------ No results for input(s): CHOL, HDL, LDLCALC, TRIG, CHOLHDL, LDLDIRECT in the last 72 hours.  Lab Results  Component Value Date   HGBA1C 6.9 (H) 02/12/2021   ------------------------------------------------------------------------------------------------------------------ No results for input(s): TSH, T4TOTAL, T3FREE, THYROIDAB in the last 72 hours.  Invalid input(s): FREET3   Cardiac Enzymes No results for input(s): CKMB, TROPONINI, MYOGLOBIN in the last 168 hours.  Invalid input(s): CK ------------------------------------------------------------------------------------------------------------------    Component Value Date/Time   BNP 247.5 (H) 02/16/2021 0354     Radiology Reports CT HEAD WO CONTRAST (5MM)  Result Date: 02/11/2021 CLINICAL DATA:  Weakness, fatigue EXAM: CT HEAD WITHOUT CONTRAST TECHNIQUE: Contiguous axial images were obtained  from the base of the skull through the vertex without intravenous contrast. COMPARISON:  10/18/2018 FINDINGS: Brain: Chronic small vessel ischemic changes are again seen throughout the periventricular white matter, stable. No signs of acute infarct or hemorrhage. Lateral ventricles and remaining midline structures are unremarkable. There are no acute extra-axial fluid collections. No mass effect. Vascular: No hyperdense vessel or unexpected calcification. Skull: Normal. Negative for fracture or focal lesion. Sinuses/Orbits: Mild mucosal thickening within the ethmoid air cells. Remaining paranasal sinuses are clear. Other: None. IMPRESSION: 1. Stable chronic ischemic changes as above. No acute intracranial process. Electronically Signed   By: Randa Ngo M.D.   On: 02/11/2021 20:44   MR BRAIN WO CONTRAST  Result Date: 02/12/2021 CLINICAL DATA:  Acute neurologic deficit EXAM: MRI HEAD WITHOUT CONTRAST TECHNIQUE: Multiplanar, multiecho pulse sequences of the brain and surrounding structures were  obtained without intravenous contrast. COMPARISON:  None. FINDINGS: Brain: No acute infarct, mass effect or extra-axial collection. No acute or chronic hemorrhage. There is multifocal hyperintense T2-weighted signal within the white matter. Diffuse, severe atrophy. The midline structures are normal. Vascular: Major flow voids are preserved. Skull and upper cervical spine: Normal calvarium and skull base. Visualized upper cervical spine and soft tissues are normal. Sinuses/Orbits:No paranasal sinus fluid levels or advanced mucosal thickening. No mastoid or middle ear effusion. Normal orbits. IMPRESSION: 1. No acute intracranial abnormality. 2. Diffuse, severe atrophy and findings of chronic microvascular ischemia. Electronically Signed   By: Ulyses Jarred M.D.   On: 02/12/2021 23:33   CT ABDOMEN PELVIS W CONTRAST  Result Date: 02/11/2021 CLINICAL DATA:  Nausea, vomiting, anorexia, oliguria. Abdominal pain and back  pain. EXAM: CT ABDOMEN AND PELVIS WITH CONTRAST TECHNIQUE: Multidetector CT imaging of the abdomen and pelvis was performed using the standard protocol following bolus administration of intravenous contrast. CONTRAST:  29mL OMNIPAQUE IOHEXOL 350 MG/ML SOLN COMPARISON:  07/12/2020 FINDINGS: Lower chest: Patchy infiltrate is seen within the a posterior basal right lower lobe, possibly infectious or inflammatory in nature. Mild left basilar atelectasis. No pleural effusion. Cardiac size within normal limits. No pericardial effusion. Hepatobiliary: No focal liver abnormality is seen. Status post cholecystectomy. No biliary dilatation. Pancreas: Unremarkable Spleen: Unremarkable Adrenals/Urinary Tract: The adrenal glands are unremarkable. The kidneys are normal in size and position. Stable 7 mm nonobstructing calculus within the lower pole the left kidney. No ureteral calculi. No hydronephrosis. Multiple simple bilateral cortical cysts are again identified. There is a stable 20 mm partially exophytic low-attenuation lesion arising from the upper pole of the right kidney which may represent a hyperdense renal cyst or cystic renal mass, but is not well characterized on this single-phase examination. This appears stable, however, since remote prior examination of 01/22/2015 and its stability over time favors a benign lesion. The bladder is unremarkable. Stomach/Bowel: Stomach is within normal limits. Appendix appears normal. No evidence of bowel wall thickening, distention, or inflammatory changes. Vascular/Lymphatic: Aortic atherosclerosis. No enlarged abdominal or pelvic lymph nodes. Reproductive: Prostatectomy and pelvic lymph node dissection has been performed. No recurrent mass lesion. Other: No abdominal wall hernia.  Rectum unremarkable. Musculoskeletal: Bilateral hip ORIF has been performed. Healed fractures the right superior and inferior pubic rami noted. Degenerative changes are seen within the lumbar spine. No  acute bone abnormality. No lytic or blastic bone lesion. IMPRESSION: No acute intra-abdominal pathology identified. No definite radiographic explanation for the patient's reported symptoms. Patchy infiltrate within the visualized right lung base, possibly infectious or inflammatory. Stable mild left nonobstructing nephrolithiasis. Indeterminate low-attenuation exophytic lesion arising from the upper pole of the right kidney, stable since prior examination of 01/22/2015. Its stability over time, however, favors a hyperdense renal cyst. Aortic Atherosclerosis (ICD10-I70.0). Electronically Signed   By: Fidela Salisbury M.D.   On: 02/11/2021 20:55   DG Chest Port 1 View  Result Date: 02/18/2021 CLINICAL DATA:  87 year old male with shortness of breath. Right upper lobe pneumonia. EXAM: PORTABLE CHEST 1 VIEW COMPARISON:  Portable chest 02/15/2021 and earlier. FINDINGS: Portable AP upright view at 1109 hours. Partial clearing of the asymmetric right upper lobe opacity which continues to abut the minor fissure. Mildly lower lung volumes. Mediastinal contours remain normal. Visualized tracheal air column is within normal limits. No pneumothorax, pulmonary edema, pleural effusion or new pulmonary opacity. Visible bowel-gas pattern within normal limits. Cholecystectomy clips. Osteopenia. No acute osseous abnormality identified. IMPRESSION: Partial clearing of the  right upper lobe pneumonia. No new cardiopulmonary abnormality. Electronically Signed   By: Genevie Ann M.D.   On: 02/18/2021 11:35   DG Chest Port 1 View  Result Date: 02/15/2021 CLINICAL DATA:  Shortness of breath EXAM: PORTABLE CHEST 1 VIEW COMPARISON:  Four days ago FINDINGS: Right upper lobe airspace disease. There is no edema, effusion, or pneumothorax. Normal heart size and stable mediastinal contours. IMPRESSION: Unchanged right upper lobe pneumonia. Electronically Signed   By: Jorje Guild M.D.   On: 02/15/2021 08:09   DG Chest Portable 1  View  Result Date: 02/11/2021 CLINICAL DATA:  Weakness, chest pain EXAM: PORTABLE CHEST 1 VIEW COMPARISON:  Chest x-ray 10/14/2018 FINDINGS: The heart and mediastinal contours are within normal limits. Vague right perihilar airspace density may be due to rotation. No pulmonary edema. No pleural effusion. No pneumothorax. No acute osseous abnormality. IMPRESSION: Low lung volumes with vague right perihilar airspace density may be due to rotation. Otherwise no acute cardiopulmonary abnormality. Electronically Signed   By: Iven Finn M.D.   On: 02/11/2021 19:15   CT HEAD CODE STROKE WO CONTRAST  Result Date: 02/12/2021 CLINICAL DATA:  Facial droop and dysarthria EXAM: CT HEAD WITHOUT CONTRAST CT ANGIOGRAPHY OF THE HEAD AND NECK TECHNIQUE: Contiguous axial images were obtained from the base of the skull through the vertex without intravenous contrast. Multidetector CT imaging of the head and neck was performed using the standard protocol during bolus administration of intravenous contrast. Multiplanar CT image reconstructions and MIPs were obtained to evaluate the vascular anatomy. Carotid stenosis measurements (when applicable) are obtained utilizing NASCET criteria, using the distal internal carotid diameter as the denominator. CONTRAST:  68mL OMNIPAQUE IOHEXOL 350 MG/ML SOLN COMPARISON:  None. FINDINGS: CT HEAD FINDINGS Brain: There is no mass, hemorrhage or extra-axial collection. There is generalized atrophy without lobar predilection. There is hypoattenuation of the periventricular white matter, most commonly indicating chronic ischemic microangiopathy. Vascular: No abnormal hyperdensity of the major intracranial arteries or dural venous sinuses. No intracranial atherosclerosis. Skull: The visualized skull base, calvarium and extracranial soft tissues are normal. Sinuses/Orbits: No fluid levels or advanced mucosal thickening of the visualized paranasal sinuses. No mastoid or middle ear effusion. The  orbits are normal. ASPECTS ([place] Stroke Program Early CT Score) - Ganglionic level infarction (caudate, lentiform nuclei, internal capsule, insula, M1-M3 cortex): 7 - Supraganglionic infarction (M4-M6 cortex): 3 Total score (0-10 with 10 being normal): 10 CTA NECK FINDINGS SKELETON: There is no bony spinal canal stenosis. No lytic or blastic lesion. OTHER NECK: Subcentimeter right intraparotid lymph node. Normal left parotid gland. 8 mm right submandibular lymph node. Submandibular glands are normal. UPPER CHEST: Right upper lobe consolidation. AORTIC ARCH: There is no calcific atherosclerosis of the aortic arch. There is no aneurysm, dissection or hemodynamically significant stenosis of the visualized portion of the aorta. Conventional 3 vessel aortic branching pattern. The visualized proximal subclavian arteries are widely patent. RIGHT CAROTID SYSTEM: Normal without aneurysm, dissection or stenosis. LEFT CAROTID SYSTEM: Normal without aneurysm, dissection or stenosis. VERTEBRAL ARTERIES: Left dominant configuration. Both origins are clearly patent. There is no dissection, occlusion or flow-limiting stenosis to the skull base (V1-V3 segments). CTA HEAD FINDINGS POSTERIOR CIRCULATION: --Vertebral arteries: Normal V4 segments. --Inferior cerebellar arteries: Normal. --Basilar artery: Normal. --Superior cerebellar arteries: Normal. --Posterior cerebral arteries (PCA): Normal. ANTERIOR CIRCULATION: --Intracranial internal carotid arteries: Normal. --Anterior cerebral arteries (ACA): Normal. Both A1 segments are present. Patent anterior communicating artery (a-comm). --Middle cerebral arteries (MCA): Normal. VENOUS SINUSES: As permitted by contrast  timing, patent. ANATOMIC VARIANTS: None Review of the MIP images confirms the above findings. IMPRESSION: 1. No emergent large vessel occlusion or high-grade stenosis of the head or neck. 2. Right upper lobe consolidation, concerning for pneumonia. These results were  called by telephone at the time of interpretation on 02/12/2021 at 9:14 pm to provider [facility] , who verbally acknowledged these results. Electronically Signed   By: Ulyses Jarred M.D.   On: 02/12/2021 21:14   CT ANGIO HEAD CODE STROKE  Result Date: 02/12/2021 CLINICAL DATA:  Facial droop and dysarthria EXAM: CT HEAD WITHOUT CONTRAST CT ANGIOGRAPHY OF THE HEAD AND NECK TECHNIQUE: Contiguous axial images were obtained from the base of the skull through the vertex without intravenous contrast. Multidetector CT imaging of the head and neck was performed using the standard protocol during bolus administration of intravenous contrast. Multiplanar CT image reconstructions and MIPs were obtained to evaluate the vascular anatomy. Carotid stenosis measurements (when applicable) are obtained utilizing NASCET criteria, using the distal internal carotid diameter as the denominator. CONTRAST:  76mL OMNIPAQUE IOHEXOL 350 MG/ML SOLN COMPARISON:  None. FINDINGS: CT HEAD FINDINGS Brain: There is no mass, hemorrhage or extra-axial collection. There is generalized atrophy without lobar predilection. There is hypoattenuation of the periventricular white matter, most commonly indicating chronic ischemic microangiopathy. Vascular: No abnormal hyperdensity of the major intracranial arteries or dural venous sinuses. No intracranial atherosclerosis. Skull: The visualized skull base, calvarium and extracranial soft tissues are normal. Sinuses/Orbits: No fluid levels or advanced mucosal thickening of the visualized paranasal sinuses. No mastoid or middle ear effusion. The orbits are normal. ASPECTS ([place] Stroke Program Early CT Score) - Ganglionic level infarction (caudate, lentiform nuclei, internal capsule, insula, M1-M3 cortex): 7 - Supraganglionic infarction (M4-M6 cortex): 3 Total score (0-10 with 10 being normal): 10 CTA NECK FINDINGS SKELETON: There is no bony spinal canal stenosis. No lytic or blastic lesion. OTHER  NECK: Subcentimeter right intraparotid lymph node. Normal left parotid gland. 8 mm right submandibular lymph node. Submandibular glands are normal. UPPER CHEST: Right upper lobe consolidation. AORTIC ARCH: There is no calcific atherosclerosis of the aortic arch. There is no aneurysm, dissection or hemodynamically significant stenosis of the visualized portion of the aorta. Conventional 3 vessel aortic branching pattern. The visualized proximal subclavian arteries are widely patent. RIGHT CAROTID SYSTEM: Normal without aneurysm, dissection or stenosis. LEFT CAROTID SYSTEM: Normal without aneurysm, dissection or stenosis. VERTEBRAL ARTERIES: Left dominant configuration. Both origins are clearly patent. There is no dissection, occlusion or flow-limiting stenosis to the skull base (V1-V3 segments). CTA HEAD FINDINGS POSTERIOR CIRCULATION: --Vertebral arteries: Normal V4 segments. --Inferior cerebellar arteries: Normal. --Basilar artery: Normal. --Superior cerebellar arteries: Normal. --Posterior cerebral arteries (PCA): Normal. ANTERIOR CIRCULATION: --Intracranial internal carotid arteries: Normal. --Anterior cerebral arteries (ACA): Normal. Both A1 segments are present. Patent anterior communicating artery (a-comm). --Middle cerebral arteries (MCA): Normal. VENOUS SINUSES: As permitted by contrast timing, patent. ANATOMIC VARIANTS: None Review of the MIP images confirms the above findings. IMPRESSION: 1. No emergent large vessel occlusion or high-grade stenosis of the head or neck. 2. Right upper lobe consolidation, concerning for pneumonia. These results were called by telephone at the time of interpretation on 02/12/2021 at 9:14 pm to provider [facility] , who verbally acknowledged these results. Electronically Signed   By: Ulyses Jarred M.D.   On: 02/12/2021 21:14   CT ANGIO NECK CODE STROKE  Result Date: 02/12/2021 CLINICAL DATA:  Facial droop and dysarthria EXAM: CT HEAD WITHOUT CONTRAST CT ANGIOGRAPHY  OF THE HEAD  AND NECK TECHNIQUE: Contiguous axial images were obtained from the base of the skull through the vertex without intravenous contrast. Multidetector CT imaging of the head and neck was performed using the standard protocol during bolus administration of intravenous contrast. Multiplanar CT image reconstructions and MIPs were obtained to evaluate the vascular anatomy. Carotid stenosis measurements (when applicable) are obtained utilizing NASCET criteria, using the distal internal carotid diameter as the denominator. CONTRAST:  27mL OMNIPAQUE IOHEXOL 350 MG/ML SOLN COMPARISON:  None. FINDINGS: CT HEAD FINDINGS Brain: There is no mass, hemorrhage or extra-axial collection. There is generalized atrophy without lobar predilection. There is hypoattenuation of the periventricular white matter, most commonly indicating chronic ischemic microangiopathy. Vascular: No abnormal hyperdensity of the major intracranial arteries or dural venous sinuses. No intracranial atherosclerosis. Skull: The visualized skull base, calvarium and extracranial soft tissues are normal. Sinuses/Orbits: No fluid levels or advanced mucosal thickening of the visualized paranasal sinuses. No mastoid or middle ear effusion. The orbits are normal. ASPECTS ([place] Stroke Program Early CT Score) - Ganglionic level infarction (caudate, lentiform nuclei, internal capsule, insula, M1-M3 cortex): 7 - Supraganglionic infarction (M4-M6 cortex): 3 Total score (0-10 with 10 being normal): 10 CTA NECK FINDINGS SKELETON: There is no bony spinal canal stenosis. No lytic or blastic lesion. OTHER NECK: Subcentimeter right intraparotid lymph node. Normal left parotid gland. 8 mm right submandibular lymph node. Submandibular glands are normal. UPPER CHEST: Right upper lobe consolidation. AORTIC ARCH: There is no calcific atherosclerosis of the aortic arch. There is no aneurysm, dissection or hemodynamically significant stenosis of the visualized portion of  the aorta. Conventional 3 vessel aortic branching pattern. The visualized proximal subclavian arteries are widely patent. RIGHT CAROTID SYSTEM: Normal without aneurysm, dissection or stenosis. LEFT CAROTID SYSTEM: Normal without aneurysm, dissection or stenosis. VERTEBRAL ARTERIES: Left dominant configuration. Both origins are clearly patent. There is no dissection, occlusion or flow-limiting stenosis to the skull base (V1-V3 segments). CTA HEAD FINDINGS POSTERIOR CIRCULATION: --Vertebral arteries: Normal V4 segments. --Inferior cerebellar arteries: Normal. --Basilar artery: Normal. --Superior cerebellar arteries: Normal. --Posterior cerebral arteries (PCA): Normal. ANTERIOR CIRCULATION: --Intracranial internal carotid arteries: Normal. --Anterior cerebral arteries (ACA): Normal. Both A1 segments are present. Patent anterior communicating artery (a-comm). --Middle cerebral arteries (MCA): Normal. VENOUS SINUSES: As permitted by contrast timing, patent. ANATOMIC VARIANTS: None Review of the MIP images confirms the above findings. IMPRESSION: 1. No emergent large vessel occlusion or high-grade stenosis of the head or neck. 2. Right upper lobe consolidation, concerning for pneumonia. These results were called by telephone at the time of interpretation on 02/12/2021 at 9:14 pm to provider [facility] , who verbally acknowledged these results. Electronically Signed   By: Ulyses Jarred M.D.   On: 02/12/2021 21:14

## 2021-02-21 NOTE — Progress Notes (Signed)
Physical Therapy Treatment Patient Details Name: Tyler [name] MRN: [id number] DOB: 02-28-1929 Today's Date: 02/21/2021   History of Present Illness 87 y.o. male with medical history significant for type 2 diabetes mellitus, essential hypertension, hyperlipidemia, stage IIIa chronic kidney disease with baseline creatinine reported 1.7, anemia, who was admitted with acute metabolic encephalopathy.    PT Comments    Patient received in bed, pleasant and cooperative- found to have had incontinent BM, able to roll side to side with MinA and needed totalA for pericare. From there able to get to EOB and progress gait distance today- likely could have gone further but then had another incontinent BM right at the doorway, and we had to head back into the room to manage it. Progressed to being able to perform functional transfers with one person assist today as well! Left up in recliner with all needs met, chair alarm active. Will continue efforts, but continue to recommend SNF.   To be able to return home, would need to be able to walk at least house hold distances with no more than min guard with LRAD, navigate steps, and have 24/7 physical assistance. Currently still requires 2 persons for safety and chair follow to progress mobility due to fatigue and general deconditioning, have not been able to attempt steps just yet.     Recommendations for follow up therapy are one component of a multi-disciplinary discharge planning process, led by the attending physician.  Recommendations may be updated based on patient status, additional functional criteria and insurance authorization.  Follow Up Recommendations  SNF;Supervision/Assistance - 24 hour     Equipment Recommendations  None recommended by PT    Recommendations for Other Services       Precautions / Restrictions Precautions Precautions: Fall Precaution Comments: bring brief- has had episodes of stool incontinence Restrictions Weight  Bearing Restrictions: No     Mobility  Bed Mobility Overal bed mobility: Needs Assistance Bed Mobility: Rolling;Sidelying to Sit Rolling: Min assist Sidelying to sit: Min assist       General bed mobility comments: verbal cues to use bed rail and on technique    Transfers Overall transfer level: Needs assistance Equipment used: Rolling walker (2 wheeled) Transfers: Sit to/from Stand Sit to Stand: Min assist;+2 physical assistance         General transfer comment: able to get to upright standing with MinAx2 on first attempt from bed, then progressed to [place] of one person when standing from the recliner/pushing up on arm rests with BUEs  Ambulation/Gait Ambulation/Gait assistance: Min assist Gait Distance (Feet): 15 Feet Assistive device: Rolling walker (2 wheeled) Gait Pattern/deviations: Wide base of support;Step-through pattern;Trunk flexed;Decreased step length - right Gait velocity: decreased   General Gait Details: still having a hard time progressing R LE through to swing phase with gait and needs MinA for consistent progression of R LE- otherwise will attempt to keep walking while it is "left behind". Fatigues easily. Attempted walking in the hallway but unable due to stool incontinence when we go to the door way.   Stairs             Wheelchair Mobility    Modified Rankin (Stroke Patients Only)       Balance Overall balance assessment: Needs assistance Sitting-balance support: Feet supported Sitting balance-Leahy Scale: Fair Sitting balance - Comments: posterior and R lean when not cued to remain in midline   Standing balance support: During functional activity;Bilateral upper extremity supported Standing balance-Leahy Scale: Poor Standing balance comment:  reliant on BUE support dynamically                            Cognition Arousal/Alertness: Awake/alert Behavior During Therapy: WFL for tasks assessed/performed Overall Cognitive  Status: Impaired/Different from baseline Area of Impairment: Attention;Memory;Following commands;Problem solving;Safety/judgement                     Memory: Decreased short-term memory Following Commands: Follows one step commands with increased time;Follows one step commands consistently Safety/Judgement: Decreased awareness of deficits;Decreased awareness of safety   Problem Solving: Slow processing;Decreased initiation;Requires verbal cues;Requires tactile cues;Difficulty sequencing General Comments: pleasant and cooperative; does have limited awareness of safety/deficits, but generally follows cues when he can hear what therapist is asking him to do      Exercises      General Comments        Pertinent Vitals/Pain Pain Assessment: No/denies pain Faces Pain Scale: No hurt    Home Living                      Prior Function            PT Goals (current goals can now be found in the care plan section) Acute Rehab PT Goals Patient Stated Goal: to go home PT Goal Formulation: With patient/family Time For Goal Achievement: 03/06/21 Potential to Achieve Goals: Fair Progress towards PT goals: Progressing toward goals    Frequency    Min 3X/week      PT Plan Current plan remains appropriate    Co-evaluation              AM-PAC PT "6 Clicks" Mobility   Outcome Measure  Help needed turning from your back to your side while in a flat bed without using bedrails?: A Little Help needed moving from lying on your back to sitting on the side of a flat bed without using bedrails?: A Little Help needed moving to and from a bed to a chair (including a wheelchair)?: A Little Help needed standing up from a chair using your arms (e.g., wheelchair or bedside chair)?: A Lot Help needed to walk in hospital room?: A Little Help needed climbing 3-5 steps with a railing? : Total 6 Click Score: 15    End of Session Equipment Utilized During Treatment: Gait  belt Activity Tolerance: Patient tolerated treatment well Patient left: in chair;with call bell/phone within reach;with chair alarm set Nurse Communication: Mobility status PT Visit Diagnosis: Other abnormalities of gait and mobility (R26.89);Muscle weakness (generalized) (M62.81);Unsteadiness on feet (R26.81);Other symptoms and signs involving the nervous system (R29.898);Difficulty in walking, not elsewhere classified (R26.2)     Time: 1201-1219 PT Time Calculation (min) (ACUTE ONLY): 18 min  Charges:  $Gait Training: 8-22 mins                    Windell Norfolk, DPT, PN2   Supplemental Physical Therapist [place]    Pager [phone] Acute Rehab Office [phone]

## 2021-02-21 NOTE — TOC Progression Note (Signed)
Transition of Care [facility]) - Progression Note    Patient Details  Name: Tyler [name] MRN: [id number] Date of Birth: [birth year]  Transition of Care [facility]) CM/SW [place], [place] Phone Number: 02/21/2021, 3:54 PM  Clinical Narrative:   CSW contacted [facility] several times throughout the day to ask about referral, finally got update that they can admit patient on Monday. CSW spoke with patient's son to update. CSW to follow.    Expected Discharge Plan: [place] Barriers to Discharge: Continued Medical Work up, Ship broker  Expected Discharge Plan and Services Expected Discharge Plan: [place] Acute Care Choice: NA Living arrangements for the past 2 months: Single Family Home Expected Discharge Date: 02/18/21                                     Social Determinants of Health (SDOH) Interventions    Readmission Risk Interventions No flowsheet data found.

## 2021-02-22 LAB — GLUCOSE, CAPILLARY
Glucose-Capillary: 197 mg/dL — ABNORMAL HIGH (ref 70–99)
Glucose-Capillary: 338 mg/dL — ABNORMAL HIGH (ref 70–99)
Glucose-Capillary: 176 mg/dL — ABNORMAL HIGH (ref 70–99)

## 2021-02-22 MED ORDER — QUETIAPINE FUMARATE 25 MG PO TABS
25.0000 mg | ORAL_TABLET | Freq: Every day | ORAL | Status: DC
  Administered 2021-02-22 – 2021-02-24 (×3): 25 mg via ORAL
  Filled 2021-02-22 (×3): qty 1

## 2021-02-22 MED ORDER — PHENOL 1.4 % MT LIQD: 1.0000 | OROMUCOSAL | Status: DC | PRN

## 2021-02-22 NOTE — Progress Notes (Signed)
Occupational Therapy Treatment Patient Details Name: Tyler [name] MRN: [id number] DOB: [birth year] Today's Date: 02/22/2021   History of present illness 87 y.o. male with medical history significant for type 2 diabetes mellitus, essential hypertension, hyperlipidemia, stage IIIa chronic kidney disease with baseline creatinine reported 1.7, anemia, who was admitted with acute metabolic encephalopathy.   OT comments  Pt presenting with mildly increased confusion this session and perseverating on dying before he leaves the hospital. Additionally, he was easily distractible and required increased cuing to participate in therapy this session. Once sitting EOB, pt completed all UE exercises below in sitting, and all other exercises listed below in supine. OT will continue to follow pt acutely.    Recommendations for follow up therapy are one component of a multi-disciplinary discharge planning process, led by the attending physician.  Recommendations may be updated based on patient status, additional functional criteria and insurance authorization.    Follow Up Recommendations  SNF    Equipment Recommendations  None recommended by OT    Recommendations for Other Services      Precautions / Restrictions Precautions Precautions: Fall Restrictions Weight Bearing Restrictions: No       Mobility Bed Mobility Overal bed mobility: Needs Assistance Bed Mobility: Rolling;Sidelying to Sit Rolling: Min assist Sidelying to sit: Min assist   Sit to supine: Min assist   General bed mobility comments: verbal cues to use bed rail and on technique    Transfers                 General transfer comment: Deferred due to increased assist needde    Balance Overall balance assessment: Needs assistance Sitting-balance support: Feet supported Sitting balance-Leahy Scale: Fair                                     ADL either performed or assessed with clinical judgement    ADL Overall ADL's : Needs assistance/impaired                                       General ADL Comments: focused session on exercsies in bed this session     Vision       Perception     Praxis      Cognition Arousal/Alertness: Awake/alert Behavior During Therapy: WFL for tasks assessed/performed Overall Cognitive Status: Impaired/Different from baseline Area of Impairment: Attention;Memory;Following commands;Problem solving;Safety/judgement                   Current Attention Level: Sustained Memory: Decreased short-term memory Following Commands: Follows one step commands with increased time;Follows one step commands consistently Safety/Judgement: Decreased awareness of deficits;Decreased awareness of safety   Problem Solving: Slow processing;Decreased initiation;Requires verbal cues;Requires tactile cues;Difficulty sequencing General Comments: pleasant and cooperative; does have limited awareness of safety/deficits, but generally follows cues when he can hear what therapist is asking him to do. Continues to make comments about dying before he can get home        Exercises Exercises: Other exercises;General Upper Extremity General Exercises - Upper Extremity Shoulder Flexion: AROM;Both;10 reps Shoulder ABduction: AROM;Both;10 reps Elbow Flexion: AROM;Both;10 reps Elbow Extension: AROM;Both;10 reps Digit Composite Flexion: AROM;Both;10 reps Composite Extension: AROM;Both;10 reps Other Exercises Other Exercises: Pt using bed rails to pull self forward into long sit with min A x5 Other Exercises: Pt using rail  above head to pull himself up in bed x1 Other Exercises: Glute bridges x5 Other Exercises: Assisted leg raises x5   Shoulder Instructions       General Comments Pt making comments about dying before he gets to rehab this session    Pertinent Vitals/ Pain       Pain Assessment: No/denies pain  Home Living                                           Prior Functioning/Environment              Frequency  Min 2X/week        Progress Toward Goals  OT Goals(current goals can now be found in the care plan section)  Progress towards OT goals: Progressing toward goals  Acute Rehab OT Goals Patient Stated Goal: to go home before he dies OT Goal Formulation: With patient Time For Goal Achievement: 02/26/21 Potential to Achieve Goals: Fair ADL Goals Pt Will Perform Grooming: with supervision;sitting Pt Will Perform Upper Body Bathing: with supervision;sitting Pt Will Perform Upper Body Dressing: with supervision;sitting Pt Will Transfer to Toilet: with min assist;ambulating;regular height toilet Pt Will Perform Toileting - Clothing Manipulation and hygiene: with min guard assist;sit to/from stand  Plan Discharge plan remains appropriate    Co-evaluation                 AM-PAC OT "6 Clicks" Daily Activity     Outcome Measure   Help from another person eating meals?: None Help from another person taking care of personal grooming?: None Help from another person toileting, which includes using toliet, bedpan, or urinal?: A Lot Help from another person bathing (including washing, rinsing, drying)?: A Lot Help from another person to put on and taking off regular upper body clothing?: A Little Help from another person to put on and taking off regular lower body clothing?: A Lot 6 Click Score: 17    End of Session    OT Visit Diagnosis: Other symptoms and signs involving cognitive function;Muscle weakness (generalized) (M62.81)   Activity Tolerance Patient tolerated treatment well   Patient Left in bed;with call bell/phone within reach;with bed alarm set   Nurse Communication Mobility status        Time: 8889-1694 OT Time Calculation (min): 16 min  Charges: OT General Charges $OT Visit: 1 Visit OT Treatments $Therapeutic Exercise: 8-22 mins  Mamye Bolds H., OTR/L Acute  Rehabilitation  Aleeya Veitch Elane Daleisa Halperin 02/22/2021, 3:20 PM

## 2021-02-22 NOTE — Progress Notes (Signed)
PROGRESS NOTE                                                                                                                                                                                                             Patient Demographics:    Tyler [name], is a 87 y.o. male, DOB - Jul 23, 1928, WJX:[id number]  Outpatient Primary MD for the patient is [name], MD    LOS - 11  Admit date - 02/11/2021    Chief Complaint  Patient presents with   Weakness       Brief Narrative (HPI from H&P)     Tyler [name] is a 87 y.o. male with medical history significant for type 2 diabetes mellitus, essential hypertension, hyperlipidemia, stage IIIa chronic kidney disease with baseline creatinine reported 1.7, anemia of chronic disease with baseline hemoglobin 9-12, who presented from home to [facility] ED for evaluation of altered mental status, had Asp. PNA.   Subjective:   Patient had good day yesterday, does appear with some mild delirium overnight, he does report some sore throat earlier today, but nothing on physical exam.     Assessment  & Plan :    Acute Toxic encephalopathy due to aspiration pneumonia and now with delirium  - had detailed discussion with patient's son and life, currently has been having problems swallowing food or liquids for several weeks, post IVF and Unasyn, Speech following.  Head CT & MRI are unremarkable and he has no focal neurological deficits.  He does not have sepsis.  Will monitor closely, minimized benzodiazepines and narcotics, nighttime Seroquel and as needed Haldol, much better, awaiting bed availability. -Mentation continues to improve, he has some episodes of delirium/lethargy, but it is much less intense and frequent. -As well he is less lethargic in a.m. after giving his Seroquel earlier in the evening at 8 PM.  Weakness and deconditioning  -PT/OT following, plan for subacute  rehab  Insignificant mild elevation in troponin patient ACS pattern.  Chest pain-free.  EKG is nonacute, continue statin and Plavix for secondary prevention.  No further work-up.  Mild troponin rise likely underlying infection.  Essential hypertension.  On Norvasc + Coreg.  Dyslipidemia.  Statin.  CKD 3B.  Baseline creatinine around 1.5.  Stable.  Monitor with gentle hydration.  GERD.  Placed on Pepcid.  AOCD.  Stable no  acute issue.  History of CVA in the past.  No new focal deficits, head CT stable, no headache, continue Plavix and statin for secondary prevention.  Hypomagnesemia.  Replaced.    DM type II.  Sliding scale.  This SmartLink has not been configured with any valid records.   CBG (last 3)  Recent Labs    02/21/21 2104 02/22/21 0620 02/22/21 1219  GLUCAP 192* 197* 338*         Condition -  Guarded  Family Communication  :   None   at bedside.   Code Status :  DNR  Consults  :  None  PUD Prophylaxis :    Procedures  :     MRI brain - Non acute  CT Head - 1. Stable chronic ischemic changes as above. No acute intracranial process  CT ABD  - Pelvis - No acute intra-abdominal pathology identified. No definite radiographic explanation for the patient's reported symptoms. Patchy infiltrate within the visualized right lung base, possibly infectious or inflammatory. Stable mild left nonobstructing nephrolithiasis. Indeterminate low-attenuation exophytic lesion arising from the upper pole of the right kidney, stable since prior examination of 01/22/2015. Its stability over time, however, favors a hyperdense renal cyst. Aortic Atherosclerosis (ICD10-I70.0).      Disposition Plan  :    Status is: Inpatient  Remains inpatient appropriate because:Altered mental status and IV treatments appropriate due to intensity of illness or inability to take PO  Dispo: The patient is from: Home              Anticipated d/c is to: SNF              Patient currently is  medically stable to d/c.  Patient is medically ready for discharge, awaiting SNF bed availability.   Difficult to place patient No  DVT Prophylaxis  :    enoxaparin (LOVENOX) injection 40 mg Start: 02/18/21 1600 SCDs Start: 02/11/21 2144  Lab Results  Component Value Date   PLT 223 02/20/2021    Diet :  Diet Order             Diet regular Room service appropriate? Yes with Assist; Fluid consistency: Thin  Diet effective now                    Inpatient Medications  Scheduled Meds:  amLODipine  10 mg Oral Daily   atorvastatin  40 mg Oral Daily   carvedilol  3.125 mg Oral BID WC   clopidogrel  75 mg Oral Q breakfast   docusate sodium  100 mg Oral QHS   enoxaparin (LOVENOX) injection  40 mg Subcutaneous Q24H   famotidine  20 mg Oral Daily   feeding supplement  237 mL Oral BID BM   insulin aspart  0-6 Units Subcutaneous TID WC   QUEtiapine  25 mg Oral QHS   vitamin B-12  1,000 mcg Oral QHS   Continuous Infusions:   PRN Meds:.acetaminophen **OR** acetaminophen, haloperidol lactate, hydrALAZINE, polyethylene glycol   Tyler [name] M.D on 02/22/2021 at 12:46 PM  To page go to [email or web address]   [facility] -  Office  [phone]  See all Orders from today for further details    Objective:   Vitals:   02/22/21 0350 02/22/21 0639 02/22/21 0747 02/22/21 1216  BP: (!) 165/76  (!) 158/72 (!) 149/66  Pulse: 63  71 67  Resp: 16  14 14   Temp: 98.3 F (36.8 C)  98.2 F (36.8 C) 98  F (36.7 C)  TempSrc: Axillary  Oral Oral  SpO2: 97%  99% 98%  Weight:  73.1 kg    Height:        Wt Readings from Last 3 Encounters:  02/22/21 73.1 kg  10/12/18 70.3 kg  07/11/18 74.4 kg     Intake/Output Summary (Last 24 hours) at 02/22/2021 1246 Last data filed at 02/22/2021 1000 Gross per 24 hour  Intake 336 ml  Output --  Net 336 ml      Physical Exam  Awake Alert, Oriented X 1, exam with no significant erythema or any oral lesions. Symmetrical Chest  wall movement, Good air movement bilaterally, CTAB RRR,No Gallops,Rubs or new Murmurs, No Parasternal Heave +ve B.Sounds, Abd Soft, No tenderness, No rebound - guarding or rigidity. No Cyanosis, Clubbing or edema, No new Rash or bruise        Data Review:    CBC Recent Labs  Lab 02/16/21 0354 02/20/21 0246  WBC 7.1 7.6  HGB 8.5* 8.7*  HCT 25.7* 25.9*  PLT 164 223  MCV 97.3 97.7  MCH 32.2 32.8  MCHC 33.1 33.6  RDW 13.8 13.4  LYMPHSABS 1.0  --   MONOABS 0.6  --   EOSABS 0.3  --   BASOSABS 0.0  --     Recent Labs  Lab 02/16/21 0354 02/20/21 0246  NA 139 137  K 3.7 3.8  CL 108 104  CO2 21* 24  GLUCOSE 143* 146*  BUN 22 22  CREATININE 1.57* 1.49*  CALCIUM 9.0 9.0  AST 20  --   ALT 21  --   ALKPHOS 60  --   BILITOT 1.0  --   ALBUMIN 2.9*  --   MG 2.0  --   CRP 10.4*  --   PROCALCITON 1.42  --   BNP 247.5*  --     ------------------------------------------------------------------------------------------------------------------ No results for input(s): CHOL, HDL, LDLCALC, TRIG, CHOLHDL, LDLDIRECT in the last 72 hours.  Lab Results  Component Value Date   HGBA1C 6.9 (H) 02/12/2021   ------------------------------------------------------------------------------------------------------------------ No results for input(s): TSH, T4TOTAL, T3FREE, THYROIDAB in the last 72 hours.  Invalid input(s): FREET3   Cardiac Enzymes No results for input(s): CKMB, TROPONINI, MYOGLOBIN in the last 168 hours.  Invalid input(s): CK ------------------------------------------------------------------------------------------------------------------    Component Value Date/Time   BNP 247.5 (H) 02/16/2021 0354     Radiology Reports CT HEAD WO CONTRAST (5MM)  Result Date: 02/11/2021 CLINICAL DATA:  Weakness, fatigue EXAM: CT HEAD WITHOUT CONTRAST TECHNIQUE: Contiguous axial images were obtained from the base of the skull through the vertex without intravenous contrast.  COMPARISON:  10/18/2018 FINDINGS: Brain: Chronic small vessel ischemic changes are again seen throughout the periventricular white matter, stable. No signs of acute infarct or hemorrhage. Lateral ventricles and remaining midline structures are unremarkable. There are no acute extra-axial fluid collections. No mass effect. Vascular: No hyperdense vessel or unexpected calcification. Skull: Normal. Negative for fracture or focal lesion. Sinuses/Orbits: Mild mucosal thickening within the ethmoid air cells. Remaining paranasal sinuses are clear. Other: None. IMPRESSION: 1. Stable chronic ischemic changes as above. No acute intracranial process. Electronically Signed   By: Randa Ngo M.D.   On: 02/11/2021 20:44   MR BRAIN WO CONTRAST  Result Date: 02/12/2021 CLINICAL DATA:  Acute neurologic deficit EXAM: MRI HEAD WITHOUT CONTRAST TECHNIQUE: Multiplanar, multiecho pulse sequences of the brain and surrounding structures were obtained without intravenous contrast. COMPARISON:  None. FINDINGS: Brain: No acute infarct, mass effect or extra-axial collection. No  acute or chronic hemorrhage. There is multifocal hyperintense T2-weighted signal within the white matter. Diffuse, severe atrophy. The midline structures are normal. Vascular: Major flow voids are preserved. Skull and upper cervical spine: Normal calvarium and skull base. Visualized upper cervical spine and soft tissues are normal. Sinuses/Orbits:No paranasal sinus fluid levels or advanced mucosal thickening. No mastoid or middle ear effusion. Normal orbits. IMPRESSION: 1. No acute intracranial abnormality. 2. Diffuse, severe atrophy and findings of chronic microvascular ischemia. Electronically Signed   By: Ulyses Jarred M.D.   On: 02/12/2021 23:33   CT ABDOMEN PELVIS W CONTRAST  Result Date: 02/11/2021 CLINICAL DATA:  Nausea, vomiting, anorexia, oliguria. Abdominal pain and back pain. EXAM: CT ABDOMEN AND PELVIS WITH CONTRAST TECHNIQUE: Multidetector CT  imaging of the abdomen and pelvis was performed using the standard protocol following bolus administration of intravenous contrast. CONTRAST:  70mL OMNIPAQUE IOHEXOL 350 MG/ML SOLN COMPARISON:  07/12/2020 FINDINGS: Lower chest: Patchy infiltrate is seen within the a posterior basal right lower lobe, possibly infectious or inflammatory in nature. Mild left basilar atelectasis. No pleural effusion. Cardiac size within normal limits. No pericardial effusion. Hepatobiliary: No focal liver abnormality is seen. Status post cholecystectomy. No biliary dilatation. Pancreas: Unremarkable Spleen: Unremarkable Adrenals/Urinary Tract: The adrenal glands are unremarkable. The kidneys are normal in size and position. Stable 7 mm nonobstructing calculus within the lower pole the left kidney. No ureteral calculi. No hydronephrosis. Multiple simple bilateral cortical cysts are again identified. There is a stable 20 mm partially exophytic low-attenuation lesion arising from the upper pole of the right kidney which may represent a hyperdense renal cyst or cystic renal mass, but is not well characterized on this single-phase examination. This appears stable, however, since remote prior examination of 01/22/2015 and its stability over time favors a benign lesion. The bladder is unremarkable. Stomach/Bowel: Stomach is within normal limits. Appendix appears normal. No evidence of bowel wall thickening, distention, or inflammatory changes. Vascular/Lymphatic: Aortic atherosclerosis. No enlarged abdominal or pelvic lymph nodes. Reproductive: Prostatectomy and pelvic lymph node dissection has been performed. No recurrent mass lesion. Other: No abdominal wall hernia.  Rectum unremarkable. Musculoskeletal: Bilateral hip ORIF has been performed. Healed fractures the right superior and inferior pubic rami noted. Degenerative changes are seen within the lumbar spine. No acute bone abnormality. No lytic or blastic bone lesion. IMPRESSION: No acute  intra-abdominal pathology identified. No definite radiographic explanation for the patient's reported symptoms. Patchy infiltrate within the visualized right lung base, possibly infectious or inflammatory. Stable mild left nonobstructing nephrolithiasis. Indeterminate low-attenuation exophytic lesion arising from the upper pole of the right kidney, stable since prior examination of 01/22/2015. Its stability over time, however, favors a hyperdense renal cyst. Aortic Atherosclerosis (ICD10-I70.0). Electronically Signed   By: Fidela Salisbury M.D.   On: 02/11/2021 20:55   DG Chest Port 1 View  Result Date: 02/18/2021 CLINICAL DATA:  87 year old male with shortness of breath. Right upper lobe pneumonia. EXAM: PORTABLE CHEST 1 VIEW COMPARISON:  Portable chest 02/15/2021 and earlier. FINDINGS: Portable AP upright view at 1109 hours. Partial clearing of the asymmetric right upper lobe opacity which continues to abut the minor fissure. Mildly lower lung volumes. Mediastinal contours remain normal. Visualized tracheal air column is within normal limits. No pneumothorax, pulmonary edema, pleural effusion or new pulmonary opacity. Visible bowel-gas pattern within normal limits. Cholecystectomy clips. Osteopenia. No acute osseous abnormality identified. IMPRESSION: Partial clearing of the right upper lobe pneumonia. No new cardiopulmonary abnormality. Electronically Signed   By: Herminio Heads.D.  On: 02/18/2021 11:35   DG Chest Port 1 View  Result Date: 02/15/2021 CLINICAL DATA:  Shortness of breath EXAM: PORTABLE CHEST 1 VIEW COMPARISON:  Four days ago FINDINGS: Right upper lobe airspace disease. There is no edema, effusion, or pneumothorax. Normal heart size and stable mediastinal contours. IMPRESSION: Unchanged right upper lobe pneumonia. Electronically Signed   By: Jorje Guild M.D.   On: 02/15/2021 08:09   DG Chest Portable 1 View  Result Date: 02/11/2021 CLINICAL DATA:  Weakness, chest pain EXAM: PORTABLE  CHEST 1 VIEW COMPARISON:  Chest x-ray 10/14/2018 FINDINGS: The heart and mediastinal contours are within normal limits. Vague right perihilar airspace density may be due to rotation. No pulmonary edema. No pleural effusion. No pneumothorax. No acute osseous abnormality. IMPRESSION: Low lung volumes with vague right perihilar airspace density may be due to rotation. Otherwise no acute cardiopulmonary abnormality. Electronically Signed   By: Iven Finn M.D.   On: 02/11/2021 19:15   CT HEAD CODE STROKE WO CONTRAST  Result Date: 02/12/2021 CLINICAL DATA:  Facial droop and dysarthria EXAM: CT HEAD WITHOUT CONTRAST CT ANGIOGRAPHY OF THE HEAD AND NECK TECHNIQUE: Contiguous axial images were obtained from the base of the skull through the vertex without intravenous contrast. Multidetector CT imaging of the head and neck was performed using the standard protocol during bolus administration of intravenous contrast. Multiplanar CT image reconstructions and MIPs were obtained to evaluate the vascular anatomy. Carotid stenosis measurements (when applicable) are obtained utilizing NASCET criteria, using the distal internal carotid diameter as the denominator. CONTRAST:  31mL OMNIPAQUE IOHEXOL 350 MG/ML SOLN COMPARISON:  None. FINDINGS: CT HEAD FINDINGS Brain: There is no mass, hemorrhage or extra-axial collection. There is generalized atrophy without lobar predilection. There is hypoattenuation of the periventricular white matter, most commonly indicating chronic ischemic microangiopathy. Vascular: No abnormal hyperdensity of the major intracranial arteries or dural venous sinuses. No intracranial atherosclerosis. Skull: The visualized skull base, calvarium and extracranial soft tissues are normal. Sinuses/Orbits: No fluid levels or advanced mucosal thickening of the visualized paranasal sinuses. No mastoid or middle ear effusion. The orbits are normal. ASPECTS ([place] Stroke Program Early CT Score) - Ganglionic level  infarction (caudate, lentiform nuclei, internal capsule, insula, M1-M3 cortex): 7 - Supraganglionic infarction (M4-M6 cortex): 3 Total score (0-10 with 10 being normal): 10 CTA NECK FINDINGS SKELETON: There is no bony spinal canal stenosis. No lytic or blastic lesion. OTHER NECK: Subcentimeter right intraparotid lymph node. Normal left parotid gland. 8 mm right submandibular lymph node. Submandibular glands are normal. UPPER CHEST: Right upper lobe consolidation. AORTIC ARCH: There is no calcific atherosclerosis of the aortic arch. There is no aneurysm, dissection or hemodynamically significant stenosis of the visualized portion of the aorta. Conventional 3 vessel aortic branching pattern. The visualized proximal subclavian arteries are widely patent. RIGHT CAROTID SYSTEM: Normal without aneurysm, dissection or stenosis. LEFT CAROTID SYSTEM: Normal without aneurysm, dissection or stenosis. VERTEBRAL ARTERIES: Left dominant configuration. Both origins are clearly patent. There is no dissection, occlusion or flow-limiting stenosis to the skull base (V1-V3 segments). CTA HEAD FINDINGS POSTERIOR CIRCULATION: --Vertebral arteries: Normal V4 segments. --Inferior cerebellar arteries: Normal. --Basilar artery: Normal. --Superior cerebellar arteries: Normal. --Posterior cerebral arteries (PCA): Normal. ANTERIOR CIRCULATION: --Intracranial internal carotid arteries: Normal. --Anterior cerebral arteries (ACA): Normal. Both A1 segments are present. Patent anterior communicating artery (a-comm). --Middle cerebral arteries (MCA): Normal. VENOUS SINUSES: As permitted by contrast timing, patent. ANATOMIC VARIANTS: None Review of the MIP images confirms the above findings. IMPRESSION: 1. No emergent large  vessel occlusion or high-grade stenosis of the head or neck. 2. Right upper lobe consolidation, concerning for pneumonia. These results were called by telephone at the time of interpretation on 02/12/2021 at 9:14 pm to provider  [facility] , who verbally acknowledged these results. Electronically Signed   By: Ulyses Jarred M.D.   On: 02/12/2021 21:14   CT ANGIO HEAD CODE STROKE  Result Date: 02/12/2021 CLINICAL DATA:  Facial droop and dysarthria EXAM: CT HEAD WITHOUT CONTRAST CT ANGIOGRAPHY OF THE HEAD AND NECK TECHNIQUE: Contiguous axial images were obtained from the base of the skull through the vertex without intravenous contrast. Multidetector CT imaging of the head and neck was performed using the standard protocol during bolus administration of intravenous contrast. Multiplanar CT image reconstructions and MIPs were obtained to evaluate the vascular anatomy. Carotid stenosis measurements (when applicable) are obtained utilizing NASCET criteria, using the distal internal carotid diameter as the denominator. CONTRAST:  51mL OMNIPAQUE IOHEXOL 350 MG/ML SOLN COMPARISON:  None. FINDINGS: CT HEAD FINDINGS Brain: There is no mass, hemorrhage or extra-axial collection. There is generalized atrophy without lobar predilection. There is hypoattenuation of the periventricular white matter, most commonly indicating chronic ischemic microangiopathy. Vascular: No abnormal hyperdensity of the major intracranial arteries or dural venous sinuses. No intracranial atherosclerosis. Skull: The visualized skull base, calvarium and extracranial soft tissues are normal. Sinuses/Orbits: No fluid levels or advanced mucosal thickening of the visualized paranasal sinuses. No mastoid or middle ear effusion. The orbits are normal. ASPECTS ([place] Stroke Program Early CT Score) - Ganglionic level infarction (caudate, lentiform nuclei, internal capsule, insula, M1-M3 cortex): 7 - Supraganglionic infarction (M4-M6 cortex): 3 Total score (0-10 with 10 being normal): 10 CTA NECK FINDINGS SKELETON: There is no bony spinal canal stenosis. No lytic or blastic lesion. OTHER NECK: Subcentimeter right intraparotid lymph node. Normal left parotid gland. 8 mm right  submandibular lymph node. Submandibular glands are normal. UPPER CHEST: Right upper lobe consolidation. AORTIC ARCH: There is no calcific atherosclerosis of the aortic arch. There is no aneurysm, dissection or hemodynamically significant stenosis of the visualized portion of the aorta. Conventional 3 vessel aortic branching pattern. The visualized proximal subclavian arteries are widely patent. RIGHT CAROTID SYSTEM: Normal without aneurysm, dissection or stenosis. LEFT CAROTID SYSTEM: Normal without aneurysm, dissection or stenosis. VERTEBRAL ARTERIES: Left dominant configuration. Both origins are clearly patent. There is no dissection, occlusion or flow-limiting stenosis to the skull base (V1-V3 segments). CTA HEAD FINDINGS POSTERIOR CIRCULATION: --Vertebral arteries: Normal V4 segments. --Inferior cerebellar arteries: Normal. --Basilar artery: Normal. --Superior cerebellar arteries: Normal. --Posterior cerebral arteries (PCA): Normal. ANTERIOR CIRCULATION: --Intracranial internal carotid arteries: Normal. --Anterior cerebral arteries (ACA): Normal. Both A1 segments are present. Patent anterior communicating artery (a-comm). --Middle cerebral arteries (MCA): Normal. VENOUS SINUSES: As permitted by contrast timing, patent. ANATOMIC VARIANTS: None Review of the MIP images confirms the above findings. IMPRESSION: 1. No emergent large vessel occlusion or high-grade stenosis of the head or neck. 2. Right upper lobe consolidation, concerning for pneumonia. These results were called by telephone at the time of interpretation on 02/12/2021 at 9:14 pm to provider [facility] , who verbally acknowledged these results. Electronically Signed   By: Ulyses Jarred M.D.   On: 02/12/2021 21:14   CT ANGIO NECK CODE STROKE  Result Date: 02/12/2021 CLINICAL DATA:  Facial droop and dysarthria EXAM: CT HEAD WITHOUT CONTRAST CT ANGIOGRAPHY OF THE HEAD AND NECK TECHNIQUE: Contiguous axial images were obtained from the base of  the skull through the vertex without intravenous  contrast. Multidetector CT imaging of the head and neck was performed using the standard protocol during bolus administration of intravenous contrast. Multiplanar CT image reconstructions and MIPs were obtained to evaluate the vascular anatomy. Carotid stenosis measurements (when applicable) are obtained utilizing NASCET criteria, using the distal internal carotid diameter as the denominator. CONTRAST:  52mL OMNIPAQUE IOHEXOL 350 MG/ML SOLN COMPARISON:  None. FINDINGS: CT HEAD FINDINGS Brain: There is no mass, hemorrhage or extra-axial collection. There is generalized atrophy without lobar predilection. There is hypoattenuation of the periventricular white matter, most commonly indicating chronic ischemic microangiopathy. Vascular: No abnormal hyperdensity of the major intracranial arteries or dural venous sinuses. No intracranial atherosclerosis. Skull: The visualized skull base, calvarium and extracranial soft tissues are normal. Sinuses/Orbits: No fluid levels or advanced mucosal thickening of the visualized paranasal sinuses. No mastoid or middle ear effusion. The orbits are normal. ASPECTS ([place] Stroke Program Early CT Score) - Ganglionic level infarction (caudate, lentiform nuclei, internal capsule, insula, M1-M3 cortex): 7 - Supraganglionic infarction (M4-M6 cortex): 3 Total score (0-10 with 10 being normal): 10 CTA NECK FINDINGS SKELETON: There is no bony spinal canal stenosis. No lytic or blastic lesion. OTHER NECK: Subcentimeter right intraparotid lymph node. Normal left parotid gland. 8 mm right submandibular lymph node. Submandibular glands are normal. UPPER CHEST: Right upper lobe consolidation. AORTIC ARCH: There is no calcific atherosclerosis of the aortic arch. There is no aneurysm, dissection or hemodynamically significant stenosis of the visualized portion of the aorta. Conventional 3 vessel aortic branching pattern. The visualized proximal  subclavian arteries are widely patent. RIGHT CAROTID SYSTEM: Normal without aneurysm, dissection or stenosis. LEFT CAROTID SYSTEM: Normal without aneurysm, dissection or stenosis. VERTEBRAL ARTERIES: Left dominant configuration. Both origins are clearly patent. There is no dissection, occlusion or flow-limiting stenosis to the skull base (V1-V3 segments). CTA HEAD FINDINGS POSTERIOR CIRCULATION: --Vertebral arteries: Normal V4 segments. --Inferior cerebellar arteries: Normal. --Basilar artery: Normal. --Superior cerebellar arteries: Normal. --Posterior cerebral arteries (PCA): Normal. ANTERIOR CIRCULATION: --Intracranial internal carotid arteries: Normal. --Anterior cerebral arteries (ACA): Normal. Both A1 segments are present. Patent anterior communicating artery (a-comm). --Middle cerebral arteries (MCA): Normal. VENOUS SINUSES: As permitted by contrast timing, patent. ANATOMIC VARIANTS: None Review of the MIP images confirms the above findings. IMPRESSION: 1. No emergent large vessel occlusion or high-grade stenosis of the head or neck. 2. Right upper lobe consolidation, concerning for pneumonia. These results were called by telephone at the time of interpretation on 02/12/2021 at 9:14 pm to provider [employer] , who verbally acknowledged these results. Electronically Signed   By: Ulyses Jarred M.D.   On: 02/12/2021 21:14

## 2021-02-22 NOTE — Progress Notes (Signed)
Told pt I talked with his son Mats Jeanlouis and he is going to talk to the others but Tyler [name] will be here later today (this morning). Pt stated Oh hell I'll be dead by then.

## 2021-02-22 NOTE — Progress Notes (Addendum)
Pt has been staring out into space with decrease verbal response. Pt did this last night and the nurse said he was back to his normal self around 0800. Last night though he wouldn't talk to me. Tonight he is talking and squeezed my hands. He keeps saying over and over that he is going to die tonight and soon and he requested that I call his wife and sons to have them come up here soon so he can see them before he dies. I asked him what makes him think he's going to die tonight and soon and he said I can feel it. I'm not going to make it out of here, now go and call my family. I called his son Richardson Landry and left a msg for him to call me and then I called his son Krystopher Kuenzel. And talked to him and told him what was going on and he said Richardson Landry would be up this morning but if anything changes call him. Truddie Crumble. Stated not to call his mom at this time but if I call him then call his mom. Pt has been doing this for now approx 1 hour and this is about the time he started it last night. Phyl RN CN notified.

## 2021-02-23 LAB — GLUCOSE, CAPILLARY
Glucose-Capillary: 177 mg/dL — ABNORMAL HIGH (ref 70–99)
Glucose-Capillary: 290 mg/dL — ABNORMAL HIGH (ref 70–99)
Glucose-Capillary: 203 mg/dL — ABNORMAL HIGH (ref 70–99)
Glucose-Capillary: 185 mg/dL — ABNORMAL HIGH (ref 70–99)

## 2021-02-23 NOTE — TOC Progression Note (Signed)
Transition of Care [facility]) - Progression Note    Patient Details  Name: Tyler [name] MRN: [id number] Date of Birth: Jul 29, 1928  [facility]) CM/SW [place], [place] Phone Number: 02/23/2021, 9:09 AM  Clinical Narrative:    CSW confirmed with MD that the plan is for pt to DC tomorrow to SNF. CSW requested covid test and MD noted he would order it. TOC will continue to follow for DC needs.   Expected Discharge Plan: [place] Barriers to Discharge: Continued Medical Work up, Ship broker  Expected Discharge Plan and Services Expected Discharge Plan: [place] Acute [facility] Choice: NA Living arrangements for the past 2 months: Single Family Home Expected Discharge Date: 02/18/21                                     Social Determinants of Health (SDOH) Interventions    Readmission Risk Interventions No flowsheet data found.

## 2021-02-23 NOTE — Progress Notes (Addendum)
PROGRESS NOTE                                                                                                                                                                                                             Patient Demographics:    Tyler [name], is a 87 y.o. male, DOB - [birth year], YDX:[id number]  Outpatient Primary MD for the patient is Tyler Pretty, MD    LOS - 12  Admit date - 02/11/2021    Chief Complaint  Patient presents with   Weakness       Brief Narrative (HPI from H&P)     Tyler [name] is a 87 y.o. male with medical history significant for type 2 diabetes mellitus, essential hypertension, hyperlipidemia, stage IIIa chronic kidney disease with baseline creatinine reported 1.7, anemia of chronic disease with baseline hemoglobin 9-12, who presented from home to [facility] ED for evaluation of altered mental status, had Asp. PNA.   Subjective:   No significant events overnight as discussed with staff, he had a good sleep, patient's appetite has improved.      Assessment  & Plan :    Acute Toxic encephalopathy due to aspiration pneumonia and now with delirium  - had detailed discussion with patient's son and life, currently has been having problems swallowing food or liquids for several weeks, post IVF and Unasyn, Speech following.  Head CT & MRI are unremarkable and he has no focal neurological deficits.  He does not have sepsis.  Will monitor closely, minimized benzodiazepines and narcotics, nighttime Seroquel and as needed Haldol, much better, awaiting bed availability. -Mentation continues to improve, he has some episodes of delirium/lethargy, but it is much less intense and frequent. -As well he is less lethargic in a.m. after giving his Seroquel earlier in the evening at 8 PM.  Weakness and deconditioning  -PT/OT following, plan for subacute rehab  Insignificant mild elevation in troponin patient ACS  pattern.  Chest pain-free.  EKG is nonacute, continue statin and Plavix for secondary prevention.  No further work-up.  Mild troponin rise likely underlying infection.  Essential hypertension.  On Norvasc + Coreg.  Dyslipidemia.  Statin.  CKD 3B.  Baseline creatinine around 1.5.  Stable.  Monitor with gentle hydration.  GERD.  Placed on Pepcid.  AOCD.  Stable no acute issue.  History of CVA in  the past.  No new focal deficits, head CT stable, no headache, continue Plavix and statin for secondary prevention.  Hypomagnesemia.  Replaced.    DM type II.  Sliding scale.  This SmartLink has not been configured with any valid records.   CBG (last 3)  Recent Labs    02/22/21 2144 02/23/21 0622 02/23/21 1200  GLUCAP 176* 177* 290*         Condition -  Guarded  Family Communication  :   None   at bedside.   Code Status :  DNR  Consults  :  None  PUD Prophylaxis :    Procedures  :     MRI brain - Non acute  CT Head - 1. Stable chronic ischemic changes as above. No acute intracranial process  CT ABD  - Pelvis - No acute intra-abdominal pathology identified. No definite radiographic explanation for the patient's reported symptoms. Patchy infiltrate within the visualized right lung base, possibly infectious or inflammatory. Stable mild left nonobstructing nephrolithiasis. Indeterminate low-attenuation exophytic lesion arising from the upper pole of the right kidney, stable since prior examination of 01/22/2015. Its stability over time, however, favors a hyperdense renal cyst. Aortic Atherosclerosis (ICD10-I70.0).      Disposition Plan  :    Status is: Inpatient  Remains inpatient appropriate because:Altered mental status and IV treatments appropriate due to intensity of illness or inability to take PO  Dispo: The patient is from: Home              Anticipated d/c is to: SNF              Patient currently is medically stable to d/c.  Patient is medically ready for  discharge, awaiting SNF bed availability.   Difficult to place patient No  DVT Prophylaxis  :    enoxaparin (LOVENOX) injection 40 mg Start: 02/18/21 1600 SCDs Start: 02/11/21 2144  Lab Results  Component Value Date   PLT 223 02/20/2021    Diet :  Diet Order             Diet regular Room service appropriate? Yes with Assist; Fluid consistency: Thin  Diet effective now                    Inpatient Medications  Scheduled Meds:  amLODipine  10 mg Oral Daily   atorvastatin  40 mg Oral Daily   carvedilol  3.125 mg Oral BID WC   clopidogrel  75 mg Oral Q breakfast   docusate sodium  100 mg Oral QHS   enoxaparin (LOVENOX) injection  40 mg Subcutaneous Q24H   famotidine  20 mg Oral Daily   feeding supplement  237 mL Oral BID BM   insulin aspart  0-6 Units Subcutaneous TID WC   QUEtiapine  25 mg Oral QHS   vitamin B-12  1,000 mcg Oral QHS   Continuous Infusions:   PRN Meds:.acetaminophen **OR** acetaminophen, haloperidol lactate, hydrALAZINE, phenol, polyethylene glycol   Tyler [name] M.D on 02/23/2021 at 3:48 PM  To page go to [email or web address]   [facility] -  Office  [phone]  See all Orders from today for further details    Objective:   Vitals:   02/22/21 2343 02/23/21 0315 02/23/21 0728 02/23/21 1158  BP: (!) 141/51 (!) 152/58 (!) 165/62 (!) 151/59  Pulse: 64 66 70 75  Resp: 19 18 14 14   Temp: 97.6 F (36.4 C) 98.4 F (36.9 C) 98.2 F (36.8 C) 97.7 F (36.5  C)  TempSrc: Axillary Axillary Oral Oral  SpO2: 98% 98% 98% 96%  Weight:  73.5 kg    Height:        Wt Readings from Last 3 Encounters:  02/23/21 73.5 kg  10/12/18 70.3 kg  07/11/18 74.4 kg     Intake/Output Summary (Last 24 hours) at 02/23/2021 1548 Last data filed at 02/23/2021 1030 Gross per 24 hour  Intake 336 ml  Output 450 ml  Net -114 ml      Physical Exam  Awake Alert, Oriented X 1,  Symmetrical Chest wall movement, Good air movement bilaterally,  CTAB RRR,No Gallops,Rubs or new Murmurs, No Parasternal Heave +ve B.Sounds, Abd Soft, No tenderness, No rebound - guarding or rigidity. No Cyanosis, Clubbing or edema, No new Rash or bruise       Data Review:    CBC Recent Labs  Lab 02/20/21 0246  WBC 7.6  HGB 8.7*  HCT 25.9*  PLT 223  MCV 97.7  MCH 32.8  MCHC 33.6  RDW 13.4    Recent Labs  Lab 02/20/21 0246  NA 137  K 3.8  CL 104  CO2 24  GLUCOSE 146*  BUN 22  CREATININE 1.49*  CALCIUM 9.0    ------------------------------------------------------------------------------------------------------------------ No results for input(s): CHOL, HDL, LDLCALC, TRIG, CHOLHDL, LDLDIRECT in the last 72 hours.  Lab Results  Component Value Date   HGBA1C 6.9 (H) 02/12/2021   ------------------------------------------------------------------------------------------------------------------ No results for input(s): TSH, T4TOTAL, T3FREE, THYROIDAB in the last 72 hours.  Invalid input(s): FREET3   Cardiac Enzymes No results for input(s): CKMB, TROPONINI, MYOGLOBIN in the last 168 hours.  Invalid input(s): CK ------------------------------------------------------------------------------------------------------------------    Component Value Date/Time   BNP 247.5 (H) 02/16/2021 0354     Radiology Reports CT HEAD WO CONTRAST (5MM)  Result Date: 02/11/2021 CLINICAL DATA:  Weakness, fatigue EXAM: CT HEAD WITHOUT CONTRAST TECHNIQUE: Contiguous axial images were obtained from the base of the skull through the vertex without intravenous contrast. COMPARISON:  10/18/2018 FINDINGS: Brain: Chronic small vessel ischemic changes are again seen throughout the periventricular white matter, stable. No signs of acute infarct or hemorrhage. Lateral ventricles and remaining midline structures are unremarkable. There are no acute extra-axial fluid collections. No mass effect. Vascular: No hyperdense vessel or unexpected calcification. Skull:  Normal. Negative for fracture or focal lesion. Sinuses/Orbits: Mild mucosal thickening within the ethmoid air cells. Remaining paranasal sinuses are clear. Other: None. IMPRESSION: 1. Stable chronic ischemic changes as above. No acute intracranial process. Electronically Signed   By: Randa Ngo M.D.   On: 02/11/2021 20:44   MR BRAIN WO CONTRAST  Result Date: 02/12/2021 CLINICAL DATA:  Acute neurologic deficit EXAM: MRI HEAD WITHOUT CONTRAST TECHNIQUE: Multiplanar, multiecho pulse sequences of the brain and surrounding structures were obtained without intravenous contrast. COMPARISON:  None. FINDINGS: Brain: No acute infarct, mass effect or extra-axial collection. No acute or chronic hemorrhage. There is multifocal hyperintense T2-weighted signal within the white matter. Diffuse, severe atrophy. The midline structures are normal. Vascular: Major flow voids are preserved. Skull and upper cervical spine: Normal calvarium and skull base. Visualized upper cervical spine and soft tissues are normal. Sinuses/Orbits:No paranasal sinus fluid levels or advanced mucosal thickening. No mastoid or middle ear effusion. Normal orbits. IMPRESSION: 1. No acute intracranial abnormality. 2. Diffuse, severe atrophy and findings of chronic microvascular ischemia. Electronically Signed   By: Ulyses Jarred M.D.   On: 02/12/2021 23:33   CT ABDOMEN PELVIS W CONTRAST  Result Date: 02/11/2021 CLINICAL DATA:  Nausea, vomiting, anorexia, oliguria. Abdominal pain and back pain. EXAM: CT ABDOMEN AND PELVIS WITH CONTRAST TECHNIQUE: Multidetector CT imaging of the abdomen and pelvis was performed using the standard protocol following bolus administration of intravenous contrast. CONTRAST:  66mL OMNIPAQUE IOHEXOL 350 MG/ML SOLN COMPARISON:  07/12/2020 FINDINGS: Lower chest: Patchy infiltrate is seen within the a posterior basal right lower lobe, possibly infectious or inflammatory in nature. Mild left basilar atelectasis. No pleural  effusion. Cardiac size within normal limits. No pericardial effusion. Hepatobiliary: No focal liver abnormality is seen. Status post cholecystectomy. No biliary dilatation. Pancreas: Unremarkable Spleen: Unremarkable Adrenals/Urinary Tract: The adrenal glands are unremarkable. The kidneys are normal in size and position. Stable 7 mm nonobstructing calculus within the lower pole the left kidney. No ureteral calculi. No hydronephrosis. Multiple simple bilateral cortical cysts are again identified. There is a stable 20 mm partially exophytic low-attenuation lesion arising from the upper pole of the right kidney which may represent a hyperdense renal cyst or cystic renal mass, but is not well characterized on this single-phase examination. This appears stable, however, since remote prior examination of 01/22/2015 and its stability over time favors a benign lesion. The bladder is unremarkable. Stomach/Bowel: Stomach is within normal limits. Appendix appears normal. No evidence of bowel wall thickening, distention, or inflammatory changes. Vascular/Lymphatic: Aortic atherosclerosis. No enlarged abdominal or pelvic lymph nodes. Reproductive: Prostatectomy and pelvic lymph node dissection has been performed. No recurrent mass lesion. Other: No abdominal wall hernia.  Rectum unremarkable. Musculoskeletal: Bilateral hip ORIF has been performed. Healed fractures the right superior and inferior pubic rami noted. Degenerative changes are seen within the lumbar spine. No acute bone abnormality. No lytic or blastic bone lesion. IMPRESSION: No acute intra-abdominal pathology identified. No definite radiographic explanation for the patient's reported symptoms. Patchy infiltrate within the visualized right lung base, possibly infectious or inflammatory. Stable mild left nonobstructing nephrolithiasis. Indeterminate low-attenuation exophytic lesion arising from the upper pole of the right kidney, stable since prior examination of  01/22/2015. Its stability over time, however, favors a hyperdense renal cyst. Aortic Atherosclerosis (ICD10-I70.0). Electronically Signed   By: Fidela Salisbury M.D.   On: 02/11/2021 20:55   DG Chest Port 1 View  Result Date: 02/18/2021 CLINICAL DATA:  87 year old male with shortness of breath. Right upper lobe pneumonia. EXAM: PORTABLE CHEST 1 VIEW COMPARISON:  Portable chest 02/15/2021 and earlier. FINDINGS: Portable AP upright view at 1109 hours. Partial clearing of the asymmetric right upper lobe opacity which continues to abut the minor fissure. Mildly lower lung volumes. Mediastinal contours remain normal. Visualized tracheal air column is within normal limits. No pneumothorax, pulmonary edema, pleural effusion or new pulmonary opacity. Visible bowel-gas pattern within normal limits. Cholecystectomy clips. Osteopenia. No acute osseous abnormality identified. IMPRESSION: Partial clearing of the right upper lobe pneumonia. No new cardiopulmonary abnormality. Electronically Signed   By: Genevie Ann M.D.   On: 02/18/2021 11:35   DG Chest Port 1 View  Result Date: 02/15/2021 CLINICAL DATA:  Shortness of breath EXAM: PORTABLE CHEST 1 VIEW COMPARISON:  Four days ago FINDINGS: Right upper lobe airspace disease. There is no edema, effusion, or pneumothorax. Normal heart size and stable mediastinal contours. IMPRESSION: Unchanged right upper lobe pneumonia. Electronically Signed   By: Jorje Guild M.D.   On: 02/15/2021 08:09   DG Chest Portable 1 View  Result Date: 02/11/2021 CLINICAL DATA:  Weakness, chest pain EXAM: PORTABLE CHEST 1 VIEW COMPARISON:  Chest x-ray 10/14/2018 FINDINGS: The heart and mediastinal contours are within normal limits.  Vague right perihilar airspace density may be due to rotation. No pulmonary edema. No pleural effusion. No pneumothorax. No acute osseous abnormality. IMPRESSION: Low lung volumes with vague right perihilar airspace density may be due to rotation. Otherwise no acute  cardiopulmonary abnormality. Electronically Signed   By: Iven Finn M.D.   On: 02/11/2021 19:15   CT HEAD CODE STROKE WO CONTRAST  Result Date: 02/12/2021 CLINICAL DATA:  Facial droop and dysarthria EXAM: CT HEAD WITHOUT CONTRAST CT ANGIOGRAPHY OF THE HEAD AND NECK TECHNIQUE: Contiguous axial images were obtained from the base of the skull through the vertex without intravenous contrast. Multidetector CT imaging of the head and neck was performed using the standard protocol during bolus administration of intravenous contrast. Multiplanar CT image reconstructions and MIPs were obtained to evaluate the vascular anatomy. Carotid stenosis measurements (when applicable) are obtained utilizing NASCET criteria, using the distal internal carotid diameter as the denominator. CONTRAST:  6mL OMNIPAQUE IOHEXOL 350 MG/ML SOLN COMPARISON:  None. FINDINGS: CT HEAD FINDINGS Brain: There is no mass, hemorrhage or extra-axial collection. There is generalized atrophy without lobar predilection. There is hypoattenuation of the periventricular white matter, most commonly indicating chronic ischemic microangiopathy. Vascular: No abnormal hyperdensity of the major intracranial arteries or dural venous sinuses. No intracranial atherosclerosis. Skull: The visualized skull base, calvarium and extracranial soft tissues are normal. Sinuses/Orbits: No fluid levels or advanced mucosal thickening of the visualized paranasal sinuses. No mastoid or middle ear effusion. The orbits are normal. ASPECTS ([place] Stroke Program Early CT Score) - Ganglionic level infarction (caudate, lentiform nuclei, internal capsule, insula, M1-M3 cortex): 7 - Supraganglionic infarction (M4-M6 cortex): 3 Total score (0-10 with 10 being normal): 10 CTA NECK FINDINGS SKELETON: There is no bony spinal canal stenosis. No lytic or blastic lesion. OTHER NECK: Subcentimeter right intraparotid lymph node. Normal left parotid gland. 8 mm right submandibular lymph  node. Submandibular glands are normal. UPPER CHEST: Right upper lobe consolidation. AORTIC ARCH: There is no calcific atherosclerosis of the aortic arch. There is no aneurysm, dissection or hemodynamically significant stenosis of the visualized portion of the aorta. Conventional 3 vessel aortic branching pattern. The visualized proximal subclavian arteries are widely patent. RIGHT CAROTID SYSTEM: Normal without aneurysm, dissection or stenosis. LEFT CAROTID SYSTEM: Normal without aneurysm, dissection or stenosis. VERTEBRAL ARTERIES: Left dominant configuration. Both origins are clearly patent. There is no dissection, occlusion or flow-limiting stenosis to the skull base (V1-V3 segments). CTA HEAD FINDINGS POSTERIOR CIRCULATION: --Vertebral arteries: Normal V4 segments. --Inferior cerebellar arteries: Normal. --Basilar artery: Normal. --Superior cerebellar arteries: Normal. --Posterior cerebral arteries (PCA): Normal. ANTERIOR CIRCULATION: --Intracranial internal carotid arteries: Normal. --Anterior cerebral arteries (ACA): Normal. Both A1 segments are present. Patent anterior communicating artery (a-comm). --Middle cerebral arteries (MCA): Normal. VENOUS SINUSES: As permitted by contrast timing, patent. ANATOMIC VARIANTS: None Review of the MIP images confirms the above findings. IMPRESSION: 1. No emergent large vessel occlusion or high-grade stenosis of the head or neck. 2. Right upper lobe consolidation, concerning for pneumonia. These results were called by telephone at the time of interpretation on 02/12/2021 at 9:14 pm to provider [facility] , who verbally acknowledged these results. Electronically Signed   By: Ulyses Jarred M.D.   On: 02/12/2021 21:14   CT ANGIO HEAD CODE STROKE  Result Date: 02/12/2021 CLINICAL DATA:  Facial droop and dysarthria EXAM: CT HEAD WITHOUT CONTRAST CT ANGIOGRAPHY OF THE HEAD AND NECK TECHNIQUE: Contiguous axial images were obtained from the base of the skull through the  vertex without intravenous contrast.  Multidetector CT imaging of the head and neck was performed using the standard protocol during bolus administration of intravenous contrast. Multiplanar CT image reconstructions and MIPs were obtained to evaluate the vascular anatomy. Carotid stenosis measurements (when applicable) are obtained utilizing NASCET criteria, using the distal internal carotid diameter as the denominator. CONTRAST:  29mL OMNIPAQUE IOHEXOL 350 MG/ML SOLN COMPARISON:  None. FINDINGS: CT HEAD FINDINGS Brain: There is no mass, hemorrhage or extra-axial collection. There is generalized atrophy without lobar predilection. There is hypoattenuation of the periventricular white matter, most commonly indicating chronic ischemic microangiopathy. Vascular: No abnormal hyperdensity of the major intracranial arteries or dural venous sinuses. No intracranial atherosclerosis. Skull: The visualized skull base, calvarium and extracranial soft tissues are normal. Sinuses/Orbits: No fluid levels or advanced mucosal thickening of the visualized paranasal sinuses. No mastoid or middle ear effusion. The orbits are normal. ASPECTS ([place] Stroke Program Early CT Score) - Ganglionic level infarction (caudate, lentiform nuclei, internal capsule, insula, M1-M3 cortex): 7 - Supraganglionic infarction (M4-M6 cortex): 3 Total score (0-10 with 10 being normal): 10 CTA NECK FINDINGS SKELETON: There is no bony spinal canal stenosis. No lytic or blastic lesion. OTHER NECK: Subcentimeter right intraparotid lymph node. Normal left parotid gland. 8 mm right submandibular lymph node. Submandibular glands are normal. UPPER CHEST: Right upper lobe consolidation. AORTIC ARCH: There is no calcific atherosclerosis of the aortic arch. There is no aneurysm, dissection or hemodynamically significant stenosis of the visualized portion of the aorta. Conventional 3 vessel aortic branching pattern. The visualized proximal subclavian arteries are  widely patent. RIGHT CAROTID SYSTEM: Normal without aneurysm, dissection or stenosis. LEFT CAROTID SYSTEM: Normal without aneurysm, dissection or stenosis. VERTEBRAL ARTERIES: Left dominant configuration. Both origins are clearly patent. There is no dissection, occlusion or flow-limiting stenosis to the skull base (V1-V3 segments). CTA HEAD FINDINGS POSTERIOR CIRCULATION: --Vertebral arteries: Normal V4 segments. --Inferior cerebellar arteries: Normal. --Basilar artery: Normal. --Superior cerebellar arteries: Normal. --Posterior cerebral arteries (PCA): Normal. ANTERIOR CIRCULATION: --Intracranial internal carotid arteries: Normal. --Anterior cerebral arteries (ACA): Normal. Both A1 segments are present. Patent anterior communicating artery (a-comm). --Middle cerebral arteries (MCA): Normal. VENOUS SINUSES: As permitted by contrast timing, patent. ANATOMIC VARIANTS: None Review of the MIP images confirms the above findings. IMPRESSION: 1. No emergent large vessel occlusion or high-grade stenosis of the head or neck. 2. Right upper lobe consolidation, concerning for pneumonia. These results were called by telephone at the time of interpretation on 02/12/2021 at 9:14 pm to provider [facility] , who verbally acknowledged these results. Electronically Signed   By: Ulyses Jarred M.D.   On: 02/12/2021 21:14   CT ANGIO NECK CODE STROKE  Result Date: 02/12/2021 CLINICAL DATA:  Facial droop and dysarthria EXAM: CT HEAD WITHOUT CONTRAST CT ANGIOGRAPHY OF THE HEAD AND NECK TECHNIQUE: Contiguous axial images were obtained from the base of the skull through the vertex without intravenous contrast. Multidetector CT imaging of the head and neck was performed using the standard protocol during bolus administration of intravenous contrast. Multiplanar CT image reconstructions and MIPs were obtained to evaluate the vascular anatomy. Carotid stenosis measurements (when applicable) are obtained utilizing NASCET criteria,  using the distal internal carotid diameter as the denominator. CONTRAST:  1mL OMNIPAQUE IOHEXOL 350 MG/ML SOLN COMPARISON:  None. FINDINGS: CT HEAD FINDINGS Brain: There is no mass, hemorrhage or extra-axial collection. There is generalized atrophy without lobar predilection. There is hypoattenuation of the periventricular white matter, most commonly indicating chronic ischemic microangiopathy. Vascular: No abnormal hyperdensity of the major intracranial arteries  or dural venous sinuses. No intracranial atherosclerosis. Skull: The visualized skull base, calvarium and extracranial soft tissues are normal. Sinuses/Orbits: No fluid levels or advanced mucosal thickening of the visualized paranasal sinuses. No mastoid or middle ear effusion. The orbits are normal. ASPECTS ([place] Stroke Program Early CT Score) - Ganglionic level infarction (caudate, lentiform nuclei, internal capsule, insula, M1-M3 cortex): 7 - Supraganglionic infarction (M4-M6 cortex): 3 Total score (0-10 with 10 being normal): 10 CTA NECK FINDINGS SKELETON: There is no bony spinal canal stenosis. No lytic or blastic lesion. OTHER NECK: Subcentimeter right intraparotid lymph node. Normal left parotid gland. 8 mm right submandibular lymph node. Submandibular glands are normal. UPPER CHEST: Right upper lobe consolidation. AORTIC ARCH: There is no calcific atherosclerosis of the aortic arch. There is no aneurysm, dissection or hemodynamically significant stenosis of the visualized portion of the aorta. Conventional 3 vessel aortic branching pattern. The visualized proximal subclavian arteries are widely patent. RIGHT CAROTID SYSTEM: Normal without aneurysm, dissection or stenosis. LEFT CAROTID SYSTEM: Normal without aneurysm, dissection or stenosis. VERTEBRAL ARTERIES: Left dominant configuration. Both origins are clearly patent. There is no dissection, occlusion or flow-limiting stenosis to the skull base (V1-V3 segments). CTA HEAD FINDINGS POSTERIOR  CIRCULATION: --Vertebral arteries: Normal V4 segments. --Inferior cerebellar arteries: Normal. --Basilar artery: Normal. --Superior cerebellar arteries: Normal. --Posterior cerebral arteries (PCA): Normal. ANTERIOR CIRCULATION: --Intracranial internal carotid arteries: Normal. --Anterior cerebral arteries (ACA): Normal. Both A1 segments are present. Patent anterior communicating artery (a-comm). --Middle cerebral arteries (MCA): Normal. VENOUS SINUSES: As permitted by contrast timing, patent. ANATOMIC VARIANTS: None Review of the MIP images confirms the above findings. IMPRESSION: 1. No emergent large vessel occlusion or high-grade stenosis of the head or neck. 2. Right upper lobe consolidation, concerning for pneumonia. These results were called by telephone at the time of interpretation on 02/12/2021 at 9:14 pm to provider [facility] , who verbally acknowledged these results. Electronically Signed   By: Ulyses Jarred M.D.   On: 02/12/2021 21:14

## 2021-02-24 DIAGNOSIS — E119 Type 2 diabetes mellitus without complications: Secondary | ICD-10-CM | POA: Diagnosis not present

## 2021-02-24 DIAGNOSIS — E7800 Pure hypercholesterolemia, unspecified: Secondary | ICD-10-CM | POA: Diagnosis not present

## 2021-02-24 DIAGNOSIS — R41841 Cognitive communication deficit: Secondary | ICD-10-CM | POA: Diagnosis not present

## 2021-02-24 DIAGNOSIS — E1151 Type 2 diabetes mellitus with diabetic peripheral angiopathy without gangrene: Secondary | ICD-10-CM | POA: Diagnosis not present

## 2021-02-24 DIAGNOSIS — I129 Hypertensive chronic kidney disease with stage 1 through stage 4 chronic kidney disease, or unspecified chronic kidney disease: Secondary | ICD-10-CM | POA: Diagnosis not present

## 2021-02-24 DIAGNOSIS — N1832 Chronic kidney disease, stage 3b: Secondary | ICD-10-CM | POA: Diagnosis not present

## 2021-02-24 DIAGNOSIS — N1831 Chronic kidney disease, stage 3a: Secondary | ICD-10-CM | POA: Diagnosis not present

## 2021-02-24 DIAGNOSIS — M6289 Other specified disorders of muscle: Secondary | ICD-10-CM | POA: Diagnosis not present

## 2021-02-24 DIAGNOSIS — K2100 Gastro-esophageal reflux disease with esophagitis, without bleeding: Secondary | ICD-10-CM | POA: Diagnosis not present

## 2021-02-24 DIAGNOSIS — R6889 Other general symptoms and signs: Secondary | ICD-10-CM | POA: Diagnosis not present

## 2021-02-24 DIAGNOSIS — D638 Anemia in other chronic diseases classified elsewhere: Secondary | ICD-10-CM | POA: Diagnosis not present

## 2021-02-24 DIAGNOSIS — E785 Hyperlipidemia, unspecified: Secondary | ICD-10-CM | POA: Diagnosis not present

## 2021-02-24 DIAGNOSIS — R54 Age-related physical debility: Secondary | ICD-10-CM | POA: Diagnosis not present

## 2021-02-24 DIAGNOSIS — R4182 Altered mental status, unspecified: Secondary | ICD-10-CM | POA: Diagnosis not present

## 2021-02-24 DIAGNOSIS — E1122 Type 2 diabetes mellitus with diabetic chronic kidney disease: Secondary | ICD-10-CM | POA: Diagnosis not present

## 2021-02-24 DIAGNOSIS — R778 Other specified abnormalities of plasma proteins: Secondary | ICD-10-CM | POA: Diagnosis not present

## 2021-02-24 DIAGNOSIS — R0902 Hypoxemia: Secondary | ICD-10-CM | POA: Diagnosis not present

## 2021-02-24 DIAGNOSIS — Z7401 Bed confinement status: Secondary | ICD-10-CM | POA: Diagnosis not present

## 2021-02-24 DIAGNOSIS — R531 Weakness: Secondary | ICD-10-CM | POA: Diagnosis not present

## 2021-02-24 DIAGNOSIS — R1311 Dysphagia, oral phase: Secondary | ICD-10-CM | POA: Diagnosis not present

## 2021-02-24 DIAGNOSIS — I130 Hypertensive heart and chronic kidney disease with heart failure and stage 1 through stage 4 chronic kidney disease, or unspecified chronic kidney disease: Secondary | ICD-10-CM | POA: Diagnosis not present

## 2021-02-24 DIAGNOSIS — Z741 Need for assistance with personal care: Secondary | ICD-10-CM | POA: Diagnosis not present

## 2021-02-24 DIAGNOSIS — N183 Chronic kidney disease, stage 3 unspecified: Secondary | ICD-10-CM | POA: Diagnosis not present

## 2021-02-24 DIAGNOSIS — R404 Transient alteration of awareness: Secondary | ICD-10-CM | POA: Diagnosis not present

## 2021-02-24 DIAGNOSIS — Z743 Need for continuous supervision: Secondary | ICD-10-CM | POA: Diagnosis not present

## 2021-02-24 DIAGNOSIS — I10 Essential (primary) hypertension: Secondary | ICD-10-CM | POA: Diagnosis not present

## 2021-02-24 DIAGNOSIS — D649 Anemia, unspecified: Secondary | ICD-10-CM | POA: Diagnosis not present

## 2021-02-24 DIAGNOSIS — R2681 Unsteadiness on feet: Secondary | ICD-10-CM | POA: Diagnosis not present

## 2021-02-24 DIAGNOSIS — R739 Hyperglycemia, unspecified: Secondary | ICD-10-CM | POA: Diagnosis not present

## 2021-02-24 DIAGNOSIS — J189 Pneumonia, unspecified organism: Secondary | ICD-10-CM | POA: Diagnosis not present

## 2021-02-24 DIAGNOSIS — R296 Repeated falls: Secondary | ICD-10-CM | POA: Diagnosis not present

## 2021-02-24 DIAGNOSIS — M6259 Muscle wasting and atrophy, not elsewhere classified, multiple sites: Secondary | ICD-10-CM | POA: Diagnosis not present

## 2021-02-24 DIAGNOSIS — U071 COVID-19: Secondary | ICD-10-CM | POA: Diagnosis not present

## 2021-02-24 DIAGNOSIS — G9340 Encephalopathy, unspecified: Secondary | ICD-10-CM | POA: Diagnosis not present

## 2021-02-24 LAB — GLUCOSE, CAPILLARY
Glucose-Capillary: 187 mg/dL — ABNORMAL HIGH (ref 70–99)
Glucose-Capillary: 239 mg/dL — ABNORMAL HIGH (ref 70–99)
Glucose-Capillary: 181 mg/dL — ABNORMAL HIGH (ref 70–99)
Glucose-Capillary: 268 mg/dL — ABNORMAL HIGH (ref 70–99)

## 2021-02-24 LAB — SARS CORONAVIRUS 2 (TAT 6-24 HRS): SARS Coronavirus 2: NEGATIVE

## 2021-02-24 MED ORDER — ACETAMINOPHEN 325 MG PO TABS: 650.0000 mg | ORAL_TABLET | Freq: Four times a day (QID) | ORAL | Status: DC | PRN

## 2021-02-24 MED ORDER — INSULIN ASPART 100 UNIT/ML IJ SOLN: 0.0000 [IU] | Freq: Three times a day (TID) | INTRAMUSCULAR | 11 refills | Status: DC

## 2021-02-24 MED ORDER — QUETIAPINE FUMARATE 25 MG PO TABS: 25.0000 mg | ORAL_TABLET | Freq: Every day | ORAL | Status: DC

## 2021-02-24 MED ORDER — ENSURE ENLIVE PO LIQD: 237.0000 mL | Freq: Two times a day (BID) | ORAL | 12 refills | Status: DC

## 2021-02-24 MED ORDER — POLYETHYLENE GLYCOL 3350 17 G PO PACK: 17.0000 g | PACK | Freq: Every day | ORAL | 0 refills | Status: DC | PRN

## 2021-02-24 MED ORDER — CARVEDILOL 3.125 MG PO TABS: 3.1250 mg | ORAL_TABLET | Freq: Two times a day (BID) | ORAL | Status: DC

## 2021-02-24 MED ORDER — AMLODIPINE BESYLATE 10 MG PO TABS: 10.0000 mg | ORAL_TABLET | Freq: Every day | ORAL | Status: DC

## 2021-02-24 NOTE — Discharge Instructions (Signed)
Follow with Primary MD / SNF physician  Get CBC, CMP, 2 view Chest X ray checked  by Primary MD next visit.    Activity: As tolerated with Full fall precautions use walker/cane & assistance as needed   Disposition Home    Diet: Regular diet with thin fluids   On your next visit with your primary care physician please Get Medicines reviewed and adjusted.   Please request your Prim.MD to go over all Hospital Tests and Procedure/Radiological results at the follow up, please get all Hospital records sent to your Prim MD by signing hospital release before you go home.   If you experience worsening of your admission symptoms, develop shortness of breath, life threatening emergency, suicidal or homicidal thoughts you must seek medical attention immediately by calling 911 or calling your MD immediately  if symptoms less severe.  You Must read complete instructions/literature along with all the possible adverse reactions/side effects for all the Medicines you take and that have been prescribed to you. Take any new Medicines after you have completely understood and accpet all the possible adverse reactions/side effects.   Do not drive, operating heavy machinery, perform activities at heights, swimming or participation in water activities or provide baby sitting services if your were admitted for syncope or siezures until you have seen by Primary MD or a Neurologist and advised to do so again.  Do not drive when taking Pain medications.    Do not take more than prescribed Pain, Sleep and Anxiety Medications  Special Instructions: If you have smoked or chewed Tobacco  in the last 2 yrs please stop smoking, stop any regular Alcohol  and or any Recreational drug use.  Wear Seat belts while driving.   Please note  You were cared for by a hospitalist during your hospital stay. If you have any questions about your discharge medications or the care you received while you were in the hospital  after you are discharged, you can call the unit and asked to speak with the hospitalist on call if the hospitalist that took care of you is not available. Once you are discharged, your primary care physician will handle any further medical issues. Please note that NO REFILLS for any discharge medications will be authorized once you are discharged, as it is imperative that you return to your primary care physician (or establish a relationship with a primary care physician if you do not have one) for your aftercare needs so that they can reassess your need for medications and monitor your lab values.

## 2021-02-24 NOTE — Discharge Summary (Addendum)
Physician Discharge Summary  Tyler [name] LZJ:[id number] DOB: [birth year] DOA: 02/11/2021  PCP: [name], MD  Admit date: 02/11/2021 Discharge date: 02/24/2021  Admitted From: Home Disposition: SNF [facility] resources  Recommendations for Outpatient Follow-up:  Follow up with PCP in 1-2 weeks Please obtain BMP/CBC in one week Please follow up on the following pending results:   Discharge Condition:Stable CODE STATUS:DNR Diet recommendation: Patient is currently on regular diet oral intake and appetite, this can be changed to heart healthy/carb modified if needed  Brief/Interim Summary:   Tyler [name] is a 87 y.o. male with medical history significant for type 2 diabetes mellitus, essential hypertension, hyperlipidemia, stage IIIa chronic kidney disease with baseline creatinine reported 1.7, anemia of chronic disease with baseline hemoglobin 9-12, who presented from home to [facility] ED for evaluation of altered mental status, had Asp. PNA.   Acute Toxic encephalopathy due to aspiration pneumonia and  delirium /sepsis ruled out -Patient with aspiration for pneumonia, this has been treated with IV Unasyn, seen by speech, he is currently back on regular diet  -Altered mental status thought to be secondary to infectious process with aspiration of pneumonia, and hospital delirium , Head CT & MRI are unremarkable and he has no focal neurological deficits.  He does not have sepsis.   -Hospital delirium has significantly improved .  Weakness and deconditioning  -PT/OT following, plan for subacute rehab   Insignificant mild elevation in troponin patient ACS pattern.  Chest pain-free.  EKG is nonacute, continue statin and Plavix for secondary prevention.  No further work-up.  Mild troponin rise likely underlying infection.   Essential hypertension.  On Norvasc + Coreg.   Dyslipidemia.  Statin.   CKD 3B.  Baseline creatinine around 1.5.  Stable.  Monitor .   GERD.  Placed on PPI   AOCD.   Stable no acute issue.   History of CVA in the past.  No new focal deficits, head CT stable, no headache, continue Plavix and statin for secondary prevention.   Hypomagnesemia.  Replaced.     DM type II.  Usually on glimepiride, he will be discharged to SNF with sliding scale.  Discharge Diagnoses:  Principal Problem:   Acute metabolic encephalopathy Active Problems:   Type 2 diabetes mellitus without complication, without long-term current use of insulin (HCC)   Hyperlipidemia   Essential hypertension   Anemia of chronic disease   CKD (chronic kidney disease) stage 3, GFR 30-59 ml/min (HCC)   Severe sepsis (HCC)   Community acquired pneumonia   Generalized weakness   Elevated troponin   Goals of care, counseling/discussion    Discharge Instructions  Discharge Instructions     Discharge instructions   Complete by: As directed    Follow with Primary MD / SNF physician  Get CBC, CMP, 2 view Chest X ray checked  by Primary MD next visit.    Activity: As tolerated with Full fall precautions use walker/cane & assistance as needed   Disposition Home    Diet: Regular diet with thin fluids   On your next visit with your primary care physician please Get Medicines reviewed and adjusted.   Please request your Prim.MD to go over all Hospital Tests and Procedure/Radiological results at the follow up, please get all Hospital records sent to your Prim MD by signing hospital release before you go home.   If you experience worsening of your admission symptoms, develop shortness of breath, life threatening emergency, suicidal or homicidal thoughts you must seek medical  attention immediately by calling 911 or calling your MD immediately  if symptoms less severe.  You Must read complete instructions/literature along with all the possible adverse reactions/side effects for all the Medicines you take and that have been prescribed to you. Take any new Medicines after you have  completely understood and accpet all the possible adverse reactions/side effects.   Do not drive, operating heavy machinery, perform activities at heights, swimming or participation in water activities or provide baby sitting services if your were admitted for syncope or siezures until you have seen by Primary MD or a Neurologist and advised to do so again.  Do not drive when taking Pain medications.    Do not take more than prescribed Pain, Sleep and Anxiety Medications  Special Instructions: If you have smoked or chewed Tobacco  in the last 2 yrs please stop smoking, stop any regular Alcohol  and or any Recreational drug use.  Wear Seat belts while driving.   Please note  You were cared for by a hospitalist during your hospital stay. If you have any questions about your discharge medications or the care you received while you were in the hospital after you are discharged, you can call the unit and asked to speak with the hospitalist on call if the hospitalist that took care of you is not available. Once you are discharged, your primary care physician will handle any further medical issues. Please note that NO REFILLS for any discharge medications will be authorized once you are discharged, as it is imperative that you return to your primary care physician (or establish a relationship with a primary care physician if you do not have one) for your aftercare needs so that they can reassess your need for medications and monitor your lab values.   Increase activity slowly   Complete by: As directed       Allergies as of 02/24/2021       Reactions   Sulfa Drugs Cross Reactors Anaphylaxis, Rash   Lungs fill with fluid   Metformin Hcl Nausea Only        Medication List     STOP taking these medications    camphor-menthol lotion Commonly known as: SARNA   carboxymethylcellulose 0.5 % Soln Commonly known as: REFRESH PLUS   eucerin cream   glimepiride 2 MG tablet Commonly known  as: AMARYL   melatonin 5 MG Tabs   mirtazapine 15 MG tablet Commonly known as: REMERON   Omega 3 1000 MG Caps   ONE TOUCH ULTRA TEST test strip Generic drug: glucose blood   onetouch ultrasoft lancets   sertraline 50 MG tablet Commonly known as: ZOLOFT       TAKE these medications    acetaminophen 650 MG CR tablet Commonly known as: TYLENOL Take 650-1,300 mg by mouth 2 (two) times daily. What changed: Another medication with the same name was added. Make sure you understand how and when to take each.   acetaminophen 325 MG tablet Commonly known as: TYLENOL Take 2 tablets (650 mg total) by mouth every 6 (six) hours as needed for mild pain (or Fever >/= 101). What changed: You were already taking a medication with the same name, and this prescription was added. Make sure you understand how and when to take each.   amLODipine 10 MG tablet Commonly known as: NORVASC Take 1 tablet (10 mg total) by mouth daily. Start taking on: February 25, 2021 What changed:  medication strength how much to take when to take  this   atorvastatin 40 MG tablet Commonly known as: LIPITOR Take 40 mg by mouth daily.   carvedilol 3.125 MG tablet Commonly known as: COREG Take 1 tablet (3.125 mg total) by mouth 2 (two) times daily with a meal.   clopidogrel 75 MG tablet Commonly known as: PLAVIX Take 1 tablet (75 mg total) by mouth daily with breakfast.   docusate sodium 100 MG capsule Commonly known as: COLACE Take 100 mg by mouth at bedtime.   feeding supplement Liqd Take 237 mLs by mouth 2 (two) times daily between meals.   insulin aspart 100 UNIT/ML injection Commonly known as: novoLOG Inject 0-6 Units into the skin 3 (three) times daily with meals.   pantoprazole 40 MG tablet Commonly known as: PROTONIX Take 1 tablet (40 mg total) by mouth daily. What changed: when to take this   polyethylene glycol 17 g packet Commonly known as: MIRALAX / GLYCOLAX Take 17 g by mouth daily  as needed for mild constipation.   QUEtiapine 25 MG tablet Commonly known as: SEROQUEL Take 1 tablet (25 mg total) by mouth at bedtime.   vitamin B-12 1000 MCG tablet Commonly known as: CYANOCOBALAMIN Take 1,000 mcg by mouth at bedtime.   Vitamin D3 50 MCG (2000 UT) Tabs Take 2,000 Units by mouth daily.               Durable Medical Equipment  (From admission, onward)           Start     Ordered   02/16/21 0734  For home use only DME Walker rolling  Once       Comments: 5 wheel  Question Answer Comment  Walker: With 5 Inch Wheels   Patient needs a walker to treat with the following condition Weakness      02/16/21 0733            Contact information for after-discharge care     Destination     [facility] Preferred [facility]F .   Service: Skilled Nursing Contact information: [place] [address] [phone]                    Allergies  Allergen Reactions   Sulfa Drugs Cross Reactors Anaphylaxis and Rash    Lungs fill with fluid   Metformin Hcl Nausea Only    Consultations: None   Procedures/Studies: CT HEAD WO CONTRAST (5MM)  Result Date: 02/11/2021 CLINICAL DATA:  Weakness, fatigue EXAM: CT HEAD WITHOUT CONTRAST TECHNIQUE: Contiguous axial images were obtained from the base of the skull through the vertex without intravenous contrast. COMPARISON:  10/18/2018 FINDINGS: Brain: Chronic small vessel ischemic changes are again seen throughout the periventricular white matter, stable. No signs of acute infarct or hemorrhage. Lateral ventricles and remaining midline structures are unremarkable. There are no acute extra-axial fluid collections. No mass effect. Vascular: No hyperdense vessel or unexpected calcification. Skull: Normal. Negative for fracture or focal lesion. Sinuses/Orbits: Mild mucosal thickening within the ethmoid air cells. Remaining paranasal sinuses are clear. Other: None. IMPRESSION:  1. Stable chronic ischemic changes as above. No acute intracranial process. Electronically Signed   By: Randa Ngo M.D.   On: 02/11/2021 20:44   MR BRAIN WO CONTRAST  Result Date: 02/12/2021 CLINICAL DATA:  Acute neurologic deficit EXAM: MRI HEAD WITHOUT CONTRAST TECHNIQUE: Multiplanar, multiecho pulse sequences of the brain and surrounding structures were obtained without intravenous contrast. COMPARISON:  None. FINDINGS: Brain: No acute infarct, mass effect or  extra-axial collection. No acute or chronic hemorrhage. There is multifocal hyperintense T2-weighted signal within the white matter. Diffuse, severe atrophy. The midline structures are normal. Vascular: Major flow voids are preserved. Skull and upper cervical spine: Normal calvarium and skull base. Visualized upper cervical spine and soft tissues are normal. Sinuses/Orbits:No paranasal sinus fluid levels or advanced mucosal thickening. No mastoid or middle ear effusion. Normal orbits. IMPRESSION: 1. No acute intracranial abnormality. 2. Diffuse, severe atrophy and findings of chronic microvascular ischemia. Electronically Signed   By: Ulyses Jarred M.D.   On: 02/12/2021 23:33   CT ABDOMEN PELVIS W CONTRAST  Result Date: 02/11/2021 CLINICAL DATA:  Nausea, vomiting, anorexia, oliguria. Abdominal pain and back pain. EXAM: CT ABDOMEN AND PELVIS WITH CONTRAST TECHNIQUE: Multidetector CT imaging of the abdomen and pelvis was performed using the standard protocol following bolus administration of intravenous contrast. CONTRAST:  3mL OMNIPAQUE IOHEXOL 350 MG/ML SOLN COMPARISON:  07/12/2020 FINDINGS: Lower chest: Patchy infiltrate is seen within the a posterior basal right lower lobe, possibly infectious or inflammatory in nature. Mild left basilar atelectasis. No pleural effusion. Cardiac size within normal limits. No pericardial effusion. Hepatobiliary: No focal liver abnormality is seen. Status post cholecystectomy. No biliary dilatation. Pancreas:  Unremarkable Spleen: Unremarkable Adrenals/Urinary Tract: The adrenal glands are unremarkable. The kidneys are normal in size and position. Stable 7 mm nonobstructing calculus within the lower pole the left kidney. No ureteral calculi. No hydronephrosis. Multiple simple bilateral cortical cysts are again identified. There is a stable 20 mm partially exophytic low-attenuation lesion arising from the upper pole of the right kidney which may represent a hyperdense renal cyst or cystic renal mass, but is not well characterized on this single-phase examination. This appears stable, however, since remote prior examination of 01/22/2015 and its stability over time favors a benign lesion. The bladder is unremarkable. Stomach/Bowel: Stomach is within normal limits. Appendix appears normal. No evidence of bowel wall thickening, distention, or inflammatory changes. Vascular/Lymphatic: Aortic atherosclerosis. No enlarged abdominal or pelvic lymph nodes. Reproductive: Prostatectomy and pelvic lymph node dissection has been performed. No recurrent mass lesion. Other: No abdominal wall hernia.  Rectum unremarkable. Musculoskeletal: Bilateral hip ORIF has been performed. Healed fractures the right superior and inferior pubic rami noted. Degenerative changes are seen within the lumbar spine. No acute bone abnormality. No lytic or blastic bone lesion. IMPRESSION: No acute intra-abdominal pathology identified. No definite radiographic explanation for the patient's reported symptoms. Patchy infiltrate within the visualized right lung base, possibly infectious or inflammatory. Stable mild left nonobstructing nephrolithiasis. Indeterminate low-attenuation exophytic lesion arising from the upper pole of the right kidney, stable since prior examination of 01/22/2015. Its stability over time, however, favors a hyperdense renal cyst. Aortic Atherosclerosis (ICD10-I70.0). Electronically Signed   By: Fidela Salisbury M.D.   On: 02/11/2021 20:55    DG Chest Port 1 View  Result Date: 02/18/2021 CLINICAL DATA:  87 year old male with shortness of breath. Right upper lobe pneumonia. EXAM: PORTABLE CHEST 1 VIEW COMPARISON:  Portable chest 02/15/2021 and earlier. FINDINGS: Portable AP upright view at 1109 hours. Partial clearing of the asymmetric right upper lobe opacity which continues to abut the minor fissure. Mildly lower lung volumes. Mediastinal contours remain normal. Visualized tracheal air column is within normal limits. No pneumothorax, pulmonary edema, pleural effusion or new pulmonary opacity. Visible bowel-gas pattern within normal limits. Cholecystectomy clips. Osteopenia. No acute osseous abnormality identified. IMPRESSION: Partial clearing of the right upper lobe pneumonia. No new cardiopulmonary abnormality. Electronically Signed   By: Lemmie Evens  [place] Crane M.D.   On: 02/18/2021 11:35   DG Chest Port 1 View  Result Date: 02/15/2021 CLINICAL DATA:  Shortness of breath EXAM: PORTABLE CHEST 1 VIEW COMPARISON:  Four days ago FINDINGS: Right upper lobe airspace disease. There is no edema, effusion, or pneumothorax. Normal heart size and stable mediastinal contours. IMPRESSION: Unchanged right upper lobe pneumonia. Electronically Signed   By: Jorje Guild M.D.   On: 02/15/2021 08:09   DG Chest Portable 1 View  Result Date: 02/11/2021 CLINICAL DATA:  Weakness, chest pain EXAM: PORTABLE CHEST 1 VIEW COMPARISON:  Chest x-ray 10/14/2018 FINDINGS: The heart and mediastinal contours are within normal limits. Vague right perihilar airspace density may be due to rotation. No pulmonary edema. No pleural effusion. No pneumothorax. No acute osseous abnormality. IMPRESSION: Low lung volumes with vague right perihilar airspace density may be due to rotation. Otherwise no acute cardiopulmonary abnormality. Electronically Signed   By: Iven Finn M.D.   On: 02/11/2021 19:15   CT HEAD CODE STROKE WO CONTRAST  Result Date: 02/12/2021 CLINICAL DATA:  Facial  droop and dysarthria EXAM: CT HEAD WITHOUT CONTRAST CT ANGIOGRAPHY OF THE HEAD AND NECK TECHNIQUE: Contiguous axial images were obtained from the base of the skull through the vertex without intravenous contrast. Multidetector CT imaging of the head and neck was performed using the standard protocol during bolus administration of intravenous contrast. Multiplanar CT image reconstructions and MIPs were obtained to evaluate the vascular anatomy. Carotid stenosis measurements (when applicable) are obtained utilizing NASCET criteria, using the distal internal carotid diameter as the denominator. CONTRAST:  64mL OMNIPAQUE IOHEXOL 350 MG/ML SOLN COMPARISON:  None. FINDINGS: CT HEAD FINDINGS Brain: There is no mass, hemorrhage or extra-axial collection. There is generalized atrophy without lobar predilection. There is hypoattenuation of the periventricular white matter, most commonly indicating chronic ischemic microangiopathy. Vascular: No abnormal hyperdensity of the major intracranial arteries or dural venous sinuses. No intracranial atherosclerosis. Skull: The visualized skull base, calvarium and extracranial soft tissues are normal. Sinuses/Orbits: No fluid levels or advanced mucosal thickening of the visualized paranasal sinuses. No mastoid or middle ear effusion. The orbits are normal. ASPECTS ([place] Stroke Program Early CT Score) - Ganglionic level infarction (caudate, lentiform nuclei, internal capsule, insula, M1-M3 cortex): 7 - Supraganglionic infarction (M4-M6 cortex): 3 Total score (0-10 with 10 being normal): 10 CTA NECK FINDINGS SKELETON: There is no bony spinal canal stenosis. No lytic or blastic lesion. OTHER NECK: Subcentimeter right intraparotid lymph node. Normal left parotid gland. 8 mm right submandibular lymph node. Submandibular glands are normal. UPPER CHEST: Right upper lobe consolidation. AORTIC ARCH: There is no calcific atherosclerosis of the aortic arch. There is no aneurysm, dissection or  hemodynamically significant stenosis of the visualized portion of the aorta. Conventional 3 vessel aortic branching pattern. The visualized proximal subclavian arteries are widely patent. RIGHT CAROTID SYSTEM: Normal without aneurysm, dissection or stenosis. LEFT CAROTID SYSTEM: Normal without aneurysm, dissection or stenosis. VERTEBRAL ARTERIES: Left dominant configuration. Both origins are clearly patent. There is no dissection, occlusion or flow-limiting stenosis to the skull base (V1-V3 segments). CTA HEAD FINDINGS POSTERIOR CIRCULATION: --Vertebral arteries: Normal V4 segments. --Inferior cerebellar arteries: Normal. --Basilar artery: Normal. --Superior cerebellar arteries: Normal. --Posterior cerebral arteries (PCA): Normal. ANTERIOR CIRCULATION: --Intracranial internal carotid arteries: Normal. --Anterior cerebral arteries (ACA): Normal. Both A1 segments are present. Patent anterior communicating artery (a-comm). --Middle cerebral arteries (MCA): Normal. VENOUS SINUSES: As permitted by contrast timing, patent. ANATOMIC VARIANTS: None Review of the MIP images confirms the above findings. IMPRESSION:  1. No emergent large vessel occlusion or high-grade stenosis of the head or neck. 2. Right upper lobe consolidation, concerning for pneumonia. These results were called by telephone at the time of interpretation on 02/12/2021 at 9:14 pm to provider [facility] , who verbally acknowledged these results. Electronically Signed   By: Ulyses Jarred M.D.   On: 02/12/2021 21:14   CT ANGIO HEAD CODE STROKE  Result Date: 02/12/2021 CLINICAL DATA:  Facial droop and dysarthria EXAM: CT HEAD WITHOUT CONTRAST CT ANGIOGRAPHY OF THE HEAD AND NECK TECHNIQUE: Contiguous axial images were obtained from the base of the skull through the vertex without intravenous contrast. Multidetector CT imaging of the head and neck was performed using the standard protocol during bolus administration of intravenous contrast. Multiplanar  CT image reconstructions and MIPs were obtained to evaluate the vascular anatomy. Carotid stenosis measurements (when applicable) are obtained utilizing NASCET criteria, using the distal internal carotid diameter as the denominator. CONTRAST:  54mL OMNIPAQUE IOHEXOL 350 MG/ML SOLN COMPARISON:  None. FINDINGS: CT HEAD FINDINGS Brain: There is no mass, hemorrhage or extra-axial collection. There is generalized atrophy without lobar predilection. There is hypoattenuation of the periventricular white matter, most commonly indicating chronic ischemic microangiopathy. Vascular: No abnormal hyperdensity of the major intracranial arteries or dural venous sinuses. No intracranial atherosclerosis. Skull: The visualized skull base, calvarium and extracranial soft tissues are normal. Sinuses/Orbits: No fluid levels or advanced mucosal thickening of the visualized paranasal sinuses. No mastoid or middle ear effusion. The orbits are normal. ASPECTS ([place] Stroke Program Early CT Score) - Ganglionic level infarction (caudate, lentiform nuclei, internal capsule, insula, M1-M3 cortex): 7 - Supraganglionic infarction (M4-M6 cortex): 3 Total score (0-10 with 10 being normal): 10 CTA NECK FINDINGS SKELETON: There is no bony spinal canal stenosis. No lytic or blastic lesion. OTHER NECK: Subcentimeter right intraparotid lymph node. Normal left parotid gland. 8 mm right submandibular lymph node. Submandibular glands are normal. UPPER CHEST: Right upper lobe consolidation. AORTIC ARCH: There is no calcific atherosclerosis of the aortic arch. There is no aneurysm, dissection or hemodynamically significant stenosis of the visualized portion of the aorta. Conventional 3 vessel aortic branching pattern. The visualized proximal subclavian arteries are widely patent. RIGHT CAROTID SYSTEM: Normal without aneurysm, dissection or stenosis. LEFT CAROTID SYSTEM: Normal without aneurysm, dissection or stenosis. VERTEBRAL ARTERIES: Left dominant  configuration. Both origins are clearly patent. There is no dissection, occlusion or flow-limiting stenosis to the skull base (V1-V3 segments). CTA HEAD FINDINGS POSTERIOR CIRCULATION: --Vertebral arteries: Normal V4 segments. --Inferior cerebellar arteries: Normal. --Basilar artery: Normal. --Superior cerebellar arteries: Normal. --Posterior cerebral arteries (PCA): Normal. ANTERIOR CIRCULATION: --Intracranial internal carotid arteries: Normal. --Anterior cerebral arteries (ACA): Normal. Both A1 segments are present. Patent anterior communicating artery (a-comm). --Middle cerebral arteries (MCA): Normal. VENOUS SINUSES: As permitted by contrast timing, patent. ANATOMIC VARIANTS: None Review of the MIP images confirms the above findings. IMPRESSION: 1. No emergent large vessel occlusion or high-grade stenosis of the head or neck. 2. Right upper lobe consolidation, concerning for pneumonia. These results were called by telephone at the time of interpretation on 02/12/2021 at 9:14 pm to provider [facility] , who verbally acknowledged these results. Electronically Signed   By: Ulyses Jarred M.D.   On: 02/12/2021 21:14   CT ANGIO NECK CODE STROKE  Result Date: 02/12/2021 CLINICAL DATA:  Facial droop and dysarthria EXAM: CT HEAD WITHOUT CONTRAST CT ANGIOGRAPHY OF THE HEAD AND NECK TECHNIQUE: Contiguous axial images were obtained from the base of the skull through the  vertex without intravenous contrast. Multidetector CT imaging of the head and neck was performed using the standard protocol during bolus administration of intravenous contrast. Multiplanar CT image reconstructions and MIPs were obtained to evaluate the vascular anatomy. Carotid stenosis measurements (when applicable) are obtained utilizing NASCET criteria, using the distal internal carotid diameter as the denominator. CONTRAST:  48mL OMNIPAQUE IOHEXOL 350 MG/ML SOLN COMPARISON:  None. FINDINGS: CT HEAD FINDINGS Brain: There is no mass,  hemorrhage or extra-axial collection. There is generalized atrophy without lobar predilection. There is hypoattenuation of the periventricular white matter, most commonly indicating chronic ischemic microangiopathy. Vascular: No abnormal hyperdensity of the major intracranial arteries or dural venous sinuses. No intracranial atherosclerosis. Skull: The visualized skull base, calvarium and extracranial soft tissues are normal. Sinuses/Orbits: No fluid levels or advanced mucosal thickening of the visualized paranasal sinuses. No mastoid or middle ear effusion. The orbits are normal. ASPECTS ([place] Stroke Program Early CT Score) - Ganglionic level infarction (caudate, lentiform nuclei, internal capsule, insula, M1-M3 cortex): 7 - Supraganglionic infarction (M4-M6 cortex): 3 Total score (0-10 with 10 being normal): 10 CTA NECK FINDINGS SKELETON: There is no bony spinal canal stenosis. No lytic or blastic lesion. OTHER NECK: Subcentimeter right intraparotid lymph node. Normal left parotid gland. 8 mm right submandibular lymph node. Submandibular glands are normal. UPPER CHEST: Right upper lobe consolidation. AORTIC ARCH: There is no calcific atherosclerosis of the aortic arch. There is no aneurysm, dissection or hemodynamically significant stenosis of the visualized portion of the aorta. Conventional 3 vessel aortic branching pattern. The visualized proximal subclavian arteries are widely patent. RIGHT CAROTID SYSTEM: Normal without aneurysm, dissection or stenosis. LEFT CAROTID SYSTEM: Normal without aneurysm, dissection or stenosis. VERTEBRAL ARTERIES: Left dominant configuration. Both origins are clearly patent. There is no dissection, occlusion or flow-limiting stenosis to the skull base (V1-V3 segments). CTA HEAD FINDINGS POSTERIOR CIRCULATION: --Vertebral arteries: Normal V4 segments. --Inferior cerebellar arteries: Normal. --Basilar artery: Normal. --Superior cerebellar arteries: Normal. --Posterior cerebral  arteries (PCA): Normal. ANTERIOR CIRCULATION: --Intracranial internal carotid arteries: Normal. --Anterior cerebral arteries (ACA): Normal. Both A1 segments are present. Patent anterior communicating artery (a-comm). --Middle cerebral arteries (MCA): Normal. VENOUS SINUSES: As permitted by contrast timing, patent. ANATOMIC VARIANTS: None Review of the MIP images confirms the above findings. IMPRESSION: 1. No emergent large vessel occlusion or high-grade stenosis of the head or neck. 2. Right upper lobe consolidation, concerning for pneumonia. These results were called by telephone at the time of interpretation on 02/12/2021 at 9:14 pm to provider [facility] , who verbally acknowledged these results. Electronically Signed   By: Ulyses Jarred M.D.   On: 02/12/2021 21:14      Subjective:  No significant events overnight as discussed with staff  Discharge Exam: Vitals:   02/24/21 0429 02/24/21 0737  BP: (!) 163/62 (!) 150/67  Pulse: 65 63  Resp: 16 16  Temp: 98.2 F (36.8 C) 98.2 F (36.8 C)  SpO2: 99% 99%   Vitals:   02/23/21 2026 02/23/21 2341 02/24/21 0429 02/24/21 0737  BP: (!) 150/58 (!) 122/53 (!) 163/62 (!) 150/67  Pulse: 63 60 65 63  Resp: 16 18 16 16   Temp: 98.9 F (37.2 C) (!) 97.3 F (36.3 C) 98.2 F (36.8 C) 98.2 F (36.8 C)  TempSrc: Oral Oral Oral Axillary  SpO2: 97% 98% 99% 99%  Weight:      Height:        General: Pt is awake, in no apparent distress  cardiovascular: RRR, S1/S2 +, no rubs, no  gallops Respiratory: CTA bilaterally, no wheezing, no rhonchi Abdominal: Soft, NT, ND, bowel sounds + Extremities: no edema, no cyanosis    The results of significant diagnostics from this hospitalization (including imaging, microbiology, ancillary and laboratory) are listed below for reference.     Microbiology: No results found for this or any previous visit (from the past 240 hour(s)).   Labs: BNP (last 3 results) Recent Labs    02/14/21 0309  02/15/21 0449 02/16/21 0354  BNP 375.9* 165.9* 786.7*   Basic Metabolic Panel: Recent Labs  Lab 02/20/21 0246  NA 137  K 3.8  CL 104  CO2 24  GLUCOSE 146*  BUN 22  CREATININE 1.49*  CALCIUM 9.0   Liver Function Tests: No results for input(s): AST, ALT, ALKPHOS, BILITOT, PROT, ALBUMIN in the last 168 hours. No results for input(s): LIPASE, AMYLASE in the last 168 hours. No results for input(s): AMMONIA in the last 168 hours. CBC: Recent Labs  Lab 02/20/21 0246  WBC 7.6  HGB 8.7*  HCT 25.9*  MCV 97.7  PLT 223   Cardiac Enzymes: No results for input(s): CKTOTAL, CKMB, CKMBINDEX, TROPONINI in the last 168 hours. BNP: Invalid input(s): POCBNP CBG: Recent Labs  Lab 02/23/21 0622 02/23/21 1200 02/23/21 1701 02/23/21 2158 02/24/21 0652  GLUCAP 177* 290* 203* 185* 187*   D-Dimer No results for input(s): DDIMER in the last 72 hours. Hgb A1c No results for input(s): HGBA1C in the last 72 hours. Lipid Profile No results for input(s): CHOL, HDL, LDLCALC, TRIG, CHOLHDL, LDLDIRECT in the last 72 hours. Thyroid function studies No results for input(s): TSH, T4TOTAL, T3FREE, THYROIDAB in the last 72 hours.  Invalid input(s): FREET3 Anemia work up No results for input(s): VITAMINB12, FOLATE, FERRITIN, TIBC, IRON, RETICCTPCT in the last 72 hours. Urinalysis    Component Value Date/Time   COLORURINE YELLOW 02/11/2021 [place] 02/11/2021 1755   LABSPEC 1.013 02/11/2021 1755   PHURINE 5.0 02/11/2021 1755   GLUCOSEU NEGATIVE 02/11/2021 1755   HGBUR SMALL (A) 02/11/2021 1755   BILIRUBINUR NEGATIVE 02/11/2021 1755   KETONESUR 5 (A) 02/11/2021 1755   PROTEINUR NEGATIVE 02/11/2021 1755   UROBILINOGEN 1.0 03/13/2008 1130   NITRITE NEGATIVE 02/11/2021 1755   LEUKOCYTESUR NEGATIVE 02/11/2021 1755   Sepsis Labs Invalid input(s): PROCALCITONIN,  WBC,  LACTICIDVEN Microbiology No results found for this or any previous visit (from the past 240  hour(s)).   Time coordinating discharge: Over 30 minutes  SIGNED:   [name], MD  [facility] 02/24/2021, 10:23 AM Pager   If 7PM-7AM, please contact night-coverage [email or web address] Password [email or web address]

## 2021-02-24 NOTE — TOC Transition Note (Signed)
Transition of Care [facility]) - CM/SW Discharge Note   Patient Details  Name: DEONDREA MARKOS MRN: [id number] Date of Birth: [birth year]  Transition of Care [facility]) CM/SW Contact:  Geralynn Ochs, LCSW Phone Number: 02/24/2021, 12:13 PM   Clinical Narrative:   Nurse to call report to [phone], Room 701.    Final next level of care: Skilled Nursing Facility Barriers to Discharge: Barriers Resolved   Patient Goals and CMS Choice Patient states their goals for this hospitalization and ongoing recovery are:: patient unable to participate in goal setting, only oriented to self CMS Medicare.gov Compare Post Acute Care list provided to:: Patient Represenative (must comment) Choice offered to / list presented to : Adult Children  Discharge Placement              Patient chooses bed at: [facility]  Patient to be transferred to facility by: [place] Name of family member notified: Kieth Patient and family notified of of transfer: 02/24/21  Discharge Plan and Services     Post Acute Care Choice: NA                               Social Determinants of Health (SDOH) Interventions     Readmission Risk Interventions No flowsheet data found.

## 2021-02-24 NOTE — Progress Notes (Signed)
Physical Therapy Treatment Patient Details Name: Tyler [name] MRN: [id number] DOB: 21-Apr-1929 Today's Date: 02/24/2021   History of Present Illness 87 y.o. male who was admitted with acute metabolic encephalopathy and a PMH for type 2 diabetes mellitus, essential hypertension, hyperlipidemia, stage IIIa chronic kidney disease with baseline creatinine reported 1.7, anemia.    PT Comments    Pt received in supine on bed pan, agreeable to therapy session and with good participation and tolerance for transfer and gait training. Pt hard of hearing and with difficulty safely sequencing functional mobility tasks and benefits from dense multimodal cues for safety with mobility. Pt needing up to modA for gait and transfers using RW and minA for bed mobility. Pt continues to benefit from PT services to progress toward functional mobility goals. Continue to recommend SNF.   Recommendations for follow up therapy are one component of a multi-disciplinary discharge planning process, led by the attending physician.  Recommendations may be updated based on patient status, additional functional criteria and insurance authorization.  Follow Up Recommendations  Skilled nursing-short term rehab (<3 hours/day)     Assistance Recommended at Discharge Frequent or constant Supervision/Assistance  Equipment Recommendations  Other (comment) (defer to post-acute)    Recommendations for Other Services       Precautions / Restrictions Precautions Precautions: Fall Precaution Comments: bring brief- has had episodes of stool incontinence Restrictions Weight Bearing Restrictions: No     Mobility  Bed Mobility Overal bed mobility: Needs Assistance Bed Mobility: Rolling;Sidelying to Sit;Sit to Supine Rolling: Min assist (tcs for LE/UE placement, totalA for pericare) Sidelying to sit: Min assist   Sit to supine: Min assist (Required assistance with lifting legs back onto bed)   General bed mobility comments:  Verbal and tacticle cues required for hand placement on bed rails and therapist assistance needed to get legs back on bed from sitting EOB to supine; increased time/cues to perform    Transfers Overall transfer level: Needs assistance Equipment used: Rolling walker (2 wheels) Transfers: Sit to/from Stand Sit to Stand: Mod assist;+2 safety/equipment           General transfer comment: Verbal cues for upright posture once standing, tacticle cues for hand placement to push off of bed before standing, increased timeto perform; PTA student present with min guard for safety but not needing to lift to assist    Ambulation/Gait Ambulation/Gait assistance: +2 safety/equipment;Mod assist Gait Distance (Feet): 15 Feet Assistive device: Rolling walker (2 wheels) Gait Pattern/deviations: Step-through pattern;Decreased step length - right;Trunk flexed;Wide base of support;Drifts right/left;Ataxic Gait velocity: decreased   General Gait Details: Pt needs tactile cues for RLE advancement during gait, verbal and tactile cues and to maneuver walker in direction needed      Modified Rankin (Stroke Patients Only) Modified Rankin (Stroke Patients Only) Pre-Morbid Rankin Score: Moderate disability Modified Rankin: Moderately severe disability     Balance Overall balance assessment: Needs assistance Sitting-balance support: Bilateral upper extremity supported;Feet supported Sitting balance-Leahy Scale: Fair     Standing balance support: Bilateral upper extremity supported;During functional activity Standing balance-Leahy Scale: Poor Standing balance comment: Verbal and tactile cues for upright posture within RW and needing external support            Cognition Arousal/Alertness: Awake/alert Behavior During Therapy: WFL for tasks assessed/performed;Flat affect Overall Cognitive Status: Impaired/Different from baseline Area of Impairment: Orientation;Attention;Memory;Following  commands;Safety/judgement;Awareness;Problem solving      Orientation Level:  (too HoH to assess fully) Current Attention Level: Sustained Memory: Decreased recall of precautions;Decreased  short-term memory Following Commands: Follows one step commands inconsistently;Follows one step commands with increased time Safety/Judgement: Decreased awareness of safety;Decreased awareness of deficits Awareness: Intellectual Problem Solving: Slow processing;Decreased initiation;Difficulty sequencing;Requires verbal cues;Requires tactile cues General Comments: Pt is cooperative with treatment when he can hear what therapist is asking, very hard of hearing (does slightly better with lower voice tone, maybe), required multimodal cues due to HoH/understands better with gestures           General Comments General comments (skin integrity, edema, etc.): Diffuse rash along back, RN aware      Pertinent Vitals/Pain Pain Assessment: Faces Faces Pain Scale: Hurts a little bit Pain Location: all over back (discomfort more than pain expressed) Pain Descriptors / Indicators: Discomfort Pain Intervention(s): Limited activity within patient's tolerance;Monitored during session;Repositioned (RN notified pt requesting "something for my rash" on his back)     PT Goals (current goals can now be found in the care plan section) Acute Rehab PT Goals Patient Stated Goal: pt unable to state; very HoH PT Goal Formulation: With patient/family Time For Goal Achievement: 03/06/21 Progress towards PT goals: Progressing toward goals    Frequency    Min 3X/week      PT Plan Current plan remains appropriate       AM-PAC PT "6 Clicks" Mobility   Outcome Measure  Help needed turning from your back to your side while in a flat bed without using bedrails?: A Little Help needed moving from lying on your back to sitting on the side of a flat bed without using bedrails?: A Little Help needed moving to and from a bed  to a chair (including a wheelchair)?: A Lot Help needed standing up from a chair using your arms (e.g., wheelchair or bedside chair)?: A Lot Help needed to walk in hospital room?: A Lot Help needed climbing 3-5 steps with a railing? : Total 6 Click Score: 13    End of Session Equipment Utilized During Treatment: Gait belt Activity Tolerance: Patient tolerated treatment well;No increased pain Patient left: in bed;with bed alarm set;with call bell/phone within reach (bed in chair position) Nurse Communication: Mobility status;Other (comment) (pt c/o back rash) PT Visit Diagnosis: Other abnormalities of gait and mobility (R26.89);Muscle weakness (generalized) (M62.81);Unsteadiness on feet (R26.81);Other symptoms and signs involving the nervous system (R29.898);Difficulty in walking, not elsewhere classified (R26.2)     Time: 3810-1751 PT Time Calculation (min) (ACUTE ONLY): 24 min  Charges:  $Gait Training: 8-22 mins $Therapeutic Activity: 8-22 mins                     Reyanne Hussar P., PTA Acute Rehabilitation Services Pager: [phone] Office: [place] 02/24/2021, 4:11 PM

## 2021-02-25 DIAGNOSIS — R296 Repeated falls: Secondary | ICD-10-CM | POA: Diagnosis not present

## 2021-02-25 DIAGNOSIS — J189 Pneumonia, unspecified organism: Secondary | ICD-10-CM | POA: Diagnosis not present

## 2021-02-25 DIAGNOSIS — E785 Hyperlipidemia, unspecified: Secondary | ICD-10-CM | POA: Diagnosis not present

## 2021-02-25 DIAGNOSIS — R54 Age-related physical debility: Secondary | ICD-10-CM | POA: Diagnosis not present

## 2021-02-25 DIAGNOSIS — I130 Hypertensive heart and chronic kidney disease with heart failure and stage 1 through stage 4 chronic kidney disease, or unspecified chronic kidney disease: Secondary | ICD-10-CM | POA: Diagnosis not present

## 2021-02-25 DIAGNOSIS — K2100 Gastro-esophageal reflux disease with esophagitis, without bleeding: Secondary | ICD-10-CM | POA: Diagnosis not present

## 2021-02-25 DIAGNOSIS — E1151 Type 2 diabetes mellitus with diabetic peripheral angiopathy without gangrene: Secondary | ICD-10-CM | POA: Diagnosis not present

## 2021-02-26 DIAGNOSIS — I130 Hypertensive heart and chronic kidney disease with heart failure and stage 1 through stage 4 chronic kidney disease, or unspecified chronic kidney disease: Secondary | ICD-10-CM | POA: Diagnosis not present

## 2021-02-26 DIAGNOSIS — E1151 Type 2 diabetes mellitus with diabetic peripheral angiopathy without gangrene: Secondary | ICD-10-CM | POA: Diagnosis not present

## 2021-02-26 DIAGNOSIS — J189 Pneumonia, unspecified organism: Secondary | ICD-10-CM | POA: Diagnosis not present

## 2021-02-26 DIAGNOSIS — R296 Repeated falls: Secondary | ICD-10-CM | POA: Diagnosis not present

## 2021-02-28 DIAGNOSIS — E1151 Type 2 diabetes mellitus with diabetic peripheral angiopathy without gangrene: Secondary | ICD-10-CM | POA: Diagnosis not present

## 2021-02-28 DIAGNOSIS — I130 Hypertensive heart and chronic kidney disease with heart failure and stage 1 through stage 4 chronic kidney disease, or unspecified chronic kidney disease: Secondary | ICD-10-CM | POA: Diagnosis not present

## 2021-02-28 DIAGNOSIS — R296 Repeated falls: Secondary | ICD-10-CM | POA: Diagnosis not present

## 2021-02-28 DIAGNOSIS — J189 Pneumonia, unspecified organism: Secondary | ICD-10-CM | POA: Diagnosis not present

## 2021-03-03 DIAGNOSIS — N183 Chronic kidney disease, stage 3 unspecified: Secondary | ICD-10-CM | POA: Diagnosis not present

## 2021-03-03 DIAGNOSIS — E1122 Type 2 diabetes mellitus with diabetic chronic kidney disease: Secondary | ICD-10-CM | POA: Diagnosis not present

## 2021-03-03 DIAGNOSIS — E7800 Pure hypercholesterolemia, unspecified: Secondary | ICD-10-CM | POA: Diagnosis not present

## 2021-03-03 DIAGNOSIS — I129 Hypertensive chronic kidney disease with stage 1 through stage 4 chronic kidney disease, or unspecified chronic kidney disease: Secondary | ICD-10-CM | POA: Diagnosis not present

## 2021-03-04 DIAGNOSIS — J189 Pneumonia, unspecified organism: Secondary | ICD-10-CM | POA: Diagnosis not present

## 2021-03-04 DIAGNOSIS — R296 Repeated falls: Secondary | ICD-10-CM | POA: Diagnosis not present

## 2021-03-04 DIAGNOSIS — I130 Hypertensive heart and chronic kidney disease with heart failure and stage 1 through stage 4 chronic kidney disease, or unspecified chronic kidney disease: Secondary | ICD-10-CM | POA: Diagnosis not present

## 2021-03-04 DIAGNOSIS — E1151 Type 2 diabetes mellitus with diabetic peripheral angiopathy without gangrene: Secondary | ICD-10-CM | POA: Diagnosis not present

## 2021-03-05 ENCOUNTER — Ambulatory Visit: Payer: PPO | Admitting: Podiatry

## 2021-03-07 DIAGNOSIS — R296 Repeated falls: Secondary | ICD-10-CM | POA: Diagnosis not present

## 2021-03-07 DIAGNOSIS — J189 Pneumonia, unspecified organism: Secondary | ICD-10-CM | POA: Diagnosis not present

## 2021-03-07 DIAGNOSIS — I130 Hypertensive heart and chronic kidney disease with heart failure and stage 1 through stage 4 chronic kidney disease, or unspecified chronic kidney disease: Secondary | ICD-10-CM | POA: Diagnosis not present

## 2021-03-07 DIAGNOSIS — E1151 Type 2 diabetes mellitus with diabetic peripheral angiopathy without gangrene: Secondary | ICD-10-CM | POA: Diagnosis not present

## 2021-03-11 DIAGNOSIS — R296 Repeated falls: Secondary | ICD-10-CM | POA: Diagnosis not present

## 2021-03-11 DIAGNOSIS — J189 Pneumonia, unspecified organism: Secondary | ICD-10-CM | POA: Diagnosis not present

## 2021-03-13 DIAGNOSIS — E1151 Type 2 diabetes mellitus with diabetic peripheral angiopathy without gangrene: Secondary | ICD-10-CM | POA: Diagnosis not present

## 2021-03-13 DIAGNOSIS — J189 Pneumonia, unspecified organism: Secondary | ICD-10-CM | POA: Diagnosis not present

## 2021-03-13 DIAGNOSIS — R296 Repeated falls: Secondary | ICD-10-CM | POA: Diagnosis not present

## 2021-03-13 DIAGNOSIS — I130 Hypertensive heart and chronic kidney disease with heart failure and stage 1 through stage 4 chronic kidney disease, or unspecified chronic kidney disease: Secondary | ICD-10-CM | POA: Diagnosis not present

## 2021-03-14 DIAGNOSIS — R4182 Altered mental status, unspecified: Secondary | ICD-10-CM | POA: Diagnosis not present

## 2021-04-03 DEATH — deceased
